# Patient Record
Sex: Female | Born: 1935 | Race: Black or African American | Hispanic: No | Smoking: Never smoker
Health system: Southern US, Community
[De-identification: ages and names within clinical notes are randomized; demographics above are authoritative.]

## PROBLEM LIST (undated history)

## (undated) DIAGNOSIS — Z5189 Encounter for other specified aftercare: Secondary | ICD-10-CM

## (undated) DIAGNOSIS — F419 Anxiety disorder, unspecified: Secondary | ICD-10-CM

## (undated) DIAGNOSIS — G459 Transient cerebral ischemic attack, unspecified: Secondary | ICD-10-CM

## (undated) DIAGNOSIS — I639 Cerebral infarction, unspecified: Secondary | ICD-10-CM

## (undated) DIAGNOSIS — IMO0001 Reserved for inherently not codable concepts without codable children: Secondary | ICD-10-CM

## (undated) DIAGNOSIS — I10 Essential (primary) hypertension: Secondary | ICD-10-CM

## (undated) DIAGNOSIS — R011 Cardiac murmur, unspecified: Secondary | ICD-10-CM

## (undated) DIAGNOSIS — R609 Edema, unspecified: Secondary | ICD-10-CM

## (undated) DIAGNOSIS — E785 Hyperlipidemia, unspecified: Secondary | ICD-10-CM

## (undated) DIAGNOSIS — M1990 Unspecified osteoarthritis, unspecified site: Secondary | ICD-10-CM

## (undated) DIAGNOSIS — F329 Major depressive disorder, single episode, unspecified: Secondary | ICD-10-CM

## (undated) DIAGNOSIS — G4730 Sleep apnea, unspecified: Secondary | ICD-10-CM

## (undated) DIAGNOSIS — F32A Depression, unspecified: Secondary | ICD-10-CM

## (undated) HISTORY — PX: NM MYOVIEW LTD: HXRAD82

## (undated) HISTORY — DX: Hyperlipidemia, unspecified: E78.5

## (undated) HISTORY — PX: ABDOMINAL HYSTERECTOMY: SHX81

## (undated) HISTORY — PX: PARTIAL HYSTERECTOMY: SHX80

## (undated) HISTORY — PX: APPENDECTOMY: SHX54

## (undated) HISTORY — DX: Edema, unspecified: R60.9

## (undated) HISTORY — PX: TRANSTHORACIC ECHOCARDIOGRAM: SHX275

---

## 1998-08-22 ENCOUNTER — Encounter: Payer: Self-pay | Admitting: Cardiovascular Disease

## 1998-08-22 ENCOUNTER — Inpatient Hospital Stay (HOSPITAL_COMMUNITY): Admission: EM | Admit: 1998-08-22 | Discharge: 1998-08-24 | Payer: Self-pay | Admitting: Emergency Medicine

## 1999-03-22 ENCOUNTER — Ambulatory Visit (HOSPITAL_COMMUNITY): Admission: RE | Admit: 1999-03-22 | Discharge: 1999-03-22 | Payer: Self-pay | Admitting: Gastroenterology

## 1999-04-18 ENCOUNTER — Other Ambulatory Visit: Admission: RE | Admit: 1999-04-18 | Discharge: 1999-04-18 | Payer: Self-pay | Admitting: Obstetrics and Gynecology

## 1999-10-05 ENCOUNTER — Ambulatory Visit (HOSPITAL_COMMUNITY): Admission: RE | Admit: 1999-10-05 | Discharge: 1999-10-05 | Payer: Self-pay | Admitting: Gastroenterology

## 2000-01-04 ENCOUNTER — Encounter: Payer: Self-pay | Admitting: Internal Medicine

## 2000-01-04 ENCOUNTER — Encounter: Admission: RE | Admit: 2000-01-04 | Discharge: 2000-01-04 | Payer: Self-pay | Admitting: Internal Medicine

## 2000-01-07 ENCOUNTER — Emergency Department (HOSPITAL_COMMUNITY): Admission: EM | Admit: 2000-01-07 | Discharge: 2000-01-07 | Payer: Self-pay | Admitting: Emergency Medicine

## 2000-06-06 ENCOUNTER — Emergency Department (HOSPITAL_COMMUNITY): Admission: EM | Admit: 2000-06-06 | Discharge: 2000-06-06 | Payer: Self-pay | Admitting: Emergency Medicine

## 2001-01-31 ENCOUNTER — Encounter: Payer: Self-pay | Admitting: Internal Medicine

## 2001-01-31 ENCOUNTER — Encounter: Admission: RE | Admit: 2001-01-31 | Discharge: 2001-01-31 | Payer: Self-pay | Admitting: Internal Medicine

## 2001-05-02 ENCOUNTER — Ambulatory Visit (HOSPITAL_COMMUNITY): Admission: RE | Admit: 2001-05-02 | Discharge: 2001-05-02 | Payer: Self-pay | Admitting: Internal Medicine

## 2001-09-06 ENCOUNTER — Emergency Department (HOSPITAL_COMMUNITY): Admission: EM | Admit: 2001-09-06 | Discharge: 2001-09-06 | Payer: Self-pay | Admitting: Emergency Medicine

## 2001-09-06 ENCOUNTER — Encounter: Payer: Self-pay | Admitting: Emergency Medicine

## 2002-02-06 ENCOUNTER — Encounter: Payer: Self-pay | Admitting: Internal Medicine

## 2002-02-06 ENCOUNTER — Encounter: Admission: RE | Admit: 2002-02-06 | Discharge: 2002-02-06 | Payer: Self-pay | Admitting: Internal Medicine

## 2003-02-16 ENCOUNTER — Encounter: Payer: Self-pay | Admitting: Internal Medicine

## 2003-02-16 ENCOUNTER — Encounter: Admission: RE | Admit: 2003-02-16 | Discharge: 2003-02-16 | Payer: Self-pay | Admitting: Internal Medicine

## 2004-02-21 ENCOUNTER — Emergency Department (HOSPITAL_COMMUNITY): Admission: EM | Admit: 2004-02-21 | Discharge: 2004-02-21 | Payer: Self-pay | Admitting: Emergency Medicine

## 2004-04-24 ENCOUNTER — Encounter: Admission: RE | Admit: 2004-04-24 | Discharge: 2004-04-24 | Payer: Self-pay | Admitting: Internal Medicine

## 2004-07-17 ENCOUNTER — Encounter: Admission: RE | Admit: 2004-07-17 | Discharge: 2004-07-17 | Payer: Self-pay | Admitting: Internal Medicine

## 2005-09-07 ENCOUNTER — Encounter: Admission: RE | Admit: 2005-09-07 | Discharge: 2005-09-07 | Payer: Self-pay | Admitting: Internal Medicine

## 2005-09-26 ENCOUNTER — Encounter: Admission: RE | Admit: 2005-09-26 | Discharge: 2005-09-26 | Payer: Self-pay | Admitting: Internal Medicine

## 2005-09-28 ENCOUNTER — Emergency Department (HOSPITAL_COMMUNITY): Admission: EM | Admit: 2005-09-28 | Discharge: 2005-09-29 | Payer: Self-pay | Admitting: Emergency Medicine

## 2005-12-20 ENCOUNTER — Encounter: Admission: RE | Admit: 2005-12-20 | Discharge: 2005-12-20 | Payer: Self-pay | Admitting: Internal Medicine

## 2006-02-15 ENCOUNTER — Encounter (INDEPENDENT_AMBULATORY_CARE_PROVIDER_SITE_OTHER): Payer: Self-pay | Admitting: *Deleted

## 2006-02-15 ENCOUNTER — Ambulatory Visit (HOSPITAL_COMMUNITY): Admission: RE | Admit: 2006-02-15 | Discharge: 2006-02-15 | Payer: Self-pay | Admitting: Gastroenterology

## 2006-04-04 ENCOUNTER — Encounter: Admission: RE | Admit: 2006-04-04 | Discharge: 2006-04-04 | Payer: Self-pay | Admitting: Internal Medicine

## 2006-11-04 ENCOUNTER — Inpatient Hospital Stay (HOSPITAL_COMMUNITY): Admission: EM | Admit: 2006-11-04 | Discharge: 2006-11-06 | Payer: Self-pay | Admitting: Emergency Medicine

## 2007-07-30 ENCOUNTER — Encounter: Admission: RE | Admit: 2007-07-30 | Discharge: 2007-07-30 | Payer: Self-pay | Admitting: Internal Medicine

## 2008-08-04 ENCOUNTER — Encounter: Admission: RE | Admit: 2008-08-04 | Discharge: 2008-08-04 | Payer: Self-pay | Admitting: Internal Medicine

## 2008-10-25 ENCOUNTER — Encounter: Admission: RE | Admit: 2008-10-25 | Discharge: 2008-10-25 | Payer: Self-pay | Admitting: Internal Medicine

## 2008-11-15 ENCOUNTER — Encounter: Admission: RE | Admit: 2008-11-15 | Discharge: 2008-11-15 | Payer: Self-pay | Admitting: Neurology

## 2008-11-26 ENCOUNTER — Encounter: Admission: RE | Admit: 2008-11-26 | Discharge: 2008-11-26 | Payer: Self-pay | Admitting: Internal Medicine

## 2009-07-26 ENCOUNTER — Encounter: Admission: RE | Admit: 2009-07-26 | Discharge: 2009-07-26 | Payer: Self-pay | Admitting: Neurology

## 2009-08-08 ENCOUNTER — Encounter: Admission: RE | Admit: 2009-08-08 | Discharge: 2009-08-08 | Payer: Self-pay | Admitting: Internal Medicine

## 2009-12-19 ENCOUNTER — Encounter: Admission: RE | Admit: 2009-12-19 | Discharge: 2009-12-19 | Payer: Self-pay | Admitting: Internal Medicine

## 2010-08-09 ENCOUNTER — Encounter: Admission: RE | Admit: 2010-08-09 | Discharge: 2010-08-09 | Payer: Self-pay | Admitting: Internal Medicine

## 2010-08-17 ENCOUNTER — Inpatient Hospital Stay (HOSPITAL_COMMUNITY): Admission: EM | Admit: 2010-08-17 | Discharge: 2010-02-09 | Payer: Self-pay | Admitting: Emergency Medicine

## 2010-11-06 ENCOUNTER — Other Ambulatory Visit: Payer: Self-pay | Admitting: Neurology

## 2010-11-06 DIAGNOSIS — R0989 Other specified symptoms and signs involving the circulatory and respiratory systems: Secondary | ICD-10-CM

## 2010-11-07 ENCOUNTER — Ambulatory Visit
Admission: RE | Admit: 2010-11-07 | Discharge: 2010-11-07 | Disposition: A | Discharge status: Home / Self Care | Payer: Medicare Other | Source: Ambulatory Visit | Attending: Cardiovascular Disease | Admitting: Cardiovascular Disease

## 2010-11-07 ENCOUNTER — Other Ambulatory Visit: Payer: Self-pay | Admitting: Cardiovascular Disease

## 2010-11-07 DIAGNOSIS — R0602 Shortness of breath: Secondary | ICD-10-CM

## 2010-11-07 DIAGNOSIS — Z01811 Encounter for preprocedural respiratory examination: Secondary | ICD-10-CM

## 2010-11-08 ENCOUNTER — Observation Stay (HOSPITAL_COMMUNITY)
Admission: RE | Admit: 2010-11-08 | Discharge: 2010-11-09 | Disposition: A | Discharge status: Home / Self Care | Payer: Medicare Other | Source: Ambulatory Visit | Attending: Cardiovascular Disease | Admitting: Cardiovascular Disease

## 2010-11-08 DIAGNOSIS — I10 Essential (primary) hypertension: Secondary | ICD-10-CM | POA: Insufficient documentation

## 2010-11-08 DIAGNOSIS — G4733 Obstructive sleep apnea (adult) (pediatric): Secondary | ICD-10-CM | POA: Insufficient documentation

## 2010-11-08 DIAGNOSIS — E119 Type 2 diabetes mellitus without complications: Secondary | ICD-10-CM | POA: Insufficient documentation

## 2010-11-08 DIAGNOSIS — R0989 Other specified symptoms and signs involving the circulatory and respiratory systems: Secondary | ICD-10-CM | POA: Insufficient documentation

## 2010-11-08 DIAGNOSIS — R0602 Shortness of breath: Secondary | ICD-10-CM | POA: Insufficient documentation

## 2010-11-08 DIAGNOSIS — E785 Hyperlipidemia, unspecified: Secondary | ICD-10-CM | POA: Insufficient documentation

## 2010-11-08 DIAGNOSIS — R0789 Other chest pain: Principal | ICD-10-CM | POA: Insufficient documentation

## 2010-11-08 DIAGNOSIS — R0609 Other forms of dyspnea: Secondary | ICD-10-CM | POA: Insufficient documentation

## 2010-11-08 LAB — GLUCOSE, CAPILLARY
Glucose-Capillary: 118 mg/dL — ABNORMAL HIGH (ref 70–99)
Glucose-Capillary: 84 mg/dL (ref 70–99)
Glucose-Capillary: 103 mg/dL — ABNORMAL HIGH (ref 70–99)
Glucose-Capillary: 197 mg/dL — ABNORMAL HIGH (ref 70–99)

## 2010-11-09 ENCOUNTER — Other Ambulatory Visit: Payer: Self-pay

## 2010-11-09 LAB — GLUCOSE, CAPILLARY: Glucose-Capillary: 157 mg/dL — ABNORMAL HIGH (ref 70–99)

## 2010-11-22 NOTE — Procedures (Signed)
NAME:  [name], Maria              ACCOUNT NO.:  [id number]  MEDICAL RECORD NO.:  [id number]           PATIENT TYPE:  I  LOCATION:  6525                         FACILITY:  [facility]  PHYSICIAN:  Quay Burow, M.D.   DATE OF BIRTH:  Dec 14, 1925  DATE OF PROCEDURE:  11/08/2010 DATE OF DISCHARGE:                           CARDIAC CATHETERIZATION   [name] is an 77 year old mildly overweight widowed [place] American female, mother of three, grandmother of three grandchildren who I last saw 2 weeks ago.  She has a history hypertension, hyperlipidemia, diabetes, and obstructive sleep apnea on CPAP.  She was cathed in February 2006 and found to have normal coronaries and normal LV function.  She does have chronic lower extremity pain as well as neuropathy.  Over the recent weeks, she has developed chest pain with increasing dyspnea on exertion.  Myoview stress test was completely normal.  I did add some additional medications, which were not helpful. She presents now for outpatient right and left heart cath to define anatomy and physiology and rule out ischemic etiology for her chest pain and shortness of breath.  PROCEDURE DESCRIPTION:  The patient was brought to the Second Floor [facility] Cardiac Cath Lab in a postabsorptive state.  She was premedicated with p.o. Valium, IV Versed, and fentanyl.  The right groin was prepped and shaved in usual sterile fashion.  Xylocaine 1% was used for local anesthesia.  A 5-French sheath was inserted to the right femoral artery using standard Seldinger technique.  A 7-French sheath was inserted to the right femoral vein.  A 7-French balloon-tipped thermodilution Swan-Ganz catheter was used to obtain sequential right heart pressures.  Visipaque dye was used for the entirety of the case. Retrograde aortic, left ventricular, and pullback pressures were recorded.  HEMODYNAMICS:  Aortic systolic pressure [id number], diastolic pressure 64.  Left ventricular  systolic pressure 0000000 and end-diastolic pressure of 22.  RA pressure A-wave 9, V-wave 5, mean 4.  RV pressure systolic 39, end-diastolic pressure 7.  PA pressure systolic 32, end-diastolic pressure 18.  Pulmonary capillary wedge pressure A-wave 14, V-wave 11, mean 7.  SELECTIVE CORONARY ANGIOGRAPHY: 1. Left main normal. 2. LAD normal. 3. Left circumflex normal. 4. Ramus branch was normal. 5. Right coronary artery was dominant and normal.  Left ventriculography; RAO left ventriculogram was performed using 25 mL of Visipaque dye at 12 mL per second.  The overall LVEF was estimated at greater than 60% without focal wall motion abnormalities.  IMPRESSION:  [name] has normal coronary arteries, normal LV function, and normal filling pressures.  I believe her chest pain and dyspnea are noncardiac.  Continued medical therapy will be recommended.  The sheath was removed and pressure was held in the groin to achieve hemostasis.  The patient left the lab in stable condition.  She will be gently hydrated, discharged home in the morning.  He will follow up with me in 1-2 weeks.     Quay Burow, M.D.     JB/MEDQ  D:  11/08/2010  T:  11/09/2010  Job:  [id number]  cc:   Second [facility] [place]. Baird Cancer,  M.D. [facility]  Electronically Signed by Quay Burow M.D. on 11/22/2010 01:57:57 PM

## 2010-11-22 NOTE — Discharge Summary (Signed)
  NAME:  Maria [name], Maria [name]              ACCOUNT NO.:  [id number]  MEDICAL RECORD NO.:  [id number]           PATIENT TYPE:  I  LOCATION:  [id number]                         FACILITY:  [facility]  PHYSICIAN:  Quay Burow, M.D.   DATE OF BIRTH:  06/30/1926  DATE OF ADMISSION:  11/08/2010 DATE OF DISCHARGE:  11/09/2010                              DISCHARGE SUMMARY   DISCHARGE DIAGNOSES: 1. Left arm pain, probably noncardiac. 2. Dyspnea on exertion of unclear etiology. 3. Normal coronaries and left ventricular function at catheterization     this admission. 4. Treated hypertension. 5. Treated dyslipidemia. 6. Type 2 insulin-dependent diabetes. 7. Morbid obesity. 8. Sleep apnea, on continuous positive airway pressure. 9. Chronic lower extremity edema.  HOSPITAL COURSE:  Maria [name] is a delightful 77 year old female followed by Dr. Gwenlyn Found.  She has multiple risk factors for coronary disease.  She had previously been cathed in 2006 and had normal coronaries and normal LV function.  She presented back to Dr. Gwenlyn Found on November 02, 2010, with complaints of intermittent left arm pain as well as new dyspnea on exertion.  Although the patient says the dyspnea is new, review of her records show that this has been somewhat of a chronic complaint.  Myoview stress test was normal.  She continued to be symptomatic and Dr. Gwenlyn Found decided to proceed with diagnostic catheterization.  This was done on November 08, 2010.  Coronaries were normal, LV was normal, and RA pressures were normal.  She tolerated this well, and we feel she can be discharged on November 09, 2010.  We did consider checking a D-dimer, but since her right heart pressures were normal Dr. Gwenlyn Found did not feel this was indicated.  We will see her as an outpatient.  If she continues to complain, we may need pulmonary referral and PFTs, I should note that her chest x-ray was without significant disease and her exam during her hospitalization was  normal with clear lung fields.  She had no fever, no cough.  Please see med rec for complete discharge medications.  There were no labs drawn prior to discharge.     Erlene Quan, P.A.   ______________________________ Quay Burow, M.D.    Meryl Dare  D:  11/09/2010  T:  11/09/2010  Job:  [id number]  Electronically Signed by Kerin Ransom P.A. on 11/10/2010 03:35:45 PM Electronically Signed by Quay Burow M.D. on 11/22/2010 01:57:54 PM

## 2010-11-27 ENCOUNTER — Ambulatory Visit
Admission: RE | Admit: 2010-11-27 | Discharge: 2010-11-27 | Disposition: A | Discharge status: Home / Self Care | Payer: Medicare Other | Source: Ambulatory Visit | Attending: Neurology | Admitting: Neurology

## 2010-11-27 DIAGNOSIS — R0989 Other specified symptoms and signs involving the circulatory and respiratory systems: Secondary | ICD-10-CM

## 2010-11-27 LAB — GLUCOSE, CAPILLARY
Glucose-Capillary: 138 mg/dL — ABNORMAL HIGH (ref 70–99)
Glucose-Capillary: 138 mg/dL — ABNORMAL HIGH (ref 70–99)
Glucose-Capillary: 145 mg/dL — ABNORMAL HIGH (ref 70–99)
Glucose-Capillary: 156 mg/dL — ABNORMAL HIGH (ref 70–99)
Glucose-Capillary: 183 mg/dL — ABNORMAL HIGH (ref 70–99)
Glucose-Capillary: 194 mg/dL — ABNORMAL HIGH (ref 70–99)

## 2010-11-27 LAB — CBC
HCT: 33.1 % — ABNORMAL LOW (ref 36.0–46.0)
Hemoglobin: 10.9 g/dL — ABNORMAL LOW (ref 12.0–15.0)
MCHC: 33 g/dL (ref 30.0–36.0)
MCV: 90.2 fL (ref 78.0–100.0)
Platelets: 138 10*3/uL — ABNORMAL LOW (ref 150–400)
RBC: 3.67 MIL/uL — ABNORMAL LOW (ref 3.87–5.11)
RDW: 15.2 % (ref 11.5–15.5)
WBC: 5.9 10*3/uL (ref 4.0–10.5)

## 2010-11-27 LAB — DIFFERENTIAL
Basophils Absolute: 0 10*3/uL (ref 0.0–0.1)
Basophils Relative: 1 % (ref 0–1)
Eosinophils Absolute: 0.1 10*3/uL (ref 0.0–0.7)
Eosinophils Relative: 2 % (ref 0–5)
Lymphocytes Relative: 25 % (ref 12–46)
Lymphs Abs: 1.5 10*3/uL (ref 0.7–4.0)
Monocytes Absolute: 0.5 10*3/uL (ref 0.1–1.0)
Monocytes Relative: 9 % (ref 3–12)
Neutro Abs: 3.8 10*3/uL (ref 1.7–7.7)
Neutrophils Relative %: 64 % (ref 43–77)

## 2010-11-27 LAB — BASIC METABOLIC PANEL
BUN: 13 mg/dL (ref 6–23)
BUN: 17 mg/dL (ref 6–23)
CO2: 29 mEq/L (ref 19–32)
CO2: 28 mEq/L (ref 19–32)
Calcium: 8.4 mg/dL (ref 8.4–10.5)
Calcium: 8.9 mg/dL (ref 8.4–10.5)
Chloride: 108 mEq/L (ref 96–112)
Chloride: 106 mEq/L (ref 96–112)
Creatinine, Ser: 0.77 mg/dL (ref 0.4–1.2)
Creatinine, Ser: 0.78 mg/dL (ref 0.4–1.2)
GFR calc Af Amer: >60 mL/min (ref >60)
GFR calc Af Amer: >60 mL/min (ref >60)
GFR calc non Af Amer: >60 mL/min (ref >60)
GFR calc non Af Amer: >60 mL/min (ref >60)
Glucose, Bld: 95 mg/dL (ref 70–99)
Glucose, Bld: 118 mg/dL — ABNORMAL HIGH (ref 70–99)
Potassium: 3.7 mEq/L (ref 3.5–5.1)
Potassium: 3.8 mEq/L (ref 3.5–5.1)
Sodium: 142 mEq/L (ref 135–145)
Sodium: 141 mEq/L (ref 135–145)

## 2010-11-27 LAB — URINALYSIS, ROUTINE W REFLEX MICROSCOPIC
Bilirubin Urine: NEGATIVE
Glucose, UA: NEGATIVE mg/dL
Hgb urine dipstick: NEGATIVE
Ketones, ur: NEGATIVE mg/dL
Nitrite: NEGATIVE
Protein, ur: NEGATIVE mg/dL
Specific Gravity, Urine: 1.015 (ref 1.005–1.030)
Urobilinogen, UA: 0.2 mg/dL (ref 0.0–1.0)
pH: 7.5 (ref 5.0–8.0)

## 2010-11-27 LAB — URINE MICROSCOPIC-ADD ON

## 2010-12-29 ENCOUNTER — Other Ambulatory Visit: Payer: Self-pay | Admitting: Orthopedic Surgery

## 2010-12-29 DIAGNOSIS — M79652 Pain in left thigh: Secondary | ICD-10-CM

## 2011-01-03 ENCOUNTER — Ambulatory Visit
Admission: RE | Admit: 2011-01-03 | Discharge: 2011-01-03 | Disposition: A | Discharge status: Home / Self Care | Payer: Medicare Other | Source: Ambulatory Visit | Attending: Orthopedic Surgery | Admitting: Orthopedic Surgery

## 2011-01-03 DIAGNOSIS — M79652 Pain in left thigh: Secondary | ICD-10-CM

## 2011-01-26 NOTE — H&P (Signed)
NAME:  Maria [name], Maria [name]              ACCOUNT NO.:  [id number]   MEDICAL RECORD NO.:  [id number]          PATIENT TYPE:  EMS   LOCATION:  MINO                         FACILITY:  [facility]   PHYSICIAN:  Juanito Doom, MD       DATE OF BIRTH:  October 13, 1925   DATE OF ADMISSION:  11/04/2006  DATE OF DISCHARGE:                              HISTORY & PHYSICAL   CHIEF COMPLAINT:  Swollen tongue   HISTORY OF PRESENT ILLNESS:  This is a 77 year old African-American  female patient who came in because of swollen tongue.  Tongue started  swelling this morning.  She denies any pain.  She denies any dysphagia.  The patient was on Lotrel at 10/20 mg daily for several months.  This  was increased to Lotrel 10/40 mg one tablet daily two weeks ago.  She  denies any nausea.  No vomiting, no diarrhea.  No chest pain.  No  shortness of breath.  She denies any wheezing.  She denies any dysuria.  No feet swelling.  She denies any joint pains.   PAST MEDICAL HISTORY:  Includes:  1. Diabetes mellitus, insulin dependent.  2. History of hypertension.  3. History of sleep apnea.  4. History of peripheral neuropathy secondary to diabetes mellitus.   SOCIAL HISTORY:  She lives with a friend.  No cigarette smoking.  No  alcohol use.   ALLERGIES:  SHE NOW HAS ALLERGIES TO ACE INHIBITORS.   FAMILY HISTORY:  Noncontributory.   HOME MEDICATIONS:  Include:  1. Lantus 30 units twice daily.  2. Aspirin 325 mg daily.  3. Lasix 40 mg p.o. b.i.d.  4. Lipitor 40 mg daily.  5. Metoprolol 50 mg p.o. b.i.d.  6. Potassium chloride 10 mEq p.o. b.i.d.  7. Zetia 10 mg daily.  8. Lotrel 10/40 one tablet daily at home.   PHYSICAL EXAMINATION:  GENERAL:  Shows an elderly female patient lying  on bed.  She is not in any acute distress.  HEAD AND NECK:  Shows pink conjunctivae.  She has jaundice.  Neck is  supple.  Mucous membranes are moist, thick tongue.  CHEST:  Auscultation showed clear breath sounds.  No wheezing.  No  crackles.  CARDIOVASCULAR:  Shows normal heart sounds, no murmur, and no gallop.  Pulses are palpable in her limbs.  ABDOMEN:  Soft, not tender.  No masses palpable.  The patient has normal  bowel sounds.  EXTREMITIES:  Show no edema.  CENTRAL NERVOUS SYSTEMS:  The patient is alert and oriented x3.  Speech  is clear.  Tongue is midline, but enlarged.  Power is 4/4 in all limbs.   No current CBC or BMP   ASSESSMENT:  1. Angioedema secondary to allergy to ACE inhibitor.  2. History of hypertension.  3. History of diabetes mellitus.  4. History of obstructive sleep apnea.   PLAN:  The plan is to admit patient to telemetry floor for observation.  I will discontinue Lotrel.  I will continue her other home medications.  I will also give IV Solu-Medrol 40 mg q.6 hours x24 hours and IV  Benadryl 50  mg q.8 hours x24 hours.  The patient will also use CPAP for  sleep while here at the hospital.      Juanito Doom, MD  Electronically Signed     GA/MEDQ  D:  11/04/2006  T:  11/04/2006  Job:  [id number]

## 2011-01-26 NOTE — Procedures (Signed)
[facility]  Patient:    Maria [name]                      MRN: [id number] Proc. Date: 10/05/99 Adm. Date:  [id number] Attending:  Juanita Craver CC:         Olegario Shearer, M.D.                           Procedure Report  DATE OF BIRTH:  02/13/1926  REFERRING PHYSICIAN:  Olegario Shearer, M.D.  PROCEDURE PERFORMED:  Flexible sigmoidoscopy.  ENDOSCOPIST:  Nelwyn Salisbury, M.D.  INSTRUMENT USED:  Olympus video colonoscope.  INDICATIONS:  Polypoid lesion at 30 cm, questioning whether diverticulum seen in a 77 year old white female during colonoscopy.  A repeat look is planned of this area, check for further increase in size of this lesion.  PREPROCEDURE PREPARATION:  Informed consent was procured from the patient.  The  patient was fasted for 8 hours prior to the procedure and prepped with two Fleets enemas the morning of the procedure.  PREPROCEDURE PHYSICAL:  Patient has stable vital signs.  NECK:  Supple.  CHEST:  Clear to auscultation. S1, S2 regular.  ABDOMEN:  Soft with normal abdominal bowel sounds.  DESCRIPTION OF PROCEDURE:  The patient was placed in left lateral decubitus position.  No sedation was used.  Once the patient was adequately positioned, the Olympus video colonoscope was advanced from the rectum to 40 cm with slight difficulty secondary to significant solid stool in the distal left colon. There was extensive diverticular disease seen at 30-40 cm.  No polypoid lesion or masses were recognized.  Small internal hemorrhoids were seen on retroflexion.   The patient tolerated the procedure well without complication.  IMPRESSION: 1. Very inadequate prep. 2. Extensive diverticular disease seen in left colon. No masses or polyps present. 3. Small nonbleeding internal hemorrhoids.  RECOMMENDATIONS:  Patient has been advised to follow up for repeat colonoscopy n the next five years or earlier if need be. DD:   10/05/99 TD:  10/05/99 Job: [id number]

## 2011-05-31 ENCOUNTER — Encounter (HOSPITAL_COMMUNITY): Payer: Self-pay | Admitting: Radiology

## 2011-05-31 ENCOUNTER — Inpatient Hospital Stay (HOSPITAL_COMMUNITY)
Admission: EM | Admit: 2011-05-31 | Discharge: 2011-06-05 | DRG: 312 | Disposition: A | Discharge status: Home / Self Care | Payer: Medicare Other | Source: Ambulatory Visit | Attending: Internal Medicine | Admitting: Internal Medicine

## 2011-05-31 ENCOUNTER — Observation Stay (HOSPITAL_COMMUNITY): Payer: Medicare Other

## 2011-05-31 ENCOUNTER — Emergency Department (HOSPITAL_COMMUNITY): Payer: Medicare Other

## 2011-05-31 DIAGNOSIS — F341 Dysthymic disorder: Secondary | ICD-10-CM | POA: Diagnosis present

## 2011-05-31 DIAGNOSIS — E669 Obesity, unspecified: Secondary | ICD-10-CM | POA: Diagnosis present

## 2011-05-31 DIAGNOSIS — Z794 Long term (current) use of insulin: Secondary | ICD-10-CM

## 2011-05-31 DIAGNOSIS — E7800 Pure hypercholesterolemia, unspecified: Secondary | ICD-10-CM | POA: Diagnosis present

## 2011-05-31 DIAGNOSIS — Z7982 Long term (current) use of aspirin: Secondary | ICD-10-CM

## 2011-05-31 DIAGNOSIS — F449 Dissociative and conversion disorder, unspecified: Secondary | ICD-10-CM | POA: Diagnosis present

## 2011-05-31 DIAGNOSIS — Z8673 Personal history of transient ischemic attack (TIA), and cerebral infarction without residual deficits: Secondary | ICD-10-CM

## 2011-05-31 DIAGNOSIS — I10 Essential (primary) hypertension: Secondary | ICD-10-CM | POA: Diagnosis present

## 2011-05-31 DIAGNOSIS — I951 Orthostatic hypotension: Principal | ICD-10-CM | POA: Diagnosis present

## 2011-05-31 DIAGNOSIS — Z79899 Other long term (current) drug therapy: Secondary | ICD-10-CM

## 2011-05-31 DIAGNOSIS — I498 Other specified cardiac arrhythmias: Secondary | ICD-10-CM | POA: Diagnosis present

## 2011-05-31 DIAGNOSIS — E119 Type 2 diabetes mellitus without complications: Secondary | ICD-10-CM | POA: Diagnosis present

## 2011-05-31 DIAGNOSIS — E785 Hyperlipidemia, unspecified: Secondary | ICD-10-CM | POA: Diagnosis present

## 2011-05-31 DIAGNOSIS — G4733 Obstructive sleep apnea (adult) (pediatric): Secondary | ICD-10-CM | POA: Diagnosis present

## 2011-05-31 HISTORY — DX: Transient cerebral ischemic attack, unspecified: G45.9

## 2011-05-31 HISTORY — DX: Essential (primary) hypertension: I10

## 2011-05-31 HISTORY — DX: Sleep apnea, unspecified: G47.30

## 2011-05-31 LAB — CK TOTAL AND CKMB (NOT AT ARMC)
CK, MB: 3 ng/mL (ref 0.3–4.0)
Relative Index: 1.9 (ref 0.0–2.5)
Total CK: 159 U/L (ref 7–177)

## 2011-05-31 LAB — CBC
HCT: 34.5 % — ABNORMAL LOW (ref 36.0–46.0)
Hemoglobin: 11.3 g/dL — ABNORMAL LOW (ref 12.0–15.0)
MCH: 28.9 pg (ref 26.0–34.0)
MCHC: 32.8 g/dL (ref 30.0–36.0)
MCV: 88.2 fL (ref 78.0–100.0)
Platelets: 166 10*3/uL (ref 150–400)
RBC: 3.91 MIL/uL (ref 3.87–5.11)
RDW: 15 % (ref 11.5–15.5)
WBC: 6.8 10*3/uL (ref 4.0–10.5)

## 2011-05-31 LAB — URINALYSIS, ROUTINE W REFLEX MICROSCOPIC
Bilirubin Urine: NEGATIVE
Glucose, UA: NEGATIVE mg/dL
Hgb urine dipstick: NEGATIVE
Ketones, ur: NEGATIVE mg/dL
Leukocytes, UA: NEGATIVE
Nitrite: NEGATIVE
Protein, ur: NEGATIVE mg/dL
Specific Gravity, Urine: 1.007 (ref 1.005–1.030)
Urobilinogen, UA: 0.2 mg/dL (ref 0.0–1.0)
pH: 6.5 (ref 5.0–8.0)

## 2011-05-31 LAB — DIFFERENTIAL
Basophils Absolute: 0 10*3/uL (ref 0.0–0.1)
Basophils Relative: 0 % (ref 0–1)
Eosinophils Absolute: 0.1 10*3/uL (ref 0.0–0.7)
Eosinophils Relative: 1 % (ref 0–5)
Lymphocytes Relative: 34 % (ref 12–46)
Lymphs Abs: 2.3 10*3/uL (ref 0.7–4.0)
Monocytes Absolute: 0.6 10*3/uL (ref 0.1–1.0)
Monocytes Relative: 8 % (ref 3–12)
Neutro Abs: 3.8 10*3/uL (ref 1.7–7.7)
Neutrophils Relative %: 56 % (ref 43–77)

## 2011-05-31 LAB — BASIC METABOLIC PANEL
BUN: 22 mg/dL (ref 6–23)
CO2: 32 mEq/L (ref 19–32)
Calcium: 9.5 mg/dL (ref 8.4–10.5)
Chloride: 101 mEq/L (ref 96–112)
Creatinine, Ser: 0.69 mg/dL (ref 0.50–1.10)
GFR calc Af Amer: >60 mL/min (ref >60)
GFR calc non Af Amer: >60 mL/min (ref >60)
Glucose, Bld: 96 mg/dL (ref 70–99)
Potassium: 3.8 mEq/L (ref 3.5–5.1)
Sodium: 141 mEq/L (ref 135–145)

## 2011-05-31 LAB — CARDIAC PANEL(CRET KIN+CKTOT+MB+TROPI)
CK, MB: 2.9 ng/mL (ref 0.3–4.0)
Relative Index: 1.7 (ref 0.0–2.5)
Total CK: 171 U/L (ref 7–177)
Troponin I: <0.3 ng/mL (ref <0.30)

## 2011-05-31 LAB — TROPONIN I: Troponin I: <0.3 ng/mL (ref <0.30)

## 2011-05-31 LAB — GLUCOSE, CAPILLARY
Glucose-Capillary: 92 mg/dL (ref 70–99)
Glucose-Capillary: 77 mg/dL (ref 70–99)
Glucose-Capillary: 174 mg/dL — ABNORMAL HIGH (ref 70–99)

## 2011-06-01 ENCOUNTER — Inpatient Hospital Stay (HOSPITAL_COMMUNITY): Payer: Medicare Other

## 2011-06-01 DIAGNOSIS — R569 Unspecified convulsions: Secondary | ICD-10-CM

## 2011-06-01 LAB — GLUCOSE, CAPILLARY
Glucose-Capillary: 74 mg/dL (ref 70–99)
Glucose-Capillary: 161 mg/dL — ABNORMAL HIGH (ref 70–99)
Glucose-Capillary: 73 mg/dL (ref 70–99)
Glucose-Capillary: 142 mg/dL — ABNORMAL HIGH (ref 70–99)

## 2011-06-01 LAB — CARDIAC PANEL(CRET KIN+CKTOT+MB+TROPI)
CK, MB: 2.7 ng/mL (ref 0.3–4.0)
Relative Index: 1.7 (ref 0.0–2.5)
Total CK: 163 U/L (ref 7–177)
Troponin I: <0.3 ng/mL (ref <0.30)

## 2011-06-02 LAB — GLUCOSE, CAPILLARY
Glucose-Capillary: 100 mg/dL — ABNORMAL HIGH (ref 70–99)
Glucose-Capillary: 104 mg/dL — ABNORMAL HIGH (ref 70–99)
Glucose-Capillary: 109 mg/dL — ABNORMAL HIGH (ref 70–99)
Glucose-Capillary: 103 mg/dL — ABNORMAL HIGH (ref 70–99)

## 2011-06-02 LAB — HEMOGLOBIN A1C
Hgb A1c MFr Bld: 6 % — ABNORMAL HIGH (ref <5.7)
Mean Plasma Glucose: 126 mg/dL — ABNORMAL HIGH (ref <117)

## 2011-06-02 LAB — TSH: TSH: 1.996 u[IU]/mL (ref 0.350–4.500)

## 2011-06-03 ENCOUNTER — Inpatient Hospital Stay (HOSPITAL_COMMUNITY): Payer: Medicare Other

## 2011-06-03 LAB — CBC
HCT: 34.1 % — ABNORMAL LOW (ref 36.0–46.0)
Hemoglobin: 10.7 g/dL — ABNORMAL LOW (ref 12.0–15.0)
MCH: 27.8 pg (ref 26.0–34.0)
MCHC: 31.4 g/dL (ref 30.0–36.0)
MCV: 88.6 fL (ref 78.0–100.0)
Platelets: 160 10*3/uL (ref 150–400)
RBC: 3.85 MIL/uL — ABNORMAL LOW (ref 3.87–5.11)
RDW: 15 % (ref 11.5–15.5)
WBC: 6.1 10*3/uL (ref 4.0–10.5)

## 2011-06-03 LAB — BASIC METABOLIC PANEL
BUN: 22 mg/dL (ref 6–23)
CO2: 34 mEq/L — ABNORMAL HIGH (ref 19–32)
Calcium: 9.4 mg/dL (ref 8.4–10.5)
Chloride: 102 mEq/L (ref 96–112)
Creatinine, Ser: 0.92 mg/dL (ref 0.50–1.10)
GFR calc Af Amer: >60 mL/min (ref >60)
GFR calc non Af Amer: 58 mL/min — ABNORMAL LOW (ref >60)
Glucose, Bld: 129 mg/dL — ABNORMAL HIGH (ref 70–99)
Potassium: 4.7 mEq/L (ref 3.5–5.1)
Sodium: 142 mEq/L (ref 135–145)

## 2011-06-03 LAB — GLUCOSE, CAPILLARY
Glucose-Capillary: 125 mg/dL — ABNORMAL HIGH (ref 70–99)
Glucose-Capillary: 104 mg/dL — ABNORMAL HIGH (ref 70–99)
Glucose-Capillary: 118 mg/dL — ABNORMAL HIGH (ref 70–99)
Glucose-Capillary: 136 mg/dL — ABNORMAL HIGH (ref 70–99)

## 2011-06-03 MED ORDER — GADOBENATE DIMEGLUMINE 529 MG/ML IV SOLN
18.0000 mL | Freq: Once | INTRAVENOUS | Status: AC | PRN
  Administered 2011-06-03: 18 mL via INTRAVENOUS

## 2011-06-03 NOTE — Consult Note (Signed)
NAME:  Maria [name], Maria [name]              ACCOUNT NO.:  [id number]  MEDICAL RECORD NO.:  [id number]  LOCATION:  [id number]                         FACILITY:  [facility]  PHYSICIAN:  Mali Gatha Mcnulty, MD         DATE OF BIRTH:  September 10, 1926  DATE OF CONSULTATION:  06/01/2011 DATE OF DISCHARGE:                                CONSULTATION   HISTORY OF PRESENT ILLNESS:  Asked by Dr. Zigmund Daniel to see 77 year old female secondary to syncope and bradycardia for cardiac eval.  Maria [name] has a history of normal coronary arteries, normal LV function, and normal pressures in November 08, 2010.  A cardiac cath which was done secondary to chest pain.  She has been in her usual state of health had actually seen Dr. Gwenlyn Found last week with only occasional tachycardia. But on May 31, 2011, she had breakfast, came and sat down, and felt like things grayed out and was unresponsive.  She was breathing. No chest pain.  No shortness of breath.  No palpitations.  No tachycardia, but she woke to her aide slapping her face and calling the name.  In the emergency room, she had no complaints.  No loss of bowel or bladder control with this episode as well.  Per EMS records on arrival, her blood pressure was elevated at 212/78, but pulse was 70s to 60s.  On previous EKGs in March 2012, she was sinus brady in the 40s, and today she is sinus brady in the 40s.  The patient relates to me that her heart rate was staying low, so Gerald Stabs in the pharmacist in the office titrated her off her beta blocker, and she has not had any beta blocker since August at least which would explain her tachycardia that she states she has occasionally.  Feels like her heart is racing.  Again denied any chest pain, with this in the syncopal episode.  She had tremor like movements but no tonic-clonic per EMS that visualized.  Also in the emergency room, she had another episode no arrhythmias were documented at that time.  She has had one episode here on the  floor again no arrhythmias have been documented except for the bradycardia in the 40s.  Her glucose on arrival was 95 by EMS as well.  PAST MEDICAL HISTORY:  Obstructive sleep apnea with CPAP, diabetes mellitus type 2 insulin dependent, hypertension, history of TIA, hypercholesterolemia, vertigo, and history of UTI.  ALLERGIES:  DIOVAN and ACE INHIBITORS.  FAMILY HISTORY:  Father died with prostate cancer.  Mother died from an MI.  She has two sons who died with prostate cancer.  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  OUTPATIENT MEDICATIONS: 1. Metanx 1 tablet three times a day. 2. Zetia 10 mg daily. 3. Vitamin D3 2000 units every morning. 4. Micardis 80 mg one every morning. 5. She stopped metoprolol over a month ago. 6. Amlodipine 5 mg daily. 7. Claritin 10 mg daily. 8. Lipitor 40 mg daily. 9. Lantus 12 to 20 units twice a day. 10.Klor-Con 10 mEq one tablet twice a day. 11.Imdur 30 mg daily. 12.Hydralazine 25 mg three  times a day. 13.Furosemide 40 mg twice a day. 14.Citalopram 40 mg one every morning. 15.Aspirin 325 mg daily. 16.Aricept 10 mg one daily. 17.Tylenol p.r.n.  REVIEW OF SYSTEMS:  NEURO:  As stated, but no lightheadedness or dizziness prior to the syncopal episode.  HEENT:  No blurred vision. CARDIOVASCULAR:  No chest pain.  PULMONARY:  No shortness of breath. GI:  No diarrhea, constipation, or melena.  GU:  No hematuria. MUSCULOSKELETAL:  Negative.  Please note with concerning her arrhythmia she does feel her heart race at times.  PHYSICAL EXAMINATION:  VITAL SIGNS:  On exam blood pressure 131/65 to 163/74, pulse 44, temperature 98.2, oxygen saturation on room air was 100%. GENERAL:  Alert and oriented African American female in no acute distress, pleasant affect. SKIN:  Warm and dry.  Brisk capillary refill. HEENT:  Normocephalic.  Sclerae clear. NECK:  Supple.  No JVD.  No  obvious bruits. LUNGS:  Clear without rales, rhonchi, or wheezes. HEART:  Sounds S1 and S2.  Regular rate and rhythm with 1/6 systolic murmur. ABDOMEN:  Soft, obese, nontender, positive bowel sounds.  I did not palpate liver, spleen, or masses. LOWER EXTREMITIES:  Puffy lower extremities, but no pitting edema. Pedal pulses are present. NEURO:  Alert and oriented x3.  Moves all extremities, follows commands.  CT of the head no evidence of acute intracranial abnormality.  LABORATORY DATA:  Hemoglobin 1.3, hematocrit 34.5, WBC 6.8, platelets 166, neutrophils 56-134.  Glucose has ranged from 174-73 today. Chemistry, sodium 141, potassium 3.8, chloride 101, CO2 of 32, glucose 96, BUN 22, creatinine 0.69, calcium 9.9.  Cardiac enzymes were negative.  CK ranged 159-171 with MB 3 to 2.7, troponin-I less than 0.30.  UA was clear.  EKG sinus brady without any acute changes except slow heart rate, and the patient has undergone an EEG but those results are still pending.  IMPRESSION: 1. Syncope possibly related to bradycardia versus tachycardia. 2. Hypertension by EMS [id number], systolic and [id number] in the ER     initially. 3. Obstructive sleep apnea with CPAP. 4. Obesity. 5. Patent coronary arteries. 6. Normal left ventricular function on November 08, 2010.  PLAN:  We will discontinue beta-blocker again as it obviously is slowing her down, and she states she had been off of this for over a month.  EEG is pending.  Her hypertension was accelerated on admission though unsure if that was response to the syncope.  Norvasc may need to be increased if she needs more blood pressure control.  Dr. Debara Pickett will see her.     Otilio Carpen. Dorene Ar, N.P.   ______________________________ Mali Molley Houser, MD    LRI/MEDQ  D:  06/01/2011  T:  06/01/2011  Job:  [id number]  cc:   Dr. Gwenlyn Found Dr. Tye Savoy  Electronically Signed by Cecilie Kicks N.P. on 06/03/2011 11:58:11 AM Electronically Signed by Raliegh Ip. Doyl Bitting M.D. on  06/03/2011 03:13:02 PM

## 2011-06-04 LAB — GLUCOSE, CAPILLARY
Glucose-Capillary: 144 mg/dL — ABNORMAL HIGH (ref 70–99)
Glucose-Capillary: 127 mg/dL — ABNORMAL HIGH (ref 70–99)
Glucose-Capillary: 126 mg/dL — ABNORMAL HIGH (ref 70–99)
Glucose-Capillary: 112 mg/dL — ABNORMAL HIGH (ref 70–99)

## 2011-06-04 NOTE — H&P (Signed)
NAME:  Maria [name], ESCAMILLA NO.:  [id number]  MEDICAL RECORD NO.:  [id number]  LOCATION:  [facility]                         FACILITY:  [facility]  PHYSICIAN:  Ardyth Gal, MD    DATE OF BIRTH:  [birth year]  DATE OF ADMISSION:  05/31/2011 DATE OF DISCHARGE:                             HISTORY & PHYSICAL   CHIEF COMPLAINT:  Brought by EMS because of a seizure.  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  EMS records stated the patient was trembling like tremors, but they did not weakness tonic-clonic activity.  By the time she arrived to the emergency department, she was alert and asymptomatic.  Upon questioning, the patient tells me that she was doing well, this morning she to breakfast and went to her living room, sat in the couch, and suddenly she felt like everything around her grade out.  She lost consciousness and she does not know how long this lasted.  She did not have any warning symptoms.  She denies specifically chest pain, shortness of breath, palpitations, lightheadedness, dizziness, nausea, vomiting, or diaphoresis.  She denies headaches, focal weakness, numbness, or paresthesias.  She thinks she might have taken out for about 5 minutes. She woke up when her aide was slapping on her face, calling out her name.  Then, she remembers being transported in an ambulance to the hospital.  At present, the patient feels fine with no symptoms whatsoever.  She denies any urinary or fecal incontinence.  No headaches or drowsiness.  PAST MEDICAL HISTORY:  Significant for; 1. Diabetes mellitus type 2, insulin requiring. 2. Hypertension. 3. TIA x1. 4. Sleep apnea/obesity. 5. Hypercholesterolemia. 6. Vertigo. 7. UTI. 8. Cardiac catheterization on November 08, 2010, essentially normal     (Dr. Quay Burow).  PRIMARY CARE PHYSICIAN:  Dr. Tye Savoy  CURRENT MEDICATIONS: 1. Aspirin. 2. Amlodipine. 3. Aricept. 4. Atorvastatin. 5. Celexa. 6. Claritin. 7. Hydralazine. 8. Isosorbide mononitrate. 9. Lasix. 10.Mentax. 11.Micardis. 12.Potassium. 13.Vitamin D3. 14.Zetia.  ALLERGIES:  ACE Inhibitors.  FAMILY HISTORY:  Father died with prostate cancer.  Mother died from an MI.  She has no sisters or brothers.  She had 2 sons, both deceased, one from prostate cancer.  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  REVIEW OF SYSTEMS:  See HPI.  PHYSICAL EXAMINATION:  VITAL SIGNS:  Blood pressure 159/60, pulse 60, respirations 16, and O2 sat 99%. GENERAL APPEARANCE:  The patient is a healthy-looking overweight African American woman, who is in no distress.  Very pleasant and cooperative. HEENT:  Eyes anicteric, PERRLA, and EOMI.  Nose, normal nares without drainage.  Oral cavity, normal lips and mucosa.  Tongue without lacerations. NECK:  Supple without carotid bruits.  No JVD.HEART:  Regular S1 and S2 without gallops, murmurs, or rubs. LUNGS:  Clear to auscultation. ABDOMEN:  Obese.  Normal bowel sounds.  No bruits.  Soft and nontender without organomegaly or masses palpable. EXTREMITIES:  Pitting edema 1+ bilaterally. NEUROLOGIC:  Alert and oriented x3.  Cranial nerves II through XII intact.  Motor strength 5/5 in the upper and lower extremities.  Sensory intact.  Babinski negative.  Reflexes 1+ in the patella.  LABORATORY DATA:  BMET normal.  Glucose 96.  CBC normal except for hemoglobin 11.3.  UA negative.  EKG normal sinus rhythm without ST or T- wave abnormalities.  CT scan of the head pending.  ASSESSMENT AND PLAN:  An 77 year old female with a history of hypertension, diabetes, hypercholesterolemia, and sleep apnea, presenting with one episode of loss of consciousness without fall.  IMPRESSION: 1.  Syncope.  Etiology unknown at this time.  Workup in progress.     Obtain CT of the head.  Does not appear to be an embolic phenomena.     Doubt hypoglycemia.  The patient will be admitted to telemetry for     observation.  Cycle cardiac enzymes.  Review CT before. 2. Diabetes mellitus type 2, insulin requiring.  Reduced dose of     insulin to Lantus 20 units once a day instead of     twice a day.  Monitor CBG 4 times a day. 3. Hypertension.  Continue on amlodipine and Lasix.  Obtain     orthostatic upon arrival.  DISPOSITION:  Admit to [facility] team 4.  Full code.          ______________________________ Ardyth Gal, MD     GL/MEDQ  D:  05/31/2011  T:  05/31/2011  Job:  [id number]  Electronically Signed by Ardyth Gal MD on 06/04/2011 04:19:03 PM

## 2011-06-05 LAB — CBC
HCT: 32.7 % — ABNORMAL LOW (ref 36.0–46.0)
Hemoglobin: 10.6 g/dL — ABNORMAL LOW (ref 12.0–15.0)
MCH: 28.6 pg (ref 26.0–34.0)
MCHC: 32.4 g/dL (ref 30.0–36.0)
MCV: 88.4 fL (ref 78.0–100.0)
Platelets: 161 10*3/uL (ref 150–400)
RBC: 3.7 MIL/uL — ABNORMAL LOW (ref 3.87–5.11)
RDW: 15 % (ref 11.5–15.5)
WBC: 5.3 10*3/uL (ref 4.0–10.5)

## 2011-06-05 LAB — BASIC METABOLIC PANEL
BUN: 27 mg/dL — ABNORMAL HIGH (ref 6–23)
CO2: 29 mEq/L (ref 19–32)
Calcium: 9.2 mg/dL (ref 8.4–10.5)
Chloride: 103 mEq/L (ref 96–112)
Creatinine, Ser: 0.78 mg/dL (ref 0.50–1.10)
GFR calc Af Amer: >60 mL/min (ref >60)
GFR calc non Af Amer: >60 mL/min (ref >60)
Glucose, Bld: 115 mg/dL — ABNORMAL HIGH (ref 70–99)
Potassium: 4.5 mEq/L (ref 3.5–5.1)
Sodium: 140 mEq/L (ref 135–145)

## 2011-06-05 LAB — GLUCOSE, CAPILLARY
Glucose-Capillary: 137 mg/dL — ABNORMAL HIGH (ref 70–99)
Glucose-Capillary: 155 mg/dL — ABNORMAL HIGH (ref 70–99)

## 2011-06-05 NOTE — Procedures (Signed)
EEG NUMBER:  [id number].  This routine EEG was requested in this 77 year old woman who has had a history of possible seizure activity.  She has been having recent spells of staring with shaking of the entire body.  Medications include Aricept, Ambien, and citalopram.  The EEG was done with the patient awake and drowsy.  During periods of maximal wakefulness, there is no obvious posterior lead dominant rhythm. Background activities were characterized by low-amplitude beta activities that were symmetric.  Photic stimulation produced a symmetric driving response.  Hyperventilation was not performed.  The patient did become intermittently drowsy as evidenced by attenuation of muscle activity and slight slowing of background activities.  EKG was notable for sinus bradycardia.  CLINICAL INTERPRETATION:  This routine EEG done with the patient awake and drowsy is essentially normal.  The absence of an alpha rhythm can sometimes be a normal finding.  It is sometimes seen in patients who have the inability to relax.  It should be noted that a sinus bradycardia was noted on the EKG tracing.          ______________________________ Neena Rhymes, MD    [id number] D:  06/01/2011 17:28:20  T:  06/02/2011 00:27:29  Job #:  [id number]

## 2011-06-24 NOTE — Consult Note (Signed)
NAME:  Maria [name], Maria [name]              ACCOUNT NO.:  [id number]  MEDICAL RECORD NO.:  [id number]  LOCATION:                                 FACILITY:  PHYSICIAN:  Rogue Jury, MD   DATE OF BIRTH:  1926-05-27  DATE OF CONSULTATION: DATE OF DISCHARGE:                                CONSULTATION   REQUESTING PHYSICIAN:  [facility].  REASON FOR CONSULT:  Questionable seizures.  HISTORY OF PRESENT ILLNESS:  The patient is an 77 year old African American female who has had several episodes suspicious for seizures. Her husband gives a detailed history about the patient as well as a detailed description from the granddaughter.  According to the husband, the first event he saw prior to this admission was when they were sitting at the kitchen table and talking normally.  He then noticed that the patient was having abduction and adduction movements of her legs and slowly became faster and faster and then she started staring.  Her arms started going up in the air bilaterally and she had some tremulousness involving her arms.  She was not responding to him and it lasted for about 20 seconds.  It stopped within a few minutes.  She went into a second one that was similar.  At that point, he called the EMS who arrived and by that time she was back to her baseline without any confusion.  There was no urinary incontinence or tongue biting.  There was no foaming at the mouth and no strange breathing noises.  There were no automatisms involving the orolingual area.  She was brought over to the hospital at which point the granddaughter witnessed a couple of other brief periods of staring and unresponsiveness in the ER.  Her husband says that this happened 1 year ago as well and they never went to the hospital.  It was similar in appearance.  Upon arrival here, a CT scan of the brain was obtained which I reviewed personally indicates no acute hemorrhages.  The ventricles are normal  in size.  There is evidence of old bilateral basal ganglia infarcts.  Her CBC is normal except for the hemoglobin of 10.7.  Basic metabolic panel is normal including a calcium of 9.4.  TSH and cardiac enzymes are normal. Urinalysis is negative.  The patient is on Aricept, but claims that she does not have a diagnosis of Alzheimer disease.  Her doctor is using that to help her memory since she has had some short-term and long-term memory disturbances.  She does have a history of significant depression and anxiety.  In fact according to the husband, during the first episode, the patient also was crying relentlessly.  The patient says that she feels that she is very depressed and anxious and thinks that this is likely due to the fact that she lost her son to prostate cancer and her granddaughter who was in her 48s to cervical cancer within the last year.  The patient herself does all the accounting and checkbook balancing and paying the bills without any errors at all.  She also continues to cook properly and is able to do her activities of dailyliving in a normal  manner without any disturbance.  PAST MEDICAL HISTORY: 1. Hypertension. 2. Depression. 3. Anxiety. 4. Hyperlipidemia. 5. Diabetes. 6. Ischemic stroke.  PAST SURGICAL HISTORY:  Negative.  CURRENT MEDICATIONS: 1. Norvasc 2.5 mg daily. 2. Aspirin 325 mg daily. 3. Celexa 40 mg daily. 4. Aricept 10 mg daily. 5. Lovenox 40 mg daily. 6. Zetia 10 mg daily. 7. Lasix 40 mg t.i.d. 8. Hydralazine 25 mg t.i.d. 9. Lantus insulin 20 units nightly. 10.Isosorbide mononitrate 30 mg daily. 11.Crestor 20 mg daily. 12.Telmisartan 80 mg daily.  ALLERGIES:  ACE INHIBITORS.  SOCIAL HISTORY:  The patient is a retired Research scientist (physical sciences).  No alcohol, tobacco, or illicit drug use.  FAMILY HISTORY:  Positive for prostate cancer and myocardial infarction.  REVIEW OF SYSTEMS:  No fever.  No meningismus.  No diplopia.  No dysphagia.  No chest  pain.  No shortness of breath.  No diarrhea or constipation.  All review of systems are negative.  PHYSICAL EXAMINATION:  VITAL SIGNS:  Blood pressure is 138/75.  Pulse of 59.  Her heart rate did drop as low as 48.  Temperature 97.8. HEART:  Regular rate and rhythm.  No murmurs. LUNGS:  Clear.  There are no carotid bruits. NEUROLOGIC:  She is awake and alert, somewhat of a flat affect.  She is fully oriented.  Language is fluent.  Comprehension, naming, and repetition are intact.  Pupils are equal and reactive.  Extraocular movements are intact.  Face is symmetrical.  Tongue is midline. Strength is 5/5 bilaterally in upper and lower extremities and all muscle groups.  Reflexes are +2 and symmetrical.  Sensation is intact to all modalities.  Coordination is fully intact.  There is no Babinski sign.  There is no Hoffman sign.  There are no skin rashes.  Gait is mildly antalgic due to the fact that she has significant osteoarthritis of her knee with leg giving out in the past.  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  The description of the event as well as the underlying severe depression and anxiety is leading me to believe that there is a significant chance that these are psychogenic in nature.  I cannot find any obvious provocative factor for true epileptic seizure.  She has not had any recent cortical stroke or old cortical stroke.  There is no direct evidence of neurological exam that she has had a brain tumor, although that cannot be completely ruled out with a CT scan and an MRI with and without gadolinium may be useful for that purpose.  She does not have any obvious electrolyte abnormalities.  There have been no signs of central nervous system infection.  She has had no posttraumatic concussion.  There is no obvious metabolic disturbance that may cause her to have  seizures.  She has not had any obvious drug ingestion or drug withdrawal as the cause of the seizures.  Dementia would be a risk factor, however, based on the history, it does not appear that she has underlying dementia.  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  At this point, I will order an EEG as well to ensure that she does not have any seizure tendency.  If she has an event during the EEG, then of course the diagnosis would be much easier and more accurate to make.  However, without that, it is still not 100% sense of this, but is still able to give [name]  some information about whether she has underlying seizure tendency or not.          ______________________________ Rogue Jury, MD     SE/MEDQ  D:  06/03/2011  T:  06/03/2011  Job:  [id number]  Electronically Signed by Rogue Jury MD on 06/24/2011 11:26:32 PM

## 2011-07-02 ENCOUNTER — Other Ambulatory Visit: Payer: Self-pay | Admitting: Internal Medicine

## 2011-07-02 DIAGNOSIS — Z1231 Encounter for screening mammogram for malignant neoplasm of breast: Secondary | ICD-10-CM

## 2011-08-14 ENCOUNTER — Ambulatory Visit
Admission: RE | Admit: 2011-08-14 | Discharge: 2011-08-14 | Disposition: A | Discharge status: Home / Self Care | Payer: Medicare Other | Source: Ambulatory Visit | Attending: Internal Medicine | Admitting: Internal Medicine

## 2011-08-14 DIAGNOSIS — Z1231 Encounter for screening mammogram for malignant neoplasm of breast: Secondary | ICD-10-CM

## 2011-12-26 ENCOUNTER — Ambulatory Visit (INDEPENDENT_AMBULATORY_CARE_PROVIDER_SITE_OTHER): Payer: Medicare Other | Admitting: Ophthalmology

## 2011-12-26 DIAGNOSIS — E1139 Type 2 diabetes mellitus with other diabetic ophthalmic complication: Secondary | ICD-10-CM

## 2011-12-26 DIAGNOSIS — E11319 Type 2 diabetes mellitus with unspecified diabetic retinopathy without macular edema: Secondary | ICD-10-CM

## 2011-12-26 DIAGNOSIS — H43819 Vitreous degeneration, unspecified eye: Secondary | ICD-10-CM

## 2011-12-26 DIAGNOSIS — H3530 Unspecified macular degeneration: Secondary | ICD-10-CM

## 2011-12-26 DIAGNOSIS — H35039 Hypertensive retinopathy, unspecified eye: Secondary | ICD-10-CM

## 2011-12-26 DIAGNOSIS — H26499 Other secondary cataract, unspecified eye: Secondary | ICD-10-CM

## 2011-12-26 DIAGNOSIS — I10 Essential (primary) hypertension: Secondary | ICD-10-CM

## 2012-01-04 ENCOUNTER — Ambulatory Visit (INDEPENDENT_AMBULATORY_CARE_PROVIDER_SITE_OTHER): Payer: Medicare Other | Admitting: Ophthalmology

## 2012-01-04 DIAGNOSIS — H2700 Aphakia, unspecified eye: Secondary | ICD-10-CM

## 2012-01-31 ENCOUNTER — Inpatient Hospital Stay (HOSPITAL_COMMUNITY)
Admission: EM | Admit: 2012-01-31 | Discharge: 2012-02-03 | DRG: 392 | Disposition: A | Discharge status: Home Health | Payer: Medicare Other | Attending: Internal Medicine | Admitting: Internal Medicine

## 2012-01-31 ENCOUNTER — Encounter (HOSPITAL_COMMUNITY): Payer: Self-pay

## 2012-01-31 ENCOUNTER — Emergency Department (HOSPITAL_COMMUNITY): Payer: Medicare Other

## 2012-01-31 DIAGNOSIS — Z8673 Personal history of transient ischemic attack (TIA), and cerebral infarction without residual deficits: Secondary | ICD-10-CM

## 2012-01-31 DIAGNOSIS — IMO0001 Reserved for inherently not codable concepts without codable children: Secondary | ICD-10-CM

## 2012-01-31 DIAGNOSIS — K5792 Diverticulitis of intestine, part unspecified, without perforation or abscess without bleeding: Secondary | ICD-10-CM

## 2012-01-31 DIAGNOSIS — F3289 Other specified depressive episodes: Secondary | ICD-10-CM | POA: Diagnosis present

## 2012-01-31 DIAGNOSIS — R55 Syncope and collapse: Secondary | ICD-10-CM | POA: Diagnosis not present

## 2012-01-31 DIAGNOSIS — E669 Obesity, unspecified: Secondary | ICD-10-CM

## 2012-01-31 DIAGNOSIS — G4733 Obstructive sleep apnea (adult) (pediatric): Secondary | ICD-10-CM | POA: Diagnosis present

## 2012-01-31 DIAGNOSIS — Z9989 Dependence on other enabling machines and devices: Secondary | ICD-10-CM

## 2012-01-31 DIAGNOSIS — K5900 Constipation, unspecified: Secondary | ICD-10-CM | POA: Diagnosis present

## 2012-01-31 DIAGNOSIS — F329 Major depressive disorder, single episode, unspecified: Secondary | ICD-10-CM | POA: Diagnosis present

## 2012-01-31 DIAGNOSIS — Z87898 Personal history of other specified conditions: Secondary | ICD-10-CM

## 2012-01-31 DIAGNOSIS — G4730 Sleep apnea, unspecified: Secondary | ICD-10-CM

## 2012-01-31 DIAGNOSIS — E785 Hyperlipidemia, unspecified: Secondary | ICD-10-CM | POA: Diagnosis present

## 2012-01-31 DIAGNOSIS — G4710 Hypersomnia, unspecified: Secondary | ICD-10-CM

## 2012-01-31 DIAGNOSIS — I119 Hypertensive heart disease without heart failure: Secondary | ICD-10-CM

## 2012-01-31 DIAGNOSIS — E1165 Type 2 diabetes mellitus with hyperglycemia: Secondary | ICD-10-CM

## 2012-01-31 DIAGNOSIS — K5732 Diverticulitis of large intestine without perforation or abscess without bleeding: Secondary | ICD-10-CM

## 2012-01-31 DIAGNOSIS — E119 Type 2 diabetes mellitus without complications: Secondary | ICD-10-CM

## 2012-01-31 DIAGNOSIS — F411 Generalized anxiety disorder: Secondary | ICD-10-CM | POA: Diagnosis present

## 2012-01-31 DIAGNOSIS — I10 Essential (primary) hypertension: Secondary | ICD-10-CM

## 2012-01-31 DIAGNOSIS — Z794 Long term (current) use of insulin: Secondary | ICD-10-CM

## 2012-01-31 DIAGNOSIS — Z6837 Body mass index (BMI) 37.0-37.9, adult: Secondary | ICD-10-CM

## 2012-01-31 HISTORY — DX: Depression, unspecified: F32.A

## 2012-01-31 HISTORY — DX: Anxiety disorder, unspecified: F41.9

## 2012-01-31 HISTORY — DX: Reserved for inherently not codable concepts without codable children: IMO0001

## 2012-01-31 HISTORY — DX: Cardiac murmur, unspecified: R01.1

## 2012-01-31 HISTORY — DX: Unspecified osteoarthritis, unspecified site: M19.90

## 2012-01-31 HISTORY — DX: Cerebral infarction, unspecified: I63.9

## 2012-01-31 HISTORY — DX: Encounter for other specified aftercare: Z51.89

## 2012-01-31 HISTORY — DX: Major depressive disorder, single episode, unspecified: F32.9

## 2012-01-31 LAB — URINALYSIS, ROUTINE W REFLEX MICROSCOPIC
Bilirubin Urine: NEGATIVE
Glucose, UA: NEGATIVE mg/dL
Hgb urine dipstick: NEGATIVE
Ketones, ur: NEGATIVE mg/dL
Leukocytes, UA: NEGATIVE
Nitrite: NEGATIVE
Protein, ur: NEGATIVE mg/dL
Specific Gravity, Urine: 1.014 (ref 1.005–1.030)
Urobilinogen, UA: 1 mg/dL (ref 0.0–1.0)
pH: 6.5 (ref 5.0–8.0)

## 2012-01-31 LAB — CBC
HCT: 32.4 % — ABNORMAL LOW (ref 36.0–46.0)
Hemoglobin: 10.5 g/dL — ABNORMAL LOW (ref 12.0–15.0)
MCH: 28.5 pg (ref 26.0–34.0)
MCHC: 32.4 g/dL (ref 30.0–36.0)
MCV: 87.8 fL (ref 78.0–100.0)
Platelets: 154 10*3/uL (ref 150–400)
RBC: 3.69 MIL/uL — ABNORMAL LOW (ref 3.87–5.11)
RDW: 15.2 % (ref 11.5–15.5)
WBC: 8.9 10*3/uL (ref 4.0–10.5)

## 2012-01-31 LAB — DIFFERENTIAL
Basophils Absolute: 0 10*3/uL (ref 0.0–0.1)
Basophils Relative: 0 % (ref 0–1)
Eosinophils Absolute: 0 10*3/uL (ref 0.0–0.7)
Eosinophils Relative: 1 % (ref 0–5)
Lymphocytes Relative: 19 % (ref 12–46)
Lymphs Abs: 1.6 10*3/uL (ref 0.7–4.0)
Monocytes Absolute: 0.9 10*3/uL (ref 0.1–1.0)
Monocytes Relative: 10 % (ref 3–12)
Neutro Abs: 6.3 10*3/uL (ref 1.7–7.7)
Neutrophils Relative %: 71 % (ref 43–77)

## 2012-01-31 LAB — LIPASE, BLOOD: Lipase: 49 U/L (ref 11–59)

## 2012-01-31 LAB — GLUCOSE, CAPILLARY: Glucose-Capillary: 123 mg/dL — ABNORMAL HIGH (ref 70–99)

## 2012-01-31 LAB — COMPREHENSIVE METABOLIC PANEL
ALT: 20 U/L (ref 0–35)
AST: 25 U/L (ref 0–37)
Albumin: 3.7 g/dL (ref 3.5–5.2)
Alkaline Phosphatase: 71 U/L (ref 39–117)
BUN: 23 mg/dL (ref 6–23)
CO2: 31 mEq/L (ref 19–32)
Calcium: 9.3 mg/dL (ref 8.4–10.5)
Chloride: 98 mEq/L (ref 96–112)
Creatinine, Ser: 1 mg/dL (ref 0.50–1.10)
GFR calc Af Amer: 59 mL/min — ABNORMAL LOW (ref >90)
GFR calc non Af Amer: 51 mL/min — ABNORMAL LOW (ref >90)
Glucose, Bld: 135 mg/dL — ABNORMAL HIGH (ref 70–99)
Potassium: 3.7 mEq/L (ref 3.5–5.1)
Sodium: 137 mEq/L (ref 135–145)
Total Bilirubin: 0.6 mg/dL (ref 0.3–1.2)
Total Protein: 6.9 g/dL (ref 6.0–8.3)

## 2012-01-31 LAB — APTT: aPTT: 30 seconds (ref 24–37)

## 2012-01-31 LAB — LACTIC ACID, PLASMA: Lactic Acid, Venous: 1 mmol/L (ref 0.5–2.2)

## 2012-01-31 LAB — PROTIME-INR
INR: 1.01 (ref 0.00–1.49)
Prothrombin Time: 13.5 seconds (ref 11.6–15.2)

## 2012-01-31 MED ORDER — HYDROMORPHONE HCL PF 1 MG/ML IJ SOLN
0.5000 mg | Freq: Once | INTRAMUSCULAR | Status: AC
  Administered 2012-01-31: 0.5 mg via INTRAVENOUS

## 2012-01-31 MED ORDER — ONDANSETRON HCL 4 MG/2ML IJ SOLN
4.0000 mg | Freq: Once | INTRAMUSCULAR | Status: AC
  Administered 2012-01-31: 4 mg via INTRAVENOUS
  Filled 2012-01-31: qty 2

## 2012-01-31 MED ORDER — AMLODIPINE BESYLATE 5 MG PO TABS
5.0000 mg | ORAL_TABLET | Freq: Every morning | ORAL | Status: DC
Start: 2012-02-01 — End: 2012-02-03
  Administered 2012-02-01 – 2012-02-03 (×3): 5 mg via ORAL
  Filled 2012-01-31 (×3): qty 1

## 2012-01-31 MED ORDER — ONDANSETRON HCL 4 MG/2ML IJ SOLN
INTRAMUSCULAR | Status: AC
  Administered 2012-01-31: 4 mg via INTRAVENOUS
  Filled 2012-01-31: qty 2

## 2012-01-31 MED ORDER — IPRATROPIUM BROMIDE 0.02 % IN SOLN: 0.5000 mg | RESPIRATORY_TRACT | Status: DC | PRN

## 2012-01-31 MED ORDER — EZETIMIBE 10 MG PO TABS
10.0000 mg | ORAL_TABLET | Freq: Every day | ORAL | Status: DC
  Administered 2012-02-01 – 2012-02-02 (×3): 10 mg via ORAL
  Filled 2012-01-31 (×4): qty 1

## 2012-01-31 MED ORDER — ISOSORBIDE MONONITRATE ER 30 MG PO TB24
30.0000 mg | ORAL_TABLET | Freq: Every day | ORAL | Status: DC
  Administered 2012-02-01 – 2012-02-03 (×3): 30 mg via ORAL
  Filled 2012-01-31 (×3): qty 1

## 2012-01-31 MED ORDER — METRONIDAZOLE IN NACL 5-0.79 MG/ML-% IV SOLN
500.0000 mg | Freq: Three times a day (TID) | INTRAVENOUS | Status: DC
  Administered 2012-02-01 – 2012-02-03 (×7): 500 mg via INTRAVENOUS
  Filled 2012-01-31 (×11): qty 100

## 2012-01-31 MED ORDER — CIPROFLOXACIN IN D5W 400 MG/200ML IV SOLN
400.0000 mg | Freq: Two times a day (BID) | INTRAVENOUS | Status: DC
  Administered 2012-02-01 – 2012-02-03 (×5): 400 mg via INTRAVENOUS
  Filled 2012-01-31 (×7): qty 200

## 2012-01-31 MED ORDER — SODIUM CHLORIDE 0.9 % IV SOLN
Freq: Once | INTRAVENOUS | Status: AC
  Administered 2012-01-31: 17:00:00 via INTRAVENOUS

## 2012-01-31 MED ORDER — CIPROFLOXACIN IN D5W 400 MG/200ML IV SOLN
400.0000 mg | Freq: Once | INTRAVENOUS | Status: AC
  Administered 2012-01-31: 400 mg via INTRAVENOUS
  Filled 2012-01-31: qty 200

## 2012-01-31 MED ORDER — INSULIN ASPART 100 UNIT/ML ~~LOC~~ SOLN
0.0000 [IU] | Freq: Four times a day (QID) | SUBCUTANEOUS | Status: DC
  Administered 2012-02-01 (×2): 2 [IU] via SUBCUTANEOUS
  Administered 2012-02-02: 3 [IU] via SUBCUTANEOUS
  Administered 2012-02-03: 2 [IU] via SUBCUTANEOUS

## 2012-01-31 MED ORDER — ALUM & MAG HYDROXIDE-SIMETH 200-200-20 MG/5ML PO SUSP: 30.0000 mL | Freq: Four times a day (QID) | ORAL | Status: DC | PRN

## 2012-01-31 MED ORDER — ENOXAPARIN SODIUM 40 MG/0.4ML ~~LOC~~ SOLN
40.0000 mg | SUBCUTANEOUS | Status: DC
  Administered 2012-02-01 – 2012-02-03 (×3): 40 mg via SUBCUTANEOUS
  Filled 2012-01-31 (×3): qty 0.4

## 2012-01-31 MED ORDER — ATORVASTATIN CALCIUM 40 MG PO TABS
40.0000 mg | ORAL_TABLET | Freq: Every day | ORAL | Status: DC
  Administered 2012-02-01 – 2012-02-03 (×3): 40 mg via ORAL
  Filled 2012-01-31 (×3): qty 1

## 2012-01-31 MED ORDER — ONDANSETRON HCL 4 MG/2ML IJ SOLN
4.0000 mg | Freq: Once | INTRAMUSCULAR | Status: AC
  Administered 2012-01-31: 4 mg via INTRAVENOUS

## 2012-01-31 MED ORDER — ASPIRIN EC 325 MG PO TBEC
325.0000 mg | DELAYED_RELEASE_TABLET | Freq: Every day | ORAL | Status: DC
  Administered 2012-02-01 – 2012-02-02 (×3): 325 mg via ORAL
  Filled 2012-01-31 (×4): qty 1

## 2012-01-31 MED ORDER — HYDROMORPHONE HCL PF 1 MG/ML IJ SOLN
0.5000 mg | Freq: Once | INTRAMUSCULAR | Status: AC
  Administered 2012-01-31: 0.5 mg via INTRAVENOUS
  Filled 2012-01-31: qty 1

## 2012-01-31 MED ORDER — POTASSIUM CHLORIDE IN NACL 20-0.9 MEQ/L-% IV SOLN
INTRAVENOUS | Status: DC
  Administered 2012-02-01: 01:00:00 via INTRAVENOUS
  Filled 2012-01-31 (×2): qty 1000

## 2012-01-31 MED ORDER — POLYETHYLENE GLYCOL 3350 17 G PO PACK
17.0000 g | PACK | Freq: Every day | ORAL | Status: DC
  Filled 2012-01-31: qty 1

## 2012-01-31 MED ORDER — MORPHINE SULFATE 2 MG/ML IJ SOLN
2.0000 mg | INTRAMUSCULAR | Status: DC | PRN
  Administered 2012-02-01 (×3): 2 mg via INTRAVENOUS
  Filled 2012-01-31 (×3): qty 1

## 2012-01-31 MED ORDER — IRBESARTAN 150 MG PO TABS
150.0000 mg | ORAL_TABLET | Freq: Every day | ORAL | Status: DC
  Administered 2012-02-01 – 2012-02-03 (×3): 150 mg via ORAL
  Filled 2012-01-31 (×3): qty 1

## 2012-01-31 MED ORDER — ONDANSETRON HCL 4 MG PO TABS
4.0000 mg | ORAL_TABLET | Freq: Four times a day (QID) | ORAL | Status: DC | PRN
  Administered 2012-02-01: 4 mg via ORAL
  Filled 2012-01-31: qty 1

## 2012-01-31 MED ORDER — ACETAMINOPHEN 650 MG RE SUPP: 650.0000 mg | Freq: Four times a day (QID) | RECTAL | Status: DC | PRN

## 2012-01-31 MED ORDER — METRONIDAZOLE IN NACL 5-0.79 MG/ML-% IV SOLN
500.0000 mg | Freq: Once | INTRAVENOUS | Status: AC
  Administered 2012-01-31: 500 mg via INTRAVENOUS
  Filled 2012-01-31: qty 100

## 2012-01-31 MED ORDER — ONDANSETRON HCL 4 MG/2ML IJ SOLN: 4.0000 mg | Freq: Four times a day (QID) | INTRAMUSCULAR | Status: DC | PRN

## 2012-01-31 MED ORDER — FENTANYL CITRATE 0.05 MG/ML IJ SOLN
50.0000 ug | Freq: Once | INTRAMUSCULAR | Status: AC
  Administered 2012-01-31: 50 ug via INTRAVENOUS
  Filled 2012-01-31: qty 2

## 2012-01-31 MED ORDER — ALBUTEROL SULFATE (5 MG/ML) 0.5% IN NEBU: 2.5000 mg | INHALATION_SOLUTION | RESPIRATORY_TRACT | Status: DC | PRN

## 2012-01-31 MED ORDER — HYDRALAZINE HCL 25 MG PO TABS
25.0000 mg | ORAL_TABLET | Freq: Three times a day (TID) | ORAL | Status: DC
  Administered 2012-02-01 – 2012-02-03 (×9): 25 mg via ORAL
  Filled 2012-01-31 (×10): qty 1

## 2012-01-31 MED ORDER — HYDROCODONE-ACETAMINOPHEN 5-325 MG PO TABS
1.0000 | ORAL_TABLET | ORAL | Status: DC | PRN
  Administered 2012-02-02: 1 via ORAL
  Filled 2012-01-31: qty 1

## 2012-01-31 MED ORDER — FENTANYL CITRATE 0.05 MG/ML IJ SOLN
25.0000 ug | Freq: Once | INTRAMUSCULAR | Status: AC
  Administered 2012-01-31: 25 ug via INTRAVENOUS

## 2012-01-31 MED ORDER — CITALOPRAM HYDROBROMIDE 20 MG PO TABS
20.0000 mg | ORAL_TABLET | Freq: Every day | ORAL | Status: DC
  Administered 2012-02-01 – 2012-02-03 (×3): 20 mg via ORAL
  Filled 2012-01-31 (×3): qty 1

## 2012-01-31 MED ORDER — DONEPEZIL HCL 10 MG PO TABS
10.0000 mg | ORAL_TABLET | Freq: Every day | ORAL | Status: DC
  Administered 2012-02-01 – 2012-02-03 (×3): 10 mg via ORAL
  Filled 2012-01-31 (×3): qty 1

## 2012-01-31 MED ORDER — INSULIN ASPART 100 UNIT/ML ~~LOC~~ SOLN: 0.0000 [IU] | Freq: Every day | SUBCUTANEOUS | Status: DC

## 2012-01-31 MED ORDER — INSULIN GLARGINE 100 UNIT/ML ~~LOC~~ SOLN: 10.0000 [IU] | Freq: Every day | SUBCUTANEOUS | Status: DC

## 2012-01-31 MED ORDER — IOHEXOL 300 MG/ML  SOLN
100.0000 mL | Freq: Once | INTRAMUSCULAR | Status: AC | PRN
  Administered 2012-01-31: 100 mL via INTRAVENOUS

## 2012-01-31 MED ORDER — ACETAMINOPHEN 325 MG PO TABS: 650.0000 mg | ORAL_TABLET | Freq: Four times a day (QID) | ORAL | Status: DC | PRN

## 2012-01-31 NOTE — ED Notes (Signed)
Pt currently drinking PO contrast

## 2012-01-31 NOTE — ED Provider Notes (Signed)
History     CSN: [id number]  Arrival date & time 01/31/12  1614   First MD Initiated Contact with Patient 01/31/12 1618      Chief Complaint  Patient presents with  . Abdominal Pain    LLQ abd pain since yesterday - initially interm, now constant; denies n/v/d; last normal BM yesterday     (Consider location/radiation/quality/duration/timing/severity/associated sxs/prior treatment) HPI Comments: Pt reports gradual onset LLQ pain that is now persistent, waxing and waning, becomes very severe when it gets bad, no N/V, decreased appetite, no fever, chills, no consiptation, had a normal BM yesterday which didn't make pain better.  No dysuria, no back pain, doesn't radiate.  No CP, SOB.  Pt has had partial hysterectomy and appendectomy together in the past.    Patient is a 78 y.o. female presenting with abdominal pain. The history is provided by the patient.  Abdominal Pain The primary symptoms of the illness include abdominal pain. The primary symptoms of the illness do not include fever, shortness of breath, vomiting or diarrhea.  Symptoms associated with the illness do not include chills, constipation or back pain.    Past Medical History  Diagnosis Date  . Diabetes mellitus   . Hypertension   . Sleep apnea   . TIA (transient ischemic attack)     History reviewed. No pertinent past surgical history.  History reviewed. No pertinent family history.  History  Substance Use Topics  . Smoking status: Not on file  . Smokeless tobacco: Not on file  . Alcohol Use: Not on file    OB History    Grav Para Term Preterm Abortions TAB SAB Ect Mult Living                  Review of Systems  Constitutional: Positive for appetite change. Negative for fever and chills.  Respiratory: Negative for shortness of breath.   Cardiovascular: Negative for chest pain.  Gastrointestinal: Positive for abdominal pain. Negative for vomiting, diarrhea and constipation.  Musculoskeletal: Negative  for back pain.  Skin: Negative for rash.  All other systems reviewed and are negative.    Allergies  Ace inhibitors  Home Medications   Current Outpatient Rx  Name Route Sig Dispense Refill  . METANX PO Oral Take 1 capsule by mouth daily.    Marland Kitchen AMLODIPINE BESYLATE 5 MG PO TABS Oral Take 5 mg by mouth every morning.    . ASPIRIN 325 MG PO TBEC Oral Take 325 mg by mouth at bedtime.     . ATORVASTATIN CALCIUM 40 MG PO TABS Oral Take 40 mg by mouth daily.    Marland Kitchen CITALOPRAM HYDROBROMIDE 20 MG PO TABS Oral Take 20 mg by mouth daily.    . DONEPEZIL HCL 10 MG PO TABS Oral Take 10 mg by mouth daily.    Marland Kitchen EZETIMIBE 10 MG PO TABS Oral Take 10 mg by mouth at bedtime.    . FUROSEMIDE 40 MG PO TABS Oral Take 40 mg by mouth 2 (two) times daily.    Marland Kitchen HYDRALAZINE HCL 25 MG PO TABS Oral Take 25 mg by mouth 3 (three) times daily.    . INSULIN GLARGINE 100 UNIT/ML [place] SOLN Subcutaneous Inject 18 Units into the skin at bedtime.    . ISOSORBIDE MONONITRATE ER 30 MG PO TB24 Oral Take 30 mg by mouth daily.    Marland Kitchen LORATADINE 10 MG PO TABS Oral Take 10 mg by mouth every morning.    Marland Kitchen POTASSIUM CHLORIDE ER 10  MEQ PO TBCR Oral Take 10 mEq by mouth 2 (two) times daily.    . TELMISARTAN 80 MG PO TABS Oral Take 80 mg by mouth daily.    Marland Kitchen VITAMIN D (CHOLECALCIFEROL) PO Oral Take 200 Units by mouth daily.      BP 152/54  Pulse 70  Temp(Src) 99.4 F (37.4 C) (Oral)  Resp 20  SpO2 100%  Physical Exam  Nursing note and vitals reviewed. Constitutional: She appears well-developed and well-nourished. No distress.  Cardiovascular: Normal rate and regular rhythm.   Pulmonary/Chest: Effort normal and breath sounds normal.  Abdominal: Soft. Normal appearance. She exhibits distension. Bowel sounds are increased. There is no hepatosplenomegaly. There is tenderness in the left lower quadrant. There is guarding. There is no CVA tenderness.    Neurological: She is alert.  Skin: Skin is warm. She is not diaphoretic.    ED  Course  Procedures (including critical care time)  Labs Reviewed  CBC - Abnormal; Notable for the following:    RBC 3.69 (*)    Hemoglobin 10.5 (*)    HCT 32.4 (*)    All other components within normal limits  COMPREHENSIVE METABOLIC PANEL - Abnormal; Notable for the following:    Glucose, Bld 135 (*)    GFR calc non Af Amer 51 (*)    GFR calc Af Amer 59 (*)    All other components within normal limits  DIFFERENTIAL  URINALYSIS, ROUTINE W REFLEX MICROSCOPIC  LIPASE, BLOOD  APTT  PROTIME-INR  LACTIC ACID, PLASMA   Ct Abdomen Pelvis W Contrast  01/31/2012  *RADIOLOGY REPORT*  Clinical Data: Left lower quadrant abdominal pain for the past 2 days.  CT ABDOMEN AND PELVIS WITH CONTRAST  Technique:  Multidetector CT imaging of the abdomen and pelvis was performed following the standard protocol during bolus administration of intravenous contrast.  Contrast: 122mL OMNIPAQUE IOHEXOL 300 MG/ML  SOLN  Comparison: No priors.  Findings:  Lung Bases: Subsegmental atelectasis versus scarring in the lower lobes of the lungs bilaterally.  Mild cardiomegaly. There is a small hiatal hernia. Atherosclerotic calcifications in the distal descending thoracic aorta.  Abdomen/Pelvis:  In segment 6 of the liver there is a 6 mm low attenuation lesion which is too small to definitively characterize. The liver is otherwise unremarkable in appearance.  The enhanced appearance of the gallbladder, pancreas, spleen and bilateral adrenal glands is unremarkable.  There is mild bilateral perinephric stranding which is a nonspecific finding can be seen in the setting of renal insufficiency.  4 mm nonobstructive calculus in the lower pole collecting system of the right kidney.  There are some areas of parenchymal thinning in the kidneys bilaterally that could be indicative of scarring from remote infection.  There is extensive atherosclerosis throughout the abdominal and pelvic vasculature, without evidence of aneurysm or  dissection at this time.  A tiny umbilical hernia is present containing only a small amount of omental fat.  There are numerous colonic diverticulae.  Importantly, in the proximal sigmoid colon where there are several diverticulae there is profound colonic wall thickening and some surrounding inflammatory changes in the associated mesocolon, concerning for acute diverticulitis.  No well-defined fluid collection is noted at this time to suggest the presence of a diverticular abscess.  No ascites or frank pneumoperitoneum is noted to suggest the presence of a perforation.  Status post total abdominal hysterectomy and bilateral salpingo-oophorectomy.  Urinary bladder is unremarkable in appearance.  Musculoskeletal: There are no aggressive appearing lytic or blastic lesions  noted in the visualized portions of the skeleton.  The interbody cages are present at the L4-L5 interspace.  Grade 1 anterolisthesis of L4 upon L5 is noted.  IMPRESSION: 1.  Findings, as above, compatible with acute diverticulitis in the proximal sigmoid colon.  No definite evidence of diverticular abscess or frank perforation noted at this time. 2.  Severe atherosclerosis. 3.  Small hiatal hernia. 4.  Mild cardiomegaly. 5.  Additional incidental findings, as above.  These results were called by telephone on 01/31/2012  at  07:50 p.m. to  Dr. Dorna Mai, who verbally acknowledged these results.  Original Report Authenticated By: Etheleen Mayhew, M.D.   Dg Abd Acute W/chest  01/31/2012  *RADIOLOGY REPORT*  Clinical Data: 1-day history of abdominal pain.  History of diabetes hypertension.  ACUTE ABDOMEN SERIES (ABDOMEN 2 VIEW & CHEST 1 VIEW)  Comparison: No prior abdominal imaging.  Two-view chest x-ray 11/07/2010.  Findings: Nonobstructive bowel gas pattern.  Small calcification projected over the lower pole of the right kidney, likely a small calculus.  Osteopenia, degenerative changes throughout the lumbar spine, and prior L4-5 fusion with ray  cages.  Cardiac silhouette normal in size, unchanged.  Thoracic aorta mildly tortuous and atherosclerotic, unchanged.  Hilar and mediastinal contours otherwise unremarkable.  Mild biapical pleuroparenchymal scarring, unchanged.  Lungs otherwise clear.  IMPRESSION:  1.  No acute abdominal abnormality.  Large stool burden. 2.  Probable right lower pole renal calculus. 3.  No acute cardiopulmonary disease.  Original Report Authenticated By: Deniece Portela, M.D.     1. Diverticulitis     ECG at time 17:20 shows NSR at rate 62, normal axis, normal ECG.  No sig change compared to 06/02/11.  RA sat is 100% and normal.  5:30 PM Pt felt pain improvement after IV fentanyl, however drowsy, sats down to upper 80s, improved after [place] O2, arousable.  No need for reversal.  Will continue to monitor.    8:26 PM CT which I reviewed and discussed with radiologist shows sig diverticulitis without perforation.  I spoke to hospitalist who will see pt in the ED.   MDM  5:00 PM Pt is quite tender, has bowel sounds, not tympanitic.  Will treat with IV anaglesics, obtain plain film to assess for obstruction or volvulus initially due to efficiency, but also obtain CT scan if plain films are neg or noto helpful.          Saddie Benders. Dorna Mai, MD 01/31/12 2026

## 2012-01-31 NOTE — ED Notes (Signed)
Patient transported from X-ray

## 2012-01-31 NOTE — ED Notes (Signed)
Patient transported to CT 

## 2012-01-31 NOTE — ED Notes (Signed)
o2 sats 88% after adm of Fentanyl - awake, slightly sedated; o2 sats increasing with deep inspiration; placed on 2 L O2/[place]; o2 sats now holding at 99%

## 2012-01-31 NOTE — H&P (Signed)
PCP:   Maximino Greenland, MD, MD   Chief Complaint:  Left lower quadrant pain  HPI: This is a 78 year old female who stated yesterday she developed pain in her abdomen in the left lower quadrant. Initially she thought it was gas, but today the pain continued and became quite severe. Patient described the pain as crampy, intermittent, 10 out of 10 at its worse. She reports no nausea, vomiting or diarrhea. There is no radiation with the pain. She had a colonoscopy last year with Dr. Collene Mares, she states was normal with exception of some polyps. History provided the patient is alert and oriented. She states she's never had an episode of diverticulitis in the past.  Review of Systems: positives bolded   anorexia, fever, weight loss,, vision loss, decreased hearing, hoarseness, chest pain, syncope, dyspnea on exertion, peripheral edema, balance deficits, hemoptysis, abdominal pain, melena, hematochezia, severe indigestion/heartburn, hematuria, incontinence, genital sores, muscle weakness, suspicious skin lesions, transient blindness, difficulty walking, depression, unusual weight change, abnormal bleeding, enlarged lymph nodes, angioedema, and breast masses.  Past Medical History: Past Medical History  Diagnosis Date  . Diabetes mellitus   . Hypertension   . Sleep apnea     cpap  . TIA (transient ischemic attack)    Past Surgical History  Procedure Date  . Partial hysterectomy     Medications: Prior to Admission medications   Medication Sig Start Date End Date Taking? Authorizing Provider  L-Methylfolate-B6-B12 (METANX PO) Take 1 capsule by mouth daily.   Yes Historical Provider, MD  amLODipine (NORVASC) 5 MG tablet Take 5 mg by mouth every morning.    Historical Provider, MD  aspirin 325 MG EC tablet Take 325 mg by mouth at bedtime.     Historical Provider, MD  atorvastatin (LIPITOR) 40 MG tablet Take 40 mg by mouth daily.    Historical Provider, MD  citalopram (CELEXA) 20 MG tablet Take 20 mg  by mouth daily.    Historical Provider, MD  donepezil (ARICEPT) 10 MG tablet Take 10 mg by mouth daily.    Historical Provider, MD  ezetimibe (ZETIA) 10 MG tablet Take 10 mg by mouth at bedtime.    Historical Provider, MD  furosemide (LASIX) 40 MG tablet Take 40 mg by mouth 2 (two) times daily.    Historical Provider, MD  hydrALAZINE (APRESOLINE) 25 MG tablet Take 25 mg by mouth 3 (three) times daily.    Historical Provider, MD  insulin glargine (LANTUS) 100 UNIT/ML injection Inject 18 Units into the skin at bedtime.    Historical Provider, MD  isosorbide mononitrate (IMDUR) 30 MG 24 hr tablet Take 30 mg by mouth daily.    Historical Provider, MD  loratadine (CLARITIN) 10 MG tablet Take 10 mg by mouth every morning.    Historical Provider, MD  potassium chloride (K-DUR) 10 MEQ tablet Take 10 mEq by mouth 2 (two) times daily.    Historical Provider, MD  telmisartan (MICARDIS) 80 MG tablet Take 80 mg by mouth daily.    Historical Provider, MD  VITAMIN D, CHOLECALCIFEROL, PO Take 200 Units by mouth daily.    Historical Provider, MD    Allergies:   Allergies  Allergen Reactions  . Ace Inhibitors Other (See Comments)    Angioedema     Social History:  reports that she has never smoked. She does not have any smokeless tobacco history on file. She reports that she does not drink alcohol or use illicit drugs.  Family History: Family History  Problem Relation Age of  Onset  . Prostate cancer      Physical Exam: Filed Vitals:   01/31/12 1615 01/31/12 1807 01/31/12 1836  BP: 135/49 147/50 152/54  Pulse: 70    Temp: 99.4 F (37.4 C)    TempSrc: Oral    Resp:  20   SpO2: 100% 99% 100%    General:  Alert and oriented times three, well developed and nourished, no acute distress Eyes: PERRLA, pink conjunctiva, no scleral icterus ENT: Moist oral mucosa, neck supple, no thyromegaly Lungs: clear to ascultation, no wheeze, no crackles, no use of accessory muscles Cardiovascular: regular rate  and rhythm, no regurgitation, no gallops, no murmurs. No carotid bruits, no JVD Abdomen: soft, positive BS, positive tenderness to palpation left lower quadrant, obese abdomen, non-distended, unable to assess organomegaly, not an acute abdomen GU: not examined Neuro: CN II - XII grossly intact, sensation intact Musculoskeletal: strength 5/5 all extremities, no clubbing, cyanosis or edema Skin: no rash, no subcutaneous crepitation, no decubitus Psych: appropriate patient   Labs on Admission:   [facility] 01/31/12 1645  NA 137  K 3.7  CL 98  CO2 31  GLUCOSE 135*  BUN 23  CREATININE 1.00  CALCIUM 9.3  MG --  PHOS --    Basename 01/31/12 1645  AST 25  ALT 20  ALKPHOS 71  BILITOT 0.6  PROT 6.9  ALBUMIN 3.7    Basename 01/31/12 1645  LIPASE 49  AMYLASE --    Basename 01/31/12 1645  WBC 8.9  NEUTROABS 6.3  HGB 10.5*  HCT 32.4*  MCV 87.8  PLT 154   No results found for this basename: CKTOTAL:3,CKMB:3,CKMBINDEX:3,TROPONINI:3 in the last 72 hours No components found with this basename: POCBNP:3 No results found for this basename: DDIMER:2 in the last 72 hours No results found for this basename: HGBA1C:2 in the last 72 hours No results found for this basename: CHOL:2,HDL:2,LDLCALC:2,TRIG:2,CHOLHDL:2,LDLDIRECT:2 in the last 72 hours No results found for this basename: TSH,T4TOTAL,FREET3,T3FREE,THYROIDAB in the last 72 hours No results found for this basename: VITAMINB12:2,FOLATE:2,FERRITIN:2,TIBC:2,IRON:2,RETICCTPCT:2 in the last 72 hours  Micro Results: No results found for this or any previous visit (from the past 240 hour(s)). Results for Gallon, BENJAMIN REYNEN (MRN [id number]) as of 01/31/2012 22:11  Ref. Range 01/31/2012 18:25  Color, Urine Latest Range: YELLOW  YELLOW  APPearance Latest Range: CLEAR  CLEAR  Specific Gravity, Urine Latest Range: 1.005-1.030  1.014  pH Latest Range: 5.0-8.0  6.5  Glucose, UA Latest Range: NEGATIVE mg/dL NEGATIVE  Bilirubin Urine Latest  Range: NEGATIVE  NEGATIVE  Ketones, ur Latest Range: NEGATIVE mg/dL NEGATIVE  Protein Latest Range: NEGATIVE mg/dL NEGATIVE  Urobilinogen, UA Latest Range: 0.0-1.0 mg/dL 1.0  Nitrite Latest Range: NEGATIVE  NEGATIVE  Leukocytes, UA Latest Range: NEGATIVE  NEGATIVE  Hgb urine dipstick Latest Range: NEGATIVE  NEGATIVE    Radiological Exams on Admission: Ct Abdomen Pelvis W Contrast  01/31/2012  *RADIOLOGY REPORT*  Clinical Data: Left lower quadrant abdominal pain for the past 2 days.  CT ABDOMEN AND PELVIS WITH CONTRAST  Technique:  Multidetector CT imaging of the abdomen and pelvis was performed following the standard protocol during bolus administration of intravenous contrast.  Contrast: 176mL OMNIPAQUE IOHEXOL 300 MG/ML  SOLN  Comparison: No priors.  Findings:  Lung Bases: Subsegmental atelectasis versus scarring in the lower lobes of the lungs bilaterally.  Mild cardiomegaly. There is a small hiatal hernia. Atherosclerotic calcifications in the distal descending thoracic aorta.  Abdomen/Pelvis:  In segment 6 of the liver there is a  6 mm low attenuation lesion which is too small to definitively characterize. The liver is otherwise unremarkable in appearance.  The enhanced appearance of the gallbladder, pancreas, spleen and bilateral adrenal glands is unremarkable.  There is mild bilateral perinephric stranding which is a nonspecific finding can be seen in the setting of renal insufficiency.  4 mm nonobstructive calculus in the lower pole collecting system of the right kidney.  There are some areas of parenchymal thinning in the kidneys bilaterally that could be indicative of scarring from remote infection.  There is extensive atherosclerosis throughout the abdominal and pelvic vasculature, without evidence of aneurysm or dissection at this time.  A tiny umbilical hernia is present containing only a small amount of omental fat.  There are numerous colonic diverticulae.  Importantly, in the proximal  sigmoid colon where there are several diverticulae there is profound colonic wall thickening and some surrounding inflammatory changes in the associated mesocolon, concerning for acute diverticulitis.  No well-defined fluid collection is noted at this time to suggest the presence of a diverticular abscess.  No ascites or frank pneumoperitoneum is noted to suggest the presence of a perforation.  Status post total abdominal hysterectomy and bilateral salpingo-oophorectomy.  Urinary bladder is unremarkable in appearance.  Musculoskeletal: There are no aggressive appearing lytic or blastic lesions noted in the visualized portions of the skeleton.  The interbody cages are present at the L4-L5 interspace.  Grade 1 anterolisthesis of L4 upon L5 is noted.  IMPRESSION: 1.  Findings, as above, compatible with acute diverticulitis in the proximal sigmoid colon.  No definite evidence of diverticular abscess or frank perforation noted at this time. 2.  Severe atherosclerosis. 3.  Small hiatal hernia. 4.  Mild cardiomegaly. 5.  Additional incidental findings, as above.  These results were called by telephone on 01/31/2012  at  07:50 p.m. to  Dr. Dorna Mai, who verbally acknowledged these results.  Original Report Authenticated By: Etheleen Mayhew, M.D.   Dg Abd Acute W/chest  01/31/2012  *RADIOLOGY REPORT*  Clinical Data: 1-day history of abdominal pain.  History of diabetes hypertension.  ACUTE ABDOMEN SERIES (ABDOMEN 2 VIEW & CHEST 1 VIEW)  Comparison: No prior abdominal imaging.  Two-view chest x-ray 11/07/2010.  Findings: Nonobstructive bowel gas pattern.  Small calcification projected over the lower pole of the right kidney, likely a small calculus.  Osteopenia, degenerative changes throughout the lumbar spine, and prior L4-5 fusion with ray cages.  Cardiac silhouette normal in size, unchanged.  Thoracic aorta mildly tortuous and atherosclerotic, unchanged.  Hilar and mediastinal contours otherwise unremarkable.  Mild  biapical pleuroparenchymal scarring, unchanged.  Lungs otherwise clear.  IMPRESSION:  1.  No acute abdominal abnormality.  Large stool burden. 2.  Probable right lower pole renal calculus. 3.  No acute cardiopulmonary disease.  Original Report Authenticated By: Deniece Portela, M.D.    Assessment/Plan Present on Admission:  .Diverticulitis Admit to MedSurg N.p.o., IV fluid hydration, IV antibiotics Pain medications as needed. The patient has desaturated in the ER with the fentanyl and Dilaudid. She quickly rebounds on 2 L nasal cannula. Obstructive sleep apnea Morbid obesity CPAP machine ordered Diabetes mellitus Hypertension Stable resume home medications. The patient's Lantus has been decreased from 18 to 10 units subcutaneous q. asleep while she is n.p.o., sliding scale insulin every 6 hours  Full code DVT prophylaxis Team 2/Dr. Jimmie Molly, Carmelina Balducci 01/31/2012, 10:11 PM

## 2012-01-31 NOTE — ED Notes (Signed)
Adult diaper saturated with urine - diaper changed; pt tolerated procedure well

## 2012-01-31 NOTE — Progress Notes (Signed)
78yo female to begin IV ABX for diverticulitis.  Rec'd Cipro 400mg  IV x1 in ED; will continue with Cipro 400mg  IV Q12H for CrCl ~45 ml/min.  Wynona Neat, PharmD, BCPS 01/31/2012.11:56 PM

## 2012-01-31 NOTE — ED Notes (Signed)
In and out cath done for u/a; obtained approx 100 cc clear, yellow urine; specimen sent to lab for u/a

## 2012-01-31 NOTE — ED Notes (Signed)
hospitalist in to assess  

## 2012-01-31 NOTE — ED Notes (Signed)
Attempted to call report - secretary states "Davy Pique" will have to call me back; requested to speak with a charge nurse for report; secretary states charge nurse unable to receive report and that "Davy Pique" will call me back; informed secretary that I would give "sonya" 10-15 minutes then call back

## 2012-01-31 NOTE — ED Notes (Signed)
[id number] Ready

## 2012-01-31 NOTE — ED Notes (Signed)
Patient transported to x-ray. ?

## 2012-02-01 DIAGNOSIS — G4710 Hypersomnia, unspecified: Secondary | ICD-10-CM

## 2012-02-01 DIAGNOSIS — I10 Essential (primary) hypertension: Secondary | ICD-10-CM

## 2012-02-01 DIAGNOSIS — G4730 Sleep apnea, unspecified: Secondary | ICD-10-CM

## 2012-02-01 DIAGNOSIS — E1165 Type 2 diabetes mellitus with hyperglycemia: Secondary | ICD-10-CM

## 2012-02-01 DIAGNOSIS — IMO0001 Reserved for inherently not codable concepts without codable children: Secondary | ICD-10-CM

## 2012-02-01 DIAGNOSIS — K5732 Diverticulitis of large intestine without perforation or abscess without bleeding: Secondary | ICD-10-CM

## 2012-02-01 LAB — CBC
HCT: 29.5 % — ABNORMAL LOW (ref 36.0–46.0)
Hemoglobin: 9.4 g/dL — ABNORMAL LOW (ref 12.0–15.0)
MCH: 28.7 pg (ref 26.0–34.0)
MCHC: 31.9 g/dL (ref 30.0–36.0)
MCV: 90.2 fL (ref 78.0–100.0)
Platelets: 139 K/uL — ABNORMAL LOW (ref 150–400)
RBC: 3.27 MIL/uL — ABNORMAL LOW (ref 3.87–5.11)
RDW: 15.4 % (ref 11.5–15.5)
WBC: 7.3 K/uL (ref 4.0–10.5)

## 2012-02-01 LAB — BASIC METABOLIC PANEL WITH GFR
BUN: 19 mg/dL (ref 6–23)
CO2: 31 meq/L (ref 19–32)
Calcium: 8.5 mg/dL (ref 8.4–10.5)
Chloride: 102 meq/L (ref 96–112)
Creatinine, Ser: 0.99 mg/dL (ref 0.50–1.10)
GFR calc Af Amer: 59 mL/min — ABNORMAL LOW
GFR calc non Af Amer: 51 mL/min — ABNORMAL LOW
Glucose, Bld: 117 mg/dL — ABNORMAL HIGH (ref 70–99)
Potassium: 3.9 meq/L (ref 3.5–5.1)
Sodium: 139 meq/L (ref 135–145)

## 2012-02-01 LAB — GLUCOSE, CAPILLARY
Glucose-Capillary: 108 mg/dL — ABNORMAL HIGH (ref 70–99)
Glucose-Capillary: 135 mg/dL — ABNORMAL HIGH (ref 70–99)
Glucose-Capillary: 132 mg/dL — ABNORMAL HIGH (ref 70–99)
Glucose-Capillary: 129 mg/dL — ABNORMAL HIGH (ref 70–99)
Glucose-Capillary: 196 mg/dL — ABNORMAL HIGH (ref 70–99)

## 2012-02-01 MED ORDER — SENNOSIDES-DOCUSATE SODIUM 8.6-50 MG PO TABS: 2.0000 | ORAL_TABLET | Freq: Every day | ORAL | Status: DC

## 2012-02-01 MED ORDER — SODIUM CHLORIDE 0.9 % IV SOLN
INTRAVENOUS | Status: DC
  Administered 2012-02-01: 17:00:00 via INTRAVENOUS

## 2012-02-01 MED ORDER — POLYETHYLENE GLYCOL 3350 17 G PO PACK
17.0000 g | PACK | Freq: Three times a day (TID) | ORAL | Status: DC
  Administered 2012-02-01 – 2012-02-02 (×4): 17 g via ORAL
  Filled 2012-02-01 (×6): qty 1

## 2012-02-01 MED ORDER — BIOTENE DRY MOUTH MT LIQD
15.0000 mL | Freq: Two times a day (BID) | OROMUCOSAL | Status: DC
  Administered 2012-02-01 – 2012-02-03 (×5): 15 mL via OROMUCOSAL

## 2012-02-01 MED ORDER — INSULIN GLARGINE 100 UNIT/ML ~~LOC~~ SOLN
10.0000 [IU] | Freq: Every morning | SUBCUTANEOUS | Status: DC
  Administered 2012-02-02 – 2012-02-03 (×2): 10 [IU] via SUBCUTANEOUS

## 2012-02-01 MED ORDER — CHLORHEXIDINE GLUCONATE 0.12 % MT SOLN
15.0000 mL | Freq: Two times a day (BID) | OROMUCOSAL | Status: DC
  Administered 2012-02-01 – 2012-02-03 (×6): 15 mL via OROMUCOSAL
  Filled 2012-02-01 (×8): qty 15

## 2012-02-01 NOTE — Progress Notes (Signed)
Seen and examined, likely a vasovagal episode-monitor in tele.  Dr Nena Alexander

## 2012-02-01 NOTE — Progress Notes (Signed)
Patient ID: Maria [name], female   DOB: 05/18/1926, 78 y.o.   MRN: [id number] PATIENT DETAILS Name: Maria [name] Age: 78 y.o. Sex: female Date of Birth: 1926/07/29 Admit Date: 01/31/2012 [id number] N, MD, MD    Interim History:   Subjective: Still complaining of pain, but it is eased with pain medications.  Objective: Weight change:   Intake/Output Summary (Last 24 hours) at 02/01/12 1150 Last data filed at 02/01/12 0617  Gross per 24 hour  Intake 1253.34 ml  Output      0 ml  Net 1253.34 ml   Blood pressure 119/64, pulse 56, temperature 98 F (36.7 C), temperature source Oral, resp. rate 18, height 5\' 2"  (1.575 m), weight 92.4 kg (203 lb 11.3 oz), SpO2 99.00%. Filed Vitals:   01/31/12 2215 01/31/12 2338 02/01/12 0500 02/01/12 1004  BP: 116/30 138/70 113/66 119/64  Pulse:  57 56   Temp: 98.2 F (36.8 C) 97.5 F (36.4 C) 98 F (36.7 C)   TempSrc: Oral Oral Oral   Resp:  16 18   Height:  5\' 2"  (1.575 m)    Weight:  91.2 kg (201 lb 1 oz) 92.4 kg (203 lb 11.3 oz)   SpO2: 100% 98% 99%     Physical Exam: General: No acute distress, very pleasant Lungs: Clear to auscultation bilaterally without wheezes or crackles Cardiovascular: Regular rate and rhythm without murmur gallop or rub normal S1 and S2 Abdomen: Tender to palpation in LLQ, nondistended, soft, bowel sounds positive, no rebound, no ascites, no appreciable mass Extremities: No significant cyanosis, clubbing, or edema bilateral lower extremities  Basic Metabolic Panel:  Lab [id number] 0524 01/31/12 1645  NA 139 137  K 3.9 3.7  CL 102 98  CO2 31 31  GLUCOSE 117* 135*  BUN 19 23  CREATININE 0.99 1.00  CALCIUM 8.5 9.3  MG -- --  PHOS -- --   Liver Function Tests:  Lab 01/31/12 1645  AST 25  ALT 20  ALKPHOS 71  BILITOT 0.6  PROT 6.9  ALBUMIN 3.7    Lab 01/31/12 1645  LIPASE 49  AMYLASE --   CBC:  Lab 02/01/12 0524 01/31/12 1645  WBC 7.3 8.9  NEUTROABS -- 6.3  HGB 9.4* 10.5*    HCT 29.5* 32.4*  MCV 90.2 87.8  PLT 139* 154   CBG:  Lab 02/01/12 0627 01/31/12 2305  GLUCAP 108* 123*   Coagulation:  Lab 01/31/12 1645  LABPROT 13.5  INR 1.01    Studies/Results: Scheduled Meds:   . sodium chloride   Intravenous Once  . amLODipine  5 mg Oral q morning - 10a  . antiseptic oral rinse  15 mL Mouth Rinse q12n4p  . aspirin  325 mg Oral QHS  . atorvastatin  40 mg Oral Daily  . chlorhexidine  15 mL Mouth Rinse BID  . ciprofloxacin  400 mg Intravenous Once  . ciprofloxacin  400 mg Intravenous Q12H  . citalopram  20 mg Oral Daily  . donepezil  10 mg Oral Daily  . enoxaparin  40 mg Subcutaneous Q24H  . ezetimibe  10 mg Oral QHS  . fentaNYL  25 mcg Intravenous Once  . fentaNYL  50 mcg Intravenous Once  . hydrALAZINE  25 mg Oral TID  .  HYDROmorphone (DILAUDID) injection  0.5 mg Intravenous Once  .  HYDROmorphone (DILAUDID) injection  0.5 mg Intravenous Once  . insulin aspart  0-15 Units Subcutaneous Q6H  . insulin aspart  0-5 Units Subcutaneous QHS  .  insulin glargine  10 Units Subcutaneous q morning - 10a  . irbesartan  150 mg Oral Daily  . isosorbide mononitrate  30 mg Oral Daily  . metronidazole  500 mg Intravenous Once  . metronidazole  500 mg Intravenous Q8H  . ondansetron (ZOFRAN) IV  4 mg Intravenous Once  . ondansetron (ZOFRAN) IV  4 mg Intravenous Once  . polyethylene glycol  17 g Oral TID  . DISCONTD: insulin glargine  10 Units Subcutaneous QHS  . DISCONTD: polyethylene glycol  17 g Oral Daily   Continuous Infusions:   . 0.9 % NaCl with KCl 20 mEq / L 50 mL/hr at 02/01/12 0750   PRN Meds:.acetaminophen, acetaminophen, albuterol, alum & mag hydroxide-simeth, HYDROcodone-acetaminophen, iohexol, ipratropium, morphine injection, ondansetron (ZOFRAN) IV, ondansetron  Anti-infectives:  Anti-infectives     Start     Dose/Rate Route Frequency Ordered Stop   02/01/12 0800   ciprofloxacin (CIPRO) IVPB 400 mg        400 mg 200 mL/hr over 60  Minutes Intravenous Every 12 hours 01/31/12 2357     02/01/12 0600   metroNIDAZOLE (FLAGYL) IVPB 500 mg        500 mg 100 mL/hr over 60 Minutes Intravenous Every 8 hours 01/31/12 2325     01/31/12 2000   ciprofloxacin (CIPRO) IVPB 400 mg        400 mg 200 mL/hr over 60 Minutes Intravenous  Once 01/31/12 1951 01/31/12 2147   01/31/12 2000   metroNIDAZOLE (FLAGYL) IVPB 500 mg        500 mg 100 mL/hr over 60 Minutes Intravenous  Once 01/31/12 1951 01/31/12 2250          Assessment/Plan: Principal Problem:  *Diverticulitis Active Problems:  Diabetes mellitus  Hypertension  Obstructive sleep apnea on CPAP  History of TIAs  Obesity  H/O vertigo    Diverticulitis   Patient started on Cipro and Flagyl IV at the time of admission.    Starting on clear liquids.  If she can tolerate them will advance and tolerated and change medications to IV.  She will need to see Dr. Collene Mares in outpatient follow up.  Large Stool Burden on Xray.  Will give Miralax TID and Senna/S until she starts stooling well.  Pain medications as needed. The patient has desaturated in the ER with the fentanyl and Dilaudid. She quickly rebounded on 2 L nasal cannula.   Obstructive sleep apnea  Stable.  Patient refuses CPAP in the hospital.    Morbid obesity   Diabetes mellitus  CBGs well controlled on reduced dose of Lantus and SSI-Sensitive.  May need to increase lantus as she starts eating more.  Hypertension  Controlled on home medications.  DVT Prophylaxis:  Lovenox. Disposition:  Physical Therapy to evaluate, but likely discharge to home with home health.   LOS: 1 day   Melton Alar 02/01/2012, 11:50 AM [phone]  Attending I have seen and examined the patient, I agree with the assessment and plan as noted above, pain slightly better, will start clear liquids. Continue with IV Flagyl and Cipro. Rest of her medical issues seem to be stable  Dr Nena Alexander

## 2012-02-01 NOTE — Plan of Care (Signed)
Problem: Phase II Progression Outcomes Goal: Progress activity as tolerated unless otherwise ordered Outcome: Not Progressing Secondary to patient with syncopal episode while up with PT.

## 2012-02-01 NOTE — Care Management Note (Signed)
    Page 1 of 1   02/05/2012     12:51:35 PM   CARE MANAGEMENT NOTE 02/05/2012  Patient:  Maria [name], Maria [name]   Account Number:  [id number]  Date Initiated:  02/01/2012  Documentation initiated by:  Tomi Bamberger  Subjective/Objective Assessment:   dx diverticulitis  admit- lives with a companion.  Patient has an aide from [place]- Friday from 7:30 -9:30 am.     Action/Plan:   pt eval   Anticipated DC Date:  02/04/2012   Anticipated DC Plan:  [place]  CM consult      Choice offered to / List presented to:             Status of service:  Completed, signed off Medicare Important Message given?   (If response is "NO", the following Medicare IM given date fields will be blank) Date Medicare IM given:   Date Additional Medicare IM given:    Discharge Disposition:  HOME/SELF CARE  Per UR Regulation:  Reviewed for med. necessity/level of care/duration of stay  If discussed at [place] of Stay Meetings, dates discussed:    Comments:  PCP [place] Chard  02/05/12 12:49 Tomi Bamberger RN, BSN [phone] patient dc on 5/26, there is no referrals for hh .  02/01/12 15:38 Tomi Bamberger RN, BSN [phone] patient lives with a companion who is in a w/chair. Patient states she has an aide from [place] Mon-Friday from 7:30 am to 9:30 am , then the aide is with patient's companion from 9:30-11:30.  Patient states she has a rolling walker, a cane , bsc and cpap at home.  Await pt eval.

## 2012-02-01 NOTE — Evaluation (Signed)
Physical Therapy Evaluation Patient Details Name: Maria [name] MRN: [id number] DOB: 02-09-1926 Today's Date: 02/01/2012 Time: [id number] PT Time Calculation (min): 37 min  PT Assessment / Plan / Recommendation Clinical Impression  Patient is an 78 y.o female admitted due to diverticulitis.  She presents with decreased activity tolerance with likely vasovagal syncopal episode while in bathroom this afternoon, decreased balance and strength and will benefit from skilled PT to maximize independence and allow return home with aide assist.    PT Assessment  Patient needs continued PT services    Follow Up Recommendations  Home health PT    Barriers to Discharge        lEquipment Recommendations  None recommended by PT    Recommendations for Other Services     Frequency Min 3X/week    Precautions / Restrictions Precautions Precautions: Fall   Pertinent Vitals/Pain 121/63, HR 50's to 60's, SpO2 92% on room air after syncopal episode       Mobility  Bed Mobility Bed Mobility: Supine to Sit Supine to Sit: 5: Supervision Details for Bed Mobility Assistance: increased time with use of rail Transfers Transfers: Sit to Stand;Stand to Sit Sit to Stand: 4: Min guard;From bed Stand to Sit: 4: Min guard;To chair/3-in-1;With armrests Details for Transfer Assistance: sat on 3:1 over toilet Ambulation/Gait Ambulation/Gait Assistance: 4: Min guard Ambulation Distance (Feet): 10 Feet Assistive device: Rolling walker Ambulation/Gait Assistance Details: slow speed, minguard for safety Gait Pattern: Decreased stride length    Exercises     PT Diagnosis: Abnormality of gait;Generalized weakness  PT Problem List: Decreased activity tolerance;Decreased strength;Decreased mobility;Decreased balance PT Treatment Interventions: DME instruction;Gait training;Functional mobility training;Therapeutic activities;Therapeutic exercise;Balance training;Patient/family education   PT Goals Acute  Rehab PT Goals PT Goal Formulation: With patient Time For Goal Achievement: 02/08/12 Potential to Achieve Goals: Good Pt will go Sit to Stand: with modified independence PT Goal: Sit to Stand - Progress: Goal set today Pt will go Stand to Sit: with modified independence PT Goal: Stand to Sit - Progress: Goal set today Pt will Stand: with modified independence;1 - 2 min;with unilateral upper extremity support PT Goal: Stand - Progress: Goal set today Pt will Ambulate: 16 - 50 feet;with supervision;with rolling walker PT Goal: Ambulate - Progress: Goal set today  Visit Information  Last PT Received On: 02/01/12 Assistance Needed: +1    Subjective Data  Subjective: Stomach still hurts but is better than it was yesterday Patient Stated Goal: To return home   Prior [place] Lives With: Significant other Available Help at Discharge: Personal care attendant Type of Home: Apartment Home Access: Ramped entrance Home Layout: One level Bathroom Shower/Tub: Chiropodist: Standard Home Adaptive Equipment: Bedside commode/3-in-1;Tub transfer bench;Walker - rolling;Straight cane Prior Function Level of Independence: Independent with assistive device(s) Driving: No Comments: aide assists her and significant other with meal prep, transportation to store and appointments, with meds, etc. Communication Communication: No difficulties    Cognition  Overall Cognitive Status: Appears within functional limits for tasks assessed/performed Arousal/Alertness: Awake/alert Orientation Level: Appears intact for tasks assessed Behavior During Session: [facility] for tasks performed    Extremity/Trunk Assessment Right Lower Extremity Assessment RLE ROM/Strength/Tone: [facility] for tasks assessed;Unable to fully assess (due to medically unstable) Left Lower Extremity Assessment LLE ROM/Strength/Tone: [facility] for tasks assessed;Unable to fully assess (due to medically unstable)     Balance    End of Session PT - End of Session Equipment Utilized During Treatment: Gait belt Activity Tolerance: Treatment limited  secondary to medical complications (Comment) Patient left: in bed;with call bell/phone within reach;with nursing in room Nurse Communication: Mobility status (events prior to syncope)   [facility] 02/01/2012, 4:58 PM

## 2012-02-01 NOTE — Progress Notes (Signed)
Pt. evaluated for CPAP per order, pt. States, "uses at home h/s with nasal mask, could probably go without, did fine last nite without it", told to notify if wanting to have one for use this evening, plan to pass on in report to oncoming RT.

## 2012-02-01 NOTE — Progress Notes (Signed)
RN called PA -C to room as patient had a syncopal episode while having a bowel movement.   [name] had been working with PT.  Per physical therapy, The patient was having abdominal cramping.  She got up out of bed, and walked to the bathroom.  She had been on the toilet for approximately 2 min when she complained of dizziness.  PE:  Arousable but lethargic. Orientated to person and place. Face symetric CV, bradycardic (approx 50 bpm) but no m/r/g Lungs:  CTA, without accessory muscle use Abdomen:  Soft, NT, ND Neuro:  Non focal   CBG:  123 Bp:  123/63 O2 sat:  95%  Assessment:  Vasovagal episode Plan:  Placed patient on tele monitor and oxygen via nasal canula.  Will check EKG and 2D echo.  CPAP qhs.  Orthostatics tomorrow.  Give gentle fluids. Monitor Closely    Maria [name], [place] [facility] Pager: [phone] 02/01/12  2:43 pm.

## 2012-02-01 NOTE — Progress Notes (Signed)
Called by RN to check on patient, Patient had gotten up with PT and went to the bathroom where she appears to have a syncopal episode. Upon arrival to room patient still in bathroom with RN and PT assisting her back to bed.  BP 121/63 HR 60.  Patient lethargic but arouses to stimuli.  MD and NP at bedside, patient placed on tele monitor, EKG ordered.  No RRT intervention, RN to call if assistance needed

## 2012-02-01 NOTE — Progress Notes (Signed)
Pt got up to the toilet with physical therapy.  Pt was sitting on the toilet and passed out while having a BM.  Pt is sweaty and lethargic but arousable.  Imogene Burn, PA-C notified and rapid response called.  Pt put on telemetry, 2L O2 nasal cannula, and is laying in bed.  12 lead EKG ordered.  Pt's HR is in the 50s and BP is 121/63.  Will continue to monitor pt.

## 2012-02-02 DIAGNOSIS — G4730 Sleep apnea, unspecified: Secondary | ICD-10-CM

## 2012-02-02 DIAGNOSIS — IMO0001 Reserved for inherently not codable concepts without codable children: Secondary | ICD-10-CM

## 2012-02-02 DIAGNOSIS — E1165 Type 2 diabetes mellitus with hyperglycemia: Secondary | ICD-10-CM

## 2012-02-02 DIAGNOSIS — K5732 Diverticulitis of large intestine without perforation or abscess without bleeding: Secondary | ICD-10-CM

## 2012-02-02 DIAGNOSIS — G4710 Hypersomnia, unspecified: Secondary | ICD-10-CM

## 2012-02-02 DIAGNOSIS — I10 Essential (primary) hypertension: Secondary | ICD-10-CM

## 2012-02-02 LAB — COMPREHENSIVE METABOLIC PANEL
ALT: 15 U/L (ref 0–35)
AST: 19 U/L (ref 0–37)
Albumin: 3 g/dL — ABNORMAL LOW (ref 3.5–5.2)
Alkaline Phosphatase: 59 U/L (ref 39–117)
BUN: 14 mg/dL (ref 6–23)
CO2: 27 mEq/L (ref 19–32)
Calcium: 8.9 mg/dL (ref 8.4–10.5)
Chloride: 101 mEq/L (ref 96–112)
Creatinine, Ser: 1 mg/dL (ref 0.50–1.10)
GFR calc Af Amer: 59 mL/min — ABNORMAL LOW (ref >90)
GFR calc non Af Amer: 51 mL/min — ABNORMAL LOW (ref >90)
Glucose, Bld: 180 mg/dL — ABNORMAL HIGH (ref 70–99)
Potassium: 4.4 mEq/L (ref 3.5–5.1)
Sodium: 137 mEq/L (ref 135–145)
Total Bilirubin: 0.5 mg/dL (ref 0.3–1.2)
Total Protein: 6 g/dL (ref 6.0–8.3)

## 2012-02-02 LAB — CBC
HCT: 30.5 % — ABNORMAL LOW (ref 36.0–46.0)
Hemoglobin: 10 g/dL — ABNORMAL LOW (ref 12.0–15.0)
MCH: 29.7 pg (ref 26.0–34.0)
MCHC: 32.8 g/dL (ref 30.0–36.0)
MCV: 90.5 fL (ref 78.0–100.0)
Platelets: 130 10*3/uL — ABNORMAL LOW (ref 150–400)
RBC: 3.37 MIL/uL — ABNORMAL LOW (ref 3.87–5.11)
RDW: 15.1 % (ref 11.5–15.5)
WBC: 7.2 10*3/uL (ref 4.0–10.5)

## 2012-02-02 LAB — GLUCOSE, CAPILLARY
Glucose-Capillary: 133 mg/dL — ABNORMAL HIGH (ref 70–99)
Glucose-Capillary: 155 mg/dL — ABNORMAL HIGH (ref 70–99)
Glucose-Capillary: 98 mg/dL (ref 70–99)
Glucose-Capillary: 135 mg/dL — ABNORMAL HIGH (ref 70–99)

## 2012-02-02 MED ORDER — POLYETHYLENE GLYCOL 3350 17 G PO PACK
17.0000 g | PACK | Freq: Every day | ORAL | Status: DC
  Administered 2012-02-03: 17 g via ORAL
  Filled 2012-02-02: qty 1

## 2012-02-02 NOTE — Progress Notes (Signed)
  Echocardiogram 2D Echocardiogram has been performed.  Maria [name] 02/02/2012, 8:39 AM

## 2012-02-02 NOTE — Plan of Care (Signed)
Problem: Phase II Progression Outcomes Goal: Discharge plan established Outcome: Completed/Met Date Met:  02/02/12 Return home when medically cleared

## 2012-02-02 NOTE — Progress Notes (Signed)
PATIENT DETAILS Name: Maria [name] Age: 78 y.o. Sex: female Date of Birth: 03/16/1926 Admit Date: 01/31/2012 [id number] N, MD, MD  Subjective: Some pain this am-but overall better, tolerating clear liquids  Objective: Vital signs in last 24 hours: Filed Vitals:   02/02/12 0852 02/02/12 0853 02/02/12 0903 02/02/12 1335  BP: 123/70 145/73 115/67 143/67  Pulse: 63 69  60  Temp: 98.8 F (37.1 C) 98.8 F (37.1 C)  98.2 F (36.8 C)  TempSrc: Oral Oral  Oral  Resp: 18 18  20   Height:      Weight:      SpO2: 95% 94%  95%    Weight change:   Body mass index is 37.26 kg/(m^2).  Intake/Output from previous day:  Intake/Output Summary (Last 24 hours) at 02/02/12 1613 Last data filed at 02/02/12 1500  Gross per 24 hour  Intake    780 ml  Output    200 ml  Net    580 ml    PHYSICAL EXAM: Gen Exam: Awake and alert with clear speech.  Neck: Supple, No JVD.   Chest: B/L Clear.   CVS: S1 S2 Regular, no murmurs.  Abdomen: soft, BS +, mildly tender in LLQ, non distended.  Extremities: no edema, lower extremities warm to touch. Neurologic: Non Focal.   Skin: No Rash.   Wounds: N/A.    CONSULTS:  None  LAB RESULTS: CBC  Lab 02/02/12 0924 02/01/12 0524 01/31/12 1645  WBC 7.2 7.3 8.9  HGB 10.0* 9.4* 10.5*  HCT 30.5* 29.5* 32.4*  PLT 130* 139* 154  MCV 90.5 90.2 87.8  MCH 29.7 28.7 28.5  MCHC 32.8 31.9 32.4  RDW 15.1 15.4 15.2  LYMPHSABS -- -- 1.6  MONOABS -- -- 0.9  EOSABS -- -- 0.0  BASOSABS -- -- 0.0  BANDABS -- -- --    Chemistries   Lab 02/02/12 0924 02/01/12 0524 01/31/12 1645  NA 137 139 137  K 4.4 3.9 3.7  CL 101 102 98  CO2 27 31 31   GLUCOSE 180* 117* 135*  BUN 14 19 23   CREATININE 1.00 0.99 1.00  CALCIUM 8.9 8.5 9.3  MG -- -- --    GFR Estimated Creatinine Clearance: 45.1 ml/min (by C-G formula based on Cr of 1).  Coagulation profile  Lab 01/31/12 1645  INR 1.01  PROTIME --    Cardiac Enzymes No results found for this  basename: CK:3,CKMB:3,TROPONINI:3,MYOGLOBIN:3 in the last 168 hours  No components found with this basename: POCBNP:3 No results found for this basename: DDIMER:2 in the last 72 hours No results found for this basename: HGBA1C:2 in the last 72 hours No results found for this basename: CHOL:2,HDL:2,LDLCALC:2,TRIG:2,CHOLHDL:2,LDLDIRECT:2 in the last 72 hours No results found for this basename: TSH,T4TOTAL,FREET3,T3FREE,THYROIDAB in the last 72 hours No results found for this basename: VITAMINB12:2,FOLATE:2,FERRITIN:2,TIBC:2,IRON:2,RETICCTPCT:2 in the last 72 hours  Basename 01/31/12 1645  LIPASE 49  AMYLASE --    Urine Studies No results found for this basename: UACOL:2,UAPR:2,USPG:2,UPH:2,UTP:2,UGL:2,UKET:2,UBIL:2,UHGB:2,UNIT:2,UROB:2,ULEU:2,UEPI:2,UWBC:2,URBC:2,UBAC:2,CAST:2,CRYS:2,UCOM:2,BILUA:2 in the last 72 hours  MICROBIOLOGY: No results found for this or any previous visit (from the past 240 hour(s)).  RADIOLOGY STUDIES/RESULTS: Ct Abdomen Pelvis W Contrast  01/31/2012  *RADIOLOGY REPORT*  Clinical Data: Left lower quadrant abdominal pain for the past 2 days.  CT ABDOMEN AND PELVIS WITH CONTRAST  Technique:  Multidetector CT imaging of the abdomen and pelvis was performed following the standard protocol during bolus administration of intravenous contrast.  Contrast: 184mL OMNIPAQUE IOHEXOL 300 MG/ML  SOLN  Comparison: No  priors.  Findings:  Lung Bases: Subsegmental atelectasis versus scarring in the lower lobes of the lungs bilaterally.  Mild cardiomegaly. There is a small hiatal hernia. Atherosclerotic calcifications in the distal descending thoracic aorta.  Abdomen/Pelvis:  In segment 6 of the liver there is a 6 mm low attenuation lesion which is too small to definitively characterize. The liver is otherwise unremarkable in appearance.  The enhanced appearance of the gallbladder, pancreas, spleen and bilateral adrenal glands is unremarkable.  There is mild bilateral perinephric  stranding which is a nonspecific finding can be seen in the setting of renal insufficiency.  4 mm nonobstructive calculus in the lower pole collecting system of the right kidney.  There are some areas of parenchymal thinning in the kidneys bilaterally that could be indicative of scarring from remote infection.  There is extensive atherosclerosis throughout the abdominal and pelvic vasculature, without evidence of aneurysm or dissection at this time.  A tiny umbilical hernia is present containing only a small amount of omental fat.  There are numerous colonic diverticulae.  Importantly, in the proximal sigmoid colon where there are several diverticulae there is profound colonic wall thickening and some surrounding inflammatory changes in the associated mesocolon, concerning for acute diverticulitis.  No well-defined fluid collection is noted at this time to suggest the presence of a diverticular abscess.  No ascites or frank pneumoperitoneum is noted to suggest the presence of a perforation.  Status post total abdominal hysterectomy and bilateral salpingo-oophorectomy.  Urinary bladder is unremarkable in appearance.  Musculoskeletal: There are no aggressive appearing lytic or blastic lesions noted in the visualized portions of the skeleton.  The interbody cages are present at the L4-L5 interspace.  Grade 1 anterolisthesis of L4 upon L5 is noted.  IMPRESSION: 1.  Findings, as above, compatible with acute diverticulitis in the proximal sigmoid colon.  No definite evidence of diverticular abscess or frank perforation noted at this time. 2.  Severe atherosclerosis. 3.  Small hiatal hernia. 4.  Mild cardiomegaly. 5.  Additional incidental findings, as above.  These results were called by telephone on 01/31/2012  at  07:50 p.m. to  Dr. Dorna Mai, who verbally acknowledged these results.  Original Report Authenticated By: Etheleen Mayhew, M.D.   Dg Abd Acute W/chest  01/31/2012  *RADIOLOGY REPORT*  Clinical Data: 1-day  history of abdominal pain.  History of diabetes hypertension.  ACUTE ABDOMEN SERIES (ABDOMEN 2 VIEW & CHEST 1 VIEW)  Comparison: No prior abdominal imaging.  Two-view chest x-ray 11/07/2010.  Findings: Nonobstructive bowel gas pattern.  Small calcification projected over the lower pole of the right kidney, likely a small calculus.  Osteopenia, degenerative changes throughout the lumbar spine, and prior L4-5 fusion with ray cages.  Cardiac silhouette normal in size, unchanged.  Thoracic aorta mildly tortuous and atherosclerotic, unchanged.  Hilar and mediastinal contours otherwise unremarkable.  Mild biapical pleuroparenchymal scarring, unchanged.  Lungs otherwise clear.  IMPRESSION:  1.  No acute abdominal abnormality.  Large stool burden. 2.  Probable right lower pole renal calculus. 3.  No acute cardiopulmonary disease.  Original Report Authenticated By: Deniece Portela, M.D.    MEDICATIONS: Scheduled Meds:   . amLODipine  5 mg Oral q morning - 10a  . antiseptic oral rinse  15 mL Mouth Rinse q12n4p  . aspirin  325 mg Oral QHS  . atorvastatin  40 mg Oral Daily  . chlorhexidine  15 mL Mouth Rinse BID  . ciprofloxacin  400 mg Intravenous Q12H  . citalopram  20  mg Oral Daily  . donepezil  10 mg Oral Daily  . enoxaparin  40 mg Subcutaneous Q24H  . ezetimibe  10 mg Oral QHS  . hydrALAZINE  25 mg Oral TID  . insulin aspart  0-15 Units Subcutaneous Q6H  . insulin aspart  0-5 Units Subcutaneous QHS  . insulin glargine  10 Units Subcutaneous q morning - 10a  . irbesartan  150 mg Oral Daily  . isosorbide mononitrate  30 mg Oral Daily  . metronidazole  500 mg Intravenous Q8H  . polyethylene glycol  17 g Oral TID   Continuous Infusions:   . sodium chloride 50 mL/hr at 02/01/12 1725   PRN Meds:.acetaminophen, acetaminophen, albuterol, alum & mag hydroxide-simeth, HYDROcodone-acetaminophen, ipratropium, ondansetron (ZOFRAN) IV, ondansetron  Antibiotics: Anti-infectives     Start     Dose/Rate  Route Frequency Ordered Stop   02/01/12 0800   ciprofloxacin (CIPRO) IVPB 400 mg        400 mg 200 mL/hr over 60 Minutes Intravenous Every 12 hours 01/31/12 2357     02/01/12 0600   metroNIDAZOLE (FLAGYL) IVPB 500 mg        500 mg 100 mL/hr over 60 Minutes Intravenous Every 8 hours 01/31/12 2325     01/31/12 2000   ciprofloxacin (CIPRO) IVPB 400 mg        400 mg 200 mL/hr over 60 Minutes Intravenous  Once 01/31/12 1951 01/31/12 2147   01/31/12 2000   metroNIDAZOLE (FLAGYL) IVPB 500 mg        500 mg 100 mL/hr over 60 Minutes Intravenous  Once 01/31/12 1951 01/31/12 2250          Assessment/Plan: Patient Active Hospital Problem List: Diverticulitis  Patient started on Cipro and Flagyl IV at the time of admission.  Tolerating clear liquids-advance to full liquids She will need to see Dr. Collene Mares in outpatient follow up.  Large Stool Burden on Xray-multiple BM's with use of Miralax Pain medications as needed. The patient has desaturated in the ER with the fentanyl and Dilaudid. She quickly rebounded on 2 L nasal cannula.   Syncope on 5/24 -likely vaso-vagal-occurred while having a BM -Tele-unremarkable -await 2 D Echo  Obstructive sleep apnea  Stable. Patient refuses CPAP in the hospital.   Morbid obesity  -counseled extensively regarding importance of weight loss  Diabetes mellitus  CBGs well controlled on reduced dose of Lantus and SSI-Sensitive. May need to increase lantus as she starts eating more.   Hypertension  Controlled on home medications.  -continue with Amlodipine, Hydralazine,Avapro and Imdur  Dyslipidemia -continue with Zetia/Statin  Constipation -numerous BM's -change Miralax to daily  DVT Prophylaxis Prophylactic Lovenox.   Disposition:  home with home health-when ready  Code Status: Full Code  Jonetta Osgood,  MD. 02/02/2012, 4:13 PM

## 2012-02-02 NOTE — Evaluation (Signed)
Occupational Therapy Evaluation Patient Details Name: Maria [name] MRN: [id number] DOB: 09-26-1925 Today's Date: 02/02/2012 Time: [id number] OT Time Calculation (min): 26 min  OT Assessment / Plan / Recommendation Clinical Impression  This 78 year old female was admitted with diverticulitis.  Pt is currently min guard for standing for adls and for ambulating to bathroom.  Will follow in acute to reach a supervision level.  Pt will likely not need follow up OT beyond acute setting    OT Assessment  Patient needs continued OT Services    Follow Up Recommendations  No OT follow up    Barriers to Discharge      Equipment Recommendations  None recommended by OT;None recommended by PT    Recommendations for Other Services    Frequency  Min 2X/week    Precautions / Restrictions Precautions Precautions: Fall   Pertinent Vitals/Pain     ADL  Eating/Feeding: Simulated;Independent Where Assessed - Eating/Feeding: Chair Grooming: Performed;Wash/dry hands Where Assessed - Grooming: Supported sitting Upper Body Bathing: Simulated;Set up Where Assessed - Upper Body Bathing: Supported sitting Lower Body Bathing: Simulated;Min guard (for balance) Where Assessed - Lower Body Bathing: Supported sit to stand Upper Body Dressing: Simulated;Minimal assistance (iv) Where Assessed - Upper Body Dressing: Supported sitting Lower Body Dressing: Simulated;Performed;Min guard (socks with set up) Where Assessed - Lower Body Dressing: Sopported sit to stand Toilet Transfer: Performed;Min guard Science writer: Raised toilet seat with arms (or 3-in-1 over toilet) Toileting - Clothing Manipulation and Hygiene: Performed;Min guard Where Assessed - Camera operator Manipulation and Hygiene: Sit to stand from 3-in-1 or toilet Equipment Used: Rolling walker Transfers/Ambulation Related to ADLs: min guard to walk into bathroom with RW ADL Comments: min guard for balance for adls.  Pt able to  cross legs to don socks:  feels she is getting her endurance back    OT Diagnosis: Generalized weakness  OT Problem List: Decreased strength;Decreased activity tolerance;Impaired balance (sitting and/or standing) OT Treatment Interventions: Self-care/ADL training;Patient/family education;Balance training;Therapeutic activities   OT Goals Acute Rehab OT Goals OT Goal Formulation: With patient Time For Goal Achievement: 02/16/12 Potential to Achieve Goals: Good ADL Goals Pt Will Perform Grooming: with supervision;Standing at sink (for 2 tasks) ADL Goal: Grooming - Progress: Goal set today Pt Will Transfer to Toilet: with supervision;Ambulation;3-in-1 ADL Goal: Toilet Transfer - Progress: Goal set today Miscellaneous OT Goals Miscellaneous OT Goal #1: Pt will complete lower body adls/toileting activities at supervision level for sit to stand with set up provided OT Goal: Miscellaneous Goal #1 - Progress: Goal set today  Visit Information  Last OT Received On: 02/02/12 Assistance Needed: +1    Subjective Data  Subjective: "I remember seeing you before" Patient Stated Goal: Home   Prior [place] Lives With: Significant other Available Help at Discharge: Personal care attendant Type of Home: Apartment Home Access: Ramped entrance Home Layout: One level Bathroom Shower/Tub: Chiropodist: Standard Home Adaptive Equipment: Bedside commode/3-in-1;Tub transfer bench;Walker - rolling;Straight cane Prior Function Level of Independence: Independent with assistive device(s) Driving: No Comments: has assist for IADLs Communication Communication: No difficulties    Cognition  Overall Cognitive Status: Appears within functional limits for tasks assessed/performed Behavior During Session: [facility] for tasks performed    Extremity/Trunk Assessment Right Upper Extremity Assessment RUE ROM/Strength/Tone: Within functional levels Left Upper Extremity  Assessment LUE ROM/Strength/Tone: Within functional levels   Mobility Transfers Sit to Stand: 4: Min guard;From chair/3-in-1   Exercise    Balance  End of Session OT - End of Session Activity Tolerance: Patient tolerated treatment well Patient left: in chair;with call bell/phone within reach   [facility] 02/02/2012, 4:58 PM Lesle Chris, OTR/L [phone] 02/02/2012

## 2012-02-03 DIAGNOSIS — G4710 Hypersomnia, unspecified: Secondary | ICD-10-CM

## 2012-02-03 DIAGNOSIS — IMO0001 Reserved for inherently not codable concepts without codable children: Secondary | ICD-10-CM

## 2012-02-03 DIAGNOSIS — K5732 Diverticulitis of large intestine without perforation or abscess without bleeding: Secondary | ICD-10-CM

## 2012-02-03 DIAGNOSIS — G4730 Sleep apnea, unspecified: Secondary | ICD-10-CM

## 2012-02-03 DIAGNOSIS — E1165 Type 2 diabetes mellitus with hyperglycemia: Secondary | ICD-10-CM

## 2012-02-03 DIAGNOSIS — I10 Essential (primary) hypertension: Secondary | ICD-10-CM

## 2012-02-03 LAB — HEMOGLOBIN AND HEMATOCRIT, BLOOD
HCT: 31.5 % — ABNORMAL LOW (ref 36.0–46.0)
Hemoglobin: 9.9 g/dL — ABNORMAL LOW (ref 12.0–15.0)

## 2012-02-03 LAB — GLUCOSE, CAPILLARY
Glucose-Capillary: 110 mg/dL — ABNORMAL HIGH (ref 70–99)
Glucose-Capillary: 134 mg/dL — ABNORMAL HIGH (ref 70–99)

## 2012-02-03 MED ORDER — METRONIDAZOLE 500 MG PO TABS: 500.0000 mg | ORAL_TABLET | Freq: Three times a day (TID) | ORAL | Status: AC

## 2012-02-03 MED ORDER — CIPROFLOXACIN HCL 500 MG PO TABS: 500.0000 mg | ORAL_TABLET | Freq: Two times a day (BID) | ORAL | Status: AC

## 2012-02-03 MED ORDER — ASPIRIN EC 81 MG PO TBEC: 81.0000 mg | DELAYED_RELEASE_TABLET | Freq: Every day | ORAL | Status: DC

## 2012-02-03 MED ORDER — HYDROCODONE-ACETAMINOPHEN 5-325 MG PO TABS: 1.0000 | ORAL_TABLET | Freq: Four times a day (QID) | ORAL | Status: AC | PRN

## 2012-02-03 MED ORDER — ASPIRIN EC 81 MG PO TBEC
81.0000 mg | DELAYED_RELEASE_TABLET | Freq: Every day | ORAL | Status: DC
  Filled 2012-02-03: qty 1

## 2012-02-03 NOTE — Discharge Instructions (Signed)
Return to ED or seek immediate medical attention if bleeding per rectum worsens

## 2012-02-03 NOTE — Discharge Summary (Signed)
PATIENT DETAILS Name: Maria [name] Age: 78 y.o. Sex: female Date of Birth: 12-09-1925 MRN: [id number]. Admit Date: 01/31/2012 Admitting Physician: Quintella Baton, MD [id number] N, MD, MD  PRIMARY DISCHARGE DIAGNOSIS:  Principal Problem:  *Diverticulitis Active Problems:  Diabetes mellitus  Hypertension  Obstructive sleep apnea on CPAP  History of TIAs  Obesity  H/O vertigo      PAST MEDICAL HISTORY: Past Medical History  Diagnosis Date  . Diabetes mellitus   . Hypertension   . Sleep apnea     cpap  . TIA (transient ischemic attack)   . Heart murmur   . Stroke     3 x tias  . Blood transfusion   . Arthritis   . Anxiety   . Depression     DISCHARGE MEDICATIONS: Medication List  As of 02/03/2012  3:31 PM   TAKE these medications         amLODipine 5 MG tablet   Commonly known as: NORVASC   Take 5 mg by mouth every morning.      aspirin 325 MG EC tablet   Take 325 mg by mouth at bedtime.      atorvastatin 40 MG tablet   Commonly known as: LIPITOR   Take 40 mg by mouth daily.      ciprofloxacin 500 MG tablet   Commonly known as: CIPRO   Take 1 tablet (500 mg total) by mouth 2 (two) times daily.      citalopram 20 MG tablet   Commonly known as: CELEXA   Take 20 mg by mouth daily.      donepezil 10 MG tablet   Commonly known as: ARICEPT   Take 10 mg by mouth daily.      ezetimibe 10 MG tablet   Commonly known as: ZETIA   Take 10 mg by mouth at bedtime.      furosemide 40 MG tablet   Commonly known as: LASIX   Take 40 mg by mouth 2 (two) times daily.      hydrALAZINE 25 MG tablet   Commonly known as: APRESOLINE   Take 25 mg by mouth 3 (three) times daily.      HYDROcodone-acetaminophen 5-325 MG per tablet   Commonly known as: NORCO   Take 1 tablet by mouth every 6 (six) hours as needed.      insulin glargine 100 UNIT/ML injection   Commonly known as: LANTUS   Inject 18 Units into the skin at bedtime.      isosorbide mononitrate 30 MG  24 hr tablet   Commonly known as: IMDUR   Take 30 mg by mouth daily.      loratadine 10 MG tablet   Commonly known as: CLARITIN   Take 10 mg by mouth every morning.      METANX PO   Take 1 capsule by mouth daily.      metroNIDAZOLE 500 MG tablet   Commonly known as: FLAGYL   Take 1 tablet (500 mg total) by mouth 3 (three) times daily.      potassium chloride 10 MEQ tablet   Commonly known as: K-DUR   Take 10 mEq by mouth 2 (two) times daily.      telmisartan 80 MG tablet   Commonly known as: MICARDIS   Take 80 mg by mouth daily.      VITAMIN D (CHOLECALCIFEROL) PO   Take 200 Units by mouth daily.             BRIEF  HPI:  See H&P, Labs, Consult and Test reports for all details in brief, patient was admitted for LLQ pain, she was found to have diverticulitis. She was then admitted to the hospitalist service for further evaluation and treatment.  CONSULTATIONS:   None  PERTINENT RADIOLOGIC STUDIES: Ct Abdomen Pelvis W Contrast  01/31/2012  *RADIOLOGY REPORT*  Clinical Data: Left lower quadrant abdominal pain for the past 2 days.  CT ABDOMEN AND PELVIS WITH CONTRAST  Technique:  Multidetector CT imaging of the abdomen and pelvis was performed following the standard protocol during bolus administration of intravenous contrast.  Contrast: 155mL OMNIPAQUE IOHEXOL 300 MG/ML  SOLN  Comparison: No priors.  Findings:  Lung Bases: Subsegmental atelectasis versus scarring in the lower lobes of the lungs bilaterally.  Mild cardiomegaly. There is a small hiatal hernia. Atherosclerotic calcifications in the distal descending thoracic aorta.  Abdomen/Pelvis:  In segment 6 of the liver there is a 6 mm low attenuation lesion which is too small to definitively characterize. The liver is otherwise unremarkable in appearance.  The enhanced appearance of the gallbladder, pancreas, spleen and bilateral adrenal glands is unremarkable.  There is mild bilateral perinephric stranding which is a  nonspecific finding can be seen in the setting of renal insufficiency.  4 mm nonobstructive calculus in the lower pole collecting system of the right kidney.  There are some areas of parenchymal thinning in the kidneys bilaterally that could be indicative of scarring from remote infection.  There is extensive atherosclerosis throughout the abdominal and pelvic vasculature, without evidence of aneurysm or dissection at this time.  A tiny umbilical hernia is present containing only a small amount of omental fat.  There are numerous colonic diverticulae.  Importantly, in the proximal sigmoid colon where there are several diverticulae there is profound colonic wall thickening and some surrounding inflammatory changes in the associated mesocolon, concerning for acute diverticulitis.  No well-defined fluid collection is noted at this time to suggest the presence of a diverticular abscess.  No ascites or frank pneumoperitoneum is noted to suggest the presence of a perforation.  Status post total abdominal hysterectomy and bilateral salpingo-oophorectomy.  Urinary bladder is unremarkable in appearance.  Musculoskeletal: There are no aggressive appearing lytic or blastic lesions noted in the visualized portions of the skeleton.  The interbody cages are present at the L4-L5 interspace.  Grade 1 anterolisthesis of L4 upon L5 is noted.  IMPRESSION: 1.  Findings, as above, compatible with acute diverticulitis in the proximal sigmoid colon.  No definite evidence of diverticular abscess or frank perforation noted at this time. 2.  Severe atherosclerosis. 3.  Small hiatal hernia. 4.  Mild cardiomegaly. 5.  Additional incidental findings, as above.  These results were called by telephone on 01/31/2012  at  07:50 p.m. to  Dr. Dorna Mai, who verbally acknowledged these results.  Original Report Authenticated By: Etheleen Mayhew, M.D.   Dg Abd Acute W/chest  01/31/2012  *RADIOLOGY REPORT*  Clinical Data: 1-day history of abdominal  pain.  History of diabetes hypertension.  ACUTE ABDOMEN SERIES (ABDOMEN 2 VIEW & CHEST 1 VIEW)  Comparison: No prior abdominal imaging.  Two-view chest x-ray 11/07/2010.  Findings: Nonobstructive bowel gas pattern.  Small calcification projected over the lower pole of the right kidney, likely a small calculus.  Osteopenia, degenerative changes throughout the lumbar spine, and prior L4-5 fusion with ray cages.  Cardiac silhouette normal in size, unchanged.  Thoracic aorta mildly tortuous and atherosclerotic, unchanged.  Hilar and mediastinal contours otherwise unremarkable.  Mild biapical pleuroparenchymal scarring, unchanged.  Lungs otherwise clear.  IMPRESSION:  1.  No acute abdominal abnormality.  Large stool burden. 2.  Probable right lower pole renal calculus. 3.  No acute cardiopulmonary disease.  Original Report Authenticated By: Deniece Portela, M.D.     PERTINENT LAB RESULTS: CBC:  Basename 02/03/12 0941 02/02/12 0924 02/01/12 0524  WBC -- 7.2 7.3  HGB 9.9* 10.0* --  HCT 31.5* 30.5* --  PLT -- 130* 139*   CMET CMP     Component Value Date/Time   NA 137 02/02/2012 0924   K 4.4 02/02/2012 0924   CL 101 02/02/2012 0924   CO2 27 02/02/2012 0924   GLUCOSE 180* 02/02/2012 0924   BUN 14 02/02/2012 0924   CREATININE 1.00 02/02/2012 0924   CALCIUM 8.9 02/02/2012 0924   PROT 6.0 02/02/2012 0924   ALBUMIN 3.0* 02/02/2012 0924   AST 19 02/02/2012 0924   ALT 15 02/02/2012 0924   ALKPHOS 59 02/02/2012 0924   BILITOT 0.5 02/02/2012 0924   GFRNONAA 51* 02/02/2012 0924   GFRAA 59* 02/02/2012 0924    GFR Estimated Creatinine Clearance: 45.1 ml/min (by C-G formula based on Cr of 1).  Basename 01/31/12 1645  LIPASE 49  AMYLASE --   No results found for this basename: CKTOTAL:3,CKMB:3,CKMBINDEX:3,TROPONINI:3 in the last 72 hours No components found with this basename: POCBNP:3 No results found for this basename: DDIMER:2 in the last 72 hours No results found for this basename: HGBA1C:2 in the last  72 hours No results found for this basename: CHOL:2,HDL:2,LDLCALC:2,TRIG:2,CHOLHDL:2,LDLDIRECT:2 in the last 72 hours No results found for this basename: TSH,T4TOTAL,FREET3,T3FREE,THYROIDAB in the last 72 hours No results found for this basename: VITAMINB12:2,FOLATE:2,FERRITIN:2,TIBC:2,IRON:2,RETICCTPCT:2 in the last 72 hours Coags:  Basename 01/31/12 1645  INR 1.01   Microbiology: No results found for this or any previous visit (from the past 240 hour(s)).   BRIEF HOSPITAL COURSE:   Principal Problem: Diverticulitis  Patient started on Cipro and Flagyl IV at the time of admission.  Tolerating full liquids-advanced  to regular diet today-tolerated-ok for d/c today. Pain significantly better. She will need to see Dr. Collene Mares in outpatient follow up.  Large Stool Burden on Xray-multiple BM's with use of Miralax   Syncope on 5/24  -likely vaso-vagal-occurred while having a BM  -Tele-unremarkable  -2 D Echo-The estimated ejection fraction was in the range of 55% to 65%. Wall motion was normal; there were no regional wall motion abnormalities. Features are consistent with a pseudonormal left ventricular filling pattern, with concomitant abnormal relaxation and increased filling pressure (grade 2 diastolicdysfunction).   Hematochezia  2-3 episodes of mild hematochezia this admission -likely hemorrhoids  -Colonoscopy-done last year-no major abnormalities per Dr Collene Mares, discussed with Dr Mayo Ao further w/u needed at this point-just observation -H/H stable without any significant drop. -patient was asked to seek immediate medical attention if hematochezia gets worse  Obstructive sleep apnea  Stable. Patient refused CPAP in the hospital.   Morbid obesity  -counseled extensively regarding importance of weight loss   Diabetes mellitus  CBGs well controlled on reduced dose of Lantus and SSI-Sensitive. Resume home dose of Lantus at discharge  Hypertension  Controlled on home medications.    -continue with Amlodipine, Hydralazine,Avapro and Imdur on discharge  Dyslipidemia  -continue with Zetia/Statin   Constipation  -numerous BM's with Miralax-subsequently resolved  TODAY-DAY OF DISCHARGE:  Subjective:   Miquel Weathersby today has no headache,no chest abdominal pain,no new weakness tingling or numbness, feels much better wants to  go home today. She has tolerated a regular diet.  Objective:   Blood pressure 121/68, pulse 59, temperature 98.7 F (37.1 C), temperature source Oral, resp. rate 17, height 5\' 2"  (1.575 m), weight 92.4 kg (203 lb 11.3 oz), SpO2 99.00%.  Intake/Output Summary (Last 24 hours) at 02/03/12 1531 Last data filed at 02/03/12 1433  Gross per 24 hour  Intake 877.83 ml  Output    300 ml  Net 577.83 ml    Exam Awake Alert, Oriented *3, No new F.N deficits, Normal affect .AT,PERRAL Supple Neck,No JVD, No cervical lymphadenopathy appriciated.  Symmetrical Chest wall movement, Good air movement bilaterally, CTAB RRR,No Gallops,Rubs or new Murmurs, No Parasternal Heave +ve B.Sounds, Abd Soft, Non tender  LLQ today, No organomegaly appriciated, No rebound -guarding or rigidity. No Cyanosis, Clubbing or edema, No new Rash or bruise  DISPOSITION: Home  DISCHARGE INSTRUCTIONS:    Follow-up Information    Follow up with Maximino Greenland, MD. Schedule an appointment as soon as possible for a visit in 1 week.   Contact information:   [address] [place] [phone]       Follow up with Juanita Craver, MD. Schedule an appointment as soon as possible for a visit on 02/05/2012.   Contact information:   [address] [place] [id number]         Total Time spent on discharge equals 45 minutes.  SignedOren Binet 02/03/2012 3:31 PM

## 2012-02-03 NOTE — Progress Notes (Signed)
ANTIBIOTIC CONSULT NOTE - FOLLOW UP  Pharmacy Consult for Cipro Indication: Empiric diverticulitis/abdominal coverage  Allergies  Allergen Reactions  . Ace Inhibitors Other (See Comments)    Angioedema     Patient Measurements: Height: 5\' 2"  (157.5 cm) Weight: 203 lb 11.3 oz (92.4 kg) IBW/kg (Calculated) : 50.1   Vital Signs: Temp: 98.7 F (37.1 C) (05/26 0510) Temp src: Oral (05/26 0510) BP: 121/68 mmHg (05/26 0510) Pulse Rate: 59  (05/26 0510) Intake/Output from previous day: 05/25 0701 - 05/26 0700 In: 1282.5 [P.O.:480; I.V.:502.5; IV Piggyback:300] Out: 300 [Urine:300] Intake/Output from this shift: Total I/O In: 375.3 [I.V.:75.3; IV Piggyback:300] Out: -   Labs:  Basename 02/03/12 0941 02/02/12 0924 02/01/12 0524 01/31/12 1645  WBC -- 7.2 7.3 8.9  HGB 9.9* 10.0* 9.4* --  PLT -- 130* 139* 154  LABCREA -- -- -- --  CREATININE -- 1.00 0.99 1.00   Estimated Creatinine Clearance: 45.1 ml/min (by C-G formula based on Cr of 1). No results found for this basename: VANCOTROUGH:2,VANCOPEAK:2,VANCORANDOM:2,GENTTROUGH:2,GENTPEAK:2,GENTRANDOM:2,TOBRATROUGH:2,TOBRAPEAK:2,TOBRARND:2,AMIKACINPEAK:2,AMIKACINTROU:2,AMIKACIN:2, in the last 72 hours   Microbiology: No results found for this or any previous visit (from the past 720 hour(s)).  Anti-infectives     Start     Dose/Rate Route Frequency Ordered Stop   02/01/12 0800   ciprofloxacin (CIPRO) IVPB 400 mg        400 mg 200 mL/hr over 60 Minutes Intravenous Every 12 hours 01/31/12 2357     02/01/12 0600   metroNIDAZOLE (FLAGYL) IVPB 500 mg        500 mg 100 mL/hr over 60 Minutes Intravenous Every 8 hours 01/31/12 2325     01/31/12 2000   ciprofloxacin (CIPRO) IVPB 400 mg        400 mg 200 mL/hr over 60 Minutes Intravenous  Once 01/31/12 1951 01/31/12 2147   01/31/12 2000   metroNIDAZOLE (FLAGYL) IVPB 500 mg        500 mg 100 mL/hr over 60 Minutes Intravenous  Once 01/31/12 1951 01/31/12 2250           Assessment: 78 y.o. F on Ciprofloxacin + Flagyl D#4 for empiric diverticulitis/abdominal coverage. Patient clinically improving and renal function stable. Doses remain appropriate. Consider de-escalating or discontinuing antibiotics due to clinical improvement. Prolonged use of flouroquinolones in elderly patients is also associated with an increased risk of CDiff. At a minimum consider transition to po and addressing LOT.   Goal of Therapy:  Eradication of infection  Plan:  1. Continue Cipro 400 mg IV every 12 hours 2. Please address de-escalation, discontinuation, or LOT as stated above. 3. Will continue to follow renal function, culture results, LOT, and antibiotic de-escalation plans  Alycia Rossetti, PharmD, BCPS Clinical Pharmacist Pager: [phone] 02/03/2012 2:52 PM

## 2012-02-03 NOTE — Progress Notes (Signed)
PATIENT DETAILS Name: Maria [name] Age: 78 y.o. Sex: female Date of Birth: [birth year] Admit Date: 01/31/2012 [id number] N, MD, MD  Subjective: No further abdominal pain  Objective: Vital signs in last 24 hours: Filed Vitals:   02/02/12 0903 02/02/12 1335 02/02/12 2052 02/03/12 0510  BP: 115/67 143/67 145/68 121/68  Pulse:  60 62 59  Temp:  98.2 F (36.8 C) 98.5 F (36.9 C) 98.7 F (37.1 C)  TempSrc:  Oral Oral Oral  Resp:  20 18 17   Height:      Weight:      SpO2:  95% 98% 99%    Weight change:   Body mass index is 37.26 kg/(m^2).  Intake/Output from previous day:  Intake/Output Summary (Last 24 hours) at 02/03/12 1223 Last data filed at 02/02/12 2053  Gross per 24 hour  Intake  842.5 ml  Output    300 ml  Net  542.5 ml    PHYSICAL EXAM: Gen Exam: Awake and alert with clear speech.  Neck: Supple, No JVD.   Chest: B/L Clear.   CVS: S1 S2 Regular, no murmurs.  Abdomen: soft, BS +, non tender in LLQ, non distended.  Extremities: no edema, lower extremities warm to touch. Neurologic: Non Focal.   Skin: No Rash.   Wounds: N/A.    CONSULTS:  None  LAB RESULTS: CBC  Lab 02/03/12 0941 02/02/12 0924 02/01/12 0524 01/31/12 1645  WBC -- 7.2 7.3 8.9  HGB 9.9* 10.0* 9.4* 10.5*  HCT 31.5* 30.5* 29.5* 32.4*  PLT -- 130* 139* 154  MCV -- 90.5 90.2 87.8  MCH -- 29.7 28.7 28.5  MCHC -- 32.8 31.9 32.4  RDW -- 15.1 15.4 15.2  LYMPHSABS -- -- -- 1.6  MONOABS -- -- -- 0.9  EOSABS -- -- -- 0.0  BASOSABS -- -- -- 0.0  BANDABS -- -- -- --    Chemistries   Lab 02/02/12 0924 02/01/12 0524 01/31/12 1645  NA 137 139 137  K 4.4 3.9 3.7  CL 101 102 98  CO2 27 31 31   GLUCOSE 180* 117* 135*  BUN 14 19 23   CREATININE 1.00 0.99 1.00  CALCIUM 8.9 8.5 9.3  MG -- -- --    GFR Estimated Creatinine Clearance: 45.1 ml/min (by C-G formula based on Cr of 1).  Coagulation profile  Lab 01/31/12 1645  INR 1.01  PROTIME --    Cardiac Enzymes No results  found for this basename: CK:3,CKMB:3,TROPONINI:3,MYOGLOBIN:3 in the last 168 hours  No components found with this basename: POCBNP:3 No results found for this basename: DDIMER:2 in the last 72 hours No results found for this basename: HGBA1C:2 in the last 72 hours No results found for this basename: CHOL:2,HDL:2,LDLCALC:2,TRIG:2,CHOLHDL:2,LDLDIRECT:2 in the last 72 hours No results found for this basename: TSH,T4TOTAL,FREET3,T3FREE,THYROIDAB in the last 72 hours No results found for this basename: VITAMINB12:2,FOLATE:2,FERRITIN:2,TIBC:2,IRON:2,RETICCTPCT:2 in the last 72 hours  Basename 01/31/12 1645  LIPASE 49  AMYLASE --    Urine Studies No results found for this basename: UACOL:2,UAPR:2,USPG:2,UPH:2,UTP:2,UGL:2,UKET:2,UBIL:2,UHGB:2,UNIT:2,UROB:2,ULEU:2,UEPI:2,UWBC:2,URBC:2,UBAC:2,CAST:2,CRYS:2,UCOM:2,BILUA:2 in the last 72 hours  MICROBIOLOGY: No results found for this or any previous visit (from the past 240 hour(s)).  RADIOLOGY STUDIES/RESULTS: Ct Abdomen Pelvis W Contrast  01/31/2012  *RADIOLOGY REPORT*  Clinical Data: Left lower quadrant abdominal pain for the past 2 days.  CT ABDOMEN AND PELVIS WITH CONTRAST  Technique:  Multidetector CT imaging of the abdomen and pelvis was performed following the standard protocol during bolus administration of intravenous contrast.  Contrast: 165mL OMNIPAQUE IOHEXOL 300  MG/ML  SOLN  Comparison: No priors.  Findings:  Lung Bases: Subsegmental atelectasis versus scarring in the lower lobes of the lungs bilaterally.  Mild cardiomegaly. There is a small hiatal hernia. Atherosclerotic calcifications in the distal descending thoracic aorta.  Abdomen/Pelvis:  In segment 6 of the liver there is a 6 mm low attenuation lesion which is too small to definitively characterize. The liver is otherwise unremarkable in appearance.  The enhanced appearance of the gallbladder, pancreas, spleen and bilateral adrenal glands is unremarkable.  There is mild bilateral  perinephric stranding which is a nonspecific finding can be seen in the setting of renal insufficiency.  4 mm nonobstructive calculus in the lower pole collecting system of the right kidney.  There are some areas of parenchymal thinning in the kidneys bilaterally that could be indicative of scarring from remote infection.  There is extensive atherosclerosis throughout the abdominal and pelvic vasculature, without evidence of aneurysm or dissection at this time.  A tiny umbilical hernia is present containing only a small amount of omental fat.  There are numerous colonic diverticulae.  Importantly, in the proximal sigmoid colon where there are several diverticulae there is profound colonic wall thickening and some surrounding inflammatory changes in the associated mesocolon, concerning for acute diverticulitis.  No well-defined fluid collection is noted at this time to suggest the presence of a diverticular abscess.  No ascites or frank pneumoperitoneum is noted to suggest the presence of a perforation.  Status post total abdominal hysterectomy and bilateral salpingo-oophorectomy.  Urinary bladder is unremarkable in appearance.  Musculoskeletal: There are no aggressive appearing lytic or blastic lesions noted in the visualized portions of the skeleton.  The interbody cages are present at the L4-L5 interspace.  Grade 1 anterolisthesis of L4 upon L5 is noted.  IMPRESSION: 1.  Findings, as above, compatible with acute diverticulitis in the proximal sigmoid colon.  No definite evidence of diverticular abscess or frank perforation noted at this time. 2.  Severe atherosclerosis. 3.  Small hiatal hernia. 4.  Mild cardiomegaly. 5.  Additional incidental findings, as above.  These results were called by telephone on 01/31/2012  at  07:50 p.m. to  Dr. Dorna Mai, who verbally acknowledged these results.  Original Report Authenticated By: Etheleen Mayhew, M.D.   Dg Abd Acute W/chest  01/31/2012  *RADIOLOGY REPORT*  Clinical  Data: 1-day history of abdominal pain.  History of diabetes hypertension.  ACUTE ABDOMEN SERIES (ABDOMEN 2 VIEW & CHEST 1 VIEW)  Comparison: No prior abdominal imaging.  Two-view chest x-ray 11/07/2010.  Findings: Nonobstructive bowel gas pattern.  Small calcification projected over the lower pole of the right kidney, likely a small calculus.  Osteopenia, degenerative changes throughout the lumbar spine, and prior L4-5 fusion with ray cages.  Cardiac silhouette normal in size, unchanged.  Thoracic aorta mildly tortuous and atherosclerotic, unchanged.  Hilar and mediastinal contours otherwise unremarkable.  Mild biapical pleuroparenchymal scarring, unchanged.  Lungs otherwise clear.  IMPRESSION:  1.  No acute abdominal abnormality.  Large stool burden. 2.  Probable right lower pole renal calculus. 3.  No acute cardiopulmonary disease.  Original Report Authenticated By: Deniece Portela, M.D.    MEDICATIONS: Scheduled Meds:    . amLODipine  5 mg Oral q morning - 10a  . antiseptic oral rinse  15 mL Mouth Rinse q12n4p  . aspirin  81 mg Oral QHS  . atorvastatin  40 mg Oral Daily  . chlorhexidine  15 mL Mouth Rinse BID  . ciprofloxacin  400 mg  Intravenous Q12H  . citalopram  20 mg Oral Daily  . donepezil  10 mg Oral Daily  . enoxaparin  40 mg Subcutaneous Q24H  . ezetimibe  10 mg Oral QHS  . hydrALAZINE  25 mg Oral TID  . insulin aspart  0-15 Units Subcutaneous Q6H  . insulin aspart  0-5 Units Subcutaneous QHS  . insulin glargine  10 Units Subcutaneous q morning - 10a  . irbesartan  150 mg Oral Daily  . isosorbide mononitrate  30 mg Oral Daily  . metronidazole  500 mg Intravenous Q8H  . DISCONTD: aspirin  325 mg Oral QHS  . DISCONTD: polyethylene glycol  17 g Oral TID  . DISCONTD: polyethylene glycol  17 g Oral Daily   Continuous Infusions:    . sodium chloride 10 mL/hr at 02/02/12 1641   PRN Meds:.acetaminophen, acetaminophen, albuterol, alum & mag hydroxide-simeth,  HYDROcodone-acetaminophen, ipratropium, ondansetron (ZOFRAN) IV, ondansetron  Antibiotics: Anti-infectives     Start     Dose/Rate Route Frequency Ordered Stop   02/01/12 0800   ciprofloxacin (CIPRO) IVPB 400 mg        400 mg 200 mL/hr over 60 Minutes Intravenous Every 12 hours 01/31/12 2357     02/01/12 0600   metroNIDAZOLE (FLAGYL) IVPB 500 mg        500 mg 100 mL/hr over 60 Minutes Intravenous Every 8 hours 01/31/12 2325     01/31/12 2000   ciprofloxacin (CIPRO) IVPB 400 mg        400 mg 200 mL/hr over 60 Minutes Intravenous  Once 01/31/12 1951 01/31/12 2147   01/31/12 2000   metroNIDAZOLE (FLAGYL) IVPB 500 mg        500 mg 100 mL/hr over 60 Minutes Intravenous  Once 01/31/12 1951 01/31/12 2250          Assessment/Plan: Patient Active Hospital Problem List: Diverticulitis  Patient started on Cipro and Flagyl IV at the time of admission.  Tolerating full liquids-advance to regular diet She will need to see Dr. Collene Mares in outpatient follow up.  Large Stool Burden on Xray-multiple BM's with use of Miralax Pain medications as needed. The patient has desaturated in the ER with the fentanyl and Dilaudid. She quickly rebounded on 2 L nasal cannula.   Syncope on 5/24 -likely vaso-vagal-occurred while having a BM -Tele-unremarkable -2 D Echo-reveiwed  Hematochezia -likely hemorrhoids -Colonoscopy-done last year-no major abnormalities per Dr Collene Mares -H/H stable  Obstructive sleep apnea  Stable. Patient refuses CPAP in the hospital.   Morbid obesity  -counseled extensively regarding importance of weight loss  Diabetes mellitus  CBGs well controlled on reduced dose of Lantus and SSI-Sensitive. May need to increase lantus as she starts eating more.   Hypertension  Controlled on home medications.  -continue with Amlodipine, Hydralazine,Avapro and Imdur  Dyslipidemia -continue with Zetia/Statin  Constipation -numerous BM's  DVT Prophylaxis Prophylactic Lovenox.    Disposition:  home with home health-when ready  Code Status: Full Code  Jonetta Osgood,  MD. 02/03/2012, 12:23 PM

## 2012-02-03 NOTE — Progress Notes (Signed)
Maria [name] discharged Home per MD order.  Discharge instructions reviewed and discussed with the patient, all questions and concerns answered. Copy of instructions and scripts given to patient.   Maria [name], Maria [name]  Home Medication Instructions [id number]   Printed on:02/03/12 1537  Medication Information                    atorvastatin (LIPITOR) 40 MG tablet Take 40 mg by mouth daily.           aspirin 325 MG EC tablet Take 325 mg by mouth at bedtime.            furosemide (LASIX) 40 MG tablet Take 40 mg by mouth 2 (two) times daily.           potassium chloride (K-DUR) 10 MEQ tablet Take 10 mEq by mouth 2 (two) times daily.           ezetimibe (ZETIA) 10 MG tablet Take 10 mg by mouth at bedtime.           insulin glargine (LANTUS) 100 UNIT/ML injection Inject 18 Units into the skin at bedtime.           loratadine (CLARITIN) 10 MG tablet Take 10 mg by mouth every morning.           amLODipine (NORVASC) 5 MG tablet Take 5 mg by mouth every morning.           VITAMIN D, CHOLECALCIFEROL, PO Take 200 Units by mouth daily.           telmisartan (MICARDIS) 80 MG tablet Take 80 mg by mouth daily.           citalopram (CELEXA) 20 MG tablet Take 20 mg by mouth daily.           donepezil (ARICEPT) 10 MG tablet Take 10 mg by mouth daily.           isosorbide mononitrate (IMDUR) 30 MG 24 hr tablet Take 30 mg by mouth daily.           hydrALAZINE (APRESOLINE) 25 MG tablet Take 25 mg by mouth 3 (three) times daily.           L-Methylfolate-B6-B12 (METANX PO) Take 1 capsule by mouth daily.           HYDROcodone-acetaminophen (NORCO) 5-325 MG per tablet Take 1 tablet by mouth every 6 (six) hours as needed.           ciprofloxacin (CIPRO) 500 MG tablet Take 1 tablet (500 mg total) by mouth 2 (two) times daily.           metroNIDAZOLE (FLAGYL) 500 MG tablet Take 1 tablet (500 mg total) by mouth 3 (three) times daily.             Patients skin is clean, dry and  intact, no evidence of skin break down. IV site discontinued and catheter remains intact. Site without signs and symptoms of complications. Dressing and pressure applied.  Patient escorted to car by NT in a wheelchair,  no distress noted upon discharge.  Maria [name] [facility] 02/03/2012 04:41

## 2012-04-06 ENCOUNTER — Emergency Department (HOSPITAL_COMMUNITY)
Admission: EM | Admit: 2012-04-06 | Discharge: 2012-04-06 | Disposition: A | Discharge status: Home / Self Care | Payer: Medicare Other | Attending: Emergency Medicine | Admitting: Emergency Medicine

## 2012-04-06 ENCOUNTER — Encounter (HOSPITAL_COMMUNITY): Payer: Self-pay | Admitting: Physical Medicine and Rehabilitation

## 2012-04-06 DIAGNOSIS — F3289 Other specified depressive episodes: Secondary | ICD-10-CM | POA: Insufficient documentation

## 2012-04-06 DIAGNOSIS — R5381 Other malaise: Secondary | ICD-10-CM | POA: Insufficient documentation

## 2012-04-06 DIAGNOSIS — F411 Generalized anxiety disorder: Secondary | ICD-10-CM | POA: Insufficient documentation

## 2012-04-06 DIAGNOSIS — Z794 Long term (current) use of insulin: Secondary | ICD-10-CM | POA: Insufficient documentation

## 2012-04-06 DIAGNOSIS — R5383 Other fatigue: Secondary | ICD-10-CM | POA: Insufficient documentation

## 2012-04-06 DIAGNOSIS — F329 Major depressive disorder, single episode, unspecified: Secondary | ICD-10-CM | POA: Insufficient documentation

## 2012-04-06 DIAGNOSIS — R55 Syncope and collapse: Secondary | ICD-10-CM | POA: Insufficient documentation

## 2012-04-06 DIAGNOSIS — G4730 Sleep apnea, unspecified: Secondary | ICD-10-CM | POA: Insufficient documentation

## 2012-04-06 DIAGNOSIS — Z8673 Personal history of transient ischemic attack (TIA), and cerebral infarction without residual deficits: Secondary | ICD-10-CM | POA: Insufficient documentation

## 2012-04-06 DIAGNOSIS — E119 Type 2 diabetes mellitus without complications: Secondary | ICD-10-CM | POA: Insufficient documentation

## 2012-04-06 LAB — CBC
HCT: 34.4 % — ABNORMAL LOW (ref 36.0–46.0)
Hemoglobin: 11.2 g/dL — ABNORMAL LOW (ref 12.0–15.0)
MCH: 28.9 pg (ref 26.0–34.0)
MCHC: 32.6 g/dL (ref 30.0–36.0)
MCV: 88.7 fL (ref 78.0–100.0)
Platelets: 156 10*3/uL (ref 150–400)
RBC: 3.88 MIL/uL (ref 3.87–5.11)
RDW: 14.7 % (ref 11.5–15.5)
WBC: 6.4 10*3/uL (ref 4.0–10.5)

## 2012-04-06 LAB — URINALYSIS, ROUTINE W REFLEX MICROSCOPIC
Bilirubin Urine: NEGATIVE
Glucose, UA: NEGATIVE mg/dL
Hgb urine dipstick: NEGATIVE
Ketones, ur: NEGATIVE mg/dL
Leukocytes, UA: NEGATIVE
Nitrite: NEGATIVE
Protein, ur: NEGATIVE mg/dL
Specific Gravity, Urine: 1.01 (ref 1.005–1.030)
Urobilinogen, UA: 0.2 mg/dL (ref 0.0–1.0)
pH: 6 (ref 5.0–8.0)

## 2012-04-06 LAB — BASIC METABOLIC PANEL
BUN: 29 mg/dL — ABNORMAL HIGH (ref 6–23)
CO2: 28 mEq/L (ref 19–32)
Calcium: 9.7 mg/dL (ref 8.4–10.5)
Chloride: 98 mEq/L (ref 96–112)
Creatinine, Ser: 1.12 mg/dL — ABNORMAL HIGH (ref 0.50–1.10)
GFR calc Af Amer: 51 mL/min — ABNORMAL LOW (ref >90)
GFR calc non Af Amer: 44 mL/min — ABNORMAL LOW (ref >90)
Glucose, Bld: 158 mg/dL — ABNORMAL HIGH (ref 70–99)
Potassium: 3.6 mEq/L (ref 3.5–5.1)
Sodium: 139 mEq/L (ref 135–145)

## 2012-04-06 LAB — GLUCOSE, CAPILLARY: Glucose-Capillary: 158 mg/dL — ABNORMAL HIGH (ref 70–99)

## 2012-04-06 MED ORDER — INSULIN ASPART 100 UNIT/ML ~~LOC~~ SOLN
10.0000 [IU] | Freq: Once | SUBCUTANEOUS | Status: AC
  Administered 2012-04-06: 10 [IU] via SUBCUTANEOUS
  Filled 2012-04-06: qty 1

## 2012-04-06 MED ORDER — INSULIN GLARGINE 100 UNIT/ML ~~LOC~~ SOLN: 18.0000 [IU] | Freq: Every day | SUBCUTANEOUS | Status: DC

## 2012-04-06 NOTE — ED Notes (Signed)
I gave the patient a warm blanket. 

## 2012-04-06 NOTE — ED Provider Notes (Signed)
History     CSN: [id number]  Arrival date & time 04/06/12  1704   First MD Initiated Contact with Patient 04/06/12 1706      Chief Complaint  Patient presents with  . Near Syncope    (Consider location/radiation/quality/duration/timing/severity/associated sxs/prior treatment) HPI  Pt came to the ER with her husband. Her husband was being brought in by EMS and I witnessed while they were walking in her get emotionally overwhelmed, weak, and almost fell over. The EMS caught her. She admits to me that she has been taking care of her husband, providing him a lot of care and over exerted herself. She also admits to missing her meals today because she was busy. The patient is a diabetic and admits that she can not miss meals. She denies N/V, SOB, CP, dysuria, wheezing, or any other associated symptoms besides feeling weak and overwhelmed. VSS/NAD  Past Medical History  Diagnosis Date  . Diabetes mellitus   . Hypertension   . Sleep apnea     cpap  . TIA (transient ischemic attack)   . Heart murmur   . Stroke     3 x tias  . Blood transfusion   . Arthritis   . Anxiety   . Depression     Past Surgical History  Procedure Date  . Partial hysterectomy   . Abdominal hysterectomy   . Appendectomy     Family History  Problem Relation Age of Onset  . Prostate cancer      History  Substance Use Topics  . Smoking status: Never Smoker   . Smokeless tobacco: Not on file  . Alcohol Use: No    OB History    Grav Para Term Preterm Abortions TAB SAB Ect Mult Living                  Review of Systems   HEENT: denies blurry vision or change in hearing PULMONARY: Denies difficulty breathing and SOB CARDIAC: denies chest pain or heart palpitations MUSCULOSKELETAL:  denies being unable to ambulate ABDOMEN AL: denies abdominal pain GU: denies loss of bowel or urinary control NEURO: denies numbness and tingling in extremities SKIN: no new rashes PSYCH: patient denies anxiety  or depression. NECK: Pt denies having neck pain     Allergies  Ace inhibitors  Home Medications   Current Outpatient Rx  Name Route Sig Dispense Refill  . AMLODIPINE BESYLATE 5 MG PO TABS Oral Take 5 mg by mouth every morning.    . ASPIRIN 325 MG PO TBEC Oral Take 325 mg by mouth at bedtime.     . ATORVASTATIN CALCIUM 40 MG PO TABS Oral Take 40 mg by mouth daily.    Marland Kitchen CITALOPRAM HYDROBROMIDE 20 MG PO TABS Oral Take 20 mg by mouth daily.    . DONEPEZIL HCL 10 MG PO TABS Oral Take 10 mg by mouth daily.    Marland Kitchen EZETIMIBE 10 MG PO TABS Oral Take 10 mg by mouth at bedtime.    . FUROSEMIDE 40 MG PO TABS Oral Take 40 mg by mouth 2 (two) times daily.    Marland Kitchen HYDRALAZINE HCL 25 MG PO TABS Oral Take 25 mg by mouth 3 (three) times daily.    . INSULIN GLARGINE 100 UNIT/ML [place] SOLN Subcutaneous Inject 18 Units into the skin at bedtime.    . ISOSORBIDE MONONITRATE ER 30 MG PO TB24 Oral Take 30 mg by mouth daily.    Marland Kitchen METANX PO Oral Take 1 capsule by mouth  daily.    Marland Kitchen LORATADINE 10 MG PO TABS Oral Take 10 mg by mouth every morning.    Marland Kitchen POTASSIUM CHLORIDE ER 10 MEQ PO TBCR Oral Take 10 mEq by mouth 2 (two) times daily.    . TELMISARTAN 80 MG PO TABS Oral Take 80 mg by mouth daily.    Marland Kitchen VITAMIN D (CHOLECALCIFEROL) PO Oral Take 200 Units by mouth daily.      BP 152/61  Pulse 65  Temp 98 F (36.7 C) (Oral)  Resp 14  SpO2 98%  Physical Exam  Nursing note and vitals reviewed. Constitutional: She appears well-developed and well-nourished. No distress.  HENT:  Head: Normocephalic and atraumatic.  Eyes: Pupils are equal, round, and reactive to light.  Neck: Normal range of motion. Neck supple.  Cardiovascular: Normal rate and regular rhythm.   Pulmonary/Chest: Effort normal.  Abdominal: Soft.  Neurological: She is alert.  Skin: Skin is warm and dry.    ED Course  Procedures (including critical care time)  Labs Reviewed  CBC - Abnormal; Notable for the following:    Hemoglobin 11.2 (*)      HCT 34.4 (*)     All other components within normal limits  BASIC METABOLIC PANEL - Abnormal; Notable for the following:    Glucose, Bld 158 (*)     BUN 29 (*)     Creatinine, Ser 1.12 (*)     GFR calc non Af Amer 44 (*)     GFR calc Af Amer 51 (*)     All other components within normal limits  GLUCOSE, CAPILLARY - Abnormal; Notable for the following:    Glucose-Capillary 158 (*)     All other components within normal limits  URINALYSIS, ROUTINE W REFLEX MICROSCOPIC   No results found.   1. Vasovagal near syncope       MDM  PT CBG is stable. She admits after resting, eating and receiving her insulin that she feels 100% better. She says she just got overly tired. Her EKG is normal, she has no symptoms. She requests to be dscharged so she can go sit with her husband. When i come into room she is laughing and dancing in her bed while talking on the phone.  Dr. Monia Pouch has seen patient as well and agrees she is ready for dc.  Pt has been advised of the symptoms that warrant their return to the ED. Patient has voiced understanding and has agreed to follow-up with the PCP or specialist.         Linus Mako, [place] 04/06/12 2044

## 2012-04-06 NOTE — ED Notes (Signed)
Pt presents to department with husband, pt became weak, lethargic and difficult to arouse. Pt states she has been taking care of husband all day and didn't eat much. Pt states she feels weak and tired. She is alert and answering questions appropriately at the time.

## 2012-04-06 NOTE — ED Notes (Signed)
Pt ambulatory to the restroom independently.  

## 2012-04-06 NOTE — ED Notes (Signed)
Pt. Stated, "just been overwhelmed taking care of her husband; exhausted, tired." pt. Came around on own after placed in bed, placed on monitor.  Pt. Alert and oriented. Very concerned  About husband.

## 2012-04-06 NOTE — ED Notes (Signed)
Pt having dinner now. Denies any pain.

## 2012-04-06 NOTE — Progress Notes (Signed)
64:53 PM 78 year old woman had near syncope while here with her significant other.  No chest pain.  Lab workup negative.  Feels fully recovered.  Released.

## 2012-04-06 NOTE — ED Notes (Signed)
CBG was 168. Notified Nurse Jinny Blossom.

## 2012-04-06 NOTE — ED Provider Notes (Signed)
8:31 PM  Date: 04/06/2012  Rate: 58  Rhythm: normal sinus rhythm  QRS Axis: normal  Intervals: normal  ST/T Wave abnormalities: normal  Conduction Disutrbances:none  Narrative Interpretation: Normal EKG  Old EKG Reviewed: none available    Mylinda Latina III, MD 04/06/12 2032

## 2012-04-07 LAB — GLUCOSE, CAPILLARY
Comment 1: 294511
Glucose-Capillary: 168 mg/dL — ABNORMAL HIGH (ref 70–99)

## 2012-04-07 NOTE — ED Provider Notes (Signed)
Medical screening examination/treatment/procedure(s) were conducted as a shared visit with non-physician practitioner(s) and myself.  I personally evaluated the patient during the encounter   Mylinda Latina III, MD 04/07/12 1329

## 2012-07-21 ENCOUNTER — Other Ambulatory Visit: Payer: Self-pay | Admitting: Internal Medicine

## 2012-07-21 DIAGNOSIS — Z1231 Encounter for screening mammogram for malignant neoplasm of breast: Secondary | ICD-10-CM

## 2012-08-29 ENCOUNTER — Ambulatory Visit
Admission: RE | Admit: 2012-08-29 | Discharge: 2012-08-29 | Disposition: A | Discharge status: Home / Self Care | Payer: Medicare Other | Source: Ambulatory Visit | Attending: Internal Medicine | Admitting: Internal Medicine

## 2012-08-29 DIAGNOSIS — Z1231 Encounter for screening mammogram for malignant neoplasm of breast: Secondary | ICD-10-CM

## 2012-11-09 ENCOUNTER — Emergency Department (HOSPITAL_COMMUNITY)
Admission: EM | Admit: 2012-11-09 | Discharge: 2012-11-09 | Disposition: A | Discharge status: Home / Self Care | Payer: Medicare Other | Attending: Emergency Medicine | Admitting: Emergency Medicine

## 2012-11-09 ENCOUNTER — Encounter (HOSPITAL_COMMUNITY): Payer: Self-pay | Admitting: Emergency Medicine

## 2012-11-09 DIAGNOSIS — F3289 Other specified depressive episodes: Secondary | ICD-10-CM | POA: Insufficient documentation

## 2012-11-09 DIAGNOSIS — R42 Dizziness and giddiness: Secondary | ICD-10-CM | POA: Insufficient documentation

## 2012-11-09 DIAGNOSIS — E119 Type 2 diabetes mellitus without complications: Secondary | ICD-10-CM | POA: Insufficient documentation

## 2012-11-09 DIAGNOSIS — Z79899 Other long term (current) drug therapy: Secondary | ICD-10-CM | POA: Insufficient documentation

## 2012-11-09 DIAGNOSIS — I10 Essential (primary) hypertension: Secondary | ICD-10-CM | POA: Insufficient documentation

## 2012-11-09 DIAGNOSIS — G4730 Sleep apnea, unspecified: Secondary | ICD-10-CM | POA: Insufficient documentation

## 2012-11-09 DIAGNOSIS — F329 Major depressive disorder, single episode, unspecified: Secondary | ICD-10-CM | POA: Insufficient documentation

## 2012-11-09 DIAGNOSIS — T43205A Adverse effect of unspecified antidepressants, initial encounter: Secondary | ICD-10-CM | POA: Insufficient documentation

## 2012-11-09 DIAGNOSIS — Z8679 Personal history of other diseases of the circulatory system: Secondary | ICD-10-CM | POA: Insufficient documentation

## 2012-11-09 DIAGNOSIS — T50905A Adverse effect of unspecified drugs, medicaments and biological substances, initial encounter: Secondary | ICD-10-CM

## 2012-11-09 DIAGNOSIS — Z794 Long term (current) use of insulin: Secondary | ICD-10-CM | POA: Insufficient documentation

## 2012-11-09 DIAGNOSIS — Z7982 Long term (current) use of aspirin: Secondary | ICD-10-CM | POA: Insufficient documentation

## 2012-11-09 DIAGNOSIS — R531 Weakness: Secondary | ICD-10-CM

## 2012-11-09 DIAGNOSIS — F411 Generalized anxiety disorder: Secondary | ICD-10-CM | POA: Insufficient documentation

## 2012-11-09 DIAGNOSIS — Z8673 Personal history of transient ischemic attack (TIA), and cerebral infarction without residual deficits: Secondary | ICD-10-CM | POA: Insufficient documentation

## 2012-11-09 DIAGNOSIS — R5381 Other malaise: Secondary | ICD-10-CM | POA: Insufficient documentation

## 2012-11-09 DIAGNOSIS — M129 Arthropathy, unspecified: Secondary | ICD-10-CM | POA: Insufficient documentation

## 2012-11-09 DIAGNOSIS — Z9989 Dependence on other enabling machines and devices: Secondary | ICD-10-CM | POA: Insufficient documentation

## 2012-11-09 LAB — URINALYSIS, ROUTINE W REFLEX MICROSCOPIC
Bilirubin Urine: NEGATIVE
Glucose, UA: NEGATIVE mg/dL
Hgb urine dipstick: NEGATIVE
Ketones, ur: NEGATIVE mg/dL
Nitrite: NEGATIVE
Protein, ur: NEGATIVE mg/dL
Specific Gravity, Urine: 1.01 (ref 1.005–1.030)
Urobilinogen, UA: 0.2 mg/dL (ref 0.0–1.0)
pH: 6 (ref 5.0–8.0)

## 2012-11-09 LAB — COMPREHENSIVE METABOLIC PANEL
ALT: 17 U/L (ref 0–35)
AST: 33 U/L (ref 0–37)
Albumin: 3.2 g/dL — ABNORMAL LOW (ref 3.5–5.2)
Alkaline Phosphatase: 68 U/L (ref 39–117)
BUN: 24 mg/dL — ABNORMAL HIGH (ref 6–23)
CO2: 29 mEq/L (ref 19–32)
Calcium: 9.1 mg/dL (ref 8.4–10.5)
Chloride: 102 mEq/L (ref 96–112)
Creatinine, Ser: 1.07 mg/dL (ref 0.50–1.10)
GFR calc Af Amer: 54 mL/min — ABNORMAL LOW (ref >90)
GFR calc non Af Amer: 46 mL/min — ABNORMAL LOW (ref >90)
Glucose, Bld: 161 mg/dL — ABNORMAL HIGH (ref 70–99)
Potassium: 3.9 mEq/L (ref 3.5–5.1)
Sodium: 139 mEq/L (ref 135–145)
Total Bilirubin: 0.2 mg/dL — ABNORMAL LOW (ref 0.3–1.2)
Total Protein: 6.7 g/dL (ref 6.0–8.3)

## 2012-11-09 LAB — CBC
HCT: 31.8 % — ABNORMAL LOW (ref 36.0–46.0)
Hemoglobin: 10.3 g/dL — ABNORMAL LOW (ref 12.0–15.0)
MCH: 28.1 pg (ref 26.0–34.0)
MCHC: 32.4 g/dL (ref 30.0–36.0)
MCV: 86.6 fL (ref 78.0–100.0)
Platelets: 147 10*3/uL — ABNORMAL LOW (ref 150–400)
RBC: 3.67 MIL/uL — ABNORMAL LOW (ref 3.87–5.11)
RDW: 14.9 % (ref 11.5–15.5)
WBC: 7 10*3/uL (ref 4.0–10.5)

## 2012-11-09 LAB — URINE MICROSCOPIC-ADD ON

## 2012-11-09 MED ORDER — SODIUM CHLORIDE 0.9 % IV BOLUS (SEPSIS)
500.0000 mL | Freq: Once | INTRAVENOUS | Status: AC
  Administered 2012-11-09: 500 mL via INTRAVENOUS

## 2012-11-09 NOTE — ED Notes (Signed)
Pt reports she was started on Lexapro on 02/24 and has had increasing weakness since starting the med.  Pt denies pain, N/V.  Pt feels that it is related to this medicine.

## 2012-11-09 NOTE — ED Provider Notes (Signed)
History     CSN: [id number]  Arrival date & time 11/09/12  1445   First MD Initiated Contact with Patient 11/09/12 1601      Chief Complaint  Patient presents with  . Weakness    (Consider location/radiation/quality/duration/timing/severity/associated sxs/prior treatment) HPI Pt presents with c/o generalized weakness.  She states her symptoms started after starting on lexapro on 4 days agofor anxiety.  She feels fatigued and lightheaded.  No nausea or vomiting.  No headache, no chest pain or shortness of breath.  She has taken 4 days of the medication.  She denies fever/chills, no other new medicaitons.  No sob on exertion.  No focal weakness.  No changes in speech or vision.  She talked with the pharmacist about whether this could be a side effect of the medication and was advised to come to the ED. There are no other associated systemic symptoms, there are no other alleviating or modifying factors.   Past Medical History  Diagnosis Date  . Diabetes mellitus   . Hypertension   . Sleep apnea     cpap  . TIA (transient ischemic attack)   . Heart murmur   . Stroke     3 x tias  . Blood transfusion   . Arthritis   . Anxiety   . Depression     Past Surgical History  Procedure Laterality Date  . Partial hysterectomy    . Abdominal hysterectomy    . Appendectomy      Family History  Problem Relation Age of Onset  . Prostate cancer      History  Substance Use Topics  . Smoking status: Never Smoker   . Smokeless tobacco: Not on file  . Alcohol Use: No    OB History   Grav Para Term Preterm Abortions TAB SAB Ect Mult Living                  Review of Systems ROS reviewed and all otherwise negative except for mentioned in HPI Allergies  Ace inhibitors  Home Medications   Current Outpatient Rx  Name  Route  Sig  Dispense  Refill  . amLODipine (NORVASC) 5 MG tablet   Oral   Take 2.5 mg by mouth every morning.          Marland Kitchen aspirin 325 MG EC tablet   Oral    Take 325 mg by mouth at bedtime.          Marland Kitchen atorvastatin (LIPITOR) 40 MG tablet   Oral   Take 40 mg by mouth daily.         . Cholecalciferol (VITAMIN D-3) 1000 UNITS CAPS   Oral   Take 1 capsule by mouth daily.         . clonazePAM (KLONOPIN) 0.5 MG tablet   Oral   Take 0.5 mg by mouth at bedtime.         . donepezil (ARICEPT) 10 MG tablet   Oral   Take 10 mg by mouth daily.         Marland Kitchen escitalopram (LEXAPRO) 10 MG tablet   Oral   Take 10 mg by mouth daily.         Marland Kitchen ezetimibe (ZETIA) 10 MG tablet   Oral   Take 10 mg by mouth at bedtime.         Marland Kitchen FA-Pyridoxine-Cyancobalamin (FOLBIC PO)   Oral   Take 1 tablet by mouth daily.         Marland Kitchen  furosemide (LASIX) 40 MG tablet   Oral   Take 40 mg by mouth 2 (two) times daily.         . hydrALAZINE (APRESOLINE) 25 MG tablet   Oral   Take 25 mg by mouth 3 (three) times daily.         . insulin glargine (LANTUS) 100 UNIT/ML injection   Subcutaneous   Inject 18 Units into the skin at bedtime.         . isosorbide mononitrate (IMDUR) 30 MG 24 hr tablet   Oral   Take 30 mg by mouth daily.         Marland Kitchen loratadine (CLARITIN) 10 MG tablet   Oral   Take 10 mg by mouth every morning.         . potassium chloride (K-DUR) 10 MEQ tablet   Oral   Take 10 mEq by mouth 2 (two) times daily.         Marland Kitchen telmisartan (MICARDIS) 80 MG tablet   Oral   Take 80 mg by mouth daily.           BP 105/55  Pulse 52  Temp(Src) 98.1 F (36.7 C) (Oral)  Resp 20  SpO2 100% Vitals reviewed Physical Exam Physical Examination: General appearance - alert, well appearing, and in no distress Mental status - alert, oriented to person, place, and time Eyes - pupils equal and reactive, extraocular eye movements intact Mouth - mucous membranes moist, pharynx normal without lesions Chest - clear to auscultation, no wheezes, rales or rhonchi, symmetric air entry Heart - normal rate, regular rhythm, normal S1, S2, no murmurs,  rubs, clicks or gallops Abdomen - soft, nontender, nondistended, no masses or organomegaly Neurological - alert, oriented, normal speech, cranial nerves 2-12 tested and intact, strength 5/5 in extremities x 4, sensation intact, no tremor Extremities - peripheral pulses normal, no pedal edema, no clubbing or cyanosis Skin - normal coloration and turgor, no rashes  ED Course  Procedures (including critical care time)   Date: 11/09/2012  Rate: 65  Rhythm: normal sinus rhythm  QRS Axis: normal  Intervals: normal  ST/T Wave abnormalities: normal  Conduction Disutrbances: none  Narrative Interpretation: no significant changes compared to prior ekg of 04/06/12      Labs Reviewed  CBC - Abnormal; Notable for the following:    RBC 3.67 (*)    Hemoglobin 10.3 (*)    HCT 31.8 (*)    Platelets 147 (*)    All other components within normal limits  COMPREHENSIVE METABOLIC PANEL - Abnormal; Notable for the following:    Glucose, Bld 161 (*)    BUN 24 (*)    Albumin 3.2 (*)    Total Bilirubin 0.2 (*)    GFR calc non Af Amer 46 (*)    GFR calc Af Amer 54 (*)    All other components within normal limits  URINALYSIS, ROUTINE W REFLEX MICROSCOPIC - Abnormal; Notable for the following:    Leukocytes, UA SMALL (*)    All other components within normal limits  URINE MICROSCOPIC-ADD ON   No results found.   1. Weakness   2. Medication side effect, initial encounter       MDM  Pt presenting with c/o generalized weakness after starting lexapro 4 days ago.  EKG and other labs incuding urine were reassuring in the ED, neurologic exam normal.  This may be due to medication.  There is no other acute emergent process at this time requiring  further evaluation.  I have advised her to d/w her PMD about whether to make any changes in her medications.  Discharged with strict return precautions.  Pt agreeable with plan.        Threasa Beards, MD 11/11/12 [phone]

## 2012-11-09 NOTE — ED Notes (Signed)
Pt waiting for PTAR in the hall way.

## 2013-01-05 ENCOUNTER — Ambulatory Visit (INDEPENDENT_AMBULATORY_CARE_PROVIDER_SITE_OTHER): Payer: Medicare Other | Admitting: Ophthalmology

## 2013-01-05 DIAGNOSIS — H3530 Unspecified macular degeneration: Secondary | ICD-10-CM

## 2013-01-05 DIAGNOSIS — E1165 Type 2 diabetes mellitus with hyperglycemia: Secondary | ICD-10-CM

## 2013-01-05 DIAGNOSIS — H18519 Endothelial corneal dystrophy, unspecified eye: Secondary | ICD-10-CM

## 2013-01-05 DIAGNOSIS — H35039 Hypertensive retinopathy, unspecified eye: Secondary | ICD-10-CM

## 2013-01-05 DIAGNOSIS — H2700 Aphakia, unspecified eye: Secondary | ICD-10-CM

## 2013-01-05 DIAGNOSIS — E11319 Type 2 diabetes mellitus with unspecified diabetic retinopathy without macular edema: Secondary | ICD-10-CM

## 2013-01-05 DIAGNOSIS — E1139 Type 2 diabetes mellitus with other diabetic ophthalmic complication: Secondary | ICD-10-CM

## 2013-01-05 DIAGNOSIS — I10 Essential (primary) hypertension: Secondary | ICD-10-CM

## 2013-01-22 ENCOUNTER — Encounter: Payer: Self-pay | Admitting: *Deleted

## 2013-01-23 ENCOUNTER — Ambulatory Visit (INDEPENDENT_AMBULATORY_CARE_PROVIDER_SITE_OTHER): Payer: Medicare Other | Admitting: Cardiovascular Disease

## 2013-01-23 ENCOUNTER — Encounter: Payer: Self-pay | Admitting: Cardiovascular Disease

## 2013-01-23 VITALS — BP 142/60 | HR 78 | Ht 62.0 in | Wt 200.0 lb

## 2013-01-23 DIAGNOSIS — R609 Edema, unspecified: Secondary | ICD-10-CM

## 2013-01-23 DIAGNOSIS — E785 Hyperlipidemia, unspecified: Secondary | ICD-10-CM | POA: Insufficient documentation

## 2013-01-23 DIAGNOSIS — R600 Localized edema: Secondary | ICD-10-CM

## 2013-01-23 DIAGNOSIS — R079 Chest pain, unspecified: Secondary | ICD-10-CM

## 2013-01-23 DIAGNOSIS — I10 Essential (primary) hypertension: Secondary | ICD-10-CM

## 2013-01-23 DIAGNOSIS — Z79899 Other long term (current) drug therapy: Secondary | ICD-10-CM

## 2013-01-23 MED ORDER — FA-PYRIDOXINE-CYANOCOBALAMIN 2.5-25-2 MG PO TABS: 1.0000 | ORAL_TABLET | Freq: Every day | ORAL | Status: DC

## 2013-01-23 MED ORDER — METOLAZONE 2.5 MG PO TABS: 2.5000 mg | ORAL_TABLET | ORAL | Status: DC

## 2013-01-23 MED ORDER — HYDRALAZINE HCL 25 MG PO TABS: 25.0000 mg | ORAL_TABLET | Freq: Three times a day (TID) | ORAL | Status: DC

## 2013-01-23 NOTE — Assessment & Plan Note (Signed)
I have put her on metolazone twice a week which resulted in improvement in her edema however because of ongoing dietary indiscretion she still has 2+ pitting edema as well as shortness of breath

## 2013-01-23 NOTE — Progress Notes (Signed)
01/23/2013 Maria [name]   1926-04-02  [id number]  Primary Physician Maximino Greenland, MD Primary Cardiologist: Lorretta Harp MD Renae Gloss .  HPI:  The patient is an 79 year old, moderately overweight, widowed Caucasian female, mother of 2, grandmother to 2 grandchildren who I last saw 4 months ago. She is accompanied by one of her caregivers today. Her problems include hypertension, hyperlipidemia, diabetes and obstructive sleep apnea on CPAP. She has normal coronary arteries and normal LV function by cath that I performed February of this year. She does have diastolic dysfunction. When I saw her back in February she was complaining of dyspnea and I did begin her on Zaroxolyn twice a week which resulted in some improvement in her symptoms as well as in her lower extremity edema, though, today she clearly admits to dietary indiscretion with regards to salt. She was admitted to [facility] for 4 days last month because of diverticulitis. Since her last office visit she continues to have dietary indiscretion related to salt with significant lower extremity edema and shortness of breath as well as palpitations. She does complain of occasional episodes of substernal chest pain but she has normal coronary arteries by cath which I performed 11/08/2010.   Current Outpatient Prescriptions  Medication Sig Dispense Refill  . amLODipine (NORVASC) 5 MG tablet Take 2.5 mg by mouth every morning.       Marland Kitchen aspirin 325 MG EC tablet Take 325 mg by mouth at bedtime.       Marland Kitchen atorvastatin (LIPITOR) 40 MG tablet Take 40 mg by mouth daily.      . Cholecalciferol (VITAMIN D-3) 1000 UNITS CAPS Take 1 capsule by mouth daily.      . citalopram (CELEXA) 20 MG tablet Take 20 mg by mouth daily.      . clonazePAM (KLONOPIN) 0.5 MG tablet Take 0.5 mg by mouth at bedtime.      . donepezil (ARICEPT) 10 MG tablet Take 10 mg by mouth daily.      Marland Kitchen ezetimibe (ZETIA) 10 MG tablet Take 10 mg by mouth at bedtime.       . folic acid-pyridoxine-cyancobalamin (FOLBIC) 2.5-25-2 MG TABS Take 1 tablet by mouth daily.  90 each  3  . furosemide (LASIX) 40 MG tablet Take 40 mg by mouth 2 (two) times daily.      . hydrALAZINE (APRESOLINE) 25 MG tablet Take 1 tablet (25 mg total) by mouth 3 (three) times daily.  270 tablet  3  . insulin glargine (LANTUS) 100 UNIT/ML injection Inject 18 Units into the skin at bedtime.      . isosorbide mononitrate (IMDUR) 30 MG 24 hr tablet Take 30 mg by mouth daily.      Marland Kitchen loratadine (CLARITIN) 10 MG tablet Take 10 mg by mouth every morning.      . metolazone (ZAROXOLYN) 2.5 MG tablet Take 1 tablet (2.5 mg total) by mouth as directed. Two times every week  10 tablet  6  . potassium chloride (K-DUR) 10 MEQ tablet Take 10 mEq by mouth 2 (two) times daily.      Marland Kitchen telmisartan (MICARDIS) 80 MG tablet Take 80 mg by mouth daily.       No current facility-administered medications for this visit.    Allergies  Allergen Reactions  . Ace Inhibitors Other (See Comments)    Angioedema   . Diovan (Valsartan)     History   Social History  . Marital Status: Widowed    Spouse Name: N/A  Number of Children: N/A  . Years of Education: N/A   Occupational History  . Not on file.   Social History Main Topics  . Smoking status: Never Smoker   . Smokeless tobacco: Not on file  . Alcohol Use: No  . Drug Use: No  . Sexually Active: Not on file   Other Topics Concern  . Not on file   Social History Narrative  . No narrative on file     Review of Systems: General: negative for chills, fever, night sweats or weight changes.  Cardiovascular: negative for chest pain, dyspnea on exertion, edema, orthopnea, palpitations, paroxysmal nocturnal dyspnea or shortness of breath Dermatological: negative for rash Respiratory: negative for cough or wheezing Urologic: negative for hematuria Abdominal: negative for nausea, vomiting, diarrhea, bright red blood per rectum, melena, or  hematemesis Neurologic: negative for visual changes, syncope, or dizziness All other systems reviewed and are otherwise negative except as noted above.    Blood pressure 142/60, pulse 78, height 5\' 2"  (1.575 m), weight 200 lb (90.719 kg).  General appearance: alert and no distress Neck: no adenopathy, no JVD, supple, symmetrical, trachea midline, thyroid not enlarged, symmetric, no tenderness/mass/nodules and soft bilateral carotid bruits Lungs: 2/6 soft outflow tract murmur Heart: 2/6 soft outflow tract murmur Extremities: edema 2+ pitting edema bilaterally  EKG normal sinus rhythm at 78 without ST or T wave changes  ASSESSMENT AND PLAN:   Bilateral lower extremity edema I have put her on metolazone twice a week which resulted in improvement in her edema however because of ongoing dietary indiscretion she still has 2+ pitting edema as well as shortness of breath  Hypertension Controlled on current medications. The patient has been made aware of the importance of salt restriction.  Hyperlipidemia Controlled on a statin drug and Zetia. Her last lipid profile in our chart from a year ago revealed a total cholesterol of 155, LDL of 81 and HDL of 60.      [address] JMD, Lupe Carney, [place] 01/23/2013 10:27 AM

## 2013-01-23 NOTE — Patient Instructions (Signed)
  Your physician wants you to follow-up in: 6 MONTHS WITH EXTENDER AND 12 MONTHS WITH DR Gwenlyn Found  You will receive a reminder letter in the mail one month in advance. If you don't receive a letter, please call our office to schedule the follow-up appointment.   Your physician recommends that you return for lab work in: [place] has recommended you make the following change in your medication: [place] 2.5MG  EVERY OTHER DAY

## 2013-01-23 NOTE — Assessment & Plan Note (Signed)
Controlled on current medications. The patient has been made aware of the importance of salt restriction.

## 2013-01-23 NOTE — Assessment & Plan Note (Signed)
Controlled on a statin drug and Zetia. Her last lipid profile in our chart from a year ago revealed a total cholesterol of 155, LDL of 81 and HDL of 60.

## 2013-02-03 LAB — BASIC METABOLIC PANEL
BUN: 33 mg/dL — ABNORMAL HIGH (ref 6–23)
CO2: 27 mEq/L (ref 19–32)
Calcium: 9.1 mg/dL (ref 8.4–10.5)
Chloride: 103 mEq/L (ref 96–112)
Creat: 1.26 mg/dL — ABNORMAL HIGH (ref 0.50–1.10)
Glucose, Bld: 96 mg/dL (ref 70–99)
Potassium: 3.7 mEq/L (ref 3.5–5.3)
Sodium: 142 mEq/L (ref 135–145)

## 2013-02-04 ENCOUNTER — Telehealth: Payer: Self-pay | Admitting: Cardiovascular Disease

## 2013-02-04 NOTE — Telephone Encounter (Signed)
The first page of the requisition is missing and Margo at [facility] wants to know if all the labs were ordered. Please call a@ [phone]

## 2013-02-04 NOTE — Telephone Encounter (Signed)
Returned call and spoke w/ a Therapist, art rep.  Rep unsure of reason for call and checked w/ Margo.  Spoke w/ Audelia Acton who stated this has already been resolved.

## 2013-03-04 ENCOUNTER — Emergency Department (HOSPITAL_COMMUNITY): Payer: Medicare Other

## 2013-03-04 ENCOUNTER — Inpatient Hospital Stay (HOSPITAL_COMMUNITY)
Admission: EM | Admit: 2013-03-04 | Discharge: 2013-03-06 | DRG: 392 | Disposition: A | Discharge status: Home Health | Payer: Medicare Other | Attending: Family Medicine | Admitting: Family Medicine

## 2013-03-04 ENCOUNTER — Encounter (HOSPITAL_COMMUNITY): Payer: Self-pay | Admitting: Emergency Medicine

## 2013-03-04 DIAGNOSIS — E669 Obesity, unspecified: Secondary | ICD-10-CM

## 2013-03-04 DIAGNOSIS — F329 Major depressive disorder, single episode, unspecified: Secondary | ICD-10-CM | POA: Diagnosis present

## 2013-03-04 DIAGNOSIS — R609 Edema, unspecified: Secondary | ICD-10-CM

## 2013-03-04 DIAGNOSIS — E785 Hyperlipidemia, unspecified: Secondary | ICD-10-CM | POA: Diagnosis present

## 2013-03-04 DIAGNOSIS — M129 Arthropathy, unspecified: Secondary | ICD-10-CM | POA: Diagnosis present

## 2013-03-04 DIAGNOSIS — Z7982 Long term (current) use of aspirin: Secondary | ICD-10-CM

## 2013-03-04 DIAGNOSIS — Z87898 Personal history of other specified conditions: Secondary | ICD-10-CM

## 2013-03-04 DIAGNOSIS — Z8673 Personal history of transient ischemic attack (TIA), and cerebral infarction without residual deficits: Secondary | ICD-10-CM

## 2013-03-04 DIAGNOSIS — I10 Essential (primary) hypertension: Secondary | ICD-10-CM | POA: Diagnosis present

## 2013-03-04 DIAGNOSIS — R944 Abnormal results of kidney function studies: Secondary | ICD-10-CM | POA: Diagnosis present

## 2013-03-04 DIAGNOSIS — Z79899 Other long term (current) drug therapy: Secondary | ICD-10-CM

## 2013-03-04 DIAGNOSIS — Z9989 Dependence on other enabling machines and devices: Secondary | ICD-10-CM

## 2013-03-04 DIAGNOSIS — K5792 Diverticulitis of intestine, part unspecified, without perforation or abscess without bleeding: Secondary | ICD-10-CM | POA: Diagnosis present

## 2013-03-04 DIAGNOSIS — Z794 Long term (current) use of insulin: Secondary | ICD-10-CM

## 2013-03-04 DIAGNOSIS — F411 Generalized anxiety disorder: Secondary | ICD-10-CM | POA: Diagnosis present

## 2013-03-04 DIAGNOSIS — K5732 Diverticulitis of large intestine without perforation or abscess without bleeding: Principal | ICD-10-CM

## 2013-03-04 DIAGNOSIS — E119 Type 2 diabetes mellitus without complications: Secondary | ICD-10-CM | POA: Diagnosis present

## 2013-03-04 DIAGNOSIS — R600 Localized edema: Secondary | ICD-10-CM

## 2013-03-04 DIAGNOSIS — I119 Hypertensive heart disease without heart failure: Secondary | ICD-10-CM | POA: Diagnosis present

## 2013-03-04 DIAGNOSIS — F3289 Other specified depressive episodes: Secondary | ICD-10-CM | POA: Diagnosis present

## 2013-03-04 DIAGNOSIS — G4733 Obstructive sleep apnea (adult) (pediatric): Secondary | ICD-10-CM | POA: Diagnosis present

## 2013-03-04 LAB — CBC WITH DIFFERENTIAL/PLATELET
Basophils Absolute: 0 10*3/uL (ref 0.0–0.1)
Basophils Relative: 0 % (ref 0–1)
Eosinophils Absolute: 0.1 10*3/uL (ref 0.0–0.7)
Eosinophils Relative: 1 % (ref 0–5)
HCT: 32.2 % — ABNORMAL LOW (ref 36.0–46.0)
Hemoglobin: 10.2 g/dL — ABNORMAL LOW (ref 12.0–15.0)
Lymphocytes Relative: 23 % (ref 12–46)
Lymphs Abs: 1.5 10*3/uL (ref 0.7–4.0)
MCH: 27.7 pg (ref 26.0–34.0)
MCHC: 31.7 g/dL (ref 30.0–36.0)
MCV: 87.5 fL (ref 78.0–100.0)
Monocytes Absolute: 0.6 10*3/uL (ref 0.1–1.0)
Monocytes Relative: 9 % (ref 3–12)
Neutro Abs: 4.4 10*3/uL (ref 1.7–7.7)
Neutrophils Relative %: 67 % (ref 43–77)
Platelets: 173 10*3/uL (ref 150–400)
RBC: 3.68 MIL/uL — ABNORMAL LOW (ref 3.87–5.11)
RDW: 15.2 % (ref 11.5–15.5)
WBC: 6.6 10*3/uL (ref 4.0–10.5)

## 2013-03-04 LAB — URINALYSIS, ROUTINE W REFLEX MICROSCOPIC
Bilirubin Urine: NEGATIVE
Glucose, UA: NEGATIVE mg/dL
Hgb urine dipstick: NEGATIVE
Ketones, ur: NEGATIVE mg/dL
Leukocytes, UA: NEGATIVE
Nitrite: NEGATIVE
Protein, ur: NEGATIVE mg/dL
Specific Gravity, Urine: 1.017 (ref 1.005–1.030)
Urobilinogen, UA: 0.2 mg/dL (ref 0.0–1.0)
pH: 6.5 (ref 5.0–8.0)

## 2013-03-04 LAB — BASIC METABOLIC PANEL
BUN: 25 mg/dL — ABNORMAL HIGH (ref 6–23)
CO2: 30 mEq/L (ref 19–32)
Calcium: 9.5 mg/dL (ref 8.4–10.5)
Chloride: 98 mEq/L (ref 96–112)
Creatinine, Ser: 1.16 mg/dL — ABNORMAL HIGH (ref 0.50–1.10)
GFR calc Af Amer: 49 mL/min — ABNORMAL LOW (ref >90)
GFR calc non Af Amer: 42 mL/min — ABNORMAL LOW (ref >90)
Glucose, Bld: 137 mg/dL — ABNORMAL HIGH (ref 70–99)
Potassium: 3.9 mEq/L (ref 3.5–5.1)
Sodium: 138 mEq/L (ref 135–145)

## 2013-03-04 LAB — CREATININE, SERUM
Creatinine, Ser: 1.13 mg/dL — ABNORMAL HIGH (ref 0.50–1.10)
GFR calc Af Amer: 50 mL/min — ABNORMAL LOW (ref >90)
GFR calc non Af Amer: 43 mL/min — ABNORMAL LOW (ref >90)

## 2013-03-04 LAB — CBC
HCT: 30.1 % — ABNORMAL LOW (ref 36.0–46.0)
Hemoglobin: 9.8 g/dL — ABNORMAL LOW (ref 12.0–15.0)
MCH: 28.2 pg (ref 26.0–34.0)
MCHC: 32.6 g/dL (ref 30.0–36.0)
MCV: 86.7 fL (ref 78.0–100.0)
Platelets: 165 10*3/uL (ref 150–400)
RBC: 3.47 MIL/uL — ABNORMAL LOW (ref 3.87–5.11)
RDW: 15.2 % (ref 11.5–15.5)
WBC: 6.4 10*3/uL (ref 4.0–10.5)

## 2013-03-04 LAB — GLUCOSE, CAPILLARY
Glucose-Capillary: 112 mg/dL — ABNORMAL HIGH (ref 70–99)
Glucose-Capillary: 209 mg/dL — ABNORMAL HIGH (ref 70–99)

## 2013-03-04 MED ORDER — DONEPEZIL HCL 10 MG PO TABS
10.0000 mg | ORAL_TABLET | Freq: Every day | ORAL | Status: DC
  Administered 2013-03-04 – 2013-03-05 (×2): 10 mg via ORAL
  Filled 2013-03-04 (×3): qty 1

## 2013-03-04 MED ORDER — SODIUM CHLORIDE 0.9 % IV BOLUS (SEPSIS)
500.0000 mL | Freq: Once | INTRAVENOUS | Status: AC
  Administered 2013-03-04: 500 mL via INTRAVENOUS

## 2013-03-04 MED ORDER — HYDRALAZINE HCL 25 MG PO TABS
25.0000 mg | ORAL_TABLET | Freq: Three times a day (TID) | ORAL | Status: DC
  Administered 2013-03-04 – 2013-03-06 (×4): 25 mg via ORAL
  Filled 2013-03-04 (×8): qty 1

## 2013-03-04 MED ORDER — CIPROFLOXACIN IN D5W 400 MG/200ML IV SOLN
400.0000 mg | Freq: Once | INTRAVENOUS | Status: AC
  Administered 2013-03-04: 400 mg via INTRAVENOUS
  Filled 2013-03-04: qty 200

## 2013-03-04 MED ORDER — ONDANSETRON HCL 4 MG PO TABS: 4.0000 mg | ORAL_TABLET | Freq: Four times a day (QID) | ORAL | Status: DC | PRN

## 2013-03-04 MED ORDER — CITALOPRAM HYDROBROMIDE 40 MG PO TABS
40.0000 mg | ORAL_TABLET | Freq: Every day | ORAL | Status: DC
  Filled 2013-03-04: qty 1

## 2013-03-04 MED ORDER — MORPHINE SULFATE 2 MG/ML IJ SOLN
2.0000 mg | Freq: Once | INTRAMUSCULAR | Status: AC
  Administered 2013-03-04: 2 mg via INTRAVENOUS
  Filled 2013-03-04: qty 1

## 2013-03-04 MED ORDER — METOLAZONE 2.5 MG PO TABS
2.5000 mg | ORAL_TABLET | ORAL | Status: DC
  Administered 2013-03-06: 2.5 mg via ORAL
  Filled 2013-03-04: qty 1

## 2013-03-04 MED ORDER — METRONIDAZOLE IN NACL 5-0.79 MG/ML-% IV SOLN
500.0000 mg | Freq: Once | INTRAVENOUS | Status: AC
  Administered 2013-03-04: 500 mg via INTRAVENOUS
  Filled 2013-03-04: qty 100

## 2013-03-04 MED ORDER — SODIUM CHLORIDE 0.9 % IJ SOLN
3.0000 mL | Freq: Two times a day (BID) | INTRAMUSCULAR | Status: DC
  Administered 2013-03-04 – 2013-03-06 (×5): 3 mL via INTRAVENOUS

## 2013-03-04 MED ORDER — AMLODIPINE BESYLATE 2.5 MG PO TABS
2.5000 mg | ORAL_TABLET | Freq: Every morning | ORAL | Status: DC
  Administered 2013-03-05 – 2013-03-06 (×2): 2.5 mg via ORAL
  Filled 2013-03-04 (×2): qty 1

## 2013-03-04 MED ORDER — DOCUSATE SODIUM 100 MG PO CAPS
100.0000 mg | ORAL_CAPSULE | Freq: Every day | ORAL | Status: DC | PRN
  Filled 2013-03-04: qty 1

## 2013-03-04 MED ORDER — OXYCODONE HCL 5 MG PO TABS
5.0000 mg | ORAL_TABLET | ORAL | Status: DC | PRN
  Administered 2013-03-05 – 2013-03-06 (×2): 5 mg via ORAL
  Filled 2013-03-04 (×4): qty 1

## 2013-03-04 MED ORDER — EZETIMIBE 10 MG PO TABS
10.0000 mg | ORAL_TABLET | Freq: Every day | ORAL | Status: DC
  Administered 2013-03-04 – 2013-03-05 (×2): 10 mg via ORAL
  Filled 2013-03-04 (×3): qty 1

## 2013-03-04 MED ORDER — INSULIN GLARGINE 100 UNIT/ML ~~LOC~~ SOLN
8.0000 [IU] | Freq: Every day | SUBCUTANEOUS | Status: DC
  Administered 2013-03-04 – 2013-03-05 (×2): 8 [IU] via SUBCUTANEOUS
  Filled 2013-03-04 (×3): qty 0.08

## 2013-03-04 MED ORDER — FA-PYRIDOXINE-CYANOCOBALAMIN 2.5-25-2 MG PO TABS
1.0000 | ORAL_TABLET | Freq: Every day | ORAL | Status: DC
  Administered 2013-03-05 – 2013-03-06 (×2): 1 via ORAL
  Filled 2013-03-04 (×2): qty 1

## 2013-03-04 MED ORDER — CIPROFLOXACIN IN D5W 400 MG/200ML IV SOLN
400.0000 mg | Freq: Two times a day (BID) | INTRAVENOUS | Status: DC
  Administered 2013-03-04: 400 mg via INTRAVENOUS
  Filled 2013-03-04 (×2): qty 200

## 2013-03-04 MED ORDER — SODIUM CHLORIDE 0.9 % IJ SOLN: 3.0000 mL | INTRAMUSCULAR | Status: DC | PRN

## 2013-03-04 MED ORDER — ASPIRIN EC 325 MG PO TBEC: 325.0000 mg | DELAYED_RELEASE_TABLET | Freq: Every day | ORAL | Status: DC

## 2013-03-04 MED ORDER — ATORVASTATIN CALCIUM 40 MG PO TABS
40.0000 mg | ORAL_TABLET | Freq: Every day | ORAL | Status: DC
  Administered 2013-03-04 – 2013-03-06 (×3): 40 mg via ORAL
  Filled 2013-03-04 (×3): qty 1

## 2013-03-04 MED ORDER — MORPHINE SULFATE 4 MG/ML IJ SOLN
4.0000 mg | Freq: Once | INTRAMUSCULAR | Status: AC
  Administered 2013-03-04: 4 mg via INTRAVENOUS
  Filled 2013-03-04: qty 1

## 2013-03-04 MED ORDER — DEXTROSE-NACL 5-0.45 % IV SOLN
INTRAVENOUS | Status: AC
  Administered 2013-03-04: 100 mL/h via INTRAVENOUS

## 2013-03-04 MED ORDER — IOHEXOL 300 MG/ML  SOLN
100.0000 mL | Freq: Once | INTRAMUSCULAR | Status: AC | PRN
  Administered 2013-03-04: 100 mL via INTRAVENOUS

## 2013-03-04 MED ORDER — MORPHINE SULFATE 2 MG/ML IJ SOLN
1.0000 mg | INTRAMUSCULAR | Status: DC | PRN
  Administered 2013-03-04 – 2013-03-05 (×5): 1 mg via INTRAVENOUS
  Filled 2013-03-04 (×5): qty 1

## 2013-03-04 MED ORDER — IOHEXOL 300 MG/ML  SOLN
50.0000 mL | Freq: Once | INTRAMUSCULAR | Status: AC | PRN
  Administered 2013-03-04: 50 mL via ORAL

## 2013-03-04 MED ORDER — SODIUM CHLORIDE 0.9 % IV SOLN: 250.0000 mL | INTRAVENOUS | Status: DC | PRN

## 2013-03-04 MED ORDER — ISOSORBIDE MONONITRATE ER 30 MG PO TB24
30.0000 mg | ORAL_TABLET | Freq: Every day | ORAL | Status: DC
  Administered 2013-03-04 – 2013-03-06 (×3): 30 mg via ORAL
  Filled 2013-03-04 (×3): qty 1

## 2013-03-04 MED ORDER — HEPARIN SODIUM (PORCINE) 5000 UNIT/ML IJ SOLN
5000.0000 [IU] | Freq: Three times a day (TID) | INTRAMUSCULAR | Status: DC
  Administered 2013-03-04 – 2013-03-06 (×4): 5000 [IU] via SUBCUTANEOUS
  Filled 2013-03-04 (×8): qty 1

## 2013-03-04 MED ORDER — ASPIRIN EC 325 MG PO TBEC
325.0000 mg | DELAYED_RELEASE_TABLET | Freq: Every day | ORAL | Status: DC
  Administered 2013-03-04 – 2013-03-05 (×2): 325 mg via ORAL
  Filled 2013-03-04 (×4): qty 1

## 2013-03-04 MED ORDER — ONDANSETRON HCL 4 MG/2ML IJ SOLN
4.0000 mg | Freq: Four times a day (QID) | INTRAMUSCULAR | Status: DC | PRN
  Administered 2013-03-04: 4 mg via INTRAVENOUS
  Filled 2013-03-04 (×2): qty 2

## 2013-03-04 MED ORDER — METRONIDAZOLE IN NACL 5-0.79 MG/ML-% IV SOLN
500.0000 mg | Freq: Three times a day (TID) | INTRAVENOUS | Status: DC
  Administered 2013-03-04 – 2013-03-05 (×2): 500 mg via INTRAVENOUS
  Filled 2013-03-04 (×3): qty 100

## 2013-03-04 MED ORDER — ONDANSETRON HCL 4 MG/2ML IJ SOLN
4.0000 mg | Freq: Once | INTRAMUSCULAR | Status: AC
  Administered 2013-03-04: 4 mg via INTRAVENOUS
  Filled 2013-03-04: qty 2

## 2013-03-04 MED ORDER — CLONAZEPAM 0.5 MG PO TABS
0.5000 mg | ORAL_TABLET | Freq: Every day | ORAL | Status: DC
  Administered 2013-03-04 – 2013-03-05 (×2): 0.5 mg via ORAL
  Filled 2013-03-04 (×2): qty 1

## 2013-03-04 MED ORDER — INSULIN ASPART 100 UNIT/ML ~~LOC~~ SOLN
0.0000 [IU] | Freq: Three times a day (TID) | SUBCUTANEOUS | Status: DC
  Administered 2013-03-04: 5 [IU] via SUBCUTANEOUS
  Administered 2013-03-05: 3 [IU] via SUBCUTANEOUS

## 2013-03-04 NOTE — ED Notes (Signed)
Patient transported to CT 

## 2013-03-04 NOTE — ED Provider Notes (Signed)
History    CSN: [id number] Arrival date & time 03/04/13  0800  First MD Initiated Contact with Patient 03/04/13 0802     Chief Complaint  Patient presents with  . Abdominal Pain   (Consider location/radiation/quality/duration/timing/severity/associated sxs/prior Treatment) Patient is a 79 y.o. female presenting with abdominal pain. The history is provided by the patient.  Abdominal Pain This is a new problem. Associated symptoms include abdominal pain. Pertinent negatives include no chest pain, no headaches and no shortness of breath.  patient presents with left-sided abdominal pain. His been going on for 2 days. It waxes and wanes but does not go way. She states she's been urinating more frequently. No nausea vomiting diarrhea. No fevers. She's had a decreased appetite. She states she took some water with her pills today. No bleeding. She is still passing gas. Past Medical History  Diagnosis Date  . Diabetes mellitus   . Hypertension   . Sleep apnea     cpap  . TIA (transient ischemic attack)   . Heart murmur   . Stroke     3 x tias  . Blood transfusion   . Arthritis   . Anxiety   . Depression   . Edema    Past Surgical History  Procedure Laterality Date  . Partial hysterectomy    . Abdominal hysterectomy    . Appendectomy    . Transthoracic echocardiogram  [id number]  . Nm myoview ltd  [id number]    post stress left ventricle is normal in size, ejection fraction is 65%, normal myocardial perfusion study, low risk scan   Family History  Problem Relation Age of Onset  . Prostate cancer     History  Substance Use Topics  . Smoking status: Never Smoker   . Smokeless tobacco: Never Used  . Alcohol Use: No   OB History   Grav Para Term Preterm Abortions TAB SAB Ect Mult Living                 Review of Systems  Constitutional: Positive for appetite change. Negative for activity change.  HENT: Negative for neck stiffness.   Eyes: Negative for pain.  Respiratory:  Negative for chest tightness and shortness of breath.   Cardiovascular: Negative for chest pain and leg swelling.  Gastrointestinal: Positive for abdominal pain. Negative for nausea, vomiting and diarrhea.  Genitourinary: Positive for frequency. Negative for flank pain.  Musculoskeletal: Negative for back pain.  Skin: Negative for rash.  Neurological: Negative for weakness, numbness and headaches.  Psychiatric/Behavioral: Negative for behavioral problems.    Allergies  Ace inhibitors and Diovan  Home Medications   No current outpatient prescriptions on file. BP 130/51  Temp(Src) 98.7 F (37.1 C) (Oral)  Resp 18  SpO2 97% Physical Exam  Nursing note and vitals reviewed. Constitutional: She is oriented to person, place, and time. She appears well-developed and well-nourished.  HENT:  Head: Normocephalic and atraumatic.  Eyes: EOM are normal. Pupils are equal, round, and reactive to light.  Neck: Normal range of motion. Neck supple.  Cardiovascular: Normal rate, regular rhythm and normal heart sounds.   No murmur heard. Pulmonary/Chest: Effort normal and breath sounds normal. No respiratory distress. She has no wheezes. She has no rales.  Abdominal: Soft. Bowel sounds are normal. She exhibits no distension. There is tenderness. There is no rebound and no guarding.  Moderate left lower quadrant tenderness. No ecchymosis.  Musculoskeletal: Normal range of motion.  Neurological: She is alert and oriented to person, place,  and time. No cranial nerve deficit.  Skin: Skin is warm and dry.  Psychiatric: She has a normal mood and affect. Her speech is normal.    ED Course  Procedures (including critical care time) Labs Reviewed  CBC WITH DIFFERENTIAL - Abnormal; Notable for the following:    RBC 3.68 (*)    Hemoglobin 10.2 (*)    HCT 32.2 (*)    All other components within normal limits  BASIC METABOLIC PANEL - Abnormal; Notable for the following:    Glucose, Bld 137 (*)    BUN  25 (*)    Creatinine, Ser 1.16 (*)    GFR calc non Af Amer 42 (*)    GFR calc Af Amer 49 (*)    All other components within normal limits  URINALYSIS, ROUTINE W REFLEX MICROSCOPIC  CBC  CREATININE, SERUM   Ct Abdomen Pelvis W Contrast  03/04/2013   *RADIOLOGY REPORT*  Clinical Data: Left lower quadrant pain.  Pelvic hematoma.  CT ABDOMEN AND PELVIS WITH CONTRAST  Technique:  Multidetector CT imaging of the abdomen and pelvis was performed following the standard protocol during bolus administration of intravenous contrast.  Contrast: 64mL OMNIPAQUE IOHEXOL 300 MG/ML  SOLN, 13mL OMNIPAQUE IOHEXOL 300 MG/ML  SOLN  Comparison: 01/31/2012.  Findings: Lung bases show no acute findings.  Heart size normal. No pericardial or pleural effusion.  A subtle low attenuation lesion in the inferior right hepatic lobe measures 8 mm, unchanged.  Liver measures approximately 18.5 cm. Liver, gallbladder, and adrenal glands are otherwise unremarkable. Small stone in the lower pole right kidney.  Bilateral renal cortical scarring.  Sub centimeter low attenuation lesions in the right kidney, as before. Kidneys, spleen, pancreas, stomach and proximal colon are otherwise unremarkable.  Mild wall thickening and subtle pericolonic inflammatory haziness are seen in the sigmoid colon.  No extraluminal air or organized fluid collection.  Hysterectomy.  Atherosclerotic calcification of the arterial vasculature without abdominal aortic aneurysm.  No pathologically enlarged lymph nodes.  No free fluid.  Probable tiny fat-containing periumbilical hernia.  No worrisome lytic or sclerotic lesions.  Postoperative and degenerative changes are seen in the spine.  IMPRESSION:  1.  Mild wall thickening and subtle pericolonic haziness about the sigmoid colon, highly suspicious for diverticulitis.  No extraluminal air or abscess. 2.  Small right renal stone without obstruction. 3.  Borderline hepatomegaly.   Original Report Authenticated By:  Lorin Picket, M.D.   1. Diverticulitis   2. Acute diverticulitis   3. Bilateral lower extremity edema   4. Diabetes mellitus   5. Hyperlipidemia   6. Hypertension     MDM  Patient with abdominal pain. Found to have diverticulitis. She has continued pain. Will admit.  Jasper Riling. Alvino Chapel, MD 03/04/13 [phone]

## 2013-03-04 NOTE — ED Notes (Signed)
Per EMS-pt c/o of left lower quadrant pain 10/10 x2 days. Denies burning and irration. AAO x4. NAD at this time.

## 2013-03-04 NOTE — ED Notes (Signed)
[id number] Expected date:<BR> Expected time:<BR> Means of arrival:<BR> Comments:<BR> LLQ pain/ IV started

## 2013-03-04 NOTE — Progress Notes (Signed)
Patient decline Mychart activation at this time

## 2013-03-04 NOTE — H&P (Signed)
[facility] History and Physical  Maria [name] [id number] DOB: 17-Nov-1925 DOA: 03/04/2013  Referring physician: Dr. Alvino Chapel PCP: Maximino Greenland, MD  Specialists: none  Chief Complaint: Abdominal pain  HPI: Maria [name] is a 79 y.o. female  Patient reports that she started to have abdominal pain since Monday.  The pain has persisted and not got any better. She characterizes the discomfort as sharp and not associated with any nausea or emesis.  Nothing she is aware of makes it better or worse.    In the ED patient was found to have Diverticulitis with no abscess or perforation reported. Given degree of abdominal discomfort we were consulted for pain management and further evaluation and recommendations.  Review of Systems: 10 point review of systems reviewed with patient and negative unless otherwise mentioned above.  Past Medical History  Diagnosis Date  . Diabetes mellitus   . Hypertension   . Sleep apnea     cpap  . TIA (transient ischemic attack)   . Heart murmur   . Stroke     3 x tias  . Blood transfusion   . Arthritis   . Anxiety   . Depression   . Edema    Past Surgical History  Procedure Laterality Date  . Partial hysterectomy    . Abdominal hysterectomy    . Appendectomy    . Transthoracic echocardiogram  [id number]  . Nm myoview ltd  [id number]    post stress left ventricle is normal in size, ejection fraction is 65%, normal myocardial perfusion study, low risk scan   Social History:  reports that she has never smoked. She has never used smokeless tobacco. She reports that she does not drink alcohol or use illicit drugs. Pt lives at home.  Can patient participate in ADLs? yes  Allergies  Allergen Reactions  . Ace Inhibitors Other (See Comments)    Angioedema   . Diovan (Valsartan)     Family History  Problem Relation Age of Onset  . Prostate cancer    None other reported.  Prior to Admission medications   Medication Sig Start Date End  Date Taking? Authorizing Provider  amLODipine (NORVASC) 5 MG tablet Take 2.5 mg by mouth every morning.    Yes Historical Provider, MD  aspirin 325 MG EC tablet Take 325 mg by mouth at bedtime.    Yes Historical Provider, MD  atorvastatin (LIPITOR) 40 MG tablet Take 40 mg by mouth daily.   Yes Historical Provider, MD  Cholecalciferol (VITAMIN D-3) 1000 UNITS CAPS Take 1 capsule by mouth daily.   Yes Historical Provider, MD  citalopram (CELEXA) 40 MG tablet Take 40 mg by mouth daily.   Yes Historical Provider, MD  clonazePAM (KLONOPIN) 0.5 MG tablet Take 0.5 mg by mouth at bedtime.   Yes Historical Provider, MD  docusate sodium (COLACE) 50 MG capsule Take by mouth 2 (two) times daily as needed for constipation.   Yes Historical Provider, MD  donepezil (ARICEPT) 10 MG tablet Take 10 mg by mouth daily.   Yes Historical Provider, MD  ezetimibe (ZETIA) 10 MG tablet Take 10 mg by mouth at bedtime.   Yes Historical Provider, MD  folic acid-pyridoxine-cyancobalamin (FOLBIC) 2.5-25-2 MG TABS Take 1 tablet by mouth daily. 01/23/13  Yes Lorretta Harp, MD  furosemide (LASIX) 40 MG tablet Take 40 mg by mouth 2 (two) times daily.   Yes Historical Provider, MD  hydrALAZINE (APRESOLINE) 25 MG tablet Take 1 tablet (25 mg total) by mouth 3 (  three) times daily. 01/23/13  Yes Lorretta Harp, MD  insulin glargine (LANTUS) 100 UNIT/ML injection Inject 18 Units into the skin at bedtime.   Yes Historical Provider, MD  isosorbide mononitrate (IMDUR) 30 MG 24 hr tablet Take 30 mg by mouth daily.   Yes Historical Provider, MD  loratadine (CLARITIN) 10 MG tablet Take 10 mg by mouth every morning.   Yes Historical Provider, MD  metolazone (ZAROXOLYN) 2.5 MG tablet Take 1 tablet (2.5 mg total) by mouth every other day. 01/23/13  Yes Lorretta Harp, MD  potassium chloride (K-DUR) 10 MEQ tablet Take 10 mEq by mouth 2 (two) times daily.   Yes Historical Provider, MD  telmisartan (MICARDIS) 80 MG tablet Take 80 mg by mouth  daily.   Yes Historical Provider, MD   Physical Exam: Filed Vitals:   03/04/13 0756 03/04/13 1032  BP: 118/69 130/51  Temp: 97.5 F (36.4 C) 98.7 F (37.1 C)  TempSrc: Oral Oral  Resp: 17 18  SpO2: 97% 97%     General:  Pt in NAD, Alert and Awake, looks uncomfortably but non toxic  Eyes: EOMI, non icteric  ENT: normal exterior appearace, dry mucous membranes  Neck: supple, no goiter  Cardiovascular: RRR, no MRG  Respiratory: CTA BL, no wheezes  Abdomen: diffuse abdominal discomfort, soft, + BS  Skin: warm and dry  Musculoskeletal: no cyanosis or clubbing  Psychiatric: patient overly emotional and teary   Neurologic: answers questions appropriately, moves all extremities equally  Labs on Admission:  Basic Metabolic Panel:  Recent Labs Lab 03/04/13 0915  NA 138  K 3.9  CL 98  CO2 30  GLUCOSE 137*  BUN 25*  CREATININE 1.16*  CALCIUM 9.5   Liver Function Tests: No results found for this basename: AST, ALT, ALKPHOS, BILITOT, PROT, ALBUMIN,  in the last 168 hours No results found for this basename: LIPASE, AMYLASE,  in the last 168 hours No results found for this basename: AMMONIA,  in the last 168 hours CBC:  Recent Labs Lab 03/04/13 0830  WBC 6.6  NEUTROABS 4.4  HGB 10.2*  HCT 32.2*  MCV 87.5  PLT 173   Cardiac Enzymes: No results found for this basename: CKTOTAL, CKMB, CKMBINDEX, TROPONINI,  in the last 168 hours  BNP (last 3 results) No results found for this basename: PROBNP,  in the last 8760 hours CBG: No results found for this basename: GLUCAP,  in the last 168 hours  Radiological Exams on Admission: Ct Abdomen Pelvis W Contrast  03/04/2013   *RADIOLOGY REPORT*  Clinical Data: Left lower quadrant pain.  Pelvic hematoma.  CT ABDOMEN AND PELVIS WITH CONTRAST  Technique:  Multidetector CT imaging of the abdomen and pelvis was performed following the standard protocol during bolus administration of intravenous contrast.  Contrast: 87mL  OMNIPAQUE IOHEXOL 300 MG/ML  SOLN, 171mL OMNIPAQUE IOHEXOL 300 MG/ML  SOLN  Comparison: 01/31/2012.  Findings: Lung bases show no acute findings.  Heart size normal. No pericardial or pleural effusion.  A subtle low attenuation lesion in the inferior right hepatic lobe measures 8 mm, unchanged.  Liver measures approximately 18.5 cm. Liver, gallbladder, and adrenal glands are otherwise unremarkable. Small stone in the lower pole right kidney.  Bilateral renal cortical scarring.  Sub centimeter low attenuation lesions in the right kidney, as before. Kidneys, spleen, pancreas, stomach and proximal colon are otherwise unremarkable.  Mild wall thickening and subtle pericolonic inflammatory haziness are seen in the sigmoid colon.  No extraluminal air or organized  fluid collection.  Hysterectomy.  Atherosclerotic calcification of the arterial vasculature without abdominal aortic aneurysm.  No pathologically enlarged lymph nodes.  No free fluid.  Probable tiny fat-containing periumbilical hernia.  No worrisome lytic or sclerotic lesions.  Postoperative and degenerative changes are seen in the spine.  IMPRESSION:  1.  Mild wall thickening and subtle pericolonic haziness about the sigmoid colon, highly suspicious for diverticulitis.  No extraluminal air or abscess. 2.  Small right renal stone without obstruction. 3.  Borderline hepatomegaly.   Original Report Authenticated By: Lorin Picket, M.D.    Assessment/Plan Principal Problem:   Acute diverticulitis Active Problems:   Diabetes mellitus   Hypertension   Obstructive sleep apnea on CPAP   History of TIAs   H/O vertigo   Hyperlipidemia   1. Acute diverticulitis - Will continue Cipro and Flagyl - Pain control/supportive care - clear liquid diet  2. DM - Will decrease home Lantus dose to less than half from 18 to 8 units sq daily - monitor blood sugars  3. H/o TIA - continue statin and ASA  4. HPL - stable on statin and zetia. Will continue  5.  HTN - Will continue home regimen - hold ARB due to elevated creatinine.   Code Status: full Family Communication: Discussed with patient. No family at bedside. Disposition Plan: Pending improvement in pain control  Time spent: > 60 minutes  Velvet Bathe [facility] Pager [phone]  If 7PM-7AM, please contact night-coverage [email or web address] Password [facility] 03/04/2013, 1:13 PM

## 2013-03-04 NOTE — Progress Notes (Signed)
Noted cm consult Pt lives with a companion.  Patient has an aide from [place]- Friday from 7:30 -9:30 am per 01/30/13 CM note

## 2013-03-04 NOTE — Progress Notes (Signed)
UR Completed.  

## 2013-03-04 NOTE — Progress Notes (Signed)
ANTIBIOTIC CONSULT NOTE - INITIAL  Pharmacy Consult for Ciprofloxacin Indication: Diverticulitis  Allergies  Allergen Reactions  . Ace Inhibitors Other (See Comments)    Angioedema   . Diovan (Valsartan)     Patient Measurements:   Adjusted Body Weight:   Vital Signs: Temp: 98.7 F (37.1 C) (06/25 1032) Temp src: Oral (06/25 1032) BP: 130/51 mmHg (06/25 1032) Intake/Output from previous day:   Intake/Output from this shift:    Labs:  Recent Labs  03/04/13 0830 03/04/13 0915  WBC 6.6  --   HGB 10.2*  --   PLT 173  --   CREATININE  --  1.16*   The CrCl is unknown because both a height and weight (above a minimum accepted value) are required for this calculation. No results found for this basename: VANCOTROUGH, VANCOPEAK, VANCORANDOM, GENTTROUGH, GENTPEAK, GENTRANDOM, TOBRATROUGH, TOBRAPEAK, TOBRARND, AMIKACINPEAK, AMIKACINTROU, AMIKACIN,  in the last 72 hours   Microbiology: No results found for this or any previous visit (from the past 720 hour(s)).  Medical History: Past Medical History  Diagnosis Date  . Diabetes mellitus   . Hypertension   . Sleep apnea     cpap  . TIA (transient ischemic attack)   . Heart murmur   . Stroke     3 x tias  . Blood transfusion   . Arthritis   . Anxiety   . Depression   . Edema     Assessment: Maria [name] admitted with abdominal pain, found to have diverticulitis.   Pt has been started on flagyl and pharmacy consulted to dose ciprofloxacin.  Pt has already received one dose of ciprofloxacin 400mg   6/25 @ 1249.  No allergies to antibiotics documented.  Wt 90.7kg, Ht 62" (5/16), SCr 1.16, estimated CrCl ~37 ml/min (using ABW).  Afebrile.  WBC WNL.    Plan:  1.  Ciprofloxacin 400mg  IV q 12 hours 2.  F/u renal function, fever, WBC, clinical course  Ralene Bathe, PharmD 03/04/2013 3:02 PM  Pager: [phone]

## 2013-03-05 LAB — GLUCOSE, CAPILLARY
Glucose-Capillary: 127 mg/dL — ABNORMAL HIGH (ref 70–99)
Glucose-Capillary: 157 mg/dL — ABNORMAL HIGH (ref 70–99)
Glucose-Capillary: 89 mg/dL (ref 70–99)
Glucose-Capillary: 115 mg/dL — ABNORMAL HIGH (ref 70–99)
Glucose-Capillary: 193 mg/dL — ABNORMAL HIGH (ref 70–99)

## 2013-03-05 LAB — BASIC METABOLIC PANEL
BUN: 20 mg/dL (ref 6–23)
CO2: 31 mEq/L (ref 19–32)
Calcium: 8.7 mg/dL (ref 8.4–10.5)
Chloride: 95 mEq/L — ABNORMAL LOW (ref 96–112)
Creatinine, Ser: 1.11 mg/dL — ABNORMAL HIGH (ref 0.50–1.10)
GFR calc Af Amer: 51 mL/min — ABNORMAL LOW (ref >90)
GFR calc non Af Amer: 44 mL/min — ABNORMAL LOW (ref >90)
Glucose, Bld: 194 mg/dL — ABNORMAL HIGH (ref 70–99)
Potassium: 4.3 mEq/L (ref 3.5–5.1)
Sodium: 134 mEq/L — ABNORMAL LOW (ref 135–145)

## 2013-03-05 LAB — CBC
HCT: 28.9 % — ABNORMAL LOW (ref 36.0–46.0)
Hemoglobin: 9.3 g/dL — ABNORMAL LOW (ref 12.0–15.0)
MCH: 27.8 pg (ref 26.0–34.0)
MCHC: 32.2 g/dL (ref 30.0–36.0)
MCV: 86.5 fL (ref 78.0–100.0)
Platelets: 149 10*3/uL — ABNORMAL LOW (ref 150–400)
RBC: 3.34 MIL/uL — ABNORMAL LOW (ref 3.87–5.11)
RDW: 15 % (ref 11.5–15.5)
WBC: 7.6 10*3/uL (ref 4.0–10.5)

## 2013-03-05 MED ORDER — METRONIDAZOLE 500 MG PO TABS
500.0000 mg | ORAL_TABLET | Freq: Three times a day (TID) | ORAL | Status: DC
  Administered 2013-03-05 – 2013-03-06 (×4): 500 mg via ORAL
  Filled 2013-03-05 (×6): qty 1

## 2013-03-05 MED ORDER — CITALOPRAM HYDROBROMIDE 20 MG PO TABS
20.0000 mg | ORAL_TABLET | Freq: Every day | ORAL | Status: DC
  Administered 2013-03-05 – 2013-03-06 (×2): 20 mg via ORAL
  Filled 2013-03-05 (×2): qty 1

## 2013-03-05 MED ORDER — OXYCODONE HCL ER 15 MG PO T12A
15.0000 mg | EXTENDED_RELEASE_TABLET | Freq: Two times a day (BID) | ORAL | Status: DC
  Administered 2013-03-05: 15 mg via ORAL
  Filled 2013-03-05: qty 1

## 2013-03-05 MED ORDER — CIPROFLOXACIN HCL 500 MG PO TABS
500.0000 mg | ORAL_TABLET | Freq: Two times a day (BID) | ORAL | Status: DC
  Administered 2013-03-05 – 2013-03-06 (×3): 500 mg via ORAL
  Filled 2013-03-05 (×5): qty 1

## 2013-03-05 NOTE — Progress Notes (Signed)
Patient evaluated for long-term disease management services with [employer] [facility] as a benefit of her [employer]. Explained services and consents were signed. Patient will receive a post discharge transition of care call and will be evaluated for monthly home visits for assessments and for education.      Vick Frees, [facility], [phone]

## 2013-03-05 NOTE — Progress Notes (Signed)
[facility] PROGRESS NOTE  Maria [name] [id number] DOB: 07-14-1926 DOA: 03/04/2013 PCP: Maximino Greenland, MD  Assessment/Plan: 1. Acute diverticulitis - Will change Cipro and Flagyl to oral regimen today 03/05/13 - Pain control/supportive care  - Tolerating clears.  Will advance diet as tolerated to full and if tolerated to diabetic diet. Will transition to oral pain drug regimen.  2. DM  - Lantus dose at 8 units SQ daily - monitor blood sugars  - Diabetic diet once able to advance diet.  3. H/o TIA  - continue statin and ASA   4. HPL  - stable on statin and zetia. Will continue   5. HTN  - Will continue home regimen  - hold ARB due to elevated creatinine.   Code Status: full Family Communication: No family at bedside Disposition Plan: Pending improvement in condition   Consultants:  none  Procedures:  none  Antibiotics:  cipro and flagyl   HPI/Subjective: Patient states she is feeling better currently. No new complaints. No acute issues reported overnight.  Objective: Filed Vitals:   03/04/13 1520 03/04/13 1830 03/04/13 2139 03/05/13 0529  BP: 125/74  90/53 104/61  Pulse: 59  59 63  Temp: 98.4 F (36.9 C)  97.4 F (36.3 C) 98.7 F (37.1 C)  TempSrc: Oral  Oral Oral  Resp: 16  18 18   Height:  5\' 4"  (1.626 m)    Weight:  83.915 kg (185 lb)    SpO2: 94%  99% 90%    Intake/Output Summary (Last 24 hours) at 03/05/13 1348 Last data filed at 03/05/13 0620  Gross per 24 hour  Intake   1010 ml  Output    750 ml  Net    260 ml   Filed Weights   03/04/13 1830  Weight: 83.915 kg (185 lb)    Exam:   General:  Pt in NAD, Alert and AWake  Cardiovascular: RRR, no MRG  Respiratory: CTA BL, no wheezes  Abdomen: soft, LLQ tenderness on deep palpation  Musculoskeletal: no cyanosis or clubbing   Data Reviewed: Basic Metabolic Panel:  Recent Labs Lab 03/04/13 0915 03/04/13 1525 03/05/13 0513  NA 138  --  134*  K 3.9  --  4.3  CL  98  --  95*  CO2 30  --  31  GLUCOSE 137*  --  194*  BUN 25*  --  20  CREATININE 1.16* 1.13* 1.11*  CALCIUM 9.5  --  8.7   Liver Function Tests: No results found for this basename: AST, ALT, ALKPHOS, BILITOT, PROT, ALBUMIN,  in the last 168 hours No results found for this basename: LIPASE, AMYLASE,  in the last 168 hours No results found for this basename: AMMONIA,  in the last 168 hours CBC:  Recent Labs Lab 03/04/13 0830 03/04/13 1525 03/05/13 0513  WBC 6.6 6.4 7.6  NEUTROABS 4.4  --   --   HGB 10.2* 9.8* 9.3*  HCT 32.2* 30.1* 28.9*  MCV 87.5 86.7 86.5  PLT 173 165 149*   Cardiac Enzymes: No results found for this basename: CKTOTAL, CKMB, CKMBINDEX, TROPONINI,  in the last 168 hours BNP (last 3 results) No results found for this basename: PROBNP,  in the last 8760 hours CBG:  Recent Labs Lab 03/04/13 1518 03/04/13 1815 03/04/13 2136 03/05/13 0734 03/05/13 1126  GLUCAP 112* 209* 127* 157* 89    No results found for this or any previous visit (from the past 240 hour(s)).   Studies: Ct Abdomen Pelvis W  Contrast  03/04/2013   *RADIOLOGY REPORT*  Clinical Data: Left lower quadrant pain.  Pelvic hematoma.  CT ABDOMEN AND PELVIS WITH CONTRAST  Technique:  Multidetector CT imaging of the abdomen and pelvis was performed following the standard protocol during bolus administration of intravenous contrast.  Contrast: 82mL OMNIPAQUE IOHEXOL 300 MG/ML  SOLN, 163mL OMNIPAQUE IOHEXOL 300 MG/ML  SOLN  Comparison: 01/31/2012.  Findings: Lung bases show no acute findings.  Heart size normal. No pericardial or pleural effusion.  A subtle low attenuation lesion in the inferior right hepatic lobe measures 8 mm, unchanged.  Liver measures approximately 18.5 cm. Liver, gallbladder, and adrenal glands are otherwise unremarkable. Small stone in the lower pole right kidney.  Bilateral renal cortical scarring.  Sub centimeter low attenuation lesions in the right kidney, as before. Kidneys,  spleen, pancreas, stomach and proximal colon are otherwise unremarkable.  Mild wall thickening and subtle pericolonic inflammatory haziness are seen in the sigmoid colon.  No extraluminal air or organized fluid collection.  Hysterectomy.  Atherosclerotic calcification of the arterial vasculature without abdominal aortic aneurysm.  No pathologically enlarged lymph nodes.  No free fluid.  Probable tiny fat-containing periumbilical hernia.  No worrisome lytic or sclerotic lesions.  Postoperative and degenerative changes are seen in the spine.  IMPRESSION:  1.  Mild wall thickening and subtle pericolonic haziness about the sigmoid colon, highly suspicious for diverticulitis.  No extraluminal air or abscess. 2.  Small right renal stone without obstruction. 3.  Borderline hepatomegaly.   Original Report Authenticated By: Lorin Picket, M.D.    Scheduled Meds: . amLODipine  2.5 mg Oral q morning - 10a  . aspirin EC  325 mg Oral QHS  . atorvastatin  40 mg Oral Daily  . ciprofloxacin  500 mg Oral BID  . citalopram  20 mg Oral Daily  . clonazePAM  0.5 mg Oral QHS  . donepezil  10 mg Oral QHS  . ezetimibe  10 mg Oral QHS  . folic acid-pyridoxine-cyancobalamin  1 tablet Oral Daily  . heparin  5,000 Units Subcutaneous Q8H  . hydrALAZINE  25 mg Oral TID  . insulin aspart  0-15 Units Subcutaneous TID WC  . insulin glargine  8 Units Subcutaneous QHS  . isosorbide mononitrate  30 mg Oral Daily  . [START ON 03/06/2013] metolazone  2.5 mg Oral QODAY  . metroNIDAZOLE  500 mg Oral Q8H  . OxyCODONE  15 mg Oral Q12H  . sodium chloride  3 mL Intravenous Q12H   Continuous Infusions:   Principal Problem:   Acute diverticulitis Active Problems:   Diabetes mellitus   Hypertension   Obstructive sleep apnea on CPAP   History of TIAs   H/O vertigo   Hyperlipidemia    Time spent: > 35 minutes    Velvet Bathe  [facility] Pager [phone] If 7PM-7AM, please contact night-coverage at [email or web address],  password [facility] 03/05/2013, 1:48 PM  LOS: 1 day

## 2013-03-06 LAB — GLUCOSE, CAPILLARY
Glucose-Capillary: 114 mg/dL — ABNORMAL HIGH (ref 70–99)
Glucose-Capillary: 127 mg/dL — ABNORMAL HIGH (ref 70–99)

## 2013-03-06 MED ORDER — INSULIN GLARGINE 100 UNIT/ML ~~LOC~~ SOLN: 8.0000 [IU] | Freq: Every day | SUBCUTANEOUS | Status: DC

## 2013-03-06 MED ORDER — CIPROFLOXACIN HCL 500 MG PO TABS: 500.0000 mg | ORAL_TABLET | Freq: Two times a day (BID) | ORAL | Status: DC

## 2013-03-06 MED ORDER — ONDANSETRON HCL 4 MG PO TABS: 4.0000 mg | ORAL_TABLET | Freq: Four times a day (QID) | ORAL | Status: DC | PRN

## 2013-03-06 MED ORDER — METRONIDAZOLE 500 MG PO TABS: 500.0000 mg | ORAL_TABLET | Freq: Three times a day (TID) | ORAL | Status: DC

## 2013-03-06 MED ORDER — OXYCODONE HCL 5 MG PO TABS: 5.0000 mg | ORAL_TABLET | ORAL | Status: DC | PRN

## 2013-03-06 MED ORDER — VITAMINS A & D EX OINT
TOPICAL_OINTMENT | CUTANEOUS | Status: AC
  Administered 2013-03-06: 11:00:00
  Filled 2013-03-06: qty 5

## 2013-03-06 NOTE — Progress Notes (Signed)
Home Health PT/OT services set up with [place] [facility].   Allene Dillon RN BSN   [phone]

## 2013-03-06 NOTE — Evaluation (Signed)
Physical Therapy Evaluation Patient Details Name: Maria [name] MRN: [id number] DOB: 1925/10/02 Today's Date: 03/06/2013 Time: [id number] PT Time Calculation (min): 10 min  PT Assessment / Plan / Recommendation History of Present Illness  79 y.o. female admitted to [facility] due to worsening abdominal pain.  She was dx with diverticulitis.    Clinical Impression  Pt generally weak and deconditioned.  Shaky knees with limited gait distance and speed putting her at high risk for falls.     PT Assessment  All further PT needs can be met in the next venue of care    Follow Up Recommendations  Home health PT    Does the patient have the potential to tolerate intense rehabilitation     NA  Barriers to Discharge Decreased caregiver support Husband is in a WC and doesn't walk.      Equipment Recommendations  None recommended by PT    Recommendations for Other Services   none     Precautions / Restrictions Precautions Precautions: Fall Precaution Comments: slow gait speed and needs RW   Pertinent Vitals/Pain See vitals flow sheet.       Mobility  Transfers Sit to Stand: 4: Min guard Stand to Sit: 4: Min guard Details for Transfer Assistance: verbal cues for safe use of RW, min guard assist for balance during transition of hands from armrests to RW.   Ambulation/Gait Ambulation/Gait Assistance: 4: Min assist Ambulation Distance (Feet): 75 Feet Assistive device: Rolling walker Ambulation/Gait Assistance Details: Min assist to support pt over soft knees.  Pt is very deconditioned and leg strength is poor.   Gait Pattern: Step-through pattern;Shuffle Gait velocity: less than 1.8 ft/sec indicating risk for recurrent falls.         PT Diagnosis: Difficulty walking;Abnormality of gait;Generalized weakness  PT Problem List: Decreased strength;Decreased activity tolerance;Decreased balance;Decreased mobility;Impaired sensation    PT Goals(Current goals can be found in the care plan  section) Acute Rehab PT Goals Patient Stated Goal: to get stronger, but not over exert (she was going to a seniors group exercise class and it was making her SOB)  Visit Information  Last PT Received On: 03/06/13 Assistance Needed: +1 History of Present Illness: 79 y.o. female admitted to [facility] due to worsening abdominal pain.  She was dx with diverticulitis.         Prior [place] expects to be discharged to:: Private residence Living Arrangements: Spouse/significant other Available Help at Discharge: Personal care attendant (7:30-9:30 am M-F) Type of Home: Apartment Home Access: Ramped entrance Home Layout: One level Home Equipment: Walker - 2 wheels Additional Comments: pt's husband is WC bound "we take care of each other" per pt report.   Prior Function Level of Independence: Independent with assistive device(s) Communication Communication: No difficulties    Cognition  Cognition Arousal/Alertness: Awake/alert Behavior During Therapy: WFL for tasks assessed/performed Overall Cognitive Status: Within Functional Limits for tasks assessed (not specifically tested)    Extremity/Trunk Assessment Upper Extremity Assessment Upper Extremity Assessment: Defer to OT evaluation Lower Extremity Assessment Lower Extremity Assessment: RLE deficits/detail;LLE deficits/detail RLE Deficits / Details: right leg limited by right knee OA, audible and palpable crepitus with MMT.  Strength 4/5 throughout LLE Deficits / Details: left leg limited by peripherial neuropathy in that leg.  Strength 4-/5 throughout.   LLE Sensation: history of peripheral neuropathy      End of Session PT - End of Session Activity Tolerance: Patient limited by fatigue Patient left: in chair;with call bell/phone  within reach Nurse Communication: Mobility status;Other (comment) (need for HHPT/OT)    Wells Guiles B. [place], [place], DPT [phone]   03/06/2013, 3:06 PM

## 2013-03-06 NOTE — Progress Notes (Signed)
Clinical Social Work Department BRIEF PSYCHOSOCIAL ASSESSMENT 03/06/2013  Patient:  Maria [name], Maria [name]     Account Number:  [id number]     Admit date:  03/04/2013  Clinical Social Worker:  Earlie Server  Date/Time:  03/06/2013 01:30 PM  Referred by:  Physician  Date Referred:  03/06/2013 Referred for  Transportation assistance   Other Referral:   Interview type:  Patient Other interview type:    PSYCHOSOCIAL DATA Living Status:  FAMILY Admitted from facility:   Level of care:   Primary support name:  Frederico Hamman Primary support relationship to patient:  FRIEND Degree of support available:   Strong    CURRENT CONCERNS Current Concerns  Other - See comment   Other Concerns:   Transportation home    [place] / PLAN CSW received referral to assist with transportation home. CSW reviewed chart and met with patient at bedside.    CSW introduced myself and explained role. Patient reports that she needs ambulance to transport her home. CSW explained that CSW would have to contact insurance for approval but that it was initially denied and was being reviewed by MD at insurance. CSW explained if insurance denied then patient would be responsible for bill. Patient understanding of news and still desires [facility] to take her home.    CSW confirmed address and patient reports that significant other is at home to let her in. CSW completed paperwork and placed on chart for [facility]. RN agreeable to plan as well. Service Request #: [phone]. CSW is signing off but available if needed.   Assessment/plan status:  No Further Intervention Required Other assessment/ plan:   Information/referral to community resources:   [facility] referral    PATIENT'S/FAMILY'S RESPONSE TO PLAN OF CARE: Patient alert and oriented. Patient agreeable to plan and thanked CSW for assistance.

## 2013-03-06 NOTE — Evaluation (Signed)
Occupational Therapy Evaluation Patient Details Name: Maria [name] MRN: [id number] DOB: 1926-04-09 Today's Date: 03/06/2013 Time: [id number]    OT Assessment / Plan / Recommendation     Clinical Impression   Pt presents to OT with decreased I with ADL activity. Pt will benefit from skilled OT to increase I with ADL activity and return to PLOF    OT Assessment  Patient needs continued OT Services    Follow Up Recommendations  Home health OT       Equipment Recommendations  None recommended by OT       Frequency  Min 2X/week    Precautions / Restrictions Restrictions Weight Bearing Restrictions: No       ADL  Grooming: Performed;Wash/dry hands;Wash/dry face;Min guard Where Assessed - Grooming: Unsupported standing Upper Body Bathing: Simulated;Minimal assistance Where Assessed - Upper Body Bathing: Unsupported sitting Lower Body Bathing: Performed;Minimal assistance Where Assessed - Lower Body Bathing: Unsupported sit to stand Upper Body Dressing: Simulated;Minimal assistance Where Assessed - Upper Body Dressing: Unsupported sitting Lower Body Dressing: Performed;Minimal assistance Where Assessed - Lower Body Dressing: Unsupported sit to stand Toilet Transfer: Simulated;Minimal assistance Toilet Transfer Method: Sit to stand Toilet Transfer Equipment: Comfort height toilet Toileting - Clothing Manipulation and Hygiene: Performed;Minimal assistance Where Assessed - Camera operator Manipulation and Hygiene: Standing    OT Diagnosis: Generalized weakness  OT Treatment Interventions: Self-care/ADL training;Patient/family education   OT Goals(Current goals can be found in the care plan section) Acute Rehab OT Goals OT Goal Formulation: With patient Time For Goal Achievement: 03/20/13 ADL Goals Pt Will Perform Grooming: with modified independence Pt Will Perform Upper Body Bathing: with modified independence Pt Will Perform Lower Body Bathing: with modified  independence Pt Will Perform Upper Body Dressing: with modified independence Pt Will Perform Lower Body Dressing: with modified independence Pt Will Transfer to Toilet: with modified independence Pt Will Perform Toileting - Clothing Manipulation and hygiene: with modified independence Pt Will Perform Tub/Shower Transfer: Independently  Visit Information  Last OT Received On: 03/06/13       Prior Functioning     Home Living Family/patient expects to be discharged to:: Private residence Living Arrangements: Spouse/significant other (he is in a wheel chair) Available Help at Discharge: Personal care attendant Type of Home: Apartment Home Access: Ramped entrance Home Layout: One level Communication Communication: No difficulties         Vision/Perception Vision - History Baseline Vision: Wears glasses all the time   Cognition  Cognition Arousal/Alertness: Awake/alert Behavior During Therapy: WFL for tasks assessed/performed Overall Cognitive Status: Within Functional Limits for tasks assessed    Extremity/Trunk Assessment Upper Extremity Assessment Upper Extremity Assessment: Overall WFL for tasks assessed Lower Extremity Assessment Lower Extremity Assessment: Overall WFL for tasks assessed     Mobility Bed Mobility Bed Mobility: Not assessed Transfers Transfers: Sit to Stand;Stand to Sit Sit to Stand: 4: Min assist;From chair/3-in-1;With armrests Stand to Sit: 4: Min assist;To chair/3-in-1;With armrests Details for Transfer Assistance: verbal cues for hand placement        Balance Balance Balance Assessed: Yes Static Sitting Balance Static Sitting - Balance Support: Feet supported Static Sitting - Level of Assistance: 7: Independent Static Standing Balance Static Standing - Balance Support: During functional activity Static Standing - Level of Assistance: 5: Stand by assistance   End of Session OT - End of Session Activity Tolerance: Patient tolerated  treatment well Patient left: in chair;with call bell/phone within reach  [place], Thereasa Parkin 03/06/2013, 10:32  AM

## 2013-03-06 NOTE — Discharge Summary (Signed)
Physician Discharge Summary  Maria [name] [id number] DOB: 24-Aug-1926 DOA: 03/04/2013  PCP: Maximino Greenland, MD  Admit date: 03/04/2013 Discharge date: 03/06/2013  Time spent: > 35 minutes  Recommendations for Outpatient Follow-up:  1. Please be sure to follow up with your primary care physician in 1-2 weeks or sooner should any new concerns arise. 2. Recommend and continue to enforce a high fiber diet.  3. Decide whether or not patient will require a more prolonged antibiotic course.  Discharge Diagnoses:  Principal Problem:   Acute diverticulitis Active Problems:   Diabetes mellitus   Hypertension   Obstructive sleep apnea on CPAP   History of TIAs   H/O vertigo   Hyperlipidemia   Discharge Condition: Stable  Diet recommendation: High fiber diet  Filed Weights   03/04/13 1830  Weight: 83.915 kg (185 lb)    History of present illness:  80 y/o AAF with history of DM, HTN, depression, that presented to the ED complaining of Abdominal pain.  Hospital Course:   1. Acute diverticulitis - Will change Cipro and Flagyl to oral regimen today 03/05/13  - Tolerating diet and pain controlled well on oral regimen. - Recommended a high fiber diet  2. DM  - Lantus dose at 8 units SQ daily.  Patient to continue monitoring blood sugars at home and is to follow up with your primary care physician.  - Diabetic diet at home  3. H/o TIA  - continue statin and ASA   4. HPL  - stable on statin and zetia. Will continue   5. HTN  - Will continue home regimen  - hold ARB due to elevated creatinine on discharge.  - blood pressures relatively well controlled off the arb   Consultations:  none  Discharge Exam: Filed Vitals:   03/05/13 0529 03/05/13 1424 03/05/13 2115 03/06/13 0527  BP: 104/61 118/68 147/65 123/64  Pulse: 63 53 74 66  Temp: 98.7 F (37.1 C) 97.7 F (36.5 C) 98.3 F (36.8 C) 98.7 F (37.1 C)  TempSrc: Oral Oral Oral Oral  Resp: 18 18 18 18   Height:       Weight:      SpO2: 90% 95% 90% 97%    General: Pt in NAD, Alert and Awake Cardiovascular: RRR, no MRG Respiratory: CTA BL, no wheezes  Discharge Instructions  Discharge Orders   Future Appointments Provider Department Dept Phone   01/06/2014 8:45 AM Hayden Pedro, MD [facility] [phone]   Future Orders Complete By Expires     Call MD for:  difficulty breathing, headache or visual disturbances  As directed     Call MD for:  redness, tenderness, or signs of infection (pain, swelling, redness, odor or green/yellow discharge around incision site)  As directed     Call MD for:  severe uncontrolled pain  As directed     Call MD for:  temperature >100.4  As directed     Diet - low sodium heart healthy  As directed     Increase activity slowly  As directed         Medication List    STOP taking these medications       telmisartan 80 MG tablet  Commonly known as:  MICARDIS      TAKE these medications       amLODipine 5 MG tablet  Commonly known as:  NORVASC  Take 2.5 mg by mouth every morning.     aspirin 325 MG EC  tablet  Take 325 mg by mouth at bedtime.     atorvastatin 40 MG tablet  Commonly known as:  LIPITOR  Take 40 mg by mouth daily.     ciprofloxacin 500 MG tablet  Commonly known as:  CIPRO  Take 1 tablet (500 mg total) by mouth 2 (two) times daily.     citalopram 40 MG tablet  Commonly known as:  CELEXA  Take 40 mg by mouth daily.     clonazePAM 0.5 MG tablet  Commonly known as:  KLONOPIN  Take 0.5 mg by mouth at bedtime.     docusate sodium 50 MG capsule  Commonly known as:  COLACE  Take by mouth 2 (two) times daily as needed for constipation.     donepezil 10 MG tablet  Commonly known as:  ARICEPT  Take 10 mg by mouth daily.     ezetimibe 10 MG tablet  Commonly known as:  ZETIA  Take 10 mg by mouth at bedtime.     folic acid-pyridoxine-cyancobalamin 2.5-25-2 MG Tabs  Commonly known as:  FOLBIC  Take 1 tablet by  mouth daily.     furosemide 40 MG tablet  Commonly known as:  LASIX  Take 40 mg by mouth 2 (two) times daily.     hydrALAZINE 25 MG tablet  Commonly known as:  APRESOLINE  Take 1 tablet (25 mg total) by mouth 3 (three) times daily.     insulin glargine 100 UNIT/ML injection  Commonly known as:  LANTUS  Inject 0.08 mLs (8 Units total) into the skin at bedtime.     isosorbide mononitrate 30 MG 24 hr tablet  Commonly known as:  IMDUR  Take 30 mg by mouth daily.     loratadine 10 MG tablet  Commonly known as:  CLARITIN  Take 10 mg by mouth every morning.     metolazone 2.5 MG tablet  Commonly known as:  ZAROXOLYN  Take 1 tablet (2.5 mg total) by mouth every other day.     metroNIDAZOLE 500 MG tablet  Commonly known as:  FLAGYL  Take 1 tablet (500 mg total) by mouth every 8 (eight) hours.     ondansetron 4 MG tablet  Commonly known as:  ZOFRAN  Take 1 tablet (4 mg total) by mouth every 6 (six) hours as needed for nausea.     oxyCODONE 5 MG immediate release tablet  Commonly known as:  Oxy IR/ROXICODONE  Take 1 tablet (5 mg total) by mouth every 4 (four) hours as needed.     potassium chloride 10 MEQ tablet  Commonly known as:  K-DUR  Take 10 mEq by mouth 2 (two) times daily.     Vitamin D-3 1000 UNITS Caps  Take 1 capsule by mouth daily.       Allergies  Allergen Reactions  . Ace Inhibitors Other (See Comments)    Angioedema   . Diovan (Valsartan)       The results of significant diagnostics from this hospitalization (including imaging, microbiology, ancillary and laboratory) are listed below for reference.    Significant Diagnostic Studies: Ct Abdomen Pelvis W Contrast  03/04/2013   *RADIOLOGY REPORT*  Clinical Data: Left lower quadrant pain.  Pelvic hematoma.  CT ABDOMEN AND PELVIS WITH CONTRAST  Technique:  Multidetector CT imaging of the abdomen and pelvis was performed following the standard protocol during bolus administration of intravenous contrast.   Contrast: 67mL OMNIPAQUE IOHEXOL 300 MG/ML  SOLN, 136mL OMNIPAQUE IOHEXOL 300 MG/ML  SOLN  Comparison: 01/31/2012.  Findings: Lung bases show no acute findings.  Heart size normal. No pericardial or pleural effusion.  A subtle low attenuation lesion in the inferior right hepatic lobe measures 8 mm, unchanged.  Liver measures approximately 18.5 cm. Liver, gallbladder, and adrenal glands are otherwise unremarkable. Small stone in the lower pole right kidney.  Bilateral renal cortical scarring.  Sub centimeter low attenuation lesions in the right kidney, as before. Kidneys, spleen, pancreas, stomach and proximal colon are otherwise unremarkable.  Mild wall thickening and subtle pericolonic inflammatory haziness are seen in the sigmoid colon.  No extraluminal air or organized fluid collection.  Hysterectomy.  Atherosclerotic calcification of the arterial vasculature without abdominal aortic aneurysm.  No pathologically enlarged lymph nodes.  No free fluid.  Probable tiny fat-containing periumbilical hernia.  No worrisome lytic or sclerotic lesions.  Postoperative and degenerative changes are seen in the spine.  IMPRESSION:  1.  Mild wall thickening and subtle pericolonic haziness about the sigmoid colon, highly suspicious for diverticulitis.  No extraluminal air or abscess. 2.  Small right renal stone without obstruction. 3.  Borderline hepatomegaly.   Original Report Authenticated By: Lorin Picket, M.D.    Microbiology: No results found for this or any previous visit (from the past 240 hour(s)).   Labs: Basic Metabolic Panel:  Recent Labs Lab 03/04/13 0915 03/04/13 1525 03/05/13 0513  NA 138  --  134*  K 3.9  --  4.3  CL 98  --  95*  CO2 30  --  31  GLUCOSE 137*  --  194*  BUN 25*  --  20  CREATININE 1.16* 1.13* 1.11*  CALCIUM 9.5  --  8.7   Liver Function Tests: No results found for this basename: AST, ALT, ALKPHOS, BILITOT, PROT, ALBUMIN,  in the last 168 hours No results found for this  basename: LIPASE, AMYLASE,  in the last 168 hours No results found for this basename: AMMONIA,  in the last 168 hours CBC:  Recent Labs Lab 03/04/13 0830 03/04/13 1525 03/05/13 0513  WBC 6.6 6.4 7.6  NEUTROABS 4.4  --   --   HGB 10.2* 9.8* 9.3*  HCT 32.2* 30.1* 28.9*  MCV 87.5 86.7 86.5  PLT 173 165 149*   Cardiac Enzymes: No results found for this basename: CKTOTAL, CKMB, CKMBINDEX, TROPONINI,  in the last 168 hours BNP: BNP (last 3 results) No results found for this basename: PROBNP,  in the last 8760 hours CBG:  Recent Labs Lab 03/05/13 1126 03/05/13 1716 03/05/13 2114 03/06/13 0734 03/06/13 1218  GLUCAP 89 115* 193* 114* 127*       Signed:  Velvet Bathe  [facility] 03/06/2013, 12:36 PM

## 2013-04-15 ENCOUNTER — Other Ambulatory Visit: Payer: Self-pay

## 2013-04-20 ENCOUNTER — Telehealth: Payer: Self-pay | Admitting: Cardiovascular Disease

## 2013-04-20 NOTE — Telephone Encounter (Signed)
Maria [name] reported a heart rate of 55 bpm with a bp of 126/60. Maria [name] has no new complaints.  Maria [name] was concerned that the heart rate was <60.  I pulled paper chart and reviewed with Cecilie Kicks NP.  Pt asymptomatic. Not on a beta blocker.  Continue current medication and treament plan.

## 2013-05-01 ENCOUNTER — Telehealth: Payer: Self-pay | Admitting: Cardiovascular Disease

## 2013-05-01 DIAGNOSIS — Z79899 Other long term (current) drug therapy: Secondary | ICD-10-CM

## 2013-05-01 NOTE — Telephone Encounter (Signed)
Message forwarded to L. Dorene Ar, NP for further instructions.

## 2013-05-01 NOTE — Telephone Encounter (Signed)
Returned call and informed pt per instructions by MD/PA.  Pt verbalized understanding and agreed w/ plan.  Lab ordered and [name] will check w/ PCP to find out if pt had that lab recently.  Will call back on Monday to update.

## 2013-05-01 NOTE — Telephone Encounter (Signed)
Please call -question about her medicine.

## 2013-05-01 NOTE — Telephone Encounter (Signed)
Returned call and spoke w/ [name], pt's granddaughter.  Stated pt received telmisartan from the mail order pharmacy and she wants to know if pt is supposed to be taking it.  Informed telmisartan is the generic for Micardis and was stopped at discharge when pt was in the hospital in June.  [name] stated pt has been taking it the whole time.  Denied pt has has any complaints except last week when the nurse came and her HR was 55.  [name] informed RN saw note and NP advised pt continue current regimen, but was not aware pt was on Micardis as it is not listed as a current med.  Informed Dr. Gwenlyn Found is out of the office and RN will try to talk with one of the other providers for advice on how to proceed.  Verbalized understanding.

## 2013-05-01 NOTE — Telephone Encounter (Signed)
Keep taking the micardis if she is taking now, but get BMP on Monday to make sure kidneys are OK.

## 2013-05-04 ENCOUNTER — Telehealth: Payer: Self-pay | Admitting: Cardiovascular Disease

## 2013-05-04 NOTE — Telephone Encounter (Signed)
No results on clinical fax.  Will notify Medical Records to look out for labs.

## 2013-05-04 NOTE — Telephone Encounter (Signed)
I would like the labs repeated, if the kidney function has increased I would stop the micardis.

## 2013-05-04 NOTE — Telephone Encounter (Signed)
Returned call.  Left generic message that results not received and will be on the lookout for results.  Also that will call when received.  Call back before 4 pm if questions.

## 2013-05-04 NOTE — Telephone Encounter (Signed)
Wants to know if you received her lab work from [facility]? Please call and let her know.

## 2013-05-04 NOTE — Telephone Encounter (Signed)
Results received and left for L. Dorene Ar, NP to review and advise if pt needs to have labs done still.  Will await instructions and call back.

## 2013-05-05 NOTE — Telephone Encounter (Signed)
Returned call and informed [name], labs received and advice per L. Dorene Ar, NP.  Verbalized understanding and stated pt will go tomorrow morning.

## 2013-05-06 LAB — BASIC METABOLIC PANEL
BUN: 26 mg/dL — ABNORMAL HIGH (ref 6–23)
CO2: 30 mEq/L (ref 19–32)
Calcium: 8.9 mg/dL (ref 8.4–10.5)
Chloride: 101 mEq/L (ref 96–112)
Creat: 1.16 mg/dL — ABNORMAL HIGH (ref 0.50–1.10)
Glucose, Bld: 156 mg/dL — ABNORMAL HIGH (ref 70–99)
Potassium: 3.6 mEq/L (ref 3.5–5.3)
Sodium: 139 mEq/L (ref 135–145)

## 2013-05-07 ENCOUNTER — Other Ambulatory Visit: Payer: Self-pay | Admitting: *Deleted

## 2013-05-07 ENCOUNTER — Telehealth: Payer: Self-pay | Admitting: *Deleted

## 2013-05-07 MED ORDER — ATORVASTATIN CALCIUM 40 MG PO TABS: 40.0000 mg | ORAL_TABLET | Freq: Every day | ORAL | Status: DC

## 2013-05-07 NOTE — Telephone Encounter (Signed)
Rx was sent to pharmacy electronically. 

## 2013-05-07 NOTE — Telephone Encounter (Signed)
Message copied by Roselie Awkward on Thu May 07, 2013  9:28 AM ------      Message from: Maria [name]      Created: Thu May 07, 2013  8:10 AM       Ok continue micardis.  Labs stable. ------

## 2013-05-07 NOTE — Telephone Encounter (Signed)
Call to [facility] and results given per MD.  Verbalized understanding.  Denied refill needed.

## 2013-05-25 ENCOUNTER — Telehealth: Payer: Self-pay | Admitting: Cardiovascular Disease

## 2013-05-25 NOTE — Telephone Encounter (Signed)
[name] in calling about Ms. Conde medication. There was a change that was made by Dr. Louann Sjogren at [facility]. The medication was Micardis.

## 2013-05-25 NOTE — Telephone Encounter (Signed)
Returned call to [place], pt's granddaughter.  Stated Dr. Louann Sjogren told pt to cut Micardis in half to 40 mg instead of 80 mg.  Stated pt's BP was low at the appt and that's why it was changed.  Also stated they were told to call Dr. Kennon Holter office to let him know.  Granddaughter stated BP has been running 100s/60s since Friday and they did not tell her what the low BP was on Friday at the appt.  Advised she continue to monitor BP and notify office if BP is outside range of 100-129/60-89.  Also advised to check HR w/ BP check.  Verbalized understanding and agreed w/ plan.  Message forwarded to Dr. Gwenlyn Found for review.  Med list updated.

## 2013-06-23 ENCOUNTER — Telehealth: Payer: Self-pay | Admitting: Cardiovascular Disease

## 2013-06-23 MED ORDER — POTASSIUM CHLORIDE ER 10 MEQ PO TBCR: 10.0000 meq | EXTENDED_RELEASE_TABLET | Freq: Two times a day (BID) | ORAL | Status: DC

## 2013-06-23 NOTE — Telephone Encounter (Signed)
Returned call and pt informed message received.  Also informed of refill process and that RN will send in Rxs to both pharmacies.  Pt verbalized understanding and agreed w/ plan.  Refill(s) sent to pharmacies [facility] and [facility]).

## 2013-06-23 NOTE — Telephone Encounter (Signed)
Completely out of her Potassium Chloride 10 mg-would you call a week supply into [place] [phone].She just spoke to [facility] and they told her to have you call in her new prescription for it.Phone [phone].

## 2013-07-16 ENCOUNTER — Other Ambulatory Visit: Payer: Self-pay

## 2013-07-20 ENCOUNTER — Other Ambulatory Visit: Payer: Self-pay | Admitting: Neurology

## 2013-07-20 DIAGNOSIS — R0989 Other specified symptoms and signs involving the circulatory and respiratory systems: Secondary | ICD-10-CM

## 2013-07-22 ENCOUNTER — Ambulatory Visit
Admission: RE | Admit: 2013-07-22 | Discharge: 2013-07-22 | Disposition: A | Discharge status: Home / Self Care | Payer: Medicare Other | Source: Ambulatory Visit | Attending: Neurology | Admitting: Neurology

## 2013-07-22 DIAGNOSIS — R0989 Other specified symptoms and signs involving the circulatory and respiratory systems: Secondary | ICD-10-CM

## 2013-07-23 ENCOUNTER — Encounter: Payer: Self-pay | Admitting: Cardiovascular Disease

## 2013-07-23 ENCOUNTER — Ambulatory Visit (INDEPENDENT_AMBULATORY_CARE_PROVIDER_SITE_OTHER): Payer: Medicare Other | Admitting: Cardiovascular Disease

## 2013-07-23 VITALS — BP 144/62 | HR 68 | Ht 62.0 in | Wt 194.0 lb

## 2013-07-23 DIAGNOSIS — E785 Hyperlipidemia, unspecified: Secondary | ICD-10-CM

## 2013-07-23 DIAGNOSIS — I10 Essential (primary) hypertension: Secondary | ICD-10-CM

## 2013-07-23 DIAGNOSIS — R600 Localized edema: Secondary | ICD-10-CM

## 2013-07-23 DIAGNOSIS — Z79899 Other long term (current) drug therapy: Secondary | ICD-10-CM

## 2013-07-23 DIAGNOSIS — R609 Edema, unspecified: Secondary | ICD-10-CM

## 2013-07-23 NOTE — Assessment & Plan Note (Signed)
Her edema is markedly improved as a result of dietary modification with aggressive salt. She is on diuretics and wears compression stockings. Her weight is down 6 pounds since she was here last

## 2013-07-23 NOTE — Patient Instructions (Signed)
Your physician wants you to follow-up in: 6 months with an extender and 1 year with Dr Berry. You will receive a reminder letter in the mail two months in advance. If you don't receive a letter, please call our office to schedule the follow-up appointment.  

## 2013-07-23 NOTE — Progress Notes (Signed)
07/23/2013 Maria [name]   08/21/1926  [id number]  Primary Physician Maximino Greenland, MD Primary Cardiologist: Lorretta Harp MD Renae Gloss   HPI:  The patient is an 79 year old, moderately overweight, widowed Caucasian female, mother of 2, grandmother to 2 grandchildren who I last saw 4 months ago. She is accompanied by one of her caregivers today. Her problems include hypertension, hyperlipidemia, diabetes and obstructive sleep apnea on CPAP. She has normal coronary arteries and normal LV function by cath that I performed February of this year. She does have diastolic dysfunction. When I saw her back in February she was complaining of dyspnea and I did begin her on Zaroxolyn twice a week which resulted in some improvement in her symptoms as well as in her lower extremity edema, though, today she clearly admits to dietary indiscretion with regards to salt. She was admitted to [facility] for 4 days last month because of diverticulitis. Since her last office visit she continues to have dietary indiscretion related to salt with significant lower extremity edema and shortness of breath as well as palpitations. She does complain of occasional episodes of substernal chest pain but she has normal coronary arteries by cath which I performed 11/08/2010.since I saw her 6 months ago she has restricted her salt intake and feels better. She has less edema than she had in the past 6 pounds lighter. She wears compression stockings.    Current Outpatient Prescriptions  Medication Sig Dispense Refill  . amLODipine (NORVASC) 5 MG tablet Take 2.5 mg by mouth every morning.       Marland Kitchen aspirin 325 MG EC tablet Take 325 mg by mouth at bedtime.       Marland Kitchen atorvastatin (LIPITOR) 40 MG tablet Take 1 tablet (40 mg total) by mouth daily.  90 tablet  3  . Cholecalciferol (VITAMIN D-3) 1000 UNITS CAPS Take 1 capsule by mouth daily.      . citalopram (CELEXA) 40 MG tablet Take 40 mg by mouth daily.      .  clonazePAM (KLONOPIN) 0.5 MG tablet Take 0.5 mg by mouth at bedtime.      . docusate sodium (COLACE) 50 MG capsule Take by mouth 2 (two) times daily as needed for constipation.      Marland Kitchen donepezil (ARICEPT) 10 MG tablet Take 10 mg by mouth daily.      Marland Kitchen ezetimibe (ZETIA) 10 MG tablet Take 10 mg by mouth at bedtime.      . folic acid-pyridoxine-cyancobalamin (FOLBIC) 2.5-25-2 MG TABS Take 1 tablet by mouth daily.  90 each  3  . furosemide (LASIX) 40 MG tablet Take 40 mg by mouth 2 (two) times daily.      . hydrALAZINE (APRESOLINE) 25 MG tablet Take 1 tablet (25 mg total) by mouth 3 (three) times daily.  270 tablet  3  . insulin glargine (LANTUS) 100 UNIT/ML injection Inject 15 Units into the skin at bedtime.      . isosorbide mononitrate (IMDUR) 30 MG 24 hr tablet Take 30 mg by mouth daily.      Marland Kitchen loratadine (CLARITIN) 10 MG tablet Take 10 mg by mouth every morning.      . metolazone (ZAROXOLYN) 2.5 MG tablet Take 1 tablet (2.5 mg total) by mouth every other day.  15 tablet  6  . potassium chloride (K-DUR) 10 MEQ tablet Take 1 tablet (10 mEq total) by mouth 2 (two) times daily.  180 tablet  1  . telmisartan (MICARDIS) 40 MG  tablet Take 40 mg by mouth daily.       No current facility-administered medications for this visit.    Allergies  Allergen Reactions  . Ace Inhibitors Other (See Comments)    Angioedema   . Diovan [Valsartan]     History   Social History  . Marital Status: Widowed    Spouse Name: N/A    Number of Children: N/A  . Years of Education: N/A   Occupational History  . Not on file.   Social History Main Topics  . Smoking status: Never Smoker   . Smokeless tobacco: Never Used  . Alcohol Use: No  . Drug Use: No  . Sexual Activity: No   Other Topics Concern  . Not on file   Social History Narrative  . No narrative on file     Review of Systems: General: negative for chills, fever, night sweats or weight changes.  Cardiovascular: negative for chest pain,  dyspnea on exertion, edema, orthopnea, palpitations, paroxysmal nocturnal dyspnea or shortness of breath Dermatological: negative for rash Respiratory: negative for cough or wheezing Urologic: negative for hematuria Abdominal: negative for nausea, vomiting, diarrhea, bright red blood per rectum, melena, or hematemesis Neurologic: negative for visual changes, syncope, or dizziness All other systems reviewed and are otherwise negative except as noted above.    Blood pressure 144/62, pulse 68, height 5\' 2"  (1.575 m), weight 194 lb (87.998 kg).  General appearance: alert and no distress Neck: no adenopathy, no carotid bruit, no JVD, supple, symmetrical, trachea midline and thyroid not enlarged, symmetric, no tenderness/mass/nodules Lungs: clear to auscultation bilaterally Heart: soft outflow tract murmur Extremities: 1+ pitting edema bilaterally. She is wearing compression stockings  EKG normal sinus rhythm at 68 without ST or T wave changes  ASSESSMENT AND PLAN:   Hypertension Well-controlled on current medications  Hyperlipidemia On statin therapy. We will recheck a lipid and liver profile  Bilateral lower extremity edema Her edema is markedly improved as a result of dietary modification with aggressive salt. She is on diuretics and wears compression stockings. Her weight is down 6 pounds since she was here last      Lorretta Harp MD [facility], [facility] 07/23/2013 10:29 AM

## 2013-07-23 NOTE — Assessment & Plan Note (Signed)
Well-controlled on current medications 

## 2013-07-23 NOTE — Assessment & Plan Note (Signed)
On statin therapy. We will recheck a lipid and liver profile 

## 2013-07-24 ENCOUNTER — Ambulatory Visit: Payer: Medicare Other

## 2013-07-27 ENCOUNTER — Other Ambulatory Visit: Payer: Self-pay

## 2013-07-27 DIAGNOSIS — Z1231 Encounter for screening mammogram for malignant neoplasm of breast: Secondary | ICD-10-CM

## 2013-08-13 ENCOUNTER — Other Ambulatory Visit: Payer: Self-pay | Admitting: *Deleted

## 2013-08-13 MED ORDER — METOLAZONE 2.5 MG PO TABS: 2.5000 mg | ORAL_TABLET | ORAL | Status: DC

## 2013-08-13 NOTE — Telephone Encounter (Signed)
Rx was sent to pharmacy electronically. 

## 2013-08-14 ENCOUNTER — Ambulatory Visit (INDEPENDENT_AMBULATORY_CARE_PROVIDER_SITE_OTHER): Payer: Medicare Other

## 2013-08-14 VITALS — BP 142/69 | HR 81 | Resp 18

## 2013-08-14 DIAGNOSIS — B351 Tinea unguium: Secondary | ICD-10-CM

## 2013-08-14 DIAGNOSIS — E1149 Type 2 diabetes mellitus with other diabetic neurological complication: Secondary | ICD-10-CM

## 2013-08-14 DIAGNOSIS — E1140 Type 2 diabetes mellitus with diabetic neuropathy, unspecified: Secondary | ICD-10-CM

## 2013-08-14 DIAGNOSIS — E1142 Type 2 diabetes mellitus with diabetic polyneuropathy: Secondary | ICD-10-CM

## 2013-08-14 DIAGNOSIS — M79609 Pain in unspecified limb: Secondary | ICD-10-CM

## 2013-08-14 NOTE — Progress Notes (Signed)
   Subjective:    Patient ID: Maria [name], female    DOB: 04-30-1926, 79 y.o.   MRN: [id number]  HPI I just need my toenails trimmed    Review of Systems no changes or new findings     Objective:   Physical Exam Vascular status is intact with pedal pulses palpable DP pulse 2 for PT one over 4 bilateral. Refill timed 3-4 seconds all digits. Skin temperature warm turgor diminished. Orthopedic biomechanical exam reveals notable bunion deformity as well as rigid digital contractures hammertoes 2 through 5 second bilateral no severe. There is no keratoses no active ulcerations noted the present time. Neurologically epicritic and proprioceptive sensations diminished on Semmes Weinstein testing to the forefoot and digits. Patient also continues to profound paresthesia secondary to her diabetic neuropathy. Nails thick hypertrophic friable brittle darkened and discolored 1 through 5 bilateral painful with enclosed shoes activities and ambulation.       Assessment & Plan:  Assessment this time his diabetes with history peripheral neuropathy and paresthesia. Patient also has mycotic nails thick friable discolored brittle nails 1 through 5 bilateral debridement at this time and the presence of diabetes and complications. Her diabetic shoes aren't fitting and wearing well. Recheck in 3 months for followup nail care and debridement at  [name]

## 2013-08-14 NOTE — Patient Instructions (Signed)
Diabetes and Foot Care Diabetes may cause you to have problems because of poor blood supply (circulation) to your feet and legs. This may cause the skin on your feet to become thinner, break easier, and heal more slowly. Your skin may become dry, and the skin may peel and crack. You may also have nerve damage in your legs and feet causing decreased feeling in them. You may not notice minor injuries to your feet that could lead to infections or more serious problems. Taking care of your feet is one of the most important things you can do for yourself.  HOME CARE INSTRUCTIONS  Wear shoes at all times, even in the house. Do not go barefoot. Bare feet are easily injured.  Check your feet daily for blisters, cuts, and redness. If you cannot see the bottom of your feet, use a mirror or ask someone for help.  Wash your feet with warm water (do not use hot water) and mild soap. Then pat your feet and the areas between your toes until they are completely dry. Do not soak your feet as this can dry your skin.  Apply a moisturizing lotion or petroleum jelly (that does not contain alcohol and is unscented) to the skin on your feet and to dry, brittle toenails. Do not apply lotion between your toes.  Trim your toenails straight across. Do not dig under them or around the cuticle. File the edges of your nails with an emery board or nail file.  Do not cut corns or calluses or try to remove them with medicine.  Wear clean socks or stockings every day. Make sure they are not too tight. Do not wear knee-high stockings since they may decrease blood flow to your legs.  Wear shoes that fit properly and have enough cushioning. To break in new shoes, wear them for just a few hours a day. This prevents you from injuring your feet. Always look in your shoes before you put them on to be sure there are no objects inside.  Do not cross your legs. This may decrease the blood flow to your feet.  If you find a minor scrape,  cut, or break in the skin on your feet, keep it and the skin around it clean and dry. These areas may be cleansed with mild soap and water. Do not cleanse the area with peroxide, alcohol, or iodine.  When you remove an adhesive bandage, be sure not to damage the skin around it.  If you have a wound, look at it several times a day to make sure it is healing.  Do not use heating pads or hot water bottles. They may burn your skin. If you have lost feeling in your feet or legs, you may not know it is happening until it is too late.  Make sure your health care provider performs a complete foot exam at least annually or more often if you have foot problems. Report any cuts, sores, or bruises to your health care provider immediately. SEEK MEDICAL CARE IF:   You have an injury that is not healing.  You have cuts or breaks in the skin.  You have an ingrown nail.  You notice redness on your legs or feet.  You feel burning or tingling in your legs or feet.  You have pain or cramps in your legs and feet.  Your legs or feet are numb.  Your feet always feel cold. SEEK IMMEDIATE MEDICAL CARE IF:   There is increasing redness,   swelling, or pain in or around a wound.  There is a red line that goes up your leg.  Pus is coming from a wound.  You develop a fever or as directed by your health care provider.  You notice a bad smell coming from an ulcer or wound. Document Released: 08/24/2000 Document Revised: 04/29/2013 Document Reviewed: 02/03/2013 [employer] Patient Information 2014 [employer].  

## 2013-09-08 ENCOUNTER — Ambulatory Visit
Admission: RE | Admit: 2013-09-08 | Discharge: 2013-09-08 | Disposition: A | Discharge status: Home / Self Care | Payer: Medicare Other | Source: Ambulatory Visit

## 2013-09-08 DIAGNOSIS — Z1231 Encounter for screening mammogram for malignant neoplasm of breast: Secondary | ICD-10-CM

## 2013-10-22 ENCOUNTER — Other Ambulatory Visit: Payer: Self-pay | Admitting: *Deleted

## 2013-10-22 MED ORDER — EZETIMIBE 10 MG PO TABS: 10.0000 mg | ORAL_TABLET | Freq: Every day | ORAL | Status: DC

## 2013-10-22 MED ORDER — TELMISARTAN 40 MG PO TABS: 40.0000 mg | ORAL_TABLET | Freq: Every day | ORAL | Status: DC

## 2013-10-22 MED ORDER — ISOSORBIDE MONONITRATE ER 30 MG PO TB24: 30.0000 mg | ORAL_TABLET | Freq: Every day | ORAL | Status: DC

## 2013-10-22 MED ORDER — POTASSIUM CHLORIDE ER 10 MEQ PO TBCR: 10.0000 meq | EXTENDED_RELEASE_TABLET | Freq: Two times a day (BID) | ORAL | Status: DC

## 2013-10-22 MED ORDER — FUROSEMIDE 40 MG PO TABS: 40.0000 mg | ORAL_TABLET | Freq: Two times a day (BID) | ORAL | Status: DC

## 2013-11-13 ENCOUNTER — Ambulatory Visit (INDEPENDENT_AMBULATORY_CARE_PROVIDER_SITE_OTHER): Payer: Medicare Other

## 2013-11-13 VITALS — BP 134/62 | HR 90 | Resp 16

## 2013-11-13 DIAGNOSIS — M79609 Pain in unspecified limb: Secondary | ICD-10-CM

## 2013-11-13 DIAGNOSIS — B351 Tinea unguium: Secondary | ICD-10-CM

## 2013-11-13 DIAGNOSIS — E1140 Type 2 diabetes mellitus with diabetic neuropathy, unspecified: Secondary | ICD-10-CM

## 2013-11-13 NOTE — Patient Instructions (Signed)
Diabetes and Foot Care Diabetes may cause you to have problems because of poor blood supply (circulation) to your feet and legs. This may cause the skin on your feet to become thinner, break easier, and heal more slowly. Your skin may become dry, and the skin may peel and crack. You may also have nerve damage in your legs and feet causing decreased feeling in them. You may not notice minor injuries to your feet that could lead to infections or more serious problems. Taking care of your feet is one of the most important things you can do for yourself.  HOME CARE INSTRUCTIONS  Wear shoes at all times, even in the house. Do not go barefoot. Bare feet are easily injured.  Check your feet daily for blisters, cuts, and redness. If you cannot see the bottom of your feet, use a mirror or ask someone for help.  Wash your feet with warm water (do not use hot water) and mild soap. Then pat your feet and the areas between your toes until they are completely dry. Do not soak your feet as this can dry your skin.  Apply a moisturizing lotion or petroleum jelly (that does not contain alcohol and is unscented) to the skin on your feet and to dry, brittle toenails. Do not apply lotion between your toes.  Trim your toenails straight across. Do not dig under them or around the cuticle. File the edges of your nails with an emery board or nail file.  Do not cut corns or calluses or try to remove them with medicine.  Wear clean socks or stockings every day. Make sure they are not too tight. Do not wear knee-high stockings since they may decrease blood flow to your legs.  Wear shoes that fit properly and have enough cushioning. To break in new shoes, wear them for just a few hours a day. This prevents you from injuring your feet. Always look in your shoes before you put them on to be sure there are no objects inside.  Do not cross your legs. This may decrease the blood flow to your feet.  If you find a minor scrape,  cut, or break in the skin on your feet, keep it and the skin around it clean and dry. These areas may be cleansed with mild soap and water. Do not cleanse the area with peroxide, alcohol, or iodine.  When you remove an adhesive bandage, be sure not to damage the skin around it.  If you have a wound, look at it several times a day to make sure it is healing.  Do not use heating pads or hot water bottles. They may burn your skin. If you have lost feeling in your feet or legs, you may not know it is happening until it is too late.  Make sure your health care provider performs a complete foot exam at least annually or more often if you have foot problems. Report any cuts, sores, or bruises to your health care provider immediately. SEEK MEDICAL CARE IF:   You have an injury that is not healing.  You have cuts or breaks in the skin.  You have an ingrown nail.  You notice redness on your legs or feet.  You feel burning or tingling in your legs or feet.  You have pain or cramps in your legs and feet.  Your legs or feet are numb.  Your feet always feel cold. SEEK IMMEDIATE MEDICAL CARE IF:   There is increasing redness,   swelling, or pain in or around a wound.  There is a red line that goes up your leg.  Pus is coming from a wound.  You develop a fever or as directed by your health care provider.  You notice a bad smell coming from an ulcer or wound. Document Released: 08/24/2000 Document Revised: 04/29/2013 Document Reviewed: 02/03/2013 [employer] Patient Information 2014 [employer].  

## 2013-11-13 NOTE — Progress Notes (Signed)
   Subjective:    Patient ID: Maria [name], female    DOB: 11-23-1925, 80 y.o.   MRN: [id number]  HPI Comments: "I need these toenails trimmed and a corn on my little toe"     Review of Systems no new changes or findings patient does continue to have severe hyperesthesia secondary diabetic neuropathy.     Objective:   Physical Exam Partially objective findings DP +2/4 bilateral PT one over 4 bilateral. Capillary fill time 4 seconds all digits temperature warm turgor normal diminished normal plantar response DTRs are listed neurologically skin color pigment normal hair growth absent mild varicosities noted. Nails thick brittle friable criptotic incurvated 1 through 5 bilateral painful tender and symptomatic both on palpation and with enclosed shoe wear. On Thornell Mule testing there is absent sensation digits plantar foot and arch no other new changes noted. No secondary infections identified at this time multiple keratoses HD 5 right and sub-distal clavus second third right. No secondary infection is noted       Assessment & Plan:  Assessment diabetes with complications and peripheral neuropathy and neuralgia plan at this time debridement of painful mycotic nails 1 through 5 bilateral the presence of diabetes as well as onychomycosis and complications return for future palliative care is needed also did debride keratoses fifth digit right foot maintain appropriate coming shoes recheck in 3 months for followup  [name]

## 2014-01-06 ENCOUNTER — Ambulatory Visit (INDEPENDENT_AMBULATORY_CARE_PROVIDER_SITE_OTHER): Payer: Medicare Other | Admitting: Ophthalmology

## 2014-01-06 DIAGNOSIS — I10 Essential (primary) hypertension: Secondary | ICD-10-CM

## 2014-01-06 DIAGNOSIS — E1165 Type 2 diabetes mellitus with hyperglycemia: Secondary | ICD-10-CM

## 2014-01-06 DIAGNOSIS — H3530 Unspecified macular degeneration: Secondary | ICD-10-CM

## 2014-01-06 DIAGNOSIS — E11319 Type 2 diabetes mellitus with unspecified diabetic retinopathy without macular edema: Secondary | ICD-10-CM

## 2014-01-06 DIAGNOSIS — H35039 Hypertensive retinopathy, unspecified eye: Secondary | ICD-10-CM

## 2014-01-06 DIAGNOSIS — H43819 Vitreous degeneration, unspecified eye: Secondary | ICD-10-CM

## 2014-01-06 DIAGNOSIS — E1139 Type 2 diabetes mellitus with other diabetic ophthalmic complication: Secondary | ICD-10-CM

## 2014-02-19 ENCOUNTER — Ambulatory Visit (INDEPENDENT_AMBULATORY_CARE_PROVIDER_SITE_OTHER): Payer: Medicare Other

## 2014-02-19 ENCOUNTER — Other Ambulatory Visit: Payer: Self-pay | Admitting: *Deleted

## 2014-02-19 VITALS — BP 161/66 | HR 73 | Resp 18 | Ht 61.0 in | Wt 199.0 lb

## 2014-02-19 DIAGNOSIS — E1140 Type 2 diabetes mellitus with diabetic neuropathy, unspecified: Secondary | ICD-10-CM

## 2014-02-19 DIAGNOSIS — B351 Tinea unguium: Secondary | ICD-10-CM

## 2014-02-19 DIAGNOSIS — E1142 Type 2 diabetes mellitus with diabetic polyneuropathy: Secondary | ICD-10-CM

## 2014-02-19 DIAGNOSIS — E1149 Type 2 diabetes mellitus with other diabetic neurological complication: Secondary | ICD-10-CM

## 2014-02-19 DIAGNOSIS — M79609 Pain in unspecified limb: Secondary | ICD-10-CM

## 2014-02-19 MED ORDER — FA-PYRIDOXINE-CYANOCOBALAMIN 2.5-25-2 MG PO TABS: 1.0000 | ORAL_TABLET | Freq: Every day | ORAL | Status: DC

## 2014-02-19 MED ORDER — ATORVASTATIN CALCIUM 40 MG PO TABS: 40.0000 mg | ORAL_TABLET | Freq: Every day | ORAL | Status: DC

## 2014-02-19 NOTE — Telephone Encounter (Signed)
erx refill sent to folic acid and atorvastatin

## 2014-02-19 NOTE — Progress Notes (Signed)
   Subjective:    Patient ID: Maria [name], female    DOB: 05-06-1926, 80 y.o.   MRN: [id number]  HPI Comments: Pt presents for debridement, and discuss diabetic shoes.     Review of Systems no new findings or systemic changes noted     Objective:   Physical Exam Vascular status is intact and unchanged pedal pulses palpable DP +2/4 PT one over 4 bilateral. Capillary fill time 3 seconds all digits epicritic and proprioceptive sensations diminished on Semmes Weinstein patient severe hyperesthesia to diabetic peripheral neuropathy left foot more so than right has not been able tolerate the medication as well and continues to suffer with running and paresthesias hypersensitivity. Orthopedic biomechanical exam rectus foot type mild digital contractures mild HAV deformity noted neurologically nails thick brittle crumbly friable discolored and darkened and yellowed 1 through 5 bilateral hallux and second bilateral are the most severe and painful with a debridement and on palpation and ambulation.       Assessment & Plan:  Assessment this time his diabetes with complications peripheral neuropathy and neuralgia patient continues to have thick painful mycotic nails 1 through 5 bilateral which are debrided at this time continue with [facility] care in 3 months for an as-needed basis for followup  [name]

## 2014-02-19 NOTE — Patient Instructions (Signed)
Diabetes and Foot Care Diabetes may cause you to have problems because of poor blood supply (circulation) to your feet and legs. This may cause the skin on your feet to become thinner, break easier, and heal more slowly. Your skin may become dry, and the skin may peel and crack. You may also have nerve damage in your legs and feet causing decreased feeling in them. You may not notice minor injuries to your feet that could lead to infections or more serious problems. Taking care of your feet is one of the most important things you can do for yourself.  HOME CARE INSTRUCTIONS  Wear shoes at all times, even in the house. Do not go barefoot. Bare feet are easily injured.  Check your feet daily for blisters, cuts, and redness. If you cannot see the bottom of your feet, use a mirror or ask someone for help.  Wash your feet with warm water (do not use hot water) and mild soap. Then pat your feet and the areas between your toes until they are completely dry. Do not soak your feet as this can dry your skin.  Apply a moisturizing lotion or petroleum jelly (that does not contain alcohol and is unscented) to the skin on your feet and to dry, brittle toenails. Do not apply lotion between your toes.  Trim your toenails straight across. Do not dig under them or around the cuticle. File the edges of your nails with an emery board or nail file.  Do not cut corns or calluses or try to remove them with medicine.  Wear clean socks or stockings every day. Make sure they are not too tight. Do not wear knee-high stockings since they may decrease blood flow to your legs.  Wear shoes that fit properly and have enough cushioning. To break in new shoes, wear them for just a few hours a day. This prevents you from injuring your feet. Always look in your shoes before you put them on to be sure there are no objects inside.  Do not cross your legs. This may decrease the blood flow to your feet.  If you find a minor scrape,  cut, or break in the skin on your feet, keep it and the skin around it clean and dry. These areas may be cleansed with mild soap and water. Do not cleanse the area with peroxide, alcohol, or iodine.  When you remove an adhesive bandage, be sure not to damage the skin around it.  If you have a wound, look at it several times a day to make sure it is healing.  Do not use heating pads or hot water bottles. They may burn your skin. If you have lost feeling in your feet or legs, you may not know it is happening until it is too late.  Make sure your health care provider performs a complete foot exam at least annually or more often if you have foot problems. Report any cuts, sores, or bruises to your health care provider immediately. SEEK MEDICAL CARE IF:   You have an injury that is not healing.  You have cuts or breaks in the skin.  You have an ingrown nail.  You notice redness on your legs or feet.  You feel burning or tingling in your legs or feet.  You have pain or cramps in your legs and feet.  Your legs or feet are numb.  Your feet always feel cold. SEEK IMMEDIATE MEDICAL CARE IF:   There is increasing redness,   swelling, or pain in or around a wound.  There is a red line that goes up your leg.  Pus is coming from a wound.  You develop a fever or as directed by your health care provider.  You notice a bad smell coming from an ulcer or wound. Document Released: 08/24/2000 Document Revised: 04/29/2013 Document Reviewed: 02/03/2013 [employer] Patient Information 2014 [employer].  

## 2014-05-19 ENCOUNTER — Telehealth: Payer: Self-pay | Admitting: Cardiovascular Disease

## 2014-05-19 MED ORDER — HYDRALAZINE HCL 25 MG PO TABS: 25.0000 mg | ORAL_TABLET | Freq: Three times a day (TID) | ORAL | Status: DC

## 2014-05-19 NOTE — Telephone Encounter (Signed)
Pt needs a refill on her Hydralazine.Please call this to [facility].

## 2014-05-19 NOTE — Telephone Encounter (Signed)
Rx was sent to pharmacy electronically. 

## 2014-05-21 ENCOUNTER — Ambulatory Visit (INDEPENDENT_AMBULATORY_CARE_PROVIDER_SITE_OTHER): Payer: Medicare Other

## 2014-05-21 DIAGNOSIS — M79676 Pain in unspecified toe(s): Secondary | ICD-10-CM

## 2014-05-21 DIAGNOSIS — B351 Tinea unguium: Secondary | ICD-10-CM

## 2014-05-21 DIAGNOSIS — E1140 Type 2 diabetes mellitus with diabetic neuropathy, unspecified: Secondary | ICD-10-CM

## 2014-05-21 DIAGNOSIS — M79609 Pain in unspecified limb: Secondary | ICD-10-CM

## 2014-05-21 NOTE — Progress Notes (Signed)
   Subjective:    Patient ID: Maria [name], female    DOB: 02-Nov-1925, 80 y.o.   MRN: [id number]  HPI Toenails trim.   Review of Systems no new findings or systemic changes noted     Objective:   Physical Exam Vascular status is intact and unchanged DP +2/4 PT plus one over 4 bilateral capillary refill time 3 seconds +1 edema noted epicritic sensation diminished toes and forefoot no paresthesias noted as well. Nails thick brittle crumbly friable discolored 1 through 5 bilateral painful and tender also keratoses HD 5 right is debrided and the presence of pain in symptomology diabetes and complications no secondary wounds no ulcers no infections noted digital contractures confirmed with arthropathy confirmed.       Assessment & Plan:  Assessment this time his diabetes with history peripheral neuropathy and complications painful mycotic nails 1 through 5 bilateral debridement at this time keratoses fifth right is debrided measurements and molds for diabetic extra-depth shoes and custom inlays are carried out at this time patient does have a history of complications would benefit from shoes inlays at this time will be called when shoes ready for fitting otherwise 3 month followup for diabetic foot and nail care in the future  [name]

## 2014-05-21 NOTE — Patient Instructions (Signed)
Diabetes and Foot Care Diabetes may cause you to have problems because of poor blood supply (circulation) to your feet and legs. This may cause the skin on your feet to become thinner, break easier, and heal more slowly. Your skin may become dry, and the skin may peel and crack. You may also have nerve damage in your legs and feet causing decreased feeling in them. You may not notice minor injuries to your feet that could lead to infections or more serious problems. Taking care of your feet is one of the most important things you can do for yourself.  HOME CARE INSTRUCTIONS  Wear shoes at all times, even in the house. Do not go barefoot. Bare feet are easily injured.  Check your feet daily for blisters, cuts, and redness. If you cannot see the bottom of your feet, use a mirror or ask someone for help.  Wash your feet with warm water (do not use hot water) and mild soap. Then pat your feet and the areas between your toes until they are completely dry. Do not soak your feet as this can dry your skin.  Apply a moisturizing lotion or petroleum jelly (that does not contain alcohol and is unscented) to the skin on your feet and to dry, brittle toenails. Do not apply lotion between your toes.  Trim your toenails straight across. Do not dig under them or around the cuticle. File the edges of your nails with an emery board or nail file.  Do not cut corns or calluses or try to remove them with medicine.  Wear clean socks or stockings every day. Make sure they are not too tight. Do not wear knee-high stockings since they may decrease blood flow to your legs.  Wear shoes that fit properly and have enough cushioning. To break in new shoes, wear them for just a few hours a day. This prevents you from injuring your feet. Always look in your shoes before you put them on to be sure there are no objects inside.  Do not cross your legs. This may decrease the blood flow to your feet.  If you find a minor scrape,  cut, or break in the skin on your feet, keep it and the skin around it clean and dry. These areas may be cleansed with mild soap and water. Do not cleanse the area with peroxide, alcohol, or iodine.  When you remove an adhesive bandage, be sure not to damage the skin around it.  If you have a wound, look at it several times a day to make sure it is healing.  Do not use heating pads or hot water bottles. They may burn your skin. If you have lost feeling in your feet or legs, you may not know it is happening until it is too late.  Make sure your health care provider performs a complete foot exam at least annually or more often if you have foot problems. Report any cuts, sores, or bruises to your health care provider immediately. SEEK MEDICAL CARE IF:   You have an injury that is not healing.  You have cuts or breaks in the skin.  You have an ingrown nail.  You notice redness on your legs or feet.  You feel burning or tingling in your legs or feet.  You have pain or cramps in your legs and feet.  Your legs or feet are numb.  Your feet always feel cold. SEEK IMMEDIATE MEDICAL CARE IF:   There is increasing redness,   swelling, or pain in or around a wound.  There is a red line that goes up your leg.  Pus is coming from a wound.  You develop a fever or as directed by your health care provider.  You notice a bad smell coming from an ulcer or wound. Document Released: 08/24/2000 Document Revised: 04/29/2013 Document Reviewed: 02/03/2013 [employer] Patient Information 2015 [employer]. This information is not intended to replace advice given to you by your health care provider. Make sure you discuss any questions you have with your health care provider.  

## 2014-05-24 ENCOUNTER — Ambulatory Visit (INDEPENDENT_AMBULATORY_CARE_PROVIDER_SITE_OTHER): Payer: Medicare Other | Admitting: *Deleted

## 2014-05-24 DIAGNOSIS — E1149 Type 2 diabetes mellitus with other diabetic neurological complication: Secondary | ICD-10-CM

## 2014-05-24 DIAGNOSIS — E1142 Type 2 diabetes mellitus with diabetic polyneuropathy: Secondary | ICD-10-CM

## 2014-05-24 DIAGNOSIS — E1140 Type 2 diabetes mellitus with diabetic neuropathy, unspecified: Secondary | ICD-10-CM

## 2014-05-24 NOTE — Progress Notes (Signed)
Took a foam impression of patients feet for custom diabetic insoles x 3

## 2014-05-25 ENCOUNTER — Telehealth: Payer: Self-pay | Admitting: *Deleted

## 2014-05-25 NOTE — Telephone Encounter (Signed)
Faxed [facility] to [facility]

## 2014-06-04 ENCOUNTER — Ambulatory Visit: Payer: Medicare Other

## 2014-06-04 DIAGNOSIS — M79676 Pain in unspecified toe(s): Secondary | ICD-10-CM

## 2014-06-04 DIAGNOSIS — E1140 Type 2 diabetes mellitus with diabetic neuropathy, unspecified: Secondary | ICD-10-CM

## 2014-06-04 NOTE — Progress Notes (Signed)
   Subjective:    Patient ID: Maria [name], female    DOB: [birth year], 80 y.o.   MRN: [id number]  HPI REDO IMPRESSION INSOLES.   Review of Systems     Objective:   Physical Exam        Assessment & Plan:

## 2014-08-10 ENCOUNTER — Telehealth: Payer: Self-pay

## 2014-08-10 MED ORDER — EZETIMIBE 10 MG PO TABS: 10.0000 mg | ORAL_TABLET | Freq: Every day | ORAL | Status: DC

## 2014-08-10 MED ORDER — METOLAZONE 2.5 MG PO TABS: 2.5000 mg | ORAL_TABLET | ORAL | Status: DC

## 2014-08-10 MED ORDER — ISOSORBIDE MONONITRATE ER 30 MG PO TB24: 30.0000 mg | ORAL_TABLET | Freq: Every day | ORAL | Status: DC

## 2014-08-10 NOTE — Telephone Encounter (Signed)
Patient walked in with prescription refills from her pharmacy for Isosorbide Mononitrate, Metolazone, and Zetia. Medications refilled, patient notified. Patient also notified she needs an appointment for future refill.

## 2014-08-11 ENCOUNTER — Telehealth: Payer: Self-pay | Admitting: Cardiovascular Disease

## 2014-08-11 NOTE — Telephone Encounter (Signed)
Closed enocunter °

## 2014-08-24 ENCOUNTER — Ambulatory Visit: Payer: Medicare Other

## 2014-08-24 ENCOUNTER — Ambulatory Visit (INDEPENDENT_AMBULATORY_CARE_PROVIDER_SITE_OTHER): Payer: Medicare Other

## 2014-08-24 DIAGNOSIS — E1140 Type 2 diabetes mellitus with diabetic neuropathy, unspecified: Secondary | ICD-10-CM

## 2014-08-24 DIAGNOSIS — B351 Tinea unguium: Secondary | ICD-10-CM

## 2014-08-24 DIAGNOSIS — M79673 Pain in unspecified foot: Secondary | ICD-10-CM

## 2014-08-24 NOTE — Progress Notes (Signed)
   Subjective:    Patient ID: Maria [name], female    DOB: October 02, 1925, 80 y.o.   MRN: [id number]  HPI  Pt presents for nail debridment, patient also presented for pickup of diabetic extra-depth shoes and insoles.  Review of Systems no new findings or systemic changes noted     Objective:   Physical Exam Neurovascular status is unchanged pedal pulses palpable DP +2 PT 1 over 4 bilateral Refill time 3-4 seconds. Mild edema and varicosities noted. Nails 1 through 5 painful tender criptotic incurvated friable brittle are debrided at this time also keratoses fifth right there is minimal keratosis this time may be debrided at a future date. No open wounds no ulcers no secondary infection is noted at this time       Assessment & Plan:  Assessment diabetes with history peripheral neuropathy and complications painful thick brittle crumbly friable mycotic nails 1 through 5 are debrided at this time also debrided monitor keratoses 1 pair of custom insoles are 1 pair of extra depth shoes and 3 pairs of custom molded dual density Plastizote inlays are dispensed the shoes inlays fit and contour well full contact the patient's feet and arches. Patient given oral and written instructions for shoewear break and has been diabetic shoes previously just replacing at this time. 0.3 months for continued palliative care. Should note patient is was companion died this past month, patient appears to be coping well however she did cry during her visit. Mr. Maria [name] died sometime within the past month. Patient is advised to follow-up for diabetic foot nail care in 3 months  Maria [name] DPM

## 2014-09-07 ENCOUNTER — Ambulatory Visit (INDEPENDENT_AMBULATORY_CARE_PROVIDER_SITE_OTHER): Payer: Medicare Other | Admitting: Cardiovascular Disease

## 2014-09-07 ENCOUNTER — Encounter: Payer: Self-pay | Admitting: Cardiovascular Disease

## 2014-09-07 VITALS — BP 130/60 | HR 65 | Ht 62.0 in | Wt 201.5 lb

## 2014-09-07 DIAGNOSIS — G4733 Obstructive sleep apnea (adult) (pediatric): Secondary | ICD-10-CM

## 2014-09-07 DIAGNOSIS — R600 Localized edema: Secondary | ICD-10-CM

## 2014-09-07 DIAGNOSIS — E785 Hyperlipidemia, unspecified: Secondary | ICD-10-CM

## 2014-09-07 DIAGNOSIS — I10 Essential (primary) hypertension: Secondary | ICD-10-CM

## 2014-09-07 DIAGNOSIS — Z9989 Dependence on other enabling machines and devices: Principal | ICD-10-CM

## 2014-09-07 MED ORDER — HYDRALAZINE HCL 25 MG PO TABS: 25.0000 mg | ORAL_TABLET | Freq: Three times a day (TID) | ORAL | Status: DC

## 2014-09-07 MED ORDER — METOLAZONE 2.5 MG PO TABS: 2.5000 mg | ORAL_TABLET | ORAL | Status: DC

## 2014-09-07 NOTE — Assessment & Plan Note (Signed)
History of hyperlipidemia on statin therapy. This is followed by her PCP

## 2014-09-07 NOTE — Patient Instructions (Signed)
Your physician wants you to follow-up in 1 year with Dr. Berry. You will receive a reminder letter in the mail 2 months in advance. If you do not receive a letter, please call our office to schedule the follow-up appointment.  

## 2014-09-07 NOTE — Assessment & Plan Note (Signed)
History of obstructive sleep apnea on C Pap

## 2014-09-07 NOTE — Assessment & Plan Note (Signed)
History of hypertension with blood pressure measurements at 130/60 on amlodipine, hydralazine, and Micardis .Marland Kitchen Continue current medications at current doses

## 2014-09-07 NOTE — Progress Notes (Signed)
09/07/2014 Maria [name]   Feb 08, 1926  [id number]  Primary Physician Maximino Greenland, MD Primary Cardiologist: Lorretta Harp MD Renae Gloss   HPI:  The patient is an 80 year old, moderately overweight, widowed Caucasian female, mother of 2, grandmother to 2 grandchildren who I last saw 12 months ago.  Her problems include hypertension, hyperlipidemia, diabetes and obstructive sleep apnea on CPAP. She has normal coronary arteries and normal LV function by cath that I performed February of this year. She does have diastolic dysfunction. When I saw her back in February she was complaining of dyspnea and I did begin her on Zaroxolyn twice a week which resulted in some improvement in her symptoms as well as in her lower extremity edema, though, today she clearly admits to dietary indiscretion with regards to salt. She was admitted to [facility] for 4 days last month because of diverticulitis. Since her last office visit she continues to have dietary indiscretion related to salt with significant lower extremity edema and shortness of breath as well as palpitations. She does complain of occasional episodes of substernal chest pain but she has normal coronary arteries by cath which I performed 11/08/2010. Her edema is improved today.  Current Outpatient Prescriptions  Medication Sig Dispense Refill  . ALPRAZolam (XANAX) 0.25 MG tablet Take 0.25 mg by mouth 2 (two) times daily as needed. for anxiety  0  . amLODipine (NORVASC) 5 MG tablet Take 2.5 mg by mouth every morning.     Marland Kitchen aspirin 325 MG EC tablet Take 325 mg by mouth at bedtime.     Marland Kitchen atorvastatin (LIPITOR) 40 MG tablet Take 1 tablet (40 mg total) by mouth daily. 90 tablet 3  . Cholecalciferol (VITAMIN D-3) 1000 UNITS CAPS Take 1 capsule by mouth daily.    . citalopram (CELEXA) 40 MG tablet Take 40 mg by mouth daily.    . clonazePAM (KLONOPIN) 0.5 MG tablet Take 0.5 mg by mouth at bedtime.    . docusate sodium (COLACE) 50 MG  capsule Take by mouth 2 (two) times daily as needed for constipation.    Marland Kitchen donepezil (ARICEPT) 10 MG tablet Take 10 mg by mouth daily.    Marland Kitchen ezetimibe (ZETIA) 10 MG tablet Take 1 tablet (10 mg total) by mouth at bedtime. 90 tablet 0  . folic acid-pyridoxine-cyancobalamin (FOLBIC) 2.5-25-2 MG TABS Take 1 tablet by mouth daily. 90 each 3  . furosemide (LASIX) 40 MG tablet Take 1 tablet (40 mg total) by mouth 2 (two) times daily. 180 tablet 3  . hydrALAZINE (APRESOLINE) 25 MG tablet Take 1 tablet (25 mg total) by mouth 3 (three) times daily. 270 tablet 0  . insulin glargine (LANTUS) 100 UNIT/ML injection Inject 15 Units into the skin at bedtime.    . isosorbide mononitrate (IMDUR) 30 MG 24 hr tablet Take 1 tablet (30 mg total) by mouth daily. 90 tablet 0  . loratadine (CLARITIN) 10 MG tablet Take 10 mg by mouth every morning.    . metolazone (ZAROXOLYN) 2.5 MG tablet Take 1 tablet (2.5 mg total) by mouth every other day. 45 tablet 0  . potassium chloride (K-DUR) 10 MEQ tablet Take 1 tablet (10 mEq total) by mouth 2 (two) times daily. 180 tablet 3  . potassium chloride (K-DUR,KLOR-CON) 10 MEQ tablet     . sulfamethoxazole-trimethoprim (BACTRIM DS) 800-160 MG per tablet     . telmisartan (MICARDIS) 40 MG tablet Take 1 tablet (40 mg total) by mouth daily. 90 tablet 3  No current facility-administered medications for this visit.    Allergies  Allergen Reactions  . Ace Inhibitors Other (See Comments)    Angioedema   . Diovan [Valsartan]     History   Social History  . Marital Status: Widowed    Spouse Name: N/A    Number of Children: N/A  . Years of Education: N/A   Occupational History  . Not on file.   Social History Main Topics  . Smoking status: Never Smoker   . Smokeless tobacco: Never Used  . Alcohol Use: No  . Drug Use: No  . Sexual Activity: No   Other Topics Concern  . Not on file   Social History Narrative     Review of Systems: General: negative for chills, fever,  night sweats or weight changes.  Cardiovascular: negative for chest pain, dyspnea on exertion, edema, orthopnea, palpitations, paroxysmal nocturnal dyspnea or shortness of breath Dermatological: negative for rash Respiratory: negative for cough or wheezing Urologic: negative for hematuria Abdominal: negative for nausea, vomiting, diarrhea, bright red blood per rectum, melena, or hematemesis Neurologic: negative for visual changes, syncope, or dizziness All other systems reviewed and are otherwise negative except as noted above.    Blood pressure 130/60, pulse 65, height 5\' 2"  (1.575 m), weight 201 lb 8 oz (91.4 kg).  General appearance: alert and no distress Neck: no adenopathy, no carotid bruit, no JVD, supple, symmetrical, trachea midline and thyroid not enlarged, symmetric, no tenderness/mass/nodules Lungs: clear to auscultation bilaterally Heart: regular rate and rhythm, S1, S2 normal, no murmur, click, rub or gallop Extremities: extremities normal, atraumatic, no cyanosis or edema  EKG normal sinus rhythm at 65 without ST or T-wave changes. I personally reviewed this EKG  ASSESSMENT AND PLAN:   Obstructive sleep apnea on CPAP History of obstructive sleep apnea on C Pap  Hypertension History of hypertension with blood pressure measurements at 130/60 on amlodipine, hydralazine, and Micardis .Marland Kitchen Continue current medications at current doses  Hyperlipidemia History of hyperlipidemia on statin therapy. This is followed by her PCP  Bilateral lower extremity edema History of bilateral lower extremity edema on furosemide and Zaroxolyn. She is aware of salt restriction. Her edema is actually fairly well controlled today      Lorretta Harp MD [facility], [facility] 09/07/2014 11:50 AM

## 2014-09-07 NOTE — Assessment & Plan Note (Signed)
History of bilateral lower extremity edema on furosemide and Zaroxolyn. She is aware of salt restriction. Her edema is actually fairly well controlled today

## 2014-11-11 ENCOUNTER — Encounter (HOSPITAL_COMMUNITY): Payer: Self-pay | Admitting: Emergency Medicine

## 2014-11-11 ENCOUNTER — Emergency Department (HOSPITAL_COMMUNITY)
Admission: EM | Admit: 2014-11-11 | Discharge: 2014-11-11 | Disposition: A | Discharge status: Home / Self Care | Payer: Medicare Other | Attending: Emergency Medicine | Admitting: Emergency Medicine

## 2014-11-11 DIAGNOSIS — Z8673 Personal history of transient ischemic attack (TIA), and cerebral infarction without residual deficits: Secondary | ICD-10-CM | POA: Insufficient documentation

## 2014-11-11 DIAGNOSIS — M1990 Unspecified osteoarthritis, unspecified site: Secondary | ICD-10-CM | POA: Diagnosis not present

## 2014-11-11 DIAGNOSIS — E119 Type 2 diabetes mellitus without complications: Secondary | ICD-10-CM | POA: Insufficient documentation

## 2014-11-11 DIAGNOSIS — Z792 Long term (current) use of antibiotics: Secondary | ICD-10-CM | POA: Diagnosis not present

## 2014-11-11 DIAGNOSIS — R011 Cardiac murmur, unspecified: Secondary | ICD-10-CM | POA: Diagnosis not present

## 2014-11-11 DIAGNOSIS — G4730 Sleep apnea, unspecified: Secondary | ICD-10-CM | POA: Diagnosis not present

## 2014-11-11 DIAGNOSIS — F329 Major depressive disorder, single episode, unspecified: Secondary | ICD-10-CM | POA: Diagnosis not present

## 2014-11-11 DIAGNOSIS — Z7982 Long term (current) use of aspirin: Secondary | ICD-10-CM | POA: Insufficient documentation

## 2014-11-11 DIAGNOSIS — F419 Anxiety disorder, unspecified: Secondary | ICD-10-CM | POA: Diagnosis not present

## 2014-11-11 DIAGNOSIS — E785 Hyperlipidemia, unspecified: Secondary | ICD-10-CM | POA: Diagnosis not present

## 2014-11-11 DIAGNOSIS — M542 Cervicalgia: Secondary | ICD-10-CM | POA: Diagnosis present

## 2014-11-11 DIAGNOSIS — I10 Essential (primary) hypertension: Secondary | ICD-10-CM | POA: Diagnosis not present

## 2014-11-11 DIAGNOSIS — Z794 Long term (current) use of insulin: Secondary | ICD-10-CM | POA: Diagnosis not present

## 2014-11-11 DIAGNOSIS — Z79899 Other long term (current) drug therapy: Secondary | ICD-10-CM | POA: Diagnosis not present

## 2014-11-11 DIAGNOSIS — Z9981 Dependence on supplemental oxygen: Secondary | ICD-10-CM | POA: Diagnosis not present

## 2014-11-11 LAB — CBG MONITORING, ED: Glucose-Capillary: 88 mg/dL (ref 70–99)

## 2014-11-11 MED ORDER — DIAZEPAM 2 MG PO TABS: 2.0000 mg | ORAL_TABLET | Freq: Two times a day (BID) | ORAL | Status: AC

## 2014-11-11 MED ORDER — IBUPROFEN 400 MG PO TABS: 400.0000 mg | ORAL_TABLET | Freq: Three times a day (TID) | ORAL | Status: AC

## 2014-11-11 MED ORDER — OXYCODONE-ACETAMINOPHEN 5-325 MG PO TABS: 1.0000 | ORAL_TABLET | Freq: Four times a day (QID) | ORAL | Status: AC | PRN

## 2014-11-11 MED ORDER — OXYCODONE-ACETAMINOPHEN 5-325 MG PO TABS
1.0000 | ORAL_TABLET | Freq: Once | ORAL | Status: AC
  Administered 2014-11-11: 1 via ORAL
  Filled 2014-11-11: qty 1

## 2014-11-11 MED ORDER — DIAZEPAM 2 MG PO TABS
2.0000 mg | ORAL_TABLET | Freq: Once | ORAL | Status: AC
  Administered 2014-11-11: 2 mg via ORAL
  Filled 2014-11-11: qty 1

## 2014-11-11 NOTE — ED Provider Notes (Signed)
CSN: [id number]     Arrival date & time 11/11/14  1445 History   First MD Initiated Contact with Patient 11/11/14 1501     Chief Complaint  Patient presents with  . Neck Pain     (Consider location/radiation/quality/duration/timing/severity/associated sxs/prior Treatment) HPI Patient p/w new neck pain. Onset was atraumatic, and subacute, about 4 hours pta. Since onset there has been focal sharp pain in the L mid-parasternal area. Pain is worse w motion, particularly of the neck. No concurrent HA, speech difficulty, weakness, arm pain, chest pain. No relief w percocet. Patient was in her [facility] prior to the onset of Sx. Past Medical History  Diagnosis Date  . Diabetes mellitus   . Hypertension   . Sleep apnea     cpap  . TIA (transient ischemic attack)   . Heart murmur   . Stroke     3 x tias  . Blood transfusion   . Arthritis   . Anxiety   . Depression   . Edema   . Hyperlipidemia    Past Surgical History  Procedure Laterality Date  . Partial hysterectomy    . Abdominal hysterectomy    . Appendectomy    . Transthoracic echocardiogram  [id number]  . Nm myoview ltd  [id number]    post stress left ventricle is normal in size, ejection fraction is 65%, normal myocardial perfusion study, low risk scan   Family History  Problem Relation Age of Onset  . Prostate cancer     History  Substance Use Topics  . Smoking status: Never Smoker   . Smokeless tobacco: Never Used  . Alcohol Use: No   OB History    No data available     Review of Systems  Constitutional:       Per HPI, otherwise negative  HENT:       Per HPI, otherwise negative  Respiratory:       Per HPI, otherwise negative  Cardiovascular:       Per HPI, otherwise negative  Gastrointestinal: Negative for nausea and vomiting.  Endocrine:       Negative aside from HPI  Genitourinary:       Neg aside from HPI   Musculoskeletal: Positive for neck pain.  Skin: Negative for wound.  Allergic/Immunologic:  Negative for immunocompromised state.  Neurological: Negative for dizziness, seizures, syncope, facial asymmetry, weakness, light-headedness, numbness and headaches.      Allergies  Ace inhibitors and Diovan  Home Medications   Prior to Admission medications   Medication Sig Start Date End Date Taking? Authorizing Provider  ALPRAZolam (XANAX) 0.25 MG tablet Take 0.25 mg by mouth 2 (two) times daily as needed. for anxiety 06/01/14   Historical Provider, MD  amLODipine (NORVASC) 5 MG tablet Take 2.5 mg by mouth every morning.     Historical Provider, MD  aspirin 325 MG EC tablet Take 325 mg by mouth at bedtime.     Historical Provider, MD  atorvastatin (LIPITOR) 40 MG tablet Take 1 tablet (40 mg total) by mouth daily. 02/19/14   Lorretta Harp, MD  Cholecalciferol (VITAMIN D-3) 1000 UNITS CAPS Take 1 capsule by mouth daily.    Historical Provider, MD  citalopram (CELEXA) 40 MG tablet Take 40 mg by mouth daily.    Historical Provider, MD  clonazePAM (KLONOPIN) 0.5 MG tablet Take 0.5 mg by mouth at bedtime.    Historical Provider, MD  docusate sodium (COLACE) 50 MG capsule Take by mouth 2 (two) times daily as needed  for constipation.    Historical Provider, MD  donepezil (ARICEPT) 10 MG tablet Take 10 mg by mouth daily.    Historical Provider, MD  ezetimibe (ZETIA) 10 MG tablet Take 1 tablet (10 mg total) by mouth at bedtime. 08/10/14   Lorretta Harp, MD  folic acid-pyridoxine-cyancobalamin (FOLBIC) 2.5-25-2 MG TABS Take 1 tablet by mouth daily. 02/19/14   Lorretta Harp, MD  furosemide (LASIX) 40 MG tablet Take 1 tablet (40 mg total) by mouth 2 (two) times daily. 10/22/13   Lorretta Harp, MD  hydrALAZINE (APRESOLINE) 25 MG tablet Take 1 tablet (25 mg total) by mouth 3 (three) times daily. 09/07/14   Lorretta Harp, MD  insulin glargine (LANTUS) 100 UNIT/ML injection Inject 15 Units into the skin at bedtime. 03/06/13   Velvet Bathe, MD  isosorbide mononitrate (IMDUR) 30 MG 24 hr tablet  Take 1 tablet (30 mg total) by mouth daily. 08/10/14   Lorretta Harp, MD  loratadine (CLARITIN) 10 MG tablet Take 10 mg by mouth every morning.    Historical Provider, MD  metolazone (ZAROXOLYN) 2.5 MG tablet Take 1 tablet (2.5 mg total) by mouth every other day. 09/07/14   Lorretta Harp, MD  potassium chloride (K-DUR) 10 MEQ tablet Take 1 tablet (10 mEq total) by mouth 2 (two) times daily. 10/22/13   Lorretta Harp, MD  potassium chloride (K-DUR,KLOR-CON) 10 MEQ tablet  06/23/13   Historical Provider, MD  sulfamethoxazole-trimethoprim (BACTRIM DS) 800-160 MG per tablet  05/15/13   Historical Provider, MD  telmisartan (MICARDIS) 40 MG tablet Take 1 tablet (40 mg total) by mouth daily. 10/22/13   Lorretta Harp, MD   BP 129/43 mmHg  Pulse 57  Temp(Src) 98.6 F (37 C) (Oral)  SpO2 98% Physical Exam  Constitutional: She is oriented to person, place, and time. She appears well-developed and well-nourished. No distress.  Well appearing elderly F in NAD, speaking clearly, MAES  HENT:  Head: Normocephalic and atraumatic.  Eyes: Conjunctivae and EOM are normal. Right eye exhibits no discharge. Left eye exhibits no discharge.  Neck: No rigidity. Decreased range of motion present. No tracheal deviation, no edema and no erythema present. No thyromegaly present.    ROM restricted 2/2 pain in L lateral neck NO midline ttp, nor deformity  Cardiovascular: Normal rate, regular rhythm and intact distal pulses.   Pulses in UE symmetric  Pulmonary/Chest: Effort normal and breath sounds normal. No stridor. No respiratory distress.  Abdominal: She exhibits no distension.  Musculoskeletal: She exhibits no edema.  Neurological: She is alert and oriented to person, place, and time. She displays no atrophy and no tremor. No cranial nerve deficit or sensory deficit. She exhibits normal muscle tone. She displays no seizure activity. Coordination normal.  Skin: Skin is warm and dry.  Psychiatric: She has a  normal mood and affect.  Nursing note and vitals reviewed.   ED Course  Procedures (including critical care time) I reviewed her EMR, including carotid US from 2012 which was not remarkable.   4:31 PM Patient states that her pain is "easing off."  I discussed all findings, med instructions and f/u plan with her an a companion.  MDM   Final diagnoses:  Neck pain    Patient p/w atraumatic neck pain. No e/o neurovascular dysfunction. Patient has recent eval w [name] / MRI.  Sx c/w torticollis, and the patient improved w muscle relaxants, ibuprofen, percocet, ice. D/C in stable condition w PMD F/U.  Carmin Muskrat, MD 11/11/14 [phone]

## 2014-11-11 NOTE — Discharge Instructions (Signed)
As discussed, your evaluation today has been largely reassuring.  But, it is important that you monitor your condition carefully, and do not hesitate to return to the ED if you develop new, or concerning changes in your condition.  Your neck pain is likely due to torticollis, or strain of the neck muscles.  Please take all medication as directed.  The next dose of your medication is as follows:  Valium - Friday Morning  Ibuprofen - Thursday bedtime  Percocet - Friday morning  You also need to use ice packs, every four hours for the next three days.  Please be sure to let Dr. Baird Cancer know that you had an Emergency Department evaluation today.

## 2014-11-11 NOTE — ED Notes (Signed)
Bed: [facility] Expected date:  Expected time:  Means of arrival:  Comments: EMS- 81yo F, neck pain

## 2014-11-11 NOTE — ED Notes (Addendum)
Per ems pt from home, co left neck pain this am, denies injury or trauma. Pt is normally ambulatory with walker. Hx HTN. Denies headache. BP 164/72 HR 80 . Per pt CBG 207

## 2014-11-19 ENCOUNTER — Other Ambulatory Visit: Payer: Self-pay | Admitting: Cardiovascular Disease

## 2014-11-19 MED ORDER — ATORVASTATIN CALCIUM 40 MG PO TABS: 40.0000 mg | ORAL_TABLET | Freq: Every day | ORAL | Status: DC

## 2014-11-19 NOTE — Telephone Encounter (Signed)
°  1. Which medications need to be refilled? Atorvastatin 40mg   2. Which pharmacy is medication to be sent to? [facility] on [address]  3. Do they need a 30 day or 90 day supply? 90  4. Would they like a call back once the medication has been sent to the pharmacy? yes

## 2014-11-25 ENCOUNTER — Other Ambulatory Visit: Payer: Self-pay | Admitting: *Deleted

## 2014-11-25 MED ORDER — ISOSORBIDE MONONITRATE ER 30 MG PO TB24: 30.0000 mg | ORAL_TABLET | Freq: Every day | ORAL | Status: DC

## 2014-11-25 MED ORDER — EZETIMIBE 10 MG PO TABS: 10.0000 mg | ORAL_TABLET | Freq: Every day | ORAL | Status: DC

## 2014-11-25 NOTE — Telephone Encounter (Signed)
Patient mailed a letter to Dr Gwenlyn Found asking for a refill on her zetia and [facility] ERXs sent to pharmacy.

## 2014-11-30 ENCOUNTER — Ambulatory Visit (INDEPENDENT_AMBULATORY_CARE_PROVIDER_SITE_OTHER): Payer: Medicare Other

## 2014-11-30 DIAGNOSIS — Q828 Other specified congenital malformations of skin: Secondary | ICD-10-CM | POA: Diagnosis not present

## 2014-11-30 DIAGNOSIS — E1140 Type 2 diabetes mellitus with diabetic neuropathy, unspecified: Secondary | ICD-10-CM | POA: Diagnosis not present

## 2014-11-30 DIAGNOSIS — M79676 Pain in unspecified toe(s): Secondary | ICD-10-CM

## 2014-11-30 DIAGNOSIS — B351 Tinea unguium: Secondary | ICD-10-CM

## 2014-11-30 NOTE — Progress Notes (Signed)
   Subjective:    Patient ID: Maria [name], female    DOB: 08-26-1926, 81 y.o.   MRN: [id number]  HPI Toenails trim.   Review of Systems no new findings or systemic changes     Objective:   Physical Exam Vascular status is unchanged pedal pulses DP +2 PT 1 over 4 bilateral capillary refill time 3 seconds all digits epicritic sensation grossly diminished on Semmes Weinstein the forefoot digits does have hypersensitivity in the toes didn't thickening proptosis incurvation of nails patient has distal pterygium on nails with growth of skin beneath the nail plates. No open wounds no ulcers no secondary infections semirigid digital contractures and arthropathy lesser digits noted as well as the hallux with proptosis of nails painful symptomatic with ambulation and on palpation there is keratoses HD 5 right. Adductovarus rotation of the fifth digit orthopedic exam otherwise unremarkable rectus foot type       Assessment & Plan:  Assessment diabetes history of peripheral neuropathy as well as complications of painful mycotic brittle crumbly friable nails 1 through 5 bilateral which are debrided at this time patient's shoes fit and contour well also still keratotic lesion fifth toe hammertoe with HD 5 right is debrided return for palliative care every 3 months as recommended  [name]

## 2015-01-10 ENCOUNTER — Ambulatory Visit (INDEPENDENT_AMBULATORY_CARE_PROVIDER_SITE_OTHER): Payer: Medicare Other | Admitting: Ophthalmology

## 2015-01-10 DIAGNOSIS — H43813 Vitreous degeneration, bilateral: Secondary | ICD-10-CM | POA: Diagnosis not present

## 2015-01-10 DIAGNOSIS — H35033 Hypertensive retinopathy, bilateral: Secondary | ICD-10-CM | POA: Diagnosis not present

## 2015-01-10 DIAGNOSIS — I10 Essential (primary) hypertension: Secondary | ICD-10-CM

## 2015-01-10 DIAGNOSIS — E11329 Type 2 diabetes mellitus with mild nonproliferative diabetic retinopathy without macular edema: Secondary | ICD-10-CM

## 2015-01-10 DIAGNOSIS — E11319 Type 2 diabetes mellitus with unspecified diabetic retinopathy without macular edema: Secondary | ICD-10-CM | POA: Diagnosis not present

## 2015-02-09 ENCOUNTER — Telehealth: Payer: Self-pay | Admitting: Cardiovascular Disease

## 2015-02-09 MED ORDER — FUROSEMIDE 40 MG PO TABS: 40.0000 mg | ORAL_TABLET | Freq: Two times a day (BID) | ORAL | Status: DC

## 2015-02-09 MED ORDER — POTASSIUM CHLORIDE ER 10 MEQ PO TBCR: 10.0000 meq | EXTENDED_RELEASE_TABLET | Freq: Two times a day (BID) | ORAL | Status: DC

## 2015-02-09 MED ORDER — AMLODIPINE BESYLATE 5 MG PO TABS: 2.5000 mg | ORAL_TABLET | Freq: Every morning | ORAL | Status: DC

## 2015-02-09 MED ORDER — TELMISARTAN 80 MG PO TABS: 80.0000 mg | ORAL_TABLET | Freq: Every day | ORAL | Status: DC

## 2015-02-09 MED ORDER — FA-PYRIDOXINE-CYANOCOBALAMIN 2.5-25-2 MG PO TABS: 1.0000 | ORAL_TABLET | Freq: Every day | ORAL | Status: DC

## 2015-02-09 NOTE — Telephone Encounter (Signed)
Called pt. She reports no pain in chest or arm today. C/o left arm pain yesterday, 2x last week. Cites as occasional, brief, non-severe, does not bother her. She states no symptoms of dyspnea, fatigue, nausea, dizziness, or neck pain.  Did instruct her to go to ED for active chest pain. Set up for appt w/ Dr. Gwenlyn Found for mid-July, per pt request. Refilled meds per pt request.  Offered to request flex schedule PA visit, pt declined - cites 2 doctor's appts this week - requested her to call next week if needed.Reminded her to go to ED for active chest pain, encouraged her to call for any further needs or questions.

## 2015-02-09 NOTE — Telephone Encounter (Signed)
Maria [name] is calling because she is having some pain in her left side of her chest that goes into her arm . This pain comes on and off . Please call   Thanks

## 2015-03-07 ENCOUNTER — Encounter: Payer: Self-pay | Admitting: Podiatry

## 2015-03-07 ENCOUNTER — Ambulatory Visit (INDEPENDENT_AMBULATORY_CARE_PROVIDER_SITE_OTHER): Payer: Medicare Other | Admitting: Podiatry

## 2015-03-07 DIAGNOSIS — M79673 Pain in unspecified foot: Secondary | ICD-10-CM | POA: Diagnosis not present

## 2015-03-07 DIAGNOSIS — Q828 Other specified congenital malformations of skin: Secondary | ICD-10-CM

## 2015-03-07 DIAGNOSIS — B351 Tinea unguium: Secondary | ICD-10-CM

## 2015-03-07 DIAGNOSIS — E1140 Type 2 diabetes mellitus with diabetic neuropathy, unspecified: Secondary | ICD-10-CM

## 2015-03-07 NOTE — Progress Notes (Signed)
   Subjective:    Patient ID: Maria [name], female    DOB: [birth year], 81 y.o.   MRN: [id number]  HPI "Doing my toenails.  This Neuropathy has got me going."    Review of Systems     Objective:   Physical Exam        Assessment & Plan:

## 2015-03-07 NOTE — Progress Notes (Signed)
Subjective:     Patient ID: Maria [name], female   DOB: July 13, 1926, 81 y.o.   MRN: [id number]  HPI long-term diabetic presents with nail disease 1-5 bilateral thick yellow brittle debris that are painful and keratotic lesion fifth toe right that's painful and she cannot cut. States that she has difficulty wearing shoe gear and that her neuropathy has advanced and is being treated by family physician and endocrinologist   Review of Systems     Objective:   Physical Exam Neurovascular status unchanged with thick yellow brittle nailbeds 1-5 both feet that are painful and lesion fifth digit right foot with keratotic-like core. Diminishment of sharp dull and vibratory    Assessment:     Mycotic nail infection with pain and lesion formation fifth digit right foot with pain in at risk diabetic    Plan:     Debridement of nailbeds 1-5 both feet accomplished with debridement of lesion right with no iatrogenic bleeding and at risk diabetic

## 2015-03-08 ENCOUNTER — Ambulatory Visit: Payer: Medicare Other

## 2015-03-25 ENCOUNTER — Encounter: Payer: Self-pay | Admitting: Cardiovascular Disease

## 2015-03-25 ENCOUNTER — Ambulatory Visit (INDEPENDENT_AMBULATORY_CARE_PROVIDER_SITE_OTHER): Payer: Medicare Other | Admitting: Cardiovascular Disease

## 2015-03-25 VITALS — BP 118/58 | HR 62 | Ht 62.0 in | Wt 203.1 lb

## 2015-03-25 DIAGNOSIS — R0602 Shortness of breath: Secondary | ICD-10-CM

## 2015-03-25 DIAGNOSIS — R079 Chest pain, unspecified: Secondary | ICD-10-CM | POA: Diagnosis not present

## 2015-03-25 DIAGNOSIS — I209 Angina pectoris, unspecified: Secondary | ICD-10-CM

## 2015-03-25 DIAGNOSIS — E785 Hyperlipidemia, unspecified: Secondary | ICD-10-CM | POA: Diagnosis not present

## 2015-03-25 DIAGNOSIS — I10 Essential (primary) hypertension: Secondary | ICD-10-CM

## 2015-03-25 DIAGNOSIS — R600 Localized edema: Secondary | ICD-10-CM

## 2015-03-25 MED ORDER — FA-PYRIDOXINE-CYANOCOBALAMIN 2.5-25-2 MG PO TABS: 1.0000 | ORAL_TABLET | Freq: Every day | ORAL | Status: DC

## 2015-03-25 NOTE — Assessment & Plan Note (Signed)
History of hypertension with blood pressure measured at 118/58. She is on hydralazine, amlodipine and myocarditis. Continue current meds at current dosing

## 2015-03-25 NOTE — Assessment & Plan Note (Signed)
History of hyperlipidemia on atorvastatin 40 mg today followed by her PCP

## 2015-03-25 NOTE — Assessment & Plan Note (Signed)
History of chronic lower extremity edema on Zaroxolyn.

## 2015-03-25 NOTE — Assessment & Plan Note (Signed)
Patient complains of left upper extremity discomfort rating to her left shoulder and arm along with shortness of breath. She has been cathed back in February 2006 as well as February 2012 and was found to have normal coronary arteries and normal LV function. She is on oral nitrate. I doubt whether this is cardiac in nature although I am going to get a 2-D echo and a pharmacologic Myoview stress test to rule out ischemic etiology.

## 2015-03-25 NOTE — Progress Notes (Signed)
03/25/2015 Maria [name]   [birth year]  [id number]  Primary Physician Maximino Greenland, MD Primary Cardiologist: Lorretta Harp MD Renae Gloss   HPI:  The patient is an 81 year old, moderately overweight, widowed Caucasian female, mother of 2, grandmother to 2 grandchildren who I last saw 12months ago. Her problems include hypertension, hyperlipidemia, diabetes and obstructive sleep apnea on CPAP. She has normal coronary arteries and normal LV function by cath performed/29/12. She does have diastolic dysfunction. When I saw her back in February she was complaining of dyspnea and I did begin her on Zaroxolyn twice a week which resulted in some improvement in her symptoms as well as in her lower extremity edema, though, today she clearly admits to dietary indiscretion with regards to salt. She was admitted to [facility] for 4 days last month because of diverticulitis. Since her last office visit she continues to have dietary indiscretion related to salt with significant lower extremity edema and shortness of breath as well as palpitations. She does complain of occasional episodes of substernal chest pain but she has normal coronary arteries by cath which I performed 11/08/2010. Her edema is improved today. She continues to complain of chest pain with left upper extremity radiation and dyspnea on exertion. She walks with a walker and is minimally ambulatory.   Current Outpatient Prescriptions  Medication Sig Dispense Refill  . amLODipine (NORVASC) 5 MG tablet Take 0.5 tablets (2.5 mg total) by mouth every morning. 45 tablet 1  . aspirin 325 MG EC tablet Take 325 mg by mouth at bedtime.     Marland Kitchen atorvastatin (LIPITOR) 40 MG tablet Take 1 tablet (40 mg total) by mouth daily. 90 tablet 2  . Cholecalciferol (VITAMIN D) 2000 UNITS tablet Take 2,000 Units by mouth daily.    . citalopram (CELEXA) 20 MG tablet Take 1 tablet by mouth daily.    Marland Kitchen docusate sodium (COLACE) 50 MG capsule Take 100  mg by mouth daily.     Marland Kitchen donepezil (ARICEPT) 10 MG tablet Take 10 mg by mouth daily.    Marland Kitchen ezetimibe (ZETIA) 10 MG tablet Take 1 tablet (10 mg total) by mouth at bedtime. 90 tablet 3  . folic acid-pyridoxine-cyancobalamin (FOLBIC) 2.5-25-2 MG TABS Take 1 tablet by mouth daily. 90 each 1  . furosemide (LASIX) 40 MG tablet Take 1 tablet (40 mg total) by mouth 2 (two) times daily. 180 tablet 1  . hydrALAZINE (APRESOLINE) 25 MG tablet Take 1 tablet (25 mg total) by mouth 3 (three) times daily. 270 tablet 3  . isosorbide mononitrate (IMDUR) 30 MG 24 hr tablet Take 1 tablet (30 mg total) by mouth daily. 90 tablet 3  . LANTUS SOLOSTAR 100 UNIT/ML Solostar Pen Inject 14 Units into the skin every evening.    . loratadine (CLARITIN) 10 MG tablet Take 10 mg by mouth every morning.    . metolazone (ZAROXOLYN) 2.5 MG tablet Take 1 tablet (2.5 mg total) by mouth every other day. (Patient taking differently: Take 2.5 mg by mouth 2 (two) times a week. ) 45 tablet 3  . potassium chloride (K-DUR) 10 MEQ tablet Take 1 tablet (10 mEq total) by mouth 2 (two) times daily. 180 tablet 1  . telmisartan (MICARDIS) 80 MG tablet Take 1 tablet (80 mg total) by mouth daily. 90 tablet 1  . [DISCONTINUED] clonazePAM (KLONOPIN) 0.5 MG tablet Take 0.5 mg by mouth at bedtime.    . [DISCONTINUED] potassium chloride (K-DUR,KLOR-CON) 10 MEQ tablet  No current facility-administered medications for this visit.    Allergies  Allergen Reactions  . Ace Inhibitors Other (See Comments)    Angioedema   . Diovan [Valsartan]     History   Social History  . Marital Status: Widowed    Spouse Name: N/A  . Number of Children: N/A  . Years of Education: N/A   Occupational History  . Not on file.   Social History Main Topics  . Smoking status: Never Smoker   . Smokeless tobacco: Never Used  . Alcohol Use: No  . Drug Use: No  . Sexual Activity: No   Other Topics Concern  . Not on file   Social History Narrative      Review of Systems: General: negative for chills, fever, night sweats or weight changes.  Cardiovascular: negative for chest pain, dyspnea on exertion, edema, orthopnea, palpitations, paroxysmal nocturnal dyspnea or shortness of breath Dermatological: negative for rash Respiratory: negative for cough or wheezing Urologic: negative for hematuria Abdominal: negative for nausea, vomiting, diarrhea, bright red blood per rectum, melena, or hematemesis Neurologic: negative for visual changes, syncope, or dizziness All other systems reviewed and are otherwise negative except as noted above.    Blood pressure 118/58, pulse 62, height 5\' 2"  (1.575 m), weight 203 lb 1.6 oz (92.126 kg).  General appearance: alert and no distress Neck: no adenopathy, no carotid bruit, no JVD, supple, symmetrical, trachea midline and thyroid not enlarged, symmetric, no tenderness/mass/nodules Lungs: clear to auscultation bilaterally Abdomen: soft, non-tender; bowel sounds normal; no masses,  no organomegaly  EKG normal sinus rhythm at 62 without ST or T-wave changes. A person reviewed this EKG  ASSESSMENT AND PLAN:   Hypertension History of hypertension with blood pressure measured at 118/58. She is on hydralazine, amlodipine and myocarditis. Continue current meds at current dosing  Hyperlipidemia History of hyperlipidemia on atorvastatin 40 mg today followed by her PCP  Bilateral lower extremity edema History of chronic lower extremity edema on Zaroxolyn.  Chest pain Patient complains of left upper extremity discomfort rating to her left shoulder and arm along with shortness of breath. She has been cathed back in February 2006 as well as February 2012 and was found to have normal coronary arteries and normal LV function. She is on oral nitrate. I doubt whether this is cardiac in nature although I am going to get a 2-D echo and a pharmacologic Myoview stress test to rule out ischemic  etiology.      Lorretta Harp MD FACP,FACC,FAHA, [facility] 03/25/2015 9:54 AM

## 2015-03-25 NOTE — Patient Instructions (Addendum)
Dr Gwenlyn Found has requested that you have a lexiscan myoview. For further information please visit [email or web address]. Please follow instruction sheet, as given.  Your physician has requested that you have an echocardiogram. Echocardiography is a painless test that uses sound waves to create images of your heart. It provides your doctor with information about the size and shape of your heart and how well your heart's chambers and valves are working. This procedure takes approximately one hour. There are no restrictions for this procedure.   Your physician recommends that you schedule a follow-up appointment in 6 weeks with an extender. Dr Gwenlyn Found recommends that you schedule a follow-up appointment in 6 months You will receive a reminder letter in the mail two months in advance. If you don't receive a letter, please call our office to schedule the follow-up appointment.

## 2015-03-31 ENCOUNTER — Telehealth (HOSPITAL_COMMUNITY): Payer: Self-pay

## 2015-03-31 NOTE — Telephone Encounter (Signed)
Left message on voicemail in reference to upcoming appointment scheduled for 04-05-2015. Phone number given for a call back so details instructions can be given. Oletta Lamas, Agueda Houpt A

## 2015-04-01 ENCOUNTER — Telehealth (HOSPITAL_COMMUNITY): Payer: Self-pay | Admitting: *Deleted

## 2015-04-01 ENCOUNTER — Telehealth (HOSPITAL_COMMUNITY): Payer: Self-pay

## 2015-04-01 NOTE — Telephone Encounter (Signed)
Patient given detailed instructions per Myocardial Perfusion Study Information Sheet for test on 04/05/15 at 9:30. Patient Notified to arrive 15 minutes early, and that it is imperative to arrive on time for appointment to keep from having the test rescheduled. Patient verbalized understanding. Veronia Beets

## 2015-04-01 NOTE — Telephone Encounter (Signed)
Left message on voicemail in reference to upcoming appointment scheduled for 04-05-2015. Phone number given for a call back so details instructions can be given.Oletta Lamas, Jayde Daffin A

## 2015-04-05 ENCOUNTER — Other Ambulatory Visit: Payer: Self-pay

## 2015-04-05 ENCOUNTER — Ambulatory Visit (HOSPITAL_BASED_OUTPATIENT_CLINIC_OR_DEPARTMENT_OTHER): Payer: Medicare Other

## 2015-04-05 ENCOUNTER — Other Ambulatory Visit (HOSPITAL_COMMUNITY): Payer: Self-pay | Admitting: Cardiovascular Disease

## 2015-04-05 ENCOUNTER — Ambulatory Visit (HOSPITAL_COMMUNITY): Payer: Medicare Other | Attending: Cardiovascular Disease

## 2015-04-05 VITALS — Ht 62.0 in | Wt 203.0 lb

## 2015-04-05 DIAGNOSIS — R110 Nausea: Secondary | ICD-10-CM

## 2015-04-05 DIAGNOSIS — R079 Chest pain, unspecified: Secondary | ICD-10-CM | POA: Insufficient documentation

## 2015-04-05 DIAGNOSIS — I059 Rheumatic mitral valve disease, unspecified: Secondary | ICD-10-CM | POA: Diagnosis not present

## 2015-04-05 DIAGNOSIS — R0602 Shortness of breath: Secondary | ICD-10-CM

## 2015-04-05 DIAGNOSIS — I351 Nonrheumatic aortic (valve) insufficiency: Secondary | ICD-10-CM | POA: Insufficient documentation

## 2015-04-05 DIAGNOSIS — I313 Pericardial effusion (noninflammatory): Secondary | ICD-10-CM | POA: Diagnosis not present

## 2015-04-05 LAB — MYOCARDIAL PERFUSION IMAGING
LV dias vol: 81 mL
LV sys vol: 26 mL
Peak HR: 76 {beats}/min
RATE: 0.26
Rest HR: 52 {beats}/min
SDS: 3
SRS: 2
SSS: 5
TID: 1.02

## 2015-04-05 MED ORDER — REGADENOSON 0.4 MG/5ML IV SOLN
0.4000 mg | Freq: Once | INTRAVENOUS | Status: AC
  Administered 2015-04-05: 0.4 mg via INTRAVENOUS

## 2015-04-05 MED ORDER — TECHNETIUM TC 99M SESTAMIBI GENERIC - CARDIOLITE
10.3000 | Freq: Once | INTRAVENOUS | Status: AC | PRN
  Administered 2015-04-05: 10 via INTRAVENOUS

## 2015-04-05 MED ORDER — TECHNETIUM TC 99M SESTAMIBI GENERIC - CARDIOLITE
30.9000 | Freq: Once | INTRAVENOUS | Status: AC | PRN
  Administered 2015-04-05: 30.9 via INTRAVENOUS

## 2015-04-05 MED ORDER — AMINOPHYLLINE 25 MG/ML IV SOLN
75.0000 mg | Freq: Once | INTRAVENOUS | Status: AC
  Administered 2015-04-05: 75 mg via INTRAVENOUS

## 2015-05-12 ENCOUNTER — Telehealth: Payer: Self-pay | Admitting: *Deleted

## 2015-05-12 NOTE — Telephone Encounter (Signed)
Signed Statement of Medical Necessity form was faxed back to [facility]

## 2015-05-25 ENCOUNTER — Encounter: Payer: Self-pay | Admitting: Physician Assistant

## 2015-05-25 ENCOUNTER — Ambulatory Visit (INDEPENDENT_AMBULATORY_CARE_PROVIDER_SITE_OTHER): Payer: Medicare Other | Admitting: Physician Assistant

## 2015-05-25 VITALS — BP 140/60 | HR 70 | Ht 62.0 in | Wt 204.4 lb

## 2015-05-25 DIAGNOSIS — I10 Essential (primary) hypertension: Secondary | ICD-10-CM | POA: Diagnosis not present

## 2015-05-25 DIAGNOSIS — E669 Obesity, unspecified: Secondary | ICD-10-CM | POA: Diagnosis not present

## 2015-05-25 DIAGNOSIS — E0801 Diabetes mellitus due to underlying condition with hyperosmolarity with coma: Secondary | ICD-10-CM

## 2015-05-25 DIAGNOSIS — E785 Hyperlipidemia, unspecified: Secondary | ICD-10-CM

## 2015-05-25 DIAGNOSIS — Z9989 Dependence on other enabling machines and devices: Secondary | ICD-10-CM

## 2015-05-25 DIAGNOSIS — G4733 Obstructive sleep apnea (adult) (pediatric): Secondary | ICD-10-CM

## 2015-05-25 DIAGNOSIS — R0789 Other chest pain: Secondary | ICD-10-CM

## 2015-05-25 NOTE — Progress Notes (Signed)
Patient ID: Maria [name], female   DOB: [birth year], 81 y.o.   MRN: [id number]    Date:  05/25/2015   ID:  Maria [name], DOB [birth year], MRN [id number]  PCP:  Maximino Greenland, MD  Primary Cardiologist:  Wyn Forster chief complaint on file.    History of Present Illness: Maria [name] is a 81 y.o. female moderately overweight, widowed Caucasian female, mother of 2, grandmother to 2 grandchildren who I last saw 23months ago. Her problems include hypertension, hyperlipidemia, diabetes and obstructive sleep apnea on CPAP. She has normal coronary arteries and normal LV function by cath performed/29/12. She does have diastolic dysfunction. When Dr berry saw her back in February she was complaining of dyspnea and  he started her on Zaroxolyn twice a week which resulted in some improvement in her symptoms as well as in her lower extremity edema, though, in July she admitted to dietary indiscretion with regards to salt. She was admitted to [facility] for 4 days in June 2016 because of diverticulitis.  Patient was seen in July was complaining of chest pain. She underwent 2-D echocardiogram and Myoview stress testing both were essentially normal  she did have mildly elevated pulmonary artery pressure and grade 1 diastolic dysfunction.  She presents today to follow-up her 2 studies.  She says the chest pain resolved and otherwise feels well.  Her ejection fractions in the 55-60% range with grade 1 diastolic dysfunction. Peak PA pressure 36 mmHg.  She has sleep apnea and uses CPAP. Stress test was nonischemic.  The patient currently denies nausea, vomiting, fever, chest pain, shortness of breath, orthopnea, dizziness, PND, cough, congestion, abdominal pain, hematochezia, melena, lower extremity edema, claudication.  Study Conclusions  - Left ventricle: The cavity size was normal. Wall thickness was increased in a pattern of moderate LVH. Systolic function was normal. The estimated ejection  fraction was in the range of 55% to 60%. Wall motion was normal; there were no regional wall motion abnormalities. Doppler parameters are consistent with abnormal left ventricular relaxation (grade 1 diastolic dysfunction). - Aortic valve: There was trivial regurgitation. - Mitral valve: Calcified annulus. - Atrial septum: No defect or patent foramen ovale was identified. - Pulmonary arteries: PA peak pressure: 36 mm Hg (S). - Pericardium, extracardiac: A trivial pericardial effusion was identified posterior to the heart.    Wt Readings from Last 3 Encounters:  05/25/15 204 lb 6.4 oz (92.715 kg)  04/05/15 203 lb (92.08 kg)  03/25/15 203 lb 1.6 oz (92.126 kg)     Past Medical History  Diagnosis Date  . Diabetes mellitus   . Hypertension   . Sleep apnea     cpap  . TIA (transient ischemic attack)   . Heart murmur   . Stroke     3 x tias  . Blood transfusion   . Arthritis   . Anxiety   . Depression   . Edema   . Hyperlipidemia     Current Outpatient Prescriptions  Medication Sig Dispense Refill  . amLODipine (NORVASC) 5 MG tablet Take 0.5 tablets (2.5 mg total) by mouth every morning. 45 tablet 1  . aspirin 325 MG EC tablet Take 325 mg by mouth at bedtime.     Marland Kitchen atorvastatin (LIPITOR) 40 MG tablet Take 1 tablet (40 mg total) by mouth daily. 90 tablet 2  . Cholecalciferol (VITAMIN D) 2000 UNITS tablet Take 2,000 Units by mouth daily.    . citalopram (CELEXA) 20 MG tablet Take 1 tablet  by mouth daily.    Marland Kitchen docusate sodium (COLACE) 50 MG capsule Take 100 mg by mouth daily.     Marland Kitchen donepezil (ARICEPT) 10 MG tablet Take 10 mg by mouth daily.    Marland Kitchen ezetimibe (ZETIA) 10 MG tablet Take 1 tablet (10 mg total) by mouth at bedtime. 90 tablet 3  . folic acid-pyridoxine-cyancobalamin (FOLBIC) 2.5-25-2 MG TABS Take 1 tablet by mouth daily. 90 each 3  . furosemide (LASIX) 40 MG tablet Take 1 tablet (40 mg total) by mouth 2 (two) times daily. 180 tablet 1  . hydrALAZINE  (APRESOLINE) 25 MG tablet Take 1 tablet (25 mg total) by mouth 3 (three) times daily. 270 tablet 3  . isosorbide mononitrate (IMDUR) 30 MG 24 hr tablet Take 1 tablet (30 mg total) by mouth daily. 90 tablet 3  . LANTUS SOLOSTAR 100 UNIT/ML Solostar Pen Inject 14 Units into the skin every evening.    . loratadine (CLARITIN) 10 MG tablet Take 10 mg by mouth every morning.    . metolazone (ZAROXOLYN) 2.5 MG tablet Take 1 tablet (2.5 mg total) by mouth every other day. (Patient taking differently: Take 2.5 mg by mouth 2 (two) times a week. ) 45 tablet 3  . potassium chloride (K-DUR) 10 MEQ tablet Take 1 tablet (10 mEq total) by mouth 2 (two) times daily. 180 tablet 1  . telmisartan (MICARDIS) 80 MG tablet Take 1 tablet (80 mg total) by mouth daily. 90 tablet 1  . [DISCONTINUED] clonazePAM (KLONOPIN) 0.5 MG tablet Take 0.5 mg by mouth at bedtime.    . [DISCONTINUED] potassium chloride (K-DUR,KLOR-CON) 10 MEQ tablet      No current facility-administered medications for this visit.    Allergies:    Allergies  Allergen Reactions  . Ace Inhibitors Other (See Comments)    Angioedema   . Diovan [Valsartan]     Social History:  The patient  reports that she has never smoked. She has never used smokeless tobacco. She reports that she does not drink alcohol or use illicit drugs.   Family history:   Family History  Problem Relation Age of Onset  . Prostate cancer      ROS:  Please see the history of present illness.  All other systems reviewed and negative.   PHYSICAL EXAM: VS:  BP 140/60 mmHg  Pulse 70  Ht 5\' 2"  (1.575 m)  Wt 204 lb 6.4 oz (92.715 kg)  BMI 37.38 kg/m2 Obese, well developed, in no acute distress HEENT: Pupils are equal round react to light accommodation extraocular movements are intact.  Neck: no JVDNo cervical lymphadenopathy. Cardiac: Regular rate and rhythm without murmurs rubs or gallops. Lungs:  clear to auscultation bilaterally, no wheezing, rhonchi or rales Ext: no  lower extremity edema.  2+ radial and dorsalis pedis pulses. Skin: warm and dry Neuro:  Grossly normal    ASSESSMENT AND PLAN:  Problem List Items Addressed This Visit    Obstructive sleep apnea on CPAP   Obesity   Hypertension   Hyperlipidemia   Diabetes mellitus - Primary     Chest pain:  Resolved. Normal ejection fraction and nonischemic stress test  Obesity: I'll try to refer her to a registered dietitian however, she has limited transportation available to her.  Hypertension: Blood pressure mildly elevated today however, it was controlled last time. No changes to current medications.  Obstructive sleep apnea: Continue CPAP.  Hyperlipidemia: Continue Lipitor  Diabetes mellitus:  She probably get some diabetes education at the center she  where she lives. Hopefully she will do see in the registered dietitian.

## 2015-05-25 NOTE — Patient Instructions (Signed)
Your physician wants you to follow-up in: 6 MONTHS WITH DR BERRY You will receive a reminder letter in the mail two months in advance. If you don't receive a letter, please call our office to schedule the follow-up appointment.  

## 2015-06-16 ENCOUNTER — Ambulatory Visit: Payer: Medicare Other | Admitting: Podiatry

## 2015-06-21 ENCOUNTER — Encounter: Payer: Self-pay | Admitting: Podiatry

## 2015-06-21 ENCOUNTER — Ambulatory Visit (INDEPENDENT_AMBULATORY_CARE_PROVIDER_SITE_OTHER): Payer: Medicare Other | Admitting: Podiatry

## 2015-06-21 DIAGNOSIS — B351 Tinea unguium: Secondary | ICD-10-CM

## 2015-06-21 DIAGNOSIS — M79676 Pain in unspecified toe(s): Secondary | ICD-10-CM

## 2015-06-21 NOTE — Patient Instructions (Signed)
Will obtain certification for diabetic shoes and notify you to present for measurement for the diabetic shoes  Diabetes and Foot Care Diabetes may cause you to have problems because of poor blood supply (circulation) to your feet and legs. This may cause the skin on your feet to become thinner, break easier, and heal more slowly. Your skin may become dry, and the skin may peel and crack. You may also have nerve damage in your legs and feet causing decreased feeling in them. You may not notice minor injuries to your feet that could lead to infections or more serious problems. Taking care of your feet is one of the most important things you can do for yourself.  HOME CARE INSTRUCTIONS  Wear shoes at all times, even in the house. Do not go barefoot. Bare feet are easily injured.  Check your feet daily for blisters, cuts, and redness. If you cannot see the bottom of your feet, use a mirror or ask someone for help.  Wash your feet with warm water (do not use hot water) and mild soap. Then pat your feet and the areas between your toes until they are completely dry. Do not soak your feet as this can dry your skin.  Apply a moisturizing lotion or petroleum jelly (that does not contain alcohol and is unscented) to the skin on your feet and to dry, brittle toenails. Do not apply lotion between your toes.  Trim your toenails straight across. Do not dig under them or around the cuticle. File the edges of your nails with an emery board or nail file.  Do not cut corns or calluses or try to remove them with medicine.  Wear clean socks or stockings every day. Make sure they are not too tight. Do not wear knee-high stockings since they may decrease blood flow to your legs.  Wear shoes that fit properly and have enough cushioning. To break in new shoes, wear them for just a few hours a day. This prevents you from injuring your feet. Always look in your shoes before you put them on to be sure there are no objects  inside.  Do not cross your legs. This may decrease the blood flow to your feet.  If you find a minor scrape, cut, or break in the skin on your feet, keep it and the skin around it clean and dry. These areas may be cleansed with mild soap and water. Do not cleanse the area with peroxide, alcohol, or iodine.  When you remove an adhesive bandage, be sure not to damage the skin around it.  If you have a wound, look at it several times a day to make sure it is healing.  Do not use heating pads or hot water bottles. They may burn your skin. If you have lost feeling in your feet or legs, you may not know it is happening until it is too late.  Make sure your health care provider performs a complete foot exam at least annually or more often if you have foot problems. Report any cuts, sores, or bruises to your health care provider immediately. SEEK MEDICAL CARE IF:   You have an injury that is not healing.  You have cuts or breaks in the skin.  You have an ingrown nail.  You notice redness on your legs or feet.  You feel burning or tingling in your legs or feet.  You have pain or cramps in your legs and feet.  Your legs or feet are  numb.  Your feet always feel cold. SEEK IMMEDIATE MEDICAL CARE IF:   There is increasing redness, swelling, or pain in or around a wound.  There is a red line that goes up your leg.  Pus is coming from a wound.  You develop a fever or as directed by your health care provider.  You notice a bad smell coming from an ulcer or wound.   This information is not intended to replace advice given to you by your health care provider. Make sure you discuss any questions you have with your health care provider.   Document Released: 08/24/2000 Document Revised: 04/29/2013 Document Reviewed: 02/03/2013 Elsevier Interactive Patient Education [employer].

## 2015-06-21 NOTE — Progress Notes (Signed)
   Subjective:    Patient ID: Maria [name], female    DOB: 1926-07-31, 81 y.o.   MRN: [id number]  HPI This patient presents today for scheduled visit complaining of painful toenails walking wearing shoes and requests toenail debridement. Also, patient is requesting replacement diabetic shoes. She is a diabetic without history of amputation or skin ulceration.   Review of Systems  All other systems reviewed and are negative.      Objective:   Physical Exam  Orientated 3  Vascular: Peripheral edema bilaterally left greater than right DP and PT pulses 1/4 bilaterally Capillary reflex immediate bilaterally  Neurological: Sensation to 10 g monofilament wire intact 0/5 right and 2/5 left Vibratory sensation reactive bilaterally Ankle reflex equal reactive bilaterally  Dermatological: No open skin lesions noted bilaterally Plantar callus fifth MPJ right The toenails are elongated, hypertrophic, discolored, deformed and tender to direct palpation  Musculoskeletal: Pes planus bilaterally Hammertoe second bilaterally      Assessment & Plan:   Assessment: Symptomatic onychomycoses 6-10 Diabetic peripheral neuropathy Hammertoe second bilaterally Loss of protective sensation  Plan: Debridement toenails 10 mechanical he N electronically without any bleeding  Obtain urtication for diabetic shoes from Dr. Baird Cancer for the indication of: Diabetic peripheral neuropathy Hammertoe deformity second, bilaterally Loss of protective sensation  Rescheduled 3 months for nail debridement and upon receipt of certification for measurement for diabetic shoes

## 2015-08-05 ENCOUNTER — Other Ambulatory Visit: Payer: Self-pay | Admitting: Cardiovascular Disease

## 2015-08-08 NOTE — Telephone Encounter (Signed)
Rx request sent to pharmacy.  

## 2015-08-08 NOTE — Telephone Encounter (Signed)
°*  STAT* If patient is at the pharmacy, call can be transferred to refill team.   1. Which medications need to be refilled? (please list name of each medication and dose if known) Metolazone,Hydralazine and Atorvastatin  2. Which pharmacy/location (including street and city if local pharmacy) is medication to be sent to?[facility]  3. Do they need a 30 day or 90 day supply? 45 for Metolazone 90 for the other two and refills

## 2015-08-29 ENCOUNTER — Encounter: Payer: Self-pay | Admitting: Cardiovascular Disease

## 2015-09-07 ENCOUNTER — Ambulatory Visit: Payer: Medicare Other

## 2015-09-09 ENCOUNTER — Ambulatory Visit (INDEPENDENT_AMBULATORY_CARE_PROVIDER_SITE_OTHER): Payer: Medicare Other | Admitting: Podiatry

## 2015-09-09 DIAGNOSIS — M2042 Other hammer toe(s) (acquired), left foot: Secondary | ICD-10-CM

## 2015-09-09 DIAGNOSIS — B351 Tinea unguium: Secondary | ICD-10-CM | POA: Diagnosis not present

## 2015-09-09 DIAGNOSIS — Q828 Other specified congenital malformations of skin: Secondary | ICD-10-CM

## 2015-09-09 DIAGNOSIS — L84 Corns and callosities: Secondary | ICD-10-CM | POA: Diagnosis not present

## 2015-09-09 DIAGNOSIS — M2041 Other hammer toe(s) (acquired), right foot: Secondary | ICD-10-CM

## 2015-09-09 DIAGNOSIS — M79606 Pain in leg, unspecified: Secondary | ICD-10-CM

## 2015-09-09 DIAGNOSIS — E1140 Type 2 diabetes mellitus with diabetic neuropathy, unspecified: Secondary | ICD-10-CM | POA: Diagnosis not present

## 2015-09-09 NOTE — Patient Instructions (Signed)

## 2015-09-09 NOTE — Progress Notes (Signed)
Subjective:     Patient ID: Maria [name], female   DOB: 13-Nov-1925, 81 y.o.   MRN: [id number]  HPI patient presents to pickup diabetic shoes and also presents with nail disease 1-5 both feet with thick yellow brittle debris and also is noted to have keratotic lesion fifth digit right that's painful when pressed   Review of Systems     Objective:   Physical Exam  neurovascular status diminished but intact from previous visit with thick yellow brittle nailbeds 1-5 both feet that are painful lesion fifth digit right and history of pre-ulcerated of type callus formation    Assessment:      mycotic nail infection with lesion formation and structural deficit with long-term at risk diabetes    Plan:      diabetic shoes were dispensed to patient with instructions on usage and debrided lesion right and debrided nailbeds 1-5 both feet with no iatrogenic bleeding noted

## 2015-09-09 NOTE — Progress Notes (Signed)
Patient ID: Maria [name], female   DOB: 1926-02-22, 81 y.o.   MRN: [id number] Patient presents for diabetic shoe pick up, shoes are tried on for good fit.  Patient received 1 Pair Apex A832W Linda taupe in women's size 10 extra wide and 3 pairs custom molded diabetic inserts.  Verbal and written break in and wear instructions given.  Patient will follow up for scheduled routine care.

## 2015-09-15 ENCOUNTER — Other Ambulatory Visit: Payer: Self-pay | Admitting: Cardiovascular Disease

## 2015-09-15 NOTE — Telephone Encounter (Signed)
REFILL 

## 2015-09-20 ENCOUNTER — Ambulatory Visit: Payer: Medicare Other | Admitting: Podiatry

## 2015-10-04 ENCOUNTER — Ambulatory Visit (INDEPENDENT_AMBULATORY_CARE_PROVIDER_SITE_OTHER): Payer: Medicare PPO | Admitting: Cardiovascular Disease

## 2015-10-04 ENCOUNTER — Ambulatory Visit: Payer: Medicare Other | Admitting: Cardiovascular Disease

## 2015-10-04 ENCOUNTER — Encounter: Payer: Self-pay | Admitting: Cardiovascular Disease

## 2015-10-04 VITALS — BP 152/58 | HR 72 | Ht 62.0 in | Wt 198.0 lb

## 2015-10-04 DIAGNOSIS — I10 Essential (primary) hypertension: Secondary | ICD-10-CM | POA: Diagnosis not present

## 2015-10-04 DIAGNOSIS — R079 Chest pain, unspecified: Secondary | ICD-10-CM

## 2015-10-04 DIAGNOSIS — E785 Hyperlipidemia, unspecified: Secondary | ICD-10-CM

## 2015-10-04 DIAGNOSIS — R600 Localized edema: Secondary | ICD-10-CM | POA: Diagnosis not present

## 2015-10-04 NOTE — Patient Instructions (Signed)

## 2015-10-04 NOTE — Assessment & Plan Note (Signed)
History of hyperlipidemia on atorvastatin followed by her PCP 

## 2015-10-04 NOTE — Assessment & Plan Note (Signed)
History of atypical chest pain with a clean coronary cath in 2012 by myself and a nonischemic Myoview in July of last year.

## 2015-10-04 NOTE — Progress Notes (Signed)
10/04/2015 [name]   Jul 03, 1926  [id number]  Primary Physician Maximino Greenland, MD Primary Cardiologist: Lorretta Harp MD Maria [name]   HPI:  The patient is an 82 year old, moderately overweight, widowed Caucasian female, mother of 2, grandmother to 2 grandchildren who I last saw 6 months ago. Her problems include hypertension, hyperlipidemia, diabetes and obstructive sleep apnea on CPAP. She has normal coronary arteries and normal LV function by cath performed/29/12. She does have diastolic dysfunction. When I saw her back in February she was complaining of dyspnea and I did begin her on Zaroxolyn twice a week which resulted in some improvement in her symptoms as well as in her lower extremity edema, though, today she clearly admits to dietary indiscretion with regards to salt. She was admitted to [facility] for 4 days last month because of diverticulitis. Since her last office visit she continues to have dietary indiscretion related to salt with significant lower extremity edema and shortness of breath as well as palpitations. She does complain of occasional episodes of substernal chest pain but she has normal coronary arteries by cath which I performed 11/08/2010. Her edema is improved today. She continues to complain of chest pain with left upper extremity radiation and dyspnea on exertion. She walks with a walker and is minimally ambulatory.   Current Outpatient Prescriptions  Medication Sig Dispense Refill  . amLODipine (NORVASC) 5 MG tablet TAKE ONE-HALF (1/2) TABLET (2.5 MG TOTAL) EVERY MORNING 45 tablet 0  . aspirin 325 MG EC tablet Take 325 mg by mouth at bedtime.     Marland Kitchen atorvastatin (LIPITOR) 40 MG tablet TAKE 1 TABLET DAILY 90 tablet 1  . Cholecalciferol (VITAMIN D) 2000 UNITS tablet Take 2,000 Units by mouth daily.    . citalopram (CELEXA) 20 MG tablet Take 1 tablet by mouth daily.    Marland Kitchen docusate sodium (COLACE) 50 MG capsule Take 100 mg by mouth daily.       Marland Kitchen donepezil (ARICEPT) 10 MG tablet Take 10 mg by mouth daily.    Marland Kitchen ezetimibe (ZETIA) 10 MG tablet Take 1 tablet (10 mg total) by mouth at bedtime. 90 tablet 3  . folic acid-pyridoxine-cyancobalamin (FOLBIC) 2.5-25-2 MG TABS Take 1 tablet by mouth daily. 90 each 3  . furosemide (LASIX) 40 MG tablet TAKE 1 TABLET TWICE A DAY 180 tablet 0  . hydrALAZINE (APRESOLINE) 25 MG tablet TAKE 1 TABLET THREE TIMES A DAY 270 tablet 1  . isosorbide mononitrate (IMDUR) 30 MG 24 hr tablet Take 1 tablet (30 mg total) by mouth daily. 90 tablet 3  . KLOR-CON M10 10 MEQ tablet TAKE 1 TABLET TWICE A DAY 180 tablet 0  . LANTUS SOLOSTAR 100 UNIT/ML Solostar Pen Inject 14 Units into the skin every evening.    . loratadine (CLARITIN) 10 MG tablet Take 10 mg by mouth every morning.    . metolazone (ZAROXOLYN) 2.5 MG tablet Take 1 tablet (2.5 mg total) by mouth every other day. (Patient taking differently: Take 2.5 mg by mouth 2 (two) times a week. ) 45 tablet 3  . telmisartan (MICARDIS) 80 MG tablet TAKE 1 TABLET DAILY 90 tablet 0  . [DISCONTINUED] clonazePAM (KLONOPIN) 0.5 MG tablet Take 0.5 mg by mouth at bedtime.     No current facility-administered medications for this visit.    Allergies  Allergen Reactions  . Ace Inhibitors Other (See Comments)    Angioedema   . Diovan [Valsartan]     Social History   Social  History  . Marital Status: Widowed    Spouse Name: N/A  . Number of Children: N/A  . Years of Education: N/A   Occupational History  . Not on file.   Social History Main Topics  . Smoking status: Never Smoker   . Smokeless tobacco: Never Used  . Alcohol Use: No  . Drug Use: No  . Sexual Activity: No   Other Topics Concern  . Not on file   Social History Narrative     Review of Systems: General: negative for chills, fever, night sweats or weight changes.  Cardiovascular: negative for chest pain, dyspnea on exertion, edema, orthopnea, palpitations, paroxysmal nocturnal dyspnea or  shortness of breath Dermatological: negative for rash Respiratory: negative for cough or wheezing Urologic: negative for hematuria Abdominal: negative for nausea, vomiting, diarrhea, bright red blood per rectum, melena, or hematemesis Neurologic: negative for visual changes, syncope, or dizziness All other systems reviewed and are otherwise negative except as noted above.    Blood pressure 152/58, pulse 72, height 5\' 2"  (1.575 m), weight 198 lb (89.812 kg).  General appearance: alert and no distress Neck: no adenopathy, no carotid bruit, no JVD, supple, symmetrical, trachea midline and thyroid not enlarged, symmetric, no tenderness/mass/nodules Lungs: clear to auscultation bilaterally Heart: regular rate and rhythm, S1, S2 normal, no murmur, click, rub or gallop Extremities: extremities normal, atraumatic, no cyanosis or edema  EKG not performed today  ASSESSMENT AND PLAN:   Hypertension History of hypertension blood pressure measured today at 152/58. She is on amlodipine, hydralazine and Micardis. Continue current meds at current dosing  Bilateral lower extremity edema History of bilateral lower extremity edema on furosemide and Zaroxolyn. This is under good control. We talked about salt restriction.  Hyperlipidemia History of hyperlipidemia on atorvastatin followed by her PCP  Chest pain History of atypical chest pain with a clean coronary cath in 2012 by myself and a nonischemic Myoview in July of last year.      Lorretta Harp MD FACP,FACC,FAHA, [facility] 10/04/2015 10:59 AM

## 2015-10-04 NOTE — Assessment & Plan Note (Signed)
History of bilateral lower extremity edema on furosemide and Zaroxolyn. This is under good control. We talked about salt restriction.

## 2015-10-04 NOTE — Assessment & Plan Note (Signed)
History of hypertension blood pressure measured today at 152/58. She is on amlodipine, hydralazine and Micardis. Continue current meds at current dosing

## 2015-11-15 ENCOUNTER — Ambulatory Visit (INDEPENDENT_AMBULATORY_CARE_PROVIDER_SITE_OTHER): Payer: Medicare PPO | Admitting: Podiatry

## 2015-11-15 ENCOUNTER — Encounter: Payer: Self-pay | Admitting: Podiatry

## 2015-11-15 DIAGNOSIS — B351 Tinea unguium: Secondary | ICD-10-CM

## 2015-11-15 DIAGNOSIS — M79676 Pain in unspecified toe(s): Secondary | ICD-10-CM | POA: Diagnosis not present

## 2015-11-15 DIAGNOSIS — L84 Corns and callosities: Secondary | ICD-10-CM

## 2015-11-15 DIAGNOSIS — E1140 Type 2 diabetes mellitus with diabetic neuropathy, unspecified: Secondary | ICD-10-CM | POA: Diagnosis not present

## 2015-11-15 NOTE — Patient Instructions (Signed)
Removed Band-Aid on fourth right toe 1-3 days and continue to apply topical antibiotic ointment to that area with a Band-Aid until a scab forms  Diabetes and Foot Care Diabetes may cause you to have problems because of poor blood supply (circulation) to your feet and legs. This may cause the skin on your feet to become thinner, break easier, and heal more slowly. Your skin may become dry, and the skin may peel and crack. You may also have nerve damage in your legs and feet causing decreased feeling in them. You may not notice minor injuries to your feet that could lead to infections or more serious problems. Taking care of your feet is one of the most important things you can do for yourself.  HOME CARE INSTRUCTIONS  Wear shoes at all times, even in the house. Do not go barefoot. Bare feet are easily injured.  Check your feet daily for blisters, cuts, and redness. If you cannot see the bottom of your feet, use a mirror or ask someone for help.  Wash your feet with warm water (do not use hot water) and mild soap. Then pat your feet and the areas between your toes until they are completely dry. Do not soak your feet as this can dry your skin.  Apply a moisturizing lotion or petroleum jelly (that does not contain alcohol and is unscented) to the skin on your feet and to dry, brittle toenails. Do not apply lotion between your toes.  Trim your toenails straight across. Do not dig under them or around the cuticle. File the edges of your nails with an emery board or nail file.  Do not cut corns or calluses or try to remove them with medicine.  Wear clean socks or stockings every day. Make sure they are not too tight. Do not wear knee-high stockings since they may decrease blood flow to your legs.  Wear shoes that fit properly and have enough cushioning. To break in new shoes, wear them for just a few hours a day. This prevents you from injuring your feet. Always look in your shoes before you put them  on to be sure there are no objects inside.  Do not cross your legs. This may decrease the blood flow to your feet.  If you find a minor scrape, cut, or break in the skin on your feet, keep it and the skin around it clean and dry. These areas may be cleansed with mild soap and water. Do not cleanse the area with peroxide, alcohol, or iodine.  When you remove an adhesive bandage, be sure not to damage the skin around it.  If you have a wound, look at it several times a day to make sure it is healing.  Do not use heating pads or hot water bottles. They may burn your skin. If you have lost feeling in your feet or legs, you may not know it is happening until it is too late.  Make sure your health care provider performs a complete foot exam at least annually or more often if you have foot problems. Report any cuts, sores, or bruises to your health care provider immediately. SEEK MEDICAL CARE IF:   You have an injury that is not healing.  You have cuts or breaks in the skin.  You have an ingrown nail.  You notice redness on your legs or feet.  You feel burning or tingling in your legs or feet.  You have pain or cramps in your legs  and feet.  Your legs or feet are numb.  Your feet always feel cold. SEEK IMMEDIATE MEDICAL CARE IF:   There is increasing redness, swelling, or pain in or around a wound.  There is a red line that goes up your leg.  Pus is coming from a wound.  You develop a fever or as directed by your health care provider.  You notice a bad smell coming from an ulcer or wound.   This information is not intended to replace advice given to you by your health care provider. Make sure you discuss any questions you have with your health care provider.   Document Released: 08/24/2000 Document Revised: 04/29/2013 Document Reviewed: 02/03/2013 Elsevier Interactive Patient Education [employer].

## 2015-11-15 NOTE — Progress Notes (Signed)
Patient ID: Maria [name], female   DOB: [birth year], 82 y.o.   MRN: [id number]  Subjective: This patient presents for a scheduled visit complaining of thickened and elongated toenails which are cough walking wearing shoes and request toenail debridement. Also, patient complaining of painful corn and fifth right toe  Patient type II diabetic denies any history of claudication, ulceration or amputation  Objective: Orientated 3  Vascular: DP pulses 2/4 right 1/4 left PT pulses trace palpable bilaterally Capillary reflex immediate bilaterally  Neurological: Sensation to 10 g monofilament wire intact 1/5 right 2/5 left Vibratory sensation reactive bilaterally Ankle reflex equal and reactive bilaterally  Dermatological: No skin lesions bilaterally Corn fifth right toe The toenails elongated, brittle, hypertrophic, discolored and tender to palpation 6-10  Assessment: Pes planus bilaterally Hammertoe second bilaterally  Assessment: Decrease pedal pulses Symptomatic onychomycoses 6-10 Diabetic peripheral neuropathy Corn 1  Plan: Debridement toenails 6-10 mechanically and electrically. Slight leading distal fourth right toe treated with antibiotic ointment and Band-Aid. Patient advised removed Band-Aid on fourth right toe 1-3 days and continue to apply topical antibiotic ointment and Band-Aid until a scab forms Debride corn fifth right toe without any bleeding  Reappoint 3 months

## 2015-11-24 ENCOUNTER — Other Ambulatory Visit: Payer: Self-pay | Admitting: Cardiovascular Disease

## 2015-11-24 NOTE — Telephone Encounter (Signed)
Rx request sent to pharmacy.  

## 2016-01-10 ENCOUNTER — Ambulatory Visit (INDEPENDENT_AMBULATORY_CARE_PROVIDER_SITE_OTHER): Payer: Medicare PPO | Admitting: Ophthalmology

## 2016-01-10 DIAGNOSIS — H43813 Vitreous degeneration, bilateral: Secondary | ICD-10-CM | POA: Diagnosis not present

## 2016-01-10 DIAGNOSIS — H35033 Hypertensive retinopathy, bilateral: Secondary | ICD-10-CM

## 2016-01-10 DIAGNOSIS — E11319 Type 2 diabetes mellitus with unspecified diabetic retinopathy without macular edema: Secondary | ICD-10-CM | POA: Diagnosis not present

## 2016-01-10 DIAGNOSIS — E113293 Type 2 diabetes mellitus with mild nonproliferative diabetic retinopathy without macular edema, bilateral: Secondary | ICD-10-CM | POA: Diagnosis not present

## 2016-01-10 DIAGNOSIS — I10 Essential (primary) hypertension: Secondary | ICD-10-CM

## 2016-01-10 DIAGNOSIS — H353111 Nonexudative age-related macular degeneration, right eye, early dry stage: Secondary | ICD-10-CM

## 2016-02-21 ENCOUNTER — Ambulatory Visit (INDEPENDENT_AMBULATORY_CARE_PROVIDER_SITE_OTHER): Payer: Medicare PPO | Admitting: Podiatry

## 2016-02-21 DIAGNOSIS — L84 Corns and callosities: Secondary | ICD-10-CM

## 2016-02-21 DIAGNOSIS — E1140 Type 2 diabetes mellitus with diabetic neuropathy, unspecified: Secondary | ICD-10-CM | POA: Diagnosis not present

## 2016-02-21 DIAGNOSIS — B351 Tinea unguium: Secondary | ICD-10-CM | POA: Diagnosis not present

## 2016-02-21 DIAGNOSIS — M79676 Pain in unspecified toe(s): Secondary | ICD-10-CM | POA: Diagnosis not present

## 2016-02-21 NOTE — Progress Notes (Signed)
Patient ID: Maria [name], female   DOB: [birth year], 82 y.o.   MRN: [id number]  Subjective: This patient presents for a scheduled visit complaining of thickened and elongated toenails which are cough walking wearing shoes and request toenail debridement. Also, patient complaining of painful corn and fifth right toe  Patient type II diabetic denies any history of claudication, ulceration or amputation  Objective: Orientated 3  Vascular: DP pulses 2/4 right 1/4 left PT pulses trace palpable bilaterally Capillary reflex immediate bilaterally  Neurological: Sensation to 10 g monofilament wire intact 1/5 right 2/5 left Vibratory sensation reactive bilaterally Ankle reflex equal and reactive bilaterally  Dermatological: No skin lesions bilaterally Corn fifth right toe The toenails elongated, brittle, hypertrophic, discolored and tender to palpation 6-10  Assessment: Pes planus bilaterally Hammertoe second bilaterally  Assessment: Decrease pedal pulses Symptomatic onychomycoses 6-10 Diabetic peripheral neuropathy Corn 1  Plan: Debridement toenails 6-10 mechanically and electrically without any bleeding  Reappoint 3 months

## 2016-02-21 NOTE — Patient Instructions (Signed)
Diabetes and Foot Care Diabetes may cause you to have problems because of poor blood supply (circulation) to your feet and legs. This may cause the skin on your feet to become thinner, break easier, and heal more slowly. Your skin may become dry, and the skin may peel and crack. You may also have nerve damage in your legs and feet causing decreased feeling in them. You may not notice minor injuries to your feet that could lead to infections or more serious problems. Taking care of your feet is one of the most important things you can do for yourself.  HOME CARE INSTRUCTIONS  Wear shoes at all times, even in the house. Do not go barefoot. Bare feet are easily injured.  Check your feet daily for blisters, cuts, and redness. If you cannot see the bottom of your feet, use a mirror or ask someone for help.  Wash your feet with warm water (do not use hot water) and mild soap. Then pat your feet and the areas between your toes until they are completely dry. Do not soak your feet as this can dry your skin.  Apply a moisturizing lotion or petroleum jelly (that does not contain alcohol and is unscented) to the skin on your feet and to dry, brittle toenails. Do not apply lotion between your toes.  Trim your toenails straight across. Do not dig under them or around the cuticle. File the edges of your nails with an emery board or nail file.  Do not cut corns or calluses or try to remove them with medicine.  Wear clean socks or stockings every day. Make sure they are not too tight. Do not wear knee-high stockings since they may decrease blood flow to your legs.  Wear shoes that fit properly and have enough cushioning. To break in new shoes, wear them for just a few hours a day. This prevents you from injuring your feet. Always look in your shoes before you put them on to be sure there are no objects inside.  Do not cross your legs. This may decrease the blood flow to your feet.  If you find a minor scrape,  cut, or break in the skin on your feet, keep it and the skin around it clean and dry. These areas may be cleansed with mild soap and water. Do not cleanse the area with peroxide, alcohol, or iodine.  When you remove an adhesive bandage, be sure not to damage the skin around it.  If you have a wound, look at it several times a day to make sure it is healing.  Do not use heating pads or hot water bottles. They may burn your skin. If you have lost feeling in your feet or legs, you may not know it is happening until it is too late.  Make sure your health care provider performs a complete foot exam at least annually or more often if you have foot problems. Report any cuts, sores, or bruises to your health care provider immediately. SEEK MEDICAL CARE IF:   You have an injury that is not healing.  You have cuts or breaks in the skin.  You have an ingrown nail.  You notice redness on your legs or feet.  You feel burning or tingling in your legs or feet.  You have pain or cramps in your legs and feet.  Your legs or feet are numb.  Your feet always feel cold. SEEK IMMEDIATE MEDICAL CARE IF:   There is increasing redness,   swelling, or pain in or around a wound.  There is a red line that goes up your leg.  Pus is coming from a wound.  You develop a fever or as directed by your health care provider.  You notice a bad smell coming from an ulcer or wound.   This information is not intended to replace advice given to you by your health care provider. Make sure you discuss any questions you have with your health care provider.   Document Released: 08/24/2000 Document Revised: 04/29/2013 Document Reviewed: 02/03/2013 Elsevier Interactive Patient Education 2016 Elsevier Inc.  

## 2016-02-22 ENCOUNTER — Other Ambulatory Visit: Payer: Self-pay | Admitting: Cardiovascular Disease

## 2016-02-22 NOTE — Telephone Encounter (Signed)
Rx request sent to pharmacy.  

## 2016-04-06 ENCOUNTER — Other Ambulatory Visit: Payer: Self-pay | Admitting: *Deleted

## 2016-04-06 MED ORDER — METOLAZONE 2.5 MG PO TABS: 2.5000 mg | ORAL_TABLET | ORAL | 1 refills | Status: DC

## 2016-04-06 MED ORDER — ISOSORBIDE MONONITRATE ER 30 MG PO TB24: 30.0000 mg | ORAL_TABLET | Freq: Every day | ORAL | 1 refills | Status: DC

## 2016-04-23 ENCOUNTER — Other Ambulatory Visit: Payer: Self-pay | Admitting: *Deleted

## 2016-04-23 MED ORDER — HYDRALAZINE HCL 25 MG PO TABS: 25.0000 mg | ORAL_TABLET | Freq: Three times a day (TID) | ORAL | 0 refills | Status: DC

## 2016-05-21 ENCOUNTER — Other Ambulatory Visit: Payer: Self-pay

## 2016-05-21 MED ORDER — FA-PYRIDOXINE-CYANOCOBALAMIN 2.5-25-2 MG PO TABS: 1.0000 | ORAL_TABLET | Freq: Every day | ORAL | 3 refills | Status: DC

## 2016-05-21 MED ORDER — HYDRALAZINE HCL 25 MG PO TABS: 25.0000 mg | ORAL_TABLET | Freq: Three times a day (TID) | ORAL | 3 refills | Status: DC

## 2016-05-29 ENCOUNTER — Ambulatory Visit (INDEPENDENT_AMBULATORY_CARE_PROVIDER_SITE_OTHER): Payer: Medicare PPO | Admitting: Podiatry

## 2016-05-29 ENCOUNTER — Encounter: Payer: Self-pay | Admitting: Podiatry

## 2016-05-29 VITALS — BP 156/75 | HR 61 | Resp 14

## 2016-05-29 DIAGNOSIS — B351 Tinea unguium: Secondary | ICD-10-CM

## 2016-05-29 DIAGNOSIS — M79676 Pain in unspecified toe(s): Secondary | ICD-10-CM

## 2016-05-29 DIAGNOSIS — E1140 Type 2 diabetes mellitus with diabetic neuropathy, unspecified: Secondary | ICD-10-CM

## 2016-05-29 DIAGNOSIS — L84 Corns and callosities: Secondary | ICD-10-CM

## 2016-05-29 NOTE — Patient Instructions (Signed)
Diabetes and Foot Care Diabetes may cause you to have problems because of poor blood supply (circulation) to your feet and legs. This may cause the skin on your feet to become thinner, break easier, and heal more slowly. Your skin may become dry, and the skin may peel and crack. You may also have nerve damage in your legs and feet causing decreased feeling in them. You may not notice minor injuries to your feet that could lead to infections or more serious problems. Taking care of your feet is one of the most important things you can do for yourself.  HOME CARE INSTRUCTIONS  Wear shoes at all times, even in the house. Do not go barefoot. Bare feet are easily injured.  Check your feet daily for blisters, cuts, and redness. If you cannot see the bottom of your feet, use a mirror or ask someone for help.  Wash your feet with warm water (do not use hot water) and mild soap. Then pat your feet and the areas between your toes until they are completely dry. Do not soak your feet as this can dry your skin.  Apply a moisturizing lotion or petroleum jelly (that does not contain alcohol and is unscented) to the skin on your feet and to dry, brittle toenails. Do not apply lotion between your toes.  Trim your toenails straight across. Do not dig under them or around the cuticle. File the edges of your nails with an emery board or nail file.  Do not cut corns or calluses or try to remove them with medicine.  Wear clean socks or stockings every day. Make sure they are not too tight. Do not wear knee-high stockings since they may decrease blood flow to your legs.  Wear shoes that fit properly and have enough cushioning. To break in new shoes, wear them for just a few hours a day. This prevents you from injuring your feet. Always look in your shoes before you put them on to be sure there are no objects inside.  Do not cross your legs. This may decrease the blood flow to your feet.  If you find a minor scrape,  cut, or break in the skin on your feet, keep it and the skin around it clean and dry. These areas may be cleansed with mild soap and water. Do not cleanse the area with peroxide, alcohol, or iodine.  When you remove an adhesive bandage, be sure not to damage the skin around it.  If you have a wound, look at it several times a day to make sure it is healing.  Do not use heating pads or hot water bottles. They may burn your skin. If you have lost feeling in your feet or legs, you may not know it is happening until it is too late.  Make sure your health care provider performs a complete foot exam at least annually or more often if you have foot problems. Report any cuts, sores, or bruises to your health care provider immediately. SEEK MEDICAL CARE IF:   You have an injury that is not healing.  You have cuts or breaks in the skin.  You have an ingrown nail.  You notice redness on your legs or feet.  You feel burning or tingling in your legs or feet.  You have pain or cramps in your legs and feet.  Your legs or feet are numb.  Your feet always feel cold. SEEK IMMEDIATE MEDICAL CARE IF:   There is increasing redness,   swelling, or pain in or around a wound.  There is a red line that goes up your leg.  Pus is coming from a wound.  You develop a fever or as directed by your health care provider.  You notice a bad smell coming from an ulcer or wound.   This information is not intended to replace advice given to you by your health care provider. Make sure you discuss any questions you have with your health care provider.   Document Released: 08/24/2000 Document Revised: 04/29/2013 Document Reviewed: 02/03/2013 Elsevier Interactive Patient Education 2016 Elsevier Inc.  

## 2016-05-29 NOTE — Progress Notes (Signed)
Patient ID: Maria [name], female   DOB: 12-May-1926, 82 y.o.   MRN: [id number]    Subjective: This patient presents for a scheduled visit complaining of thickened and elongated toenails which are cough walking wearing shoes and request toenail debridement. Also, patient complaining of painful corn and fifth right toe  Patient type II diabetic denies any history of claudication, ulceration or amputation  Objective: Orientated 3  Vascular: DP pulses 2/4 right 1/4 left PT pulses trace palpable bilaterally Capillary reflex immediate bilaterally  Neurological: Sensation to 10 g monofilament wire intact 1/5 right 2/5 left Vibratory sensation reactive bilaterally Ankle reflex equal and reactive bilaterally  Dermatological: No skin lesions bilaterally Corn fifth right toe The toenails elongated, brittle, hypertrophic, discolored and tender to palpation 6-10  Musculoskeletal: Pes planus bilaterally Hammertoe second bilaterally  Assessment: Decrease pedal pulses Symptomatic onychomycoses 6-10 Diabetic peripheral neuropathy Corn 1  Plan: Debridement toenails 6-10 mechanically and electrically without any bleeding  Reappoint 3 months

## 2016-08-28 ENCOUNTER — Ambulatory Visit (INDEPENDENT_AMBULATORY_CARE_PROVIDER_SITE_OTHER): Payer: Medicare PPO | Admitting: Podiatry

## 2016-08-28 ENCOUNTER — Encounter: Payer: Self-pay | Admitting: Podiatry

## 2016-08-28 VITALS — BP 140/60 | HR 62 | Resp 14

## 2016-08-28 DIAGNOSIS — B351 Tinea unguium: Secondary | ICD-10-CM

## 2016-08-28 DIAGNOSIS — Q828 Other specified congenital malformations of skin: Secondary | ICD-10-CM

## 2016-08-28 DIAGNOSIS — E1140 Type 2 diabetes mellitus with diabetic neuropathy, unspecified: Secondary | ICD-10-CM

## 2016-08-28 DIAGNOSIS — M79676 Pain in unspecified toe(s): Secondary | ICD-10-CM

## 2016-08-28 DIAGNOSIS — L84 Corns and callosities: Secondary | ICD-10-CM

## 2016-08-28 NOTE — Patient Instructions (Signed)
There was slight bleeding after trimming the toenails on the fifth right toe which was treated with antibiotic ointment and Band-Aid. Removed Band-Aid and 1-3 days and continue to apply topical antibiotic ointment and Band-Aid daily until a scab forms  Diabetes and Foot Care Diabetes may cause you to have problems because of poor blood supply (circulation) to your feet and legs. This may cause the skin on your feet to become thinner, break easier, and heal more slowly. Your skin may become dry, and the skin may peel and crack. You may also have nerve damage in your legs and feet causing decreased feeling in them. You may not notice minor injuries to your feet that could lead to infections or more serious problems. Taking care of your feet is one of the most important things you can do for yourself. Follow these instructions at home:  Wear shoes at all times, even in the house. Do not go barefoot. Bare feet are easily injured.  Check your feet daily for blisters, cuts, and redness. If you cannot see the bottom of your feet, use a mirror or ask someone for help.  Wash your feet with warm water (do not use hot water) and mild soap. Then pat your feet and the areas between your toes until they are completely dry. Do not soak your feet as this can dry your skin.  Apply a moisturizing lotion or petroleum jelly (that does not contain alcohol and is unscented) to the skin on your feet and to dry, brittle toenails. Do not apply lotion between your toes.  Trim your toenails straight across. Do not dig under them or around the cuticle. File the edges of your nails with an emery board or nail file.  Do not cut corns or calluses or try to remove them with medicine.  Wear clean socks or stockings every day. Make sure they are not too tight. Do not wear knee-high stockings since they may decrease blood flow to your legs.  Wear shoes that fit properly and have enough cushioning. To break in new shoes, wear them  for just a few hours a day. This prevents you from injuring your feet. Always look in your shoes before you put them on to be sure there are no objects inside.  Do not cross your legs. This may decrease the blood flow to your feet.  If you find a minor scrape, cut, or break in the skin on your feet, keep it and the skin around it clean and dry. These areas may be cleansed with mild soap and water. Do not cleanse the area with peroxide, alcohol, or iodine.  When you remove an adhesive bandage, be sure not to damage the skin around it.  If you have a wound, look at it several times a day to make sure it is healing.  Do not use heating pads or hot water bottles. They may burn your skin. If you have lost feeling in your feet or legs, you may not know it is happening until it is too late.  Make sure your health care provider performs a complete foot exam at least annually or more often if you have foot problems. Report any cuts, sores, or bruises to your health care provider immediately. Contact a health care provider if:  You have an injury that is not healing.  You have cuts or breaks in the skin.  You have an ingrown nail.  You notice redness on your legs or feet.  You feel  burning or tingling in your legs or feet.  You have pain or cramps in your legs and feet.  Your legs or feet are numb.  Your feet always feel cold. Get help right away if:  There is increasing redness, swelling, or pain in or around a wound.  There is a red line that goes up your leg.  Pus is coming from a wound.  You develop a fever or as directed by your health care provider.  You notice a bad smell coming from an ulcer or wound. This information is not intended to replace advice given to you by your health care provider. Make sure you discuss any questions you have with your health care provider. Document Released: 08/24/2000 Document Revised: 02/02/2016 Document Reviewed: 02/03/2013 Elsevier  Interactive Patient Education  2017 [employer].

## 2016-08-28 NOTE — Progress Notes (Signed)
Patient ID: Maria [name], female   DOB: 1926/08/26, 82 y.o.   MRN: [id number]    Subjective: This patient presents for a scheduled visit complaining of thickened and elongated toenails which are cough walking wearing shoes and request toenail debridement. Also, patient complaining of painful corn and fifth right toe  Patient type II diabetic denies any history of claudication, ulceration or amputation  Objective: Orientated 3  Vascular: DP pulses 2/4 right 1/4 left PT pulses trace palpable bilaterally Capillary reflex immediate bilaterally  Neurological: Sensation to 10 g monofilament wire intact 1/5 right 2/5 left Vibratory sensation reactive bilaterally Ankle reflex equal and reactive bilaterally  Dermatological: No skin lesions bilaterally Corn fifth right toe The toenails elongated, brittle, hypertrophic, discolored and tender to palpation 6-10  Musculoskeletal: Pes planus bilaterally Hammertoe second bilaterally  Assessment: Decrease pedal pulses Symptomatic onychomycoses 6-10 Diabetic peripheral neuropathy Corn 1  Plan: Debridement toenails 6-10 mechanically and electrically with slight bleeding distal fifth right toenail. Treated with topical antibiotic ointment and Band-Aid. Patient advised to move Band-Aid 1-3 days and continue to apply topical antibiotic ointment and Band-Aid until a scab forms Debride corn 1 without any bleeding   Reappoint 3 months

## 2016-09-01 ENCOUNTER — Emergency Department (HOSPITAL_COMMUNITY): Payer: Medicare PPO

## 2016-09-01 ENCOUNTER — Other Ambulatory Visit (HOSPITAL_COMMUNITY): Payer: Self-pay | Admitting: Radiology

## 2016-09-01 ENCOUNTER — Emergency Department (HOSPITAL_COMMUNITY)
Admission: EM | Admit: 2016-09-01 | Discharge: 2016-09-01 | Disposition: A | Discharge status: Home / Self Care | Payer: Medicare PPO | Attending: Emergency Medicine | Admitting: Emergency Medicine

## 2016-09-01 ENCOUNTER — Encounter (HOSPITAL_COMMUNITY): Payer: Self-pay | Admitting: Emergency Medicine

## 2016-09-01 DIAGNOSIS — R42 Dizziness and giddiness: Secondary | ICD-10-CM | POA: Insufficient documentation

## 2016-09-01 DIAGNOSIS — Z7982 Long term (current) use of aspirin: Secondary | ICD-10-CM | POA: Insufficient documentation

## 2016-09-01 DIAGNOSIS — E119 Type 2 diabetes mellitus without complications: Secondary | ICD-10-CM | POA: Diagnosis not present

## 2016-09-01 DIAGNOSIS — I10 Essential (primary) hypertension: Secondary | ICD-10-CM | POA: Diagnosis not present

## 2016-09-01 DIAGNOSIS — Z79899 Other long term (current) drug therapy: Secondary | ICD-10-CM | POA: Insufficient documentation

## 2016-09-01 DIAGNOSIS — Z8673 Personal history of transient ischemic attack (TIA), and cerebral infarction without residual deficits: Secondary | ICD-10-CM | POA: Insufficient documentation

## 2016-09-01 LAB — CBC
HCT: 30.6 % — ABNORMAL LOW (ref 36.0–46.0)
Hemoglobin: 9.9 g/dL — ABNORMAL LOW (ref 12.0–15.0)
MCH: 27.7 pg (ref 26.0–34.0)
MCHC: 32.4 g/dL (ref 30.0–36.0)
MCV: 85.5 fL (ref 78.0–100.0)
Platelets: 190 10*3/uL (ref 150–400)
RBC: 3.58 MIL/uL — ABNORMAL LOW (ref 3.87–5.11)
RDW: 15.4 % (ref 11.5–15.5)
WBC: 7.8 10*3/uL (ref 4.0–10.5)

## 2016-09-01 LAB — URINALYSIS, ROUTINE W REFLEX MICROSCOPIC
Bilirubin Urine: NEGATIVE
Glucose, UA: NEGATIVE mg/dL
Hgb urine dipstick: NEGATIVE
Ketones, ur: NEGATIVE mg/dL
Leukocytes, UA: NEGATIVE
Nitrite: NEGATIVE
Protein, ur: NEGATIVE mg/dL
Specific Gravity, Urine: 1.013 (ref 1.005–1.030)
pH: 5 (ref 5.0–8.0)

## 2016-09-01 LAB — BASIC METABOLIC PANEL
Anion gap: 11 (ref 5–15)
BUN: 35 mg/dL — ABNORMAL HIGH (ref 6–20)
CO2: 28 mmol/L (ref 22–32)
Calcium: 9.1 mg/dL (ref 8.9–10.3)
Chloride: 99 mmol/L — ABNORMAL LOW (ref 101–111)
Creatinine, Ser: 1.52 mg/dL — ABNORMAL HIGH (ref 0.44–1.00)
GFR calc Af Amer: 34 mL/min — ABNORMAL LOW (ref >60)
GFR calc non Af Amer: 29 mL/min — ABNORMAL LOW (ref >60)
Glucose, Bld: 208 mg/dL — ABNORMAL HIGH (ref 65–99)
Potassium: 3.3 mmol/L — ABNORMAL LOW (ref 3.5–5.1)
Sodium: 138 mmol/L (ref 135–145)

## 2016-09-01 LAB — I-STAT CHEM 8, ED
BUN: 31 mg/dL — ABNORMAL HIGH (ref 6–20)
Calcium, Ion: 1.14 mmol/L — ABNORMAL LOW (ref 1.15–1.40)
Chloride: 100 mmol/L — ABNORMAL LOW (ref 101–111)
Creatinine, Ser: 1.6 mg/dL — ABNORMAL HIGH (ref 0.44–1.00)
Glucose, Bld: 143 mg/dL — ABNORMAL HIGH (ref 65–99)
HCT: 30 % — ABNORMAL LOW (ref 36.0–46.0)
Hemoglobin: 10.2 g/dL — ABNORMAL LOW (ref 12.0–15.0)
Potassium: 3.4 mmol/L — ABNORMAL LOW (ref 3.5–5.1)
Sodium: 140 mmol/L (ref 135–145)
TCO2: 27 mmol/L (ref 0–100)

## 2016-09-01 LAB — CBG MONITORING, ED: Glucose-Capillary: 177 mg/dL — ABNORMAL HIGH (ref 65–99)

## 2016-09-01 MED ORDER — SODIUM CHLORIDE 0.9 % IV BOLUS (SEPSIS)
500.0000 mL | Freq: Once | INTRAVENOUS | Status: AC
  Administered 2016-09-01: 500 mL via INTRAVENOUS

## 2016-09-01 MED ORDER — POTASSIUM CHLORIDE CRYS ER 20 MEQ PO TBCR
40.0000 meq | EXTENDED_RELEASE_TABLET | Freq: Once | ORAL | Status: AC
  Administered 2016-09-01: 40 meq via ORAL
  Filled 2016-09-01: qty 2

## 2016-09-01 NOTE — ED Provider Notes (Signed)
[facility] DEPT Provider Note   CSN: [id number] Arrival date & time: 09/01/16  1520     History   Chief Complaint Chief Complaint  Patient presents with  . Dizziness    HPI Maria [name] is a 82 y.o. female   Patient is an 89 -year-old female with past medical history of diabetes mellitus on insulin, TIAs, sleep apnea, vertigo presenting with dizziness that occurred earlier today just prior to arrival. Patient states that she was sitting on her couch when she suddenly felt the room spinning. She reports never feeling like this before. She said the episode lasted about 20-30 minutes. She hasn't taken anything for it. He reports still having dizziness, especially when changing positions. She also reports associated headache after the dizziness episode at her house and weakness.  Patient denies chest pain, shortness of breath, nausea, vomiting, ringing of the ears, no changes in vision, LOC, syncope, trauma, or focal neurological deficits.   The history is provided by the patient.  Dizziness  Associated symptoms: headaches and weakness   Associated symptoms: no chest pain, no diarrhea, no nausea, no palpitations, no shortness of breath and no vomiting     Past Medical History:  Diagnosis Date  . Anxiety   . Arthritis   . Blood transfusion   . Depression   . Diabetes mellitus   . Edema   . Heart murmur   . Hyperlipidemia   . Hypertension   . Sleep apnea    cpap  . Stroke ([place])    3 x tias  . TIA (transient ischemic attack)     Patient Active Problem List   Diagnosis Date Noted  . Chest pain 03/25/2015  . Acute diverticulitis 03/04/2013  . Bilateral lower extremity edema 01/23/2013  . Hyperlipidemia 01/23/2013  . Diverticulitis 01/31/2012  . Diabetes mellitus ([place]) 01/31/2012  . Hypertension 01/31/2012  . Obstructive sleep apnea on CPAP 01/31/2012  . History of TIAs 01/31/2012  . Obesity 01/31/2012  . H/O vertigo 01/31/2012    Past Surgical History:    Procedure Laterality Date  . ABDOMINAL HYSTERECTOMY    . APPENDECTOMY    . NM MYOVIEW LTD  [id number]   post stress left ventricle is normal in size, ejection fraction is 65%, normal myocardial perfusion study, low risk scan  . PARTIAL HYSTERECTOMY    . TRANSTHORACIC ECHOCARDIOGRAM  [id number]    OB History    No data available       Home Medications    Prior to Admission medications   Medication Sig Start Date End Date Taking? Authorizing Provider  amLODipine (NORVASC) 5 MG tablet TAKE ONE-HALF (1/2) TABLET (2.5 MG TOTAL) EVERY MORNING 11/24/15   Lorretta Harp, MD  aspirin 325 MG EC tablet Take 325 mg by mouth at bedtime.     Historical Provider, MD  atorvastatin (LIPITOR) 40 MG tablet TAKE 1 TABLET DAILY 02/22/16   Lorretta Harp, MD  Cholecalciferol (VITAMIN D) 2000 UNITS tablet Take 2,000 Units by mouth daily.    Historical Provider, MD  citalopram (CELEXA) 20 MG tablet Take 1 tablet by mouth daily. 09/25/14   Historical Provider, MD  docusate sodium (COLACE) 50 MG capsule Take 100 mg by mouth daily.     Historical Provider, MD  donepezil (ARICEPT) 10 MG tablet Take 10 mg by mouth daily.    Historical Provider, MD  ezetimibe (ZETIA) 10 MG tablet Take 1 tablet (10 mg total) by mouth at bedtime. 11/25/14   Lorretta Harp, MD  folic acid-pyridoxine-cyancobalamin (FOLBIC) 2.5-25-2 MG TABS tablet Take 1 tablet by mouth daily. 05/21/16   Lorretta Harp, MD  furosemide (LASIX) 40 MG tablet TAKE 1 TABLET TWICE A DAY 11/24/15   Lorretta Harp, MD  hydrALAZINE (APRESOLINE) 25 MG tablet Take 1 tablet (25 mg total) by mouth 3 (three) times daily. 05/21/16   Lorretta Harp, MD  isosorbide mononitrate (IMDUR) 30 MG 24 hr tablet Take 1 tablet (30 mg total) by mouth daily. 04/06/16   Lorretta Harp, MD  KLOR-CON M10 10 MEQ tablet TAKE 1 TABLET TWICE A DAY 11/24/15   Lorretta Harp, MD  LANTUS SOLOSTAR 100 UNIT/ML Solostar Pen Inject 14 Units into the skin every evening. 02/21/15   Historical  Provider, MD  loratadine (CLARITIN) 10 MG tablet Take 10 mg by mouth every morning.    Historical Provider, MD  metolazone (ZAROXOLYN) 2.5 MG tablet Take 1 tablet (2.5 mg total) by mouth every other day. 04/06/16   Lorretta Harp, MD  telmisartan (MICARDIS) 80 MG tablet TAKE 1 TABLET DAILY 11/24/15   Lorretta Harp, MD    Family History Family History  Problem Relation Age of Onset  . Prostate cancer      Social History Social History  Substance Use Topics  . Smoking status: Never Smoker  . Smokeless tobacco: Never Used  . Alcohol use No     Allergies   Ace inhibitors and Diovan [valsartan]   Review of Systems Review of Systems  Constitutional: Negative for chills and fever.  HENT: Negative for sore throat and trouble swallowing.   Eyes: Negative for pain and visual disturbance.  Respiratory: Negative for cough and shortness of breath.   Cardiovascular: Negative for chest pain and palpitations.  Gastrointestinal: Negative for constipation, diarrhea, nausea and vomiting.  Endocrine: Positive for polyuria.  Genitourinary: Negative for difficulty urinating, dysuria and hematuria.  Musculoskeletal: Negative for arthralgias and back pain.  Skin: Negative for color change and rash.  Neurological: Positive for dizziness, weakness and headaches. Negative for seizures, syncope and numbness.     Physical Exam Updated Vital Signs BP (!) 85/65 (BP Location: Left Wrist)   Pulse 66   Temp 98.5 F (36.9 C) (Oral)   Resp 16   SpO2 98%   Physical Exam  Constitutional: She is oriented to person, place, and time. She appears well-developed and well-nourished.  HENT:  Head: Normocephalic and atraumatic.  Nose: Nose normal.  Eyes: Conjunctivae and EOM are normal. Pupils are equal, round, and reactive to light.  Neck: Normal range of motion. Neck supple.  Cardiovascular: Normal rate.   Murmur heard.  Systolic murmur is present  Pulmonary/Chest: Effort normal and breath sounds  normal. No respiratory distress. She exhibits no tenderness.  Abdominal: Soft. Bowel sounds are normal. There is no tenderness. There is no rebound and no guarding.  Musculoskeletal: Normal range of motion.  Neurological: She is alert and oriented to person, place, and time. No sensory deficit.  Cranial Nerves:  III,IV, VI: ptosis not present, extra-ocular movements intact bilaterally, direct and consensual pupillary light reflexes intact bilaterally V: facial sensation, jaw opening, and bite strength equal bilaterally VII: eyebrow raise, eyelid close, smile, frown, pucker equal bilaterally VIII: hearing grossly normal bilaterally  IX,X: palate elevation and swallowing intact XI: bilateral shoulder shrug and lateral head rotation equal and strong XII: midline tongue extension  Negative pronator drift, negative RAM, negative finger-nose.  Patient did not feel safe standing or ambulating. Patient normally not able to  ambulate without walker.   Dix-Hallpike maneuver negative bilaterally for nystagmus.  Skin: Skin is warm. Capillary refill takes less than 2 seconds.  Psychiatric: She has a normal mood and affect. Her behavior is normal.  Nursing note and vitals reviewed.    ED Treatments / Results  Labs (all labs ordered are listed, but only abnormal results are displayed) Labs Reviewed  BASIC METABOLIC PANEL - Abnormal; Notable for the following:       Result Value   Potassium 3.3 (*)    Chloride 99 (*)    Glucose, Bld 208 (*)    BUN 35 (*)    Creatinine, Ser 1.52 (*)    GFR calc non Af Amer 29 (*)    GFR calc Af Amer 34 (*)    All other components within normal limits  CBC - Abnormal; Notable for the following:    RBC 3.58 (*)    Hemoglobin 9.9 (*)    HCT 30.6 (*)    All other components within normal limits  URINALYSIS, ROUTINE W REFLEX MICROSCOPIC - Abnormal; Notable for the following:    APPearance HAZY (*)    All other components within normal limits  CBG MONITORING,  ED - Abnormal; Notable for the following:    Glucose-Capillary 177 (*)    All other components within normal limits  I-STAT CHEM 8, ED - Abnormal; Notable for the following:    Potassium 3.4 (*)    Chloride 100 (*)    BUN 31 (*)    Creatinine, Ser 1.60 (*)    Glucose, Bld 143 (*)    Calcium, Ion 1.14 (*)    Hemoglobin 10.2 (*)    HCT 30.0 (*)    All other components within normal limits    EKG  EKG Interpretation  Date/Time:  Saturday September 01 2016 16:04:56 EST Ventricular Rate:  67 PR Interval:    QRS Duration: 89 QT Interval:  421 QTC Calculation: 445 R Axis:   69 Text Interpretation:  Sinus rhythm No significant change since last tracing Confirmed by ISAACS MD, CAMERON [phone]) on 09/01/2016 4:09:22 PM       Radiology Ct Head Wo Contrast  Result Date: 09/01/2016 CLINICAL DATA:  Dizziness. EXAM: CT HEAD WITHOUT CONTRAST TECHNIQUE: Contiguous axial images were obtained from the base of the skull through the vertex without intravenous contrast. COMPARISON:  05/31/2011 FINDINGS: Brain: No evidence of acute infarction, hemorrhage, hydrocephalus, extra-axial collection or mass lesion/mass effect. Moderate brain parenchymal volume loss and microangiopathic deep white matter changes, stable. Asymmetric prominence of the epidural space in the frontal and parietal convexities bilaterally may represent hygromas and is also stable. Vascular: Calcific atherosclerotic disease at the skullbase. Skull: Normal. Negative for fracture or focal lesion. Sinuses/Orbits: No acute finding. Other: None. IMPRESSION: No acute intracranial abnormality. Atrophy, chronic microvascular disease. Electronically Signed   By: Fidela Salisbury M.D.   On: 09/01/2016 17:46   Mr Brain Wo Contrast (neuro Protocol)  Result Date: 09/01/2016 CLINICAL DATA:  82 year old diabetic hypertensive female with dizziness. Subsequent encounter. EXAM: MRI HEAD WITHOUT CONTRAST TECHNIQUE: Multiplanar, multiecho pulse  sequences of the brain and surrounding structures were obtained without intravenous contrast. COMPARISON:  09/01/2016 head CT.  06/03/2011 brain MR. FINDINGS: Brain: No acute infarct or intracranial hemorrhage. Remote basal ganglia infarcts. Moderate chronic microvascular changes. 3 mm left periventricular structure better visualized on prior contrast-enhanced exam appears unchanged. Moderate global atrophy without hydrocephalus. Prominent ossification of the falx incidentally noted. Vascular: Major intracranial vascular structures are patent. Skull  and upper cervical spine: Transverse ligament hypertrophy with narrowing ventral thecal sac. Cervical spondylotic changes C4-5 with narrowing ventral thecal sac and minimal cord flattening. Partially empty sella incidentally noted. Sinuses/Orbits: Post lens replacement without acute orbital abnormality. Minimal partial opacification inferior mastoid air cells greater on the right. No obstructing lesion of eustachian tube noted. Minimal mucosal thickening ethmoid sinus air cells. Other: Negative. IMPRESSION: No acute infarct or intracranial hemorrhage. Remote basal ganglia infarcts. Moderate chronic microvascular changes. Moderate global atrophy without hydrocephalus. Transverse ligament hypertrophy with narrowing ventral thecal sac. Cervical spondylotic changes C4-5 with narrowing ventral thecal sac and minimal cord flattening. Electronically Signed   By: Genia Del M.D.   On: 09/01/2016 18:50    Procedures Procedures (including critical care time)  Medications Ordered in ED Medications  sodium chloride 0.9 % bolus 500 mL (0 mLs Intravenous Stopped 09/01/16 1958)  sodium chloride 0.9 % bolus 500 mL (500 mLs Intravenous New Bag/Given 09/01/16 1958)  potassium chloride SA (K-DUR,KLOR-CON) CR tablet 40 mEq (40 mEq Oral Given 09/01/16 1958)     Initial Impression / Assessment and Plan / ED Course  I have reviewed the triage vital signs and the nursing  notes.  Pertinent labs & imaging results that were available during my care of the patient were reviewed by me and considered in my medical decision making (see chart for details).  Clinical Course   Patient is an 82 year old female with dizziness just PTA. On exam patient afebrile, hemodynamically stable, in no apparent distress. Systolic murmur appreciated on exam. Heart sounds are clear. Abdomen soft and nontender. Neuro exam negative. Sensory intact. Motor strength intact bilaterally. Patient does have a history of right knee pain and arthritis as being treated by orthopedic. She is not able to stand or ambulate because of the pain at this moment. Supervising Physician Duffy Bruce, MD also saw and evaluated patient. CT of head and MR brain both negative for any acute infarct or intracranial hemorrhage. Lab work shows increasing creatinine to 1.52 and on repeat 1.60 from previous findings in 2014. Not sure if this is her baseline or if this is an acute process. Patient does get seen by cardiology and has been getting seen and going to office visits. Potassium, chloride, BUN slightly improved on repeat chemistry. Patient felt much better after given results and no longer dizzy. Shared decision making with Dr. Ellender Hose and myself stating that she feels well and would like to be home for Christmas. Patient given strict instructions to schedule an appointment for follow-up with her primary care physician for 2-5 days to have repeat lab work done. Return precautions given for any new or worsening symptoms such as numbness, weakness, chest pain, shortness of breath, focal neurological deficits, blurry vision, facial drooping.  Final Clinical Impressions(s) / ED Diagnoses   Final diagnoses:  Dizziness    New Prescriptions New Prescriptions   No medications on file     [place], [place] 09/01/16 2140    Duffy Bruce, MD 09/03/16 [phone]

## 2016-09-01 NOTE — ED Triage Notes (Signed)
Per EMS pt dizziness onset 1400 with housework. Pt denies pain and verbalize "was doing too much." Neurologically intact.

## 2016-09-01 NOTE — ED Notes (Signed)
Pt returned from MRI °

## 2016-09-01 NOTE — Discharge Instructions (Signed)
Please follow-up with your primary care physician in 2 -5 days.   Get help right away if: You vomit or have diarrhea and are unable to eat or drink anything. You have problems talking, walking, swallowing, or using your arms, hands, or legs. You feel generally weak. You are not thinking clearly or you have trouble forming sentences. It may take a friend or family member to notice this. You have chest pain, abdominal pain, shortness of breath, or sweating. Your vision changes. You notice any bleeding. You have a headache. You have neck pain or a stiff neck. You have a fever.

## 2016-09-01 NOTE — ED Notes (Signed)
Provider in room  

## 2016-09-01 NOTE — ED Notes (Signed)
Pt transported to CT ?

## 2016-09-01 NOTE — ED Notes (Signed)
Pt transported to MRI. Will administer bolus with pt return.

## 2016-09-01 NOTE — ED Notes (Signed)
Patient transported to MRI 

## 2016-09-01 NOTE — ED Notes (Signed)
Pt denies dizziness with assessing orthostatic VS.

## 2016-10-02 ENCOUNTER — Encounter (HOSPITAL_COMMUNITY): Payer: Self-pay

## 2016-10-02 ENCOUNTER — Emergency Department (HOSPITAL_COMMUNITY)
Admission: EM | Admit: 2016-10-02 | Discharge: 2016-10-02 | Disposition: A | Discharge status: Home / Self Care | Payer: Medicare PPO | Attending: Emergency Medicine | Admitting: Emergency Medicine

## 2016-10-02 DIAGNOSIS — E119 Type 2 diabetes mellitus without complications: Secondary | ICD-10-CM | POA: Insufficient documentation

## 2016-10-02 DIAGNOSIS — R55 Syncope and collapse: Secondary | ICD-10-CM | POA: Diagnosis not present

## 2016-10-02 DIAGNOSIS — Z8673 Personal history of transient ischemic attack (TIA), and cerebral infarction without residual deficits: Secondary | ICD-10-CM | POA: Diagnosis not present

## 2016-10-02 DIAGNOSIS — I10 Essential (primary) hypertension: Secondary | ICD-10-CM | POA: Insufficient documentation

## 2016-10-02 DIAGNOSIS — Z7982 Long term (current) use of aspirin: Secondary | ICD-10-CM | POA: Diagnosis not present

## 2016-10-02 LAB — I-STAT CHEM 8, ED
BUN: 39 mg/dL — ABNORMAL HIGH (ref 6–20)
Calcium, Ion: 1.13 mmol/L — ABNORMAL LOW (ref 1.15–1.40)
Chloride: 99 mmol/L — ABNORMAL LOW (ref 101–111)
Creatinine, Ser: 1.4 mg/dL — ABNORMAL HIGH (ref 0.44–1.00)
Glucose, Bld: 163 mg/dL — ABNORMAL HIGH (ref 65–99)
HCT: 32 % — ABNORMAL LOW (ref 36.0–46.0)
Hemoglobin: 10.9 g/dL — ABNORMAL LOW (ref 12.0–15.0)
Potassium: 3.8 mmol/L (ref 3.5–5.1)
Sodium: 139 mmol/L (ref 135–145)
TCO2: 26 mmol/L (ref 0–100)

## 2016-10-02 NOTE — ED Provider Notes (Signed)
[facility] DEPT Provider Note   CSN: [id number] Arrival date & time: 10/02/16  1242     History   Chief Complaint No chief complaint on file.   HPI Maria [name] is a 83 y.o. female.  The history is provided by the patient and a relative.  Near Syncope  This is a new problem. The current episode started 1 to 2 hours ago. The problem occurs constantly. The problem has been resolved. Pertinent negatives include no chest pain, no abdominal pain and no shortness of breath. Nothing aggravates the symptoms. Nothing relieves the symptoms. She has tried nothing for the symptoms.    Past Medical History:  Diagnosis Date  . Anxiety   . Arthritis   . Blood transfusion   . Depression   . Diabetes mellitus   . Edema   . Heart murmur   . Hyperlipidemia   . Hypertension   . Sleep apnea    cpap  . Stroke ([place])    3 x tias  . TIA (transient ischemic attack)     Patient Active Problem List   Diagnosis Date Noted  . Chest pain 03/25/2015  . Acute diverticulitis 03/04/2013  . Bilateral lower extremity edema 01/23/2013  . Hyperlipidemia 01/23/2013  . Diverticulitis 01/31/2012  . Diabetes mellitus ([place]) 01/31/2012  . Hypertension 01/31/2012  . Obstructive sleep apnea on CPAP 01/31/2012  . History of TIAs 01/31/2012  . Obesity 01/31/2012  . H/O vertigo 01/31/2012    Past Surgical History:  Procedure Laterality Date  . ABDOMINAL HYSTERECTOMY    . APPENDECTOMY    . NM MYOVIEW LTD  [id number]   post stress left ventricle is normal in size, ejection fraction is 65%, normal myocardial perfusion study, low risk scan  . PARTIAL HYSTERECTOMY    . TRANSTHORACIC ECHOCARDIOGRAM  [id number]    OB History    No data available       Home Medications    Prior to Admission medications   Medication Sig Start Date End Date Taking? Authorizing Provider  amLODipine (NORVASC) 5 MG tablet TAKE ONE-HALF (1/2) TABLET (2.5 MG TOTAL) EVERY MORNING 11/24/15  Yes Lorretta Harp, MD    donepezil (ARICEPT) 10 MG tablet Take 10 mg by mouth daily.   Yes Historical Provider, MD  LANTUS SOLOSTAR 100 UNIT/ML Solostar Pen Inject 14 Units into the skin every evening. 02/21/15  Yes Historical Provider, MD  aspirin 325 MG EC tablet Take 325 mg by mouth at bedtime.     Historical Provider, MD  atorvastatin (LIPITOR) 40 MG tablet TAKE 1 TABLET DAILY 02/22/16   Lorretta Harp, MD  Cholecalciferol (VITAMIN D) 2000 UNITS tablet Take 2,000 Units by mouth daily.    Historical Provider, MD  citalopram (CELEXA) 20 MG tablet Take 1 tablet by mouth daily. 09/25/14   Historical Provider, MD  docusate sodium (COLACE) 50 MG capsule Take 100 mg by mouth daily.     Historical Provider, MD  ezetimibe (ZETIA) 10 MG tablet Take 1 tablet (10 mg total) by mouth at bedtime. 11/25/14   Lorretta Harp, MD  folic acid-pyridoxine-cyancobalamin (FOLBIC) 2.5-25-2 MG TABS tablet Take 1 tablet by mouth daily. 05/21/16   Lorretta Harp, MD  furosemide (LASIX) 40 MG tablet TAKE 1 TABLET TWICE A DAY 11/24/15   Lorretta Harp, MD  hydrALAZINE (APRESOLINE) 25 MG tablet Take 1 tablet (25 mg total) by mouth 3 (three) times daily. 05/21/16   Lorretta Harp, MD  isosorbide mononitrate (IMDUR) 30 MG 24  hr tablet Take 1 tablet (30 mg total) by mouth daily. 04/06/16   Lorretta Harp, MD  KLOR-CON M10 10 MEQ tablet TAKE 1 TABLET TWICE A DAY 11/24/15   Lorretta Harp, MD  loratadine (CLARITIN) 10 MG tablet Take 10 mg by mouth every morning.    Historical Provider, MD  metolazone (ZAROXOLYN) 2.5 MG tablet Take 1 tablet (2.5 mg total) by mouth every other day. 04/06/16   Lorretta Harp, MD  telmisartan (MICARDIS) 80 MG tablet TAKE 1 TABLET DAILY 11/24/15   Lorretta Harp, MD    Family History Family History  Problem Relation Age of Onset  . Prostate cancer      Social History Social History  Substance Use Topics  . Smoking status: Never Smoker  . Smokeless tobacco: Never Used  . Alcohol use No     Allergies    Ace inhibitors and Diovan [valsartan]   Review of Systems Review of Systems  Respiratory: Negative for shortness of breath.   Cardiovascular: Positive for near-syncope. Negative for chest pain.  Gastrointestinal: Negative for abdominal pain.  All other systems reviewed and are negative.    Physical Exam Updated Vital Signs BP (!) 122/54 (BP Location: Left Arm)   Pulse 68   Temp 98.1 F (36.7 C) (Oral)   Resp 13   Ht 4\' 11"  (1.499 m)   Wt 180 lb (81.6 kg)   SpO2 99%   BMI 36.36 kg/m   Physical Exam  Constitutional: She is oriented to person, place, and time. She appears well-developed and well-nourished. No distress.  HENT:  Head: Normocephalic.  Nose: Nose normal.  Eyes: Conjunctivae are normal.  Neck: Neck supple. No tracheal deviation present.  Cardiovascular: Normal rate, regular rhythm and normal heart sounds.   Pulmonary/Chest: Effort normal and breath sounds normal. No respiratory distress.  Abdominal: Soft. She exhibits no distension.  Neurological: She is alert and oriented to person, place, and time. No cranial nerve deficit. Coordination normal.  Skin: Skin is warm and dry.  Psychiatric: She has a normal mood and affect. Her behavior is normal.  Vitals reviewed.    ED Treatments / Results  Labs (all labs ordered are listed, but only abnormal results are displayed) Labs Reviewed  I-STAT CHEM 8, ED - Abnormal; Notable for the following:       Result Value   Chloride 99 (*)    BUN 39 (*)    Creatinine, Ser 1.40 (*)    Glucose, Bld 163 (*)    Calcium, Ion 1.13 (*)    Hemoglobin 10.9 (*)    HCT 32.0 (*)    All other components within normal limits    EKG  EKG Interpretation  Date/Time:  Tuesday October 02 2016 13:47:58 EST Ventricular Rate:  64 PR Interval:    QRS Duration: 90 QT Interval:  420 QTC Calculation: 434 R Axis:   70 Text Interpretation:  Sinus rhythm Normal ECG No significant change since last tracing Confirmed by Yehia Mcbain MD, Oriel Rumbold  ([id number]) on 10/02/2016 2:05:54 PM       Radiology No results found.  Procedures Procedures (including critical care time)  Medications Ordered in ED Medications - No data to display   Initial Impression / Assessment and Plan / ED Course  I have reviewed the triage vital signs and the nursing notes.  Pertinent labs & imaging results that were available during my care of the patient were reviewed by me and considered in my medical decision making (see chart  for details).     83 y.o. female presents with emotional upset after the fourth time this week where she was not picked up by the [place] This caused a delay in her transport back home which was very distressing for her. She began to hyperventilate and feel faint. No LOC. Well appearing here. No significant hematologic or metabolic abnormalities to explain symptoms. Completely resolved and appears related to emotional state rather than physical ailment. Discussed results and plan with family. Plan to follow up with PCP as needed and return precautions discussed for worsening or new concerning symptoms.   Final Clinical Impressions(s) / ED Diagnoses   Final diagnoses:  Near syncope    New Prescriptions New Prescriptions   No medications on file     Leo Grosser, MD 10/02/16 2155

## 2016-10-03 ENCOUNTER — Other Ambulatory Visit: Payer: Self-pay | Admitting: Cardiovascular Disease

## 2016-10-03 NOTE — Telephone Encounter (Signed)
New Message    [facility] said they have not received these 2 prescriptions because Dr Gwenlyn Found number has been disconnected    [phone]     *STAT* If patient is at the pharmacy, call can be transferred to refill team.   1. Which medications need to be refilled? (please list name of each medication and dose if known) see below  2. Which pharmacy/location (including street and city if local pharmacy) is medication [facility] sent [facility]   3. Do they need a 30 day or 90 day supply? 90   hydrALAZINE (APRESOLINE) 25 MG tablet Take 1 tablet (25 mg total) by mouth 3 (three) times daily.    ezetimibe (ZETIA) 10 MG tablet Take 1 tablet (10 mg total) by mouth at bedtime. Patient not taking: Reported on 10/02/2016

## 2016-10-04 MED ORDER — EZETIMIBE 10 MG PO TABS: 10.0000 mg | ORAL_TABLET | Freq: Every day | ORAL | 0 refills | Status: DC

## 2016-10-04 MED ORDER — HYDRALAZINE HCL 25 MG PO TABS: 25.0000 mg | ORAL_TABLET | Freq: Three times a day (TID) | ORAL | 0 refills | Status: DC

## 2016-10-04 NOTE — Telephone Encounter (Signed)
Rx(s) sent to pharmacy electronically.  

## 2016-10-05 ENCOUNTER — Other Ambulatory Visit: Payer: Self-pay

## 2016-10-05 MED ORDER — EZETIMIBE 10 MG PO TABS: 10.0000 mg | ORAL_TABLET | Freq: Every day | ORAL | 0 refills | Status: DC

## 2016-10-11 ENCOUNTER — Other Ambulatory Visit: Payer: Self-pay | Admitting: Cardiovascular Disease

## 2016-10-24 ENCOUNTER — Telehealth: Payer: Self-pay | Admitting: *Deleted

## 2016-10-24 NOTE — Telephone Encounter (Signed)
Pt states she didn't get diabetic shoes last year and would like to start the process for this year.

## 2016-10-30 ENCOUNTER — Other Ambulatory Visit: Payer: Self-pay | Admitting: Cardiovascular Disease

## 2016-10-30 NOTE — Telephone Encounter (Signed)
Rx(s) sent to pharmacy electronically.  

## 2016-10-30 NOTE — Telephone Encounter (Signed)
Left message for patient that we will do exam and paperwork at next office visit with Dr Amalia Hailey.

## 2016-11-27 ENCOUNTER — Ambulatory Visit (INDEPENDENT_AMBULATORY_CARE_PROVIDER_SITE_OTHER): Payer: Medicare PPO | Admitting: Podiatry

## 2016-11-27 ENCOUNTER — Ambulatory Visit: Payer: Medicare PPO | Admitting: Podiatry

## 2016-11-27 ENCOUNTER — Encounter: Payer: Self-pay | Admitting: Podiatry

## 2016-11-27 DIAGNOSIS — B351 Tinea unguium: Secondary | ICD-10-CM

## 2016-11-27 DIAGNOSIS — M2041 Other hammer toe(s) (acquired), right foot: Secondary | ICD-10-CM

## 2016-11-27 DIAGNOSIS — E1140 Type 2 diabetes mellitus with diabetic neuropathy, unspecified: Secondary | ICD-10-CM | POA: Diagnosis not present

## 2016-11-27 DIAGNOSIS — M79676 Pain in unspecified toe(s): Secondary | ICD-10-CM | POA: Diagnosis not present

## 2016-11-27 DIAGNOSIS — Q828 Other specified congenital malformations of skin: Secondary | ICD-10-CM | POA: Diagnosis not present

## 2016-11-27 DIAGNOSIS — M2042 Other hammer toe(s) (acquired), left foot: Secondary | ICD-10-CM

## 2016-11-27 NOTE — Progress Notes (Signed)
She presents today chief complaint of painful elongated toenails and multiple calluses.  Objective: Vital signs are stable alert and oriented 3. Pulses are palpable. Neurologic sensorium is intact. Deep tendon reflexes are intact muscle strength is normal bilateral. Orthopedic evaluation of strokes all joints distal to the ankle full range of motion or crepitation. Cutaneous evaluation shows supple well-hydrated cutis toenails are thick L dystrophic mycotic mycotic multiple reactive hyperkeratotic lesions plantar aspect bilateral foot.  Assessment: Palen segment onychomycosis porokeratosis.  Plan: Debridement of all reactive hyperkeratoses in porokeratosis today follow-up with her on an as-needed basis or in 3 months to see Maria [name].

## 2017-01-09 ENCOUNTER — Ambulatory Visit (INDEPENDENT_AMBULATORY_CARE_PROVIDER_SITE_OTHER): Payer: Medicare PPO | Admitting: Ophthalmology

## 2017-01-09 DIAGNOSIS — H35033 Hypertensive retinopathy, bilateral: Secondary | ICD-10-CM

## 2017-01-09 DIAGNOSIS — H43813 Vitreous degeneration, bilateral: Secondary | ICD-10-CM | POA: Diagnosis not present

## 2017-01-09 DIAGNOSIS — E113293 Type 2 diabetes mellitus with mild nonproliferative diabetic retinopathy without macular edema, bilateral: Secondary | ICD-10-CM | POA: Diagnosis not present

## 2017-01-09 DIAGNOSIS — I10 Essential (primary) hypertension: Secondary | ICD-10-CM

## 2017-01-09 DIAGNOSIS — H353111 Nonexudative age-related macular degeneration, right eye, early dry stage: Secondary | ICD-10-CM

## 2017-01-09 DIAGNOSIS — E11319 Type 2 diabetes mellitus with unspecified diabetic retinopathy without macular edema: Secondary | ICD-10-CM | POA: Diagnosis not present

## 2017-01-23 ENCOUNTER — Other Ambulatory Visit: Payer: Self-pay | Admitting: Internal Medicine

## 2017-01-23 DIAGNOSIS — M810 Age-related osteoporosis without current pathological fracture: Secondary | ICD-10-CM

## 2017-01-24 ENCOUNTER — Other Ambulatory Visit: Payer: Self-pay | Admitting: Cardiovascular Disease

## 2017-01-25 ENCOUNTER — Other Ambulatory Visit: Payer: Self-pay | Admitting: *Deleted

## 2017-01-25 MED ORDER — EZETIMIBE 10 MG PO TABS: 10.0000 mg | ORAL_TABLET | Freq: Every day | ORAL | 0 refills | Status: DC

## 2017-01-25 MED ORDER — HYDRALAZINE HCL 25 MG PO TABS: 25.0000 mg | ORAL_TABLET | Freq: Three times a day (TID) | ORAL | 0 refills | Status: DC

## 2017-02-01 ENCOUNTER — Other Ambulatory Visit: Payer: Medicare PPO

## 2017-02-01 ENCOUNTER — Ambulatory Visit
Admission: RE | Admit: 2017-02-01 | Discharge: 2017-02-01 | Disposition: A | Discharge status: Home / Self Care | Payer: Medicare PPO | Source: Ambulatory Visit | Attending: Internal Medicine | Admitting: Internal Medicine

## 2017-02-01 DIAGNOSIS — M810 Age-related osteoporosis without current pathological fracture: Secondary | ICD-10-CM

## 2017-02-07 ENCOUNTER — Other Ambulatory Visit: Payer: Self-pay | Admitting: Cardiovascular Disease

## 2017-02-12 ENCOUNTER — Other Ambulatory Visit: Payer: Self-pay | Admitting: Cardiovascular Disease

## 2017-02-13 ENCOUNTER — Encounter: Payer: Self-pay | Admitting: Cardiovascular Disease

## 2017-02-13 ENCOUNTER — Ambulatory Visit (INDEPENDENT_AMBULATORY_CARE_PROVIDER_SITE_OTHER): Payer: Medicare PPO | Admitting: Cardiovascular Disease

## 2017-02-13 VITALS — BP 132/68 | HR 82 | Ht 63.0 in | Wt 184.4 lb

## 2017-02-13 DIAGNOSIS — R002 Palpitations: Secondary | ICD-10-CM | POA: Diagnosis not present

## 2017-02-13 MED ORDER — METOLAZONE 2.5 MG PO TABS: 2.5000 mg | ORAL_TABLET | ORAL | 3 refills | Status: DC

## 2017-02-13 MED ORDER — TELMISARTAN 80 MG PO TABS: ORAL_TABLET | ORAL | 3 refills | Status: DC

## 2017-02-13 MED ORDER — AMLODIPINE BESYLATE 5 MG PO TABS: ORAL_TABLET | ORAL | 3 refills | Status: DC

## 2017-02-13 MED ORDER — ATORVASTATIN CALCIUM 40 MG PO TABS: 40.0000 mg | ORAL_TABLET | Freq: Every day | ORAL | 3 refills | Status: DC

## 2017-02-13 MED ORDER — ISOSORBIDE MONONITRATE ER 30 MG PO TB24: 30.0000 mg | ORAL_TABLET | Freq: Every day | ORAL | 3 refills | Status: DC

## 2017-02-13 MED ORDER — HYDRALAZINE HCL 25 MG PO TABS: ORAL_TABLET | ORAL | 3 refills | Status: DC

## 2017-02-13 MED ORDER — POTASSIUM CHLORIDE CRYS ER 10 MEQ PO TBCR: 10.0000 meq | EXTENDED_RELEASE_TABLET | Freq: Two times a day (BID) | ORAL | 3 refills | Status: DC

## 2017-02-13 MED ORDER — EZETIMIBE 10 MG PO TABS: ORAL_TABLET | ORAL | 3 refills | Status: DC

## 2017-02-13 MED ORDER — FUROSEMIDE 40 MG PO TABS: ORAL_TABLET | ORAL | 3 refills | Status: DC

## 2017-02-13 NOTE — Assessment & Plan Note (Signed)
History of hyperlipidemia on statin therapy followed by her PCP. 

## 2017-02-13 NOTE — Assessment & Plan Note (Signed)
Recent onset of palpitations which has been noticeable. We will check a 2 week event monitor.

## 2017-02-13 NOTE — Patient Instructions (Signed)
Medication Instructions: Your physician recommends that you continue on your current medications as directed. Please refer to the Current Medication list given to you today.   Testing/Procedures: Your physician has recommended that you wear a 14 day event monitor. Event monitors are medical devices that record the heart's electrical activity. Doctors most often us these monitors to diagnose arrhythmias. Arrhythmias are problems with the speed or rhythm of the heartbeat. The monitor is a small, portable device. You can wear one while you do your normal daily activities. This is usually used to diagnose what is causing palpitations/syncope (passing out).   Follow-Up: Your physician wants you to follow-up in: 1 year with Dr. Berry. You will receive a reminder letter in the mail two months in advance. If you don't receive a letter, please call our office to schedule the follow-up appointment.  If you need a refill on your cardiac medications before your next appointment, please call your pharmacy.  

## 2017-02-13 NOTE — Assessment & Plan Note (Signed)
History of essential hypertension blood pressure measured today at 132/68. She is on amlodipine, hydralazine and Micardis . Current meds at current dosing

## 2017-02-13 NOTE — Progress Notes (Signed)
02/13/2017 Maria [name]   08/30/1926  [id number]  Primary Physician Glendale Chard, MD Primary Cardiologist: Lorretta Harp MD Renae Gloss  HPI:  The patient is an 83 year old, moderately overweight, widowed Caucasian female, mother of 2, grandmother to 2 grandchildren who I last saw in the office 10/04/15.Marland Kitchen Her problems include hypertension, hyperlipidemia, diabetes and obstructive sleep apnea on CPAP. She has normal coronary arteries and normal LV function by cath performed/29/12. She does have diastolic dysfunction. When I saw her back in February she was complaining of dyspnea and I did begin her on Zaroxolyn twice a week which resulted in some improvement in her symptoms as well as in her lower extremity edema, though, today she clearly admits to dietary indiscretion with regards to salt. She was admitted to [facility] for 4 days last month because of diverticulitis. Since her last office visit she continues to have dietary indiscretion related to salt with significant lower extremity edema and shortness of breath as well as palpitations. She does complain of occasional episodes of substernal chest pain but she has normal coronary arteries by cath which I performed 11/08/2010. Her edema is improved today. She denies chest pain but continues to get some dyspnea on exertion. She walks with a walker. Recently, she's noticed new onset palpitations.   Current Outpatient Prescriptions  Medication Sig Dispense Refill  . amLODipine (NORVASC) 5 MG tablet TAKE ONE-HALF (1/2) TABLET DAILY 45 tablet 3  . aspirin 325 MG EC tablet Take 325 mg by mouth at bedtime.     Marland Kitchen atorvastatin (LIPITOR) 40 MG tablet Take 1 tablet (40 mg total) by mouth daily. 90 tablet 3  . Cholecalciferol (VITAMIN D) 2000 UNITS tablet Take 2,000 Units by mouth daily.    Marland Kitchen docusate sodium (COLACE) 100 MG capsule Take 100 mg by mouth daily.    Marland Kitchen ezetimibe (ZETIA) 10 MG tablet TAKE 1 TABLET AT BEDTIME 90 tablet 3    . folic acid-pyridoxine-cyancobalamin (FOLBIC) 2.5-25-2 MG TABS tablet Take 1 tablet by mouth daily. 90 each 3  . furosemide (LASIX) 40 MG tablet TAKE 1 TABLET TWICE A DAY 90 tablet 3  . hydrALAZINE (APRESOLINE) 25 MG tablet TAKE 1 TABLET THREE TIMES A DAY 270 tablet 3  . isosorbide mononitrate (IMDUR) 30 MG 24 hr tablet Take 1 tablet (30 mg total) by mouth daily. 90 tablet 3  . LANTUS SOLOSTAR 100 UNIT/ML Solostar Pen Inject 14 Units into the skin every evening.    . loratadine (CLARITIN) 10 MG tablet Take 10 mg by mouth every morning.    . metolazone (ZAROXOLYN) 2.5 MG tablet Take 1 tablet (2.5 mg total) by mouth every other day. 45 tablet 3  . potassium chloride (K-DUR,KLOR-CON) 10 MEQ tablet Take 1 tablet (10 mEq total) by mouth 2 (two) times daily. 180 tablet 3  . telmisartan (MICARDIS) 80 MG tablet TAKE 1 TABLET BY MOUTH DAILY 90 tablet 3   No current facility-administered medications for this visit.     Allergies  Allergen Reactions  . Ace Inhibitors Other (See Comments)    Angioedema   . Diovan [Valsartan]     Social History   Social History  . Marital status: Widowed    Spouse name: N/A  . Number of children: N/A  . Years of education: N/A   Occupational History  . Not on file.   Social History Main Topics  . Smoking status: Never Smoker  . Smokeless tobacco: Never Used  . Alcohol use  No  . Drug use: No  . Sexual activity: No   Other Topics Concern  . Not on file   Social History Narrative  . No narrative on file     Review of Systems: General: negative for chills, fever, night sweats or weight changes.  Cardiovascular: negative for chest pain, dyspnea on exertion, edema, orthopnea, palpitations, paroxysmal nocturnal dyspnea or shortness of breath Dermatological: negative for rash Respiratory: negative for cough or wheezing Urologic: negative for hematuria Abdominal: negative for nausea, vomiting, diarrhea, bright red blood per rectum, melena, or  hematemesis Neurologic: negative for visual changes, syncope, or dizziness All other systems reviewed and are otherwise negative except as noted above.    Blood pressure 132/68, pulse 82, height 5\' 3"  (1.6 m), weight 184 lb 6.4 oz (83.6 kg).  General appearance: alert and no distress Neck: no adenopathy, no carotid bruit, no JVD, supple, symmetrical, trachea midline and thyroid not enlarged, symmetric, no tenderness/mass/nodules Lungs: clear to auscultation bilaterally Heart: regular rate and rhythm, S1, S2 normal, no murmur, click, rub or gallop Extremities: extremities normal, atraumatic, no cyanosis or edema  EKG not performed today. Her most recent EKG performed 10/02/16 showed sinus rhythm at 64 without ST or T-wave changes.  ASSESSMENT AND PLAN:   Hypertension History of essential hypertension blood pressure measured today at 132/68. She is on amlodipine, hydralazine and Micardis . Current meds at current dosing  Hyperlipidemia History of hyperlipidemia on statin therapy followed by her PCP  Palpitations Recent onset of palpitations which has been noticeable. We will check a 2 week event monitor.      Lorretta Harp MD FACP,FACC,FAHA, [facility] 02/13/2017 10:25 AM

## 2017-02-26 ENCOUNTER — Ambulatory Visit (INDEPENDENT_AMBULATORY_CARE_PROVIDER_SITE_OTHER): Payer: Medicare PPO | Admitting: Podiatry

## 2017-02-26 ENCOUNTER — Encounter: Payer: Self-pay | Admitting: Podiatry

## 2017-02-26 DIAGNOSIS — M79676 Pain in unspecified toe(s): Secondary | ICD-10-CM

## 2017-02-26 DIAGNOSIS — E1151 Type 2 diabetes mellitus with diabetic peripheral angiopathy without gangrene: Secondary | ICD-10-CM

## 2017-02-26 DIAGNOSIS — E1142 Type 2 diabetes mellitus with diabetic polyneuropathy: Secondary | ICD-10-CM

## 2017-02-26 DIAGNOSIS — L84 Corns and callosities: Secondary | ICD-10-CM | POA: Diagnosis not present

## 2017-02-26 DIAGNOSIS — B351 Tinea unguium: Secondary | ICD-10-CM | POA: Diagnosis not present

## 2017-02-26 NOTE — Progress Notes (Signed)
Patient ID: Maria [name], female   DOB: [birth year], 83 y.o.   MRN: [id number]   Subjective: This patient presents today complaining of thickened and elongated toenails which are uncomfortable walking wearing shoes and requests toenail debridement. She is also complaining of painful corn fifth right toe. Patient is diabetic and requesting diabetic shoes  Objective:  Orientated 3  Vascular: Pitting edema bilaterally DP pulses 2/4 right 1/4 left PT pulses trace palpable bilaterally Capillary reflex immediate bilaterally  Neurological: Sensation to 10 g monofilament wire intact 1/5 right 2/5 left Vibratory sensation reactive bilaterally Ankle reflex equal and reactive bilaterally  Dermatological: No skin lesions bilaterally Atrophic skin with absent hair growth bilaterally Corn fifth right toe The toenails elongated, brittle, hypertrophic, discolored and tender to palpation 6-10  Musculoskeletal: Pes planus bilaterally Hammertoe second bilaterally  Assessment: Diabetic peripheral neuropathy Diabetic peripheral arterial disease Symptomatic mycotic toenails 10 Corn 1   Debridement toenails 6-10 mechanically and electrically without any bleeding Debride corns 1 without a bleeding  Foam impression for diabetic shoes today Indications decreased sensation to 10 g monofilament wire Decreased posterior tibial pulses Hammertoes  Reappoint 3 months force skin a nail debridement and notify patient upon receipt of diabetic shoes

## 2017-02-26 NOTE — Patient Instructions (Signed)

## 2017-02-27 ENCOUNTER — Ambulatory Visit (INDEPENDENT_AMBULATORY_CARE_PROVIDER_SITE_OTHER): Payer: Medicare PPO

## 2017-02-27 DIAGNOSIS — R002 Palpitations: Secondary | ICD-10-CM

## 2017-03-29 ENCOUNTER — Telehealth: Payer: Self-pay | Admitting: Cardiovascular Disease

## 2017-03-29 NOTE — Telephone Encounter (Signed)
New Message     Pt is calling for holter monitor results

## 2017-03-29 NOTE — Telephone Encounter (Signed)
Patient called back and given results:  Notes recorded by Lorretta Harp, MD on 03/17/2017 at 2:07 PM EDT 1. SR with occasional PVCs  Patient verbalized her understanding.

## 2017-05-27 ENCOUNTER — Ambulatory Visit (INDEPENDENT_AMBULATORY_CARE_PROVIDER_SITE_OTHER): Payer: Medicare PPO | Admitting: Podiatry

## 2017-05-27 ENCOUNTER — Encounter: Payer: Self-pay | Admitting: Podiatry

## 2017-05-27 DIAGNOSIS — M2041 Other hammer toe(s) (acquired), right foot: Secondary | ICD-10-CM

## 2017-05-27 DIAGNOSIS — M79676 Pain in unspecified toe(s): Secondary | ICD-10-CM | POA: Diagnosis not present

## 2017-05-27 DIAGNOSIS — I7389 Other specified peripheral vascular diseases: Secondary | ICD-10-CM

## 2017-05-27 DIAGNOSIS — L84 Corns and callosities: Secondary | ICD-10-CM

## 2017-05-27 DIAGNOSIS — M2042 Other hammer toe(s) (acquired), left foot: Secondary | ICD-10-CM

## 2017-05-27 DIAGNOSIS — E1142 Type 2 diabetes mellitus with diabetic polyneuropathy: Secondary | ICD-10-CM | POA: Diagnosis not present

## 2017-05-27 DIAGNOSIS — B351 Tinea unguium: Secondary | ICD-10-CM

## 2017-05-27 NOTE — Progress Notes (Signed)
Patient ID: Maria [name], female   DOB: 1926-09-04, 83 y.o.   MRN: [id number]   Subjective:  This patient presents today complaining of thickened and elongated toenails which are uncomfortable walking wearing shoes and requests toenail debridement. She is also complaining of painful corn fifth right toe. Patient also is also scheduled to dispense diabetic shoes and custom insoles   Orientated 3  Vascular: Pitting edema bilaterally DP pulses 2/4 right 1/4 left PT pulses trace palpable bilaterally Capillary reflex immediate bilaterally  Neurological: Sensation to 10 g monofilament wire intact 1/5 right 2/5 left Vibratory sensation reactive bilaterally Ankle reflex equal and reactive bilaterally  Dermatological: No skin lesions bilaterally Atrophic skin with absent hair growth bilaterally Corn fifth right toe The toenails elongated, brittle, hypertrophic, discolored and tender to palpation 6-10  Musculoskeletal: Pes planus bilaterally Hammertoe second bilaterally  Assessment: Diabetic peripheral neuropathy Diabetic peripheral arterial disease Symptomatic mycotic toenails 10 Corn 1   Debridement toenails 6-10 mechanically and electrically with slight bleeding distal fourth right toe. Treated with topical antibiotic ointment and Band-Aid. Patient instructed to removed Band-Aid 1-3 days and continue apply topical antibiotic ointment and Band-Aid until a scab forms Debride corn fifth right toe and pad without any bleeding  Indications for diabetic shoes: Diabetic peripheral neuropathy Hammertoe second bilaterally Dispensed new balance shoes 2, 1540 V2 ,9.5 wide Heat molded custom insoles 6 Satisfactory fit of shoes and insoles and wearing instruction provided  Reappoint 3 months

## 2017-05-27 NOTE — Patient Instructions (Signed)
Removed Band-Aid on the fourth right toe and 1-3 days and apply topical antibiotic ointment such as triple antibiotic ointment to the end of the toe daily and cover with a Band-Aid until a scab forms  Diabetes and Foot Care Diabetes may cause you to have problems because of poor blood supply (circulation) to your feet and legs. This may cause the skin on your feet to become thinner, break easier, and heal more slowly. Your skin may become dry, and the skin may peel and crack. You may also have nerve damage in your legs and feet causing decreased feeling in them. You may not notice minor injuries to your feet that could lead to infections or more serious problems. Taking care of your feet is one of the most important things you can do for yourself. Follow these instructions at home:  Wear shoes at all times, even in the house. Do not go barefoot. Bare feet are easily injured.  Check your feet daily for blisters, cuts, and redness. If you cannot see the bottom of your feet, use a mirror or ask someone for help.  Wash your feet with warm water (do not use hot water) and mild soap. Then pat your feet and the areas between your toes until they are completely dry. Do not soak your feet as this can dry your skin.  Apply a moisturizing lotion or petroleum jelly (that does not contain alcohol and is unscented) to the skin on your feet and to dry, brittle toenails. Do not apply lotion between your toes.  Trim your toenails straight across. Do not dig under them or around the cuticle. File the edges of your nails with an emery board or nail file.  Do not cut corns or calluses or try to remove them with medicine.  Wear clean socks or stockings every day. Make sure they are not too tight. Do not wear knee-high stockings since they may decrease blood flow to your legs.  Wear shoes that fit properly and have enough cushioning. To break in new shoes, wear them for just a few hours a day. This prevents  you from injuring your feet. Always look in your shoes before you put them on to be sure there are no objects inside.  Do not cross your legs. This may decrease the blood flow to your feet.  If you find a minor scrape, cut, or break in the skin on your feet, keep it and the skin around it clean and dry. These areas may be cleansed with mild soap and water. Do not cleanse the area with peroxide, alcohol, or iodine.  When you remove an adhesive bandage, be sure not to damage the skin around it.  If you have a wound, look at it several times a day to make sure it is healing.  Do not use heating pads or hot water bottles. They may burn your skin. If you have lost feeling in your feet or legs, you may not know it is happening until it is too late.  Make sure your health care provider performs a complete foot exam at least annually or more often if you have foot problems. Report any cuts, sores, or bruises to your health care provider immediately. Contact a health care provider if:  You have an injury that is not healing.  You have cuts or breaks in the skin.  You have an ingrown nail.  You notice redness on your legs or feet.  You feel burning  or tingling in your legs or feet.  You have pain or cramps in your legs and feet.  Your legs or feet are numb.  Your feet always feel cold. Get help right away if:  There is increasing redness, swelling, or pain in or around a wound.  There is a red line that goes up your leg.  Pus is coming from a wound.  You develop a fever or as directed by your health care provider.  You notice a bad smell coming from an ulcer or wound. This information is not intended to replace advice given to you by your health care provider. Make sure you discuss any questions you have with your health care provider. Document Released: 08/24/2000 Document Revised: 02/02/2016 Document Reviewed: 02/03/2013 Elsevier Interactive Patient Education  2017 [employer].

## 2017-05-29 ENCOUNTER — Ambulatory Visit: Payer: Medicare PPO | Admitting: Podiatry

## 2017-06-05 ENCOUNTER — Telehealth: Payer: Self-pay | Admitting: Cardiovascular Disease

## 2017-06-05 NOTE — Telephone Encounter (Signed)
New message      *STAT* If patient is at the pharmacy, call can be transferred to refill team.   1. Which medications need to be refilled? (please list name of each medication and dose if known)  folic acid-pyridoxine-cyancobalamin (FOLBIC) 2.5-25-2 MG TABS tablet Take 1 tablet by mouth daily.     2. Which pharmacy/location (including street and city if local pharmacy) is [facility]   3. Do they need a 30 day or 90 day supply? [place]

## 2017-06-06 ENCOUNTER — Other Ambulatory Visit: Payer: Self-pay

## 2017-06-06 MED ORDER — FA-PYRIDOXINE-CYANOCOBALAMIN 2.5-25-2 MG PO TABS: 1.0000 | ORAL_TABLET | Freq: Every day | ORAL | 3 refills | Status: DC

## 2017-08-27 ENCOUNTER — Ambulatory Visit: Payer: Medicare PPO | Admitting: Podiatry

## 2017-08-27 DIAGNOSIS — M79676 Pain in unspecified toe(s): Secondary | ICD-10-CM

## 2017-08-27 DIAGNOSIS — L84 Corns and callosities: Secondary | ICD-10-CM

## 2017-08-27 DIAGNOSIS — E1140 Type 2 diabetes mellitus with diabetic neuropathy, unspecified: Secondary | ICD-10-CM

## 2017-08-27 DIAGNOSIS — B351 Tinea unguium: Secondary | ICD-10-CM

## 2017-08-27 DIAGNOSIS — E1142 Type 2 diabetes mellitus with diabetic polyneuropathy: Secondary | ICD-10-CM

## 2017-08-27 DIAGNOSIS — M2042 Other hammer toe(s) (acquired), left foot: Secondary | ICD-10-CM

## 2017-08-27 DIAGNOSIS — M2041 Other hammer toe(s) (acquired), right foot: Secondary | ICD-10-CM

## 2017-08-27 DIAGNOSIS — Q828 Other specified congenital malformations of skin: Secondary | ICD-10-CM

## 2017-08-27 DIAGNOSIS — E1151 Type 2 diabetes mellitus with diabetic peripheral angiopathy without gangrene: Secondary | ICD-10-CM

## 2017-08-27 DIAGNOSIS — I7389 Other specified peripheral vascular diseases: Secondary | ICD-10-CM

## 2017-08-27 NOTE — Progress Notes (Signed)
She presents today chief complaint of painful elongated toenails.  Objective: Vital signs are stable she is alert and oriented x3.  Pulses are palpable.  Capillary fill time is immediate neurologic sensorium is intact.  Toenails are long thick yellow dystrophic clinically mycotic painful on palpation.  Assessment: Pain in limb secondary to onychomycosis.  Plan: Debridement of toenails 1 through 5 bilateral.  Follow-up with Dr. Amalia Hailey.

## 2017-10-04 ENCOUNTER — Telehealth: Payer: Self-pay | Admitting: Cardiovascular Disease

## 2017-10-04 MED ORDER — FA-PYRIDOXINE-CYANOCOBALAMIN 2.5-25-2 MG PO TABS: 1.0000 | ORAL_TABLET | Freq: Every day | ORAL | 1 refills | Status: DC

## 2017-10-04 NOTE — Telephone Encounter (Signed)
New message     *STAT* If patient is at the pharmacy, call can be transferred to refill team.   1. Which medications need to be refilled? (please list name of each medication and dose if known) folic acid-pyridoxine-cyancobalamin (FOLBIC) 2.5-25-2 MG TABS tablet  2. Which pharmacy/location (including street and city if local pharmacy) is medication to be sent to? [facility]   3. Do they need a 30 day or 90 day supply? [place]

## 2017-10-04 NOTE — Telephone Encounter (Signed)
Rx has been sent to the pharmacy electronically. ° °

## 2017-11-12 ENCOUNTER — Other Ambulatory Visit: Payer: Self-pay | Admitting: Internal Medicine

## 2017-11-12 DIAGNOSIS — E2839 Other primary ovarian failure: Secondary | ICD-10-CM

## 2017-11-26 ENCOUNTER — Ambulatory Visit: Payer: Medicare PPO | Admitting: Podiatry

## 2017-12-03 ENCOUNTER — Ambulatory Visit: Payer: Medicare PPO | Admitting: Podiatry

## 2017-12-03 DIAGNOSIS — E1142 Type 2 diabetes mellitus with diabetic polyneuropathy: Secondary | ICD-10-CM | POA: Diagnosis not present

## 2017-12-03 DIAGNOSIS — B351 Tinea unguium: Secondary | ICD-10-CM | POA: Diagnosis not present

## 2017-12-03 DIAGNOSIS — M79676 Pain in unspecified toe(s): Secondary | ICD-10-CM

## 2017-12-04 NOTE — Progress Notes (Addendum)
Subjective: 84 y.o. returns the office today for painful, elongated, thickened toenails which she cannot trim herself. Denies any redness or drainage around the nails. Denies any acute changes since last appointment and no new complaints today. Denies any systemic complaints such as fevers, chills, nausea, vomiting.   PCP: Glendale Chard, MD Objective: AAO 3, NAD DP/PT pulses palpable, CRT less than 3 seconds Previous been diagnosed with neuropathy  Nails hypertrophic, dystrophic, elongated, brittle, discolored 10. There is tenderness overlying the nails 1-5 bilaterally. There is no surrounding erythema or drainage along the nail sites. No open lesions or pre-ulcerative lesions are identified. Hammertoes are present to the 2nd digits and flatfoot deformity is present.  No other areas of tenderness bilateral lower extremities. No overlying edema, erythema, increased warmth. No pain with calf compression, swelling, warmth, erythema.  Assessment: Patient presents with symptomatic onychomycosis  Plan: -Treatment options including alternatives, risks, complications were discussed -Nails sharply debrided 10 without complication/bleeding. -Discussed daily foot inspection. If there are any changes, to call the office immediately.  -Given digital deformity, flatfoot, neuropathy I do think she would benefit from diabetic shoes. She will be reappointment for measurements.  -Follow-up in 3 months or sooner if any problems are to arise. In the meantime, encouraged to call the office with any questions, concerns, changes symptoms.  Celesta Gentile, DPM

## 2017-12-17 ENCOUNTER — Ambulatory Visit: Payer: Medicare PPO | Admitting: Orthotics

## 2017-12-17 DIAGNOSIS — L84 Corns and callosities: Secondary | ICD-10-CM

## 2017-12-17 DIAGNOSIS — E1151 Type 2 diabetes mellitus with diabetic peripheral angiopathy without gangrene: Secondary | ICD-10-CM

## 2017-12-17 DIAGNOSIS — Q828 Other specified congenital malformations of skin: Secondary | ICD-10-CM

## 2017-12-17 DIAGNOSIS — M2041 Other hammer toe(s) (acquired), right foot: Secondary | ICD-10-CM

## 2017-12-17 DIAGNOSIS — E1140 Type 2 diabetes mellitus with diabetic neuropathy, unspecified: Secondary | ICD-10-CM

## 2017-12-17 DIAGNOSIS — M79676 Pain in unspecified toe(s): Secondary | ICD-10-CM

## 2017-12-17 DIAGNOSIS — M2042 Other hammer toe(s) (acquired), left foot: Secondary | ICD-10-CM

## 2017-12-17 NOTE — Progress Notes (Signed)
Patient is here for diabetic shoe measurement and evaluation.  Patient is previous patient of [facility]; She is under care of Dr. Jacqualyn Posey DPM and Bryon Lions MD.  She chose [phone] [facility] AND Measured the same on [place]

## 2018-01-07 ENCOUNTER — Other Ambulatory Visit: Payer: Self-pay | Admitting: Cardiovascular Disease

## 2018-01-07 NOTE — Telephone Encounter (Signed)
Rx(s) sent to pharmacy electronically.  

## 2018-01-09 ENCOUNTER — Ambulatory Visit (INDEPENDENT_AMBULATORY_CARE_PROVIDER_SITE_OTHER): Payer: Medicare PPO | Admitting: Ophthalmology

## 2018-01-09 DIAGNOSIS — H35033 Hypertensive retinopathy, bilateral: Secondary | ICD-10-CM | POA: Diagnosis not present

## 2018-01-09 DIAGNOSIS — H43813 Vitreous degeneration, bilateral: Secondary | ICD-10-CM

## 2018-01-09 DIAGNOSIS — I10 Essential (primary) hypertension: Secondary | ICD-10-CM | POA: Diagnosis not present

## 2018-01-09 DIAGNOSIS — E113293 Type 2 diabetes mellitus with mild nonproliferative diabetic retinopathy without macular edema, bilateral: Secondary | ICD-10-CM

## 2018-01-09 DIAGNOSIS — E11319 Type 2 diabetes mellitus with unspecified diabetic retinopathy without macular edema: Secondary | ICD-10-CM | POA: Diagnosis not present

## 2018-01-09 DIAGNOSIS — H353111 Nonexudative age-related macular degeneration, right eye, early dry stage: Secondary | ICD-10-CM | POA: Diagnosis not present

## 2018-02-04 ENCOUNTER — Other Ambulatory Visit: Payer: Self-pay | Admitting: Cardiovascular Disease

## 2018-02-14 ENCOUNTER — Ambulatory Visit: Payer: Medicare PPO | Admitting: Cardiovascular Disease

## 2018-02-14 ENCOUNTER — Encounter: Payer: Self-pay | Admitting: Cardiovascular Disease

## 2018-02-14 VITALS — BP 136/50 | HR 86 | Ht 63.0 in | Wt 176.0 lb

## 2018-02-14 DIAGNOSIS — R0600 Dyspnea, unspecified: Secondary | ICD-10-CM

## 2018-02-14 DIAGNOSIS — I10 Essential (primary) hypertension: Secondary | ICD-10-CM

## 2018-02-14 DIAGNOSIS — E7800 Pure hypercholesterolemia, unspecified: Secondary | ICD-10-CM

## 2018-02-14 DIAGNOSIS — R002 Palpitations: Secondary | ICD-10-CM

## 2018-02-14 DIAGNOSIS — R0609 Other forms of dyspnea: Secondary | ICD-10-CM

## 2018-02-14 DIAGNOSIS — I519 Heart disease, unspecified: Secondary | ICD-10-CM | POA: Diagnosis not present

## 2018-02-14 DIAGNOSIS — R600 Localized edema: Secondary | ICD-10-CM | POA: Diagnosis not present

## 2018-02-14 MED ORDER — METOLAZONE 2.5 MG PO TABS: 2.5000 mg | ORAL_TABLET | Freq: Every day | ORAL | 1 refills | Status: DC

## 2018-02-14 NOTE — Assessment & Plan Note (Signed)
History of essential hypertension with blood pressure measured today at 136/50.  She is on amlodipine, Micardis and hydralazine.  Continue current meds at current dosing.

## 2018-02-14 NOTE — Assessment & Plan Note (Signed)
History of bilateral lower extremity edema on furosemide twice daily and every other day Zaroxolyn.  She is now aware of salt restriction and avoid salt.  She does have 2+ pitting edema.  I am going to increase her Zaroxolyn to daily and will check a basic metabolic panel in 2 weeks.

## 2018-02-14 NOTE — Assessment & Plan Note (Signed)
History of dyspnea on exertion which is chronic.  Her last 2D echo performed 04/05/2015 revealed moderate LVH, normal LV function with grade 1 diastolic dysfunction.  There are no valvular abnormalities.  I suspect that her shortness of breath is related to diastolic heart failure.  She is on twice daily furosemide and every other day Zaroxolyn.  She does have 2+ pitting edema.  I am going to increase her Zaroxolyn to daily and will check a basic metabolic panel in 2 weeks.

## 2018-02-14 NOTE — Assessment & Plan Note (Signed)
History of hyperlipidemia on a statin drug and Zetia

## 2018-02-14 NOTE — Progress Notes (Signed)
02/14/2018 Maria [name]   [birth year]  [id number]  Primary Physician Glendale Chard, MD Primary Cardiologist: Lorretta Harp MD FACP, [place], [place], [place]  HPI:  Maria [name] is a 84 y.o.  moderately overweight, widowed Caucasian female, mother of 2, grandmother to 2 grandchildren who I last saw in the office 02/13/2017.Marland Kitchen Her problems include hypertension, hyperlipidemia, diabetes and obstructive sleep apnea on CPAP. She has normal coronary arteries and normal LV function by cath performed/29/12. She does have diastolic dysfunction. When I saw her back in February she was complaining of dyspnea and I did begin her on Zaroxolyn twice a week which resulted in some improvement in her symptoms as well as in her lower extremity edema, though, today she clearly admits to dietary indiscretion with regards to salt. She was admitted to [facility] for 4 days last month because of diverticulitis. Since her last office visit she continues to have dietary indiscretion related to salt with significant lower extremity edema and shortness of breath as well as palpitations. She does complain of occasional episodes of substernal chest pain but she has normal coronary arteries by cath which I performed 11/08/2010. Her edema is improved today. She denies chest pain but continues to get some dyspnea on exertion. She walks with a walker.  She has chronic palpitations and had an event monitor performed a year ago that showed sinus rhythm with PVCs.  Her last 2D echo performed 04/05/2015 revealed normal LV systolic function, moderate concentric LVH with grade 1 diastolic dysfunction.  She does complain of chronic dyspnea which I suspect is related to chronic diastolic heart failure.  She also has chronic bilateral lower extremity edema and has admitted to dietary indiscretion with regards to salt in the past.      Current Meds  Medication Sig  . amLODipine (NORVASC) 5 MG tablet TAKE ONE-HALF (1/2) TABLET DAILY    . aspirin 325 MG EC tablet Take 325 mg by mouth at bedtime.   Marland Kitchen atorvastatin (LIPITOR) 40 MG tablet TAKE 1 TABLET DAILY  . Cholecalciferol (VITAMIN D) 2000 UNITS tablet Take 2,000 Units by mouth daily.  Marland Kitchen docusate sodium (COLACE) 100 MG capsule Take 100 mg by mouth daily.  Marland Kitchen ezetimibe (ZETIA) 10 MG tablet TAKE 1 TABLET AT BEDTIME  . folic acid-pyridoxine-cyancobalamin (FOLBIC) 2.5-25-2 MG TABS tablet Take 1 tablet by mouth daily.  . furosemide (LASIX) 40 MG tablet TAKE 1 TABLET TWICE A DAY  . hydrALAZINE (APRESOLINE) 25 MG tablet Take 1 tablet (25 mg total) by mouth 3 (three) times daily.  . isosorbide mononitrate (IMDUR) 30 MG 24 hr tablet Take 1 tablet (30 mg total) by mouth daily.  Marland Kitchen LANTUS SOLOSTAR 100 UNIT/ML Solostar Pen Inject 14 Units into the skin every evening.  . loratadine (CLARITIN) 10 MG tablet Take 10 mg by mouth every morning.  . metolazone (ZAROXOLYN) 2.5 MG tablet Take 1 tablet (2.5 mg total) by mouth daily. 30 minutes prior to your morning lasix dose.  . potassium chloride (K-DUR,KLOR-CON) 10 MEQ tablet Take 1 tablet (10 mEq total) by mouth 2 (two) times daily.  Marland Kitchen telmisartan (MICARDIS) 80 MG tablet TAKE 1 TABLET BY MOUTH DAILY  . [DISCONTINUED] metolazone (ZAROXOLYN) 2.5 MG tablet Take 1 tablet (2.5 mg total) by mouth every other day.     Allergies  Allergen Reactions  . Ace Inhibitors Other (See Comments)    Angioedema   . Diovan [Valsartan]     Social History   Socioeconomic History  .  Marital status: Widowed    Spouse name: Not on file  . Number of children: Not on file  . Years of education: Not on file  . Highest education level: Not on file  Occupational History  . Not on file  Social Needs  . Financial resource strain: Not on file  . Food insecurity:    Worry: Not on file    Inability: Not on file  . Transportation needs:    Medical: Not on file    Non-medical: Not on file  Tobacco Use  . Smoking status: Never Smoker  . Smokeless tobacco: Never  Used  Substance and Sexual Activity  . Alcohol use: No  . Drug use: No  . Sexual activity: Never  Lifestyle  . Physical activity:    Days per week: Not on file    Minutes per session: Not on file  . Stress: Not on file  Relationships  . Social connections:    Talks on phone: Not on file    Gets together: Not on file    Attends religious service: Not on file    Active member of club or organization: Not on file    Attends meetings of clubs or organizations: Not on file    Relationship status: Not on file  . Intimate partner violence:    Fear of current or ex partner: Not on file    Emotionally abused: Not on file    Physically abused: Not on file    Forced sexual activity: Not on file  Other Topics Concern  . Not on file  Social History Narrative  . Not on file     Review of Systems: General: negative for chills, fever, night sweats or weight changes.  Cardiovascular: negative for chest pain, dyspnea on exertion, edema, orthopnea, palpitations, paroxysmal nocturnal dyspnea or shortness of breath Dermatological: negative for rash Respiratory: negative for cough or wheezing Urologic: negative for hematuria Abdominal: negative for nausea, vomiting, diarrhea, bright red blood per rectum, melena, or hematemesis Neurologic: negative for visual changes, syncope, or dizziness All other systems reviewed and are otherwise negative except as noted above.    Blood pressure (!) 136/50, pulse 86, height 5\' 3"  (1.6 m), weight 176 lb (79.8 kg).  General appearance: alert and no distress Neck: no adenopathy, no carotid bruit, no JVD, supple, symmetrical, trachea midline and thyroid not enlarged, symmetric, no tenderness/mass/nodules Lungs: clear to auscultation bilaterally Heart: regular rate and rhythm, S1, S2 normal, no murmur, click, rub or gallop Extremities: extremities normal, atraumatic, no cyanosis or edema Pulses: 2+ and symmetric Skin: Skin color, texture, turgor normal. No  rashes or lesions Neurologic: Alert and oriented X 3, normal strength and tone. Normal symmetric reflexes. Normal coordination and gait  EKG sinus rhythm 86 right bundle branch block.  I personally reviewed this EKG.  ASSESSMENT AND PLAN:   Hypertension History of essential hypertension with blood pressure measured today at 136/50.  She is on amlodipine, Micardis and hydralazine.  Continue current meds at current dosing.  Bilateral lower extremity edema History of bilateral lower extremity edema on furosemide twice daily and every other day Zaroxolyn.  She is now aware of salt restriction and avoid salt.  She does have 2+ pitting edema.  I am going to increase her Zaroxolyn to daily and will check a basic metabolic panel in 2 weeks.  Hyperlipidemia History of hyperlipidemia on a statin drug and Zetia  Palpitations History of palpitations which she still complains of.  Event monitor performed a  year ago revealed sinus rhythm with occasional PVCs.  Dyspnea on exertion History of dyspnea on exertion which is chronic.  Her last 2D echo performed 04/05/2015 revealed moderate LVH, normal LV function with grade 1 diastolic dysfunction.  There are no valvular abnormalities.  I suspect that her shortness of breath is related to diastolic heart failure.  She is on twice daily furosemide and every other day Zaroxolyn.  She does have 2+ pitting edema.  I am going to increase her Zaroxolyn to daily and will check a basic metabolic panel in 2 weeks.      Lorretta Harp MD FACP,FACC,FAHA, [facility] 02/14/2018 11:31 AM

## 2018-02-14 NOTE — Patient Instructions (Signed)
Medication Instructions: Your physician recommends that you continue on your current medications as directed. Please refer to the Current Medication list given to you today.  Increase Metolazone to 2.5 mg daily--30 minutes prior to your morning dose of furosemide (lasix).   Labwork: Your physician recommends that you return for lab work in: 2 weeks--BMET   Testing/Procedures: Your physician has requested that you have an echocardiogram. Echocardiography is a painless test that uses sound waves to create images of your heart. It provides your doctor with information about the size and shape of your heart and how well your heart's chambers and valves are working. This procedure takes approximately one hour. There are no restrictions for this procedure.  Follow-Up: We request that you follow-up in: 1 month with an extender and in 12 months with Dr Andria Rhein will receive a reminder letter in the mail two months in advance. If you don't receive a letter, please call our office to schedule the follow-up appointment.  If you need a refill on your cardiac medications before your next appointment, please call your pharmacy.

## 2018-02-14 NOTE — Assessment & Plan Note (Signed)
History of palpitations which she still complains of.  Event monitor performed a year ago revealed sinus rhythm with occasional PVCs.

## 2018-02-24 ENCOUNTER — Telehealth: Payer: Self-pay | Admitting: Cardiovascular Disease

## 2018-02-24 NOTE — Telephone Encounter (Signed)
New Message:     Pt says her primary doctor sent her lab work result  here on Friday. She wants to know if she still needs to come here this Friday for her lab work?

## 2018-02-24 NOTE — Telephone Encounter (Signed)
Returned call to patient who reports her PCP faxed labs to our office last week. She states her A1c was 6.7. She did not provide any other labs but I explained that MD wanted to assess renal function since her diuretic was changed. Asked that she have labs done per Dr. Kennon Holter recommendations - 2 weeks after med change. She agreed w/recommendations

## 2018-02-25 ENCOUNTER — Other Ambulatory Visit: Payer: Self-pay

## 2018-02-25 ENCOUNTER — Ambulatory Visit (HOSPITAL_COMMUNITY): Payer: Medicare PPO | Attending: Cardiovascular Disease

## 2018-02-25 DIAGNOSIS — G459 Transient cerebral ischemic attack, unspecified: Secondary | ICD-10-CM | POA: Diagnosis not present

## 2018-02-25 DIAGNOSIS — I519 Heart disease, unspecified: Secondary | ICD-10-CM | POA: Diagnosis not present

## 2018-02-25 DIAGNOSIS — R011 Cardiac murmur, unspecified: Secondary | ICD-10-CM | POA: Insufficient documentation

## 2018-02-25 DIAGNOSIS — R609 Edema, unspecified: Secondary | ICD-10-CM | POA: Insufficient documentation

## 2018-02-25 DIAGNOSIS — G4733 Obstructive sleep apnea (adult) (pediatric): Secondary | ICD-10-CM | POA: Diagnosis not present

## 2018-02-25 DIAGNOSIS — Z8673 Personal history of transient ischemic attack (TIA), and cerebral infarction without residual deficits: Secondary | ICD-10-CM | POA: Diagnosis not present

## 2018-02-25 DIAGNOSIS — I119 Hypertensive heart disease without heart failure: Secondary | ICD-10-CM | POA: Diagnosis not present

## 2018-02-25 DIAGNOSIS — E119 Type 2 diabetes mellitus without complications: Secondary | ICD-10-CM | POA: Insufficient documentation

## 2018-02-25 DIAGNOSIS — R0600 Dyspnea, unspecified: Secondary | ICD-10-CM | POA: Insufficient documentation

## 2018-02-25 DIAGNOSIS — E785 Hyperlipidemia, unspecified: Secondary | ICD-10-CM | POA: Insufficient documentation

## 2018-02-25 DIAGNOSIS — R002 Palpitations: Secondary | ICD-10-CM | POA: Insufficient documentation

## 2018-02-28 LAB — BASIC METABOLIC PANEL
BUN/Creatinine Ratio: 26 (ref 12–28)
BUN: 50 mg/dL — ABNORMAL HIGH (ref 8–27)
CO2: 27 mmol/L (ref 20–29)
Calcium: 9.1 mg/dL (ref 8.7–10.3)
Chloride: 98 mmol/L (ref 96–106)
Creatinine, Ser: 1.93 mg/dL — ABNORMAL HIGH (ref 0.57–1.00)
GFR calc Af Amer: 26 mL/min/{1.73_m2} — ABNORMAL LOW (ref >59)
GFR calc non Af Amer: 23 mL/min/{1.73_m2} — ABNORMAL LOW (ref >59)
Glucose: 115 mg/dL — ABNORMAL HIGH (ref 65–99)
Potassium: 4.3 mmol/L (ref 3.5–5.2)
Sodium: 139 mmol/L (ref 134–144)

## 2018-03-03 ENCOUNTER — Telehealth: Payer: Self-pay | Admitting: *Deleted

## 2018-03-03 DIAGNOSIS — N289 Disorder of kidney and ureter, unspecified: Secondary | ICD-10-CM

## 2018-03-03 MED ORDER — HYDRALAZINE HCL 25 MG PO TABS: 25.0000 mg | ORAL_TABLET | Freq: Three times a day (TID) | ORAL | 3 refills | Status: DC

## 2018-03-03 MED ORDER — FUROSEMIDE 40 MG PO TABS: ORAL_TABLET | ORAL | 3 refills | Status: DC

## 2018-03-03 MED ORDER — AMLODIPINE BESYLATE 5 MG PO TABS: ORAL_TABLET | ORAL | 3 refills | Status: DC

## 2018-03-03 MED ORDER — ATORVASTATIN CALCIUM 40 MG PO TABS: 40.0000 mg | ORAL_TABLET | Freq: Every day | ORAL | 3 refills | Status: DC

## 2018-03-03 MED ORDER — TELMISARTAN 80 MG PO TABS: ORAL_TABLET | ORAL | 3 refills | Status: DC

## 2018-03-03 MED ORDER — METOLAZONE 2.5 MG PO TABS: 2.5000 mg | ORAL_TABLET | ORAL | 3 refills | Status: DC

## 2018-03-03 MED ORDER — ISOSORBIDE MONONITRATE ER 30 MG PO TB24: 30.0000 mg | ORAL_TABLET | Freq: Every day | ORAL | 3 refills | Status: DC

## 2018-03-03 MED ORDER — POTASSIUM CHLORIDE CRYS ER 10 MEQ PO TBCR: 10.0000 meq | EXTENDED_RELEASE_TABLET | Freq: Two times a day (BID) | ORAL | 3 refills | Status: DC

## 2018-03-03 MED ORDER — EZETIMIBE 10 MG PO TABS: ORAL_TABLET | ORAL | 3 refills | Status: DC

## 2018-03-03 NOTE — Telephone Encounter (Addendum)
Spoke with pt, she repeated medication change and need for lab work first of next week. Orders placed.  ----- Message from Lorretta Harp, MD sent at 02/28/2018  4:55 PM EDT ----- Needs to back off of her Zaroxolyn from daily to every other day and check a basic metabolic panel 7 to 10 days after that.

## 2018-03-06 ENCOUNTER — Ambulatory Visit: Payer: Medicare PPO | Admitting: Podiatry

## 2018-03-06 ENCOUNTER — Ambulatory Visit (INDEPENDENT_AMBULATORY_CARE_PROVIDER_SITE_OTHER): Payer: Medicare PPO | Admitting: Orthotics

## 2018-03-06 DIAGNOSIS — E1142 Type 2 diabetes mellitus with diabetic polyneuropathy: Secondary | ICD-10-CM

## 2018-03-06 DIAGNOSIS — Q828 Other specified congenital malformations of skin: Secondary | ICD-10-CM

## 2018-03-06 DIAGNOSIS — M2041 Other hammer toe(s) (acquired), right foot: Secondary | ICD-10-CM

## 2018-03-06 DIAGNOSIS — B351 Tinea unguium: Secondary | ICD-10-CM

## 2018-03-06 DIAGNOSIS — M2042 Other hammer toe(s) (acquired), left foot: Secondary | ICD-10-CM

## 2018-03-06 DIAGNOSIS — M79676 Pain in unspecified toe(s): Secondary | ICD-10-CM

## 2018-03-06 DIAGNOSIS — E1140 Type 2 diabetes mellitus with diabetic neuropathy, unspecified: Secondary | ICD-10-CM

## 2018-03-06 DIAGNOSIS — L84 Corns and callosities: Secondary | ICD-10-CM

## 2018-03-06 NOTE — Progress Notes (Signed)

## 2018-03-06 NOTE — Progress Notes (Signed)
Subjective: 85 y.o. returns the office today for painful, elongated, thickened toenails which she cannot trim herself. Denies any redness or drainage around the nails. She also has a callus on the ball of the left foot that causes pain at times. Denies any acute changes since last appointment and no new complaints today. Denies any systemic complaints such as fevers, chills, nausea, vomiting.   PCP: Glendale Chard, MD Objective: AAO 3, NAD DP/PT pulses palpable, CRT less than 3 seconds Previous been diagnosed with neuropathy  Nails hypertrophic, dystrophic, elongated, brittle, discolored 10. There is tenderness overlying the nails 1-5 bilaterally. There is no surrounding erythema or drainage along the nail sites. Hyperkeratotic lesion left submetatarsal 1. No underlying ulceration, drainage or signs of infection.  No open lesions or pre-ulcerative lesions are identified. Hammertoes are present to the 2nd digits and flatfoot deformity is present.  No other areas of tenderness bilateral lower extremities. No overlying edema, erythema, increased warmth. No pain with calf compression, swelling, warmth, erythema.  Assessment: Patient presents with symptomatic onychomycosis  Plan: -Treatment options including alternatives, risks, complications were discussed -Nails sharply debrided 10 without complication/bleeding. -Hyperkeratotic lesion sharply debrided x 1 without any complications or bleeding.  -Discussed daily foot inspection. If there are any changes, to call the office immediately.  -She is seeing Liliane Channel today to PUDS.  -Follow-up in 3 months or sooner if any problems are to arise. In the meantime, encouraged to call the office with any questions, concerns, changes symptoms.  Celesta Gentile, DPM

## 2018-03-11 ENCOUNTER — Other Ambulatory Visit: Payer: Self-pay | Admitting: Cardiovascular Disease

## 2018-03-11 LAB — BASIC METABOLIC PANEL
BUN/Creatinine Ratio: 24 (ref 12–28)
BUN: 51 mg/dL — ABNORMAL HIGH (ref 8–27)
CO2: 24 mmol/L (ref 20–29)
Calcium: 9 mg/dL (ref 8.7–10.3)
Chloride: 98 mmol/L (ref 96–106)
Creatinine, Ser: 2.12 mg/dL — ABNORMAL HIGH (ref 0.57–1.00)
GFR calc Af Amer: 23 mL/min/{1.73_m2} — ABNORMAL LOW (ref >59)
GFR calc non Af Amer: 20 mL/min/{1.73_m2} — ABNORMAL LOW (ref >59)
Glucose: 177 mg/dL — ABNORMAL HIGH (ref 65–99)
Potassium: 4.6 mmol/L (ref 3.5–5.2)
Sodium: 141 mmol/L (ref 134–144)

## 2018-03-26 ENCOUNTER — Ambulatory Visit (INDEPENDENT_AMBULATORY_CARE_PROVIDER_SITE_OTHER): Payer: Medicare PPO | Admitting: Cardiology

## 2018-03-26 ENCOUNTER — Encounter: Payer: Self-pay | Admitting: Cardiology

## 2018-03-26 VITALS — BP 126/58 | HR 74 | Ht 63.0 in | Wt 174.0 lb

## 2018-03-26 DIAGNOSIS — I110 Hypertensive heart disease with heart failure: Secondary | ICD-10-CM | POA: Diagnosis not present

## 2018-03-26 DIAGNOSIS — R0609 Other forms of dyspnea: Secondary | ICD-10-CM

## 2018-03-26 DIAGNOSIS — R600 Localized edema: Secondary | ICD-10-CM | POA: Diagnosis not present

## 2018-03-26 DIAGNOSIS — N289 Disorder of kidney and ureter, unspecified: Secondary | ICD-10-CM | POA: Diagnosis not present

## 2018-03-26 DIAGNOSIS — Z9989 Dependence on other enabling machines and devices: Secondary | ICD-10-CM

## 2018-03-26 DIAGNOSIS — I5033 Acute on chronic diastolic (congestive) heart failure: Secondary | ICD-10-CM | POA: Diagnosis not present

## 2018-03-26 DIAGNOSIS — I5032 Chronic diastolic (congestive) heart failure: Secondary | ICD-10-CM | POA: Diagnosis not present

## 2018-03-26 DIAGNOSIS — N189 Chronic kidney disease, unspecified: Secondary | ICD-10-CM | POA: Insufficient documentation

## 2018-03-26 DIAGNOSIS — G4733 Obstructive sleep apnea (adult) (pediatric): Secondary | ICD-10-CM

## 2018-03-26 DIAGNOSIS — R0600 Dyspnea, unspecified: Secondary | ICD-10-CM

## 2018-03-26 LAB — BASIC METABOLIC PANEL
BUN/Creatinine Ratio: 25 (ref 12–28)
BUN: 45 mg/dL — ABNORMAL HIGH (ref 8–27)
CO2: 25 mmol/L (ref 20–29)
Calcium: 9.1 mg/dL (ref 8.7–10.3)
Chloride: 96 mmol/L (ref 96–106)
Creatinine, Ser: 1.79 mg/dL — ABNORMAL HIGH (ref 0.57–1.00)
GFR calc Af Amer: 29 mL/min/{1.73_m2} — ABNORMAL LOW (ref >59)
GFR calc non Af Amer: 25 mL/min/{1.73_m2} — ABNORMAL LOW (ref >59)
Glucose: 136 mg/dL — ABNORMAL HIGH (ref 65–99)
Potassium: 4.6 mmol/L (ref 3.5–5.2)
Sodium: 137 mmol/L (ref 134–144)

## 2018-03-26 MED ORDER — ASPIRIN EC 81 MG PO TBEC: 81.0000 mg | DELAYED_RELEASE_TABLET | Freq: Every day | ORAL | Status: DC

## 2018-03-26 MED ORDER — METOLAZONE 2.5 MG PO TABS: ORAL_TABLET | ORAL | 3 refills | Status: DC

## 2018-03-26 NOTE — Assessment & Plan Note (Signed)
Chronic- suspect secondary to venous insufficiency and sodium -stable today

## 2018-03-26 NOTE — Assessment & Plan Note (Signed)
B/P stable. Echo June 2019- normal LVF, normal wall thickness with grade 1 DD

## 2018-03-26 NOTE — Assessment & Plan Note (Signed)
Improved with diuresis 

## 2018-03-26 NOTE — Assessment & Plan Note (Signed)
She currently appears stable

## 2018-03-26 NOTE — Assessment & Plan Note (Signed)
compliant

## 2018-03-26 NOTE — Progress Notes (Signed)
03/26/2018 Maria [name]   1926/03/29  [id number]  Primary Physician Glendale Chard, MD Primary Cardiologist: Dr Gwenlyn Found  HPI:  Pleasant 84 y/o female followed by Dr Gwenlyn Found and Dr Baird Cancer with a history of HCVD, diastolic CHF, sleep apnea on C-pap, and prior normal coronaries and normal LVF.  She saw Dr Gwenlyn Found in June and had increased LE edema. He increased her Zaroxolyn to QD. BMP two weeks later showed a bump in her SCr to 1.9, previous SCr in 2018 was 1.4.  A f/u BMP was done 7/2- SCr now 2.1. No change in her medications and f/u ordered for today.   The pt says she does not have unusual dyspnea. Her LE edema has improved. Her weight is down 10 lbs from her June OV. She told me she has been drinking extra water because she was worried about her kidney function. She did complain of intermittent palpitations. She has had this in the past and PVCs have been documented on Holter. She was not interested in considering PRN beta blocker.    Current Outpatient Medications  Medication Sig Dispense Refill  . amLODipine (NORVASC) 5 MG tablet TAKE ONE-HALF (1/2) TABLET DAILY 45 tablet 3  . aspirin 81 MG tablet Take 1 tablet (81 mg total) by mouth daily.    Marland Kitchen atorvastatin (LIPITOR) 40 MG tablet Take 1 tablet (40 mg total) by mouth daily. 90 tablet 3  . Cholecalciferol (VITAMIN D) 2000 UNITS tablet Take 2,000 Units by mouth daily.    Marland Kitchen docusate sodium (COLACE) 100 MG capsule Take 100 mg by mouth daily.    Marland Kitchen donepezil (ARICEPT) 10 MG tablet Take 1 tablet by mouth daily.    Marland Kitchen ezetimibe (ZETIA) 10 MG tablet TAKE 1 TABLET AT BEDTIME 90 tablet 3  . FOLBIC 2.5-25-2 MG TABS tablet TAKE 1 TABLET DAILY 90 tablet 3  . furosemide (LASIX) 40 MG tablet TAKE 1 TABLET TWICE A DAY 90 tablet 3  . hydrALAZINE (APRESOLINE) 25 MG tablet Take 1 tablet (25 mg total) by mouth 3 (three) times daily. 270 tablet 3  . isosorbide mononitrate (IMDUR) 30 MG 24 hr tablet Take 1 tablet (30 mg total) by mouth daily. 90 tablet 3  .  LANTUS SOLOSTAR 100 UNIT/ML Solostar Pen Inject 14 Units into the skin every evening.    . loratadine (CLARITIN) 10 MG tablet Take 10 mg by mouth every morning.    . metolazone (ZAROXOLYN) 2.5 MG tablet TAKE 1 TABLET Wednesday AND SATURDAYS 30 minutes prior to your morning lasix dose. 45 tablet 3  . potassium chloride (K-DUR,KLOR-CON) 10 MEQ tablet Take 1 tablet (10 mEq total) by mouth 2 (two) times daily. 180 tablet 3  . telmisartan (MICARDIS) 80 MG tablet TAKE 1 TABLET BY MOUTH DAILY 90 tablet 3   No current facility-administered medications for this visit.     Allergies  Allergen Reactions  . Ace Inhibitors Other (See Comments)    Angioedema   . Diovan [Valsartan]     Past Medical History:  Diagnosis Date  . Anxiety   . Arthritis   . Blood transfusion   . Depression   . Diabetes mellitus   . Edema   . Heart murmur   . Hyperlipidemia   . Hypertension   . Sleep apnea    cpap  . Stroke ([place])    3 x tias  . TIA (transient ischemic attack)     Social History   Socioeconomic History  . Marital status: Widowed  Spouse name: Not on file  . Number of children: Not on file  . Years of education: Not on file  . Highest education level: Not on file  Occupational History  . Not on file  Social Needs  . Financial resource strain: Not on file  . Food insecurity:    Worry: Not on file    Inability: Not on file  . Transportation needs:    Medical: Not on file    Non-medical: Not on file  Tobacco Use  . Smoking status: Never Smoker  . Smokeless tobacco: Never Used  Substance and Sexual Activity  . Alcohol use: No  . Drug use: No  . Sexual activity: Never  Lifestyle  . Physical activity:    Days per week: Not on file    Minutes per session: Not on file  . Stress: Not on file  Relationships  . Social connections:    Talks on phone: Not on file    Gets together: Not on file    Attends religious service: Not on file    Active member of club or organization: Not on  file    Attends meetings of clubs or organizations: Not on file    Relationship status: Not on file  . Intimate partner violence:    Fear of current or ex partner: Not on file    Emotionally abused: Not on file    Physically abused: Not on file    Forced sexual activity: Not on file  Other Topics Concern  . Not on file  Social History Narrative  . Not on file     Family History  Problem Relation Age of Onset  . Prostate cancer Unknown      Review of Systems: General: negative for chills, fever, night sweats or weight changes.  Cardiovascular: negative for chest pain, dyspnea on exertion, edema, orthopnea, palpitations, paroxysmal nocturnal dyspnea or shortness of breath Dermatological: negative for rash Respiratory: negative for cough or wheezing Urologic: negative for hematuria Abdominal: negative for nausea, vomiting, diarrhea, bright red blood per rectum, melena, or hematemesis Neurologic: negative for visual changes, syncope, or dizziness All other systems reviewed and are otherwise negative except as noted above.    Blood pressure (!) 126/58, pulse 74, height 5\' 3"  (1.6 m), weight 174 lb (78.9 kg), SpO2 98 %.  General appearance: alert, cooperative, no distress and mildly obese Neck: no carotid bruit and no JVD Lungs: clear to auscultation bilaterally Heart: regular rate and rhythm and 2/6 systolic murmur AOV and LSB Extremities: trace LE edema Skin: Skin color, texture, turgor normal. No rashes or lesions Neurologic: Grossly normal   ASSESSMENT AND PLAN:   Acute on chronic diastolic CHF (congestive heart failure) (HCC) She currently appears stable  Bilateral lower extremity edema Chronic- suspect secondary to venous insufficiency and sodium -stable today  Dyspnea on exertion Improved with diuresis  Obstructive sleep apnea on CPAP compliant  Hypertensive cardiovascular disease B/P stable. Echo June 2019- normal LVF, normal wall thickness with grade 1  DD  Acute on chronic renal insufficiency decrease Zaroxolyn to Wed and Sat- check BMP today   PLAN  Decrease ASA to 81 mg. Decrease Zaroxolyn to 2.5 mg Wed and Sat. Check BMP today. Instructed to not push fluids. Compression stockings as tolerated. F/U Dr Gwenlyn Found in 3 months.   Kerin Ransom PA-C 03/26/2018 11:35 AM

## 2018-03-26 NOTE — Patient Instructions (Signed)
Medication Instructions:  DECREASE Aspirin to 81mg  Take 1 tablet once a day CHANGE Metolazone to one tablet on Wednesdays and Saturday-Starting this SATURDAY  Labwork: Your physician recommends that you return for lab work in: TODAY-BMET  Testing/Procedures: NONE   Follow-Up: Your physician recommends that you schedule a follow-up appointment in: 3 [place]  Any Other Special Instructions Will Be Listed Below (If Applicable). TRY TO WEAR YOUR SUPPORT HOSE If you need a refill on your cardiac medications before your next appointment, please call your pharmacy.

## 2018-03-26 NOTE — Assessment & Plan Note (Signed)
decrease Zaroxolyn to Wed and Sat- check BMP today

## 2018-03-27 ENCOUNTER — Telehealth: Payer: Self-pay | Admitting: Cardiology

## 2018-03-27 NOTE — Telephone Encounter (Signed)
New Message ° ° °Patient is returning call in reference to labs. Please call to discuss.  °

## 2018-03-27 NOTE — Telephone Encounter (Signed)
Patient notified of lab results and voiced understanding.  

## 2018-04-25 ENCOUNTER — Telehealth: Payer: Self-pay

## 2018-04-25 ENCOUNTER — Other Ambulatory Visit: Payer: Self-pay

## 2018-04-25 DIAGNOSIS — R7989 Other specified abnormal findings of blood chemistry: Secondary | ICD-10-CM

## 2018-04-25 NOTE — Telephone Encounter (Signed)
Notes recorded by Erlene Quan, PA-C on 03/27/2018 at 11:27 AM EDT Please let the patient know her labs looked OK- same Rx.F/U BMP in one month.  Kerin Ransom PA-C 03/27/2018 11:26 AM

## 2018-04-26 LAB — BASIC METABOLIC PANEL
BUN/Creatinine Ratio: 26 (ref 12–28)
BUN: 43 mg/dL — ABNORMAL HIGH (ref 10–36)
CO2: 26 mmol/L (ref 20–29)
Calcium: 9.2 mg/dL (ref 8.7–10.3)
Chloride: 97 mmol/L (ref 96–106)
Creatinine, Ser: 1.66 mg/dL — ABNORMAL HIGH (ref 0.57–1.00)
GFR calc Af Amer: 31 mL/min/{1.73_m2} — ABNORMAL LOW (ref >59)
GFR calc non Af Amer: 27 mL/min/{1.73_m2} — ABNORMAL LOW (ref >59)
Glucose: 225 mg/dL — ABNORMAL HIGH (ref 65–99)
Potassium: 4.4 mmol/L (ref 3.5–5.2)
Sodium: 139 mmol/L (ref 134–144)

## 2018-05-30 ENCOUNTER — Encounter: Payer: Self-pay | Admitting: Internal Medicine

## 2018-06-05 ENCOUNTER — Ambulatory Visit: Payer: Medicare PPO | Admitting: Podiatry

## 2018-06-05 DIAGNOSIS — E1142 Type 2 diabetes mellitus with diabetic polyneuropathy: Secondary | ICD-10-CM

## 2018-06-05 DIAGNOSIS — B351 Tinea unguium: Secondary | ICD-10-CM

## 2018-06-05 DIAGNOSIS — M79676 Pain in unspecified toe(s): Secondary | ICD-10-CM

## 2018-06-05 DIAGNOSIS — E1140 Type 2 diabetes mellitus with diabetic neuropathy, unspecified: Secondary | ICD-10-CM | POA: Diagnosis not present

## 2018-06-05 NOTE — Progress Notes (Signed)
Subjective: 84 y.o. returns the office today for painful, elongated, thickened toenails which she cannot trim herself. Denies any redness or drainage around the nails.  Denies any acute changes since last appointment and no new complaints today. Denies any systemic complaints such as fevers, chills, nausea, vomiting.   PCP: Glendale Chard, MD Objective: AAO 3, NAD DP/PT pulses palpable, CRT less than 3 seconds Previous been diagnosed with neuropathy  Nails hypertrophic, dystrophic, elongated, brittle, discolored 10. There is tenderness overlying the nails 1-5 bilaterally. There is no surrounding erythema or drainage along the nail sites. Very minimal callus formation to the foot today and the area on the left foot is much improved.  No open lesions or pre-ulcerative lesions are identified. Hammertoes are present to the 2nd digits and flatfoot deformity is present.  No pain with calf compression, swelling, warmth, erythema.  Assessment: Patient presents with symptomatic onychomycosis  Plan: -Treatment options including alternatives, risks, complications were discussed -Nails sharply debrided 10 without complication/bleeding. -Discussed daily foot inspection. If there are any changes, to call the office immediately.  -Follow-up in 3 months or sooner if any problems are to arise. In the meantime, encouraged to call the office with any questions, concerns, changes symptoms.  Celesta Gentile, DPM

## 2018-06-25 ENCOUNTER — Encounter: Payer: Self-pay | Admitting: Internal Medicine

## 2018-06-25 ENCOUNTER — Ambulatory Visit: Payer: Medicare PPO | Admitting: Internal Medicine

## 2018-06-25 VITALS — BP 126/78 | HR 90 | Temp 98.4°F | Ht 63.0 in | Wt 174.2 lb

## 2018-06-25 DIAGNOSIS — Z7982 Long term (current) use of aspirin: Secondary | ICD-10-CM

## 2018-06-25 DIAGNOSIS — N183 Chronic kidney disease, stage 3 unspecified: Secondary | ICD-10-CM

## 2018-06-25 DIAGNOSIS — I129 Hypertensive chronic kidney disease with stage 1 through stage 4 chronic kidney disease, or unspecified chronic kidney disease: Secondary | ICD-10-CM

## 2018-06-25 DIAGNOSIS — N182 Chronic kidney disease, stage 2 (mild): Principal | ICD-10-CM

## 2018-06-25 DIAGNOSIS — H538 Other visual disturbances: Secondary | ICD-10-CM

## 2018-06-25 DIAGNOSIS — E1122 Type 2 diabetes mellitus with diabetic chronic kidney disease: Secondary | ICD-10-CM

## 2018-06-25 DIAGNOSIS — Z794 Long term (current) use of insulin: Secondary | ICD-10-CM

## 2018-06-25 DIAGNOSIS — M26609 Unspecified temporomandibular joint disorder, unspecified side: Secondary | ICD-10-CM | POA: Diagnosis not present

## 2018-06-25 DIAGNOSIS — Z23 Encounter for immunization: Secondary | ICD-10-CM

## 2018-06-25 DIAGNOSIS — R630 Anorexia: Secondary | ICD-10-CM

## 2018-06-25 NOTE — Progress Notes (Addendum)
Subjective:     Patient ID: Maria [name] , female    DOB: 1926-01-16 , 84 y.o.   MRN: [id number]   Diabetes  She presents for her follow-up diabetic visit. She has type 2 diabetes mellitus. Her disease course has been stable. There are no hypoglycemic associated symptoms. Associated symptoms include blurred vision. Pertinent negatives for diabetes include no chest pain, no fatigue, no foot ulcerations and no visual change. There are no hypoglycemic complications. Symptoms are stable.    Scheduled to see eye doctor next week.  Past Medical History:  Diagnosis Date  . Anxiety   . Arthritis   . Blood transfusion   . Depression   . Diabetes mellitus   . Edema   . Heart murmur   . Hyperlipidemia   . Hypertension   . Sleep apnea    cpap  . Stroke ([place])    3 x tias  . TIA (transient ischemic attack)       Current Outpatient Medications:  .  ACCU-CHEK FASTCLIX LANCETS MISC, CHECK BLOOD SUGAR BEFORE BREAKFAST AND DINNER, Disp: , Rfl: 2 .  ACCU-CHEK SMARTVIEW test strip, U TO CHECK BID BEFORE BRE AND DINNER, Disp: , Rfl: 9 .  amLODipine (NORVASC) 5 MG tablet, TAKE ONE-HALF (1/2) TABLET DAILY, Disp: 45 tablet, Rfl: 3 .  aspirin 81 MG tablet, Take 1 tablet (81 mg total) by mouth daily., Disp: , Rfl:  .  atorvastatin (LIPITOR) 40 MG tablet, Take 1 tablet (40 mg total) by mouth daily., Disp: 90 tablet, Rfl: 3 .  Cholecalciferol (VITAMIN D) 2000 UNITS tablet, Take 2,000 Units by mouth daily., Disp: , Rfl:  .  citalopram (CELEXA) 20 MG tablet, Take 20 mg by mouth daily., Disp: , Rfl:  .  docusate sodium (COLACE) 100 MG capsule, Take 100 mg by mouth daily., Disp: , Rfl:  .  donepezil (ARICEPT) 10 MG tablet, Take 1 tablet by mouth daily., Disp: , Rfl:  .  ezetimibe (ZETIA) 10 MG tablet, TAKE 1 TABLET AT BEDTIME, Disp: 90 tablet, Rfl: 3 .  FOLBIC 2.5-25-2 MG TABS tablet, TAKE 1 TABLET DAILY, Disp: 90 tablet, Rfl: 3 .  furosemide (LASIX) 40 MG tablet, TAKE 1 TABLET TWICE A DAY, Disp: 90  tablet, Rfl: 3 .  hydrALAZINE (APRESOLINE) 25 MG tablet, Take 1 tablet (25 mg total) by mouth 3 (three) times daily., Disp: 270 tablet, Rfl: 3 .  isosorbide mononitrate (IMDUR) 30 MG 24 hr tablet, Take 1 tablet (30 mg total) by mouth daily., Disp: 90 tablet, Rfl: 3 .  LANTUS SOLOSTAR 100 UNIT/ML Solostar Pen, Inject 14 Units into the skin every evening., Disp: , Rfl:  .  loratadine (CLARITIN) 10 MG tablet, Take 10 mg by mouth every morning., Disp: , Rfl:  .  metolazone (ZAROXOLYN) 2.5 MG tablet, TAKE 1 TABLET Wednesday AND SATURDAYS 30 minutes prior to your morning lasix dose., Disp: 45 tablet, Rfl: 3 .  potassium chloride (K-DUR,KLOR-CON) 10 MEQ tablet, Take 1 tablet (10 mEq total) by mouth 2 (two) times daily., Disp: 180 tablet, Rfl: 3 .  telmisartan (MICARDIS) 80 MG tablet, TAKE 1 TABLET BY MOUTH DAILY, Disp: 90 tablet, Rfl: 3   Allergies  Allergen Reactions  . Ace Inhibitors Other (See Comments)    Angioedema   . Diovan [Valsartan]      Review of Systems  Constitutional: Positive for appetite change. Negative for fatigue.  HENT: Negative.        She c/o jaw pain. Pain is intensified when chewing  tough foods. She is not sure what has triggered her symptoms.   Eyes: Positive for blurred vision and visual disturbance. Negative for photophobia, pain, redness and itching.  Respiratory: Negative.   Cardiovascular: Negative.  Negative for chest pain.  Hematological: Negative.   Psychiatric/Behavioral: Negative.      Today's Vitals   06/25/18 0918  BP: 126/78  Pulse: 90  Temp: 98.4 F (36.9 C)  TempSrc: Oral  Weight: 174 lb 3.2 oz (79 kg)  Height: _0  (1.6 m)   Body mass index is 30.86 kg/m.   Objective:  Physical Exam  Constitutional: She appears well-developed and well-nourished.  Eyes: EOM are normal.  Cardiovascular: Normal rate, regular rhythm and normal heart sounds.  She has some LE edema  Pulmonary/Chest: Effort normal and breath sounds normal.  Psychiatric: She  has a normal mood and affect.  Nursing note and vitals reviewed.       Assessment And Plan:      Type 2 diabetes mellitus with stage 2 chronic kidney disease, with long-term current use of insulin (HCC) - I will check a CMET, hba1c today. She is encouraged to avoid sugary beverages.  - Plan: CMP14+EGFR, Lipid Profile, Hemoglobin A1c, TSH  Chronic renal disease, stage III (HCC) - Chronic. She is encouraged to stay well hydrated.   Hypertensive nephropathy - Controlled. She will continue with current meds. She is encouraged to avoid adding salt to her foods.  - Plan: TSH  TMJ (temporomandibular joint disorder) - She is advised to apply pain cream to her jaw twice daily as needed. She is encouraged to avoid eating tough meats during a flare.   Blurred vision - Occurs during reading. She has eye appt scheduled for next week. She is followed by Dr. Herbert Deaner and her associates.   Poor appetite - She may benefit from Remeron nightly.  I will make further recommendations once her labs available for review. Additionally, her sx could be due to gastroparesi  Need for vaccination - She was given high dose flu vaccine.  - Plan: Flu vaccine HIGH DOSE PF (Fluzone High dose)

## 2018-06-26 LAB — CMP14+EGFR
ALT: 19 IU/L (ref 0–32)
AST: 24 IU/L (ref 0–40)
Albumin/Globulin Ratio: 1.4 (ref 1.2–2.2)
Albumin: 3.7 g/dL (ref 3.2–4.6)
Alkaline Phosphatase: 82 IU/L (ref 39–117)
BUN/Creatinine Ratio: 25 (ref 12–28)
BUN: 44 mg/dL — ABNORMAL HIGH (ref 10–36)
Bilirubin Total: 0.4 mg/dL (ref 0.0–1.2)
CO2: 22 mmol/L (ref 20–29)
Calcium: 9.2 mg/dL (ref 8.7–10.3)
Chloride: 102 mmol/L (ref 96–106)
Creatinine, Ser: 1.78 mg/dL — ABNORMAL HIGH (ref 0.57–1.00)
GFR calc Af Amer: 29 mL/min/{1.73_m2} — ABNORMAL LOW (ref >59)
GFR calc non Af Amer: 25 mL/min/{1.73_m2} — ABNORMAL LOW (ref >59)
Globulin, Total: 2.7 g/dL (ref 1.5–4.5)
Glucose: 136 mg/dL — ABNORMAL HIGH (ref 65–99)
Potassium: 4.6 mmol/L (ref 3.5–5.2)
Sodium: 144 mmol/L (ref 134–144)
Total Protein: 6.4 g/dL (ref 6.0–8.5)

## 2018-06-26 LAB — LIPID PANEL
Chol/HDL Ratio: 2 ratio (ref 0.0–4.4)
Cholesterol, Total: 134 mg/dL (ref 100–199)
HDL: 67 mg/dL (ref >39)
LDL Calculated: 54 mg/dL (ref 0–99)
Triglycerides: 66 mg/dL (ref 0–149)
VLDL Cholesterol Cal: 13 mg/dL (ref 5–40)

## 2018-06-26 LAB — HEMOGLOBIN A1C
Est. average glucose Bld gHb Est-mCnc: 146 mg/dL
Hgb A1c MFr Bld: 6.7 % — ABNORMAL HIGH (ref 4.8–5.6)

## 2018-06-26 LAB — TSH: TSH: 2.2 u[IU]/mL (ref 0.450–4.500)

## 2018-06-26 NOTE — Progress Notes (Signed)
Here are your lab results:  Your kidney function is stable. Be sure to stay well hydrated.  Your liver function is normal.  Your cholesterol is great. Your a1c is 6.7, this is great. Your thyroid function is normal.   Sincerely,    Ozetta Flatley N. Baird Cancer, MD

## 2018-07-07 ENCOUNTER — Telehealth: Payer: Self-pay

## 2018-07-07 NOTE — Telephone Encounter (Signed)
Returned the pt's call and gave her her last lab results.  The pt said she saw her eye doctor at Dr. Payton Emerald office and was told that she has calcium in her eyes and the pt said that everything else was alright but that the pt may need an operation.  The pt wants to know if Dr. Baird Cancer can call and talk to her about this.

## 2018-07-09 ENCOUNTER — Ambulatory Visit: Payer: Medicare PPO | Admitting: Cardiovascular Disease

## 2018-07-09 ENCOUNTER — Encounter: Payer: Self-pay | Admitting: Cardiovascular Disease

## 2018-07-09 DIAGNOSIS — I5033 Acute on chronic diastolic (congestive) heart failure: Secondary | ICD-10-CM | POA: Diagnosis not present

## 2018-07-09 DIAGNOSIS — R002 Palpitations: Secondary | ICD-10-CM

## 2018-07-09 DIAGNOSIS — R600 Localized edema: Secondary | ICD-10-CM | POA: Diagnosis not present

## 2018-07-09 DIAGNOSIS — E7800 Pure hypercholesterolemia, unspecified: Secondary | ICD-10-CM

## 2018-07-09 NOTE — Assessment & Plan Note (Signed)
History of bilateral lower extremity edema on furosemide and metolazone with only trace edema on exam today and creatinine that is stable in the 1.8 range.

## 2018-07-09 NOTE — Assessment & Plan Note (Signed)
History of palpitations most likely related to PVCs.

## 2018-07-09 NOTE — Assessment & Plan Note (Signed)
History of hyperlipidemia on statin therapy with lipid profile performed 06/25/2018 revealing total cholesterol 134, LDL 54 and HDL of 67.

## 2018-07-09 NOTE — Assessment & Plan Note (Signed)
History of chronic diastolic heart failure echo performed 02/25/2018 revealing normal LV systolic function grade 1 diastolic dysfunction on furosemide and twice a week metolazone.

## 2018-07-09 NOTE — Patient Instructions (Signed)
Medication Instructions:  Your physician recommends that you continue on your current medications as directed. Please refer to the Current Medication list given to you today.  If you need a refill on your cardiac medications before your next appointment, please call your pharmacy.   Lab work: none If you have labs (blood work) drawn today and your tests are completely normal, you will receive your results only by: Marland Kitchen MyChart Message (if you have MyChart) OR . A paper copy in the mail If you have any lab test that is abnormal or we need to change your treatment, we will call you to review the results.  Testing/Procedures: none  Follow-Up: At [facility], you and your health needs are our priority.  As part of our continuing mission to provide you with exceptional heart care, we have created designated Provider Care Teams.  These Care Teams include your primary Cardiologist (physician) and Advanced Practice Providers (APPs -  Physician Assistants and Nurse Practitioners) who all work together to provide you with the care you need, when you need it. We request that you follow-up in: 6 months with Maria Ransom, PA and in 12 months with Maria [name]  Please call our office 2 months in advance to schedule this appointment.  You may see  Maria. Gwenlyn [name] or one of the following Advanced Practice Providers on your designated Care Team:   Maria Ransom, PA-C Roby Lofts, [place] . Sande Rives, PA-C  Any Other Special Instructions Will Be Listed Below (If Applicable).

## 2018-07-09 NOTE — Assessment & Plan Note (Signed)
History of essential hypertension her blood pressure measured at 126/59.  She is on amlodipine, hydralazine , and Micardis.  Was somewhat orthostatic today.  She checks her blood pressure at home and in the past when she is had that she eats extra salt.  She does have diastolic heart failure on furosemide and metolazone.  At this point, I am making no changes.

## 2018-07-09 NOTE — Progress Notes (Signed)
07/09/2018 Maria [name]   1926/07/04  [id number]  Primary Physician Glendale Chard, MD Primary Cardiologist: Lorretta Harp MD FACP, [place], [place], [place]  HPI:  Maria [name] is a 84 y.o.  moderately overweight, widowed Caucasian female, mother of 2, grandmother to 2 grandchildren who I last sawin the office  02/14/2018.Marland Kitchen Her problems include hypertension, hyperlipidemia, diabetes and obstructive sleep apnea on CPAP. She has normal coronary arteries and normal LV function by cath performed/29/12. She does have diastolic dysfunction. When I saw her back in February she was complaining of dyspnea and I did begin her on Zaroxolyn twice a week which resulted in some improvement in her symptoms as well as in her lower extremity edema, though, today she clearly admits to dietary indiscretion with regards to salt. She was admitted to [facility] for 4 days last month because of diverticulitis. Since her last office visit she continues to have dietary indiscretion related to salt with significant lower extremity edema and shortness of breath as well as palpitations. She does complain of occasional episodes of substernal chest pain but she has normal coronary arteries by cath which I performed 11/08/2010. Her edema is improved today. She denies chest pain but continues to get some dyspnea on exertion. She walks with a walker.  She has chronic palpitations and had an event monitor performed a year ago that showed sinus rhythm with PVCs.  Her last 2D echo performed 02/25/2018 revealed normal LV systolic function, moderate concentric LVH with grade 1 diastolic dysfunction.  She does complain of chronic dyspnea which I suspect is related to chronic diastolic heart failure.  She also has chronic bilateral lower extremity edema and has admitted to dietary indiscretion with regards to salt in the past. Since I saw her 4 months ago she is remained stable.  She denies chest pain or shortness of breath.  She does  have some orthostatic changes on exam today and at home on occasion for which she takes extra salt by mouth which usually resolves her symptoms.  Current Meds  Medication Sig  . ACCU-CHEK FASTCLIX LANCETS MISC CHECK BLOOD SUGAR BEFORE BREAKFAST AND DINNER  . ACCU-CHEK SMARTVIEW test strip U TO CHECK BID BEFORE BRE AND DINNER  . amLODipine (NORVASC) 5 MG tablet TAKE ONE-HALF (1/2) TABLET DAILY  . aspirin 81 MG tablet Take 1 tablet (81 mg total) by mouth daily.  Marland Kitchen atorvastatin (LIPITOR) 40 MG tablet Take 1 tablet (40 mg total) by mouth daily.  . Cholecalciferol (VITAMIN D) 2000 UNITS tablet Take 2,000 Units by mouth daily.  . citalopram (CELEXA) 20 MG tablet Take 20 mg by mouth daily.  Marland Kitchen docusate sodium (COLACE) 100 MG capsule Take 100 mg by mouth daily.  Marland Kitchen donepezil (ARICEPT) 10 MG tablet Take 1 tablet by mouth daily.  Marland Kitchen ezetimibe (ZETIA) 10 MG tablet TAKE 1 TABLET AT BEDTIME  . FOLBIC 2.5-25-2 MG TABS tablet TAKE 1 TABLET DAILY  . furosemide (LASIX) 40 MG tablet TAKE 1 TABLET TWICE A DAY  . hydrALAZINE (APRESOLINE) 25 MG tablet Take 1 tablet (25 mg total) by mouth 3 (three) times daily.  . isosorbide mononitrate (IMDUR) 30 MG 24 hr tablet Take 1 tablet (30 mg total) by mouth daily.  Marland Kitchen LANTUS SOLOSTAR 100 UNIT/ML Solostar Pen Inject 14 Units into the skin every evening.  . loratadine (CLARITIN) 10 MG tablet Take 10 mg by mouth every morning.  . metolazone (ZAROXOLYN) 2.5 MG tablet TAKE 1 TABLET Wednesday AND SATURDAYS 30 minutes  prior to your morning lasix dose.  . potassium chloride (K-DUR,KLOR-CON) 10 MEQ tablet Take 1 tablet (10 mEq total) by mouth 2 (two) times daily.  Marland Kitchen telmisartan (MICARDIS) 80 MG tablet TAKE 1 TABLET BY MOUTH DAILY     Allergies  Allergen Reactions  . Ace Inhibitors Other (See Comments)    Angioedema   . Diovan [Valsartan]     Social History   Socioeconomic History  . Marital status: Widowed    Spouse name: Not on file  . Number of children: Not on file  .  Years of education: Not on file  . Highest education level: Not on file  Occupational History  . Not on file  Social Needs  . Financial resource strain: Not on file  . Food insecurity:    Worry: Not on file    Inability: Not on file  . Transportation needs:    Medical: Not on file    Non-medical: Not on file  Tobacco Use  . Smoking status: Never Smoker  . Smokeless tobacco: Never Used  Substance and Sexual Activity  . Alcohol use: No  . Drug use: No  . Sexual activity: Never  Lifestyle  . Physical activity:    Days per week: Not on file    Minutes per session: Not on file  . Stress: Not on file  Relationships  . Social connections:    Talks on phone: Not on file    Gets together: Not on file    Attends religious service: Not on file    Active member of club or organization: Not on file    Attends meetings of clubs or organizations: Not on file    Relationship status: Not on file  . Intimate partner violence:    Fear of current or ex partner: Not on file    Emotionally abused: Not on file    Physically abused: Not on file    Forced sexual activity: Not on file  Other Topics Concern  . Not on file  Social History Narrative  . Not on file     Review of Systems: General: negative for chills, fever, night sweats or weight changes.  Cardiovascular: negative for chest pain, dyspnea on exertion, edema, orthopnea, palpitations, paroxysmal nocturnal dyspnea or shortness of breath Dermatological: negative for rash Respiratory: negative for cough or wheezing Urologic: negative for hematuria Abdominal: negative for nausea, vomiting, diarrhea, bright red blood per rectum, melena, or hematemesis Neurologic: negative for visual changes, syncope, or dizziness All other systems reviewed and are otherwise negative except as noted above.    Blood pressure (!) 126/59, pulse 71, height 5\' 3"  (1.6 m), weight 172 lb 3.2 oz (78.1 kg), SpO2 96 %.  General appearance: alert and no  distress Neck: no adenopathy, no carotid bruit, no JVD, supple, symmetrical, trachea midline and thyroid not enlarged, symmetric, no tenderness/mass/nodules Lungs: clear to auscultation bilaterally Heart: regular rate and rhythm, S1, S2 normal, no murmur, click, rub or gallop Extremities: Trace bilateral lower extremity edema. Pulses: 2+ and symmetric Skin: Skin color, texture, turgor normal. No rashes or lesions Neurologic: Alert and oriented X 3, normal strength and tone. Normal symmetric reflexes. Normal coordination and gait  EKG not performed today  ASSESSMENT AND PLAN:   Hypertensive cardiovascular disease History of essential hypertension her blood pressure measured at 126/59.  She is on amlodipine, hydralazine , and Micardis.  Was somewhat orthostatic today.  She checks her blood pressure at home and in the past when she is had  that she eats extra salt.  She does have diastolic heart failure on furosemide and metolazone.  At this point, I am making no changes.  Bilateral lower extremity edema History of bilateral lower extremity edema on furosemide and metolazone with only trace edema on exam today and creatinine that is stable in the 1.8 range.  Hyperlipidemia History of hyperlipidemia on statin therapy with lipid profile performed 06/25/2018 revealing total cholesterol 134, LDL 54 and HDL of 67.  Palpitations History of palpitations most likely related to PVCs.  Acute on chronic diastolic CHF (congestive heart failure) (HCC) History of chronic diastolic heart failure echo performed 02/25/2018 revealing normal LV systolic function grade 1 diastolic dysfunction on furosemide and twice a week metolazone.      Lorretta Harp MD FACP,FACC,FAHA, [facility] 07/09/2018 10:16 AM

## 2018-09-05 ENCOUNTER — Encounter: Payer: Self-pay | Admitting: Podiatry

## 2018-09-05 ENCOUNTER — Ambulatory Visit: Payer: Medicare PPO | Admitting: Podiatry

## 2018-09-05 DIAGNOSIS — B351 Tinea unguium: Secondary | ICD-10-CM

## 2018-09-05 DIAGNOSIS — M79676 Pain in unspecified toe(s): Secondary | ICD-10-CM

## 2018-09-05 DIAGNOSIS — I7389 Other specified peripheral vascular diseases: Secondary | ICD-10-CM

## 2018-09-05 DIAGNOSIS — E1140 Type 2 diabetes mellitus with diabetic neuropathy, unspecified: Secondary | ICD-10-CM

## 2018-09-05 NOTE — Progress Notes (Signed)
Complaint:  Visit Type: Patient returns to my office for continued preventative foot care services. Complaint: Patient states" my nails have grown long and thick and become painful to walk and wear shoes" Patient has been diagnosed with DM with neuropathy. The patient presents for preventative foot care services. No changes to ROS  Podiatric Exam: Vascular: dorsalis pedis and posterior tibial pulses are palpable bilateral. Capillary return is immediate. Temperature gradient is WNL. Skin turgor WNL  Sensorium: Diminished  Semmes Weinstein monofilament test. Normal tactile sensation bilaterally. Nail Exam: Pt has thick disfigured discolored nails with subungual debris noted bilateral entire nail hallux through fifth toenails Ulcer Exam: There is no evidence of ulcer or pre-ulcerative changes or infection. Orthopedic Exam: Muscle tone and strength are WNL. No limitations in general ROM. No crepitus or effusions noted. Foot type and digits show no abnormalities. Bony prominences are unremarkable. Skin: No Porokeratosis. No infection or ulcers  Diagnosis:  Onychomycosis, , Pain in right toe, pain in left toes  Treatment & Plan Procedures and Treatment: Consent by patient was obtained for treatment procedures.   Debridement of mycotic and hypertrophic toenails, 1 through 5 bilateral and clearing of subungual debris. No ulceration, no infection noted.  Return Visit-Office Procedure: Patient instructed to return to the office for a follow up visit 3 months for continued evaluation and treatment.    Gardiner Barefoot DPM

## 2018-09-17 DIAGNOSIS — M25571 Pain in right ankle and joints of right foot: Secondary | ICD-10-CM | POA: Diagnosis not present

## 2018-09-17 DIAGNOSIS — M79641 Pain in right hand: Secondary | ICD-10-CM | POA: Diagnosis not present

## 2018-09-29 ENCOUNTER — Ambulatory Visit: Payer: Medicare PPO | Admitting: Internal Medicine

## 2018-10-01 ENCOUNTER — Ambulatory Visit (INDEPENDENT_AMBULATORY_CARE_PROVIDER_SITE_OTHER): Payer: Medicare Other | Admitting: Internal Medicine

## 2018-10-01 ENCOUNTER — Other Ambulatory Visit: Payer: Self-pay

## 2018-10-01 ENCOUNTER — Encounter: Payer: Self-pay | Admitting: Internal Medicine

## 2018-10-01 VITALS — BP 122/74 | HR 96 | Temp 98.0°F | Ht 63.0 in | Wt 167.2 lb

## 2018-10-01 DIAGNOSIS — Z794 Long term (current) use of insulin: Secondary | ICD-10-CM | POA: Diagnosis not present

## 2018-10-01 DIAGNOSIS — E1122 Type 2 diabetes mellitus with diabetic chronic kidney disease: Secondary | ICD-10-CM

## 2018-10-01 DIAGNOSIS — N183 Chronic kidney disease, stage 3 (moderate): Secondary | ICD-10-CM | POA: Diagnosis not present

## 2018-10-01 DIAGNOSIS — M10341 Gout due to renal impairment, right hand: Secondary | ICD-10-CM | POA: Diagnosis not present

## 2018-10-01 DIAGNOSIS — I129 Hypertensive chronic kidney disease with stage 1 through stage 4 chronic kidney disease, or unspecified chronic kidney disease: Secondary | ICD-10-CM | POA: Diagnosis not present

## 2018-10-01 MED ORDER — ACCU-CHEK FASTCLIX LANCETS MISC: 2 refills | Status: DC

## 2018-10-01 MED ORDER — ACCU-CHEK SMARTVIEW VI STRP: ORAL_STRIP | 2 refills | Status: DC

## 2018-10-01 MED ORDER — INSULIN PEN NEEDLE 32G X 6 MM MISC: 2 refills | Status: DC

## 2018-10-01 NOTE — Patient Instructions (Signed)

## 2018-10-02 LAB — CMP14+EGFR
ALT: 23 IU/L (ref 0–32)
AST: 26 IU/L (ref 0–40)
Albumin/Globulin Ratio: 1.5 (ref 1.2–2.2)
Albumin: 3.8 g/dL (ref 3.5–4.6)
Alkaline Phosphatase: 89 IU/L (ref 39–117)
BUN/Creatinine Ratio: 21 (ref 12–28)
BUN: 36 mg/dL (ref 10–36)
Bilirubin Total: 0.4 mg/dL (ref 0.0–1.2)
CO2: 27 mmol/L (ref 20–29)
Calcium: 9.3 mg/dL (ref 8.7–10.3)
Chloride: 100 mmol/L (ref 96–106)
Creatinine, Ser: 1.75 mg/dL — ABNORMAL HIGH (ref 0.57–1.00)
GFR calc Af Amer: 29 mL/min/{1.73_m2} — ABNORMAL LOW (ref >59)
GFR calc non Af Amer: 25 mL/min/{1.73_m2} — ABNORMAL LOW (ref >59)
Globulin, Total: 2.6 g/dL (ref 1.5–4.5)
Glucose: 202 mg/dL — ABNORMAL HIGH (ref 65–99)
Potassium: 4.6 mmol/L (ref 3.5–5.2)
Sodium: 145 mmol/L — ABNORMAL HIGH (ref 134–144)
Total Protein: 6.4 g/dL (ref 6.0–8.5)

## 2018-10-02 LAB — HEMOGLOBIN A1C
Est. average glucose Bld gHb Est-mCnc: 212 mg/dL
Hgb A1c MFr Bld: 9 % — ABNORMAL HIGH (ref 4.8–5.6)

## 2018-10-02 LAB — URIC ACID: Uric Acid: 10.4 mg/dL — ABNORMAL HIGH (ref 2.5–7.1)

## 2018-10-05 NOTE — Progress Notes (Signed)
Subjective:     Patient ID: Maria [name] , female    DOB: 12/31/1925 , 85 y.o.   MRN: [id number]   Chief Complaint  Patient presents with  . Diabetes  . Hypertension    HPI  Diabetes  She presents for her follow-up diabetic visit. She has type 2 diabetes mellitus. Her disease course has been fluctuating. There are no hypoglycemic associated symptoms. Pertinent negatives for diabetes include no blurred vision and no chest pain. There are no hypoglycemic complications. Diabetic complications include nephropathy. Risk factors for coronary artery disease include diabetes mellitus, dyslipidemia, hypertension, obesity, sedentary lifestyle and post-menopausal.  Hypertension  This is a chronic problem. The current episode started more than 1 year ago. The problem has been gradually improving since onset. The problem is controlled. Pertinent negatives include no blurred vision, chest pain, palpitations or shortness of breath.   She reports compliance with meds.   Past Medical History:  Diagnosis Date  . Anxiety   . Arthritis   . Blood transfusion   . Depression   . Diabetes mellitus   . Edema   . Heart murmur   . Hyperlipidemia   . Hypertension   . Sleep apnea    cpap  . Stroke ([place])    3 x tias  . TIA (transient ischemic attack)      Family History  Problem Relation Age of Onset  . Prostate cancer Other   . Healthy Mother   . Prostate cancer Father      Current Outpatient Medications:  .  ACCU-CHEK FASTCLIX LANCETS MISC, CHECK BLOOD SUGAR BEFORE BREAKFAST AND DINNER dx: e11.65, Disp: 300 each, Rfl: 2 .  ACCU-CHEK SMARTVIEW test strip, U TO CHECK BID BEFORE BRE AND DINNER dx: e11.65, Disp: 300 each, Rfl: 2 .  amLODipine (NORVASC) 5 MG tablet, TAKE ONE-HALF (1/2) TABLET DAILY, Disp: 45 tablet, Rfl: 3 .  aspirin 81 MG tablet, Take 1 tablet (81 mg total) by mouth daily., Disp: , Rfl:  .  atorvastatin (LIPITOR) 40 MG tablet, Take 1 tablet (40 mg total) by mouth daily., Disp:  90 tablet, Rfl: 3 .  Cholecalciferol (VITAMIN D) 2000 UNITS tablet, Take 2,000 Units by mouth daily., Disp: , Rfl:  .  citalopram (CELEXA) 20 MG tablet, Take 20 mg by mouth daily., Disp: , Rfl:  .  docusate sodium (COLACE) 100 MG capsule, Take 100 mg by mouth daily., Disp: , Rfl:  .  donepezil (ARICEPT) 10 MG tablet, Take 1 tablet by mouth daily., Disp: , Rfl:  .  ezetimibe (ZETIA) 10 MG tablet, TAKE 1 TABLET AT BEDTIME, Disp: 90 tablet, Rfl: 3 .  FOLBIC 2.5-25-2 MG TABS tablet, TAKE 1 TABLET DAILY, Disp: 90 tablet, Rfl: 3 .  furosemide (LASIX) 40 MG tablet, TAKE 1 TABLET TWICE A DAY, Disp: 90 tablet, Rfl: 3 .  hydrALAZINE (APRESOLINE) 25 MG tablet, Take 1 tablet (25 mg total) by mouth 3 (three) times daily., Disp: 270 tablet, Rfl: 3 .  Insulin Pen Needle (BD PEN NEEDLE MICRO U/F) 32G X 6 MM MISC, Use as directed, Disp: 300 each, Rfl: 2 .  isosorbide mononitrate (IMDUR) 30 MG 24 hr tablet, Take 1 tablet (30 mg total) by mouth daily., Disp: 90 tablet, Rfl: 3 .  LANTUS SOLOSTAR 100 UNIT/ML Solostar Pen, Inject 14 Units into the skin every evening., Disp: , Rfl:  .  loratadine (CLARITIN) 10 MG tablet, Take 10 mg by mouth every morning., Disp: , Rfl:  .  metolazone (ZAROXOLYN) 2.5 MG  tablet, TAKE 1 TABLET Wednesday AND SATURDAYS 30 minutes prior to your morning lasix dose., Disp: 45 tablet, Rfl: 3 .  potassium chloride (K-DUR,KLOR-CON) 10 MEQ tablet, Take 1 tablet (10 mEq total) by mouth 2 (two) times daily., Disp: 180 tablet, Rfl: 3 .  telmisartan (MICARDIS) 80 MG tablet, TAKE 1 TABLET BY MOUTH DAILY, Disp: 90 tablet, Rfl: 3 .  COLCRYS 0.6 MG tablet, , Disp: , Rfl:  .  predniSONE (DELTASONE) 10 MG tablet, TK 2 T PO FOR 3 DAYS THEN 1 T FOR 3 DAYS, Disp: , Rfl:    Allergies  Allergen Reactions  . Ace Inhibitors Other (See Comments)    Angioedema   . Diovan [Valsartan]      Review of Systems  Constitutional: Negative.   Eyes: Negative for blurred vision.  Respiratory: Negative.  Negative for  shortness of breath.   Cardiovascular: Negative.  Negative for chest pain and palpitations.  Gastrointestinal: Negative.   Musculoskeletal: Positive for arthralgias (r hand pain - had recent gouty attack. thinks she ate shrimp recently).  Neurological: Negative.   Psychiatric/Behavioral: Negative.      Today's Vitals   10/01/18 1017  BP: 122/74  Pulse: 96  Temp: 98 F (36.7 C)  TempSrc: Oral  Weight: 167 lb 3.2 oz (75.8 kg)  Height: _0  (1.6 m)  PainSc: 0-No pain   Body mass index is 29.62 kg/m.   Objective:  Physical Exam Vitals signs and nursing note reviewed.  Constitutional:      Appearance: Normal appearance.  HENT:     Head: Normocephalic and atraumatic.  Cardiovascular:     Rate and Rhythm: Normal rate and regular rhythm.     Heart sounds: Murmur present.  Pulmonary:     Effort: Pulmonary effort is normal.     Breath sounds: Normal breath sounds.  Musculoskeletal:        General: Swelling present.     Comments: Swelling of R MCP joints, no overlying eyrthema  Skin:    General: Skin is warm.  Neurological:     General: No focal deficit present.     Mental Status: She is alert.         Assessment And Plan:     1. Type 2 diabetes mellitus with stage 3 chronic kidney disease, with long-term current use of insulin ([place])  I will check labs as listed below.  She is encouraged to avoid sugary beverages. She is also encouraged to participate in exercise class at her senior center.   - CMP14+EGFR - Hemoglobin A1c  2. Hypertensive nephropathy  Well controlled. She will continue with current meds. She is encouraged to avoid adding salt to her foods.   3. Acute gout due to renal impairment involving right hand  I will check uric acid level today. She is encouraged to avoid shellfish and to stay well hydrated.   - Uric acid        Maximino Greenland, MD

## 2018-10-10 DIAGNOSIS — W19XXXA Unspecified fall, initial encounter: Secondary | ICD-10-CM | POA: Diagnosis not present

## 2018-10-10 DIAGNOSIS — R55 Syncope and collapse: Secondary | ICD-10-CM | POA: Diagnosis not present

## 2018-10-14 DIAGNOSIS — R569 Unspecified convulsions: Secondary | ICD-10-CM | POA: Diagnosis not present

## 2018-10-23 ENCOUNTER — Telehealth: Payer: Self-pay | Admitting: Cardiovascular Disease

## 2018-10-23 NOTE — Telephone Encounter (Signed)
Returned call to patient she stated she has been having swelling in both ankles and sob with exertion for the past 1 week.Stated she has lost 5 to 10 lbs.Stated she goes to center 4 days a week.Her diet has not changed she eats a low salt.She has not missed taking any meds.Appointment scheduled with Jory Sims DNP 10/28/18 at 9:00 am.

## 2018-10-23 NOTE — Telephone Encounter (Signed)
New Message    Pt c/o swelling: STAT is pt has developed SOB within 24 hours  1) How much weight have you gained and in what time span? Patient has lost 10lbs   2) If swelling, where is the swelling located? Ankles of both feet  3) Are you currently taking a fluid pill? Yes    4) Are you currently SOB? No, only when patient is moving around   5) Do you have a log of your daily weights (if so, list)? No  6) Have you gained 3 pounds in a day or 5 pounds in a week? Patient lost weight and it's been since October  7) Have you traveled recently? No

## 2018-10-27 NOTE — Progress Notes (Signed)
Cardiology Office Note   Date:  10/28/2018   ID:  ACADIA THAMMAVONG, DOB Jan 09, 1926, MRN [id number]  PCP:  Glendale Chard, MD  Cardiologist: Dr. Gwenlyn Found Chief Complaint  Patient presents with  . Hypertension  . Edema     History of Present Illness: Maria [name] is a 85 y.o. female who presents for ongoing assessment and management of HTN, HL,  With other hx to include Type II diabetes, and OSA on CPAP. She has normal coronaries per cath in 2012, but was noted to have Grade I diastolic dysfunction.   She was last seen in the office by Dr. Gwenlyn Found on 1 0/30/2019 with complaints of chronic LEE. She continued to struggle with salt restriction. She called our office on 10/23/2018 with complaints of LEE and DOE.    She comes today feeling better. She states that she goes to a senior center 4 days a week. They provide lunch which is catered by [employer]. This is salted food. She noticed that her feet and legs swell when she eats lunch more than twice a week. She stopped going to the center for a week and the swelling went away.   She complains of gout pan in her right hand with nodules noted. She had by on colchicine in the past but goes through her PCP for treatment.   Past Medical History:  Diagnosis Date  . Anxiety   . Arthritis   . Blood transfusion   . Depression   . Diabetes mellitus   . Edema   . Heart murmur   . Hyperlipidemia   . Hypertension   . Sleep apnea    cpap  . Stroke ([place])    3 x tias  . TIA (transient ischemic attack)     Past Surgical History:  Procedure Laterality Date  . ABDOMINAL HYSTERECTOMY    . APPENDECTOMY    . NM MYOVIEW LTD  [id number]   post stress left ventricle is normal in size, ejection fraction is 65%, normal myocardial perfusion study, low risk scan  . PARTIAL HYSTERECTOMY    . TRANSTHORACIC ECHOCARDIOGRAM  [id number]     Current Outpatient Medications  Medication Sig Dispense Refill  . ACCU-CHEK FASTCLIX LANCETS MISC CHECK BLOOD SUGAR  BEFORE BREAKFAST AND DINNER dx: e11.65 300 each 2  . ACCU-CHEK SMARTVIEW test strip U TO CHECK BID BEFORE BRE AND DINNER dx: e11.65 300 each 2  . amLODipine (NORVASC) 5 MG tablet TAKE ONE-HALF (1/2) TABLET DAILY 45 tablet 3  . aspirin 81 MG tablet Take 1 tablet (81 mg total) by mouth daily.    Marland Kitchen atorvastatin (LIPITOR) 40 MG tablet Take 1 tablet (40 mg total) by mouth daily. 90 tablet 3  . Cholecalciferol (VITAMIN D) 2000 UNITS tablet Take 2,000 Units by mouth daily.    . citalopram (CELEXA) 20 MG tablet Take 20 mg by mouth daily.    Marland Kitchen COLCRYS 0.6 MG tablet     . docusate sodium (COLACE) 100 MG capsule Take 100 mg by mouth daily.    Marland Kitchen donepezil (ARICEPT) 10 MG tablet Take 1 tablet by mouth daily.    Marland Kitchen ezetimibe (ZETIA) 10 MG tablet TAKE 1 TABLET AT BEDTIME 90 tablet 3  . FOLBIC 2.5-25-2 MG TABS tablet TAKE 1 TABLET DAILY 90 tablet 3  . furosemide (LASIX) 40 MG tablet TAKE 1 TABLET TWICE A DAY 90 tablet 3  . hydrALAZINE (APRESOLINE) 25 MG tablet Take 1 tablet (25 mg total) by mouth 3 (three) times daily. [place]  tablet 3  . Insulin Pen Needle (BD PEN NEEDLE MICRO U/F) 32G X 6 MM MISC Use as directed 300 each 2  . isosorbide mononitrate (IMDUR) 30 MG 24 hr tablet Take 1 tablet (30 mg total) by mouth daily. 90 tablet 3  . LANTUS SOLOSTAR 100 UNIT/ML Solostar Pen Inject 14 Units into the skin every evening.    . loratadine (CLARITIN) 10 MG tablet Take 10 mg by mouth every morning.    . metolazone (ZAROXOLYN) 2.5 MG tablet TAKE 1 TABLET Wednesday AND SATURDAYS 30 minutes prior to your morning lasix dose. 45 tablet 3  . potassium chloride (K-DUR,KLOR-CON) 10 MEQ tablet Take 1 tablet (10 mEq total) by mouth 2 (two) times daily. 180 tablet 3  . telmisartan (MICARDIS) 80 MG tablet TAKE 1 TABLET BY MOUTH DAILY 90 tablet 3   No current facility-administered medications for this visit.     Allergies:   Ace inhibitors and Diovan [valsartan]    Social History:  The patient  reports that she has never  smoked. She has never used smokeless tobacco. She reports that she does not drink alcohol or use drugs.   Family History:  The patient's family history includes Healthy in her mother; Prostate cancer in her father and another family member.    ROS: All other systems are reviewed and negative. Unless otherwise mentioned in H&P   PHYSICAL EXAM: VS:  BP (!) 114/46   Pulse 85   Ht 5\' 3"  (1.6 m)   Wt 170 lb 6.4 oz (77.3 kg)   SpO2 96%   BMI 30.19 kg/m  , BMI Body mass index is 30.19 kg/m. GEN: Well nourished, well developed, in no acute distress HEENT: normal Neck: no JVD, carotid bruits, or masses Cardiac: RRR; 1/6 systolic murmurs, rubs, or gallops,no edema  Respiratory:  Clear to auscultation bilaterally, normal work of breathing GI: soft, nontender, nondistended, + BS MS: no deformity or atrophy Skin: warm and dry, no rash Neuro:  Strength and sensation are intact Psych: euthymic mood, full affect  EKG: Not completed today   Recent Labs: 06/25/2018: TSH 2.200 10/01/2018: ALT 23; BUN 36; Creatinine, Ser 1.75; Potassium 4.6; Sodium 145    Lipid Panel    Component Value Date/Time   CHOL 134 06/25/2018 1014   TRIG 66 06/25/2018 1014   HDL 67 06/25/2018 1014   CHOLHDL 2.0 06/25/2018 1014   LDLCALC 54 06/25/2018 1014      Wt Readings from Last 3 Encounters:  10/28/18 170 lb 6.4 oz (77.3 kg)  10/01/18 167 lb 3.2 oz (75.8 kg)  07/09/18 172 lb 3.2 oz (78.1 kg)      Other studies Reviewed: Echocardiogram 03-23-2018  Left ventricle: The cavity size was normal. Wall thickness was   normal. Systolic function was vigorous. The estimated ejection   fraction was in the range of 65% to 70%. Wall motion was normal;   there were no regional wall motion abnormalities. Doppler   parameters are consistent with abnormal left ventricular   relaxation (grade 1 diastolic dysfunction). - Aortic valve: Valve area (VTI): 2.42 cm^2. Valve area (Vmax):   2.76 cm^2. Valve area (Vmean): 2.58  cm^2.  NM Stress Test 04/05/2015  The left ventricular ejection fraction is normal (55-65%).  Nuclear stress EF: 67%.  The study is normal.  This is a low risk study.  There is no evidence of ischemia    ASSESSMENT AND PLAN:  1. Chronic LEE: I believe based upon review of her echo results and  her explanation of what she is eating that this is related to salt intake. I have advised her to do her best not to eat the salty foods provided at the senior center, but still attend the activities. I will not change any of her medications at this time. Weight is stable based upon review of previous visits. She does to appear in be in fluid overload at this time.   2. Hypertension: Well controlled on current regimen.She is on amlodipine, which can cause some dependent edema.   3. Gouty Arthritis: She is to see PCP for treatment. She has been on colchicine in the past. Will need to have refills if necessary per PCP  4.Hyperlipidemia: Continue current regimen. I have advised her that the pre[ared foods from [facility] may not be low cholesterol. She should chose her foods wisely.    Current medicines are reviewed at length with the patient today.    Labs/ tests ordered today include: None  Phill Myron. [place] Pugh, ANP, AACC   10/28/2018 12:06 PM    [place] [address] Office [phone] Fax [phone]

## 2018-10-28 ENCOUNTER — Encounter: Payer: Self-pay | Admitting: Adult Health

## 2018-10-28 ENCOUNTER — Ambulatory Visit: Payer: Medicare Other | Admitting: Adult Health

## 2018-10-28 VITALS — BP 114/46 | HR 85 | Ht 63.0 in | Wt 170.4 lb

## 2018-10-28 DIAGNOSIS — E7800 Pure hypercholesterolemia, unspecified: Secondary | ICD-10-CM | POA: Diagnosis not present

## 2018-10-28 DIAGNOSIS — I10 Essential (primary) hypertension: Secondary | ICD-10-CM

## 2018-10-28 DIAGNOSIS — R600 Localized edema: Secondary | ICD-10-CM | POA: Diagnosis not present

## 2018-10-28 NOTE — Patient Instructions (Addendum)
PLEASE FOLLOW LOW SODIUM DIET(ATTACHED);  MAKE SURE TO CALL PCP ABOUT YOUR GOUT Follow-Up: You will need a follow up appointment AS PLANNED September-OCTOBER.  Please call our office 2 months in advance (July 2020) to schedule this appointment.  You may see Quay Burow, MD  or one of the following Advanced Practice Providers on your designated Care Team:  Kerin Ransom, [place]  Roby Lofts, PA-C  Sande Rives, [place]   Medication Instructions:  NO CHANGES- Your physician recommends that you continue on your current medications as directed. Please refer to the Current Medication list given to you today. If you need a refill on your cardiac medications before your next appointment, please call your pharmacy. Labwork: When you have labs (blood work) and your tests are completely normal, you will receive your results ONLY by [place] (if you have MyChart) -OR- A paper copy in the mail.  At [facility], you and your health needs are our priority.  As part of our continuing mission to provide you with exceptional heart care, we have created designated Provider Care Teams.  These Care Teams include your primary Cardiologist (physician) and Advanced Practice Providers (APPs -  Physician Assistants and Nurse Practitioners) who all work together to provide you with the care you need, when you need it.  Thank you for choosing [facility] at [facility]!!      Low-Sodium Eating Plan Sodium, which is an element that makes up salt, helps you maintain a healthy balance of fluids in your body. Too much sodium can increase your blood pressure and cause fluid and waste to be held in your body. Your health care provider or dietitian may recommend following this plan if you have high blood pressure (hypertension), kidney disease, liver disease, or heart failure. Eating less sodium can help lower your blood pressure, reduce swelling, and protect your heart, liver, and kidneys. What are tips for following  this plan? General guidelines  Most people on this plan should limit their sodium intake to 1,500-2,000 mg (milligrams) of sodium each day. Reading food labels   The Nutrition Facts label lists the amount of sodium in one serving of the food. If you eat more than one serving, you must multiply the listed amount of sodium by the number of servings.  Choose foods with less than 140 mg of sodium per serving.  Avoid foods with 300 mg of sodium or more per serving. Shopping  Look for lower-sodium products, often labeled as "low-sodium" or "no salt added."  Always check the sodium content even if foods are labeled as "unsalted" or "no salt added".  Buy fresh foods. ? Avoid canned foods and premade or frozen meals. ? Avoid canned, cured, or processed meats  Buy breads that have less than 80 mg of sodium per slice. Cooking  Eat more home-cooked food and less restaurant, buffet, and fast food.  Avoid adding salt when cooking. Use salt-free seasonings or herbs instead of table salt or sea salt. Check with your health care provider or pharmacist before using salt substitutes.  Cook with plant-based oils, such as canola, sunflower, or olive oil. Meal planning  When eating at a restaurant, ask that your food be prepared with less salt or no salt, if possible.  Avoid foods that contain MSG (monosodium glutamate). MSG is sometimes added to [place] food, bouillon, and some canned foods. What foods are recommended? The items listed may not be a complete list. Talk with your dietitian about what dietary choices are best for  you. Grains Low-sodium cereals, including oats, puffed wheat and rice, and shredded wheat. Low-sodium crackers. Unsalted rice. Unsalted pasta. Low-sodium bread. Whole-grain breads and whole-grain pasta. Vegetables Fresh or frozen vegetables. "No salt added" canned vegetables. "No salt added" tomato sauce and paste. Low-sodium or reduced-sodium tomato and vegetable  juice. Fruits Fresh, frozen, or canned fruit. Fruit juice. Meats and other protein foods Fresh or frozen (no salt added) meat, poultry, seafood, and fish. Low-sodium canned tuna and salmon. Unsalted nuts. Dried peas, beans, and lentils without added salt. Unsalted canned beans. Eggs. Unsalted nut butters. Dairy Milk. Soy milk. Cheese that is naturally low in sodium, such as ricotta cheese, fresh mozzarella, or Swiss cheese Low-sodium or reduced-sodium cheese. Cream cheese. Yogurt. Fats and oils Unsalted butter. Unsalted margarine with no trans fat. Vegetable oils such as canola or olive oils. Seasonings and other foods Fresh and dried herbs and spices. Salt-free seasonings. Low-sodium mustard and ketchup. Sodium-free salad dressing. Sodium-free light mayonnaise. Fresh or refrigerated horseradish. Lemon juice. Vinegar. Homemade, reduced-sodium, or low-sodium soups. Unsalted popcorn and pretzels. Low-salt or salt-free chips. What foods are not recommended? The items listed may not be a complete list. Talk with your dietitian about what dietary choices are best for you. Grains Instant hot cereals. Bread stuffing, pancake, and biscuit mixes. Croutons. Seasoned rice or pasta mixes. Noodle soup cups. Boxed or frozen macaroni and cheese. Regular salted crackers. Self-rising flour. Vegetables Sauerkraut, pickled vegetables, and relishes. Olives. [place] fries. Onion rings. Regular canned vegetables (not low-sodium or reduced-sodium). Regular canned tomato sauce and paste (not low-sodium or reduced-sodium). Regular tomato and vegetable juice (not low-sodium or reduced-sodium). Frozen vegetables in sauces. Meats and other protein foods Meat or fish that is salted, canned, smoked, spiced, or pickled. Bacon, ham, sausage, hotdogs, corned beef, chipped beef, packaged lunch meats, salt pork, jerky, pickled herring, anchovies, regular canned tuna, sardines, salted nuts. Dairy Processed cheese and cheese spreads.  Cheese curds. Blue cheese. Feta cheese. String cheese. Regular cottage cheese. Buttermilk. Canned milk. Fats and oils Salted butter. Regular margarine. Ghee. Bacon fat. Seasonings and other foods Onion salt, garlic salt, seasoned salt, table salt, and sea salt. Canned and packaged gravies. Worcestershire sauce. Tartar sauce. Barbecue sauce. Teriyaki sauce. Soy sauce, including reduced-sodium. Steak sauce. Fish sauce. Oyster sauce. Cocktail sauce. Horseradish that you find on the shelf. Regular ketchup and mustard. Meat flavorings and tenderizers. Bouillon cubes. Hot sauce and Tabasco sauce. Premade or packaged marinades. Premade or packaged taco seasonings. Relishes. Regular salad dressings. Salsa. Potato and tortilla chips. Corn chips and puffs. Salted popcorn and pretzels. Canned or dried soups. Pizza. Frozen entrees and pot pies. Summary  Eating less sodium can help lower your blood pressure, reduce swelling, and protect your heart, liver, and kidneys.  Most people on this plan should limit their sodium intake to 1,500-2,000 mg (milligrams) of sodium each day.  Canned, boxed, and frozen foods are high in sodium. Restaurant foods, fast foods, and pizza are also very high in sodium. You also get sodium by adding salt to food.  Try to cook at home, eat more fresh fruits and vegetables, and eat less fast food, canned, processed, or prepared foods. This information is not intended to replace advice given to you by your health care provider. Make sure you discuss any questions you have with your health care provider. Document Released: 02/16/2002 Document Revised: 08/20/2016 Document Reviewed: 08/20/2016 Elsevier Interactive Patient Education  2019 [employer].

## 2018-11-07 DIAGNOSIS — H18423 Band keratopathy, bilateral: Secondary | ICD-10-CM | POA: Diagnosis not present

## 2018-11-07 DIAGNOSIS — E119 Type 2 diabetes mellitus without complications: Secondary | ICD-10-CM | POA: Diagnosis not present

## 2018-11-07 DIAGNOSIS — H35033 Hypertensive retinopathy, bilateral: Secondary | ICD-10-CM | POA: Diagnosis not present

## 2018-11-07 DIAGNOSIS — E113293 Type 2 diabetes mellitus with mild nonproliferative diabetic retinopathy without macular edema, bilateral: Secondary | ICD-10-CM | POA: Diagnosis not present

## 2018-11-12 ENCOUNTER — Ambulatory Visit: Payer: Medicare PPO

## 2018-11-12 ENCOUNTER — Ambulatory Visit: Payer: Medicare PPO | Admitting: Internal Medicine

## 2018-11-13 DIAGNOSIS — T560X1A Toxic effect of lead and its compounds, accidental (unintentional), initial encounter: Secondary | ICD-10-CM | POA: Diagnosis not present

## 2018-11-20 ENCOUNTER — Other Ambulatory Visit: Payer: Self-pay

## 2018-11-20 ENCOUNTER — Encounter: Payer: Self-pay | Admitting: Internal Medicine

## 2018-11-20 ENCOUNTER — Ambulatory Visit (INDEPENDENT_AMBULATORY_CARE_PROVIDER_SITE_OTHER): Payer: Medicare Other | Admitting: Internal Medicine

## 2018-11-20 VITALS — BP 120/78 | HR 66 | Temp 98.6°F | Ht 63.0 in | Wt 166.4 lb

## 2018-11-20 DIAGNOSIS — N184 Chronic kidney disease, stage 4 (severe): Secondary | ICD-10-CM

## 2018-11-20 DIAGNOSIS — M1A3420 Chronic gout due to renal impairment, left hand, without tophus (tophi): Secondary | ICD-10-CM

## 2018-11-20 MED ORDER — TRIAMCINOLONE ACETONIDE 40 MG/ML IJ SUSP
60.0000 mg | Freq: Once | INTRAMUSCULAR | Status: AC
  Administered 2018-11-20: 60 mg via INTRAMUSCULAR

## 2018-11-20 MED ORDER — COLCRYS 0.6 MG PO TABS: 0.6000 mg | ORAL_TABLET | Freq: Every day | ORAL | 1 refills | Status: DC

## 2018-11-20 NOTE — Patient Instructions (Signed)
ONLY TAKE ATORVASTATIN ON M/W/F--STARTING 3/16   Low-Purine Eating Plan A low-purine eating plan involves making food choices to limit your intake of purine. Purine is a kind of uric acid. Too much uric acid in your blood can cause certain conditions, such as gout and kidney stones. Eating a low-purine diet can help control these conditions. What are tips for following this plan? Reading food labels   Avoid foods with saturated or Trans fat.  Check the ingredient list of grains-based foods, such as bread and cereal, to make sure that they contain whole grains.  Check the ingredient list of sauces or soups to make sure they do not contain meat or fish.  When choosing soft drinks, check the ingredient list to make sure they do not contain high-fructose corn syrup. Shopping  Buy plenty of fresh fruits and vegetables.  Avoid buying canned or fresh fish.  Buy dairy products labeled as low-fat or nonfat.  Avoid buying premade or processed foods. These foods are often high in fat, salt (sodium), and added sugar. Cooking  Use olive oil instead of butter when cooking. Oils like olive oil, canola oil, and sunflower oil contain healthy fats. Meal planning  Learn which foods do or do not affect you. If you find out that a food tends to cause your gout symptoms to flare up, avoid eating that food. You can enjoy foods that do not cause problems. If you have any questions about a food item, talk with your dietitian or health care provider.  Limit foods high in fat, especially saturated fat. Fat makes it harder for your body to get rid of uric acid.  Choose foods that are lower in fat and are lean sources of protein. General guidelines  Limit alcohol intake to no more than 1 drink a day for nonpregnant women and 2 drinks a day for men. One drink equals 12 oz of beer, 5 oz of wine, or 1 oz of hard liquor. Alcohol can affect the way your body gets rid of uric acid.  Drink plenty of water to  keep your urine clear or pale yellow. Fluids can help remove uric acid from your body.  If directed by your health care provider, take a vitamin C supplement.  Work with your health care provider and dietitian to develop a plan to achieve or maintain a healthy weight. Losing weight can help reduce uric acid in your blood. What foods are recommended? The items listed may not be a complete list. Talk with your dietitian about what dietary choices are best for you. Foods low in purines Foods low in purines do not need to be limited. These include:  All fruits.  All low-purine vegetables, pickles, and olives.  Breads, pasta, rice, cornbread, and popcorn. Cake and other baked goods.  All dairy foods.  Eggs, nuts, and nut butters.  Spices and condiments, such as salt, herbs, and vinegar.  Plant oils, butter, and margarine.  Water, sugar-free soft drinks, tea, coffee, and cocoa.  Vegetable-based soups, broths, sauces, and gravies. Foods moderate in purines Foods moderate in purines should be limited to the amounts listed.   cup of asparagus, cauliflower, spinach, mushrooms, or green peas, each day.  2/3 cup uncooked oatmeal, each day.   cup dry wheat bran or wheat germ, each day.  2-3 ounces of meat or poultry, each day.  4-6 ounces of shellfish, such as crab, lobster, oysters, or shrimp, each day.  1 cup cooked beans, peas, or lentils, each day.  Soup, broths, or bouillon made from meat or fish. Limit these foods as much as possible. What foods are not recommended? The items listed may not be a complete list. Talk with your dietitian about what dietary choices are best for you. Limit your intake of foods high in purines, including:  Beer and other alcohol.  Meat-based gravy or sauce.  Canned or fresh fish, such as: ? Anchovies, sardines, herring, and tuna. ? Mussels and scallops. ? Codfish, trout, and haddock.  [place] Salines.  Organ meats, such as: ? Liver or kidney. ?  [place]. ? Sweetbreads (thymus gland or pancreas).  Wild Clinical biochemist.  Yeast or yeast extract supplements.  Drinks sweetened with high-fructose corn syrup. Summary  Eating a low-purine diet can help control conditions caused by too much uric acid in the body, such as gout or kidney stones.  Choose low-purine foods, limit alcohol, and limit foods high in fat.  You will learn over time which foods do or do not affect you. If you find out that a food tends to cause your gout symptoms to flare up, avoid eating that food. This information is not intended to replace advice given to you by your health care provider. Make sure you discuss any questions you have with your health care provider. Document Released: 12/22/2010 Document Revised: 10/10/2016 Document Reviewed: 10/10/2016 Elsevier Interactive Patient Education  2019 [employer].

## 2018-11-21 LAB — ANTINUCLEAR ANTIBODIES, IFA: ANA Titer 1: NEGATIVE

## 2018-11-21 LAB — SEDIMENTATION RATE: Sed Rate: 57 mm/hr — ABNORMAL HIGH (ref 0–40)

## 2018-11-21 LAB — URIC ACID: Uric Acid: 10 mg/dL — ABNORMAL HIGH (ref 2.5–7.1)

## 2018-11-21 LAB — RHEUMATOID FACTOR: Rheumatoid fact SerPl-aCnc: <10 IU/mL (ref 0.0–13.9)

## 2018-11-21 LAB — CYCLIC CITRUL PEPTIDE ANTIBODY, IGG/IGA: Cyclic Citrullin Peptide Ab: 13 units (ref 0–19)

## 2018-11-24 ENCOUNTER — Other Ambulatory Visit: Payer: Self-pay | Admitting: Internal Medicine

## 2018-11-28 DIAGNOSIS — H18423 Band keratopathy, bilateral: Secondary | ICD-10-CM | POA: Diagnosis not present

## 2018-11-28 DIAGNOSIS — H04123 Dry eye syndrome of bilateral lacrimal glands: Secondary | ICD-10-CM | POA: Diagnosis not present

## 2018-11-28 LAB — HM DIABETES EYE EXAM

## 2018-12-05 ENCOUNTER — Ambulatory Visit: Payer: Medicare PPO | Admitting: Podiatry

## 2018-12-11 ENCOUNTER — Other Ambulatory Visit: Payer: Self-pay | Admitting: Cardiovascular Disease

## 2018-12-17 ENCOUNTER — Other Ambulatory Visit: Payer: Self-pay | Admitting: Internal Medicine

## 2018-12-18 ENCOUNTER — Other Ambulatory Visit: Payer: Self-pay

## 2018-12-18 MED ORDER — FUROSEMIDE 40 MG PO TABS: ORAL_TABLET | ORAL | 3 refills | Status: DC

## 2018-12-18 MED ORDER — INSULIN PEN NEEDLE 32G X 6 MM MISC: 2 refills | Status: AC

## 2018-12-18 MED ORDER — ACCU-CHEK SMARTVIEW VI STRP: ORAL_STRIP | 2 refills | Status: DC

## 2018-12-19 ENCOUNTER — Other Ambulatory Visit: Payer: Self-pay | Admitting: Internal Medicine

## 2018-12-20 NOTE — Progress Notes (Addendum)
Subjective:     Patient ID: Maria [name] , female    DOB: Oct 03, 1925 , 85 y.o.   MRN: [id number]   Chief Complaint  Patient presents with  . Gout    HPI  She is here today for f/u gout. She was recently seen by cardiology who noted that she was having a gouty flare. She took colchicine without any issues. She has had some improvement in her symptoms. She is not sure what may have triggered her symptoms. However, does admit to eating shellfish last month.     Past Medical History:  Diagnosis Date  . Anxiety   . Arthritis   . Blood transfusion   . Depression   . Diabetes mellitus   . Edema   . Heart murmur   . Hyperlipidemia   . Hypertension   . Sleep apnea    cpap  . Stroke ([place])    3 x tias  . TIA (transient ischemic attack)      Family History  Problem Relation Age of Onset  . Prostate cancer Other   . Healthy Mother   . Prostate cancer Father      Current Outpatient Medications:  .  ACCU-CHEK FASTCLIX LANCETS MISC, CHECK BLOOD SUGAR BEFORE BREAKFAST AND DINNER dx: e11.65, Disp: 300 each, Rfl: 2 .  amLODipine (NORVASC) 5 MG tablet, TAKE ONE-HALF (1/2) TABLET DAILY, Disp: 45 tablet, Rfl: 3 .  aspirin 81 MG tablet, Take 1 tablet (81 mg total) by mouth daily., Disp: , Rfl:  .  atorvastatin (LIPITOR) 40 MG tablet, Take 1 tablet (40 mg total) by mouth daily., Disp: 90 tablet, Rfl: 3 .  Cholecalciferol (VITAMIN D) 2000 UNITS tablet, Take 2,000 Units by mouth daily., Disp: , Rfl:  .  citalopram (CELEXA) 20 MG tablet, Take 20 mg by mouth daily., Disp: , Rfl:  .  docusate sodium (COLACE) 100 MG capsule, Take 100 mg by mouth daily., Disp: , Rfl:  .  ezetimibe (ZETIA) 10 MG tablet, TAKE 1 TABLET AT BEDTIME, Disp: 90 tablet, Rfl: 3 .  FOLBIC 2.5-25-2 MG TABS tablet, TAKE 1 TABLET DAILY, Disp: 90 tablet, Rfl: 3 .  isosorbide mononitrate (IMDUR) 30 MG 24 hr tablet, Take 1 tablet (30 mg total) by mouth daily., Disp: 90 tablet, Rfl: 3 .  LANTUS SOLOSTAR 100 UNIT/ML Solostar  Pen, Inject 12 Units into the skin every evening. , Disp: , Rfl:  .  loratadine (CLARITIN) 10 MG tablet, Take 10 mg by mouth every morning., Disp: , Rfl:  .  metolazone (ZAROXOLYN) 2.5 MG tablet, TAKE 1 TABLET Wednesday AND SATURDAYS 30 minutes prior to your morning lasix dose., Disp: 45 tablet, Rfl: 3 .  potassium chloride (K-DUR,KLOR-CON) 10 MEQ tablet, Take 1 tablet (10 mEq total) by mouth 2 (two) times daily., Disp: 180 tablet, Rfl: 3 .  telmisartan (MICARDIS) 80 MG tablet, TAKE 1 TABLET BY MOUTH DAILY, Disp: 90 tablet, Rfl: 3 .  ACCU-CHEK SMARTVIEW test strip, U TO CHECK BID BEFORE BRE AND DINNER dx: e11.65, Disp: 300 each, Rfl: 2 .  colchicine 0.6 MG tablet, TAKE 1 TABLET(0.6 MG) BY MOUTH DAILY, Disp: 30 tablet, Rfl: 1 .  donepezil (ARICEPT) 10 MG tablet, TAKE 1 TABLET DAILY IN THE EVENING, Disp: 90 tablet, Rfl: 3 .  furosemide (LASIX) 40 MG tablet, TAKE 1 TABLET TWICE A DAY, Disp: 90 tablet, Rfl: 3 .  hydrALAZINE (APRESOLINE) 25 MG tablet, TAKE 1 TABLET THREE TIMES A DAY, Disp: 270 tablet, Rfl: 3 .  Insulin Pen  Needle (BD PEN NEEDLE MICRO U/F) 32G X 6 MM MISC, Use as directed, Disp: 300 each, Rfl: 2   Allergies  Allergen Reactions  . Ace Inhibitors Other (See Comments)    Angioedema   . Diovan [Valsartan]      Review of Systems  Constitutional: Negative.   Respiratory: Negative.   Cardiovascular: Negative.   Gastrointestinal: Negative.   Neurological: Negative.   Psychiatric/Behavioral: Negative.      Today's Vitals   11/20/18 1510  BP: 120/78  Pulse: 66  Temp: 98.6 F (37 C)  TempSrc: Oral  Weight: 166 lb 6.4 oz (75.5 kg)  Height: 5\' 3"  (1.6 m)  PainSc: 10-Worst pain ever  PainLoc: Hand   Body mass index is 29.48 kg/m.   Objective:  Physical Exam Vitals signs and nursing note reviewed.  Constitutional:      Appearance: Normal appearance.  HENT:     Head: Normocephalic and atraumatic.  Cardiovascular:     Rate and Rhythm: Normal rate and regular rhythm.      Heart sounds: Normal heart sounds.  Pulmonary:     Effort: Pulmonary effort is normal.     Breath sounds: Normal breath sounds.  Musculoskeletal:     Comments: Decreased left hand swelling. She is able to make a fist.  Skin:    General: Skin is warm.  Neurological:     General: No focal deficit present.     Mental Status: She is alert.  Psychiatric:        Mood and Affect: Mood normal.        Behavior: Behavior normal.         Assessment And Plan:     1. 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 She was given triamcinolone 60mg  IMx1. She was also given refill of colchicine to take daily x 30 days. Afterwards, she will take prn. She will let me know if her sx persist.   - Uric acid - ANA, IFA (with reflex) - CYCLIC CITRUL PEPTIDE ANTIBODY, IGG/IGA - Rheumatoid factor - Sedimentation rate - triamcinolone acetonide (KENALOG-40) injection 60 mg  2. Chronic renal disease, stage IV (HCC)  Chronic. She is encouraged to stay well hydrated.   Maximino Greenland, MD    THE PATIENT IS ENCOURAGED TO PRACTICE SOCIAL DISTANCING DUE TO THE COVID-19 PANDEMIC.

## 2018-12-31 ENCOUNTER — Ambulatory Visit (INDEPENDENT_AMBULATORY_CARE_PROVIDER_SITE_OTHER): Payer: Medicare Other

## 2018-12-31 ENCOUNTER — Other Ambulatory Visit: Payer: Self-pay

## 2018-12-31 ENCOUNTER — Encounter: Payer: Medicare Other | Admitting: Internal Medicine

## 2018-12-31 VITALS — BP 138/62 | HR 92 | Temp 98.2°F | Ht 59.4 in | Wt 162.6 lb

## 2018-12-31 DIAGNOSIS — Z0000 Encounter for general adult medical examination without abnormal findings: Secondary | ICD-10-CM | POA: Diagnosis not present

## 2018-12-31 DIAGNOSIS — N183 Chronic kidney disease, stage 3 (moderate): Secondary | ICD-10-CM

## 2018-12-31 DIAGNOSIS — E1122 Type 2 diabetes mellitus with diabetic chronic kidney disease: Secondary | ICD-10-CM

## 2018-12-31 DIAGNOSIS — Z794 Long term (current) use of insulin: Secondary | ICD-10-CM | POA: Diagnosis not present

## 2018-12-31 LAB — POCT URINALYSIS DIPSTICK
Bilirubin, UA: NEGATIVE
Blood, UA: NEGATIVE
Glucose, UA: NEGATIVE
Ketones, UA: NEGATIVE
Leukocytes, UA: NEGATIVE
Nitrite, UA: NEGATIVE
Protein, UA: NEGATIVE
Spec Grav, UA: 1.02 (ref 1.010–1.025)
Urobilinogen, UA: 0.2 E.U./dL
pH, UA: 5.5 (ref 5.0–8.0)

## 2018-12-31 LAB — POCT UA - MICROALBUMIN
Albumin/Creatinine Ratio, Urine, POC: <30
Creatinine, POC: 100 mg/dL
Microalbumin Ur, POC: 30 mg/L

## 2018-12-31 NOTE — Progress Notes (Signed)
Subjective:   ZEYNEP FANTROY is a 85 y.o. female who presents for Medicare Annual (Subsequent) preventive examination.  Review of Systems:  n/a Cardiac Risk Factors include: advanced age (>71men, >70 women);hypertension;sedentary lifestyle;obesity (BMI >30kg/m2)     Objective:     Vitals: BP 138/62 (BP Location: Left Arm, Patient Position: Sitting)   Pulse 92   Temp 98.2 F (36.8 C) (Oral)   Ht 4' 11.4" (1.509 m)   Wt 162 lb 9.6 oz (73.8 kg)   BMI 32.40 kg/m   Body mass index is 32.4 kg/m.  Advanced Directives 12/31/2018 10/02/2016 09/01/2016 03/04/2013 01/31/2012  Does Patient Have a Medical Advance Directive? No Yes No Patient has advance directive, copy not in chart Patient does not have advance directive;Patient would not like information  Type of Advance Directive - [place];Living will - Living will -  Copy of [place] in Chart? - No - copy requested - Copy requested from family -  Would patient like information on creating a medical advance directive? No - Patient declined No - Patient declined - - -  Pre-existing out of facility DNR order (yellow form or pink MOST form) - - - No No    Tobacco Social History   Tobacco Use  Smoking Status Never Smoker  Smokeless Tobacco Never Used     Counseling given: Not Answered   Clinical Intake:  Pre-visit preparation completed: Yes  Pain : No/denies pain Pain Score: 0-No pain     Nutritional Status: BMI > 30  Obese Nutritional Risks: None Diabetes: Yes CBG done?: No Did pt. bring in CBG monitor from home?: No  How often do you need to have someone help you when you read instructions, pamphlets, or other written materials from your doctor or pharmacy?: 1 - Never What is the last grade level you completed in school?: 12th grade  Interpreter Needed?: No  Information entered by :: NAllen LPN  Past Medical History:  Diagnosis Date  . Anxiety   . Arthritis   . Blood  transfusion   . Depression   . Diabetes mellitus   . Edema   . Heart murmur   . Hyperlipidemia   . Hypertension   . Sleep apnea    cpap  . Stroke ([place])    3 x tias  . TIA (transient ischemic attack)    Past Surgical History:  Procedure Laterality Date  . ABDOMINAL HYSTERECTOMY    . APPENDECTOMY    . NM MYOVIEW LTD  [id number]   post stress left ventricle is normal in size, ejection fraction is 65%, normal myocardial perfusion study, low risk scan  . PARTIAL HYSTERECTOMY    . TRANSTHORACIC ECHOCARDIOGRAM  [id number]   Family History  Problem Relation Age of Onset  . Prostate cancer Other   . Healthy Mother   . Prostate cancer Father    Social History   Socioeconomic History  . Marital status: Widowed    Spouse name: Not on file  . Number of children: Not on file  . Years of education: Not on file  . Highest education level: Not on file  Occupational History  . Occupation: retired  Scientific laboratory technician  . Financial resource strain: Not hard at all  . Food insecurity:    Worry: Never true    Inability: Never true  . Transportation needs:    Medical: No    Non-medical: No  Tobacco Use  . Smoking status: Never Smoker  . Smokeless  tobacco: Never Used  Substance and Sexual Activity  . Alcohol use: No  . Drug use: No  . Sexual activity: Not Currently  Lifestyle  . Physical activity:    Days per week: 0 days    Minutes per session: 0 min  . Stress: Not at all  Relationships  . Social connections:    Talks on phone: Not on file    Gets together: Not on file    Attends religious service: Not on file    Active member of club or organization: Not on file    Attends meetings of clubs or organizations: Not on file    Relationship status: Not on file  Other Topics Concern  . Not on file  Social History Narrative  . Not on file    Outpatient Encounter Medications as of 12/31/2018  Medication Sig  . Accu-Chek FastClix Lancets MISC CHECK BLOOD SUGAR BEFORE BREAKFAST AND DINNER   . ACCU-CHEK SMARTVIEW test strip U TO CHECK BID BEFORE BRE AND DINNER dx: e11.65  . amLODipine (NORVASC) 5 MG tablet TAKE ONE-HALF (1/2) TABLET DAILY  . aspirin 81 MG tablet Take 1 tablet (81 mg total) by mouth daily.  Marland Kitchen atorvastatin (LIPITOR) 40 MG tablet Take 1 tablet (40 mg total) by mouth daily.  . Cholecalciferol (VITAMIN D) 2000 UNITS tablet Take 2,000 Units by mouth daily.  . citalopram (CELEXA) 20 MG tablet Take 20 mg by mouth daily.  . colchicine 0.6 MG tablet TAKE 1 TABLET(0.6 MG) BY MOUTH DAILY  . docusate sodium (COLACE) 100 MG capsule Take 100 mg by mouth daily.  Marland Kitchen donepezil (ARICEPT) 10 MG tablet TAKE 1 TABLET DAILY IN THE EVENING  . ezetimibe (ZETIA) 10 MG tablet TAKE 1 TABLET AT BEDTIME  . FOLBIC 2.5-25-2 MG TABS tablet TAKE 1 TABLET DAILY  . furosemide (LASIX) 40 MG tablet TAKE 1 TABLET TWICE A DAY  . hydrALAZINE (APRESOLINE) 25 MG tablet TAKE 1 TABLET THREE TIMES A DAY  . Insulin Pen Needle (BD PEN NEEDLE MICRO U/F) 32G X 6 MM MISC Use as directed  . isosorbide mononitrate (IMDUR) 30 MG 24 hr tablet Take 1 tablet (30 mg total) by mouth daily.  Marland Kitchen LANTUS SOLOSTAR 100 UNIT/ML Solostar Pen Inject 12 Units into the skin every evening.   . loratadine (CLARITIN) 10 MG tablet Take 10 mg by mouth every morning.  . metolazone (ZAROXOLYN) 2.5 MG tablet TAKE 1 TABLET Wednesday AND SATURDAYS 30 minutes prior to your morning lasix dose.  . potassium chloride (K-DUR,KLOR-CON) 10 MEQ tablet Take 1 tablet (10 mEq total) by mouth 2 (two) times daily.  Marland Kitchen telmisartan (MICARDIS) 80 MG tablet TAKE 1 TABLET BY MOUTH DAILY  . [DISCONTINUED] clonazePAM (KLONOPIN) 0.5 MG tablet Take 0.5 mg by mouth at bedtime.   No facility-administered encounter medications on file as of 12/31/2018.     Activities of Daily Living In your present state of health, do you have any difficulty performing the following activities: 12/31/2018  Hearing? N  Vision? Y  Comment blurry in left eye  Difficulty  concentrating or making decisions? N  Walking or climbing stairs? Y  Comment uses walker  Dressing or bathing? Y  Comment has aide that helps  Doing errands, shopping? N  Preparing Food and eating ? N  Using the Toilet? N  In the past six months, have you accidently leaked urine? N  Do you have problems with loss of bowel control? N  Managing your Medications? Y  Comment aide sets up  meds  Managing your Finances? N  Housekeeping or managing your Housekeeping? Y  Comment aide helps with house work  Some recent data might be hidden    Patient Care Team: Glendale Chard, MD as PCP - General (Internal Medicine) Lorretta Harp, MD as PCP - Cardiology (Cardiology) Hayden Pedro, MD as Consulting Physician (Ophthalmology)    Assessment:   This is a routine wellness examination for Sakshi.  Exercise Activities and Dietary recommendations Current Exercise Habits: The patient does not participate in regular exercise at present  Goals    . Patient Stated (pt-stated)     No goals       Fall Risk Fall Risk  12/31/2018 11/20/2018 06/25/2018  Falls in the past year? 0 0 No  Risk for fall due to : Impaired balance/gait;Impaired vision;Medication side effect - -  Follow up Education provided;Falls prevention discussed - -   Is the patient's home free of loose throw rugs in walkways, pet beds, electrical cords, etc?   yes      Grab bars in the bathroom? yes      Handrails on the stairs?   n/a      Adequate lighting?   yes  Timed Get Up and Go performed: n/a  Depression Screen PHQ 2/9 Scores 12/31/2018 11/20/2018 10/01/2018 06/25/2018  PHQ - 2 Score 1 0 6 2  PHQ- 9 Score 7 - 12 3     Cognitive Function     6CIT Screen 12/31/2018  What Year? 0 points  What month? 0 points  What time? 0 points  Count back from 20 0 points  Months in reverse 2 points  Repeat phrase 2 points  Total Score 4    Immunization History  Administered Date(s) Administered  . Influenza, High Dose  Seasonal PF 06/25/2018  . Pneumococcal Conjugate-13 01/26/2015    Qualifies for Shingles Vaccine? yes  Screening Tests Health Maintenance  Topic Date Due  . FOOT EXAM  04/15/1936  . HEMOGLOBIN A1C  04/01/2019  . INFLUENZA VACCINE  04/11/2019  . OPHTHALMOLOGY EXAM  11/28/2019  . TETANUS/TDAP  11/02/2027  . DEXA SCAN  Completed  . PNA vac Low Risk Adult  Completed    Cancer Screenings: Lung: Low Dose CT Chest recommended if Age 56-80 years, 30 pack-year currently smoking OR have quit w/in 15years. Patient does not qualify. Breast:  Up to date on Mammogram? Yes   Up to date of Bone Density/Dexa? Yes Colorectal: not required  Additional Screenings: : Hepatitis C Screening: n/a     Plan:    No goals   I have personally reviewed and noted the following in the patient's chart:   . Medical and social history . Use of alcohol, tobacco or illicit drugs  . Current medications and supplements . Functional ability and status . Nutritional status . Physical activity . Advanced directives . List of other physicians . Hospitalizations, surgeries, and ER visits in previous 12 months . Vitals . Screenings to include cognitive, depression, and falls . Referrals and appointments  In addition, I have reviewed and discussed with patient certain preventive protocols, quality metrics, and best practice recommendations. A written personalized care plan for preventive services as well as general preventive health recommendations were provided to patient.     Kellie Simmering, LPN  12/27/6220

## 2018-12-31 NOTE — Patient Instructions (Signed)
Maria [name] , Thank you for taking time to come for your Medicare Wellness Visit. I appreciate your ongoing commitment to your health goals. Please review the following plan we discussed and let me know if I can assist you in the future.   Screening recommendations/referrals: Colonoscopy: not required Mammogram: not required Bone Density: 01/2017 Recommended yearly ophthalmology/optometry visit for glaucoma screening and checkup Recommended yearly dental visit for hygiene and checkup  Vaccinations: Influenza vaccine: 06/2018 Pneumococcal vaccine: 06/2011 Tdap vaccine: 10/2017 Shingles vaccine: discussed    Advanced directives: Advance directive discussed with you today. Even though you declined this today please call our office should you change your mind and we can give you the proper paperwork for you to fill out.   Conditions/risks identified: Obesity  Next appointment: 03/03/2019 at 10:45   Preventive Care 65 Years and Older, Female Preventive care refers to lifestyle choices and visits with your health care provider that can promote health and wellness. What does preventive care include?  A yearly physical exam. This is also called an annual well check.  Dental exams once or twice a year.  Routine eye exams. Ask your health care provider how often you should have your eyes checked.  Personal lifestyle choices, including:  Daily care of your teeth and gums.  Regular physical activity.  Eating a healthy diet.  Avoiding tobacco and drug use.  Limiting alcohol use.  Practicing safe sex.  Taking low-dose aspirin every day.  Taking vitamin and mineral supplements as recommended by your health care provider. What happens during an annual well check? The services and screenings done by your health care provider during your annual well check will depend on your age, overall health, lifestyle risk factors, and family history of disease. Counseling  Your health care  provider may ask you questions about your:  Alcohol use.  Tobacco use.  Drug use.  Emotional well-being.  Home and relationship well-being.  Sexual activity.  Eating habits.  History of falls.  Memory and ability to understand (cognition).  Work and work Statistician.  Reproductive health. Screening  You may have the following tests or measurements:  Height, weight, and BMI.  Blood pressure.  Lipid and cholesterol levels. These may be checked every 5 years, or more frequently if you are over 79 years old.  Skin check.  Lung cancer screening. You may have this screening every year starting at age 91 if you have a 30-pack-year history of smoking and currently smoke or have quit within the past 15 years.  Fecal occult blood test (FOBT) of the stool. You may have this test every year starting at age 38.  Flexible sigmoidoscopy or colonoscopy. You may have a sigmoidoscopy every 5 years or a colonoscopy every 10 years starting at age 106.  Hepatitis C blood test.  Hepatitis B blood test.  Sexually transmitted disease (STD) testing.  Diabetes screening. This is done by checking your blood sugar (glucose) after you have not eaten for a while (fasting). You may have this done every 1-3 years.  Bone density scan. This is done to screen for osteoporosis. You may have this done starting at age 12.  Mammogram. This may be done every 1-2 years. Talk to your health care provider about how often you should have regular mammograms. Talk with your health care provider about your test results, treatment options, and if necessary, the need for more tests. Vaccines  Your health care provider may recommend certain vaccines, such as:  Influenza vaccine. This is  recommended every year.  Tetanus, diphtheria, and acellular pertussis (Tdap, Td) vaccine. You may need a Td booster every 10 years.  Zoster vaccine. You may need this after age 61.  Pneumococcal 13-valent conjugate (PCV13)  vaccine. One dose is recommended after age 37.  Pneumococcal polysaccharide (PPSV23) vaccine. One dose is recommended after age 42. Talk to your health care provider about which screenings and vaccines you need and how often you need them. This information is not intended to replace advice given to you by your health care provider. Make sure you discuss any questions you have with your health care provider. Document Released: 09/23/2015 Document Revised: 05/16/2016 Document Reviewed: 06/28/2015 Elsevier Interactive Patient Education  2017 [place] Prevention in the Home Falls can cause injuries. They can happen to people of all ages. There are many things you can do to make your home safe and to help prevent falls. What can I do on the outside of my home?  Regularly fix the edges of walkways and driveways and fix any cracks.  Remove anything that might make you trip as you walk through a door, such as a raised step or threshold.  Trim any bushes or trees on the path to your home.  Use bright outdoor lighting.  Clear any walking paths of anything that might make someone trip, such as rocks or tools.  Regularly check to see if handrails are loose or broken. Make sure that both sides of any steps have handrails.  Any raised decks and porches should have guardrails on the edges.  Have any leaves, snow, or ice cleared regularly.  Use sand or salt on walking paths during winter.  Clean up any spills in your garage right away. This includes oil or grease spills. What can I do in the bathroom?  Use night lights.  Install grab bars by the toilet and in the tub and shower. Do not use towel bars as grab bars.  Use non-skid mats or decals in the tub or shower.  If you need to sit down in the shower, use a plastic, non-slip stool.  Keep the floor dry. Clean up any water that spills on the floor as soon as it happens.  Remove soap buildup in the tub or shower regularly.   Attach bath mats securely with double-sided non-slip rug tape.  Do not have throw rugs and other things on the floor that can make you trip. What can I do in the bedroom?  Use night lights.  Make sure that you have a light by your bed that is easy to reach.  Do not use any sheets or blankets that are too big for your bed. They should not hang down onto the floor.  Have a firm chair that has side arms. You can use this for support while you get dressed.  Do not have throw rugs and other things on the floor that can make you trip. What can I do in the kitchen?  Clean up any spills right away.  Avoid walking on wet floors.  Keep items that you use a lot in easy-to-reach places.  If you need to reach something above you, use a strong step stool that has a grab bar.  Keep electrical cords out of the way.  Do not use floor polish or wax that makes floors slippery. If you must use wax, use non-skid floor wax.  Do not have throw rugs and other things on the floor that can make you trip.  What can I do with my stairs?  Do not leave any items on the stairs.  Make sure that there are handrails on both sides of the stairs and use them. Fix handrails that are broken or loose. Make sure that handrails are as long as the stairways.  Check any carpeting to make sure that it is firmly attached to the stairs. Fix any carpet that is loose or worn.  Avoid having throw rugs at the top or bottom of the stairs. If you do have throw rugs, attach them to the floor with carpet tape.  Make sure that you have a light switch at the top of the stairs and the bottom of the stairs. If you do not have them, ask someone to add them for you. What else can I do to help prevent falls?  Wear shoes that:  Do not have high heels.  Have rubber bottoms.  Are comfortable and fit you well.  Are closed at the toe. Do not wear sandals.  If you use a stepladder:  Make sure that it is fully opened. Do not climb  a closed stepladder.  Make sure that both sides of the stepladder are locked into place.  Ask someone to hold it for you, if possible.  Clearly mark and make sure that you can see:  Any grab bars or handrails.  First and last steps.  Where the edge of each step is.  Use tools that help you move around (mobility aids) if they are needed. These include:  Canes.  Walkers.  Scooters.  Crutches.  Turn on the lights when you go into a dark area. Replace any light bulbs as soon as they burn out.  Set up your furniture so you have a clear path. Avoid moving your furniture around.  If any of your floors are uneven, fix them.  If there are any pets around you, be aware of where they are.  Review your medicines with your doctor. Some medicines can make you feel dizzy. This can increase your chance of falling. Ask your doctor what other things that you can do to help prevent falls. This information is not intended to replace advice given to you by your health care provider. Make sure you discuss any questions you have with your health care provider. Document Released: 06/23/2009 Document Revised: 02/02/2016 Document Reviewed: 10/01/2014 Elsevier Interactive Patient Education  2017 [employer].

## 2019-01-09 ENCOUNTER — Other Ambulatory Visit: Payer: Self-pay

## 2019-01-09 ENCOUNTER — Encounter: Payer: Self-pay | Admitting: Internal Medicine

## 2019-01-09 ENCOUNTER — Ambulatory Visit: Payer: Medicare Other | Admitting: Podiatry

## 2019-01-09 ENCOUNTER — Encounter: Payer: Self-pay | Admitting: Podiatry

## 2019-01-09 VITALS — Temp 97.2°F

## 2019-01-09 DIAGNOSIS — B351 Tinea unguium: Secondary | ICD-10-CM | POA: Diagnosis not present

## 2019-01-09 DIAGNOSIS — E1140 Type 2 diabetes mellitus with diabetic neuropathy, unspecified: Secondary | ICD-10-CM

## 2019-01-09 DIAGNOSIS — M79676 Pain in unspecified toe(s): Secondary | ICD-10-CM

## 2019-01-09 DIAGNOSIS — I7389 Other specified peripheral vascular diseases: Secondary | ICD-10-CM

## 2019-01-09 NOTE — Progress Notes (Signed)
Complaint:  Visit Type: Patient returns to my office for continued preventative foot care services. Complaint: Patient states" my nails have grown long and thick and become painful to walk and wear shoes" Patient has been diagnosed with DM with neuropathy. The patient presents for preventative foot care services. No changes to ROS  Podiatric Exam: Vascular: dorsalis pedis and posterior tibial pulses are palpable bilateral. Capillary return is immediate. Temperature gradient is WNL. Skin turgor WNL  Sensorium: Diminished  Semmes Weinstein monofilament test. Normal tactile sensation bilaterally. Nail Exam: Pt has thick disfigured discolored nails with subungual debris noted bilateral entire nail hallux through fifth toenails Ulcer Exam: There is no evidence of ulcer or pre-ulcerative changes or infection. Orthopedic Exam: Muscle tone and strength are WNL. No limitations in general ROM. No crepitus or effusions noted. Foot type and digits show no abnormalities. Bony prominences are unremarkable.Hammer toes  B/L Skin: No Porokeratosis. No infection or ulcers  Diagnosis:  Onychomycosis, , Pain in right toe, pain in left toes  Treatment & Plan Procedures and Treatment: Consent by patient was obtained for treatment procedures.   Debridement of mycotic and hypertrophic toenails, 1 through 5 bilateral and clearing of subungual debris. No ulceration, no infection noted. Patient qualifies for diabetic shoes for DPN and hammer toes  B/l. Return Visit-Office Procedure: Patient instructed to return to the office for a follow up visit 3 months for continued evaluation and treatment.    Gardiner Barefoot DPM

## 2019-01-12 ENCOUNTER — Encounter (INDEPENDENT_AMBULATORY_CARE_PROVIDER_SITE_OTHER): Payer: Medicare Other | Admitting: Ophthalmology

## 2019-01-12 ENCOUNTER — Other Ambulatory Visit: Payer: Self-pay

## 2019-01-12 DIAGNOSIS — H35033 Hypertensive retinopathy, bilateral: Secondary | ICD-10-CM | POA: Diagnosis not present

## 2019-01-12 DIAGNOSIS — I10 Essential (primary) hypertension: Secondary | ICD-10-CM

## 2019-01-12 DIAGNOSIS — E113293 Type 2 diabetes mellitus with mild nonproliferative diabetic retinopathy without macular edema, bilateral: Secondary | ICD-10-CM

## 2019-01-12 DIAGNOSIS — H43813 Vitreous degeneration, bilateral: Secondary | ICD-10-CM

## 2019-01-12 DIAGNOSIS — H353111 Nonexudative age-related macular degeneration, right eye, early dry stage: Secondary | ICD-10-CM

## 2019-01-12 DIAGNOSIS — E11319 Type 2 diabetes mellitus with unspecified diabetic retinopathy without macular edema: Secondary | ICD-10-CM

## 2019-01-12 LAB — HM DIABETES EYE EXAM

## 2019-01-14 ENCOUNTER — Encounter: Payer: Self-pay | Admitting: Internal Medicine

## 2019-01-15 DIAGNOSIS — M79641 Pain in right hand: Secondary | ICD-10-CM | POA: Diagnosis not present

## 2019-01-15 DIAGNOSIS — M79642 Pain in left hand: Secondary | ICD-10-CM | POA: Diagnosis not present

## 2019-01-22 DIAGNOSIS — M79642 Pain in left hand: Secondary | ICD-10-CM | POA: Diagnosis not present

## 2019-01-22 DIAGNOSIS — M79641 Pain in right hand: Secondary | ICD-10-CM | POA: Diagnosis not present

## 2019-01-28 ENCOUNTER — Telehealth: Payer: Self-pay | Admitting: Cardiovascular Disease

## 2019-01-28 DIAGNOSIS — E11319 Type 2 diabetes mellitus with unspecified diabetic retinopathy without macular edema: Secondary | ICD-10-CM | POA: Diagnosis not present

## 2019-01-28 DIAGNOSIS — Z961 Presence of intraocular lens: Secondary | ICD-10-CM | POA: Diagnosis not present

## 2019-01-28 DIAGNOSIS — H18423 Band keratopathy, bilateral: Secondary | ICD-10-CM | POA: Diagnosis not present

## 2019-01-28 NOTE — Telephone Encounter (Signed)
home phone/ consent/ my chart/ pre reg completed

## 2019-01-29 ENCOUNTER — Telehealth: Payer: Self-pay

## 2019-01-29 DIAGNOSIS — M109 Gout, unspecified: Secondary | ICD-10-CM

## 2019-01-29 NOTE — Telephone Encounter (Signed)
I called the pt and told her that Dr. Baird Cancer wants to know if the pt wants to see a rheumatologist for her gout and the pt said yes.

## 2019-01-30 ENCOUNTER — Encounter: Payer: Self-pay | Admitting: Cardiovascular Disease

## 2019-01-30 ENCOUNTER — Telehealth (INDEPENDENT_AMBULATORY_CARE_PROVIDER_SITE_OTHER): Payer: Medicare Other | Admitting: Cardiovascular Disease

## 2019-01-30 ENCOUNTER — Ambulatory Visit: Payer: Medicare Other | Admitting: Cardiovascular Disease

## 2019-01-30 ENCOUNTER — Telehealth: Payer: Self-pay

## 2019-01-30 VITALS — BP 148/75 | HR 76 | Ht 63.0 in | Wt 162.0 lb

## 2019-01-30 DIAGNOSIS — E782 Mixed hyperlipidemia: Secondary | ICD-10-CM

## 2019-01-30 DIAGNOSIS — I5033 Acute on chronic diastolic (congestive) heart failure: Secondary | ICD-10-CM

## 2019-01-30 DIAGNOSIS — Z9989 Dependence on other enabling machines and devices: Secondary | ICD-10-CM

## 2019-01-30 DIAGNOSIS — I110 Hypertensive heart disease with heart failure: Secondary | ICD-10-CM | POA: Diagnosis not present

## 2019-01-30 DIAGNOSIS — R0600 Dyspnea, unspecified: Secondary | ICD-10-CM

## 2019-01-30 DIAGNOSIS — G4733 Obstructive sleep apnea (adult) (pediatric): Secondary | ICD-10-CM

## 2019-01-30 DIAGNOSIS — I5032 Chronic diastolic (congestive) heart failure: Secondary | ICD-10-CM

## 2019-01-30 DIAGNOSIS — R0609 Other forms of dyspnea: Secondary | ICD-10-CM

## 2019-01-30 DIAGNOSIS — I5189 Other ill-defined heart diseases: Secondary | ICD-10-CM

## 2019-01-30 NOTE — Telephone Encounter (Signed)
lmtcb to review AVS from 5/22 Letter including After Visit Summary and any other necessary documents to be mailed to the patient's address on file.

## 2019-01-30 NOTE — Progress Notes (Signed)
Virtual Visit via Telephone Note   This visit type was conducted due to national recommendations for restrictions regarding the COVID-19 Pandemic (e.g. social distancing) in an effort to limit this patient's exposure and mitigate transmission in our community.  Due to her co-morbid illnesses, this patient is at least at moderate risk for complications without adequate follow up.  This format is felt to be most appropriate for this patient at this time.  The patient did not have access to video technology/had technical difficulties with video requiring transitioning to audio format only (telephone).  All issues noted in this document were discussed and addressed.  No physical exam could be performed with this format.  Please refer to the patient's chart for her  consent to telehealth for [facility].   Date:  01/30/2019   ID:  JEFFERY GAMMELL, DOB 1925-12-16, MRN [id number]  Patient Location: Home Provider Location: Home  PCP:  Glendale Chard, MD  Cardiologist:  Quay Burow, MD  Electrophysiologist:  None   Evaluation Performed:  Follow-Up Visit  Chief Complaint: Dyspnea on exertion  History of Present Illness:    Maria [name] is a 85 y.o.  moderately overweight, widowed Caucasian female, mother of 2, grandmother to 2 grandchildren who I last sawin the office 07/09/18.Marland Kitchen Her problems include hypertension, hyperlipidemia, diabetes and obstructive sleep apnea on CPAP. She has normal coronary arteries and normal LV function by cath performed/29/12. She does have diastolic dysfunction. When I saw her back in February she was complaining of dyspnea and I did begin her on Zaroxolyn twice a week which resulted in some improvement in her symptoms as well as in her lower extremity edema, though, today she clearly admits to dietary indiscretion with regards to salt. She was admitted to [facility] for 4 days last month because of diverticulitis. Since her last office visit she continues to  have dietary indiscretion related to salt with significant lower extremity edema and shortness of breath as well as palpitations. She does complain of occasional episodes of substernal chest pain but she has normal coronary arteries by cath which I performed 11/08/2010. Her edema is improved today. She denies chest pain but continues to get some dyspnea on exertion. She walks with a walker.  She has chronic palpitations and had an event monitor performed a year ago that showed sinus rhythm with PVCs. Her last 2D echo performed 02/25/2018 revealed normal LV systolic function, moderate concentric LVH with grade 1 diastolic dysfunction. She does complain of chronic dyspnea which I suspect is related to chronic diastolic heart failure. She also has chronic bilateral lower extremity edema and has admitted to dietary indiscretion with regards to salt in the past. Since I saw her in the office 7 months ago, she is done fairly well except for newly diagnosed gout in both hands which is her major complaint.  She is on prednisone and colchicine.  She is also complained of increasing dyspnea on exertion for the last several months but denies chest pain.  Her last 2D echo performed 02/25/2018 was essentially normal except for diastolic dysfunction.  She is on Lasix and Zaroxolyn.  She denies lower extremity edema in fact, since not going to restaurants, her salt intake is markedly been reduced as has her edema.  The patient does not have symptoms concerning for COVID-19 infection (fever, chills, cough, or new shortness of breath).    Past Medical History:  Diagnosis Date   Anxiety    Arthritis    Blood transfusion  Depression    Diabetes mellitus    Edema    Heart murmur    Hyperlipidemia    Hypertension    Sleep apnea    cpap   Stroke ([place])    3 x tias   TIA (transient ischemic attack)    Past Surgical History:  Procedure Laterality Date   ABDOMINAL HYSTERECTOMY     APPENDECTOMY      NM MYOVIEW LTD  [id number]   post stress left ventricle is normal in size, ejection fraction is 65%, normal myocardial perfusion study, low risk scan   PARTIAL HYSTERECTOMY     TRANSTHORACIC ECHOCARDIOGRAM  [id number]     No outpatient medications have been marked as taking for the 01/30/19 encounter (Appointment) with Lorretta Harp, MD.     Allergies:   Ace inhibitors and Diovan [valsartan]   Social History   Tobacco Use   Smoking status: Never Smoker   Smokeless tobacco: Never Used  Substance Use Topics   Alcohol use: No   Drug use: No     Family Hx: The patient's family history includes Healthy in her mother; Prostate cancer in her father and another family member.  ROS:   Please see the history of present illness.     All other systems reviewed and are negative.   Prior CV studies:   The following studies were reviewed today:  2D echocardiogram performed 02/25/2018  Labs/Other Tests and Data Reviewed:    EKG:  No ECG reviewed.  Recent Labs: 06/25/2018: TSH 2.200 10/01/2018: ALT 23; BUN 36; Creatinine, Ser 1.75; Potassium 4.6; Sodium 145   Recent Lipid Panel Lab Results  Component Value Date/Time   CHOL 134 06/25/2018 10:14 AM   TRIG 66 06/25/2018 10:14 AM   HDL 67 06/25/2018 10:14 AM   CHOLHDL 2.0 06/25/2018 10:14 AM   LDLCALC 54 06/25/2018 10:14 AM    Wt Readings from Last 3 Encounters:  12/31/18 162 lb 9.6 oz (73.8 kg)  11/20/18 166 lb 6.4 oz (75.5 kg)  10/28/18 170 lb 6.4 oz (77.3 kg)     Objective:    Vital Signs:  There were no vitals taken for this visit.   VITAL SIGNS:  reviewed a complete physical exam was not performed since this was a virtual telemedicine phone visit.  ASSESSMENT & PLAN:    1. Dyspnea on exertion- Maria [name] has had several months of increasing dyspnea on exertion.  She does have diastolic dysfunction and is on Lasix and Zaroxolyn although she denies edema and has had decreased salt intake.  I am going to get a  twelve-lead EKG on her in the office to make sure that she is is in sinus rhythm.  We will recheck a 2D echo and I will see her back in 3 months. 2. Essential hypertension- history of essential hypertension with blood pressure measured today by the patient at home of 148/75 with a pulse of 76.  She is on Micardis, amlodipine and hydralazine 3. Hyperlipidemia- history of hyperlipidemia on atorvastatin and Zetia with lipid profile performed 06/25/2018 revealing a total cholesterol 134, LDL 54 and HDL of 67  COVID-19 Education: The signs and symptoms of COVID-19 were discussed with the patient and how to seek care for testing (follow up with PCP or arrange E-visit).  The importance of social distancing was discussed today.  Time:   Today, I have spent 9 minutes with the patient with telehealth technology discussing the above problems.     Medication Adjustments/Labs and Tests  Ordered: Current medicines are reviewed at length with the patient today.  Concerns regarding medicines are outlined above.   Tests Ordered: No orders of the defined types were placed in this encounter.   Medication Changes: No orders of the defined types were placed in this encounter.   Disposition:  Follow up in 3 month(s)  Signed, Quay Burow, MD  01/30/2019 8:02 AM    [place]

## 2019-01-30 NOTE — Addendum Note (Signed)
Addended by: Annita Brod on: 01/30/2019 10:20 AM   Modules accepted: Orders

## 2019-01-30 NOTE — Patient Instructions (Signed)
Medication Instructions:  Your physician recommends that you continue on your current medications as directed. Please refer to the Current Medication list given to you today.  If you need a refill on your cardiac medications before your next appointment, please call your pharmacy.   Lab work: NONE If you have labs (blood work) drawn today and your tests are completely normal, you will receive your results only by: Marland Kitchen MyChart Message (if you have MyChart) OR . A paper copy in the mail If you have any lab test that is abnormal or we need to change your treatment, we will call you to review the results.  Testing/Procedures: Your physician has requested that you have an echocardiogram. Echocardiography is a painless test that uses sound waves to create images of your heart. It provides your doctor with information about the size and shape of your heart and how well your heart's chambers and valves are working. This procedure takes approximately one hour. There are no restrictions for this procedure. LOCATION: [place], [place] [address]  YOU WILL BE CONTACTED BY A SCHEDULER TO SET UP THIS APPOINTMENT    Follow-Up: At [facility], you and your health needs are our priority.  As part of our continuing mission to provide you with exceptional heart care, we have created designated Provider Care Teams.  These Care Teams include your primary Cardiologist (physician) and Advanced Practice Providers (APPs -  Physician Assistants and Nurse Practitioners) who all work together to provide you with the care you need, when you need it. You will need a follow up appointment in 3 months WITH DR. Gwenlyn Found.  Please call our office 2 months in advance to schedule this appointment.   Any Other Special Instructions Will Be Listed Below (If Applicable). Your physician has requested that you have an electrocardiogram (EKG). This is a painless test that records the electrical signals in your heart.  You will be contacted to set up an appointment for this test.

## 2019-02-05 ENCOUNTER — Telehealth: Payer: Self-pay

## 2019-02-05 NOTE — Telephone Encounter (Signed)
Reviewed AVS from 5/22 with pt. Patient and/or DPR-approved person aware of AVS instructions and verbalized understanding.   Pt set up for nurse visit EKG appt on 6/4 at 1000. Pt verbalized understanding and will call transportation services to make them aware

## 2019-02-06 ENCOUNTER — Encounter: Payer: Self-pay | Admitting: Internal Medicine

## 2019-02-12 ENCOUNTER — Other Ambulatory Visit: Payer: Self-pay

## 2019-02-12 ENCOUNTER — Ambulatory Visit (INDEPENDENT_AMBULATORY_CARE_PROVIDER_SITE_OTHER): Payer: Medicare Other | Admitting: *Deleted

## 2019-02-12 VITALS — HR 83

## 2019-02-12 DIAGNOSIS — R0600 Dyspnea, unspecified: Secondary | ICD-10-CM

## 2019-02-12 DIAGNOSIS — R0609 Other forms of dyspnea: Secondary | ICD-10-CM

## 2019-02-12 NOTE — Progress Notes (Signed)
1.) Reason for visit: EKG check for rhythm - see 01/30/19 telemedicine note  2.) Name of MD requesting visit: Dr. Gwenlyn Found  3.) H&P: CHF, DOE, HTN, HLD  4.) ROS related to problem: dyspnea on exertion during 01/30/19 telemedicine visit - MD requested nurse visit EKG check to evaluate rhythm   5.) Assessment and plan per MD: EKG reviewed by Almyra Deforest, PA   -- sinus with PAC and RBBB   -- rate 83 bpm   -- PR 155ms   -- QRS 164ms   -- QT/QTc 396/467ms  -- no changes recommended - patient released to return home   ** note routed to MD and EKG to be scanned to his basket for review as well

## 2019-02-17 ENCOUNTER — Other Ambulatory Visit: Payer: Self-pay

## 2019-02-17 MED ORDER — FUROSEMIDE 40 MG PO TABS: ORAL_TABLET | ORAL | 2 refills | Status: DC

## 2019-02-19 ENCOUNTER — Other Ambulatory Visit: Payer: Self-pay | Admitting: Cardiovascular Disease

## 2019-02-23 DIAGNOSIS — Z9841 Cataract extraction status, right eye: Secondary | ICD-10-CM | POA: Diagnosis not present

## 2019-02-23 DIAGNOSIS — Z961 Presence of intraocular lens: Secondary | ICD-10-CM | POA: Diagnosis not present

## 2019-02-23 DIAGNOSIS — Z9842 Cataract extraction status, left eye: Secondary | ICD-10-CM | POA: Diagnosis not present

## 2019-02-23 DIAGNOSIS — H18423 Band keratopathy, bilateral: Secondary | ICD-10-CM | POA: Diagnosis not present

## 2019-03-02 ENCOUNTER — Other Ambulatory Visit: Payer: Self-pay

## 2019-03-02 DIAGNOSIS — R42 Dizziness and giddiness: Secondary | ICD-10-CM | POA: Diagnosis not present

## 2019-03-02 DIAGNOSIS — I4891 Unspecified atrial fibrillation: Secondary | ICD-10-CM | POA: Diagnosis not present

## 2019-03-02 MED ORDER — ISOSORBIDE MONONITRATE ER 30 MG PO TB24: 30.0000 mg | ORAL_TABLET | Freq: Every day | ORAL | 3 refills | Status: DC

## 2019-03-03 ENCOUNTER — Ambulatory Visit (INDEPENDENT_AMBULATORY_CARE_PROVIDER_SITE_OTHER): Payer: Medicare Other | Admitting: Internal Medicine

## 2019-03-03 ENCOUNTER — Other Ambulatory Visit: Payer: Self-pay

## 2019-03-03 ENCOUNTER — Encounter: Payer: Self-pay | Admitting: Internal Medicine

## 2019-03-03 VITALS — BP 118/70 | HR 84 | Temp 98.4°F | Ht 60.2 in | Wt 159.0 lb

## 2019-03-03 DIAGNOSIS — I129 Hypertensive chronic kidney disease with stage 1 through stage 4 chronic kidney disease, or unspecified chronic kidney disease: Secondary | ICD-10-CM

## 2019-03-03 DIAGNOSIS — Z794 Long term (current) use of insulin: Secondary | ICD-10-CM

## 2019-03-03 DIAGNOSIS — M10042 Idiopathic gout, left hand: Secondary | ICD-10-CM | POA: Diagnosis not present

## 2019-03-03 DIAGNOSIS — N183 Chronic kidney disease, stage 3 (moderate): Secondary | ICD-10-CM | POA: Diagnosis not present

## 2019-03-03 DIAGNOSIS — E6609 Other obesity due to excess calories: Secondary | ICD-10-CM

## 2019-03-03 DIAGNOSIS — Z0000 Encounter for general adult medical examination without abnormal findings: Secondary | ICD-10-CM

## 2019-03-03 DIAGNOSIS — E1122 Type 2 diabetes mellitus with diabetic chronic kidney disease: Secondary | ICD-10-CM

## 2019-03-03 DIAGNOSIS — H6121 Impacted cerumen, right ear: Secondary | ICD-10-CM

## 2019-03-03 DIAGNOSIS — M1A3420 Chronic gout due to renal impairment, left hand, without tophus (tophi): Secondary | ICD-10-CM | POA: Diagnosis not present

## 2019-03-03 DIAGNOSIS — Z6830 Body mass index (BMI) 30.0-30.9, adult: Secondary | ICD-10-CM

## 2019-03-03 LAB — POCT URINALYSIS DIPSTICK
Bilirubin, UA: NEGATIVE
Blood, UA: NEGATIVE
Glucose, UA: NEGATIVE
Ketones, UA: NEGATIVE
Leukocytes, UA: NEGATIVE
Nitrite, UA: NEGATIVE
Protein, UA: NEGATIVE
Spec Grav, UA: 1.015 (ref 1.010–1.025)
Urobilinogen, UA: 0.2 E.U./dL
pH, UA: 6 (ref 5.0–8.0)

## 2019-03-03 LAB — POCT UA - MICROALBUMIN
Albumin/Creatinine Ratio, Urine, POC: <30
Creatinine, POC: 50 mg/dL
Microalbumin Ur, POC: 10 mg/L

## 2019-03-03 MED ORDER — COLCHICINE 0.6 MG PO TABS: 0.6000 mg | ORAL_TABLET | Freq: Every day | ORAL | 1 refills | Status: DC

## 2019-03-03 MED ORDER — TRIAMCINOLONE ACETONIDE 40 MG/ML IJ SUSP
60.0000 mg | Freq: Once | INTRAMUSCULAR | Status: AC
  Administered 2019-03-03: 60 mg via INTRAMUSCULAR

## 2019-03-03 NOTE — Patient Instructions (Signed)

## 2019-03-04 LAB — LIPID PANEL
Chol/HDL Ratio: 2.2 ratio (ref 0.0–4.4)
Cholesterol, Total: 137 mg/dL (ref 100–199)
HDL: 63 mg/dL (ref >39)
LDL Calculated: 60 mg/dL (ref 0–99)
Triglycerides: 69 mg/dL (ref 0–149)
VLDL Cholesterol Cal: 14 mg/dL (ref 5–40)

## 2019-03-04 LAB — HEMOGLOBIN A1C
Est. average glucose Bld gHb Est-mCnc: 146 mg/dL
Hgb A1c MFr Bld: 6.7 % — ABNORMAL HIGH (ref 4.8–5.6)

## 2019-03-04 LAB — CMP14+EGFR
ALT: 15 IU/L (ref 0–32)
AST: 22 IU/L (ref 0–40)
Albumin/Globulin Ratio: 1.5 (ref 1.2–2.2)
Albumin: 3.5 g/dL (ref 3.5–4.6)
Alkaline Phosphatase: 67 IU/L (ref 39–117)
BUN/Creatinine Ratio: 28 (ref 12–28)
BUN: 46 mg/dL — ABNORMAL HIGH (ref 10–36)
Bilirubin Total: 0.2 mg/dL (ref 0.0–1.2)
CO2: 24 mmol/L (ref 20–29)
Calcium: 8.9 mg/dL (ref 8.7–10.3)
Chloride: 100 mmol/L (ref 96–106)
Creatinine, Ser: 1.64 mg/dL — ABNORMAL HIGH (ref 0.57–1.00)
GFR calc Af Amer: 32 mL/min/{1.73_m2} — ABNORMAL LOW (ref >59)
GFR calc non Af Amer: 27 mL/min/{1.73_m2} — ABNORMAL LOW (ref >59)
Globulin, Total: 2.4 g/dL (ref 1.5–4.5)
Glucose: 174 mg/dL — ABNORMAL HIGH (ref 65–99)
Potassium: 4.5 mmol/L (ref 3.5–5.2)
Sodium: 141 mmol/L (ref 134–144)
Total Protein: 5.9 g/dL — ABNORMAL LOW (ref 6.0–8.5)

## 2019-03-04 LAB — CBC
Hematocrit: 28.6 % — ABNORMAL LOW (ref 34.0–46.6)
Hemoglobin: 8.8 g/dL — ABNORMAL LOW (ref 11.1–15.9)
MCH: 26 pg — ABNORMAL LOW (ref 26.6–33.0)
MCHC: 30.8 g/dL — ABNORMAL LOW (ref 31.5–35.7)
MCV: 85 fL (ref 79–97)
Platelets: 221 10*3/uL (ref 150–450)
RBC: 3.38 x10E6/uL — ABNORMAL LOW (ref 3.77–5.28)
RDW: 16.1 % — ABNORMAL HIGH (ref 11.7–15.4)
WBC: 6.2 10*3/uL (ref 3.4–10.8)

## 2019-03-04 LAB — URIC ACID: Uric Acid: 10.8 mg/dL — ABNORMAL HIGH (ref 2.5–7.1)

## 2019-03-08 NOTE — Progress Notes (Signed)
Subjective:     Patient ID: Maria [name] , female    DOB: 27-Aug-1926 , 85 y.o.   MRN: [id number]   Chief Complaint  Patient presents with  . Annual Exam  . Diabetes  . Hypertension    HPI  She is here today for her yearly exam. Unfortunately, she is still having issues with gout.   Diabetes She presents for her follow-up diabetic visit. She has type 2 diabetes mellitus. Her disease course has been fluctuating. There are no hypoglycemic associated symptoms. Pertinent negatives for diabetes include no blurred vision and no chest pain. There are no hypoglycemic complications. Diabetic complications include nephropathy. Risk factors for coronary artery disease include diabetes mellitus, dyslipidemia, hypertension, obesity, sedentary lifestyle and post-menopausal. Current diabetic treatment includes insulin injections. She is compliant with treatment some of the time. Her weight is fluctuating minimally. She is following a diabetic diet. She participates in exercise intermittently. Her home blood glucose trend is increasing steadily. Her breakfast blood glucose is taken between 8-9 am. Her breakfast blood glucose range is generally 140-180 mg/dl. An ACE inhibitor/angiotensin II receptor blocker is being taken.  Hypertension This is a chronic problem. The current episode started more than 1 year ago. The problem has been gradually improving since onset. The problem is controlled. Pertinent negatives include no blurred vision, chest pain, palpitations or shortness of breath. Risk factors for coronary artery disease include diabetes mellitus, dyslipidemia, obesity, post-menopausal state and sedentary lifestyle. Past treatments include angiotensin blockers and diuretics. The current treatment provides moderate improvement.     Past Medical History:  Diagnosis Date  . Anxiety   . Arthritis   . Blood transfusion   . Depression   . Diabetes mellitus   . Edema   . Heart murmur   . Hyperlipidemia    . Hypertension   . Sleep apnea    cpap  . Stroke ([place])    3 x tias  . TIA (transient ischemic attack)      Family History  Problem Relation Age of Onset  . Prostate cancer Other   . Healthy Mother   . Prostate cancer Father      Current Outpatient Medications:  .  Accu-Chek FastClix Lancets MISC, CHECK BLOOD SUGAR BEFORE BREAKFAST AND DINNER, Disp: 306 each, Rfl: 1 .  ACCU-CHEK SMARTVIEW test strip, U TO CHECK BID BEFORE BRE AND DINNER dx: e11.65, Disp: 300 each, Rfl: 2 .  amLODipine (NORVASC) 5 MG tablet, TAKE ONE-HALF (1/2) TABLET DAILY, Disp: 45 tablet, Rfl: 3 .  aspirin 81 MG tablet, Take 1 tablet (81 mg total) by mouth daily., Disp: , Rfl:  .  atorvastatin (LIPITOR) 40 MG tablet, Take 1 tablet (40 mg total) by mouth daily., Disp: 90 tablet, Rfl: 3 .  Cholecalciferol (VITAMIN D) 2000 UNITS tablet, Take 2,000 Units by mouth daily., Disp: , Rfl:  .  citalopram (CELEXA) 20 MG tablet, Take 20 mg by mouth daily., Disp: , Rfl:  .  docusate sodium (COLACE) 100 MG capsule, Take 100 mg by mouth daily., Disp: , Rfl:  .  donepezil (ARICEPT) 10 MG tablet, TAKE 1 TABLET DAILY IN THE EVENING, Disp: 90 tablet, Rfl: 3 .  ezetimibe (ZETIA) 10 MG tablet, TAKE 1 TABLET AT BEDTIME, Disp: 90 tablet, Rfl: 3 .  FOLBIC 2.5-25-2 MG TABS tablet, TAKE 1 TABLET DAILY, Disp: 90 tablet, Rfl: 3 .  furosemide (LASIX) 40 MG tablet, TAKE 1 TABLET TWICE A DAY, Disp: 90 tablet, Rfl: 2 .  hydrALAZINE (APRESOLINE) 25  MG tablet, TAKE 1 TABLET THREE TIMES A DAY, Disp: 270 tablet, Rfl: 3 .  Insulin Pen Needle (BD PEN NEEDLE MICRO U/F) 32G X 6 MM MISC, Use as directed, Disp: 300 each, Rfl: 2 .  isosorbide mononitrate (IMDUR) 30 MG 24 hr tablet, Take 1 tablet (30 mg total) by mouth daily., Disp: 90 tablet, Rfl: 3 .  LANTUS SOLOSTAR 100 UNIT/ML Solostar Pen, Inject 12 Units into the skin every evening. , Disp: , Rfl:  .  loratadine (CLARITIN) 10 MG tablet, Take 10 mg by mouth every morning., Disp: , Rfl:  .  metolazone  (ZAROXOLYN) 2.5 MG tablet, TAKE 1 TABLET Wednesday AND SATURDAYS 30 minutes prior to your morning lasix dose., Disp: 45 tablet, Rfl: 3 .  potassium chloride (K-DUR,KLOR-CON) 10 MEQ tablet, Take 1 tablet (10 mEq total) by mouth 2 (two) times daily., Disp: 180 tablet, Rfl: 3 .  telmisartan (MICARDIS) 80 MG tablet, TAKE 1 TABLET BY MOUTH DAILY, Disp: 90 tablet, Rfl: 3 .  colchicine 0.6 MG tablet, Take 1 tablet (0.6 mg total) by mouth daily., Disp: 30 tablet, Rfl: 1   Allergies  Allergen Reactions  . Ace Inhibitors Other (See Comments)    Angioedema   . Diovan [Valsartan]      The patient states she uses none for birth control. Last LMP was No LMP recorded. Patient has had a hysterectomy.. Negative for Dysmenorrhea '@MAMMOFINDINGS' @. Negative for: breast discharge, breast lump(s), breast pain and breast self exam. Associated symptoms include abnormal vaginal bleeding. Pertinent negatives include abnormal bleeding (hematology), anxiety, decreased libido, depression, difficulty falling sleep, dyspareunia, history of infertility, nocturia, sexual dysfunction, sleep disturbances, urinary incontinence, urinary urgency, vaginal discharge and vaginal itching. Diet regular.The patient states her exercise level is  minimal.  . The patient's tobacco use is:  Social History   Tobacco Use  Smoking Status Never Smoker  Smokeless Tobacco Never Used  . She has been exposed to passive smoke. The patient's alcohol use is:  Social History   Substance and Sexual Activity  Alcohol Use No    Review of Systems  Constitutional: Negative.   HENT: Negative.   Eyes: Negative.  Negative for blurred vision.  Respiratory: Negative.  Negative for shortness of breath.   Cardiovascular: Negative.  Negative for chest pain and palpitations.  Gastrointestinal: Negative.   Endocrine: Negative.   Genitourinary: Negative.   Musculoskeletal: Positive for arthralgias.       Unfortunately, she is still having left hand pain.  She is also followed by Dr Lynann Bologna  Skin: Negative.   Allergic/Immunologic: Negative.   Neurological: Negative.   Hematological: Negative.   Psychiatric/Behavioral: Negative.      Today's Vitals   03/03/19 1026  BP: 118/70  Pulse: 84  Temp: 98.4 F (36.9 C)  TempSrc: Oral  Weight: 159 lb (72.1 kg)  Height: 5' 0.2" (1.529 m)  PainSc: 7   PainLoc: Hand   Body mass index is 30.85 kg/m.   Objective:  Physical Exam Vitals signs and nursing note reviewed.  Constitutional:      Appearance: Normal appearance. She is obese.  HENT:     Head: Normocephalic and atraumatic.     Right Ear: Ear canal and external ear normal. There is impacted cerumen.     Left Ear: Tympanic membrane, ear canal and external ear normal.     Nose: Nose normal.     Mouth/Throat:     Mouth: Mucous membranes are moist.     Pharynx: Oropharynx is clear.  Eyes:  Extraocular Movements: Extraocular movements intact.     Conjunctiva/sclera: Conjunctivae normal.     Pupils: Pupils are equal, round, and reactive to light.  Neck:     Musculoskeletal: Normal range of motion and neck supple.  Cardiovascular:     Rate and Rhythm: Normal rate and regular rhythm.     Pulses: Normal pulses.     Heart sounds: Murmur present.  Pulmonary:     Effort: Pulmonary effort is normal.     Breath sounds: Normal breath sounds.  Chest:     Breasts: Tanner Score is 5.        Right: Normal. No swelling, bleeding, inverted nipple, mass, nipple discharge or skin change.        Left: Normal. No swelling, bleeding, inverted nipple, mass, nipple discharge or skin change.  Abdominal:     General: Bowel sounds are normal.     Palpations: Abdomen is soft.     Comments: obese  Genitourinary:    Comments: deferred Musculoskeletal: Normal range of motion.     Left hand: She exhibits bony tenderness and deformity.     Right lower leg: 1+ Pitting Edema present.     Left lower leg: 1+ Pitting Edema present.     Comments: Left hand  MCP swelling  Skin:    General: Skin is warm and dry.  Neurological:     General: No focal deficit present.     Mental Status: She is alert and oriented to person, place, and time.  Psychiatric:        Mood and Affect: Mood normal.        Behavior: Behavior normal.         Assessment And Plan:     1. Routine general medical examination at health care facility  A full exam was performed. Importance of monthly self breast exams was discussed with the patient.  PATIENT HAS BEEN ADVISED TO GET 30-45 MINUTES REGULAR EXERCISE NO LESS THAN FOUR TO FIVE DAYS PER WEEK - BOTH WEIGHTBEARING EXERCISES AND AEROBIC ARE RECOMMENDED.  SHE IS ADVISED TO FOLLOW A HEALTHY DIET WITH AT LEAST SIX FRUITS/VEGGIES PER DAY, DECREASE INTAKE OF RED MEAT, AND TO INCREASE FISH INTAKE TO TWO DAYS PER WEEK.  MEATS/FISH SHOULD NOT BE FRIED, BAKED OR BROILED IS PREFERABLE.  I SUGGEST WEARING SPF 50 SUNSCREEN ON EXPOSED PARTS AND ESPECIALLY WHEN IN THE DIRECT SUNLIGHT FOR AN EXTENDED PERIOD OF TIME.  PLEASE AVOID FAST FOOD RESTAURANTS AND INCREASE YOUR WATER INTAKE.   2. Type 2 diabetes mellitus with stage 3 chronic kidney disease, with long-term current use of insulin (HCC)  I DISCUSSED WITH THE PATIENT AT LENGTH REGARDING THE GOALS OF GLYCEMIC CONTROL AND POSSIBLE LONG-TERM COMPLICATIONS.  I  ALSO STRESSED THE IMPORTANCE OF COMPLIANCE WITH HOME GLUCOSE MONITORING, DIETARY RESTRICTIONS INCLUDING AVOIDANCE OF SUGARY DRINKS/PROCESSED FOODS,  ALONG WITH REGULAR EXERCISE.  I  ALSO STRESSED THE IMPORTANCE OF ANNUAL EYE EXAMS, SELF FOOT CARE AND COMPLIANCE WITH OFFICE VISITS.  - CMP14+EGFR - CBC - Lipid panel - Hemoglobin A1c - POCT Urinalysis Dipstick (81002) - POCT UA - Microalbumin  3. Hypertensive nephropathy \ Well controlled. She will continue with current meds. She is encouraged to avoid adding salt to her foods. She admits she struggles with this, but it has been easier recently due to COVID-19, she is not eating  out as much. EKG performed, no new changes noted.   - EKG 12-Lead  4. Right ear impacted cerumen  Unfortunately, she could not stay  to have right ear flushed. She will have performed at her next visit.   - Ear Lavage  5. Acute idiopathic gout of left hand  Acute flare of chronic condition. She was given kenalog 31m IM x 1. I will check on Rheum referral. This was placed May 2020.   - Uric acid - triamcinolone acetonide (KENALOG-40) injection 60 mg  6. Class 1 obesity due to excess calories with serious comorbidity and body mass index (BMI) of 30.0 to 30.9 in adult  She is encouraged to incorporate more movement into her daily activities. She is reminded chair exercises do count. Also encouraged to limit her intake of refined carboydrates.   RMaximino Greenland MD    THE PATIENT IS ENCOURAGED TO PRACTICE SOCIAL DISTANCING DUE TO THE COVID-19 PANDEMIC.

## 2019-03-17 NOTE — Progress Notes (Signed)
Office Visit Note  Patient: Maria [name]             Date of Birth: Jun 03, 1926           MRN: [id number]             PCP: Glendale Chard, MD Referring: Glendale Chard, MD Visit Date: 03/26/2019 Occupation: @GUAROCC @  Subjective:  Pain in both hands.   History of Present Illness: Maria [name] is a 85 y.o. female seen in consultation per request of Dr. Baird Cancer.  According to patient she was diagnosed with gout approximately a year ago by Dr. Lynann Bologna.  She states she was started on Colcrys and prednisone and that helped.  She states she gets about 2 episodes per year.  She states recently she had pain and swelling in her left hand.  She was seen by Dr. Baird Cancer who placed her on Colcrys again.  She continues to have pain and discomfort in her bilateral hands.  She has decreased grip strength and has difficulty holding objects.  None of the other joints are painful.  Activities of Daily Living:  Patient reports morning stiffness for 24 hours.   Patient Reports nocturnal pain.  Difficulty dressing/grooming: Denies Difficulty climbing stairs: Reports Difficulty getting out of chair: Denies Difficulty using hands for taps, buttons, cutlery, and/or writing: Reports  Review of Systems  Constitutional: Positive for appetite change, fatigue and weight loss.  HENT: Negative for mouth sores, mouth dryness and nose dryness.   Eyes: Negative for pain, itching and dryness.  Respiratory: Negative for shortness of breath, wheezing and difficulty breathing.   Cardiovascular: Negative for chest pain and swelling in legs/feet.  Gastrointestinal: Negative for constipation and diarrhea.  Endocrine: Negative for increased urination.  Genitourinary: Negative for painful urination and pelvic pain.  Musculoskeletal: Positive for arthralgias, joint pain, joint swelling and morning stiffness.  Skin: Negative for rash and redness.  Allergic/Immunologic: Negative for susceptible to infections.   Neurological: Positive for weakness. Negative for dizziness, light-headedness, numbness and headaches.  Hematological: Negative for swollen glands.  Psychiatric/Behavioral: The patient is not nervous/anxious.     PMFS History:  Patient Active Problem List   Diagnosis Date Noted  . Acute on chronic diastolic CHF (congestive heart failure) ([place]) 03/26/2018  . Acute on chronic renal insufficiency 03/26/2018  . Dyspnea on exertion 02/14/2018  . Palpitations 02/13/2017  . Chest pain 03/25/2015  . Acute diverticulitis 03/04/2013  . Bilateral lower extremity edema 01/23/2013  . Hyperlipidemia 01/23/2013  . Diverticulitis 01/31/2012  . Diabetes mellitus ([place]) 01/31/2012  . Hypertensive cardiovascular disease 01/31/2012  . Obstructive sleep apnea on CPAP 01/31/2012  . History of TIAs 01/31/2012  . Obesity 01/31/2012  . H/O vertigo 01/31/2012    Past Medical History:  Diagnosis Date  . Anxiety   . Arthritis   . Blood transfusion   . Depression   . Diabetes mellitus   . Edema   . Heart murmur   . Hyperlipidemia   . Hypertension   . Sleep apnea    cpap  . Stroke ([place])    3 x tias  . TIA (transient ischemic attack)     Family History  Problem Relation Age of Onset  . Prostate cancer Other   . Healthy Mother   . Prostate cancer Father    Past Surgical History:  Procedure Laterality Date  . ABDOMINAL HYSTERECTOMY    . APPENDECTOMY    . NM MYOVIEW LTD  [id number]   post stress left ventricle  is normal in size, ejection fraction is 65%, normal myocardial perfusion study, low risk scan  . PARTIAL HYSTERECTOMY    . TRANSTHORACIC ECHOCARDIOGRAM  [id number]   Social History   Social History Narrative  . Not on file   Immunization History  Administered Date(s) Administered  . Influenza, High Dose Seasonal PF 06/25/2018  . Pneumococcal Conjugate-13 01/26/2015     Objective: Vital Signs: BP 124/68 (BP Location: Right Wrist, Patient Position: Sitting, Cuff Size: Normal)   Pulse  88   Resp 13   Ht 5' 0.5" (1.537 m)   Wt 157 lb (71.2 kg)   BMI 30.16 kg/m    Physical Exam Vitals signs and nursing note reviewed.  Constitutional:      Appearance: She is well-developed.  HENT:     Head: Normocephalic and atraumatic.  Eyes:     Conjunctiva/sclera: Conjunctivae normal.  Neck:     Musculoskeletal: Normal range of motion.  Cardiovascular:     Rate and Rhythm: Normal rate and regular rhythm.     Heart sounds: Normal heart sounds.  Pulmonary:     Effort: Pulmonary effort is normal.     Breath sounds: Normal breath sounds.  Abdominal:     General: Bowel sounds are normal.     Palpations: Abdomen is soft.  Lymphadenopathy:     Cervical: No cervical adenopathy.  Skin:    General: Skin is warm and dry.     Capillary Refill: Capillary refill takes less than 2 seconds.  Neurological:     Mental Status: She is alert and oriented to person, place, and time.  Psychiatric:        Behavior: Behavior normal.      Musculoskeletal Exam: C-spine limited ROM with lateral rotation. Thoracic kyphosis noted. No midline spinal tenderness.  Difficult to assess lumbar ROM in seated position. Shoulder joints, elbow joints, wrist joints, MCPs, PIPs, and DIPs good ROM with no synovitis.  CMC joint synovial thickening. Full ROM of both wrists with discomfort.  No tenderness of wrist joints.   PIP and DIP synovial thickening.  Right 2nd, 3rd, and 4th MCP joint synovial thickening and tenderness.  Left 2nd and 3rd MCP tenderness and synovitis. Bilateral knee joints have good ROM.  Warmth of both knee joints but no effusion noted.  Tenderness of both ankle joints.    CDAI Exam: CDAI Score: 8.6  Patient Global: 8 mm; Provider Global: 8 mm Swollen: 2 ; Tender: 5  Joint Exam      Right  Left  MCP 2   Tender  Swollen Tender  MCP 3   Tender  Swollen Tender  MCP 4   Tender        Investigation: Findings:  Uric acid 10/01/18: 10.4, 11/20/18: 10.0, 03/03/19: 10.8  11/20/18: ANA negative,  CCP 13, RF<10, sed rate 57  Component     Latest Ref Rng & Units 10/01/2018 11/20/2018  Uric Acid     2.5 - 7.1 mg/dL 10.4 (H) 10.0 (H)  ANA Titer 1       Negative  Cyclic Citrullin Peptide Ab     0 - 19 units  13  RA Latex Turbid.     0.0 - 13.9 IU/mL  <10.0  Sed Rate     0 - 40 mm/hr  57 (H)   Imaging: Xr Foot 2 Views Left  Result Date: 03/26/2019 First MTP, PIP and DIP narrowing was noted.  Generalized osteopenia was noted.  No erosive changes were noted.  Some  cystic changes were noted in the MTPs.  Intertarsal joint narrowing and dorsal spurring was noted.  Tibiotalar joint space narrowing was noted.  Posterior calcaneal spurs were noted.  Vascular calcification was noted. Impression: These findings were consistent with osteoarthritis and possible inflammatory arthritis.  Xr Foot 2 Views Right  Result Date: 03/26/2019 Severe first MTP, PIP and DIP narrowing was noted.  Metatarsal tarsal joint space narrowing was noted.  Possible cystic versus erosive changes were noted at the base of the metatarsals.  Metatarsal tarsal joint space narrowing with spurring was noted.  Narrowing of the tibiotalar space was noted.  Calcaneal spurs were noted. Impression: These findings are consistent with osteoarthritis and possible inflammatory arthritis.  Xr Hand 2 View Left  Result Date: 03/26/2019 Severe CMC narrowing was noted.  Severe PIP and DIP narrowing was noted.  Severe narrowing of second third fourth and fifth MCP joints was noted.  Cystic changes were noted in the third MCP joint.  Intercarpal and radiocarpal joint space narrowing was noted. Impression: These findings are consistent with severe osteoarthritis and inflammatory arthritis.  Xr Hand 2 View Right  Result Date: 03/26/2019 Severe PIP and DIP joint space narrowing was noted.  Narrowing of almost all MCP joints was noted.  Possible cystic and erosive change were noted in the second MCP joint.  Erosive changes noted in the second  DIP.  Severe CMC narrowing was noted.  Intercarpal and radiocarpal joint space narrowing was noted.  Generalized osteopenia was noted. Impression: These findings are consistent with osteoarthritis and inflammatory arthritis which could be due to gouty arthropathy or other inflammatory arthropathy.  Xr Knee 3 View Left  Result Date: 03/26/2019 Severe lateral compartment narrowing was noted.  Moderate to severe medial compartment narrowing was noted.  Moderate patellofemoral narrowing was noted.  No chondrocalcinosis was noted.  Vascular calcification was noted. Impression: These findings are consistent with severe arthritis of the knee joint and moderate chondromalacia patella.  Xr Knee 3 View Right  Result Date: 03/26/2019 Severe medial compartment narrowing and lateral compartment narrowing was noted.  Lateral osteophytes were noted.  No chondrocalcinosis was noted.  Severe patellofemoral narrowing was noted.  Vascular calcification was noted. Impression: These findings are consistent with severe arthritis of the knee joint most likely inflammatory arthritis due to bilateral compartmental narrowing.  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   Recent Labs: Lab Results  Component Value Date   WBC 6.2 03/03/2019   HGB 8.8 (L) 03/03/2019   PLT 221 03/03/2019   NA 141 03/03/2019   K 4.5 03/03/2019   CL 100 03/03/2019   CO2 24 03/03/2019   GLUCOSE 174 (H) 03/03/2019   BUN 46 (H) 03/03/2019   CREATININE 1.64 (H) 03/03/2019   BILITOT 0.2 03/03/2019   ALKPHOS 67 03/03/2019   AST 22 03/03/2019   ALT 15 03/03/2019   PROT 5.9 (L) 03/03/2019   ALBUMIN 3.5 03/03/2019   CALCIUM 8.9 03/03/2019   GFRAA 32 (L) 03/03/2019    Speciality Comments: No specialty comments available.  Procedures:  No procedures performed Allergies: Ace inhibitors and Valsartan   Assessment / Plan:     Visit Diagnoses:  1. History of gout -patient gives history of longstanding gout.  On chart review her  uric acid was elevated.  Her gout does not seem to be very well controlled.  She is currently not taking any allopurinol.  She is on colchicine.  She has severe arthritis in her hands today. Hand x-rays reveal severe arthritis  which could be due to gout or other illness like rheumatoid arthritis.  I would like to place her on a prednisone taper starting at 20 mg and then taper by 5 mg every 4 days.  That will give her some immediate relief.  Hopefully we should have results back in couple of weeks and we should modify treatment accordingly.  I would also like to add allopurinol 100 mg p.o. daily once the acute episode is over.   2. Acute on chronic renal insufficiency -she has chronic renal insufficiency with GFR of 32.  She is also on diuretics which makes the treatment difficult.   3. History of diverticulitis    4. Obstructive sleep apnea on CPAP    5. Hypertensive heart disease with chronic diastolic congestive heart failure ([place])    6. History of TIAs    7. History of diabetes mellitus, type II    8. Palpitations    9. History of hyperlipidemia    10. Pain in both hands -she has synovitis over bilateral MCP joints.  These findings could be very likely due to rheumatoid arthritis.  We will obtain labs today.  Once the labs are available we can make the determination regarding the treatment.   11. Pain in both feet -she is severe arthritis in her feet which appears to be also due to inflammatory arthritis.   12. Chronic pain of both knees -she has warmth on palpation of her bilateral knee joints.  She has severe end-stage arthritis in her bilateral knee joints most likely inflammatory arthritis that she has both medial lateral compartment involvement.   13. High risk medication use -in anticipation of treatment we will obtain some labs today.   14. Other fatigue      Orders: Orders Placed This Encounter  Procedures  . XR Hand 2 View Left  . XR Hand 2 View Right  . XR Foot 2  Views Left  . XR Foot 2 Views Right  . XR KNEE 3 VIEW RIGHT  . XR KNEE 3 VIEW LEFT  . Cyclic citrul peptide antibody, IgG  . 14-3-3 eta Protein  . Rheumatoid factor  . Sedimentation rate  . Uric acid  . Glucose 6 phosphate dehydrogenase   Meds ordered this encounter  Medications  . predniSONE (DELTASONE) 5 MG tablet    Sig: Take 4 tabs po x 4 days, 3  tabs po x 4 days, 2  tabs po x 4 days, 1  tab po x 4 days    Dispense:  40 tablet    Refill:  0     Follow-Up Instructions: Return in about 2 weeks (around 04/09/2019) for Gout, Osteoarthritis, inflammatory arthritis.   Bo Merino, MD  Note - This record has been created using Editor, commissioning.  Chart creation errors have been sought, but may not always  have been located. Such creation errors do not reflect on  the standard of medical care.

## 2019-03-20 ENCOUNTER — Encounter: Payer: Self-pay | Admitting: Internal Medicine

## 2019-03-20 ENCOUNTER — Encounter (HOSPITAL_COMMUNITY): Payer: Self-pay | Admitting: Cardiovascular Disease

## 2019-03-25 ENCOUNTER — Other Ambulatory Visit: Payer: Self-pay

## 2019-03-25 ENCOUNTER — Other Ambulatory Visit: Payer: Medicare Other | Admitting: Orthotics

## 2019-03-25 ENCOUNTER — Ambulatory Visit: Payer: Medicare Other | Admitting: Podiatry

## 2019-03-25 ENCOUNTER — Encounter: Payer: Self-pay | Admitting: Podiatry

## 2019-03-25 VITALS — Temp 98.3°F

## 2019-03-25 DIAGNOSIS — I7389 Other specified peripheral vascular diseases: Secondary | ICD-10-CM | POA: Diagnosis not present

## 2019-03-25 DIAGNOSIS — B351 Tinea unguium: Secondary | ICD-10-CM

## 2019-03-25 DIAGNOSIS — E1140 Type 2 diabetes mellitus with diabetic neuropathy, unspecified: Secondary | ICD-10-CM

## 2019-03-25 DIAGNOSIS — M79676 Pain in unspecified toe(s): Secondary | ICD-10-CM | POA: Diagnosis not present

## 2019-03-25 NOTE — Progress Notes (Signed)
Complaint:  Visit Type: Patient returns to my office for continued preventative foot care services. Complaint: Patient states" my nails have grown long and thick and become painful to walk and wear shoes" Patient has been diagnosed with DM with neuropathy. The patient presents for preventative foot care services. No changes to ROS.  Severe pain 2,3 right foot.  Podiatric Exam: Vascular: dorsalis pedis and posterior tibial pulses are palpable bilateral. Capillary return is immediate. Temperature gradient is WNL. Skin turgor WNL  Sensorium: Diminished  Semmes Weinstein monofilament test. Normal tactile sensation bilaterally. Nail Exam: Pt has thick disfigured discolored nails with subungual debris noted bilateral entire nail hallux through fifth toenails Ulcer Exam: There is no evidence of ulcer or pre-ulcerative changes or infection. Orthopedic Exam: Muscle tone and strength are WNL. No limitations in general ROM. No crepitus or effusions noted. Foot type and digits show no abnormalities. Bony prominences are unremarkable  .Hammer toes  B/L Skin: No Porokeratosis. No infection or ulcers  Diagnosis:  Onychomycosis, , Pain in right toe, pain in left toes  Treatment & Plan Procedures and Treatment: Consent by patient was obtained for treatment procedures.   Debridement of mycotic and hypertrophic toenails, 1 through 5 bilateral and clearing of subungual debris. No ulceration, no infection noted. Patient qualifies for diabetic shoes for DPN and hammer toes  B/l. Patient is seen by [facility] Return Visit-Office Procedure: Patient instructed to return to the office for a follow up visit 10 weeks.   for continued evaluation and treatment.    Gardiner Barefoot DPM

## 2019-03-26 ENCOUNTER — Ambulatory Visit: Payer: Self-pay

## 2019-03-26 ENCOUNTER — Encounter: Payer: Self-pay | Admitting: Rheumatology

## 2019-03-26 ENCOUNTER — Ambulatory Visit: Payer: Medicare Other | Admitting: Rheumatology

## 2019-03-26 VITALS — BP 124/68 | HR 88 | Resp 13 | Ht 60.5 in | Wt 157.0 lb

## 2019-03-26 DIAGNOSIS — M79641 Pain in right hand: Secondary | ICD-10-CM | POA: Diagnosis not present

## 2019-03-26 DIAGNOSIS — Z79899 Other long term (current) drug therapy: Secondary | ICD-10-CM | POA: Diagnosis not present

## 2019-03-26 DIAGNOSIS — I110 Hypertensive heart disease with heart failure: Secondary | ICD-10-CM

## 2019-03-26 DIAGNOSIS — G4733 Obstructive sleep apnea (adult) (pediatric): Secondary | ICD-10-CM | POA: Diagnosis not present

## 2019-03-26 DIAGNOSIS — Z8673 Personal history of transient ischemic attack (TIA), and cerebral infarction without residual deficits: Secondary | ICD-10-CM

## 2019-03-26 DIAGNOSIS — R5383 Other fatigue: Secondary | ICD-10-CM | POA: Diagnosis not present

## 2019-03-26 DIAGNOSIS — M79671 Pain in right foot: Secondary | ICD-10-CM | POA: Diagnosis not present

## 2019-03-26 DIAGNOSIS — I5032 Chronic diastolic (congestive) heart failure: Secondary | ICD-10-CM

## 2019-03-26 DIAGNOSIS — G8929 Other chronic pain: Secondary | ICD-10-CM

## 2019-03-26 DIAGNOSIS — N289 Disorder of kidney and ureter, unspecified: Secondary | ICD-10-CM

## 2019-03-26 DIAGNOSIS — Z9989 Dependence on other enabling machines and devices: Secondary | ICD-10-CM

## 2019-03-26 DIAGNOSIS — M79672 Pain in left foot: Secondary | ICD-10-CM | POA: Diagnosis not present

## 2019-03-26 DIAGNOSIS — Z8739 Personal history of other diseases of the musculoskeletal system and connective tissue: Secondary | ICD-10-CM

## 2019-03-26 DIAGNOSIS — M25562 Pain in left knee: Secondary | ICD-10-CM | POA: Diagnosis not present

## 2019-03-26 DIAGNOSIS — N189 Chronic kidney disease, unspecified: Secondary | ICD-10-CM

## 2019-03-26 DIAGNOSIS — M79642 Pain in left hand: Secondary | ICD-10-CM

## 2019-03-26 DIAGNOSIS — Z8639 Personal history of other endocrine, nutritional and metabolic disease: Secondary | ICD-10-CM

## 2019-03-26 DIAGNOSIS — R002 Palpitations: Secondary | ICD-10-CM

## 2019-03-26 DIAGNOSIS — M25561 Pain in right knee: Secondary | ICD-10-CM

## 2019-03-26 DIAGNOSIS — Z8719 Personal history of other diseases of the digestive system: Secondary | ICD-10-CM | POA: Diagnosis not present

## 2019-03-26 MED ORDER — PREDNISONE 5 MG PO TABS: ORAL_TABLET | ORAL | 0 refills | Status: DC

## 2019-03-27 ENCOUNTER — Other Ambulatory Visit: Payer: Self-pay | Admitting: Internal Medicine

## 2019-03-30 ENCOUNTER — Other Ambulatory Visit: Payer: Self-pay | Admitting: Cardiovascular Disease

## 2019-03-30 NOTE — Progress Notes (Signed)
Uric acid is elevated-10.2.  She will need to be started on Allopurinol at her new patient follow up visit on 04/10/19.    Sed rate WNL.  RF negative.  CCP negative.  G6PD WNL.

## 2019-03-31 LAB — RHEUMATOID FACTOR: Rheumatoid fact SerPl-aCnc: <14 IU/mL (ref <14)

## 2019-03-31 LAB — URIC ACID: Uric Acid, Serum: 10.2 mg/dL — ABNORMAL HIGH (ref 2.5–7.0)

## 2019-03-31 LAB — CYCLIC CITRUL PEPTIDE ANTIBODY, IGG: Cyclic Citrullin Peptide Ab: <16 UNITS

## 2019-03-31 LAB — GLUCOSE 6 PHOSPHATE DEHYDROGENASE: G-6PDH: 16.1 U/g Hgb (ref 7.0–20.5)

## 2019-03-31 LAB — 14-3-3 ETA PROTEIN: 14-3-3 eta Protein: <0.2 ng/mL (ref <0.2)

## 2019-03-31 LAB — SEDIMENTATION RATE: Sed Rate: 28 mm/h (ref 0–30)

## 2019-04-01 NOTE — Progress Notes (Signed)
14-3-3 eta negative.

## 2019-04-07 ENCOUNTER — Other Ambulatory Visit: Payer: Self-pay | Admitting: Cardiovascular Disease

## 2019-04-07 NOTE — Progress Notes (Signed)
Office Visit Note  Patient: Maria [name]             Date of Birth: Jan 20, 1926           MRN: [id number]             PCP: Glendale Chard, MD Referring: Glendale Chard, MD Visit Date: 04/10/2019 Occupation: '@GUAROCC' @  Subjective:  Pain in both hands   History of Present Illness: Maria [name] is a 85 y.o. female with history of gouty arthropathy.  She states she has been having discomfort in her hands on a regular basis.  She has noticed improvement with prednisone.  Although the prednisone caused her blood sugar to go up.  None of the other joints are as painful.  She states she has been taking colchicine on a regular basis.  Activities of Daily Living:  Patient reports morning stiffness for 24 hours.   Patient Reports nocturnal pain.  Difficulty dressing/grooming: Reports Difficulty climbing stairs: Reports Difficulty getting out of chair: Denies Difficulty using hands for taps, buttons, cutlery, and/or writing: Reports  Review of Systems  Constitutional: Positive for fatigue. Negative for night sweats, weight gain and weight loss.  HENT: Negative for mouth sores, trouble swallowing, trouble swallowing, mouth dryness and nose dryness.   Eyes: Negative for pain, redness, visual disturbance and dryness.  Respiratory: Negative for cough, shortness of breath and difficulty breathing.   Cardiovascular: Negative for chest pain, palpitations, hypertension, irregular heartbeat and swelling in legs/feet.  Gastrointestinal: Negative for blood in stool, constipation and diarrhea.  Endocrine: Negative for increased urination.  Genitourinary: Negative for painful urination and vaginal dryness.  Musculoskeletal: Positive for arthralgias, joint pain, joint swelling and morning stiffness. Negative for myalgias, muscle weakness, muscle tenderness and myalgias.  Skin: Negative for color change, rash, hair loss, skin tightness, ulcers and sensitivity to sunlight.  Allergic/Immunologic:  Negative for susceptible to infections.  Neurological: Positive for weakness. Negative for dizziness, numbness, memory loss and night sweats.  Hematological: Negative for bruising/bleeding tendency and swollen glands.  Psychiatric/Behavioral: Negative for depressed mood and sleep disturbance. The patient is not nervous/anxious.     PMFS History:  Patient Active Problem List   Diagnosis Date Noted   Acute on chronic diastolic CHF (congestive heart failure) ([place]) 03/26/2018   Acute on chronic renal insufficiency 03/26/2018   Dyspnea on exertion 02/14/2018   Palpitations 02/13/2017   Chest pain 03/25/2015   Acute diverticulitis 03/04/2013   Bilateral lower extremity edema 01/23/2013   Hyperlipidemia 01/23/2013   Diverticulitis 01/31/2012   Diabetes mellitus ([place]) 01/31/2012   Hypertensive cardiovascular disease 01/31/2012   Obstructive sleep apnea on CPAP 01/31/2012   History of TIAs 01/31/2012   Obesity 01/31/2012   H/O vertigo 01/31/2012    Past Medical History:  Diagnosis Date   Anxiety    Arthritis    Blood transfusion    Depression    Diabetes mellitus    Edema    Heart murmur    Hyperlipidemia    Hypertension    Sleep apnea    cpap   Stroke ([place])    3 x tias   TIA (transient ischemic attack)     Family History  Problem Relation Age of Onset   Prostate cancer Other    Healthy Mother    Prostate cancer Father    Past Surgical History:  Procedure Laterality Date   ABDOMINAL HYSTERECTOMY     APPENDECTOMY     NM MYOVIEW LTD  [id number]   post stress  left ventricle is normal in size, ejection fraction is 65%, normal myocardial perfusion study, low risk scan   PARTIAL HYSTERECTOMY     TRANSTHORACIC ECHOCARDIOGRAM  [id number]   Social History   Social History Narrative   Not on file   Immunization History  Administered Date(s) Administered   Influenza, High Dose Seasonal PF 06/25/2018   Pneumococcal Conjugate-13 01/26/2015      Objective: Vital Signs: BP (!) 130/58 (BP Location: Left Arm, Patient Position: Sitting, Cuff Size: Normal)    Pulse 100    Resp 18    Ht '5\' 2"'  (1.575 m)    Wt 159 lb 12.8 oz (72.5 kg)    BMI 29.23 kg/m    Physical Exam Vitals signs and nursing note reviewed.  Constitutional:      Appearance: She is well-developed.  HENT:     Head: Normocephalic and atraumatic.  Eyes:     Conjunctiva/sclera: Conjunctivae normal.  Neck:     Musculoskeletal: Normal range of motion.  Cardiovascular:     Rate and Rhythm: Normal rate and regular rhythm.     Heart sounds: Normal heart sounds.  Pulmonary:     Effort: Pulmonary effort is normal.     Breath sounds: Normal breath sounds.  Abdominal:     General: Bowel sounds are normal.     Palpations: Abdomen is soft.  Lymphadenopathy:     Cervical: No cervical adenopathy.  Skin:    General: Skin is warm and dry.     Capillary Refill: Capillary refill takes less than 2 seconds.  Neurological:     Mental Status: She is alert and oriented to person, place, and time.  Psychiatric:        Behavior: Behavior normal.      Musculoskeletal Exam: C-spine was in good range of motion.  Shoulder joints, elbow joints with good range of motion.  She has PIP and DIP thickening and tenderness over PIPs and MCPs but no synovitis was noted.  She has intermittent left index trigger finger.  Knee joints are in good range of motion.  CDAI Exam: CDAI Score: -- Patient Global: --; Provider Global: -- Swollen: --; Tender: -- Joint Exam   No joint exam has been documented for this visit   There is currently no information documented on the homunculus. Go to the Rheumatology activity and complete the homunculus joint exam.  Investigation: No additional findings.  Imaging: Xr Foot 2 Views Left  Result Date: 03/26/2019 First MTP, PIP and DIP narrowing was noted.  Generalized osteopenia was noted.  No erosive changes were noted.  Some cystic changes were noted in  the MTPs.  Intertarsal joint narrowing and dorsal spurring was noted.  Tibiotalar joint space narrowing was noted.  Posterior calcaneal spurs were noted.  Vascular calcification was noted. Impression: These findings were consistent with osteoarthritis and possible inflammatory arthritis.  Xr Foot 2 Views Right  Result Date: 03/26/2019 Severe first MTP, PIP and DIP narrowing was noted.  Metatarsal tarsal joint space narrowing was noted.  Possible cystic versus erosive changes were noted at the base of the metatarsals.  Metatarsal tarsal joint space narrowing with spurring was noted.  Narrowing of the tibiotalar space was noted.  Calcaneal spurs were noted. Impression: These findings are consistent with osteoarthritis and possible inflammatory arthritis.  Xr Hand 2 View Left  Result Date: 03/26/2019 Severe CMC narrowing was noted.  Severe PIP and DIP narrowing was noted.  Severe narrowing of second third fourth and fifth MCP joints was  noted.  Cystic changes were noted in the third MCP joint.  Intercarpal and radiocarpal joint space narrowing was noted. Impression: These findings are consistent with severe osteoarthritis and inflammatory arthritis.  Xr Hand 2 View Right  Result Date: 03/26/2019 Severe PIP and DIP joint space narrowing was noted.  Narrowing of almost all MCP joints was noted.  Possible cystic and erosive change were noted in the second MCP joint.  Erosive changes noted in the second DIP.  Severe CMC narrowing was noted.  Intercarpal and radiocarpal joint space narrowing was noted.  Generalized osteopenia was noted. Impression: These findings are consistent with osteoarthritis and inflammatory arthritis which could be due to gouty arthropathy or other inflammatory arthropathy.  Xr Knee 3 View Left  Result Date: 03/26/2019 Severe lateral compartment narrowing was noted.  Moderate to severe medial compartment narrowing was noted.  Moderate patellofemoral narrowing was noted.  No  chondrocalcinosis was noted.  Vascular calcification was noted. Impression: These findings are consistent with severe arthritis of the knee joint and moderate chondromalacia patella.  Xr Knee 3 View Right  Result Date: 03/26/2019 Severe medial compartment narrowing and lateral compartment narrowing was noted.  Lateral osteophytes were noted.  No chondrocalcinosis was noted.  Severe patellofemoral narrowing was noted.  Vascular calcification was noted. Impression: These findings are consistent with severe arthritis of the knee joint most likely inflammatory arthritis due to bilateral compartmental narrowing.  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   Recent Labs: Lab Results  Component Value Date   WBC 6.2 03/03/2019   HGB 8.8 (L) 03/03/2019   PLT 221 03/03/2019   NA 141 03/03/2019   K 4.5 03/03/2019   CL 100 03/03/2019   CO2 24 03/03/2019   GLUCOSE 174 (H) 03/03/2019   BUN 46 (H) 03/03/2019   CREATININE 1.64 (H) 03/03/2019   BILITOT 0.2 03/03/2019   ALKPHOS 67 03/03/2019   AST 22 03/03/2019   ALT 15 03/03/2019   PROT 5.9 (L) 03/03/2019   ALBUMIN 3.5 03/03/2019   CALCIUM 8.9 03/03/2019   GFRAA 32 (L) 03/03/2019  March 26, 2019 RF negative, anti-CCP negative, 14 11/10/2008-, ESR 28, G6PD normal, uric acid 10.2  Speciality Comments: No specialty comments available.  Procedures:  No procedures performed Allergies: Ace inhibitors and Valsartan   Assessment / Plan:     Visit Diagnoses: History of gout -patient has intermittent gout flares but she is appears to have intercritical gout with ongoing pain and swelling in her hands.  Detailed counseling guarding gout was provided.  Dietary management was also discussed.  She is currently on colchicine on a daily basis.  She would benefit of starting on allopurinol.  Her uric acid was 10.2.  Ideally her uric acid should be less than 6.  My plan is to start on allopurinol 100 mg p.o. daily along with colchicine for the next month.  I  will see her back in a month and will obtain labs.  If her labs are stable we will increase allopurinol to 200 mg p.o. daily.  Acute on chronic renal insufficiency -she has low GFR.  She is also on Lasix because of other medical conditions.  Left index trigger finger-patient has intermittent triggering that finger.  Have advised her to massage the MCP joint.  If she has persistent problem we may consider injecting with cortisone.  Other medical problems are listed as follows:  History of diverticulitis   Hypertensive heart disease with chronic diastolic congestive heart failure ([place])  History of TIAs  History of diabetes mellitus, type II   Obstructive sleep apnea on CPAP  History of hyperlipidemia   Orders: No orders of the defined types were placed in this encounter.  Meds ordered this encounter  Medications   allopurinol (ZYLOPRIM) 100 MG tablet    Sig: Take 1 tablet (100 mg total) by mouth daily.    Dispense:  30 tablet    Refill:  0    Face-to-face time spent with patient was 25 minutes. Greater than 50% of time was spent in counseling and coordination of care.  Follow-Up Instructions: Return in about 3 months (around 07/11/2019) for Gout.   Bo Merino, MD  Note - This record has been created using Editor, commissioning.  Chart creation errors have been sought, but may not always  have been located. Such creation errors do not reflect on  the standard of medical care.

## 2019-04-10 ENCOUNTER — Other Ambulatory Visit: Payer: Self-pay

## 2019-04-10 ENCOUNTER — Ambulatory Visit: Payer: Medicare Other | Admitting: Rheumatology

## 2019-04-10 ENCOUNTER — Other Ambulatory Visit: Payer: Self-pay | Admitting: Rheumatology

## 2019-04-10 ENCOUNTER — Encounter: Payer: Self-pay | Admitting: Rheumatology

## 2019-04-10 VITALS — BP 130/58 | HR 100 | Resp 18 | Ht 62.0 in | Wt 159.8 lb

## 2019-04-10 DIAGNOSIS — Z8739 Personal history of other diseases of the musculoskeletal system and connective tissue: Secondary | ICD-10-CM

## 2019-04-10 DIAGNOSIS — Z8673 Personal history of transient ischemic attack (TIA), and cerebral infarction without residual deficits: Secondary | ICD-10-CM | POA: Diagnosis not present

## 2019-04-10 DIAGNOSIS — Z8719 Personal history of other diseases of the digestive system: Secondary | ICD-10-CM

## 2019-04-10 DIAGNOSIS — I5032 Chronic diastolic (congestive) heart failure: Secondary | ICD-10-CM

## 2019-04-10 DIAGNOSIS — I110 Hypertensive heart disease with heart failure: Secondary | ICD-10-CM | POA: Diagnosis not present

## 2019-04-10 DIAGNOSIS — M65322 Trigger finger, left index finger: Secondary | ICD-10-CM

## 2019-04-10 DIAGNOSIS — Z9989 Dependence on other enabling machines and devices: Secondary | ICD-10-CM

## 2019-04-10 DIAGNOSIS — N289 Disorder of kidney and ureter, unspecified: Secondary | ICD-10-CM

## 2019-04-10 DIAGNOSIS — N189 Chronic kidney disease, unspecified: Secondary | ICD-10-CM

## 2019-04-10 DIAGNOSIS — Z8639 Personal history of other endocrine, nutritional and metabolic disease: Secondary | ICD-10-CM

## 2019-04-10 DIAGNOSIS — G4733 Obstructive sleep apnea (adult) (pediatric): Secondary | ICD-10-CM

## 2019-04-10 MED ORDER — ALLOPURINOL 100 MG PO TABS: 100.0000 mg | ORAL_TABLET | Freq: Every day | ORAL | 0 refills | Status: DC

## 2019-04-10 NOTE — Patient Instructions (Signed)
Low-Purine Eating Plan A low-purine eating plan involves making food choices to limit your intake of purine. Purine is a kind of uric acid. Too much uric acid in your blood can cause certain conditions, such as gout and kidney stones. Eating a low-purine diet can help control these conditions. What are tips for following this plan? Reading food labels   Avoid foods with saturated or Trans fat.  Check the ingredient list of grains-based foods, such as bread and cereal, to make sure that they contain whole grains.  Check the ingredient list of sauces or soups to make sure they do not contain meat or fish.  When choosing soft drinks, check the ingredient list to make sure they do not contain high-fructose corn syrup. Shopping  Buy plenty of fresh fruits and vegetables.  Avoid buying canned or fresh fish.  Buy dairy products labeled as low-fat or nonfat.  Avoid buying premade or processed foods. These foods are often high in fat, salt (sodium), and added sugar. Cooking  Use olive oil instead of butter when cooking. Oils like olive oil, canola oil, and sunflower oil contain healthy fats. Meal planning  Learn which foods do or do not affect you. If you find out that a food tends to cause your gout symptoms to flare up, avoid eating that food. You can enjoy foods that do not cause problems. If you have any questions about a food item, talk with your dietitian or health care provider.  Limit foods high in fat, especially saturated fat. Fat makes it harder for your body to get rid of uric acid.  Choose foods that are lower in fat and are lean sources of protein. General guidelines  Limit alcohol intake to no more than 1 drink a day for nonpregnant women and 2 drinks a day for men. One drink equals 12 oz of beer, 5 oz of wine, or 1 oz of hard liquor. Alcohol can affect the way your body gets rid of uric acid.  Drink plenty of water to keep your urine clear or pale yellow. Fluids can help  remove uric acid from your body.  If directed by your health care provider, take a vitamin C supplement.  Work with your health care provider and dietitian to develop a plan to achieve or maintain a healthy weight. Losing weight can help reduce uric acid in your blood. What foods are recommended? The items listed may not be a complete list. Talk with your dietitian about what dietary choices are best for you. Foods low in purines Foods low in purines do not need to be limited. These include:  All fruits.  All low-purine vegetables, pickles, and olives.  Breads, pasta, rice, cornbread, and popcorn. Cake and other baked goods.  All dairy foods.  Eggs, nuts, and nut butters.  Spices and condiments, such as salt, herbs, and vinegar.  Plant oils, butter, and margarine.  Water, sugar-free soft drinks, tea, coffee, and cocoa.  Vegetable-based soups, broths, sauces, and gravies. Foods moderate in purines Foods moderate in purines should be limited to the amounts listed.   cup of asparagus, cauliflower, spinach, mushrooms, or green peas, each day.  2/3 cup uncooked oatmeal, each day.   cup dry wheat bran or wheat germ, each day.  2-3 ounces of meat or poultry, each day.  4-6 ounces of shellfish, such as crab, lobster, oysters, or shrimp, each day.  1 cup cooked beans, peas, or lentils, each day.  Soup, broths, or bouillon made from meat or   fish. Limit these foods as much as possible. What foods are not recommended? The items listed may not be a complete list. Talk with your dietitian about what dietary choices are best for you. Limit your intake of foods high in purines, including:  Beer and other alcohol.  Meat-based gravy or sauce.  Canned or fresh fish, such as: ? Anchovies, sardines, herring, and tuna. ? Mussels and scallops. ? Codfish, trout, and haddock.  Berniece Salines.  Organ meats, such as: ? Liver or kidney. ? Tripe. ? Sweetbreads (thymus gland or pancreas).   [employer] biochemist.  Yeast or yeast extract supplements.  Drinks sweetened with high-fructose corn syrup. Summary  Eating a low-purine diet can help control conditions caused by too much uric acid in the body, such as gout or kidney stones.  Choose low-purine foods, limit alcohol, and limit foods high in fat.  You will learn over time which foods do or do not affect you. If you find out that a food tends to cause your gout symptoms to flare up, avoid eating that food. This information is not intended to replace advice given to you by your health care provider. Make sure you discuss any questions you have with your health care provider. Document Released: 12/22/2010 Document Revised: 08/09/2017 Document Reviewed: 10/10/2016 Elsevier Patient Education  2020 [employer]. Gout  Gout is painful swelling of your joints. Gout is a type of arthritis. It is caused by having too much uric acid in your body. Uric acid is a chemical that is made when your body breaks down substances called purines. If your body has too much uric acid, sharp crystals can form and build up in your joints. This causes pain and swelling. Gout attacks can happen quickly and be very painful (acute gout). Over time, the attacks can affect more joints and happen more often (chronic gout). What are the causes?  Too much uric acid in your blood. This can happen because: ? Your kidneys do not remove enough uric acid from your blood. ? Your body makes too much uric acid. ? You eat too many foods that are high in purines. These foods include organ meats, some seafood, and beer.  Trauma or stress. What increases the risk?  Having a family history of gout.  Being female and middle-aged.  Being female and having gone through menopause.  Being very overweight (obese).  Drinking alcohol, especially beer.  Not having enough water in the body (being dehydrated).  Losing weight too quickly.  Having an organ transplant.   Having lead poisoning.  Taking certain medicines.  Having kidney disease.  Having a skin condition called psoriasis. What are the signs or symptoms? An attack of acute gout usually happens in just one joint. The most common place is the big toe. Attacks often start at night. Other joints that may be affected include joints of the feet, ankle, knee, fingers, wrist, or elbow. Symptoms of an attack may include:  Very bad pain.  Warmth.  Swelling.  Stiffness.  Shiny, red, or purple skin.  Tenderness. The affected joint may be very painful to touch.  Chills and fever. Chronic gout may cause symptoms more often. More joints may be involved. You may also have white or yellow lumps (tophi) on your hands or feet or in other areas near your joints. How is this treated?  Treatment for this condition has two phases: treating an acute attack and preventing future attacks.  Acute gout treatment may include: ? NSAIDs. ?  Steroids. These are taken by mouth or injected into a joint. ? Colchicine. This medicine relieves pain and swelling. It can be given by mouth or through an IV tube.  Preventive treatment may include: ? Taking small doses of NSAIDs or colchicine daily. ? Using a medicine that reduces uric acid levels in your blood. ? Making changes to your diet. You may need to see a food expert (dietitian) about what to eat and drink to prevent gout. Follow these instructions at home: During a gout attack   If told, put ice on the painful area: ? Put ice in a plastic bag. ? Place a towel between your skin and the bag. ? Leave the ice on for 20 minutes, 2-3 times a day.  Raise (elevate) the painful joint above the level of your heart as often as you can.  Rest the joint as much as possible. If the joint is in your leg, you may be given crutches.  Follow instructions from your doctor about what you cannot eat or drink. Avoiding future gout attacks  Eat a low-purine diet. Avoid  foods and drinks such as: ? Liver. ? Kidney. ? Anchovies. ? Asparagus. ? Herring. ? Mushrooms. ? Mussels. ? Beer.  Stay at a healthy weight. If you want to lose weight, talk with your doctor. Do not lose weight too fast.  Start or continue an exercise plan as told by your doctor. Eating and drinking  Drink enough fluids to keep your pee (urine) pale yellow.  If you drink alcohol: ? Limit how much you use to:  0-1 drink a day for women.  0-2 drinks a day for men. ? Be aware of how much alcohol is in your drink. In the [place] one drink equals one 12 oz bottle of beer (355 mL), one 5 oz glass of wine (148 mL), or one 1 oz glass of hard liquor (44 mL). General instructions  Take over-the-counter and prescription medicines only as told by your doctor.  Do not drive or use heavy machinery while taking prescription pain medicine.  Return to your normal activities as told by your doctor. Ask your doctor what activities are safe for you.  Keep all follow-up visits as told by your doctor. This is important. Contact a doctor if:  You have another gout attack.  You still have symptoms of a gout attack after 10 days of treatment.  You have problems (side effects) because of your medicines.  You have chills or a fever.  You have burning pain when you pee (urinate).  You have pain in your lower back or belly. Get help right away if:  You have very bad pain.  Your pain cannot be controlled.  You cannot pee. Summary  Gout is painful swelling of the joints.  The most common site of pain is the big toe, but it can affect other joints.  Medicines and avoiding some foods can help to prevent and treat gout attacks. This information is not intended to replace advice given to you by your health care provider. Make sure you discuss any questions you have with your health care provider. Document Released: 06/05/2008 Document Revised: 03/19/2018 Document Reviewed: 03/19/2018  Elsevier Patient Education  2020 [employer].

## 2019-04-20 ENCOUNTER — Telehealth: Payer: Self-pay | Admitting: Cardiovascular Disease

## 2019-04-20 ENCOUNTER — Other Ambulatory Visit: Payer: Self-pay

## 2019-04-20 ENCOUNTER — Ambulatory Visit (HOSPITAL_COMMUNITY): Payer: Medicare Other | Attending: Cardiology

## 2019-04-20 DIAGNOSIS — R0600 Dyspnea, unspecified: Secondary | ICD-10-CM

## 2019-04-20 DIAGNOSIS — R0609 Other forms of dyspnea: Secondary | ICD-10-CM

## 2019-04-20 NOTE — Telephone Encounter (Signed)
Patient made aware of results and verbalized understanding.  Notes recorded by Lorretta Harp, MD on 04/20/2019 at 12:55 PM EDT  Essentially normal study. Repeat when clinically indicated

## 2019-04-20 NOTE — Telephone Encounter (Signed)
Patient calling for her Echo results.

## 2019-04-23 ENCOUNTER — Other Ambulatory Visit: Payer: Self-pay | Admitting: Cardiovascular Disease

## 2019-04-27 ENCOUNTER — Ambulatory Visit: Payer: Medicare Other | Admitting: Rheumatology

## 2019-04-30 NOTE — Progress Notes (Signed)
Office Visit Note  Patient: Maria [name]             Date of Birth: [birth year]           MRN: [id number]             PCP: Glendale Chard, MD Referring: Glendale Chard, MD Visit Date: 05/14/2019 Occupation: @GUAROCC @  Subjective:  Pain in both hands   History of Present Illness: Maria [name] is a 85 y.o. female with history of gout.  She has been taking allopurinol 100 mg 1 tablet by mouth daily for 1 month.  She is taking colchicine 0.6 mg 1 tablet daily.  She reports colchicine was very expensive last month.  She ran out of her prescription 4 days ago.   She is having pain in both hands.  She has pedal edema bilaterally. She is seeing her PCP this month. She continues to walk with a walker.    Activities of Daily Living:  Patient reports joint stiffness all day.  Patient Denies nocturnal pain.  Difficulty dressing/grooming: Reports Difficulty climbing stairs: Reports Difficulty getting out of chair: Denies Difficulty using hands for taps, buttons, cutlery, and/or writing: Reports  Review of Systems  Constitutional: Positive for fatigue.  HENT: Negative for mouth sores, mouth dryness and nose dryness.   Eyes: Negative for pain, visual disturbance and dryness.  Respiratory: Negative for cough, hemoptysis, shortness of breath and difficulty breathing.   Cardiovascular: Positive for swelling in legs/feet. Negative for chest pain, palpitations and hypertension.  Gastrointestinal: Negative for blood in stool, constipation and diarrhea.  Endocrine: Negative for increased urination.  Genitourinary: Negative for difficulty urinating and painful urination.  Musculoskeletal: Positive for arthralgias, joint pain, joint swelling, muscle weakness, morning stiffness and muscle tenderness. Negative for myalgias and myalgias.  Skin: Negative for color change, pallor, rash, hair loss, nodules/bumps, skin tightness, ulcers and sensitivity to sunlight.  Neurological: Positive for weakness.  Negative for dizziness and headaches.  Hematological: Negative for bruising/bleeding tendency and swollen glands.  Psychiatric/Behavioral: Negative for depressed mood and sleep disturbance. The patient is not nervous/anxious.     PMFS History:  Patient Active Problem List   Diagnosis Date Noted  . Acute on chronic diastolic CHF (congestive heart failure) ([place]) 03/26/2018  . Acute on chronic renal insufficiency 03/26/2018  . Dyspnea on exertion 02/14/2018  . Palpitations 02/13/2017  . Chest pain 03/25/2015  . Acute diverticulitis 03/04/2013  . Bilateral lower extremity edema 01/23/2013  . Hyperlipidemia 01/23/2013  . Diverticulitis 01/31/2012  . Diabetes mellitus ([place]) 01/31/2012  . Hypertensive cardiovascular disease 01/31/2012  . Obstructive sleep apnea on CPAP 01/31/2012  . History of TIAs 01/31/2012  . Obesity 01/31/2012  . H/O vertigo 01/31/2012    Past Medical History:  Diagnosis Date  . Anxiety   . Arthritis   . Blood transfusion   . Depression   . Diabetes mellitus   . Edema   . Heart murmur   . Hyperlipidemia   . Hypertension   . Sleep apnea    cpap  . Stroke ([place])    3 x tias  . TIA (transient ischemic attack)     Family History  Problem Relation Age of Onset  . Prostate cancer Other   . Healthy Mother   . Prostate cancer Father    Past Surgical History:  Procedure Laterality Date  . ABDOMINAL HYSTERECTOMY    . APPENDECTOMY    . NM MYOVIEW LTD  [id number]   post stress left ventricle is  normal in size, ejection fraction is 65%, normal myocardial perfusion study, low risk scan  . PARTIAL HYSTERECTOMY    . TRANSTHORACIC ECHOCARDIOGRAM  [id number]   Social History   Social History Narrative  . Not on file   Immunization History  Administered Date(s) Administered  . Influenza, High Dose Seasonal PF 06/25/2018  . Pneumococcal Conjugate-13 01/26/2015     Objective: Vital Signs: BP (!) 98/55 (BP Location: Left Arm, Patient Position: Sitting, Cuff Size:  Normal)   Pulse 95   Resp 20   Ht 5' 2.5" (1.588 m)   Wt 159 lb 9.6 oz (72.4 kg)   BMI 28.73 kg/m    Physical Exam Vitals signs and nursing note reviewed.  Constitutional:      Appearance: She is well-developed.  HENT:     Head: Normocephalic and atraumatic.  Eyes:     Conjunctiva/sclera: Conjunctivae normal.  Neck:     Musculoskeletal: Normal range of motion.  Cardiovascular:     Rate and Rhythm: Normal rate and regular rhythm.     Heart sounds: Normal heart sounds.  Pulmonary:     Effort: Pulmonary effort is normal.     Breath sounds: Normal breath sounds.  Abdominal:     General: Bowel sounds are normal.     Palpations: Abdomen is soft.  Lymphadenopathy:     Cervical: No cervical adenopathy.  Skin:    General: Skin is warm and dry.     Capillary Refill: Capillary refill takes less than 2 seconds.  Neurological:     Mental Status: She is alert and oriented to person, place, and time.  Psychiatric:        Behavior: Behavior normal.      Musculoskeletal Exam: C-spine good ROM.  Thoracic kyphosis noted.  Difficult to assess lumbar ROM while in seated position.  Shoulder joints and elbow joints good ROM with no discomfort.  Left 2nd MCP joint synovial thickening. She has incomplete fist formation bilaterally.  PIP and DIP synovial thickening consistent with osteoarthritis of both hands.  Knee joints good ROM with no discomfort.  Pedal edema bilaterally.   CDAI Exam: CDAI Score: - Patient Global: -; Provider Global: - Swollen: -; Tender: - Joint Exam   No joint exam has been documented for this visit   There is currently no information documented on the homunculus. Go to the Rheumatology activity and complete the homunculus joint exam.  Investigation: No additional findings.  Imaging: No results found.  Recent Labs: Lab Results  Component Value Date   WBC 6.2 03/03/2019   HGB 8.8 (L) 03/03/2019   PLT 221 03/03/2019   NA 141 03/03/2019   K 4.5 03/03/2019    CL 100 03/03/2019   CO2 24 03/03/2019   GLUCOSE 174 (H) 03/03/2019   BUN 46 (H) 03/03/2019   CREATININE 1.64 (H) 03/03/2019   BILITOT 0.2 03/03/2019   ALKPHOS 67 03/03/2019   AST 22 03/03/2019   ALT 15 03/03/2019   PROT 5.9 (L) 03/03/2019   ALBUMIN 3.5 03/03/2019   CALCIUM 8.9 03/03/2019   GFRAA 32 (L) 03/03/2019    Speciality Comments: No specialty comments available.  Procedures:  No procedures performed Allergies: Ace inhibitors and Valsartan   Assessment / Plan:     Visit Diagnoses: Idiopathic chronic gout of multiple sites without tophus -She continues to have intermittent gout flares.  She is having pain in both hands.  She has synovial thickening of the left 2nd MCP joint and all PIP and DIP joints.  No synovitis was noted.  She has been taking allopurinol 100 mg 1 tablet by mouth daily for 1 month.  She ran out of her prescription 4 days ago.  She continues to take colchicine 0.6 mg 1 tablet by mouth daily.  We will check uric acid level, CBC, and CMP today.  Ideally her uric acid level should be below 6.  She will increase the dose of allopurinol to 100 mg 2 tablets by mouth daily for 1 month.  We will recheck uric acid level in 1 month, and if labs are stable at that time she will increase her allopurinol 300 mg by mouth daily.  She will continue taking colchicine 0.6 mg 1 tablet by mouth daily.  She will follow-up in the office in 1 month.  - Plan: Uric acid, CBC with Differential/Platelet, COMPLETE METABOLIC PANEL WITH GFR, Uric acid  Medication monitoring encounter -CBC and CMP were drawn today.  Plan: COMPLETE METABOLIC PANEL WITH GFR, CBC with Differential/Platelet, CBC with Differential/Platelet, COMPLETE METABOLIC PANEL WITH GFR  Trigger finger, left index finger: She has intermittent triggering.   Other medical conditions are listed as follows:   Acute on chronic renal insufficiency   Hypertensive heart disease with chronic diastolic congestive heart failure (HCC)   History of diverticulitis  History of TIAs  History of diabetes mellitus, type II  Obstructive sleep apnea on CPAP  History of hyperlipidemia    Orders: Orders Placed This Encounter  Procedures  . Uric acid  . COMPLETE METABOLIC PANEL WITH GFR  . CBC with Differential/Platelet  . CBC with Differential/Platelet  . COMPLETE METABOLIC PANEL WITH GFR  . Uric acid   Meds ordered this encounter  Medications  . allopurinol (ZYLOPRIM) 100 MG tablet    Sig: Take 2 tablets (200 mg total) by mouth daily    Dispense:  60 tablet    Refill:  0     Follow-Up Instructions: Return in 4 weeks (on 06/11/2019) for Gout.   Ofilia Neas, PA-C   I examined and evaluated the patient with Hazel Sams PA.  Patient continues to have some flares of gout.  She had some tenderness on her hands.  We will increase the dose of allopurinol to 200 mg p.o. daily.  Her labs will be checked today.  She will follow-up again in a month and then dose can be adjusted accordingly.  The plan of care was discussed as noted above.  Bo Merino, MD  Note - This record has been created using Editor, commissioning.  Chart creation errors have been sought, but may not always  have been located. Such creation errors do not reflect on  the standard of medical care.

## 2019-05-05 ENCOUNTER — Other Ambulatory Visit: Payer: Self-pay

## 2019-05-05 ENCOUNTER — Telehealth: Payer: Self-pay

## 2019-05-05 MED ORDER — COLCHICINE 0.6 MG PO TABS: 0.6000 mg | ORAL_TABLET | Freq: Every day | ORAL | 0 refills | Status: DC

## 2019-05-05 NOTE — Telephone Encounter (Signed)
The pt wanted to know if she should continue with her Colcrys because she has been started on allopurinol by her rheumatologist.   The pt was told that Dr. Baird Cancer has to ready the note from the rheumatologist and that the pt will get a call back to let her know if she should continue with the medication.

## 2019-05-05 NOTE — Telephone Encounter (Signed)
The pt was notified that Dr. Baird Cancer said to stay on the colchicine for 30 more days.  The refill was faxed to the pt's pharmacy.

## 2019-05-14 ENCOUNTER — Ambulatory Visit (INDEPENDENT_AMBULATORY_CARE_PROVIDER_SITE_OTHER): Payer: Medicare Other | Admitting: Rheumatology

## 2019-05-14 ENCOUNTER — Telehealth: Payer: Self-pay | Admitting: Pharmacist

## 2019-05-14 ENCOUNTER — Other Ambulatory Visit: Payer: Self-pay | Admitting: Physician Assistant

## 2019-05-14 ENCOUNTER — Encounter: Payer: Self-pay | Admitting: Rheumatology

## 2019-05-14 ENCOUNTER — Other Ambulatory Visit: Payer: Self-pay

## 2019-05-14 VITALS — BP 98/55 | HR 95 | Resp 20 | Ht 62.5 in | Wt 159.6 lb

## 2019-05-14 DIAGNOSIS — I5032 Chronic diastolic (congestive) heart failure: Secondary | ICD-10-CM

## 2019-05-14 DIAGNOSIS — I110 Hypertensive heart disease with heart failure: Secondary | ICD-10-CM

## 2019-05-14 DIAGNOSIS — G4733 Obstructive sleep apnea (adult) (pediatric): Secondary | ICD-10-CM

## 2019-05-14 DIAGNOSIS — M1A09X0 Idiopathic chronic gout, multiple sites, without tophus (tophi): Secondary | ICD-10-CM | POA: Diagnosis not present

## 2019-05-14 DIAGNOSIS — Z8673 Personal history of transient ischemic attack (TIA), and cerebral infarction without residual deficits: Secondary | ICD-10-CM

## 2019-05-14 DIAGNOSIS — N289 Disorder of kidney and ureter, unspecified: Secondary | ICD-10-CM | POA: Diagnosis not present

## 2019-05-14 DIAGNOSIS — Z9989 Dependence on other enabling machines and devices: Secondary | ICD-10-CM

## 2019-05-14 DIAGNOSIS — Z5181 Encounter for therapeutic drug level monitoring: Secondary | ICD-10-CM

## 2019-05-14 DIAGNOSIS — Z8719 Personal history of other diseases of the digestive system: Secondary | ICD-10-CM

## 2019-05-14 DIAGNOSIS — N189 Chronic kidney disease, unspecified: Secondary | ICD-10-CM

## 2019-05-14 DIAGNOSIS — M65322 Trigger finger, left index finger: Secondary | ICD-10-CM | POA: Diagnosis not present

## 2019-05-14 DIAGNOSIS — Z8639 Personal history of other endocrine, nutritional and metabolic disease: Secondary | ICD-10-CM

## 2019-05-14 MED ORDER — ALLOPURINOL 100 MG PO TABS: ORAL_TABLET | ORAL | 0 refills | Status: DC

## 2019-05-14 NOTE — Telephone Encounter (Signed)
Patient is having difficulty paying for her gout medications.  She is on allopurinol and colchicine. Household size is 1 and monthly income is $1,700.  Please apply for [employer].   Mariella Saa, PharmD, [place], [place] Clinical Specialty Pharmacist [phone]  05/14/2019 10:23 AM

## 2019-05-14 NOTE — Telephone Encounter (Signed)
Thank you :)

## 2019-05-14 NOTE — Telephone Encounter (Signed)
Patient has been APPROVED for $12,000 [name] for copay assistance.  [employer] Identification Number: [id number] Fund: Gout - Medicare Access Enrollment Period: 04/14/2019 through 04/12/2020 Fatima Sanger Amount: $12,000.00  Pharmacy Card [id number] [id number] [id number] BIN- [id number] PCN- [id number]  Will call to advise patient and provide information to patient's pharmacy.  10:51 AM Beatriz Chancellor, CPhT

## 2019-05-14 NOTE — Patient Instructions (Addendum)
Take Allopurinol 2 tablets (200 mg total) by mouth daily for 1 month.  A refill of allopurinol was sent to the pharmacy today. Continue taking colchicine 0.6 mg 1 tablet by mouth daily.  Follow up in the office in 28 days and we will recheck lab work at that office visit   Standing Labs We placed an order today for your standing lab work.    Please come back and get your standing labs in 1 month  We have open lab daily Monday through Thursday from 8:30-12:30 PM and 1:30-4:30 PM and Friday from 8:30-12:30 PM and 1:30 -4:00 PM at the office of Dr. Bo Merino.   You may experience shorter wait times on Monday and Friday afternoons. The office is located at [address] [place], [place], [place] [address] No appointment is necessary.   Labs are drawn by [facility].  You may receive a bill from [facility] for your lab work.  If you wish to have your labs drawn at another location, please call the office 24 hours in advance to send orders.  If you have any questions regarding directions or hours of operation,  please call [phone].   Just as a reminder please drink plenty of water prior to coming for your lab work. Thanks!

## 2019-05-15 LAB — CBC WITH DIFFERENTIAL/PLATELET
Absolute Monocytes: 504 cells/uL (ref 200–950)
Basophils Absolute: 21 cells/uL (ref 0–200)
Basophils Relative: 0.3 %
Eosinophils Absolute: 41 cells/uL (ref 15–500)
Eosinophils Relative: 0.6 %
HCT: 31.1 % — ABNORMAL LOW (ref 35.0–45.0)
Hemoglobin: 9.7 g/dL — ABNORMAL LOW (ref 11.7–15.5)
Lymphs Abs: 1622 cells/uL (ref 850–3900)
MCH: 26.9 pg — ABNORMAL LOW (ref 27.0–33.0)
MCHC: 31.2 g/dL — ABNORMAL LOW (ref 32.0–36.0)
MCV: 86.4 fL (ref 80.0–100.0)
MPV: 11.4 fL (ref 7.5–12.5)
Monocytes Relative: 7.3 %
Neutro Abs: 4713 cells/uL (ref 1500–7800)
Neutrophils Relative %: 68.3 %
Platelets: 234 10*3/uL (ref 140–400)
RBC: 3.6 10*6/uL — ABNORMAL LOW (ref 3.80–5.10)
RDW: 16 % — ABNORMAL HIGH (ref 11.0–15.0)
Total Lymphocyte: 23.5 %
WBC: 6.9 10*3/uL (ref 3.8–10.8)

## 2019-05-15 LAB — COMPLETE METABOLIC PANEL WITH GFR
AG Ratio: 1.4 (calc) (ref 1.0–2.5)
ALT: 23 U/L (ref 6–29)
AST: 26 U/L (ref 10–35)
Albumin: 3.7 g/dL (ref 3.6–5.1)
Alkaline phosphatase (APISO): 80 U/L (ref 37–153)
BUN/Creatinine Ratio: 23 (calc) — ABNORMAL HIGH (ref 6–22)
BUN: 34 mg/dL — ABNORMAL HIGH (ref 7–25)
CO2: 29 mmol/L (ref 20–32)
Calcium: 9.1 mg/dL (ref 8.6–10.4)
Chloride: 101 mmol/L (ref 98–110)
Creat: 1.5 mg/dL — ABNORMAL HIGH (ref 0.60–0.88)
GFR, Est African American: 35 mL/min/{1.73_m2} — ABNORMAL LOW (ref >=60)
GFR, Est Non African American: 30 mL/min/{1.73_m2} — ABNORMAL LOW (ref >=60)
Globulin: 2.6 g/dL (calc) (ref 1.9–3.7)
Glucose, Bld: 149 mg/dL — ABNORMAL HIGH (ref 65–99)
Potassium: 3.6 mmol/L (ref 3.5–5.3)
Sodium: 141 mmol/L (ref 135–146)
Total Bilirubin: 0.5 mg/dL (ref 0.2–1.2)
Total Protein: 6.3 g/dL (ref 6.1–8.1)

## 2019-05-15 LAB — URIC ACID: Uric Acid, Serum: 7.7 mg/dL — ABNORMAL HIGH (ref 2.5–7.0)

## 2019-05-15 NOTE — Progress Notes (Signed)
Uric acid is elevated-7.7 but is trending down.  She will be increasing allopurinol to 200 mg po daily for 1 month.   Creatinine is elevated but trending down.  GFR is 35.  Glucose is elevated-149.  RBC count, hgb and hct are low but trending up.

## 2019-05-20 ENCOUNTER — Telehealth: Payer: Self-pay | Admitting: Pharmacist

## 2019-05-21 ENCOUNTER — Other Ambulatory Visit: Payer: Self-pay | Admitting: Cardiovascular Disease

## 2019-05-21 ENCOUNTER — Other Ambulatory Visit: Payer: Self-pay

## 2019-05-21 MED ORDER — FUROSEMIDE 40 MG PO TABS: ORAL_TABLET | ORAL | 2 refills | Status: DC

## 2019-05-22 ENCOUNTER — Other Ambulatory Visit: Payer: Self-pay | Admitting: Internal Medicine

## 2019-05-22 DIAGNOSIS — Z794 Long term (current) use of insulin: Secondary | ICD-10-CM

## 2019-05-22 DIAGNOSIS — E1122 Type 2 diabetes mellitus with diabetic chronic kidney disease: Secondary | ICD-10-CM

## 2019-05-22 DIAGNOSIS — I129 Hypertensive chronic kidney disease with stage 1 through stage 4 chronic kidney disease, or unspecified chronic kidney disease: Secondary | ICD-10-CM

## 2019-05-22 DIAGNOSIS — M1A3420 Chronic gout due to renal impairment, left hand, without tophus (tophi): Secondary | ICD-10-CM

## 2019-05-25 ENCOUNTER — Telehealth: Payer: Self-pay | Admitting: Cardiovascular Disease

## 2019-05-25 ENCOUNTER — Other Ambulatory Visit: Payer: Self-pay

## 2019-05-25 DIAGNOSIS — I491 Atrial premature depolarization: Secondary | ICD-10-CM | POA: Diagnosis not present

## 2019-05-25 DIAGNOSIS — R55 Syncope and collapse: Secondary | ICD-10-CM | POA: Diagnosis not present

## 2019-05-25 DIAGNOSIS — I4510 Unspecified right bundle-branch block: Secondary | ICD-10-CM | POA: Diagnosis not present

## 2019-05-25 DIAGNOSIS — I10 Essential (primary) hypertension: Secondary | ICD-10-CM | POA: Diagnosis not present

## 2019-05-25 DIAGNOSIS — R42 Dizziness and giddiness: Secondary | ICD-10-CM | POA: Diagnosis not present

## 2019-05-25 DIAGNOSIS — E1122 Type 2 diabetes mellitus with diabetic chronic kidney disease: Secondary | ICD-10-CM

## 2019-05-25 NOTE — Telephone Encounter (Signed)
New message   Per EMS states that the patient does not want to go to ER. The EMS is calling to see if they can find out the patient's medical history. Sent the call to R[facility].

## 2019-05-25 NOTE — Telephone Encounter (Signed)
Spoke with [facility] she just wanted to go over pt's EKG from prev and she is asking if pt had H/O PVC's and RBBB. Reviewed previous EKG with Whitney which notated RBBB. Looked at [facility] note which states pt does have previous PVC's and palpitations. [facility] states that pt was found by her neighbor in the bathroom and was awake but not responding to her calling her until she went into the bathroom to arouse her and called EMS. Pt states that she was "out of it" for a few seconds-minutes but did not loose consciousness and is refusing EMS transport to hospital. Loree Fee will try to change her mind.  Called pt and scheduled f/u appt 06-04-2019 @11 :15am to discuss this EMS visit

## 2019-05-28 NOTE — Progress Notes (Signed)
Office Visit Note  Patient: Maria [name]             Date of Birth: March 27, 1926           MRN: [id number]             PCP: Glendale Chard, MD Referring: Glendale Chard, MD Visit Date: 06/11/2019 Occupation: @GUAROCC @  Subjective:  Pain in both hands    History of Present Illness: Maria [name] is a 85 y.o. female with history of gout.  She has been taking allopurinol 200 mg by mouth daily for the past 1 month.  She continues to take colchicine 0.6 mg 1 tablet by mouth daily.  She denies any recent gout flares but continues to have chronic pain in both hands.  She has discomfort in the left knee joint but denies any joint swelling at this time.  She does have pedal edema bilaterally.  She continues to walk with a walker to assist with ambulation.  She states that she has been taking magnesium malate 250 mg by mouth at bedtime which has been helping with her muscle cramps in her lower extremities.  She reports that she experiences weakness in her lower extremities and was evaluated by Dr. Baird Cancer.  Baird Cancer has organized home therapy to work with the patient on lower extremity strengthening.      Activities of Daily Living:  Patient reports morning stiffness for several hours.   Patient Denies nocturnal pain.  Difficulty dressing/grooming: Reports Difficulty climbing stairs: Reports Difficulty getting out of chair: Denies Difficulty using hands for taps, buttons, cutlery, and/or writing: Reports  Review of Systems  Constitutional: Positive for fatigue.  HENT: Negative for mouth sores, mouth dryness and nose dryness.   Eyes: Negative for itching and dryness.  Respiratory: Negative for shortness of breath, wheezing and difficulty breathing.   Cardiovascular: Positive for palpitations and swelling in legs/feet. Negative for chest pain.  Gastrointestinal: Negative for blood in stool, constipation and diarrhea.  Endocrine: Negative for increased urination.  Genitourinary: Negative  for difficulty urinating and painful urination.  Musculoskeletal: Positive for arthralgias, joint pain, joint swelling and morning stiffness.  Skin: Negative for rash.  Allergic/Immunologic: Negative for susceptible to infections.  Neurological: Positive for numbness and weakness. Negative for dizziness and headaches.  Hematological: Negative for swollen glands.  Psychiatric/Behavioral: Negative for sleep disturbance. The patient is not nervous/anxious.     PMFS History:  Patient Active Problem List   Diagnosis Date Noted  . Acute on chronic diastolic CHF (congestive heart failure) ([place]) 03/26/2018  . Acute on chronic renal insufficiency 03/26/2018  . Dyspnea on exertion 02/14/2018  . Palpitations 02/13/2017  . Chest pain 03/25/2015  . Acute diverticulitis 03/04/2013  . Bilateral lower extremity edema 01/23/2013  . Hyperlipidemia 01/23/2013  . Diverticulitis 01/31/2012  . Diabetes mellitus ([place]) 01/31/2012  . Hypertensive cardiovascular disease 01/31/2012  . Obstructive sleep apnea on CPAP 01/31/2012  . History of TIAs 01/31/2012  . Obesity 01/31/2012  . H/O vertigo 01/31/2012    Past Medical History:  Diagnosis Date  . Anxiety   . Arthritis   . Blood transfusion   . Depression   . Diabetes mellitus   . Edema   . Heart murmur   . Hyperlipidemia   . Hypertension   . Sleep apnea    cpap  . Stroke ([place])    3 x tias  . TIA (transient ischemic attack)     Family History  Problem Relation Age of Onset  .  Prostate cancer Other   . Healthy Mother   . Prostate cancer Father    Past Surgical History:  Procedure Laterality Date  . ABDOMINAL HYSTERECTOMY    . APPENDECTOMY    . NM MYOVIEW LTD  [id number]   post stress left ventricle is normal in size, ejection fraction is 65%, normal myocardial perfusion study, low risk scan  . PARTIAL HYSTERECTOMY    . TRANSTHORACIC ECHOCARDIOGRAM  [id number]   Social History   Social History Narrative  . Not on file   Immunization  History  Administered Date(s) Administered  . Influenza, High Dose Seasonal PF 06/25/2018, 06/03/2019  . Pneumococcal Conjugate-13 01/26/2015     Objective: Vital Signs: BP (!) 99/54 (BP Location: Right Arm, Patient Position: Sitting, Cuff Size: Normal)   Pulse 100   Resp 12   Ht 4' 11.4" (1.509 m)   Wt 162 lb (73.5 kg)   BMI 32.28 kg/m    Physical Exam Vitals signs and nursing note reviewed.  Constitutional:      Appearance: She is well-developed.  HENT:     Head: Normocephalic and atraumatic.  Eyes:     Conjunctiva/sclera: Conjunctivae normal.  Neck:     Musculoskeletal: Normal range of motion.  Cardiovascular:     Rate and Rhythm: Normal rate and regular rhythm.     Heart sounds: Normal heart sounds.  Pulmonary:     Effort: Pulmonary effort is normal.     Breath sounds: Normal breath sounds.  Abdominal:     General: Bowel sounds are normal.     Palpations: Abdomen is soft.  Lymphadenopathy:     Cervical: No cervical adenopathy.  Skin:    General: Skin is warm and dry.     Capillary Refill: Capillary refill takes less than 2 seconds.  Neurological:     Mental Status: She is alert and oriented to person, place, and time.  Psychiatric:        Behavior: Behavior normal.      Musculoskeletal Exam: C-spine good ROM.  Thoracic kyphosis.  Difficult to assess lumbar ROM while in seated position.  Shoulder joints limited ROM with discomfort bilaterally. Right elbow joint contracture.  Incomplete fist formation bilaterally.  She has PIP and DIP synovial thickening consistent with osteoarthritis of both hands.  Hip joints difficult to assess in seated position.  Knee joints have good range of motion with no warmth or effusion.  She has bilateral knee crepitus.  No tenderness of ankle joints.  She has pedal edema bilaterally.  CDAI Exam: CDAI Score: - Patient Global: -; Provider Global: - Swollen: -; Tender: - Joint Exam   No joint exam has been documented for this visit    There is currently no information documented on the homunculus. Go to the Rheumatology activity and complete the homunculus joint exam.  Investigation: No additional findings.  Imaging: No results found.  Recent Labs: Lab Results  Component Value Date   WBC 6.9 05/14/2019   HGB 9.7 (L) 05/14/2019   PLT 234 05/14/2019   NA 141 05/14/2019   K 3.6 05/14/2019   CL 101 05/14/2019   CO2 29 05/14/2019   GLUCOSE 149 (H) 05/14/2019   BUN 34 (H) 05/14/2019   CREATININE 1.50 (H) 05/14/2019   BILITOT 0.5 05/14/2019   ALKPHOS 67 03/03/2019   AST 26 05/14/2019   ALT 23 05/14/2019   PROT 6.3 05/14/2019   ALBUMIN 3.5 03/03/2019   CALCIUM 9.1 05/14/2019   GFRAA 35 (L) 05/14/2019  Speciality Comments: No specialty comments available.  Procedures:  No procedures performed Allergies: Ace inhibitors and Valsartan   Assessment / Plan:     Visit Diagnoses: Idiopathic chronic gout of multiple sites without tophus -She has not had any recent gout flares, but she continues to have chronic pain in both hands.  No synovitis noted.  She continues to titrate the dose of allopurinol, and she has been tolerating it without any side effects.  She has been taking allopurinol 200 mg po daily and colchicine 0.6 mg 1 tablet by mouth daily for 1 month.  Uric acid level on 05/14/2019 was 7.7.  We will repeat uric acid level today.  She was advised to increase allopurinol to 300 mg by mouth daily.  A new prescription for allopurinol was sent to the pharmacy.  She will continue taking colchicine 0.6 mg 1 tablet daily.  She will follow-up in the office in 1 month and we will repeat lab work at that time.  She was advised to notify us if she develop signs or symptoms of a flare.  Plan: allopurinol (ZYLOPRIM) 300 MG tablet, CBC with Differential/Platelet, COMPLETE METABOLIC PANEL WITH GFR, Uric acid  Medication monitoring encounter -CBC and CMP will be drawn today to monitor for drug toxicity.  Plan: CBC with  Differential/Platelet, COMPLETE METABOLIC PANEL WITH GFR  She is on vitamin D 2000 units daily. She is naive to treatment. Last DEXA on 02/01/2017 ordered by Dr. Baird Cancer showed T score of -2.6 at right femur neck.  She is due for repeat DEXA.  Trigger finger, left index finger: She has intermittent discomfort and locking.   Other medical conditions are listed as follows:   Hypertensive heart disease with chronic diastolic congestive heart failure ([place])  Acute on chronic renal insufficiency  History of hyperlipidemia  History of diabetes mellitus, type II  History of diverticulitis  History of TIAs  Obstructive sleep apnea on CPAP  Orders: Orders Placed This Encounter  Procedures  . CBC with Differential/Platelet  . COMPLETE METABOLIC PANEL WITH GFR  . Uric acid   Meds ordered this encounter  Medications  . allopurinol (ZYLOPRIM) 300 MG tablet    Sig: Take 1 tablet (300 mg total) by mouth daily.    Dispense:  30 tablet    Refill:  0   Follow-Up Instructions: Return in about 4 weeks (around 07/09/2019) for Gout.   Ofilia Neas, PA-C   I examined and evaluated the patient with Hazel Sams PA.  Patient has been doing better without any recent flares.  We will increase allopurinol to 300 mg p.o. daily.  The plan of care was discussed as noted above.  Bo Merino, MD  Note - This record has been created using Editor, commissioning.  Chart creation errors have been sought, but may not always  have been located. Such creation errors do not reflect on  the standard of medical care.

## 2019-06-01 ENCOUNTER — Ambulatory Visit: Payer: Self-pay

## 2019-06-01 DIAGNOSIS — E1122 Type 2 diabetes mellitus with diabetic chronic kidney disease: Secondary | ICD-10-CM

## 2019-06-01 DIAGNOSIS — I129 Hypertensive chronic kidney disease with stage 1 through stage 4 chronic kidney disease, or unspecified chronic kidney disease: Secondary | ICD-10-CM

## 2019-06-01 DIAGNOSIS — M1A3420 Chronic gout due to renal impairment, left hand, without tophus (tophi): Secondary | ICD-10-CM

## 2019-06-01 DIAGNOSIS — N183 Chronic kidney disease, stage 3 unspecified: Secondary | ICD-10-CM

## 2019-06-01 NOTE — Chronic Care Management (AMB) (Signed)
  Chronic Care Management   Outreach Note  06/01/2019 Name: Maria [name] MRN: [id number] DOB: [birth year]  Referred by: Glendale Chard, MD Reason for referral : Care Coordination   An unsuccessful telephone outreach was attempted today. The patient was referred to the case management team by for assistance with chronic care management and care coordination.   Follow Up Plan: A HIPPA compliant phone message was left for the patient providing contact information and requesting a return call.  The care management team will reach out to the patient again over the next 10 days.   Daneen Schick, BSW, CDP Social Worker, Certified Dementia Practitioner [facility] [phone]

## 2019-06-03 ENCOUNTER — Encounter: Payer: Self-pay | Admitting: Internal Medicine

## 2019-06-03 ENCOUNTER — Ambulatory Visit (INDEPENDENT_AMBULATORY_CARE_PROVIDER_SITE_OTHER): Payer: Medicare Other | Admitting: Internal Medicine

## 2019-06-03 ENCOUNTER — Other Ambulatory Visit: Payer: Self-pay

## 2019-06-03 VITALS — BP 120/64 | HR 94 | Temp 98.9°F | Ht 59.4 in | Wt 159.6 lb

## 2019-06-03 DIAGNOSIS — E1122 Type 2 diabetes mellitus with diabetic chronic kidney disease: Secondary | ICD-10-CM

## 2019-06-03 DIAGNOSIS — R269 Unspecified abnormalities of gait and mobility: Secondary | ICD-10-CM

## 2019-06-03 DIAGNOSIS — R29898 Other symptoms and signs involving the musculoskeletal system: Secondary | ICD-10-CM

## 2019-06-03 DIAGNOSIS — N183 Chronic kidney disease, stage 3 (moderate): Secondary | ICD-10-CM

## 2019-06-03 DIAGNOSIS — I129 Hypertensive chronic kidney disease with stage 1 through stage 4 chronic kidney disease, or unspecified chronic kidney disease: Secondary | ICD-10-CM | POA: Diagnosis not present

## 2019-06-03 DIAGNOSIS — Z794 Long term (current) use of insulin: Secondary | ICD-10-CM

## 2019-06-03 DIAGNOSIS — Z79899 Other long term (current) drug therapy: Secondary | ICD-10-CM

## 2019-06-03 DIAGNOSIS — Z23 Encounter for immunization: Secondary | ICD-10-CM | POA: Diagnosis not present

## 2019-06-03 DIAGNOSIS — R002 Palpitations: Secondary | ICD-10-CM | POA: Diagnosis not present

## 2019-06-03 MED ORDER — COLCHICINE 0.6 MG PO TABS: 0.6000 mg | ORAL_TABLET | Freq: Every day | ORAL | 0 refills | Status: DC

## 2019-06-03 NOTE — Patient Instructions (Signed)
Weakness °Weakness is a lack of strength. You may feel weak all over your body (generalized), or you may feel weak in one part of your body (focal). There are many potential causes of weakness. Sometimes, the cause of your weakness may not be known. Some causes of weakness can be serious, so it is important to see your doctor. °Follow these instructions at home: °Activity °· Rest as needed. °· Try to get enough sleep. Most adults need 7-8 hours of sleep each night. Talk to your doctor about how much sleep you need each night. °· Do exercises, such as arm curls and leg raises, for 30 minutes at least 2 days a week or as told by your doctor. °· Think about working with a physical therapist or trainer to help you get stronger. °General instructions ° °· Take over-the-counter and prescription medicines only as told by your doctor. °· Eat a healthy, well-balanced diet. This includes: °? Proteins to build muscles, such as lean meats and fish. °? Fresh fruits and vegetables. °? Carbohydrates to boost energy, such as whole grains. °· Drink enough fluid to keep your pee (urine) pale yellow. °· Keep all follow-up visits as told by your doctor. This is important. °Contact a doctor if: °· Your weakness does not get better or it gets worse. °· Your weakness affects your ability to: °? Think clearly. °? Do your normal daily activities. °Get help right away if you: °· Have sudden weakness on one side of your face or body. °· Have chest pain. °· Have trouble breathing or shortness of breath. °· Have problems with your vision. °· Have trouble talking or swallowing. °· Have trouble standing or walking. °· Are light-headed. °· Pass out (lose consciousness). °Summary °· Weakness is a lack of strength. You may feel weak all over your body or just in one part of your body. °· There are many potential causes of weakness. Sometimes, the cause of your weakness may not be known. °· Rest as needed, and try to get enough sleep. Most adults  need 7-8 hours of sleep each night. °· Eat a healthy, well-balanced diet. °This information is not intended to replace advice given to you by your health care provider. Make sure you discuss any questions you have with your health care provider. °Document Released: 08/09/2008 Document Revised: 04/02/2018 Document Reviewed: 04/02/2018 °Elsevier Patient Education © 2020 [employer] Inc[employer] ° °

## 2019-06-04 ENCOUNTER — Encounter: Payer: Self-pay | Admitting: Cardiovascular Disease

## 2019-06-04 ENCOUNTER — Ambulatory Visit (INDEPENDENT_AMBULATORY_CARE_PROVIDER_SITE_OTHER): Payer: Medicare Other | Admitting: Cardiovascular Disease

## 2019-06-04 VITALS — BP 148/70 | HR 112 | Ht 59.0 in | Wt 160.0 lb

## 2019-06-04 DIAGNOSIS — E782 Mixed hyperlipidemia: Secondary | ICD-10-CM

## 2019-06-04 DIAGNOSIS — I5033 Acute on chronic diastolic (congestive) heart failure: Secondary | ICD-10-CM

## 2019-06-04 DIAGNOSIS — Z9989 Dependence on other enabling machines and devices: Secondary | ICD-10-CM

## 2019-06-04 DIAGNOSIS — R002 Palpitations: Secondary | ICD-10-CM | POA: Diagnosis not present

## 2019-06-04 DIAGNOSIS — Z008 Encounter for other general examination: Secondary | ICD-10-CM

## 2019-06-04 DIAGNOSIS — G4733 Obstructive sleep apnea (adult) (pediatric): Secondary | ICD-10-CM | POA: Diagnosis not present

## 2019-06-04 DIAGNOSIS — I5032 Chronic diastolic (congestive) heart failure: Secondary | ICD-10-CM

## 2019-06-04 DIAGNOSIS — I110 Hypertensive heart disease with heart failure: Secondary | ICD-10-CM | POA: Diagnosis not present

## 2019-06-04 LAB — VITAMIN B12: Vitamin B-12: >2000 pg/mL — ABNORMAL HIGH (ref 232–1245)

## 2019-06-04 LAB — MAGNESIUM: Magnesium: 1.5 mg/dL — ABNORMAL LOW (ref 1.6–2.3)

## 2019-06-04 LAB — HEMOGLOBIN A1C
Est. average glucose Bld gHb Est-mCnc: 151 mg/dL
Hgb A1c MFr Bld: 6.9 % — ABNORMAL HIGH (ref 4.8–5.6)

## 2019-06-04 LAB — TSH: TSH: 2.74 u[IU]/mL (ref 0.450–4.500)

## 2019-06-04 NOTE — Assessment & Plan Note (Signed)
History of essential hypertension with blood pressure measured today 148/70.  She is on amlodipine, hydralazine and Micardis.

## 2019-06-04 NOTE — Assessment & Plan Note (Signed)
History of diastolic heart failure with recent echo performed 04/20/2019 revealing normal LV systolic function with diastolic dysfunction.  She is on oral diuretics.

## 2019-06-04 NOTE — Assessment & Plan Note (Signed)
Maria [name] has complained of several weeks of palpitations that occur on a daily basis.  It is unclear the etiology.  She recently came down with gout was placed in allopurinol and colchicine.  She is not on steroids.  Medicare 2-week ZIO patch to further evaluate

## 2019-06-04 NOTE — Progress Notes (Signed)
06/04/2019 Maria [name]   [birth year]  [id number]  Primary Physician Glendale Chard, MD Primary Cardiologist: Lorretta Harp MD FACP, [place], [place], [place]  HPI:  Maria [name] is a 85 y.o.  moderately overweight, widowed Caucasian female, mother of 2, grandmother to 2 grandchildren who I last saw virtually 01/30/2019... Her problems include hypertension, hyperlipidemia, diabetes and obstructive sleep apnea on CPAP. She has normal coronary arteries and normal LV function by cath performed/29/12. She does have diastolic dysfunction. When I saw her back in February she was complaining of dyspnea and I did begin her on Zaroxolyn twice a week which resulted in some improvement in her symptoms as well as in her lower extremity edema, though, today she clearly admits to dietary indiscretion with regards to salt. She was admitted to [facility] for 4 days last month because of diverticulitis. Since her last office visit she continues to have dietary indiscretion related to salt with significant lower extremity edema and shortness of breath as well as palpitations. She does complain of occasional episodes of substernal chest pain but she has normal coronary arteries by cath which I performed 11/08/2010. Her edema is improved today. She denies chest pain but continues to get some dyspnea on exertion. She walks with a walker.  She has chronic palpitations and had an event monitor performed a year ago that showed sinus rhythm with PVCs. Her last 2D echo [id number] normal LV systolic function, moderate concentric LVH with grade 1 diastolic dysfunction. She does complain of chronic dyspnea which I suspect is related to chronic diastolic heart failure. She also has chronic bilateral lower extremity edema and has admitted to dietary indiscretion with regards to salt in the past.  She is done fairly well except for newly diagnosed gout in both hands which is her major complaint.  She is on  prednisone and colchicine, as well as allopurinol.  She is also complained of increasing dyspnea on exertion for the last several months but denies chest pain.  Her last 2D echo performed 02/25/2018 was essentially normal except for diastolic dysfunction.  She is on Lasix and Zaroxolyn.  She denies lower extremity edema in fact, since not going to restaurants, her salt intake is markedly been reduced as has her .  Her major complaint since I saw her last virtually is the relatively new onset palpitations.  She says these occur daily and other than feeling her heart has extra beats she really has no other symptoms.  I initially thought this is related related to prednisone although she apparently is no longer on that.  A recent 2D echo performed 04/20/2019 revealed normal LV systolic function with diastolic dysfunction.   No outpatient medications have been marked as taking for the 06/04/19 encounter (Office Visit) with Lorretta Harp, MD.     Allergies  Allergen Reactions  . Ace Inhibitors Other (See Comments)    Angioedema   . Valsartan Other (See Comments)    Social History   Socioeconomic History  . Marital status: Widowed    Spouse name: Not on file  . Number of children: Not on file  . Years of education: Not on file  . Highest education level: Not on file  Occupational History  . Occupation: retired  Scientific laboratory technician  . Financial resource strain: Not hard at all  . Food insecurity    Worry: Never true    Inability: Never true  . Transportation needs    Medical: No  Non-medical: No  Tobacco Use  . Smoking status: Never Smoker  . Smokeless tobacco: Never Used  Substance and Sexual Activity  . Alcohol use: No  . Drug use: No  . Sexual activity: Not Currently  Lifestyle  . Physical activity    Days per week: 0 days    Minutes per session: 0 min  . Stress: Not at all  Relationships  . Social Herbalist on phone: Not on file    Gets together: Not on file     Attends religious service: Not on file    Active member of club or organization: Not on file    Attends meetings of clubs or organizations: Not on file    Relationship status: Not on file  . Intimate partner violence    Fear of current or ex partner: No    Emotionally abused: No    Physically abused: No    Forced sexual activity: No  Other Topics Concern  . Not on file  Social History Narrative  . Not on file     Review of Systems: General: negative for chills, fever, night sweats or weight changes.  Cardiovascular: negative for chest pain, dyspnea on exertion, edema, orthopnea, palpitations, paroxysmal nocturnal dyspnea or shortness of breath Dermatological: negative for rash Respiratory: negative for cough or wheezing Urologic: negative for hematuria Abdominal: negative for nausea, vomiting, diarrhea, bright red blood per rectum, melena, or hematemesis Neurologic: negative for visual changes, syncope, or dizziness All other systems reviewed and are otherwise negative except as noted above.    Blood pressure (!) 148/70, pulse (!) 112, height 4\' 11"  (1.499 m), weight 160 lb (72.6 kg).  General appearance: alert and no distress Neck: no adenopathy, no carotid bruit, no JVD, supple, symmetrical, trachea midline and thyroid not enlarged, symmetric, no tenderness/mass/nodules Lungs: clear to auscultation bilaterally Heart: Soft outflow tract murmur Extremities: extremities normal, atraumatic, no cyanosis or edema Pulses: 2+ and symmetric Skin: Skin color, texture, turgor normal. No rashes or lesions Neurologic: Alert and oriented X 3, normal strength and tone. Normal symmetric reflexes. Normal coordination and gait  EKG sinus tachycardia at 101 with right bundle branch block.  I personally reviewed this EKG.  ASSESSMENT AND PLAN:   Palpitations Ms. Mallick has complained of several weeks of palpitations that occur on a daily basis.  It is unclear the etiology.  She recently came  down with gout was placed in allopurinol and colchicine.  She is not on steroids.  Medicare 2-week ZIO patch to further evaluate  Hypertensive cardiovascular disease History of essential hypertension with blood pressure measured today 148/70.  She is on amlodipine, hydralazine and Micardis.  Obstructive sleep apnea on CPAP History of obstructive sleep apnea not on CPAP  Hyperlipidemia History of hyperlipidemia on atorvastatin with lipid profile performed 03/03/2019 revealing total cholesterol 137, LDL 60 and HDL of 63.  Acute on chronic diastolic CHF (congestive heart failure) (HCC) History of diastolic heart failure with recent echo performed 04/20/2019 revealing normal LV systolic function with diastolic dysfunction.  She is on oral diuretics.      Lorretta Harp MD FACP,FACC,FAHA, [facility] 06/04/2019 11:45 AM

## 2019-06-04 NOTE — Patient Instructions (Signed)
Medication Instructions:  Your physician recommends that you continue on your current medications as directed. Please refer to the Current Medication list given to you today.  If you need a refill on your cardiac medications before your next appointment, please call your pharmacy.   Lab work: none If you have labs (blood work) drawn today and your tests are completely normal, you will receive your results only by: Marland Kitchen MyChart Message (if you have MyChart) OR . A paper copy in the mail If you have any lab test that is abnormal or we need to change your treatment, we will call you to review the results.  Testing/Procedures: Your physician has recommended that you wear a 14 DAY ZIO-PATCH monitor. The Zio patch cardiac monitor continuously records heart rhythm data for up to 14 days, this is for patients being evaluated for multiple types heart rhythms. For the first 24 hours post application, please avoid getting the Zio monitor wet in the shower or by excessive sweating during exercise. After that, feel free to carry on with regular activities. Keep soaps and lotions away from the ZIO XT Patch.  This will be placed at our [facility] location - [address], [place].         Follow-Up: At [facility], you and your health needs are our priority.  As part of our continuing mission to provide you with exceptional heart care, we have created designated Provider Care Teams.  These Care Teams include your primary Cardiologist (physician) and Advanced Practice Providers (APPs -  Physician Assistants and Nurse Practitioners) who all work together to provide you with the care you need, when you need it. . You will need a follow up appointment in 4 weeks with Kerin Ransom, PA-C AND IN 6 MONTHS with Dr. Quay Burow.  Please call our office 2 months in advance to schedule each appointment.

## 2019-06-04 NOTE — Assessment & Plan Note (Signed)
History of hyperlipidemia on atorvastatin with lipid profile performed 03/03/2019 revealing total cholesterol 137, LDL 60 and HDL of 63.

## 2019-06-04 NOTE — Assessment & Plan Note (Signed)
History of obstructive sleep apnea not on CPAP. 

## 2019-06-05 ENCOUNTER — Ambulatory Visit (INDEPENDENT_AMBULATORY_CARE_PROVIDER_SITE_OTHER): Payer: Medicare Other | Admitting: Podiatry

## 2019-06-05 ENCOUNTER — Encounter: Payer: Self-pay | Admitting: Podiatry

## 2019-06-05 ENCOUNTER — Other Ambulatory Visit: Payer: Self-pay

## 2019-06-05 ENCOUNTER — Ambulatory Visit: Payer: Self-pay

## 2019-06-05 DIAGNOSIS — M2042 Other hammer toe(s) (acquired), left foot: Secondary | ICD-10-CM | POA: Diagnosis not present

## 2019-06-05 DIAGNOSIS — E1151 Type 2 diabetes mellitus with diabetic peripheral angiopathy without gangrene: Secondary | ICD-10-CM | POA: Diagnosis not present

## 2019-06-05 DIAGNOSIS — E1122 Type 2 diabetes mellitus with diabetic chronic kidney disease: Secondary | ICD-10-CM

## 2019-06-05 DIAGNOSIS — M2041 Other hammer toe(s) (acquired), right foot: Secondary | ICD-10-CM

## 2019-06-05 DIAGNOSIS — B351 Tinea unguium: Secondary | ICD-10-CM

## 2019-06-05 DIAGNOSIS — M79676 Pain in unspecified toe(s): Secondary | ICD-10-CM | POA: Diagnosis not present

## 2019-06-05 DIAGNOSIS — I129 Hypertensive chronic kidney disease with stage 1 through stage 4 chronic kidney disease, or unspecified chronic kidney disease: Secondary | ICD-10-CM

## 2019-06-05 DIAGNOSIS — N183 Chronic kidney disease, stage 3 unspecified: Secondary | ICD-10-CM

## 2019-06-05 DIAGNOSIS — I7389 Other specified peripheral vascular diseases: Secondary | ICD-10-CM

## 2019-06-05 DIAGNOSIS — L84 Corns and callosities: Secondary | ICD-10-CM

## 2019-06-05 DIAGNOSIS — Z008 Encounter for other general examination: Secondary | ICD-10-CM

## 2019-06-05 NOTE — Progress Notes (Signed)
Complaint:  Visit Type: Patient returns to my office for continued preventative foot care services. Complaint: Patient states" my nails have grown long and thick and become painful to walk and wear shoes" Patient has been diagnosed with DM with neuropathy. The patient presents for preventative foot care services. No changes to ROS  Podiatric Exam: Vascular: dorsalis pedis and posterior tibial pulses are palpable bilateral. Capillary return is immediate. Temperature gradient is WNL. Skin turgor WNL  Sensorium: Diminished  Semmes Weinstein monofilament test. Normal tactile sensation bilaterally. Nail Exam: Pt has thick disfigured discolored nails with subungual debris noted bilateral entire nail hallux through fifth toenails Ulcer Exam: There is no evidence of ulcer or pre-ulcerative changes or infection. Orthopedic Exam: Muscle tone and strength are WNL. No limitations in general ROM. No crepitus or effusions noted. Foot type and digits show no abnormalities. Bony prominences are unremarkable.Hammer toes  B/L Skin: No Porokeratosis. No infection or ulcers  Diagnosis:  Onychomycosis, , Pain in right toe, pain in left toes  Treatment & Plan Procedures and Treatment: Consent by patient was obtained for treatment procedures.   Debridement of mycotic and hypertrophic toenails, 1 through 5 bilateral and clearing of subungual debris. No ulceration, no infection noted. Patient is dispensed her diabetic shoes. Return Visit-Office Procedure: Patient instructed to return to the office for a follow up visit 3 months for continued evaluation and treatment.    Gardiner Barefoot DPM

## 2019-06-05 NOTE — Chronic Care Management (AMB) (Signed)
  Chronic Care Management   Outreach Note  06/05/2019 Name: Maria [name] MRN: [id number] DOB: 02/13/1926  Referred by: Glendale Chard, MD Reason for referral : Care Coordination   A second unsuccessful telephone outreach was attempted today. The patient was referred to the case management team for assistance with chronic care management and care coordination.   Follow Up Plan: A HIPPA compliant phone message was left for the patient providing contact information and requesting a return call.  The care management team will reach out to the patient again over the next 10 days.   Daneen Schick, BSW, CDP Social Worker, Certified Dementia Practitioner [facility] [phone]

## 2019-06-05 NOTE — Addendum Note (Signed)
Addended by: Annita Brod on: 06/05/2019 01:44 PM   Modules accepted: Orders

## 2019-06-07 NOTE — Progress Notes (Signed)
Subjective:     Patient ID: Maria [name] , female    DOB: Oct 31, 1925 , 85 y.o.   MRN: [id number]   Chief Complaint  Patient presents with  . Hypertension  . Diabetes    HPI  Hypertension This is a chronic problem. The current episode started more than 1 year ago. The problem has been gradually improving since onset. The problem is controlled. Pertinent negatives include no blurred vision, chest pain, palpitations or shortness of breath. Risk factors for coronary artery disease include dyslipidemia, diabetes mellitus, obesity, post-menopausal state and sedentary lifestyle. Past treatments include diuretics, angiotensin blockers and calcium channel blockers. The current treatment provides moderate improvement. Compliance problems include exercise.  Hypertensive end-organ damage includes kidney disease.  Diabetes She presents for her follow-up diabetic visit. She has type 2 diabetes mellitus. Her disease course has been fluctuating. There are no hypoglycemic associated symptoms. Pertinent negatives for diabetes include no blurred vision and no chest pain. There are no hypoglycemic complications. Diabetic complications include nephropathy. Risk factors for coronary artery disease include diabetes mellitus, dyslipidemia, hypertension, obesity, sedentary lifestyle and post-menopausal.     Past Medical History:  Diagnosis Date  . Anxiety   . Arthritis   . Blood transfusion   . Depression   . Diabetes mellitus   . Edema   . Heart murmur   . Hyperlipidemia   . Hypertension   . Sleep apnea    cpap  . Stroke ([place])    3 x tias  . TIA (transient ischemic attack)      Family History  Problem Relation Age of Onset  . Prostate cancer Other   . Healthy Mother   . Prostate cancer Father      Current Outpatient Medications:  .  Accu-Chek FastClix Lancets MISC, CHECK BLOOD SUGAR BEFORE BREAKFAST AND DINNER, Disp: 306 each, Rfl: 1 .  ACCU-CHEK SMARTVIEW test strip, U TO CHECK BID BEFORE  BRE AND DINNER dx: e11.65, Disp: 300 each, Rfl: 2 .  allopurinol (ZYLOPRIM) 100 MG tablet, Take 2 tablets (200 mg total) by mouth daily, Disp: 60 tablet, Rfl: 0 .  amLODipine (NORVASC) 5 MG tablet, TAKE ONE-HALF (1/2) TABLET DAILY, Disp: 45 tablet, Rfl: 3 .  aspirin 81 MG tablet, Take 1 tablet (81 mg total) by mouth daily., Disp: , Rfl:  .  atorvastatin (LIPITOR) 40 MG tablet, TAKE 1 TABLET DAILY, Disp: 90 tablet, Rfl: 3 .  Cholecalciferol (VITAMIN D) 2000 UNITS tablet, Take 2,000 Units by mouth daily., Disp: , Rfl:  .  citalopram (CELEXA) 20 MG tablet, Take 20 mg by mouth daily., Disp: , Rfl:  .  colchicine 0.6 MG tablet, Take 1 tablet (0.6 mg total) by mouth daily., Disp: 30 tablet, Rfl: 0 .  docusate sodium (COLACE) 100 MG capsule, Take 100 mg by mouth daily., Disp: , Rfl:  .  donepezil (ARICEPT) 10 MG tablet, TAKE 1 TABLET DAILY IN THE EVENING, Disp: 90 tablet, Rfl: 3 .  ezetimibe (ZETIA) 10 MG tablet, TAKE 1 TABLET AT BEDTIME, Disp: 90 tablet, Rfl: 3 .  FOLBIC 2.5-25-2 MG TABS tablet, TAKE 1 TABLET DAILY, Disp: 90 tablet, Rfl: 3 .  furosemide (LASIX) 40 MG tablet, TAKE 1 TABLET TWICE A DAY, Disp: 90 tablet, Rfl: 2 .  hydrALAZINE (APRESOLINE) 25 MG tablet, TAKE 1 TABLET THREE TIMES A DAY, Disp: 270 tablet, Rfl: 3 .  Insulin Pen Needle (BD PEN NEEDLE MICRO U/F) 32G X 6 MM MISC, Use as directed, Disp: 300 each, Rfl: 2 .  isosorbide mononitrate (IMDUR) 30 MG 24 hr tablet, Take 1 tablet (30 mg total) by mouth daily., Disp: 90 tablet, Rfl: 3 .  LANTUS SOLOSTAR 100 UNIT/ML Solostar Pen, INJECT 20 UNITS AT NIGHT UNDER THE SKIN AS PER INSULIN PROTOCOL, Disp: 45 mL, Rfl: 2 .  loratadine (CLARITIN) 10 MG tablet, Take 10 mg by mouth every morning., Disp: , Rfl:  .  metolazone (ZAROXOLYN) 2.5 MG tablet, TAKE 1 TABLET EVERY OTHER DAY 30 MINUTES PRIOR TO YOUR MORNING LASIX DOSE, Disp: 45 tablet, Rfl: 3 .  moxifloxacin (VIGAMOX) 0.5 % ophthalmic solution, INT 1 GTT IN OS QID FOR 14 DAYS, Disp: , Rfl:  .   telmisartan (MICARDIS) 80 MG tablet, TAKE 1 TABLET DAILY, Disp: 90 tablet, Rfl: 3   Allergies  Allergen Reactions  . Ace Inhibitors Other (See Comments)    Angioedema   . Valsartan Other (See Comments)     Review of Systems  Constitutional: Negative.   Eyes: Negative for blurred vision.  Respiratory: Negative.  Negative for shortness of breath.   Cardiovascular: Negative.  Negative for chest pain and palpitations.  Gastrointestinal: Negative.   Musculoskeletal:       She c/o balance issues. Doesn't feel as strong when walking. She is always worried about the possibility of falling. She reports her hands feel weak as well.   Neurological: Negative.   Psychiatric/Behavioral: Negative.      Today's Vitals   06/03/19 0932  BP: 120/64  Pulse: 94  Temp: 98.9 F (37.2 C)  TempSrc: Oral  SpO2: 98%  Weight: 159 lb 9.6 oz (72.4 kg)  Height: 4' 11.4" (1.509 m)   Body mass index is 31.8 kg/m.   Objective:  Physical Exam Vitals signs and nursing note reviewed.  Constitutional:      Appearance: Normal appearance. She is obese.  HENT:     Head: Normocephalic and atraumatic.  Cardiovascular:     Rate and Rhythm: Normal rate and regular rhythm.     Heart sounds: Murmur present.  Pulmonary:     Effort: Pulmonary effort is normal.     Breath sounds: Normal breath sounds.  Skin:    General: Skin is warm.  Neurological:     General: No focal deficit present.     Mental Status: She is alert.  Psychiatric:        Mood and Affect: Mood normal.        Behavior: Behavior normal.         Assessment And Plan:     1. Hypertensive nephropathy  Chronic, well controlled. She will continue with current meds.  She is encouraged to follow a low sat diet.   2. Type 2 diabetes mellitus with stage 3 chronic kidney disease, with long-term current use of insulin ([place])  I will check an a1c today.  Importance of dietary compliance was discussed with the patient.   - Hemoglobin A1c  3.  Palpitations  I will check thyroid fxn and magnesium levels. She is scheduled to f/u with Cardiology in the near future. Pt advised that she may have to wear a monitor.   - TSH - Magnesium  4. Gait abnormality  She agrees to home health evaluation for physical therapy.   - Ambulatory referral to Home Health  5. Decreased grip strength of left hand  She is agreeable to home health OT evaluation.   - Ambulatory referral to [place]  6. Decreased grip strength of right hand  Please see above.   -  Ambulatory referral to Home Health  7. Flu vaccine need  She was given high dose flu vaccine.   8. Drug therapy  - Vitamin B12        Maximino Greenland, MD    THE PATIENT IS ENCOURAGED TO PRACTICE SOCIAL DISTANCING DUE TO THE COVID-19 PANDEMIC.

## 2019-06-08 ENCOUNTER — Ambulatory Visit: Payer: Self-pay

## 2019-06-08 DIAGNOSIS — E782 Mixed hyperlipidemia: Secondary | ICD-10-CM

## 2019-06-08 DIAGNOSIS — E1122 Type 2 diabetes mellitus with diabetic chronic kidney disease: Secondary | ICD-10-CM

## 2019-06-08 DIAGNOSIS — I129 Hypertensive chronic kidney disease with stage 1 through stage 4 chronic kidney disease, or unspecified chronic kidney disease: Secondary | ICD-10-CM

## 2019-06-08 NOTE — Chronic Care Management (AMB) (Signed)
Care Management Note   Maria [name] is a 85 y.o. year old female who is a primary care patient of Glendale Chard, MD . The CM team was consulted for assistance with chronic case management and care coordination.  Review of patient status, including review of consultants reports, rand collaboration with appropriate care team members and the patient's provider was performed as part of comprehensive patient evaluation and provision of care management services. Telephone outreach to patient today to introduce CM services.    Outpatient Encounter Medications as of 06/08/2019  Medication Sig  . Accu-Chek FastClix Lancets MISC CHECK BLOOD SUGAR BEFORE BREAKFAST AND DINNER  . ACCU-CHEK SMARTVIEW test strip U TO CHECK BID BEFORE BRE AND DINNER dx: e11.65  . allopurinol (ZYLOPRIM) 100 MG tablet Take 2 tablets (200 mg total) by mouth daily  . amLODipine (NORVASC) 5 MG tablet TAKE ONE-HALF (1/2) TABLET DAILY  . aspirin 81 MG tablet Take 1 tablet (81 mg total) by mouth daily.  Marland Kitchen atorvastatin (LIPITOR) 40 MG tablet TAKE 1 TABLET DAILY  . Cholecalciferol (VITAMIN D) 2000 UNITS tablet Take 2,000 Units by mouth daily.  . citalopram (CELEXA) 20 MG tablet Take 20 mg by mouth daily.  . colchicine 0.6 MG tablet Take 1 tablet (0.6 mg total) by mouth daily.  Marland Kitchen docusate sodium (COLACE) 100 MG capsule Take 100 mg by mouth daily.  Marland Kitchen donepezil (ARICEPT) 10 MG tablet TAKE 1 TABLET DAILY IN THE EVENING  . ezetimibe (ZETIA) 10 MG tablet TAKE 1 TABLET AT BEDTIME  . FOLBIC 2.5-25-2 MG TABS tablet TAKE 1 TABLET DAILY  . furosemide (LASIX) 40 MG tablet TAKE 1 TABLET TWICE A DAY  . hydrALAZINE (APRESOLINE) 25 MG tablet TAKE 1 TABLET THREE TIMES A DAY  . Insulin Pen Needle (BD PEN NEEDLE MICRO U/F) 32G X 6 MM MISC Use as directed  . isosorbide mononitrate (IMDUR) 30 MG 24 hr tablet Take 1 tablet (30 mg total) by mouth daily.  Marland Kitchen LANTUS SOLOSTAR 100 UNIT/ML Solostar Pen INJECT 20 UNITS AT NIGHT UNDER THE SKIN AS PER  INSULIN PROTOCOL  . loratadine (CLARITIN) 10 MG tablet Take 10 mg by mouth every morning.  . metolazone (ZAROXOLYN) 2.5 MG tablet TAKE 1 TABLET EVERY OTHER DAY 30 MINUTES PRIOR TO YOUR MORNING LASIX DOSE  . moxifloxacin (VIGAMOX) 0.5 % ophthalmic solution INT 1 GTT IN OS QID FOR 14 DAYS  . telmisartan (MICARDIS) 80 MG tablet TAKE 1 TABLET DAILY  . [DISCONTINUED] clonazePAM (KLONOPIN) 0.5 MG tablet Take 0.5 mg by mouth at bedtime.   No facility-administered encounter medications on file as of 06/08/2019.     I reached out to Maria [name] by phone today.   Maria [name] was given information about Chronic Care Management services today including:  1. CCM service includes personalized support from designated clinical staff supervised by her physician, including individualized plan of care and coordination with other care providers 2. 24/7 contact phone numbers for assistance for urgent and routine care needs. 3. Service will only be billed when office clinical staff spend 20 minutes or more in a month to coordinate care. 4. Only one practitioner may furnish and bill the service in a calendar month. 5. The patient may stop CCM services at any time (effective at the end of the month) by phone call to the office staff. 6. The patient will be responsible for cost sharing (co-pay) of up to 20% of the service fee (after annual deductible is met).   Patient agreed to  services and verbal consent obtained.    Follow Up Plan: SW will follow up with patient by phone over the next week to perform SDOH screen.  Daneen Schick, BSW, CDP Social Worker, Certified Dementia Practitioner [place] [facility] [phone]

## 2019-06-09 ENCOUNTER — Telehealth: Payer: Self-pay | Admitting: *Deleted

## 2019-06-09 NOTE — Telephone Encounter (Signed)
14 day ZIO XT long term holter monitor to be mailed to the patients home.  Instructions reviewed briefly as they are included in the monitor kit.

## 2019-06-11 ENCOUNTER — Encounter: Payer: Self-pay | Admitting: Rheumatology

## 2019-06-11 ENCOUNTER — Ambulatory Visit (INDEPENDENT_AMBULATORY_CARE_PROVIDER_SITE_OTHER): Payer: Medicare Other | Admitting: Rheumatology

## 2019-06-11 ENCOUNTER — Other Ambulatory Visit: Payer: Self-pay

## 2019-06-11 ENCOUNTER — Other Ambulatory Visit: Payer: Self-pay | Admitting: Physician Assistant

## 2019-06-11 VITALS — BP 99/54 | HR 100 | Resp 12 | Ht 59.4 in | Wt 162.0 lb

## 2019-06-11 DIAGNOSIS — Z9989 Dependence on other enabling machines and devices: Secondary | ICD-10-CM

## 2019-06-11 DIAGNOSIS — Z5181 Encounter for therapeutic drug level monitoring: Secondary | ICD-10-CM

## 2019-06-11 DIAGNOSIS — M1A09X0 Idiopathic chronic gout, multiple sites, without tophus (tophi): Secondary | ICD-10-CM

## 2019-06-11 DIAGNOSIS — I110 Hypertensive heart disease with heart failure: Secondary | ICD-10-CM

## 2019-06-11 DIAGNOSIS — G4733 Obstructive sleep apnea (adult) (pediatric): Secondary | ICD-10-CM

## 2019-06-11 DIAGNOSIS — N189 Chronic kidney disease, unspecified: Secondary | ICD-10-CM

## 2019-06-11 DIAGNOSIS — N289 Disorder of kidney and ureter, unspecified: Secondary | ICD-10-CM | POA: Diagnosis not present

## 2019-06-11 DIAGNOSIS — Z8673 Personal history of transient ischemic attack (TIA), and cerebral infarction without residual deficits: Secondary | ICD-10-CM

## 2019-06-11 DIAGNOSIS — M65322 Trigger finger, left index finger: Secondary | ICD-10-CM

## 2019-06-11 DIAGNOSIS — I5032 Chronic diastolic (congestive) heart failure: Secondary | ICD-10-CM

## 2019-06-11 DIAGNOSIS — Z8719 Personal history of other diseases of the digestive system: Secondary | ICD-10-CM

## 2019-06-11 DIAGNOSIS — Z8639 Personal history of other endocrine, nutritional and metabolic disease: Secondary | ICD-10-CM

## 2019-06-11 MED ORDER — ALLOPURINOL 300 MG PO TABS: 300.0000 mg | ORAL_TABLET | Freq: Every day | ORAL | 0 refills | Status: DC

## 2019-06-12 ENCOUNTER — Ambulatory Visit: Payer: Self-pay

## 2019-06-12 DIAGNOSIS — E782 Mixed hyperlipidemia: Secondary | ICD-10-CM

## 2019-06-12 DIAGNOSIS — K5730 Diverticulosis of large intestine without perforation or abscess without bleeding: Secondary | ICD-10-CM

## 2019-06-12 DIAGNOSIS — E1122 Type 2 diabetes mellitus with diabetic chronic kidney disease: Secondary | ICD-10-CM

## 2019-06-12 DIAGNOSIS — Z9181 History of falling: Secondary | ICD-10-CM | POA: Diagnosis not present

## 2019-06-12 DIAGNOSIS — N183 Chronic kidney disease, stage 3 unspecified: Secondary | ICD-10-CM | POA: Diagnosis not present

## 2019-06-12 DIAGNOSIS — E785 Hyperlipidemia, unspecified: Secondary | ICD-10-CM

## 2019-06-12 DIAGNOSIS — M109 Gout, unspecified: Secondary | ICD-10-CM

## 2019-06-12 DIAGNOSIS — Z794 Long term (current) use of insulin: Secondary | ICD-10-CM | POA: Diagnosis not present

## 2019-06-12 DIAGNOSIS — F419 Anxiety disorder, unspecified: Secondary | ICD-10-CM

## 2019-06-12 DIAGNOSIS — Z7982 Long term (current) use of aspirin: Secondary | ICD-10-CM | POA: Diagnosis not present

## 2019-06-12 DIAGNOSIS — Z8673 Personal history of transient ischemic attack (TIA), and cerebral infarction without residual deficits: Secondary | ICD-10-CM | POA: Diagnosis not present

## 2019-06-12 DIAGNOSIS — E559 Vitamin D deficiency, unspecified: Secondary | ICD-10-CM

## 2019-06-12 DIAGNOSIS — M1990 Unspecified osteoarthritis, unspecified site: Secondary | ICD-10-CM

## 2019-06-12 DIAGNOSIS — I130 Hypertensive heart and chronic kidney disease with heart failure and stage 1 through stage 4 chronic kidney disease, or unspecified chronic kidney disease: Secondary | ICD-10-CM | POA: Diagnosis not present

## 2019-06-12 DIAGNOSIS — G4733 Obstructive sleep apnea (adult) (pediatric): Secondary | ICD-10-CM

## 2019-06-12 DIAGNOSIS — I5032 Chronic diastolic (congestive) heart failure: Secondary | ICD-10-CM | POA: Diagnosis not present

## 2019-06-12 LAB — CBC WITH DIFFERENTIAL/PLATELET
Absolute Monocytes: 409 cells/uL (ref 200–950)
Basophils Absolute: 31 cells/uL (ref 0–200)
Basophils Relative: 0.5 %
Eosinophils Absolute: 49 cells/uL (ref 15–500)
Eosinophils Relative: 0.8 %
HCT: 29.9 % — ABNORMAL LOW (ref 35.0–45.0)
Hemoglobin: 9.2 g/dL — ABNORMAL LOW (ref 11.7–15.5)
Lymphs Abs: 1232 cells/uL (ref 850–3900)
MCH: 27.2 pg (ref 27.0–33.0)
MCHC: 30.8 g/dL — ABNORMAL LOW (ref 32.0–36.0)
MCV: 88.5 fL (ref 80.0–100.0)
MPV: 12.1 fL (ref 7.5–12.5)
Monocytes Relative: 6.7 %
Neutro Abs: 4380 cells/uL (ref 1500–7800)
Neutrophils Relative %: 71.8 %
Platelets: 218 10*3/uL (ref 140–400)
RBC: 3.38 10*6/uL — ABNORMAL LOW (ref 3.80–5.10)
RDW: 17.1 % — ABNORMAL HIGH (ref 11.0–15.0)
Total Lymphocyte: 20.2 %
WBC: 6.1 10*3/uL (ref 3.8–10.8)

## 2019-06-12 LAB — COMPLETE METABOLIC PANEL WITH GFR
AG Ratio: 1.6 (calc) (ref 1.0–2.5)
ALT: 29 U/L (ref 6–29)
AST: 33 U/L (ref 10–35)
Albumin: 3.6 g/dL (ref 3.6–5.1)
Alkaline phosphatase (APISO): 80 U/L (ref 37–153)
BUN/Creatinine Ratio: 21 (calc) (ref 6–22)
BUN: 33 mg/dL — ABNORMAL HIGH (ref 7–25)
CO2: 28 mmol/L (ref 20–32)
Calcium: 9.4 mg/dL (ref 8.6–10.4)
Chloride: 101 mmol/L (ref 98–110)
Creat: 1.56 mg/dL — ABNORMAL HIGH (ref 0.60–0.88)
GFR, Est African American: 33 mL/min/{1.73_m2} — ABNORMAL LOW (ref >=60)
GFR, Est Non African American: 29 mL/min/{1.73_m2} — ABNORMAL LOW (ref >=60)
Globulin: 2.3 g/dL (calc) (ref 1.9–3.7)
Glucose, Bld: 172 mg/dL — ABNORMAL HIGH (ref 65–99)
Potassium: 3.9 mmol/L (ref 3.5–5.3)
Sodium: 139 mmol/L (ref 135–146)
Total Bilirubin: 0.6 mg/dL (ref 0.2–1.2)
Total Protein: 5.9 g/dL — ABNORMAL LOW (ref 6.1–8.1)

## 2019-06-12 LAB — URIC ACID: Uric Acid, Serum: 5.5 mg/dL (ref 2.5–7.0)

## 2019-06-12 NOTE — Progress Notes (Signed)
Reviewed labs with Dr. Estanislado Pandy.  Labs are stable.  We will continue to monitor closely.  She has chronic anemia and elevated creatinine/low GFR.  Uric acid is within desirable range-5.5.  continue taking allopurinol 300 mg po daily.

## 2019-06-12 NOTE — Chronic Care Management (AMB) (Signed)
  Chronic Care Management   Social Work Note  06/12/2019 Name: Maria [name] MRN: [id number] DOB: 30-Aug-1926  SW placed an outbound call to the patient to perform SDOH (Social Determinants of Health) screen and assist with care coordination. Unfortunately, the patient was unable to complete the screen at this time due to concerns home health PT would arrive while in process.  Follow Up Plan: SW will contact the patient next week to complete intake.  Daneen Schick, BSW, CDP Social Worker, Certified Dementia Practitioner [place] [facility] [phone]

## 2019-06-15 ENCOUNTER — Telehealth: Payer: Self-pay | Admitting: Pharmacist

## 2019-06-15 DIAGNOSIS — I5032 Chronic diastolic (congestive) heart failure: Secondary | ICD-10-CM | POA: Diagnosis not present

## 2019-06-15 DIAGNOSIS — K5730 Diverticulosis of large intestine without perforation or abscess without bleeding: Secondary | ICD-10-CM | POA: Diagnosis not present

## 2019-06-15 DIAGNOSIS — M109 Gout, unspecified: Secondary | ICD-10-CM | POA: Diagnosis not present

## 2019-06-15 DIAGNOSIS — Z7982 Long term (current) use of aspirin: Secondary | ICD-10-CM | POA: Diagnosis not present

## 2019-06-15 DIAGNOSIS — E785 Hyperlipidemia, unspecified: Secondary | ICD-10-CM | POA: Diagnosis not present

## 2019-06-15 DIAGNOSIS — E559 Vitamin D deficiency, unspecified: Secondary | ICD-10-CM | POA: Diagnosis not present

## 2019-06-15 DIAGNOSIS — I130 Hypertensive heart and chronic kidney disease with heart failure and stage 1 through stage 4 chronic kidney disease, or unspecified chronic kidney disease: Secondary | ICD-10-CM | POA: Diagnosis not present

## 2019-06-15 DIAGNOSIS — E782 Mixed hyperlipidemia: Secondary | ICD-10-CM | POA: Diagnosis not present

## 2019-06-15 DIAGNOSIS — M1990 Unspecified osteoarthritis, unspecified site: Secondary | ICD-10-CM | POA: Diagnosis not present

## 2019-06-15 DIAGNOSIS — G4733 Obstructive sleep apnea (adult) (pediatric): Secondary | ICD-10-CM | POA: Diagnosis not present

## 2019-06-15 DIAGNOSIS — Z8673 Personal history of transient ischemic attack (TIA), and cerebral infarction without residual deficits: Secondary | ICD-10-CM | POA: Diagnosis not present

## 2019-06-15 DIAGNOSIS — E1122 Type 2 diabetes mellitus with diabetic chronic kidney disease: Secondary | ICD-10-CM | POA: Diagnosis not present

## 2019-06-15 DIAGNOSIS — Z794 Long term (current) use of insulin: Secondary | ICD-10-CM | POA: Diagnosis not present

## 2019-06-15 DIAGNOSIS — N183 Chronic kidney disease, stage 3 unspecified: Secondary | ICD-10-CM | POA: Diagnosis not present

## 2019-06-15 DIAGNOSIS — Z9181 History of falling: Secondary | ICD-10-CM | POA: Diagnosis not present

## 2019-06-15 NOTE — Telephone Encounter (Signed)
-----   Message from [place] sent at 06/15/2019  2:33 PM EDT ----- Advised patient of lab results, patient verbalized understanding. Patient had several questions about her medications, call was transferred to Mariella Saa, PharmD.

## 2019-06-15 NOTE — Telephone Encounter (Signed)
Patient transferred to my desk due to medication questions.  She had diarrhea the past few nights.  She wanted to know if this could be due to allopurinol or Colcrys.  Informed patient that since she has been on both medications for a long period of time highly unlikely her diarrhea is due to his medications.  She mentioned that Dr. Baird Cancer started her on magnesium supplement recently.  Patient is taking magnesium oxide daily.  Informed patient that magnesium oxide is likely the culprit of her diarrhea as it is also sometimes used as a laxative.  She is also taking stool softener daily.  Patient verbalized understanding.  Advised patient that switching to magnesium malate might lessen her GI symptoms.  She recently bought the supplements which were expensive and has a large amount left.  She would like to finish the supplements she does have if able.  Advised patient to stop her daily stool softener to see if that helps improve her symptoms.  If she does not notice an improvement in her symptoms advised patient to contact Dr. Baird Cancer about discontinuing magnesium and/or switching to magnesium malate to decrease GI upset.  Patient verbalized understanding.  All questions encouraged and answered.  Instructed patient to call with any further questions or concerns.  Mariella Saa, PharmD, [place] [place] Pharmacist [phone]  06/15/2019 2:46 PM

## 2019-06-17 ENCOUNTER — Ambulatory Visit: Payer: Self-pay

## 2019-06-17 ENCOUNTER — Telehealth: Payer: Self-pay

## 2019-06-17 DIAGNOSIS — I129 Hypertensive chronic kidney disease with stage 1 through stage 4 chronic kidney disease, or unspecified chronic kidney disease: Secondary | ICD-10-CM

## 2019-06-17 DIAGNOSIS — Z794 Long term (current) use of insulin: Secondary | ICD-10-CM

## 2019-06-17 DIAGNOSIS — E1122 Type 2 diabetes mellitus with diabetic chronic kidney disease: Secondary | ICD-10-CM

## 2019-06-17 DIAGNOSIS — E782 Mixed hyperlipidemia: Secondary | ICD-10-CM

## 2019-06-17 NOTE — Chronic Care Management (AMB) (Signed)
Chronic Care Management   Initial Visit Note  06/17/2019 Name: Maria [name] MRN: [id number] DOB: [birth year]  Referred by: Glendale Chard, MD Reason for referral : Chronic Care Management (CCM RNCM Telephone Follow up )   Maria [name] is a 85 y.o. year old female who is a primary care patient of Glendale Chard, MD. The CCM team was consulted for assistance with chronic disease management and care coordination needs related to Weakness  Review of patient status, including review of consultants reports, relevant laboratory and other test results, and collaboration with appropriate care team members and the patient's provider was performed as part of comprehensive patient evaluation and provision of chronic care management services.    I spoke with Maria [name] by telephone today in a joint call with embedded BSW Daneen Schick to follow up on Maria [name] c/o sudden onset of weakness.   Medications: Outpatient Encounter Medications as of 06/17/2019  Medication Sig  . Accu-Chek FastClix Lancets MISC CHECK BLOOD SUGAR BEFORE BREAKFAST AND DINNER  . ACCU-CHEK SMARTVIEW test strip U TO CHECK BID BEFORE BRE AND DINNER dx: e11.65  . allopurinol (ZYLOPRIM) 300 MG tablet Take 1 tablet (300 mg total) by mouth daily.  Marland Kitchen amLODipine (NORVASC) 5 MG tablet TAKE ONE-HALF (1/2) TABLET DAILY  . aspirin 81 MG tablet Take 1 tablet (81 mg total) by mouth daily.  Marland Kitchen atorvastatin (LIPITOR) 40 MG tablet TAKE 1 TABLET DAILY  . Cholecalciferol (VITAMIN D) 2000 UNITS tablet Take 2,000 Units by mouth daily.  . citalopram (CELEXA) 20 MG tablet Take 20 mg by mouth daily.  . colchicine 0.6 MG tablet Take 1 tablet (0.6 mg total) by mouth daily.  Marland Kitchen docusate sodium (COLACE) 100 MG capsule Take 100 mg by mouth daily.  Marland Kitchen donepezil (ARICEPT) 10 MG tablet TAKE 1 TABLET DAILY IN THE EVENING  . ezetimibe (ZETIA) 10 MG tablet TAKE 1 TABLET AT BEDTIME  . FOLBIC 2.5-25-2 MG TABS tablet TAKE 1 TABLET DAILY  . furosemide (LASIX)  40 MG tablet TAKE 1 TABLET TWICE A DAY  . hydrALAZINE (APRESOLINE) 25 MG tablet TAKE 1 TABLET THREE TIMES A DAY  . Insulin Pen Needle (BD PEN NEEDLE MICRO U/F) 32G X 6 MM MISC Use as directed  . isosorbide mononitrate (IMDUR) 30 MG 24 hr tablet Take 1 tablet (30 mg total) by mouth daily.  Marland Kitchen LANTUS SOLOSTAR 100 UNIT/ML Solostar Pen INJECT 20 UNITS AT NIGHT UNDER THE SKIN AS PER INSULIN PROTOCOL  . loratadine (CLARITIN) 10 MG tablet Take 10 mg by mouth every morning.  . Magnesium 250 MG TABS Take by mouth at bedtime.  . metolazone (ZAROXOLYN) 2.5 MG tablet TAKE 1 TABLET EVERY OTHER DAY 30 MINUTES PRIOR TO YOUR MORNING LASIX DOSE  . moxifloxacin (VIGAMOX) 0.5 % ophthalmic solution INT 1 GTT IN OS QID FOR 14 DAYS  . telmisartan (MICARDIS) 80 MG tablet TAKE 1 TABLET DAILY  . [DISCONTINUED] clonazePAM (KLONOPIN) 0.5 MG tablet Take 0.5 mg by mouth at bedtime.   No facility-administered encounter medications on file as of 06/17/2019.      Objective:  Lab Results  Component Value Date   HGBA1C 6.9 (H) 06/03/2019   HGBA1C 6.7 (H) 03/03/2019   HGBA1C 9.0 (H) 10/01/2018   Lab Results  Component Value Date   MICROALBUR 10 03/03/2019   LDLCALC 60 03/03/2019   CREATININE 1.56 (H) 06/11/2019   BP Readings from Last 3 Encounters:  06/11/19 (!) 99/54  06/04/19 (!) 148/70  06/03/19 120/64  Goals Addressed      Patient Stated   . "I just feel weak" (pt-stated)       Current Barriers:  Marland Kitchen Knowledge Deficits related to acute onset of "weakness"   Nurse Case Manager Clinical Goal(s):  Marland Kitchen Over the next 14 days, patient will report the acute onset of weakness has resolved as evidence by patient will have increased energy and will be able to complete her daily activities w/o difficulty or fatigue. . Over the next 30 days, patient will not experience ED visits and or IP admission.   CCM RN CM Interventions:  06/17/19 call completed with the patient and embedded BSW [facility]   . Evaluation of  current treatment plan related to new onset of weakness and patient's adherence to plan as established by provider; discussed patient has never experienced sudden onset of weakness as she has today . Discussed patient is currently wearing a 14 day Holter monitor prescribed by Dr. Gwenlyn Found; she denies noticing shortness of breath, chest pain or heart palpitations . Reviewed medications with patient and discussed patient was recently prescribed magnesium and was taking as directed along with a stool softener; discussed this caused her some GI upset with loose stools for several days; discussed patient reported the Cardiologist nurse who instructed her to stop the stool softener; discussed patient stopped the stool softener and the Magnesium x 2 days ago; patient's last reported BM was yesterday on 06/16/19 with soft formed stool noted . Collaborated with Dr. Baird Cancer regarding patient's acute onset of weakness and reported GI upset after starting Mg . Advised patient to drink plenty of water to stay well hydrated; in addition and with providing rationale instructed patient to drink Gatorade and or coconut water to help replace electrolytes as directed by Dr. Baird Cancer -patient verbalizes understanding and will ask friend Maria [name] to provide her with these fluids ASAP . Instructed patient to check her BP if able during the call; patient reports BP is 149/77, HR 84; Encouraged patient to take her time when changing positions to help avoid sudden change in BP . Discussed plans with patient for ongoing care management follow up and provided patient with direct contact information for care management team . Discussed CCM RN will follow up with [name] tomorrow am; patient is agreeable, discussed she will also have an aid come in to assist her tomorrow for 2 hours  Patient Self Care Activities:  . Self administers medications as prescribed . Attends all scheduled provider appointments . Calls pharmacy for  medication refills . Performs ADL's independently . Performs IADL's independently . Calls provider office for new concerns or questions   Initial goal documentation         Plan:   Telephone follow up appointment with care management team member scheduled for: 06/17/19  Barb Merino, RN, BSN, CCM Care Management Coordinator [facility]  Direct Phone: [phone]

## 2019-06-17 NOTE — Patient Instructions (Signed)
Visit Information  Goals Addressed      Patient Stated   . "I just feel weak" (pt-stated)       Current Barriers:  Marland Kitchen Knowledge Deficits related to acute onset of "weakness"   Nurse Case Manager Clinical Goal(s):  Marland Kitchen Over the next 14 days, patient will report the acute onset of weakness has resolved as evidence by patient will have increased energy and will be able to complete her daily activities w/o difficulty or fatigue. . Over the next 30 days, patient will not experience ED visits and or IP admission.   CCM RN CM Interventions:  06/17/19 call completed with the patient and embedded BSW [facility]   . Evaluation of current treatment plan related to new onset of weakness and patient's adherence to plan as established by provider; discussed patient has never experienced sudden onset of weakness as she has today . Discussed patient is currently wearing a 14 day Holter monitor prescribed by Dr. Gwenlyn Found; she denies noticing shortness of breath, chest pain or heart palpitations . Reviewed medications with patient and discussed patient was recently prescribed magnesium and was taking as directed along with a stool softener; discussed this caused her some GI upset with loose stools for several days; discussed patient reported the Cardiologist nurse who instructed her to stop the stool softener; discussed patient stopped the stool softener and the Magnesium x 2 days ago; patient's last reported BM was yesterday on 06/16/19 with soft formed stool noted . Collaborated with Dr. Baird Cancer regarding patient's acute onset of weakness and reported GI upset after starting Mg . Advised patient to drink plenty of water to stay well hydrated; in addition and with providing rationale instructed patient to drink Gatorade and or coconut water to help replace electrolytes as directed by Dr. Baird Cancer -patient verbalizes understanding and will ask friend [name] to provide her with these fluids ASAP . Instructed patient  to check her BP if able during the call; patient reports BP is 149/77, HR 84; Encouraged patient to take her time when changing positions to help avoid sudden change in BP . Discussed plans with patient for ongoing care management follow up and provided patient with direct contact information for care management team . Discussed CCM RN will follow up with Ms. Maddy tomorrow am; patient is agreeable, discussed she will also have an aid come in to assist her tomorrow for 2 hours  Patient Self Care Activities:  . Self administers medications as prescribed . Attends all scheduled provider appointments . Calls pharmacy for medication refills . Performs ADL's independently . Performs IADL's independently . Calls provider office for new concerns or questions   Initial goal documentation        The patient verbalized understanding of instructions provided today and declined a print copy of patient instruction materials.   Telephone follow up appointment with care management team member scheduled for: 06/18/19  Barb Merino, RN, BSN, CCM Care Management Coordinator [place] [facility]  Direct Phone: [phone]

## 2019-06-17 NOTE — Chronic Care Management (AMB) (Signed)
  Chronic Care Management   Social Work Note  06/17/2019 Name: Maria [name] MRN: [id number] DOB: [birth year]  Incoming call received from the patient returning SW call. Patient reports feeling "out of it" upon returning from a recent hair appointment this morning. "I have been laying here and heard the phone ring but I've just been out of it. I couldn't answer it". Chart review performed to note recent complaints of diarrhea from the patent on 06/15/2019.  SW collaborated with RN Case Freight forwarder regarding patient complaint and need for clinical assessment regarding current health state. SW assisted in joint call between the patient and RN Case Manager. See RN note for further intervention during today's call.   Follow Up Plan: SW will follow up with the patient in the next week regarding SDOH screen.  Daneen Schick, BSW, CDP Social Worker, Certified Dementia Practitioner [place] [facility] [place] [facility] [phone]

## 2019-06-17 NOTE — Chronic Care Management (AMB) (Signed)
  Chronic Care Management   Social Work Note  06/17/2019 Name: CHLOEY SCHIESSL MRN: [id number] DOB: 06-27-1926  SW placed an unsuccessful outbound call to the patient to perform an SDOH screen. HIPAA compliant voice message requesting a return call.   Follow Up Plan: SW will follow up with patient by phone over the next 10 days.  Daneen Schick, BSW, CDP Social Worker, Certified Dementia Practitioner [place] [facility] [place] [facility] [phone]

## 2019-06-18 ENCOUNTER — Telehealth: Payer: Self-pay

## 2019-06-18 ENCOUNTER — Other Ambulatory Visit: Payer: Self-pay

## 2019-06-18 ENCOUNTER — Ambulatory Visit: Payer: Self-pay

## 2019-06-18 DIAGNOSIS — I129 Hypertensive chronic kidney disease with stage 1 through stage 4 chronic kidney disease, or unspecified chronic kidney disease: Secondary | ICD-10-CM

## 2019-06-18 DIAGNOSIS — E785 Hyperlipidemia, unspecified: Secondary | ICD-10-CM | POA: Diagnosis not present

## 2019-06-18 DIAGNOSIS — Z9181 History of falling: Secondary | ICD-10-CM | POA: Diagnosis not present

## 2019-06-18 DIAGNOSIS — I5032 Chronic diastolic (congestive) heart failure: Secondary | ICD-10-CM | POA: Diagnosis not present

## 2019-06-18 DIAGNOSIS — E1122 Type 2 diabetes mellitus with diabetic chronic kidney disease: Secondary | ICD-10-CM

## 2019-06-18 DIAGNOSIS — M109 Gout, unspecified: Secondary | ICD-10-CM | POA: Diagnosis not present

## 2019-06-18 DIAGNOSIS — E559 Vitamin D deficiency, unspecified: Secondary | ICD-10-CM | POA: Diagnosis not present

## 2019-06-18 DIAGNOSIS — E782 Mixed hyperlipidemia: Secondary | ICD-10-CM

## 2019-06-18 DIAGNOSIS — Z8673 Personal history of transient ischemic attack (TIA), and cerebral infarction without residual deficits: Secondary | ICD-10-CM | POA: Diagnosis not present

## 2019-06-18 DIAGNOSIS — Z794 Long term (current) use of insulin: Secondary | ICD-10-CM | POA: Diagnosis not present

## 2019-06-18 DIAGNOSIS — M1990 Unspecified osteoarthritis, unspecified site: Secondary | ICD-10-CM | POA: Diagnosis not present

## 2019-06-18 DIAGNOSIS — Z7982 Long term (current) use of aspirin: Secondary | ICD-10-CM | POA: Diagnosis not present

## 2019-06-18 DIAGNOSIS — K5730 Diverticulosis of large intestine without perforation or abscess without bleeding: Secondary | ICD-10-CM | POA: Diagnosis not present

## 2019-06-18 DIAGNOSIS — I130 Hypertensive heart and chronic kidney disease with heart failure and stage 1 through stage 4 chronic kidney disease, or unspecified chronic kidney disease: Secondary | ICD-10-CM | POA: Diagnosis not present

## 2019-06-18 DIAGNOSIS — N183 Chronic kidney disease, stage 3 unspecified: Secondary | ICD-10-CM | POA: Diagnosis not present

## 2019-06-18 DIAGNOSIS — G4733 Obstructive sleep apnea (adult) (pediatric): Secondary | ICD-10-CM | POA: Diagnosis not present

## 2019-06-18 NOTE — Telephone Encounter (Signed)
Called to check on pt she is doing better and is resting today  The physical therapist was there and angel also called to check on her. She is appreciative of the calls to check on her.

## 2019-06-19 NOTE — Patient Instructions (Addendum)
Visit Information  Goals Addressed      Patient Stated   . "I have Congestive Heart Failue" (pt-stated)       Current Barriers:  Marland Kitchen Knowledge Deficits related to Self health management for CHF  Nurse Case Manager Clinical Goal(s):  Marland Kitchen Over the next 30 days, patient will work with the CCM RN to address needs related to CHF  CCM RN CM Interventions:  06/18/19 call completed with patient  Discussed patient is wearing a 14 day Holter monitor for evaluation of her heart rhythm due to CHF, prescribed by Dr. Gwenlyn Found . Evaluation of current treatment plan related to CHF and patient's adherence to plan as established by provider. . Reviewed medications with patient and discussed patient has a good understanding of her complete medication regimen, including the indication, dosage and frequency of her meds; she reports adherence and would like to review her meds at next CCM follow up . Discussed plans with patient for ongoing care management follow up and provided patient with direct contact information for care management team . Advised patient, providing education and rationale, to weigh daily and record, calling the CCM team and MD Baird Cancer or Gwenlyn Found) for weight gain of 3lbs overnight or 5 pounds in a week.   Patient Self Care Activities:  . Self administers medications as prescribed . Attends all scheduled provider appointments . Calls pharmacy for medication refills . Attends church or other social activities . Performs ADL's independently . Performs IADL's independently . Calls provider office for new concerns or questions  Initial goal documentation     . "I just feel weak" (pt-stated)       Current Barriers:  Marland Kitchen Knowledge Deficits related to acute onset of "weakness"   Nurse Case Manager Clinical Goal(s):  Marland Kitchen Over the next 14 days, patient will report the acute onset of weakness has resolved as evidence by patient will have increased energy and will be able to complete her daily  activities w/o difficulty or fatigue. . Over the next 30 days, patient will not experience ED visits and or IP admission.   CCM RN CM Interventions:  06/18/19 call completed with the patient   . Evaluation of current treatment plan related to new onset of weakness and patient's adherence to plan as established by provider; discussed patient began drinking Gatorade last evening as directed by Dr. Baird Cancer and continues to do so; discussed she is feeling much better this morning; her CBG is 101 and her BP 133/72, Pulse 84, Oxygen Sat % 98, vitals were taken by PT and the patient completed her therapy w/o difficulty . Discussed she continues to wear the 14 day Holter monitor prescribed by Dr. Gwenlyn Found; she denies noticing shortness of breath, chest pain or heart palpitations . Encouraged patient to continue Gatorade & drink plenty of water to stay well hydrated . Discussed plans with patient for ongoing care management follow up and provided patient with direct contact information for care management team  Patient Self Care Activities:  . Self administers medications as prescribed . Attends all scheduled provider appointments . Calls pharmacy for medication refills . Performs ADL's independently . Performs IADL's independently . Calls provider office for new concerns or questions   Please see past updates related to this goal by clicking on the "Past Updates" button in the selected goal         The patient verbalized understanding of instructions provided today and declined a print copy of patient instruction materials.   Telephone follow up  appointment with care management team member scheduled for: 07/03/19  Barb Merino, RN, BSN, CCM Care Management Coordinator [place] [facility]  Direct Phone: [phone]

## 2019-06-19 NOTE — Chronic Care Management (AMB) (Signed)
Chronic Care Management   Follow Up Note   06/18/2019 Name: TAKEISHA CARRICO MRN: [id number] DOB: 1925-11-03  Referred by: Glendale Chard, MD Reason for referral : Chronic Care Management (CCM RNCM Telephone follow up)   LIVINIA WRENCH is a 85 y.o. year old female who is a primary care patient of Glendale Chard, MD. The CCM team was consulted for assistance with chronic disease management and care coordination needs.    Review of patient status, including review of consultants reports, relevant laboratory and other test results, and collaboration with appropriate care team members and the patient's provider was performed as part of comprehensive patient evaluation and provision of chronic care management services.    SDOH (Social Determinants of Health) screening performed today: None. See Care Plan for related entries.   Advanced Directives Status: N See Care Plan and [facility]yn[employer] application for related entries.  I spoke with Ms. Torrico by telephone today to follow up on her c/o weakness.   Outpatient Encounter Medications as of 06/18/2019  Medication Sig  . Accu-Chek FastClix Lancets MISC CHECK BLOOD SUGAR BEFORE BREAKFAST AND DINNER  . ACCU-CHEK SMARTVIEW test strip U TO CHECK BID BEFORE BRE AND DINNER dx: e11.65  . allopurinol (ZYLOPRIM) 300 MG tablet Take 1 tablet (300 mg total) by mouth daily.  Marland Kitchen amLODipine (NORVASC) 5 MG tablet TAKE ONE-HALF (1/2) TABLET DAILY  . aspirin 81 MG tablet Take 1 tablet (81 mg total) by mouth daily.  Marland Kitchen atorvastatin (LIPITOR) 40 MG tablet TAKE 1 TABLET DAILY  . Cholecalciferol (VITAMIN D) 2000 UNITS tablet Take 2,000 Units by mouth daily.  . citalopram (CELEXA) 20 MG tablet Take 20 mg by mouth daily.  . colchicine 0.6 MG tablet Take 1 tablet (0.6 mg total) by mouth daily.  Marland Kitchen docusate sodium (COLACE) 100 MG capsule Take 100 mg by mouth daily.  Marland Kitchen donepezil (ARICEPT) 10 MG tablet TAKE 1 TABLET DAILY IN THE EVENING  . ezetimibe (ZETIA) 10 MG tablet TAKE 1  TABLET AT BEDTIME  . FOLBIC 2.5-25-2 MG TABS tablet TAKE 1 TABLET DAILY  . furosemide (LASIX) 40 MG tablet TAKE 1 TABLET TWICE A DAY  . hydrALAZINE (APRESOLINE) 25 MG tablet TAKE 1 TABLET THREE TIMES A DAY  . Insulin Pen Needle (BD PEN NEEDLE MICRO U/F) 32G X 6 MM MISC Use as directed  . isosorbide mononitrate (IMDUR) 30 MG 24 hr tablet Take 1 tablet (30 mg total) by mouth daily.  Marland Kitchen LANTUS SOLOSTAR 100 UNIT/ML Solostar Pen INJECT 20 UNITS AT NIGHT UNDER THE SKIN AS PER INSULIN PROTOCOL  . loratadine (CLARITIN) 10 MG tablet Take 10 mg by mouth every morning.  . Magnesium 250 MG TABS Take by mouth at bedtime.  . metolazone (ZAROXOLYN) 2.5 MG tablet TAKE 1 TABLET EVERY OTHER DAY 30 MINUTES PRIOR TO YOUR MORNING LASIX DOSE  . moxifloxacin (VIGAMOX) 0.5 % ophthalmic solution INT 1 GTT IN OS QID FOR 14 DAYS  . telmisartan (MICARDIS) 80 MG tablet TAKE 1 TABLET DAILY   No facility-administered encounter medications on file as of 06/18/2019.      Goals Addressed      Patient Stated   . "I have Congestive Heart Failue" (pt-stated)       Current Barriers:  Marland Kitchen Knowledge Deficits related to Self health management for CHF  Nurse Case Manager Clinical Goal(s):  Marland Kitchen Over the next 30 days, patient will work with the CCM RN to address needs related to CHF  CCM RN CM Interventions:  06/19/19 call  completed with patient  Discussed patient is wearing a 14 day Holter monitor for evaluation of her heart rhythm due to CHF, prescribed by Dr. Gwenlyn Found . Evaluation of current treatment plan related to CHF and patient's adherence to plan as established by provider. . Reviewed medications with patient and discussed patient has a good understanding of her complete medication regimen, including the indication, dosage and frequency of her meds; she reports adherence and would like to review her meds at next CCM follow up . Discussed plans with patient for ongoing care management follow up and provided patient with direct  contact information for care management team . Advised patient, providing education and rationale, to weigh daily and record, calling the CCM team and MD Baird Cancer or Gwenlyn Found) for weight gain of 3lbs overnight or 5 pounds in a week.   Patient Self Care Activities:  . Self administers medications as prescribed . Attends all scheduled provider appointments . Calls pharmacy for medication refills . Attends church or other social activities . Performs ADL's independently . Performs IADL's independently . Calls provider office for new concerns or questions  Initial goal documentation     . "I just feel weak" (pt-stated)       Current Barriers:  Marland Kitchen Knowledge Deficits related to acute onset of "weakness"   Nurse Case Manager Clinical Goal(s):  Marland Kitchen Over the next 14 days, patient will report the acute onset of weakness has resolved as evidence by patient will have increased energy and will be able to complete her daily activities w/o difficulty or fatigue. . Over the next 30 days, patient will not experience ED visits and or IP admission.   CCM RN CM Interventions:  06/18/19 call completed with the patient   . Evaluation of current treatment plan related to new onset of weakness and patient's adherence to plan as established by provider; discussed patient began drinking Gatorade last evening as directed by Dr. Baird Cancer and continues to do so; discussed she is feeling much better this morning; her CBG is 101 and her BP 133/72, Pulse 84, Oxygen Sat % 98, vitals were taken by PT and the patient completed her therapy w/o difficulty . Discussed she continues to wear the 14 day Holter monitor prescribed by Dr. Gwenlyn Found; she denies noticing shortness of breath, chest pain or heart palpitations . Encouraged patient to continue Gatorade & drink plenty of water to stay well hydrated . Discussed plans with patient for ongoing care management follow up and provided patient with direct contact information for care  management team  Patient Self Care Activities:  . Self administers medications as prescribed . Attends all scheduled provider appointments . Calls pharmacy for medication refills . Performs ADL's independently . Performs IADL's independently . Calls provider office for new concerns or questions   Please see past updates related to this goal by clicking on the "Past Updates" button in the selected goal          Telephone follow up appointment with care management team member scheduled for: 07/03/19   Barb Merino, RN, BSN, CCM Care Management Coordinator [facility]  Direct Phone: [phone]

## 2019-06-22 DIAGNOSIS — Z794 Long term (current) use of insulin: Secondary | ICD-10-CM | POA: Diagnosis not present

## 2019-06-22 DIAGNOSIS — Z9841 Cataract extraction status, right eye: Secondary | ICD-10-CM | POA: Diagnosis not present

## 2019-06-22 DIAGNOSIS — Z9842 Cataract extraction status, left eye: Secondary | ICD-10-CM | POA: Diagnosis not present

## 2019-06-22 DIAGNOSIS — E11319 Type 2 diabetes mellitus with unspecified diabetic retinopathy without macular edema: Secondary | ICD-10-CM | POA: Diagnosis not present

## 2019-06-22 DIAGNOSIS — H18423 Band keratopathy, bilateral: Secondary | ICD-10-CM | POA: Diagnosis not present

## 2019-06-22 DIAGNOSIS — Z961 Presence of intraocular lens: Secondary | ICD-10-CM | POA: Diagnosis not present

## 2019-06-23 ENCOUNTER — Encounter: Payer: Self-pay | Admitting: Pharmacist

## 2019-06-23 ENCOUNTER — Ambulatory Visit: Payer: Self-pay

## 2019-06-23 DIAGNOSIS — G4733 Obstructive sleep apnea (adult) (pediatric): Secondary | ICD-10-CM | POA: Diagnosis not present

## 2019-06-23 DIAGNOSIS — Z9181 History of falling: Secondary | ICD-10-CM | POA: Diagnosis not present

## 2019-06-23 DIAGNOSIS — Z8673 Personal history of transient ischemic attack (TIA), and cerebral infarction without residual deficits: Secondary | ICD-10-CM | POA: Diagnosis not present

## 2019-06-23 DIAGNOSIS — N183 Chronic kidney disease, stage 3 unspecified: Secondary | ICD-10-CM | POA: Diagnosis not present

## 2019-06-23 DIAGNOSIS — M109 Gout, unspecified: Secondary | ICD-10-CM | POA: Diagnosis not present

## 2019-06-23 DIAGNOSIS — E785 Hyperlipidemia, unspecified: Secondary | ICD-10-CM | POA: Diagnosis not present

## 2019-06-23 DIAGNOSIS — Z794 Long term (current) use of insulin: Secondary | ICD-10-CM

## 2019-06-23 DIAGNOSIS — Z7982 Long term (current) use of aspirin: Secondary | ICD-10-CM | POA: Diagnosis not present

## 2019-06-23 DIAGNOSIS — I130 Hypertensive heart and chronic kidney disease with heart failure and stage 1 through stage 4 chronic kidney disease, or unspecified chronic kidney disease: Secondary | ICD-10-CM | POA: Diagnosis not present

## 2019-06-23 DIAGNOSIS — I5032 Chronic diastolic (congestive) heart failure: Secondary | ICD-10-CM | POA: Diagnosis not present

## 2019-06-23 DIAGNOSIS — K5730 Diverticulosis of large intestine without perforation or abscess without bleeding: Secondary | ICD-10-CM | POA: Diagnosis not present

## 2019-06-23 DIAGNOSIS — E1122 Type 2 diabetes mellitus with diabetic chronic kidney disease: Secondary | ICD-10-CM | POA: Diagnosis not present

## 2019-06-23 DIAGNOSIS — E559 Vitamin D deficiency, unspecified: Secondary | ICD-10-CM | POA: Diagnosis not present

## 2019-06-23 DIAGNOSIS — M1990 Unspecified osteoarthritis, unspecified site: Secondary | ICD-10-CM | POA: Diagnosis not present

## 2019-06-23 DIAGNOSIS — E782 Mixed hyperlipidemia: Secondary | ICD-10-CM | POA: Diagnosis not present

## 2019-06-23 NOTE — Patient Instructions (Signed)
Licensed Clinical Social Worker Visit Information  Goals we discussed today:  Goals Addressed            This Visit's Progress     Patient Stated   . "I need to get my eye drops from the pharmacy" (pt-stated)       Current Barriers:  . Financial constraints related to cost of medication  Clinical Social Work Clinical Goal(s):  Marland Kitchen Over the next 2 days the patient will obtain eye drops from her local pharmacy  SW Interventions: Completed 06/23/2019 . Patient interviewed and appropriate assessments performed . Determined the patient underwent an eye surgery "yesterday" to her left eye. The patient reports having the same procedure done to her right eye in June . Assessed for patient care of left eye since returning home. Advised by the patient she is to use drops QID and sleep with a mask over her eye . Assessed for medication affordability concerns. The patient does identify financial limitations reporting she is currently using the leftover drops from her procedure to the right eye "the pharmacy said it will cost $107 and that will take all I have the rest of the month" . Discussed involving embedded PharmD to look into alternative options for cost savings. The patient reports she has someone coming today to pick up money to obtain prescriptions . Advised the patient SW would communicate to pharmacist in the event she could assist the patient  . Collaboration with RN Case Manager to advise of patients recent procedure to left eye . Collaboration with embedded PharmD to request assistance with acute medication needs  Patient Self Care Activities:  . Attends all scheduled provider appointments . Calls pharmacy for medication refills . Calls provider office for new concerns or questions  Initial goal documentation       Other   . Assist with care coordination by conducting social determinants of health screen       Current Barriers:  Marland Kitchen Knowledge Barriers related to resources and  support available to address needs related to Chronic Care Management and challenges surrounding Social Determinants of Health  Clinical Social Work Clinical Goal(s):   Over the next 20 days, the patient will understand the role of the CCM team and work with SW to complete SDOH (Social Determinants of Health) screen.  Interventions:  Outbound call placed to the patient to conduct SDOH screen  Determined the patient no longer drives self but does have access to SCAT services  Unable to complete assessment due to patients PT arriving in the home  Scheduled follow up call to the patient over the next 3 days  Patient Self Care Activities:   Calls provider office with new concerns  Performs ADL's independently  Initial goal documentation:         Follow Up Plan: SW will follow up with patient by phone over the next 3 days.   Daneen Schick, BSW, CDP Social Worker, Certified Dementia Practitioner [place] [facility] [place] [facility] [phone]

## 2019-06-23 NOTE — Chronic Care Management (AMB) (Signed)
Chronic Care Management   Social Work Follow Up Note  06/23/2019 Name: Maria [name] MRN: [id number] DOB: 06-30-1926  Maria [name] is a 85 y.o. year old female who is a primary care patient of Glendale Chard, MD. The CCM team was consulted for assistance with care coordination.   Review of patient status, including review of consultants reports, other relevant assessments, and collaboration with appropriate care team members and the patient's provider was performed as part of comprehensive patient evaluation and provision of chronic care management services.    SW placed a follow up call to the patient to assist with patient goal related to prescription costs. See care plan below.  Outpatient Encounter Medications as of 06/23/2019  Medication Sig  . Accu-Chek FastClix Lancets MISC CHECK BLOOD SUGAR BEFORE BREAKFAST AND DINNER  . ACCU-CHEK SMARTVIEW test strip U TO CHECK BID BEFORE BRE AND DINNER dx: e11.65  . allopurinol (ZYLOPRIM) 300 MG tablet Take 1 tablet (300 mg total) by mouth daily.  Marland Kitchen amLODipine (NORVASC) 5 MG tablet TAKE ONE-HALF (1/2) TABLET DAILY  . aspirin 81 MG tablet Take 1 tablet (81 mg total) by mouth daily.  Marland Kitchen atorvastatin (LIPITOR) 40 MG tablet TAKE 1 TABLET DAILY  . Cholecalciferol (VITAMIN D) 2000 UNITS tablet Take 2,000 Units by mouth daily.  . citalopram (CELEXA) 20 MG tablet Take 20 mg by mouth daily.  . colchicine 0.6 MG tablet Take 1 tablet (0.6 mg total) by mouth daily.  Marland Kitchen docusate sodium (COLACE) 100 MG capsule Take 100 mg by mouth daily.  Marland Kitchen donepezil (ARICEPT) 10 MG tablet TAKE 1 TABLET DAILY IN THE EVENING  . ezetimibe (ZETIA) 10 MG tablet TAKE 1 TABLET AT BEDTIME  . FOLBIC 2.5-25-2 MG TABS tablet TAKE 1 TABLET DAILY  . furosemide (LASIX) 40 MG tablet TAKE 1 TABLET TWICE A DAY  . hydrALAZINE (APRESOLINE) 25 MG tablet TAKE 1 TABLET THREE TIMES A DAY  . Insulin Pen Needle (BD PEN NEEDLE MICRO U/F) 32G X 6 MM MISC Use as directed  . isosorbide  mononitrate (IMDUR) 30 MG 24 hr tablet Take 1 tablet (30 mg total) by mouth daily.  Marland Kitchen LANTUS SOLOSTAR 100 UNIT/ML Solostar Pen INJECT 20 UNITS AT NIGHT UNDER THE SKIN AS PER INSULIN PROTOCOL  . loratadine (CLARITIN) 10 MG tablet Take 10 mg by mouth every morning.  . Magnesium 250 MG TABS Take by mouth at bedtime.  . metolazone (ZAROXOLYN) 2.5 MG tablet TAKE 1 TABLET EVERY OTHER DAY 30 MINUTES PRIOR TO YOUR MORNING LASIX DOSE  . moxifloxacin (VIGAMOX) 0.5 % ophthalmic solution INT 1 GTT IN OS QID FOR 14 DAYS  . telmisartan (MICARDIS) 80 MG tablet TAKE 1 TABLET DAILY  . [DISCONTINUED] clonazePAM (KLONOPIN) 0.5 MG tablet Take 0.5 mg by mouth at bedtime.   No facility-administered encounter medications on file as of 06/23/2019.      Goals Addressed            This Visit's Progress     Patient Stated   . COMPLETED: "I need to get my eye drops from the pharmacy" (pt-stated)       Current Barriers:  . Financial constraints related to cost of medication  Clinical Social Work Clinical Goal(s):  Marland Kitchen Over the next 2 days the patient will obtain eye drops from her local pharmacy  SW Interventions: Completed 06/23/2019 . Received communication from both RN Case Manager and PharmD indicating the patient could access a prescription coupon from [employer] which would cut her total out  of pocket expense to half what she is currently quoted . Outbound call to the patient to explain this as an option . Informed by the patient her friend and neighbor plans to pickup the medication today and help cover the costs therefore she is not interested in intervention at this time . Encouraged the patient to contact SW in the event she changes her mind and would like assistance accessing coupon from [employer]  Patient Self Care Activities:  . Attends all scheduled provider appointments . Calls pharmacy for medication refills . Calls provider office for new concerns or questions  Please see past updates related to  this goal by clicking on the "Past Updates" button in the selected goal          Follow Up Plan: SW will follow up with patient by phone over the next week as previously scheduled.   Daneen Schick, BSW, CDP Social Worker, Certified Dementia Practitioner [place] [facility] [place] [facility] [phone]  Total time spent performing care coordination and/or care management activities with the patient by phone or face to face = 8 minutes.

## 2019-06-23 NOTE — Chronic Care Management (AMB) (Signed)
Chronic Care Management   Social Work Follow Up Note  06/23/2019 Name: Maria [name] MRN: [id number] DOB: Sep 04, 1926  Maria [name] is a 84 y.o. year old female who is a primary care patient of Glendale Chard, MD. The CCM team was consulted for assistance with care coordination.  Review of patient status, including review of consultants reports, other relevant assessments, and collaboration with appropriate care team members and the patient's provider was performed as part of comprehensive patient evaluation and provision of chronic care management services.     Outpatient Encounter Medications as of 06/23/2019  Medication Sig  . Accu-Chek FastClix Lancets MISC CHECK BLOOD SUGAR BEFORE BREAKFAST AND DINNER  . ACCU-CHEK SMARTVIEW test strip U TO CHECK BID BEFORE BRE AND DINNER dx: e11.65  . allopurinol (ZYLOPRIM) 300 MG tablet Take 1 tablet (300 mg total) by mouth daily.  Marland Kitchen amLODipine (NORVASC) 5 MG tablet TAKE ONE-HALF (1/2) TABLET DAILY  . aspirin 81 MG tablet Take 1 tablet (81 mg total) by mouth daily.  Marland Kitchen atorvastatin (LIPITOR) 40 MG tablet TAKE 1 TABLET DAILY  . Cholecalciferol (VITAMIN D) 2000 UNITS tablet Take 2,000 Units by mouth daily.  . citalopram (CELEXA) 20 MG tablet Take 20 mg by mouth daily.  . colchicine 0.6 MG tablet Take 1 tablet (0.6 mg total) by mouth daily.  Marland Kitchen docusate sodium (COLACE) 100 MG capsule Take 100 mg by mouth daily.  Marland Kitchen donepezil (ARICEPT) 10 MG tablet TAKE 1 TABLET DAILY IN THE EVENING  . ezetimibe (ZETIA) 10 MG tablet TAKE 1 TABLET AT BEDTIME  . FOLBIC 2.5-25-2 MG TABS tablet TAKE 1 TABLET DAILY  . furosemide (LASIX) 40 MG tablet TAKE 1 TABLET TWICE A DAY  . hydrALAZINE (APRESOLINE) 25 MG tablet TAKE 1 TABLET THREE TIMES A DAY  . Insulin Pen Needle (BD PEN NEEDLE MICRO U/F) 32G X 6 MM MISC Use as directed  . isosorbide mononitrate (IMDUR) 30 MG 24 hr tablet Take 1 tablet (30 mg total) by mouth daily.  Marland Kitchen LANTUS SOLOSTAR 100 UNIT/ML Solostar Pen INJECT  20 UNITS AT NIGHT UNDER THE SKIN AS PER INSULIN PROTOCOL  . loratadine (CLARITIN) 10 MG tablet Take 10 mg by mouth every morning.  . Magnesium 250 MG TABS Take by mouth at bedtime.  . metolazone (ZAROXOLYN) 2.5 MG tablet TAKE 1 TABLET EVERY OTHER DAY 30 MINUTES PRIOR TO YOUR MORNING LASIX DOSE  . moxifloxacin (VIGAMOX) 0.5 % ophthalmic solution INT 1 GTT IN OS QID FOR 14 DAYS  . telmisartan (MICARDIS) 80 MG tablet TAKE 1 TABLET DAILY  . [DISCONTINUED] clonazePAM (KLONOPIN) 0.5 MG tablet Take 0.5 mg by mouth at bedtime.   No facility-administered encounter medications on file as of 06/23/2019.      Goals Addressed            This Visit's Progress     Patient Stated   . "I need to get my eye drops from the pharmacy" (pt-stated)       Current Barriers:  . Financial constraints related to cost of medication  Clinical Social Work Clinical Goal(s):  Marland Kitchen Over the next 2 days the patient will obtain eye drops from her local pharmacy  SW Interventions: Completed 06/23/2019 . Patient interviewed and appropriate assessments performed . Determined the patient underwent an eye surgery "yesterday" to her left eye. The patient reports having the same procedure done to her right eye in June . Assessed for patient care of left eye since returning home. Advised by the patient she  is to use drops QID and sleep with a mask over her eye . Assessed for medication affordability concerns. The patient does identify financial limitations reporting she is currently using the leftover drops from her procedure to the right eye "the pharmacy said it will cost $107 and that will take all I have the rest of the month" . Discussed involving embedded PharmD to look into alternative options for cost savings. The patient reports she has someone coming today to pick up money to obtain prescriptions . Advised the patient SW would communicate to pharmacist in the event she could assist the patient  . Collaboration with RN  Case Manager to advise of patients recent procedure to left eye . Collaboration with embedded PharmD to request assistance with acute medication needs  Patient Self Care Activities:  . Attends all scheduled provider appointments . Calls pharmacy for medication refills . Calls provider office for new concerns or questions  Initial goal documentation       Other   . Assist with care coordination by conducting social determinants of health screen       Current Barriers:  Marland Kitchen Knowledge Barriers related to resources and support available to address needs related to Chronic Care Management and challenges surrounding Social Determinants of Health  Clinical Social Work Clinical Goal(s):   Over the next 20 days, the patient will understand the role of the CCM team and work with SW to complete SDOH (Social Determinants of Health) screen.  Interventions:  Outbound call placed to the patient to conduct SDOH screen  Determined the patient no longer drives self but does have access to SCAT services  Unable to complete assessment due to patients PT arriving in the home  Scheduled follow up call to the patient over the next 3 days  Patient Self Care Activities:   Calls provider office with new concerns  Performs ADL's independently  Initial goal documentation:         Follow Up Plan: SW will follow up with patient by phone over the next 3 days   Daneen Schick, BSW, CDP Social Worker, Certified Dementia Practitioner [place] [facility] [place] [facility] [phone]  Total time spent performing care coordination and/or care management activities with the patient by phone or face to face = 18 minutes.

## 2019-06-24 NOTE — Progress Notes (Signed)
This encounter was created in error - please disregard.

## 2019-06-25 ENCOUNTER — Telehealth: Payer: Self-pay

## 2019-06-25 DIAGNOSIS — M1990 Unspecified osteoarthritis, unspecified site: Secondary | ICD-10-CM | POA: Diagnosis not present

## 2019-06-25 DIAGNOSIS — K5730 Diverticulosis of large intestine without perforation or abscess without bleeding: Secondary | ICD-10-CM | POA: Diagnosis not present

## 2019-06-25 DIAGNOSIS — E1122 Type 2 diabetes mellitus with diabetic chronic kidney disease: Secondary | ICD-10-CM | POA: Diagnosis not present

## 2019-06-25 DIAGNOSIS — E785 Hyperlipidemia, unspecified: Secondary | ICD-10-CM | POA: Diagnosis not present

## 2019-06-25 DIAGNOSIS — Z9181 History of falling: Secondary | ICD-10-CM | POA: Diagnosis not present

## 2019-06-25 DIAGNOSIS — M109 Gout, unspecified: Secondary | ICD-10-CM | POA: Diagnosis not present

## 2019-06-25 DIAGNOSIS — N183 Chronic kidney disease, stage 3 unspecified: Secondary | ICD-10-CM | POA: Diagnosis not present

## 2019-06-25 DIAGNOSIS — E559 Vitamin D deficiency, unspecified: Secondary | ICD-10-CM | POA: Diagnosis not present

## 2019-06-25 DIAGNOSIS — Z794 Long term (current) use of insulin: Secondary | ICD-10-CM | POA: Diagnosis not present

## 2019-06-25 DIAGNOSIS — I5032 Chronic diastolic (congestive) heart failure: Secondary | ICD-10-CM | POA: Diagnosis not present

## 2019-06-25 DIAGNOSIS — I130 Hypertensive heart and chronic kidney disease with heart failure and stage 1 through stage 4 chronic kidney disease, or unspecified chronic kidney disease: Secondary | ICD-10-CM | POA: Diagnosis not present

## 2019-06-25 DIAGNOSIS — Z8673 Personal history of transient ischemic attack (TIA), and cerebral infarction without residual deficits: Secondary | ICD-10-CM | POA: Diagnosis not present

## 2019-06-25 DIAGNOSIS — E782 Mixed hyperlipidemia: Secondary | ICD-10-CM | POA: Diagnosis not present

## 2019-06-25 DIAGNOSIS — G4733 Obstructive sleep apnea (adult) (pediatric): Secondary | ICD-10-CM | POA: Diagnosis not present

## 2019-06-25 DIAGNOSIS — Z7982 Long term (current) use of aspirin: Secondary | ICD-10-CM | POA: Diagnosis not present

## 2019-06-26 ENCOUNTER — Telehealth: Payer: Self-pay

## 2019-06-26 NOTE — Progress Notes (Signed)
Office Visit Note  Patient: Maria [name]             Date of Birth: 22-Mar-1926           MRN: [id number]             PCP: Glendale Chard, MD Referring: Glendale Chard, MD Visit Date: 07/10/2019 Occupation: @GUAROCC @  Subjective:  Left leg pain   History of Present Illness: Maria [name] is a 85 y.o. female with history of gout and osteoarthritis.  Patient is taking allopurinol 300 mg 1 tablet by mouth daily and colchicine 0.6 mg 1 tablet by mouth daily.  She denies any recent gout flares.  She states that she is having severe left lower extremity pain.  She is experiencing left calf tenderness.  She has been working with a physical therapist at home and is concerned by the level of discomfort she experiences.  She continues to walk with a walker and has pain in bilateral knee joints.  She is having difficulties with ADLs due to discomfort she experiences.  She continues have discomfort in both hands and difficulty opening jars.  She states that her hand pain has been improving with physical therapy exercises.     Activities of Daily Living:  Patient reports morning stiffness for 2  minutes.   Patient Denies nocturnal pain.  Difficulty dressing/grooming: Denies Difficulty climbing stairs: Reports Difficulty getting out of chair: Reports Difficulty using hands for taps, buttons, cutlery, and/or writing: Reports  Review of Systems  Constitutional: Positive for fatigue.  HENT: Negative for mouth sores, mouth dryness and nose dryness.   Eyes: Negative for pain, visual disturbance and dryness.  Respiratory: Negative for cough, hemoptysis, shortness of breath and difficulty breathing.   Cardiovascular: Negative for chest pain, palpitations, hypertension and swelling in legs/feet.  Gastrointestinal: Negative for blood in stool, constipation and diarrhea.  Endocrine: Negative for increased urination.  Genitourinary: Negative for painful urination.  Musculoskeletal: Positive for  arthralgias, joint pain, muscle weakness and morning stiffness. Negative for joint swelling, myalgias, muscle tenderness and myalgias.  Skin: Negative for color change, pallor, rash, hair loss, nodules/bumps, skin tightness, ulcers and sensitivity to sunlight.  Allergic/Immunologic: Negative for susceptible to infections.  Neurological: Negative for dizziness, numbness, headaches and weakness.  Hematological: Negative for swollen glands.  Psychiatric/Behavioral: Negative for depressed mood and sleep disturbance. The patient is not nervous/anxious.     PMFS History:  Patient Active Problem List   Diagnosis Date Noted  . Gout 07/02/2019  . Insulin dependent type 2 diabetes mellitus ([place]) 07/02/2019  . Acute on chronic diastolic CHF (congestive heart failure) ([place]) 03/26/2018  . Acute on chronic renal insufficiency 03/26/2018  . Dyspnea on exertion 02/14/2018  . Palpitations 02/13/2017  . Chest pain 03/25/2015  . Acute diverticulitis 03/04/2013  . Bilateral lower extremity edema 01/23/2013  . Hyperlipidemia 01/23/2013  . Diverticulitis 01/31/2012  . Diabetes mellitus ([place]) 01/31/2012  . Hypertensive cardiovascular disease 01/31/2012  . Obstructive sleep apnea on CPAP 01/31/2012  . History of TIAs 01/31/2012  . Obesity 01/31/2012  . H/O vertigo 01/31/2012    Past Medical History:  Diagnosis Date  . Anxiety   . Arthritis   . Blood transfusion   . Depression   . Diabetes mellitus   . Edema   . Heart murmur   . Hyperlipidemia   . Hypertension   . Sleep apnea    cpap  . Stroke ([place])    3 x tias  . TIA (transient ischemic  attack)     Family History  Problem Relation Age of Onset  . Prostate cancer Other   . Healthy Mother   . Prostate cancer Father    Past Surgical History:  Procedure Laterality Date  . ABDOMINAL HYSTERECTOMY    . APPENDECTOMY    . NM MYOVIEW LTD  [id number]   post stress left ventricle is normal in size, ejection fraction is 65%, normal myocardial  perfusion study, low risk scan  . PARTIAL HYSTERECTOMY    . TRANSTHORACIC ECHOCARDIOGRAM  [id number]   Social History   Social History Narrative   Has SCAT   Immunization History  Administered Date(s) Administered  . Influenza, High Dose Seasonal PF 06/25/2018, 06/03/2019  . Pneumococcal Conjugate-13 01/26/2015     Objective: Vital Signs: BP (!) 105/56 (BP Location: Left Arm, Patient Position: Sitting, Cuff Size: Normal)   Pulse 78   Resp 18   Ht 4\' 11"  (1.499 m)   BMI 32.03 kg/m    Physical Exam Vitals signs and nursing note reviewed.  Constitutional:      Appearance: She is well-developed.  HENT:     Head: Normocephalic and atraumatic.  Eyes:     Conjunctiva/sclera: Conjunctivae normal.  Neck:     Musculoskeletal: Normal range of motion.  Cardiovascular:     Rate and Rhythm: Normal rate and regular rhythm.     Heart sounds: Normal heart sounds.  Pulmonary:     Effort: Pulmonary effort is normal.     Breath sounds: Normal breath sounds.  Abdominal:     General: Bowel sounds are normal.     Palpations: Abdomen is soft.  Lymphadenopathy:     Cervical: No cervical adenopathy.  Skin:    General: Skin is warm and dry.     Capillary Refill: Capillary refill takes less than 2 seconds.  Neurological:     Mental Status: She is alert and oriented to person, place, and time.  Psychiatric:        Behavior: Behavior normal.      Musculoskeletal Exam: C-spine limited range of motion.  Thoracic kyphosis noted.  Difficult to assess lumbar range of motion.  Shoulder joints have good range of motion with no discomfort.  Right elbow joint contracture.  She has incomplete fist formation bilaterally.  Synovial thickening of MCP, PIP, DIP joints but no synovitis noted.  Hip joints difficult to assess in seated position.  Knee joints have good range of motion with no warmth or effusion.  She has severe pain in the left lower extremity when extending the left knee.  She has bilateral knee  crepitus.  She has tenderness of the left calf.  She has pedal edema bilaterally.  CDAI Exam: CDAI Score: - Patient Global: -; Provider Global: - Swollen: -; Tender: - Joint Exam   No joint exam has been documented for this visit   There is currently no information documented on the homunculus. Go to the Rheumatology activity and complete the homunculus joint exam.  Investigation: No additional findings.  Imaging: No results found.  Recent Labs: Lab Results  Component Value Date   WBC 6.1 06/11/2019   HGB 9.2 (L) 06/11/2019   PLT 218 06/11/2019   NA 139 06/11/2019   K 3.9 06/11/2019   CL 101 06/11/2019   CO2 28 06/11/2019   GLUCOSE 172 (H) 06/11/2019   BUN 33 (H) 06/11/2019   CREATININE 1.56 (H) 06/11/2019   BILITOT 0.6 06/11/2019   ALKPHOS 67 03/03/2019   AST 33 06/11/2019  ALT 29 06/11/2019   PROT 5.9 (L) 06/11/2019   ALBUMIN 3.5 03/03/2019   CALCIUM 9.4 06/11/2019   GFRAA 33 (L) 06/11/2019    Speciality Comments: No specialty comments available.  Procedures:  No procedures performed Allergies: Ace inhibitors and Valsartan   Assessment / Plan:     Visit Diagnoses: Idiopathic chronic gout of multiple sites without tophus -She has not had any recent gout flares.  She is clinically doing well on allopurinol 300 mg 1 tablet by mouth daily and colchicine 0.6 mg 1 tablet by mouth daily.  She has not missed any doses recently.  Her uric acid level on 06/11/2019 was 5.5 which is within a desirable range.  She will continue taking allopurinol as prescribed.  She can start taking colchicine on an as-needed basis.  She was advised to avoid purine rich foods and alcohol.  We will continue to monitor her lab work on a regular basis.  She was advised to notify us if she develops signs or symptoms of a gout flare.  She will follow up in 3 months.  Plan: allopurinol (ZYLOPRIM) 300 MG tablet  Medication monitoring encounter - Allopurinol 300 mg and colchicine 0.6 mg 1 tablet by  mouth as needed.   Trigger finger, left index finger: She has intermittent discomfort and triggering.   Primary osteoarthritis of both hands: She has PIP and DIP synovial thickening consistent with osteoarthritis of both hands.  She has bilateral CMC joint synovial thickening.  No tenderness or synovitis noted on exam.  She has been working with a therapist at home and has noticed decreased discomfort.  We discussed the importance of joint protection and muscle strengthening.  Primary osteoarthritis of both knees: She has chronic pain in bilateral knee joints.  She has good range of motion of both knee joints.  Has bilateral knee crepitus.  No warmth or effusion was noted.  She is been having severe pain in the left knee joint and left lower extremity.  She has calf tenderness on exam today.  X-rays of the left tibia were obtained and she was scheduled for a Doppler ultrasound to assess for DVT.  Primary osteoarthritis of both feet: She has no discomfort in her feet at this time.  She wears proper fitting shoes.  Tenderness of left calf: She has been experiencing severe left lower extremity pain.  She has tenderness in the left calf on exam.  X-rays of the left tibia and fibula were obtained today.  She will be scheduled for a venous Doppler ultrasound to assess for DVT.   Pain in left tibia -She is having severe left lower extremity pain.  She has tenderness on exam.  She has pedal edema bilaterally.  No joint effusion noted.  X-rays of the left tibia and fibula were obtained today.  Plan: XR Tibia/Fibula Left  Other medical conditions are listed as follows:   Hypertensive heart disease with chronic diastolic congestive heart failure ([place])  Acute on chronic renal insufficiency  History of hyperlipidemia  History of diabetes mellitus, type II  History of diverticulitis  History of TIAs  Obstructive sleep apnea on CPAP     Orders: Orders Placed This Encounter  Procedures  . XR  Tibia/Fibula Left  . VAS [name] LOWER EXTREMITY VENOUS (DVT)   Meds ordered this encounter  Medications  . allopurinol (ZYLOPRIM) 300 MG tablet    Sig: Take 1 tablet (300 mg total) by mouth daily.    Dispense:  90 tablet  Refill:  0    Face-to-face time spent with patient was 30 minutes. Greater than 50% of time was spent in counseling and coordination of care.  Follow-Up Instructions: Return in about 3 months (around 10/10/2019) for Gout, Osteoarthritis.   Ofilia Neas, PA-C  Note - This record has been created using Dragon software.  Chart creation errors have been sought, but may not always  have been located. Such creation errors do not reflect on  the standard of medical care.

## 2019-06-29 ENCOUNTER — Ambulatory Visit (INDEPENDENT_AMBULATORY_CARE_PROVIDER_SITE_OTHER): Payer: Medicare Other

## 2019-06-29 DIAGNOSIS — Z9181 History of falling: Secondary | ICD-10-CM | POA: Diagnosis not present

## 2019-06-29 DIAGNOSIS — E1122 Type 2 diabetes mellitus with diabetic chronic kidney disease: Secondary | ICD-10-CM

## 2019-06-29 DIAGNOSIS — E559 Vitamin D deficiency, unspecified: Secondary | ICD-10-CM | POA: Diagnosis not present

## 2019-06-29 DIAGNOSIS — I5032 Chronic diastolic (congestive) heart failure: Secondary | ICD-10-CM | POA: Diagnosis not present

## 2019-06-29 DIAGNOSIS — Z794 Long term (current) use of insulin: Secondary | ICD-10-CM | POA: Diagnosis not present

## 2019-06-29 DIAGNOSIS — G4733 Obstructive sleep apnea (adult) (pediatric): Secondary | ICD-10-CM | POA: Diagnosis not present

## 2019-06-29 DIAGNOSIS — E785 Hyperlipidemia, unspecified: Secondary | ICD-10-CM | POA: Diagnosis not present

## 2019-06-29 DIAGNOSIS — M1990 Unspecified osteoarthritis, unspecified site: Secondary | ICD-10-CM | POA: Diagnosis not present

## 2019-06-29 DIAGNOSIS — I129 Hypertensive chronic kidney disease with stage 1 through stage 4 chronic kidney disease, or unspecified chronic kidney disease: Secondary | ICD-10-CM

## 2019-06-29 DIAGNOSIS — N183 Chronic kidney disease, stage 3 unspecified: Secondary | ICD-10-CM | POA: Diagnosis not present

## 2019-06-29 DIAGNOSIS — I5033 Acute on chronic diastolic (congestive) heart failure: Secondary | ICD-10-CM

## 2019-06-29 DIAGNOSIS — I130 Hypertensive heart and chronic kidney disease with heart failure and stage 1 through stage 4 chronic kidney disease, or unspecified chronic kidney disease: Secondary | ICD-10-CM | POA: Diagnosis not present

## 2019-06-29 DIAGNOSIS — M109 Gout, unspecified: Secondary | ICD-10-CM | POA: Diagnosis not present

## 2019-06-29 DIAGNOSIS — E782 Mixed hyperlipidemia: Secondary | ICD-10-CM | POA: Diagnosis not present

## 2019-06-29 DIAGNOSIS — K5730 Diverticulosis of large intestine without perforation or abscess without bleeding: Secondary | ICD-10-CM | POA: Diagnosis not present

## 2019-06-29 DIAGNOSIS — Z7982 Long term (current) use of aspirin: Secondary | ICD-10-CM | POA: Diagnosis not present

## 2019-06-29 DIAGNOSIS — Z8673 Personal history of transient ischemic attack (TIA), and cerebral infarction without residual deficits: Secondary | ICD-10-CM | POA: Diagnosis not present

## 2019-06-29 NOTE — Patient Instructions (Signed)
Social Worker Visit Information  Goals we discussed today:  Goals Addressed            This Visit's Progress     Patient Stated   . "I don't know what food I can eat" (pt-stated)       Current Barriers:  Marland Kitchen Multiple conditions impacted by diet choices including DM II, CKD III, HTN, and Gout . Limited knowledge of specific foods okay to eat and/or avoid in relation to comorbid conditions   Social Work Clinical Goal(s):  Marland Kitchen Over the next 45 days the patient will work with CCM RN Case Manager to become more knowledgeable of diet restrictions   CCM SW Interventions: . Patient interviewed and appropriate assessments performed . Determined the patient is unsure what food she can/can not eat in relation to chronic conditions - "can I eat beef? I know I can't eat liver" . Advised the patient SW would request RN Case Manager review diet restrictions during the next planned outreach call . Collaboration with RN Case Manager to communicate patient goal to become more knowledgeable of diet   Patient Self Care Activities:  . Attends all scheduled provider appointments . Performs ADL's independently . Calls provider office for new concerns or questions . Unable to verbalize appropriate diet restrictions in relation to health conditions  Initial goal documentation     . "I have Congestive Heart Failue" (pt-stated)       Current Barriers:  Marland Kitchen Knowledge Deficits related to Self health management for CHF  Nurse Case Manager Clinical Goal(s):  Marland Kitchen Over the next 30 days, patient will work with the CCM RN to address needs related to CHF . New 10/192020: Over the next 30 days the patient will work with CCM RN to understand the importance of daily weights  CCM SW Interventions: Completed 06/29/2019 . Patient interviewed and appropriate assessments performed . Determined the patient weighed herself on Thursday October 15th with a recorded weight of 156 pounds. Today the patient reports weighing in  at 153 pounds . Assessed for protocol for patient to follow regarding weight gain/loss - the patient denies knowledge of specific protocol surrounding weight fluctuations . Assessed for patients daily weight reading between Thursday to today (Monday 10/19) - Patient reports not weighing daily but stated "I am going to start checking every day" . Encouraged the patient to weigh daily and record readings in her calendar . Collaboration with patients primary provider Dr Baird Cancer as well as CCM RN Case Manager regarding patient reported weight loss  Patient Self Care Activities:  . Self administers medications as prescribed . Attends all scheduled provider appointments . Calls pharmacy for medication refills . Attends church or other social activities . Performs ADL's independently . Performs IADL's independently . Calls provider office for new concerns or questions  Please see past updates related to this goal by clicking on the "Past Updates" button in the selected goal        Other   . Assist with care coordination by conducting social determinants of health screen   On track    Current Barriers:  Marland Kitchen Knowledge Barriers related to resources and support available to address needs related to Chronic Care Management and challenges surrounding Social Determinants of Health  Clinical Social Work Clinical Goal(s):   Over the next 20 days, the patient will understand the role of the CCM team and work with SW to complete SDOH (Social Determinants of Health) screen. Completed  New 06/29/2019 Over the next 45 days  the patient will work with SW to review SDOH and follow up with identified resource needs  CCM SW Interventions: Completed 06/29/2019  Outbound call placed to the patient to ccomplete SDOH screen  Determined the patient has SCAT but rarely utilizes due to relying on friends to run errands on behalf of the patient  The patient denties financial struggles or home repair needs at this  time  The patient previously attended a congregational meal site but has since stopped due to [place] - patient reports meals were delivered until she stopped them due to freezer space  SW educated the patient on meals on wheels program through [employer] - the patient declines enrollment at this time and understands there is a wait list "My freezer is full"  Advised the patient SW would outreach her over the next month to review SDOH with the patient and options for meals on wheels prior to making quarterly outreach calls  Patient Self Care Activities:   Calls provider office with new concerns  Performs ADL's independently  See past updates        Materials Provided: Verbal education about community resources provided by phone  Follow Up Plan: SW will follow up with patient by phone over the next month   Daneen Schick, BSW, CDP Social Worker, Certified Dementia Practitioner [place] [facility] [place] [facility] [phone]

## 2019-06-29 NOTE — Chronic Care Management (AMB) (Signed)
Care Management    Clinical Social Work Follow Up Note  06/29/2019 Name: Maria [name] MRN: [id number] DOB: 1926-04-29  Maria [name] is a 85 y.o. year old female who is a primary care patient of Glendale Chard, MD. The CCM team was consulted for assistance with care coordination.  Review of patient status, including review of consultants reports, other relevant assessments, and collaboration with appropriate care team members and the patient's provider was performed as part of comprehensive patient evaluation and provision of chronic care management services.    SW placed a follow up call to the patient to assess progression of patient goal and complete SDOH screen. See care plan below.  Outpatient Encounter Medications as of 06/29/2019  Medication Sig  . Accu-Chek FastClix Lancets MISC CHECK BLOOD SUGAR BEFORE BREAKFAST AND DINNER  . ACCU-CHEK SMARTVIEW test strip U TO CHECK BID BEFORE BRE AND DINNER dx: e11.65  . allopurinol (ZYLOPRIM) 300 MG tablet Take 1 tablet (300 mg total) by mouth daily.  Marland Kitchen amLODipine (NORVASC) 5 MG tablet TAKE ONE-HALF (1/2) TABLET DAILY  . aspirin 81 MG tablet Take 1 tablet (81 mg total) by mouth daily.  Marland Kitchen atorvastatin (LIPITOR) 40 MG tablet TAKE 1 TABLET DAILY  . Cholecalciferol (VITAMIN D) 2000 UNITS tablet Take 2,000 Units by mouth daily.  . citalopram (CELEXA) 20 MG tablet Take 20 mg by mouth daily.  . colchicine 0.6 MG tablet Take 1 tablet (0.6 mg total) by mouth daily.  Marland Kitchen docusate sodium (COLACE) 100 MG capsule Take 100 mg by mouth daily.  Marland Kitchen donepezil (ARICEPT) 10 MG tablet TAKE 1 TABLET DAILY IN THE EVENING  . ezetimibe (ZETIA) 10 MG tablet TAKE 1 TABLET AT BEDTIME  . FOLBIC 2.5-25-2 MG TABS tablet TAKE 1 TABLET DAILY  . furosemide (LASIX) 40 MG tablet TAKE 1 TABLET TWICE A DAY  . hydrALAZINE (APRESOLINE) 25 MG tablet TAKE 1 TABLET THREE TIMES A DAY  . Insulin Pen Needle (BD PEN NEEDLE MICRO U/F) 32G X 6 MM MISC Use as directed  . isosorbide  mononitrate (IMDUR) 30 MG 24 hr tablet Take 1 tablet (30 mg total) by mouth daily.  Marland Kitchen LANTUS SOLOSTAR 100 UNIT/ML Solostar Pen INJECT 20 UNITS AT NIGHT UNDER THE SKIN AS PER INSULIN PROTOCOL  . loratadine (CLARITIN) 10 MG tablet Take 10 mg by mouth every morning.  . Magnesium 250 MG TABS Take by mouth at bedtime.  . metolazone (ZAROXOLYN) 2.5 MG tablet TAKE 1 TABLET EVERY OTHER DAY 30 MINUTES PRIOR TO YOUR MORNING LASIX DOSE  . moxifloxacin (VIGAMOX) 0.5 % ophthalmic solution INT 1 GTT IN OS QID FOR 14 DAYS  . telmisartan (MICARDIS) 80 MG tablet TAKE 1 TABLET DAILY  . [DISCONTINUED] clonazePAM (KLONOPIN) 0.5 MG tablet Take 0.5 mg by mouth at bedtime.   No facility-administered encounter medications on file as of 06/29/2019.      Goals Addressed            This Visit's Progress     Patient Stated   . "I don't know what food I can eat" (pt-stated)       Current Barriers:  Marland Kitchen Multiple conditions impacted by diet choices including DM II, CKD III, HTN, and Gout . Limited knowledge of specific foods okay to eat and/or avoid in relation to comorbid conditions   Social Work Clinical Goal(s):  Marland Kitchen Over the next 45 days the patient will work with CCM RN Case Manager to become more knowledgeable of diet restrictions   CCM  SW Interventions: . Patient interviewed and appropriate assessments performed . Determined the patient is unsure what food she can/can not eat in relation to chronic conditions - "can I eat beef? I know I can't eat liver" . Advised the patient SW would request RN Case Manager review diet restrictions during the next planned outreach call . Collaboration with RN Case Manager to communicate patient goal to become more knowledgeable of diet   Patient Self Care Activities:  . Attends all scheduled provider appointments . Performs ADL's independently . Calls provider office for new concerns or questions . Unable to verbalize appropriate diet restrictions in relation to health  conditions  Initial goal documentation     . "I have Congestive Heart Failue" (pt-stated)       Current Barriers:  Marland Kitchen Knowledge Deficits related to Self health management for CHF  Nurse Case Manager Clinical Goal(s):  Marland Kitchen Over the next 30 days, patient will work with the CCM RN to address needs related to CHF . New 10/192020: Over the next 30 days the patient will work with CCM RN to understand the importance of daily weights  CCM SW Interventions: Completed 06/29/2019 . Patient interviewed and appropriate assessments performed . Determined the patient weighed herself on Thursday October 15th with a recorded weight of 156 pounds. Today the patient reports weighing in at 153 pounds . Assessed for protocol for patient to follow regarding weight gain/loss - the patient denies knowledge of specific protocol surrounding weight fluctuations . Assessed for patients daily weight reading between Thursday to today (Monday 10/19) - Patient reports not weighing daily but stated "I am going to start checking every day" . Encouraged the patient to weigh daily and record readings in her calendar . Collaboration with patients primary provider Dr Baird Cancer as well as CCM RN Case Manager regarding patient reported weight loss  Patient Self Care Activities:  . Self administers medications as prescribed . Attends all scheduled provider appointments . Calls pharmacy for medication refills . Attends church or other social activities . Performs ADL's independently . Performs IADL's independently . Calls provider office for new concerns or questions  Please see past updates related to this goal by clicking on the "Past Updates" button in the selected goal        Other   . Assist with care coordination by conducting social determinants of health screen   On track    Current Barriers:  Marland Kitchen Knowledge Barriers related to resources and support available to address needs related to Chronic Care Management and  challenges surrounding Social Determinants of Health  Clinical Social Work Clinical Goal(s):   Over the next 20 days, the patient will understand the role of the CCM team and work with SW to complete SDOH (Social Determinants of Health) screen. Completed  New 06/29/2019 Over the next 45 days the patient will work with SW to review SDOH and follow up with identified resource needs  CCM SW Interventions: Completed 06/29/2019  Outbound call placed to the patient to ccomplete SDOH screen  Determined the patient has SCAT but rarely utilizes due to relying on friends to run errands on behalf of the patient  The patient denties financial struggles or home repair needs at this time  The patient previously attended a congregational meal site but has since stopped due to [place] - patient reports meals were delivered until she stopped them due to freezer space  SW educated the patient on meals on wheels program through [employer] [facility] [place] - the  patient declines enrollment at this time and understands there is a wait list "My freezer is full"  Advised the patient SW would outreach her over the next month to review SDOH with the patient and options for meals on wheels prior to making quarterly outreach calls  Patient Self Care Activities:   Calls provider office with new concerns  Performs ADL's independently  See past updates        Follow Up Plan: SW will follow up with patient by phone over the next month.   Daneen Schick, BSW, CDP Social Worker, Certified Dementia Practitioner [place] [facility] [place] [facility] [phone]  Total time spent performing care coordination and/or care management activities with the patient by phone or face to face = 26 minutes.

## 2019-06-30 ENCOUNTER — Other Ambulatory Visit: Payer: Self-pay

## 2019-06-30 DIAGNOSIS — K5730 Diverticulosis of large intestine without perforation or abscess without bleeding: Secondary | ICD-10-CM | POA: Diagnosis not present

## 2019-06-30 DIAGNOSIS — E1122 Type 2 diabetes mellitus with diabetic chronic kidney disease: Secondary | ICD-10-CM | POA: Diagnosis not present

## 2019-06-30 DIAGNOSIS — I5032 Chronic diastolic (congestive) heart failure: Secondary | ICD-10-CM | POA: Diagnosis not present

## 2019-06-30 DIAGNOSIS — N183 Chronic kidney disease, stage 3 unspecified: Secondary | ICD-10-CM | POA: Diagnosis not present

## 2019-06-30 DIAGNOSIS — Z8673 Personal history of transient ischemic attack (TIA), and cerebral infarction without residual deficits: Secondary | ICD-10-CM | POA: Diagnosis not present

## 2019-06-30 DIAGNOSIS — M109 Gout, unspecified: Secondary | ICD-10-CM | POA: Diagnosis not present

## 2019-06-30 DIAGNOSIS — Z7982 Long term (current) use of aspirin: Secondary | ICD-10-CM | POA: Diagnosis not present

## 2019-06-30 DIAGNOSIS — E785 Hyperlipidemia, unspecified: Secondary | ICD-10-CM | POA: Diagnosis not present

## 2019-06-30 DIAGNOSIS — Z9181 History of falling: Secondary | ICD-10-CM | POA: Diagnosis not present

## 2019-06-30 DIAGNOSIS — Z79899 Other long term (current) drug therapy: Secondary | ICD-10-CM

## 2019-06-30 DIAGNOSIS — M1990 Unspecified osteoarthritis, unspecified site: Secondary | ICD-10-CM | POA: Diagnosis not present

## 2019-06-30 DIAGNOSIS — E782 Mixed hyperlipidemia: Secondary | ICD-10-CM | POA: Diagnosis not present

## 2019-06-30 DIAGNOSIS — E559 Vitamin D deficiency, unspecified: Secondary | ICD-10-CM | POA: Diagnosis not present

## 2019-06-30 DIAGNOSIS — Z794 Long term (current) use of insulin: Secondary | ICD-10-CM | POA: Diagnosis not present

## 2019-06-30 DIAGNOSIS — G4733 Obstructive sleep apnea (adult) (pediatric): Secondary | ICD-10-CM | POA: Diagnosis not present

## 2019-06-30 DIAGNOSIS — I130 Hypertensive heart and chronic kidney disease with heart failure and stage 1 through stage 4 chronic kidney disease, or unspecified chronic kidney disease: Secondary | ICD-10-CM | POA: Diagnosis not present

## 2019-06-30 MED ORDER — COLCHICINE 0.6 MG PO TABS: 0.6000 mg | ORAL_TABLET | Freq: Every day | ORAL | 0 refills | Status: DC

## 2019-07-01 ENCOUNTER — Other Ambulatory Visit: Payer: Self-pay

## 2019-07-02 ENCOUNTER — Other Ambulatory Visit: Payer: Self-pay

## 2019-07-02 ENCOUNTER — Ambulatory Visit: Payer: Medicare Other | Admitting: Cardiology

## 2019-07-02 ENCOUNTER — Encounter: Payer: Self-pay | Admitting: Cardiology

## 2019-07-02 ENCOUNTER — Other Ambulatory Visit: Payer: Self-pay | Admitting: Cardiology

## 2019-07-02 VITALS — BP 114/56 | HR 106 | Ht 59.0 in | Wt 158.6 lb

## 2019-07-02 DIAGNOSIS — Z794 Long term (current) use of insulin: Secondary | ICD-10-CM

## 2019-07-02 DIAGNOSIS — R079 Chest pain, unspecified: Secondary | ICD-10-CM

## 2019-07-02 DIAGNOSIS — I5032 Chronic diastolic (congestive) heart failure: Secondary | ICD-10-CM

## 2019-07-02 DIAGNOSIS — I110 Hypertensive heart disease with heart failure: Secondary | ICD-10-CM | POA: Diagnosis not present

## 2019-07-02 DIAGNOSIS — R002 Palpitations: Secondary | ICD-10-CM

## 2019-07-02 DIAGNOSIS — M109 Gout, unspecified: Secondary | ICD-10-CM | POA: Diagnosis not present

## 2019-07-02 DIAGNOSIS — G4733 Obstructive sleep apnea (adult) (pediatric): Secondary | ICD-10-CM

## 2019-07-02 DIAGNOSIS — E119 Type 2 diabetes mellitus without complications: Secondary | ICD-10-CM

## 2019-07-02 DIAGNOSIS — Z9989 Dependence on other enabling machines and devices: Secondary | ICD-10-CM

## 2019-07-02 MED ORDER — METOPROLOL SUCCINATE ER 25 MG PO TB24: 25.0000 mg | ORAL_TABLET | Freq: Every day | ORAL | 0 refills | Status: DC

## 2019-07-02 NOTE — Assessment & Plan Note (Signed)
LVH, DD, normal LVF on echo

## 2019-07-02 NOTE — Assessment & Plan Note (Signed)
On colchicine

## 2019-07-02 NOTE — Assessment & Plan Note (Addendum)
History of chest pain with normal coronaries Feb 2012

## 2019-07-02 NOTE — Progress Notes (Signed)
Cardiology Office Note:    Date:  07/02/2019   ID:  TALLY LAMARQUE, DOB [birth year], MRN [id number]  PCP:  Glendale Chard, MD  Cardiologist:  Quay Burow, MD  Electrophysiologist:  None   Referring MD: Glendale Chard, MD   palpitations  History of Present Illness:    Maria [name] is a 85 y.o. female with a hx of chronic LE edema, HCVD, diastolic CHF, sleep apnea on C-pap, and prior normal coronaries and normal LVF. Her last echo was Aug 2020 and showed hyperdynamic systolic function, with an ejection fraction of >65%. The cavity size was normal. severe basal septal hypertrophy. Left ventricular diastolic Doppler parameters are consistent with impaired relaxation.  She has recently had problems with palpitations.  Dr Gwenlyn Found ordered a ZIO but I don't have the results available to me yet. The patient describes brief episodes of tachycardia which she feels in her upper chest.     Past Medical History:  Diagnosis Date  . Anxiety   . Arthritis   . Blood transfusion   . Depression   . Diabetes mellitus   . Edema   . Heart murmur   . Hyperlipidemia   . Hypertension   . Sleep apnea    cpap  . Stroke ([place])    3 x tias  . TIA (transient ischemic attack)     Past Surgical History:  Procedure Laterality Date  . ABDOMINAL HYSTERECTOMY    . APPENDECTOMY    . NM MYOVIEW LTD  [id number]   post stress left ventricle is normal in size, ejection fraction is 65%, normal myocardial perfusion study, low risk scan  . PARTIAL HYSTERECTOMY    . TRANSTHORACIC ECHOCARDIOGRAM  [id number]    Current Medications: Current Meds  Medication Sig  . Accu-Chek FastClix Lancets MISC CHECK BLOOD SUGAR BEFORE BREAKFAST AND DINNER  . ACCU-CHEK SMARTVIEW test strip U TO CHECK BID BEFORE BRE AND DINNER dx: e11.65  . allopurinol (ZYLOPRIM) 300 MG tablet Take 1 tablet (300 mg total) by mouth daily.  Marland Kitchen aspirin 81 MG tablet Take 1 tablet (81 mg total) by mouth daily.  Marland Kitchen atorvastatin (LIPITOR) 40 MG tablet  TAKE 1 TABLET DAILY  . Cholecalciferol (VITAMIN D) 2000 UNITS tablet Take 2,000 Units by mouth daily.  . citalopram (CELEXA) 20 MG tablet Take 20 mg by mouth daily.  . colchicine 0.6 MG tablet Take 1 tablet (0.6 mg total) by mouth daily.  Marland Kitchen docusate sodium (COLACE) 100 MG capsule Take 100 mg by mouth daily.  Marland Kitchen donepezil (ARICEPT) 10 MG tablet TAKE 1 TABLET DAILY IN THE EVENING  . ezetimibe (ZETIA) 10 MG tablet TAKE 1 TABLET AT BEDTIME  . FOLBIC 2.5-25-2 MG TABS tablet TAKE 1 TABLET DAILY  . furosemide (LASIX) 40 MG tablet TAKE 1 TABLET TWICE A DAY  . hydrALAZINE (APRESOLINE) 25 MG tablet TAKE 1 TABLET THREE TIMES A DAY  . Insulin Pen Needle (BD PEN NEEDLE MICRO U/F) 32G X 6 MM MISC Use as directed  . isosorbide mononitrate (IMDUR) 30 MG 24 hr tablet Take 1 tablet (30 mg total) by mouth daily.  Marland Kitchen LANTUS SOLOSTAR 100 UNIT/ML Solostar Pen INJECT 20 UNITS AT NIGHT UNDER THE SKIN AS PER INSULIN PROTOCOL  . loratadine (CLARITIN) 10 MG tablet Take 10 mg by mouth every morning.  . Magnesium 250 MG TABS Take by mouth at bedtime.  . metolazone (ZAROXOLYN) 2.5 MG tablet TAKE 1 TABLET EVERY OTHER DAY 30 MINUTES PRIOR TO YOUR MORNING LASIX DOSE  . moxifloxacin (  VIGAMOX) 0.5 % ophthalmic solution INT 1 GTT IN OS QID FOR 14 DAYS  . telmisartan (MICARDIS) 80 MG tablet TAKE 1 TABLET DAILY  . [DISCONTINUED] amLODipine (NORVASC) 5 MG tablet TAKE ONE-HALF (1/2) TABLET DAILY     Allergies:   Ace inhibitors and Valsartan   Social History   Socioeconomic History  . Marital status: Widowed    Spouse name: Not on file  . Number of children: Not on file  . Years of education: Not on file  . Highest education level: Not on file  Occupational History  . Occupation: retired  Scientific laboratory technician  . Financial resource strain: Not hard at all  . Food insecurity    Worry: Never true    Inability: Never true  . Transportation needs    Medical: No    Non-medical: No  Tobacco Use  . Smoking status: Never Smoker  .  Smokeless tobacco: Never Used  Substance and Sexual Activity  . Alcohol use: No  . Drug use: No  . Sexual activity: Not Currently  Lifestyle  . Physical activity    Days per week: 0 days    Minutes per session: 0 min  . Stress: Not at all  Relationships  . Social Herbalist on phone: Not on file    Gets together: Not on file    Attends religious service: Not on file    Active member of club or organization: Not on file    Attends meetings of clubs or organizations: Not on file    Relationship status: Not on file  Other Topics Concern  . Not on file  Social History Narrative   Has SCAT     Family History: The patient's family history includes Healthy in her mother; Prostate cancer in her father and another family member.  ROS:   Please see the history of present illness.     All other systems reviewed and are negative.  EKGs/Labs/Other Studies Reviewed:    The following studies were reviewed today: Echo Aug 2020  EKG:  EKG is ordered today.  The ekg ordered today demonstrates NSR-HR 97, PACs, RBBB  Recent Labs: 06/03/2019: Magnesium 1.5; TSH 2.740 06/11/2019: ALT 29; BUN 33; Creat 1.56; Hemoglobin 9.2; Platelets 218; Potassium 3.9; Sodium 139  Recent Lipid Panel    Component Value Date/Time   CHOL 137 03/03/2019 1443   TRIG 69 03/03/2019 1443   HDL 63 03/03/2019 1443   CHOLHDL 2.2 03/03/2019 1443   LDLCALC 60 03/03/2019 1443    Physical Exam:    VS:  BP (!) 114/56   Pulse (!) 106   Ht 4\' 11"  (1.499 m)   Wt 158 lb 9.6 oz (71.9 kg)   SpO2 98%   BMI 32.03 kg/m     Wt Readings from Last 3 Encounters:  07/02/19 158 lb 9.6 oz (71.9 kg)  06/11/19 162 lb (73.5 kg)  06/04/19 160 lb (72.6 kg)     GEN:  Well nourished, well developed in no acute distress HEENT: Normal NECK: No JVD; No carotid bruits LYMPHATICS: No lymphadenopathy CARDIAC: RRR, 2/6 systolic murmur AOV and LSB, rubs, gallops RESPIRATORY:  Clear to auscultation without rales, wheezing  or rhonchi  ABDOMEN: Soft, non-tender, non-distended MUSCULOSKELETAL:  1+ edema; No deformity  SKIN: Warm and dry NEUROLOGIC:  Alert and oriented x 3 PSYCHIATRIC:  Normal affect   ASSESSMENT:    Palpitations ZIO results pending. I suggested adding Toprol 25 mg for her palpitations.  She was reluctant  to add a new medication but agreed after I offered to stop her Norvasc 2.5 mg.   Hypertensive cardiovascular disease LVH, DD, normal LVF on echo  Obstructive sleep apnea on CPAP She has not been on C-pap secondary to complications  Chest pain History of chest pain with normal coronaries Feb 2012  Gout On colchicine  Insulin dependent type 2 diabetes mellitus ([place]) Per PCP  PLAN:    Add Toprol 25 mg daily.  Stop Amlodipine 2.5 mg.  F/U one month with me.    Medication Adjustments/Labs and Tests Ordered: Current medicines are reviewed at length with the patient today.  Concerns regarding medicines are outlined above.  Orders Placed This Encounter  Procedures  . EKG 12-Lead   Meds ordered this encounter  Medications  . metoprolol succinate (TOPROL XL) 25 MG 24 hr tablet    Sig: Take 1 tablet (25 mg total) by mouth daily.    Dispense:  30 tablet    Refill:  0    Stop Norvasc this is taking the place    Patient Instructions  Medication Instructions:  STOP Norvasc (Amlodpine) START Toprol (Metoprolol Succinate) 25mg  Take 1 tablet once a day  *If you need a refill on your cardiac medications before your next appointment, please call your pharmacy*  Lab Work: None  If you have labs (blood work) drawn today and your tests are completely normal, you will receive your results only by: Marland Kitchen MyChart Message (if you have MyChart) OR . A paper copy in the mail If you have any lab test that is abnormal or we need to change your treatment, we will call you to review the results.  Testing/Procedures: None   Follow-Up: At [facility], you and your health needs are our  priority.  As part of our continuing mission to provide you with exceptional heart care, we have created designated Provider Care Teams.  These Care Teams include your primary Cardiologist (physician) and Advanced Practice Providers (APPs -  Physician Assistants and Nurse Practitioners) who all work together to provide you with the care you need, when you need it.  Your next appointment:   1 month  The format for your next appointment:   In Person  Provider:   Kerin Ransom, PA-C  Other Instructions     Signed, Kerin Ransom, PA-C  07/02/2019 11:01 AM    [place]

## 2019-07-02 NOTE — Assessment & Plan Note (Signed)
ZIO results pending. I suggested adding Toprol 25 mg for her palpitations.  She was reluctant to add a new medication but agreed after I offered to stop her Norvasc 2.5 mg.

## 2019-07-02 NOTE — Assessment & Plan Note (Signed)
Per PCP 

## 2019-07-02 NOTE — Patient Instructions (Addendum)
Medication Instructions:  STOP Norvasc (Amlodpine) START Toprol (Metoprolol Succinate) 25mg  Take 1 tablet once a day  *If you need a refill on your cardiac medications before your next appointment, please call your pharmacy*  Lab Work: None  If you have labs (blood work) drawn today and your tests are completely normal, you will receive your results only by: Marland Kitchen MyChart Message (if you have MyChart) OR . A paper copy in the mail If you have any lab test that is abnormal or we need to change your treatment, we will call you to review the results.  Testing/Procedures: None   Follow-Up: At [facility] you and your health needs are our priority.  As part of our continuing mission to provide you with exceptional heart care, we have created designated Provider Care Teams.  These Care Teams include your primary Cardiologist (physician) and Advanced Practice Providers (APPs -  Physician Assistants and Nurse Practitioners) who all work together to provide you with the care you need, when you need it.  Your next appointment:   1 month  The format for your next appointment:   In Person  Provider:   Kerin Ransom, PA-C  Other Instructions

## 2019-07-02 NOTE — Assessment & Plan Note (Addendum)
She has not been on C-pap secondary to complications

## 2019-07-03 ENCOUNTER — Telehealth: Payer: Self-pay

## 2019-07-03 ENCOUNTER — Ambulatory Visit: Payer: Self-pay

## 2019-07-03 DIAGNOSIS — K5730 Diverticulosis of large intestine without perforation or abscess without bleeding: Secondary | ICD-10-CM | POA: Diagnosis not present

## 2019-07-03 DIAGNOSIS — G4733 Obstructive sleep apnea (adult) (pediatric): Secondary | ICD-10-CM | POA: Diagnosis not present

## 2019-07-03 DIAGNOSIS — E782 Mixed hyperlipidemia: Secondary | ICD-10-CM | POA: Diagnosis not present

## 2019-07-03 DIAGNOSIS — I5033 Acute on chronic diastolic (congestive) heart failure: Secondary | ICD-10-CM

## 2019-07-03 DIAGNOSIS — E1122 Type 2 diabetes mellitus with diabetic chronic kidney disease: Secondary | ICD-10-CM | POA: Diagnosis not present

## 2019-07-03 DIAGNOSIS — N183 Chronic kidney disease, stage 3 unspecified: Secondary | ICD-10-CM | POA: Diagnosis not present

## 2019-07-03 DIAGNOSIS — M1990 Unspecified osteoarthritis, unspecified site: Secondary | ICD-10-CM | POA: Diagnosis not present

## 2019-07-03 DIAGNOSIS — Z9181 History of falling: Secondary | ICD-10-CM | POA: Diagnosis not present

## 2019-07-03 DIAGNOSIS — Z794 Long term (current) use of insulin: Secondary | ICD-10-CM

## 2019-07-03 DIAGNOSIS — I130 Hypertensive heart and chronic kidney disease with heart failure and stage 1 through stage 4 chronic kidney disease, or unspecified chronic kidney disease: Secondary | ICD-10-CM | POA: Diagnosis not present

## 2019-07-03 DIAGNOSIS — Z8673 Personal history of transient ischemic attack (TIA), and cerebral infarction without residual deficits: Secondary | ICD-10-CM | POA: Diagnosis not present

## 2019-07-03 DIAGNOSIS — I129 Hypertensive chronic kidney disease with stage 1 through stage 4 chronic kidney disease, or unspecified chronic kidney disease: Secondary | ICD-10-CM

## 2019-07-03 DIAGNOSIS — E559 Vitamin D deficiency, unspecified: Secondary | ICD-10-CM | POA: Diagnosis not present

## 2019-07-03 DIAGNOSIS — M109 Gout, unspecified: Secondary | ICD-10-CM | POA: Diagnosis not present

## 2019-07-03 DIAGNOSIS — I5032 Chronic diastolic (congestive) heart failure: Secondary | ICD-10-CM | POA: Diagnosis not present

## 2019-07-03 DIAGNOSIS — Z7982 Long term (current) use of aspirin: Secondary | ICD-10-CM | POA: Diagnosis not present

## 2019-07-03 DIAGNOSIS — E785 Hyperlipidemia, unspecified: Secondary | ICD-10-CM | POA: Diagnosis not present

## 2019-07-06 DIAGNOSIS — I5032 Chronic diastolic (congestive) heart failure: Secondary | ICD-10-CM | POA: Diagnosis not present

## 2019-07-06 DIAGNOSIS — E782 Mixed hyperlipidemia: Secondary | ICD-10-CM | POA: Diagnosis not present

## 2019-07-06 DIAGNOSIS — I130 Hypertensive heart and chronic kidney disease with heart failure and stage 1 through stage 4 chronic kidney disease, or unspecified chronic kidney disease: Secondary | ICD-10-CM | POA: Diagnosis not present

## 2019-07-06 DIAGNOSIS — G4733 Obstructive sleep apnea (adult) (pediatric): Secondary | ICD-10-CM | POA: Diagnosis not present

## 2019-07-06 DIAGNOSIS — E1122 Type 2 diabetes mellitus with diabetic chronic kidney disease: Secondary | ICD-10-CM | POA: Diagnosis not present

## 2019-07-06 DIAGNOSIS — Z8673 Personal history of transient ischemic attack (TIA), and cerebral infarction without residual deficits: Secondary | ICD-10-CM | POA: Diagnosis not present

## 2019-07-06 DIAGNOSIS — Z9181 History of falling: Secondary | ICD-10-CM | POA: Diagnosis not present

## 2019-07-06 DIAGNOSIS — K5730 Diverticulosis of large intestine without perforation or abscess without bleeding: Secondary | ICD-10-CM | POA: Diagnosis not present

## 2019-07-06 DIAGNOSIS — Z794 Long term (current) use of insulin: Secondary | ICD-10-CM | POA: Diagnosis not present

## 2019-07-06 DIAGNOSIS — E559 Vitamin D deficiency, unspecified: Secondary | ICD-10-CM | POA: Diagnosis not present

## 2019-07-06 DIAGNOSIS — M109 Gout, unspecified: Secondary | ICD-10-CM | POA: Diagnosis not present

## 2019-07-06 DIAGNOSIS — M1990 Unspecified osteoarthritis, unspecified site: Secondary | ICD-10-CM | POA: Diagnosis not present

## 2019-07-06 DIAGNOSIS — N183 Chronic kidney disease, stage 3 unspecified: Secondary | ICD-10-CM | POA: Diagnosis not present

## 2019-07-06 DIAGNOSIS — E785 Hyperlipidemia, unspecified: Secondary | ICD-10-CM | POA: Diagnosis not present

## 2019-07-06 DIAGNOSIS — Z7982 Long term (current) use of aspirin: Secondary | ICD-10-CM | POA: Diagnosis not present

## 2019-07-07 DIAGNOSIS — M1990 Unspecified osteoarthritis, unspecified site: Secondary | ICD-10-CM | POA: Diagnosis not present

## 2019-07-07 DIAGNOSIS — Z9181 History of falling: Secondary | ICD-10-CM | POA: Diagnosis not present

## 2019-07-07 DIAGNOSIS — I5032 Chronic diastolic (congestive) heart failure: Secondary | ICD-10-CM | POA: Diagnosis not present

## 2019-07-07 DIAGNOSIS — N183 Chronic kidney disease, stage 3 unspecified: Secondary | ICD-10-CM | POA: Diagnosis not present

## 2019-07-07 DIAGNOSIS — Z794 Long term (current) use of insulin: Secondary | ICD-10-CM | POA: Diagnosis not present

## 2019-07-07 DIAGNOSIS — E785 Hyperlipidemia, unspecified: Secondary | ICD-10-CM | POA: Diagnosis not present

## 2019-07-07 DIAGNOSIS — I130 Hypertensive heart and chronic kidney disease with heart failure and stage 1 through stage 4 chronic kidney disease, or unspecified chronic kidney disease: Secondary | ICD-10-CM | POA: Diagnosis not present

## 2019-07-07 DIAGNOSIS — G4733 Obstructive sleep apnea (adult) (pediatric): Secondary | ICD-10-CM | POA: Diagnosis not present

## 2019-07-07 DIAGNOSIS — Z8673 Personal history of transient ischemic attack (TIA), and cerebral infarction without residual deficits: Secondary | ICD-10-CM | POA: Diagnosis not present

## 2019-07-07 DIAGNOSIS — Z7982 Long term (current) use of aspirin: Secondary | ICD-10-CM | POA: Diagnosis not present

## 2019-07-07 DIAGNOSIS — E1122 Type 2 diabetes mellitus with diabetic chronic kidney disease: Secondary | ICD-10-CM | POA: Diagnosis not present

## 2019-07-07 DIAGNOSIS — E559 Vitamin D deficiency, unspecified: Secondary | ICD-10-CM | POA: Diagnosis not present

## 2019-07-07 DIAGNOSIS — K5730 Diverticulosis of large intestine without perforation or abscess without bleeding: Secondary | ICD-10-CM | POA: Diagnosis not present

## 2019-07-07 DIAGNOSIS — E782 Mixed hyperlipidemia: Secondary | ICD-10-CM | POA: Diagnosis not present

## 2019-07-07 DIAGNOSIS — M109 Gout, unspecified: Secondary | ICD-10-CM | POA: Diagnosis not present

## 2019-07-08 NOTE — Patient Instructions (Signed)
Visit Information  Goals Addressed      Patient Stated   . "I don't know what food I can eat" (pt-stated)       Current Barriers:  Marland Kitchen Multiple conditions impacted by diet choices including DM II, CKD III, HTN, and Gout . Limited knowledge of specific foods okay to eat and/or avoid in relation to comorbid conditions   Social Work Clinical Goal(s):  Marland Kitchen Over the next 45 days the patient will work with CCM RN Case Manager to become more knowledgeable of diet restrictions   CCM SW Interventions: . Patient interviewed and appropriate assessments performed . Determined the patient is unsure what food she can/can not eat in relation to chronic conditions - "can I eat beef? I know I can't eat liver" . Advised the patient SW would request RN Case Manager review diet restrictions during the next planned outreach call . Collaboration with RN Case Manager to communicate patient goal to become more knowledgeable of diet   CCM RN CM Interventions:  07/03/19 call completed with patient   . Evaluation of current treatment plan related to dietary recommendations for DM and CHF and patient's adherence to plan as established by provider. . Provided education to patient re: dietary recommendations appropriate for chronic conditions such as DM and CHF; instructed patient to eat plenty of fruits and vegetables, fresh foods including lean meats, fish, poultry, eggs (no more than 3 per week unless eating egg whites only), milk, yogurt, plain or brown rice, oatmeal, low sodium snacks and reiterated the importance of avoiding the salt shaker . Reviewed medications with patient and discussed patient is taking her Lantus exactly as directed  . Discussed plans with patient for ongoing care management follow up and provided patient with direct contact information for care management team . Provided patient with printed educational materials related to Meal Planning Using the Plate Method and Portion Control . Advised  patient, providing education and rationale, to check cbg daily before meals and record, calling the CCM team and or Dr. Baird Cancer for findings outside established parameters.  <80 or >250 . Discussed patient is checking FBS each am with ACCU-CHEK glucometer, today FBS was 91, yesterday it was 158, patient states her average is in the low 100's  Patient Self Care Activities:  . Attends all scheduled provider appointments . Performs ADL's independently . Calls provider office for new concerns or questions . Unable to verbalize appropriate diet restrictions in relation to health conditions  Please see past updates related to this goal by clicking on the "Past Updates" button in the selected goal      . "I have Congestive Heart Failue" (pt-stated)       Current Barriers:  Marland Kitchen Knowledge Deficits related to Self health management for CHF  Nurse Case Manager Clinical Goal(s):  Marland Kitchen Over the next 30 days, patient will work with the CCM RN to address needs related to CHF . New 10/192020: Over the next 30 days the patient will work with CCM RN to understand the importance of daily weights  CCM SW Interventions: Completed 06/29/2019 . Patient interviewed and appropriate assessments performed . Determined the patient weighed herself on Thursday October 15th with a recorded weight of 156 pounds. Today the patient reports weighing in at 153 pounds . Assessed for protocol for patient to follow regarding weight gain/loss - the patient denies knowledge of specific protocol surrounding weight fluctuations . Assessed for patients daily weight reading between Thursday to today (Monday 10/19) - Patient reports not  weighing daily but stated "I am going to start checking every day" . Encouraged the patient to weigh daily and record readings in her calendar . Collaboration with patients primary provider Dr Baird Cancer as well as CCM RN Case Manager regarding patient reported weight loss  CCM RN CM Interventions:   07/03/19 call completed with patient  . Evaluation of current treatment plan related to CHF and patient's adherence to plan as established by provider. . Advised patient to keep Cardiologist and or PCP well informed of new or worsening heart palpitation, chest pain and or shortness of breath; discussed patient f/u with Kerin Ransom PA-C on 07/02/19 with noted medication changes and recommendations to f/u with him again in 1 month . Reviewed medications with patient and discussed Kerin Ransom PA-C made the following medication changes; STOP Norvasc (Amlodipine); START Toprol (Metoprolol Succinate) 25 mg Take 1 tablet once a day, patient reminded if she needs a refill on her cardiac medications before her next appointment, she should contact her pharmacy to assist; patient verbalizes understanding and has made this medication change; discussed patient's BP today is 133/72 and O2 sat is 98% . Discussed plans with patient for ongoing care management follow up and provided patient with direct contact information for care management team . Advised patient, providing education and rationale, to weigh daily and record, calling the CCM team and MD Baird Cancer or Gwenlyn Found) for weight gain of 3lbs overnight or 5 pounds in a week.  . Discussed patient has not yet received her printed mailed patient education materials to review  Patient Self Care Activities:  . Self administers medications as prescribed . Attends all scheduled provider appointments . Calls pharmacy for medication refills . Attends church or other social activities . Performs ADL's independently . Performs IADL's independently . Calls provider office for new concerns or questions  Please see past updates related to this goal by clicking on the "Past Updates" button in the selected goal      . "I just feel weak" (pt-stated)       Current Barriers:  Marland Kitchen Knowledge Deficits related to acute onset of "weakness"   Nurse Case Manager Clinical Goal(s):   Marland Kitchen Over the next 14 days, patient will report the acute onset of weakness has resolved as evidence by patient will have increased energy and will be able to complete her daily activities w/o difficulty or fatigue. GOAL MET . Over the next 30 days, patient will not experience ED visits and or IP admission.   CCM RN CM Interventions:  07/03/19 call completed with the patient   . Evaluation of current treatment plan related to new onset of weakness and patient's adherence to plan as established by provider . Discussed patient feels stronger and she continues to work with in home PT - reports finding this therapy effective and her overall stamina has improved . Instructed patient to continue to stay well hydrated and to notify the CCM team and or Dr. Baird Cancer if the weakness reoccurs . Discussed plans with patient for ongoing care management follow up and provided patient with direct contact information for care management team  Patient Self Care Activities:  . Self administers medications as prescribed . Attends all scheduled provider appointments . Calls pharmacy for medication refills . Performs ADL's independently . Performs IADL's independently . Calls provider office for new concerns or questions   Please see past updates related to this goal by clicking on the "Past Updates" button in the selected goal  The patient verbalized understanding of instructions provided today and declined a print copy of patient instruction materials.   Telephone follow up appointment with care management team member scheduled for: 08/04/19  Barb Merino, RN, BSN, CCM Care Management Coordinator [facility]  Direct Phone: [phone]

## 2019-07-08 NOTE — Chronic Care Management (AMB) (Signed)
Chronic Care Management   Follow Up Note   07/03/2019 Name: Maria [name] MRN: [id number] DOB: 1926/04/18  Referred by: Glendale Chard, MD Reason for referral : Chronic Care Management (CCM RNCM Telephone Outreach)   Maria [name] is a 85 y.o. year old female who is a primary care patient of Glendale Chard, MD. The CCM team was consulted for assistance with chronic disease management and care coordination needs.    Review of patient status, including review of consultants reports, relevant laboratory and other test results, and collaboration with appropriate care team members and the patient's provider was performed as part of comprehensive patient evaluation and provision of chronic care management services.    SDOH (Social Determinants of Health) screening performed today: None. See Care Plan for related entries.   Advanced Directives Status: N See Care Plan and Vynca application for related entries.  I spoke with Maria [name] by telephone today to follow up on her CHF status.   Outpatient Encounter Medications as of 07/03/2019  Medication Sig  . Accu-Chek FastClix Lancets MISC CHECK BLOOD SUGAR BEFORE BREAKFAST AND DINNER  . ACCU-CHEK SMARTVIEW test strip U TO CHECK BID BEFORE BRE AND DINNER dx: e11.65  . allopurinol (ZYLOPRIM) 300 MG tablet Take 1 tablet (300 mg total) by mouth daily.  Marland Kitchen aspirin 81 MG tablet Take 1 tablet (81 mg total) by mouth daily.  Marland Kitchen atorvastatin (LIPITOR) 40 MG tablet TAKE 1 TABLET DAILY  . Cholecalciferol (VITAMIN D) 2000 UNITS tablet Take 2,000 Units by mouth daily.  . citalopram (CELEXA) 20 MG tablet Take 20 mg by mouth daily.  . colchicine 0.6 MG tablet Take 1 tablet (0.6 mg total) by mouth daily.  Marland Kitchen docusate sodium (COLACE) 100 MG capsule Take 100 mg by mouth daily.  Marland Kitchen donepezil (ARICEPT) 10 MG tablet TAKE 1 TABLET DAILY IN THE EVENING  . ezetimibe (ZETIA) 10 MG tablet TAKE 1 TABLET AT BEDTIME  . FOLBIC 2.5-25-2 MG TABS tablet TAKE 1 TABLET DAILY   . furosemide (LASIX) 40 MG tablet TAKE 1 TABLET TWICE A DAY  . hydrALAZINE (APRESOLINE) 25 MG tablet TAKE 1 TABLET THREE TIMES A DAY  . Insulin Pen Needle (BD PEN NEEDLE MICRO U/F) 32G X 6 MM MISC Use as directed  . isosorbide mononitrate (IMDUR) 30 MG 24 hr tablet Take 1 tablet (30 mg total) by mouth daily.  Marland Kitchen LANTUS SOLOSTAR 100 UNIT/ML Solostar Pen INJECT 20 UNITS AT NIGHT UNDER THE SKIN AS PER INSULIN PROTOCOL  . loratadine (CLARITIN) 10 MG tablet Take 10 mg by mouth every morning.  . Magnesium 250 MG TABS Take by mouth at bedtime.  . metolazone (ZAROXOLYN) 2.5 MG tablet TAKE 1 TABLET EVERY OTHER DAY 30 MINUTES PRIOR TO YOUR MORNING LASIX DOSE  . metoprolol succinate (TOPROL-XL) 25 MG 24 hr tablet TAKE 1 TABLET(25 MG) BY MOUTH DAILY  . moxifloxacin (VIGAMOX) 0.5 % ophthalmic solution INT 1 GTT IN OS QID FOR 14 DAYS  . telmisartan (MICARDIS) 80 MG tablet TAKE 1 TABLET DAILY  . [DISCONTINUED] clonazePAM (KLONOPIN) 0.5 MG tablet Take 0.5 mg by mouth at bedtime.   No facility-administered encounter medications on file as of 07/03/2019.      Goals Addressed      Patient Stated   . "I don't know what food I can eat" (pt-stated)       Current Barriers:  Marland Kitchen Multiple conditions impacted by diet choices including DM II, CKD III, HTN, and Gout . Limited knowledge of specific foods okay  to eat and/or avoid in relation to comorbid conditions   Social Work Clinical Goal(s):  Marland Kitchen Over the next 45 days the patient will work with CCM RN Case Manager to become more knowledgeable of diet restrictions   CCM SW Interventions: . Patient interviewed and appropriate assessments performed . Determined the patient is unsure what food she can/can not eat in relation to chronic conditions - "can I eat beef? I know I can't eat liver" . Advised the patient SW would request RN Case Manager review diet restrictions during the next planned outreach call . Collaboration with RN Case Manager to communicate patient  goal to become more knowledgeable of diet   CCM RN CM Interventions:  07/03/19 call completed with patient   . Evaluation of current treatment plan related to dietary recommendations for DM and CHF and patient's adherence to plan as established by provider. . Provided education to patient re: dietary recommendations appropriate for chronic conditions such as DM and CHF; instructed patient to eat plenty of fruits and vegetables, fresh foods including lean meats, fish, poultry, eggs (no more than 3 per week unless eating egg whites only), milk, yogurt, plain or brown rice, oatmeal, low sodium snacks and reiterated the importance of avoiding the salt shaker . Reviewed medications with patient and discussed patient is taking her Lantus exactly as directed  . Discussed plans with patient for ongoing care management follow up and provided patient with direct contact information for care management team . Provided patient with printed educational materials related to Meal Planning Using the Plate Method and Portion Control . Advised patient, providing education and rationale, to check cbg daily before meals and record, calling the CCM team and or Dr. Baird Cancer for findings outside established parameters.  <80 or >250 . Discussed patient is checking FBS each am with ACCU-CHEK glucometer, today FBS was 91, yesterday it was 158, patient states her average is in the low 100's  Patient Self Care Activities:  . Attends all scheduled provider appointments . Performs ADL's independently . Calls provider office for new concerns or questions . Unable to verbalize appropriate diet restrictions in relation to health conditions  Please see past updates related to this goal by clicking on the "Past Updates" button in the selected goal      . "I have Congestive Heart Failue" (pt-stated)       Current Barriers:  Marland Kitchen Knowledge Deficits related to Self health management for CHF  Nurse Case Manager Clinical Goal(s):  Marland Kitchen  Over the next 30 days, patient will work with the CCM RN to address needs related to CHF . New 10/192020: Over the next 30 days the patient will work with CCM RN to understand the importance of daily weights  CCM SW Interventions: Completed 06/29/2019 . Patient interviewed and appropriate assessments performed . Determined the patient weighed herself on Thursday October 15th with a recorded weight of 156 pounds. Today the patient reports weighing in at 153 pounds . Assessed for protocol for patient to follow regarding weight gain/loss - the patient denies knowledge of specific protocol surrounding weight fluctuations . Assessed for patients daily weight reading between Thursday to today (Monday 10/19) - Patient reports not weighing daily but stated "I am going to start checking every day" . Encouraged the patient to weigh daily and record readings in her calendar . Collaboration with patients primary provider Dr Baird Cancer as well as CCM RN Case Manager regarding patient reported weight loss  CCM RN CM Interventions:  07/03/19 call completed  with patient  . Evaluation of current treatment plan related to CHF and patient's adherence to plan as established by provider. . Advised patient to keep Cardiologist and or PCP well informed of new or worsening heart palpitation, chest pain and or shortness of breath; discussed patient f/u with Kerin Ransom PA-C on 07/02/19 with noted medication changes and recommendations to f/u with him again in 1 month . Reviewed medications with patient and discussed Kerin Ransom PA-C made the following medication changes; STOP Norvasc (Amlodipine); START Toprol (Metoprolol Succinate) 25 mg Take 1 tablet once a day, patient reminded if she needs a refill on her cardiac medications before her next appointment, she should contact her pharmacy to assist; patient verbalizes understanding and has made this medication change; discussed patient's BP today is 133/72 and O2 sat is 98% .  Discussed plans with patient for ongoing care management follow up and provided patient with direct contact information for care management team . Advised patient, providing education and rationale, to weigh daily and record, calling the CCM team and MD Baird Cancer or Gwenlyn Found) for weight gain of 3lbs overnight or 5 pounds in a week.  . Discussed patient has not yet received her printed mailed patient education materials to review  Patient Self Care Activities:  . Self administers medications as prescribed . Attends all scheduled provider appointments . Calls pharmacy for medication refills . Attends church or other social activities . Performs ADL's independently . Performs IADL's independently . Calls provider office for new concerns or questions  Please see past updates related to this goal by clicking on the "Past Updates" button in the selected goal      . "I just feel weak" (pt-stated)       Current Barriers:  Marland Kitchen Knowledge Deficits related to acute onset of "weakness"   Nurse Case Manager Clinical Goal(s):  Marland Kitchen Over the next 14 days, patient will report the acute onset of weakness has resolved as evidence by patient will have increased energy and will be able to complete her daily activities w/o difficulty or fatigue. GOAL MET . Over the next 30 days, patient will not experience ED visits and or IP admission.   CCM RN CM Interventions:  07/03/19 call completed with the patient   . Evaluation of current treatment plan related to new onset of weakness and patient's adherence to plan as established by provider . Discussed patient feels stronger and she continues to work with in home PT - reports finding this therapy effective and her overall stamina has improved . Instructed patient to continue to stay well hydrated and to notify the CCM team and or Dr. Baird Cancer if the weakness reoccurs . Discussed plans with patient for ongoing care management follow up and provided patient with direct  contact information for care management team  Patient Self Care Activities:  . Self administers medications as prescribed . Attends all scheduled provider appointments . Calls pharmacy for medication refills . Performs ADL's independently . Performs IADL's independently . Calls provider office for new concerns or questions  Please see past updates related to this goal by clicking on the "Past Updates" button in the selected goal          Telephone follow up appointment with care management team member scheduled for: 08/04/19  Barb Merino, RN, BSN, CCM Care Management Coordinator [facility]  Direct Phone: [phone]

## 2019-07-09 DIAGNOSIS — I5032 Chronic diastolic (congestive) heart failure: Secondary | ICD-10-CM | POA: Diagnosis not present

## 2019-07-09 DIAGNOSIS — E1122 Type 2 diabetes mellitus with diabetic chronic kidney disease: Secondary | ICD-10-CM | POA: Diagnosis not present

## 2019-07-09 DIAGNOSIS — M1990 Unspecified osteoarthritis, unspecified site: Secondary | ICD-10-CM | POA: Diagnosis not present

## 2019-07-09 DIAGNOSIS — Z7982 Long term (current) use of aspirin: Secondary | ICD-10-CM | POA: Diagnosis not present

## 2019-07-09 DIAGNOSIS — M109 Gout, unspecified: Secondary | ICD-10-CM | POA: Diagnosis not present

## 2019-07-09 DIAGNOSIS — I130 Hypertensive heart and chronic kidney disease with heart failure and stage 1 through stage 4 chronic kidney disease, or unspecified chronic kidney disease: Secondary | ICD-10-CM | POA: Diagnosis not present

## 2019-07-09 DIAGNOSIS — G4733 Obstructive sleep apnea (adult) (pediatric): Secondary | ICD-10-CM | POA: Diagnosis not present

## 2019-07-09 DIAGNOSIS — E785 Hyperlipidemia, unspecified: Secondary | ICD-10-CM | POA: Diagnosis not present

## 2019-07-09 DIAGNOSIS — Z8673 Personal history of transient ischemic attack (TIA), and cerebral infarction without residual deficits: Secondary | ICD-10-CM | POA: Diagnosis not present

## 2019-07-09 DIAGNOSIS — N183 Chronic kidney disease, stage 3 unspecified: Secondary | ICD-10-CM | POA: Diagnosis not present

## 2019-07-09 DIAGNOSIS — Z794 Long term (current) use of insulin: Secondary | ICD-10-CM | POA: Diagnosis not present

## 2019-07-09 DIAGNOSIS — E782 Mixed hyperlipidemia: Secondary | ICD-10-CM | POA: Diagnosis not present

## 2019-07-09 DIAGNOSIS — K5730 Diverticulosis of large intestine without perforation or abscess without bleeding: Secondary | ICD-10-CM | POA: Diagnosis not present

## 2019-07-09 DIAGNOSIS — E559 Vitamin D deficiency, unspecified: Secondary | ICD-10-CM | POA: Diagnosis not present

## 2019-07-09 DIAGNOSIS — Z9181 History of falling: Secondary | ICD-10-CM | POA: Diagnosis not present

## 2019-07-10 ENCOUNTER — Ambulatory Visit (INDEPENDENT_AMBULATORY_CARE_PROVIDER_SITE_OTHER): Payer: Medicare Other | Admitting: Physician Assistant

## 2019-07-10 ENCOUNTER — Ambulatory Visit (HOSPITAL_COMMUNITY)
Admission: RE | Admit: 2019-07-10 | Discharge: 2019-07-10 | Disposition: A | Discharge status: Home / Self Care | Payer: Medicare Other | Source: Ambulatory Visit | Attending: Internal Medicine | Admitting: Internal Medicine

## 2019-07-10 ENCOUNTER — Ambulatory Visit: Payer: Medicare Other | Admitting: Rheumatology

## 2019-07-10 ENCOUNTER — Other Ambulatory Visit: Payer: Self-pay

## 2019-07-10 ENCOUNTER — Ambulatory Visit: Payer: Self-pay

## 2019-07-10 ENCOUNTER — Encounter: Payer: Self-pay | Admitting: Physician Assistant

## 2019-07-10 VITALS — BP 105/56 | HR 78 | Resp 18 | Ht 59.0 in | Wt 160.6 lb

## 2019-07-10 DIAGNOSIS — M19041 Primary osteoarthritis, right hand: Secondary | ICD-10-CM | POA: Diagnosis not present

## 2019-07-10 DIAGNOSIS — I110 Hypertensive heart disease with heart failure: Secondary | ICD-10-CM

## 2019-07-10 DIAGNOSIS — M65322 Trigger finger, left index finger: Secondary | ICD-10-CM

## 2019-07-10 DIAGNOSIS — M170 Bilateral primary osteoarthritis of knee: Secondary | ICD-10-CM | POA: Diagnosis not present

## 2019-07-10 DIAGNOSIS — I5032 Chronic diastolic (congestive) heart failure: Secondary | ICD-10-CM

## 2019-07-10 DIAGNOSIS — N289 Disorder of kidney and ureter, unspecified: Secondary | ICD-10-CM

## 2019-07-10 DIAGNOSIS — Z5181 Encounter for therapeutic drug level monitoring: Secondary | ICD-10-CM

## 2019-07-10 DIAGNOSIS — M19042 Primary osteoarthritis, left hand: Secondary | ICD-10-CM

## 2019-07-10 DIAGNOSIS — Z8719 Personal history of other diseases of the digestive system: Secondary | ICD-10-CM

## 2019-07-10 DIAGNOSIS — M19071 Primary osteoarthritis, right ankle and foot: Secondary | ICD-10-CM

## 2019-07-10 DIAGNOSIS — M79662 Pain in left lower leg: Secondary | ICD-10-CM | POA: Diagnosis not present

## 2019-07-10 DIAGNOSIS — Z9989 Dependence on other enabling machines and devices: Secondary | ICD-10-CM

## 2019-07-10 DIAGNOSIS — G4733 Obstructive sleep apnea (adult) (pediatric): Secondary | ICD-10-CM

## 2019-07-10 DIAGNOSIS — M79642 Pain in left hand: Secondary | ICD-10-CM

## 2019-07-10 DIAGNOSIS — Z8673 Personal history of transient ischemic attack (TIA), and cerebral infarction without residual deficits: Secondary | ICD-10-CM

## 2019-07-10 DIAGNOSIS — M1A09X0 Idiopathic chronic gout, multiple sites, without tophus (tophi): Secondary | ICD-10-CM

## 2019-07-10 DIAGNOSIS — N189 Chronic kidney disease, unspecified: Secondary | ICD-10-CM

## 2019-07-10 DIAGNOSIS — M79641 Pain in right hand: Secondary | ICD-10-CM

## 2019-07-10 DIAGNOSIS — M898X6 Other specified disorders of bone, lower leg: Secondary | ICD-10-CM

## 2019-07-10 DIAGNOSIS — M19072 Primary osteoarthritis, left ankle and foot: Secondary | ICD-10-CM

## 2019-07-10 DIAGNOSIS — Z8639 Personal history of other endocrine, nutritional and metabolic disease: Secondary | ICD-10-CM

## 2019-07-10 MED ORDER — ALLOPURINOL 300 MG PO TABS: 300.0000 mg | ORAL_TABLET | Freq: Every day | ORAL | 0 refills | Status: DC

## 2019-07-10 NOTE — Progress Notes (Signed)
Left lower extremity venous duplex completed. Refer to "CV Proc" under chart review to view preliminary results.  07/10/2019 1:49 PM Maudry Mayhew, MHA, RVT, RDCS, RDMS

## 2019-07-13 ENCOUNTER — Ambulatory Visit (INDEPENDENT_AMBULATORY_CARE_PROVIDER_SITE_OTHER): Payer: Medicare Other | Admitting: Pharmacist

## 2019-07-13 DIAGNOSIS — N183 Chronic kidney disease, stage 3 unspecified: Secondary | ICD-10-CM

## 2019-07-13 DIAGNOSIS — E782 Mixed hyperlipidemia: Secondary | ICD-10-CM

## 2019-07-13 DIAGNOSIS — Z794 Long term (current) use of insulin: Secondary | ICD-10-CM | POA: Diagnosis not present

## 2019-07-13 DIAGNOSIS — E1122 Type 2 diabetes mellitus with diabetic chronic kidney disease: Secondary | ICD-10-CM | POA: Diagnosis not present

## 2019-07-13 NOTE — Patient Instructions (Signed)
Visit Information  Goals Addressed            This Visit's Progress     Patient Stated   . I would like to manage my chronic conditions (pt-stated)       a. DM  i. Reports doing physical therapy and it is helping  ii. BG readings: 90s; only goes above 100 when she "overindulges"  iii. Hypoglycemia: No BG <70; no s/sx of hypoglycemia  iv. Diet: 2 meals (breakfast, dinner) 2 snacks (lunch, after dinner)   1. Breakfast: cereal, sausage or [place] bacon, banana  2. Dinner: greens, turkey/chicken  3. Snacks: graham crackers, chicken, potato salad, macaroni, peanut butter/jelly sandwich  4. Drinks: cherry juice, coffee, hot chocolate, water, milk  5. Exercise: numbness in left leg, tries to walk up the hallway at [facility], uses roller chair, not walking frequently b/c she almost passed out   vi. HbA1c (06/03/19): 6.9  vii. EGFR (06/11/19): 33  viii. ASCVD risk: cannot calculate (age)  ix. Current meds: insulin glargine (Lantus) 12 units QHS   1. Affordability: Pt receives all meds through 1199 for a $0 copay except for allopurinol and colchicine which she receives through [facility]  x. Prior meds: N/A  xi. ACEi/ARB, ASA81, statin? yes, yes, yes  xii. Foot exam: N/A  xiii. Eye exam: retinopathy (01/2019)  xiv. HBA1c goal <8% (FPG 90-150; Bedtime 100-180)   xv. Plan:  1. Continue insulin glargine (Lantus) 12 units QHS  2. F/u with pt via telephone 08/05/19 in the morning as pt prefers to be followed monthly   Initial goal documentation        The patient verbalized understanding of instructions provided today and declined a print copy of patient instruction materials.   The care management team will reach out to the patient again over the next 30 days.   SIGNATURE Regina Eck, PharmD, BCPS Clinical Pharmacist, [facility] [place]: [phone]

## 2019-07-13 NOTE — Progress Notes (Signed)
Chronic Care Management   Initial Visit Note  07/13/2019 Name: Maria [name] MRN: [id number] DOB: 1926/09/04  Referred by: Glendale Chard, MD Reason for referral : Chronic Care Management   Maria [name] is a 85 y.o. year old female who is a primary care patient of Glendale Chard, MD. The CCM team was consulted for assistance with chronic disease management and care coordination needs related to HLD and DMII  Review of patient status, including review of consultants reports, relevant laboratory and other test results, and collaboration with appropriate care team members and the patient's provider was performed as part of comprehensive patient evaluation and provision of chronic care management services.      Advanced Directives Status: N See Care Plan and Vynca application for related entries.   Medications: Outpatient Encounter Medications as of 07/13/2019  Medication Sig  . Accu-Chek FastClix Lancets MISC CHECK BLOOD SUGAR BEFORE BREAKFAST AND DINNER  . ACCU-CHEK SMARTVIEW test strip U TO CHECK BID BEFORE BRE AND DINNER dx: e11.65  . allopurinol (ZYLOPRIM) 300 MG tablet Take 1 tablet (300 mg total) by mouth daily.  Marland Kitchen aspirin 81 MG tablet Take 1 tablet (81 mg total) by mouth daily.  Marland Kitchen atorvastatin (LIPITOR) 40 MG tablet TAKE 1 TABLET DAILY  . Cholecalciferol (VITAMIN D) 2000 UNITS tablet Take 2,000 Units by mouth daily.  . citalopram (CELEXA) 20 MG tablet Take 20 mg by mouth daily.  . colchicine 0.6 MG tablet Take 1 tablet (0.6 mg total) by mouth daily.  Marland Kitchen docusate sodium (COLACE) 100 MG capsule Take 100 mg by mouth daily.  Marland Kitchen donepezil (ARICEPT) 10 MG tablet TAKE 1 TABLET DAILY IN THE EVENING  . ezetimibe (ZETIA) 10 MG tablet TAKE 1 TABLET AT BEDTIME  . FOLBIC 2.5-25-2 MG TABS tablet TAKE 1 TABLET DAILY  . furosemide (LASIX) 40 MG tablet TAKE 1 TABLET TWICE A DAY  . hydrALAZINE (APRESOLINE) 25 MG tablet TAKE 1 TABLET THREE TIMES A DAY  . Insulin Pen Needle (BD PEN NEEDLE MICRO  U/F) 32G X 6 MM MISC Use as directed  . isosorbide mononitrate (IMDUR) 30 MG 24 hr tablet Take 1 tablet (30 mg total) by mouth daily.  Marland Kitchen LANTUS SOLOSTAR 100 UNIT/ML Solostar Pen INJECT 20 UNITS AT NIGHT UNDER THE SKIN AS PER INSULIN PROTOCOL  . loratadine (CLARITIN) 10 MG tablet Take 10 mg by mouth every morning.  . Magnesium 250 MG TABS Take by mouth at bedtime.  . metolazone (ZAROXOLYN) 2.5 MG tablet TAKE 1 TABLET EVERY OTHER DAY 30 MINUTES PRIOR TO YOUR MORNING LASIX DOSE  . metoprolol succinate (TOPROL-XL) 25 MG 24 hr tablet TAKE 1 TABLET(25 MG) BY MOUTH DAILY  . moxifloxacin (VIGAMOX) 0.5 % ophthalmic solution INT 1 GTT IN OS QID FOR 14 DAYS  . telmisartan (MICARDIS) 80 MG tablet TAKE 1 TABLET DAILY  . [DISCONTINUED] clonazePAM (KLONOPIN) 0.5 MG tablet Take 0.5 mg by mouth at bedtime.   No facility-administered encounter medications on file as of 07/13/2019.      Objective:   Goals Addressed            This Visit's Progress     Patient Stated   . I would like to manage my chronic conditions (pt-stated)       a. DM  i. Reports doing physical therapy and it is helping  ii. BG readings: 90s; only goes above 100 when she "overindulges"  iii. Hypoglycemia: No BG <70; no s/sx of hypoglycemia  iv. Diet: 2 meals (breakfast, dinner)  2 snacks (lunch, after dinner)   1. Breakfast: cereal, sausage or [place] bacon, banana  2. Dinner: greens, turkey/chicken  3. Snacks: graham crackers, chicken, potato salad, macaroni, peanut butter/jelly sandwich  4. Drinks: cherry juice, coffee, hot chocolate, water, milk  5. Exercise: numbness in left leg, tries to walk up the hallway at [facility], uses roller chair, not walking frequently b/c she almost passed out   vi. HbA1c (06/03/19): 6.9  vii. EGFR (06/11/19): 33  viii. ASCVD risk: cannot calculate (age)  ix. Current meds: insulin glargine (Lantus) 12 units QHS   1. Affordability: Pt receives all meds through [address] for a $0 copay except for  allopurinol and colchicine which she receives through [employer]  x. Prior meds: N/A  xi. ACEi/ARB, ASA81, statin? yes, yes, yes  xii. Foot exam: N/A  xiii. Eye exam: retinopathy (01/2019)  xiv. HBA1c goal <8% (FPG 90-150; Bedtime 100-180)   xv. Plan:  1. Continue insulin glargine (Lantus) 12 units QHS  2. F/u with pt via telephone 08/05/19 in the morning as pt prefers to be followed monthly   Initial goal documentation          Plan:   The care management team will reach out to the patient again over the next 30 days.   Provider Signature  Drexel Iha, PharmD PGY2 ambulatory care residency, [place]  Regina Eck, PharmD, BCPS Clinical Pharmacist, [facility] [place]: [phone]

## 2019-07-14 DIAGNOSIS — Z9181 History of falling: Secondary | ICD-10-CM | POA: Diagnosis not present

## 2019-07-14 DIAGNOSIS — E782 Mixed hyperlipidemia: Secondary | ICD-10-CM | POA: Diagnosis not present

## 2019-07-14 DIAGNOSIS — I130 Hypertensive heart and chronic kidney disease with heart failure and stage 1 through stage 4 chronic kidney disease, or unspecified chronic kidney disease: Secondary | ICD-10-CM | POA: Diagnosis not present

## 2019-07-14 DIAGNOSIS — E1122 Type 2 diabetes mellitus with diabetic chronic kidney disease: Secondary | ICD-10-CM | POA: Diagnosis not present

## 2019-07-14 DIAGNOSIS — E785 Hyperlipidemia, unspecified: Secondary | ICD-10-CM | POA: Diagnosis not present

## 2019-07-14 DIAGNOSIS — M1990 Unspecified osteoarthritis, unspecified site: Secondary | ICD-10-CM | POA: Diagnosis not present

## 2019-07-14 DIAGNOSIS — Z794 Long term (current) use of insulin: Secondary | ICD-10-CM | POA: Diagnosis not present

## 2019-07-14 DIAGNOSIS — K5730 Diverticulosis of large intestine without perforation or abscess without bleeding: Secondary | ICD-10-CM | POA: Diagnosis not present

## 2019-07-14 DIAGNOSIS — M109 Gout, unspecified: Secondary | ICD-10-CM | POA: Diagnosis not present

## 2019-07-14 DIAGNOSIS — Z8673 Personal history of transient ischemic attack (TIA), and cerebral infarction without residual deficits: Secondary | ICD-10-CM | POA: Diagnosis not present

## 2019-07-14 DIAGNOSIS — G4733 Obstructive sleep apnea (adult) (pediatric): Secondary | ICD-10-CM | POA: Diagnosis not present

## 2019-07-14 DIAGNOSIS — N183 Chronic kidney disease, stage 3 unspecified: Secondary | ICD-10-CM | POA: Diagnosis not present

## 2019-07-14 DIAGNOSIS — E559 Vitamin D deficiency, unspecified: Secondary | ICD-10-CM | POA: Diagnosis not present

## 2019-07-14 DIAGNOSIS — I5032 Chronic diastolic (congestive) heart failure: Secondary | ICD-10-CM | POA: Diagnosis not present

## 2019-07-14 DIAGNOSIS — Z7982 Long term (current) use of aspirin: Secondary | ICD-10-CM | POA: Diagnosis not present

## 2019-07-16 DIAGNOSIS — G4733 Obstructive sleep apnea (adult) (pediatric): Secondary | ICD-10-CM | POA: Diagnosis not present

## 2019-07-16 DIAGNOSIS — E559 Vitamin D deficiency, unspecified: Secondary | ICD-10-CM | POA: Diagnosis not present

## 2019-07-16 DIAGNOSIS — I130 Hypertensive heart and chronic kidney disease with heart failure and stage 1 through stage 4 chronic kidney disease, or unspecified chronic kidney disease: Secondary | ICD-10-CM | POA: Diagnosis not present

## 2019-07-16 DIAGNOSIS — E1122 Type 2 diabetes mellitus with diabetic chronic kidney disease: Secondary | ICD-10-CM | POA: Diagnosis not present

## 2019-07-16 DIAGNOSIS — M109 Gout, unspecified: Secondary | ICD-10-CM | POA: Diagnosis not present

## 2019-07-16 DIAGNOSIS — Z9181 History of falling: Secondary | ICD-10-CM | POA: Diagnosis not present

## 2019-07-16 DIAGNOSIS — E782 Mixed hyperlipidemia: Secondary | ICD-10-CM | POA: Diagnosis not present

## 2019-07-16 DIAGNOSIS — I5032 Chronic diastolic (congestive) heart failure: Secondary | ICD-10-CM | POA: Diagnosis not present

## 2019-07-16 DIAGNOSIS — E785 Hyperlipidemia, unspecified: Secondary | ICD-10-CM | POA: Diagnosis not present

## 2019-07-16 DIAGNOSIS — N183 Chronic kidney disease, stage 3 unspecified: Secondary | ICD-10-CM | POA: Diagnosis not present

## 2019-07-16 DIAGNOSIS — Z794 Long term (current) use of insulin: Secondary | ICD-10-CM | POA: Diagnosis not present

## 2019-07-16 DIAGNOSIS — Z8673 Personal history of transient ischemic attack (TIA), and cerebral infarction without residual deficits: Secondary | ICD-10-CM | POA: Diagnosis not present

## 2019-07-16 DIAGNOSIS — Z7982 Long term (current) use of aspirin: Secondary | ICD-10-CM | POA: Diagnosis not present

## 2019-07-16 DIAGNOSIS — K5730 Diverticulosis of large intestine without perforation or abscess without bleeding: Secondary | ICD-10-CM | POA: Diagnosis not present

## 2019-07-16 DIAGNOSIS — M1990 Unspecified osteoarthritis, unspecified site: Secondary | ICD-10-CM | POA: Diagnosis not present

## 2019-07-20 DIAGNOSIS — Z9181 History of falling: Secondary | ICD-10-CM | POA: Diagnosis not present

## 2019-07-20 DIAGNOSIS — M109 Gout, unspecified: Secondary | ICD-10-CM | POA: Diagnosis not present

## 2019-07-20 DIAGNOSIS — E785 Hyperlipidemia, unspecified: Secondary | ICD-10-CM | POA: Diagnosis not present

## 2019-07-20 DIAGNOSIS — M1990 Unspecified osteoarthritis, unspecified site: Secondary | ICD-10-CM | POA: Diagnosis not present

## 2019-07-20 DIAGNOSIS — G4733 Obstructive sleep apnea (adult) (pediatric): Secondary | ICD-10-CM | POA: Diagnosis not present

## 2019-07-20 DIAGNOSIS — I5032 Chronic diastolic (congestive) heart failure: Secondary | ICD-10-CM | POA: Diagnosis not present

## 2019-07-20 DIAGNOSIS — Z7982 Long term (current) use of aspirin: Secondary | ICD-10-CM | POA: Diagnosis not present

## 2019-07-20 DIAGNOSIS — E782 Mixed hyperlipidemia: Secondary | ICD-10-CM | POA: Diagnosis not present

## 2019-07-20 DIAGNOSIS — Z8673 Personal history of transient ischemic attack (TIA), and cerebral infarction without residual deficits: Secondary | ICD-10-CM | POA: Diagnosis not present

## 2019-07-20 DIAGNOSIS — E1122 Type 2 diabetes mellitus with diabetic chronic kidney disease: Secondary | ICD-10-CM | POA: Diagnosis not present

## 2019-07-20 DIAGNOSIS — N183 Chronic kidney disease, stage 3 unspecified: Secondary | ICD-10-CM | POA: Diagnosis not present

## 2019-07-20 DIAGNOSIS — K5730 Diverticulosis of large intestine without perforation or abscess without bleeding: Secondary | ICD-10-CM | POA: Diagnosis not present

## 2019-07-20 DIAGNOSIS — I130 Hypertensive heart and chronic kidney disease with heart failure and stage 1 through stage 4 chronic kidney disease, or unspecified chronic kidney disease: Secondary | ICD-10-CM | POA: Diagnosis not present

## 2019-07-20 DIAGNOSIS — Z794 Long term (current) use of insulin: Secondary | ICD-10-CM | POA: Diagnosis not present

## 2019-07-20 DIAGNOSIS — E559 Vitamin D deficiency, unspecified: Secondary | ICD-10-CM | POA: Diagnosis not present

## 2019-07-22 ENCOUNTER — Other Ambulatory Visit: Payer: Self-pay

## 2019-07-22 DIAGNOSIS — I5032 Chronic diastolic (congestive) heart failure: Secondary | ICD-10-CM | POA: Diagnosis not present

## 2019-07-22 DIAGNOSIS — M1990 Unspecified osteoarthritis, unspecified site: Secondary | ICD-10-CM | POA: Diagnosis not present

## 2019-07-22 DIAGNOSIS — Z794 Long term (current) use of insulin: Secondary | ICD-10-CM | POA: Diagnosis not present

## 2019-07-22 DIAGNOSIS — K5730 Diverticulosis of large intestine without perforation or abscess without bleeding: Secondary | ICD-10-CM | POA: Diagnosis not present

## 2019-07-22 DIAGNOSIS — N183 Chronic kidney disease, stage 3 unspecified: Secondary | ICD-10-CM | POA: Diagnosis not present

## 2019-07-22 DIAGNOSIS — M109 Gout, unspecified: Secondary | ICD-10-CM | POA: Diagnosis not present

## 2019-07-22 DIAGNOSIS — I130 Hypertensive heart and chronic kidney disease with heart failure and stage 1 through stage 4 chronic kidney disease, or unspecified chronic kidney disease: Secondary | ICD-10-CM | POA: Diagnosis not present

## 2019-07-22 DIAGNOSIS — E785 Hyperlipidemia, unspecified: Secondary | ICD-10-CM | POA: Diagnosis not present

## 2019-07-22 DIAGNOSIS — Z8673 Personal history of transient ischemic attack (TIA), and cerebral infarction without residual deficits: Secondary | ICD-10-CM | POA: Diagnosis not present

## 2019-07-22 DIAGNOSIS — G4733 Obstructive sleep apnea (adult) (pediatric): Secondary | ICD-10-CM | POA: Diagnosis not present

## 2019-07-22 DIAGNOSIS — E782 Mixed hyperlipidemia: Secondary | ICD-10-CM | POA: Diagnosis not present

## 2019-07-22 DIAGNOSIS — Z9181 History of falling: Secondary | ICD-10-CM | POA: Diagnosis not present

## 2019-07-22 DIAGNOSIS — Z7982 Long term (current) use of aspirin: Secondary | ICD-10-CM | POA: Diagnosis not present

## 2019-07-22 DIAGNOSIS — E559 Vitamin D deficiency, unspecified: Secondary | ICD-10-CM | POA: Diagnosis not present

## 2019-07-22 DIAGNOSIS — E1122 Type 2 diabetes mellitus with diabetic chronic kidney disease: Secondary | ICD-10-CM | POA: Diagnosis not present

## 2019-07-22 MED ORDER — ACCU-CHEK FASTCLIX LANCETS MISC: 1 refills | Status: DC

## 2019-07-23 DIAGNOSIS — G4733 Obstructive sleep apnea (adult) (pediatric): Secondary | ICD-10-CM | POA: Diagnosis not present

## 2019-07-23 DIAGNOSIS — Z7982 Long term (current) use of aspirin: Secondary | ICD-10-CM | POA: Diagnosis not present

## 2019-07-23 DIAGNOSIS — E782 Mixed hyperlipidemia: Secondary | ICD-10-CM | POA: Diagnosis not present

## 2019-07-23 DIAGNOSIS — E559 Vitamin D deficiency, unspecified: Secondary | ICD-10-CM | POA: Diagnosis not present

## 2019-07-23 DIAGNOSIS — Z9181 History of falling: Secondary | ICD-10-CM | POA: Diagnosis not present

## 2019-07-23 DIAGNOSIS — E785 Hyperlipidemia, unspecified: Secondary | ICD-10-CM | POA: Diagnosis not present

## 2019-07-23 DIAGNOSIS — M109 Gout, unspecified: Secondary | ICD-10-CM | POA: Diagnosis not present

## 2019-07-23 DIAGNOSIS — M1990 Unspecified osteoarthritis, unspecified site: Secondary | ICD-10-CM | POA: Diagnosis not present

## 2019-07-23 DIAGNOSIS — Z794 Long term (current) use of insulin: Secondary | ICD-10-CM | POA: Diagnosis not present

## 2019-07-23 DIAGNOSIS — E1122 Type 2 diabetes mellitus with diabetic chronic kidney disease: Secondary | ICD-10-CM | POA: Diagnosis not present

## 2019-07-23 DIAGNOSIS — K5730 Diverticulosis of large intestine without perforation or abscess without bleeding: Secondary | ICD-10-CM | POA: Diagnosis not present

## 2019-07-23 DIAGNOSIS — N183 Chronic kidney disease, stage 3 unspecified: Secondary | ICD-10-CM | POA: Diagnosis not present

## 2019-07-23 DIAGNOSIS — I5032 Chronic diastolic (congestive) heart failure: Secondary | ICD-10-CM | POA: Diagnosis not present

## 2019-07-23 DIAGNOSIS — Z8673 Personal history of transient ischemic attack (TIA), and cerebral infarction without residual deficits: Secondary | ICD-10-CM | POA: Diagnosis not present

## 2019-07-23 DIAGNOSIS — I130 Hypertensive heart and chronic kidney disease with heart failure and stage 1 through stage 4 chronic kidney disease, or unspecified chronic kidney disease: Secondary | ICD-10-CM | POA: Diagnosis not present

## 2019-07-27 ENCOUNTER — Telehealth: Payer: Self-pay

## 2019-07-27 ENCOUNTER — Ambulatory Visit: Payer: Self-pay

## 2019-07-27 DIAGNOSIS — Z794 Long term (current) use of insulin: Secondary | ICD-10-CM | POA: Diagnosis not present

## 2019-07-27 DIAGNOSIS — G4733 Obstructive sleep apnea (adult) (pediatric): Secondary | ICD-10-CM | POA: Diagnosis not present

## 2019-07-27 DIAGNOSIS — E559 Vitamin D deficiency, unspecified: Secondary | ICD-10-CM | POA: Diagnosis not present

## 2019-07-27 DIAGNOSIS — I130 Hypertensive heart and chronic kidney disease with heart failure and stage 1 through stage 4 chronic kidney disease, or unspecified chronic kidney disease: Secondary | ICD-10-CM | POA: Diagnosis not present

## 2019-07-27 DIAGNOSIS — M1990 Unspecified osteoarthritis, unspecified site: Secondary | ICD-10-CM | POA: Diagnosis not present

## 2019-07-27 DIAGNOSIS — M109 Gout, unspecified: Secondary | ICD-10-CM | POA: Diagnosis not present

## 2019-07-27 DIAGNOSIS — Z8673 Personal history of transient ischemic attack (TIA), and cerebral infarction without residual deficits: Secondary | ICD-10-CM | POA: Diagnosis not present

## 2019-07-27 DIAGNOSIS — I5032 Chronic diastolic (congestive) heart failure: Secondary | ICD-10-CM | POA: Diagnosis not present

## 2019-07-27 DIAGNOSIS — E1122 Type 2 diabetes mellitus with diabetic chronic kidney disease: Secondary | ICD-10-CM | POA: Diagnosis not present

## 2019-07-27 DIAGNOSIS — K5730 Diverticulosis of large intestine without perforation or abscess without bleeding: Secondary | ICD-10-CM | POA: Diagnosis not present

## 2019-07-27 DIAGNOSIS — Z9181 History of falling: Secondary | ICD-10-CM | POA: Diagnosis not present

## 2019-07-27 DIAGNOSIS — Z7982 Long term (current) use of aspirin: Secondary | ICD-10-CM | POA: Diagnosis not present

## 2019-07-27 DIAGNOSIS — E782 Mixed hyperlipidemia: Secondary | ICD-10-CM | POA: Diagnosis not present

## 2019-07-27 DIAGNOSIS — N183 Chronic kidney disease, stage 3 unspecified: Secondary | ICD-10-CM | POA: Diagnosis not present

## 2019-07-27 DIAGNOSIS — E785 Hyperlipidemia, unspecified: Secondary | ICD-10-CM | POA: Diagnosis not present

## 2019-07-27 NOTE — Chronic Care Management (AMB) (Signed)
  Chronic Care Management   Outreach Note  07/27/2019 Name: Maria [name] MRN: [id number] DOB: November 05, 1925  Referred by: Glendale Chard, MD Reason for referral : Care Coordination   SW placed an unsuccessful outbound call to the patient to assist with care coordination and assess patient interest in referral to meals on wheels. SW left a HIPAA compliant voice message requesting a return call.  Follow Up Plan: The care management team will reach out to the patient again over the next 14 days.   Daneen Schick, BSW, CDP Social Worker, Certified Dementia Practitioner [place] [facility] [phone]

## 2019-07-28 ENCOUNTER — Telehealth: Payer: Self-pay

## 2019-07-28 NOTE — Telephone Encounter (Signed)
Contacted patient to offer virtual ov; patient agreeable appt moved.        Virtual Visit Pre-Appointment Phone Call  "(Name), I am calling you today to discuss your upcoming appointment. We are currently trying to limit exposure to the virus that causes COVID-19 by seeing patients at home rather than in the office."  1. "What is the BEST phone number to call the day of the visit?" - include this in appointment notes  2. "Do you have or have access to (through a family member/friend) a smartphone with video capability that we can use for your visit?" a. If yes - list this number in appt notes as "cell" (if different from BEST phone #) and list the appointment type as a VIDEO visit in appointment notes b. If no - list the appointment type as a PHONE visit in appointment notes  3. Confirm consent - "In the setting of the current Covid19 crisis, you are scheduled for a (phone or video) visit with your provider on (date) at (time).  Just as we do with many in-office visits, in order for you to participate in this visit, we must obtain consent.  If you'd like, I can send this to your mychart (if signed up) or email for you to review.  Otherwise, I can obtain your verbal consent now.  All virtual visits are billed to your insurance company just like a normal visit would be.  By agreeing to a virtual visit, we'd like you to understand that the technology does not allow for your provider to perform an examination, and thus may limit your provider's ability to fully assess your condition. If your provider identifies any concerns that need to be evaluated in person, we will make arrangements to do so.  Finally, though the technology is pretty good, we cannot assure that it will always work on either your or our end, and in the setting of a video visit, we may have to convert it to a phone-only visit.  In either situation, we cannot ensure that we have a secure connection.  Are you willing to proceed?" STAFF:  Did the patient verbally acknowledge consent to telehealth visit? Document YES/NO here: YES  4. Advise patient to be prepared - "Two hours prior to your appointment, go ahead and check your blood pressure, pulse, oxygen saturation, and your weight (if you have the equipment to check those) and write them all down. When your visit starts, your provider will ask you for this information. If you have an Apple Watch or Kardia device, please plan to have heart rate information ready on the day of your appointment. Please have a pen and paper handy nearby the day of the visit as well."  5. Give patient instructions for MyChart download to smartphone OR Doximity/Doxy.me as below if video visit (depending on what platform provider is using)  6. Inform patient they will receive a phone call 15 minutes prior to their appointment time (may be from unknown caller ID) so they should be prepared to answer    [place] has been deemed a candidate for a follow-up tele-health visit to limit community exposure during the Covid-19 pandemic. I spoke with the patient via phone to ensure availability of phone/video source, confirm preferred email & phone number, and discuss instructions and expectations.  I reminded Maria [name] to be prepared with any vital sign and/or heart rhythm information that could potentially be obtained via home monitoring, at the time  of her visit. I reminded Maria [name] to expect a phone call prior to her visit.  Harold Hedge, CMA 07/28/2019 12:19 PM   INSTRUCTIONS FOR DOWNLOADING THE MYCHART APP TO SMARTPHONE  - The patient must first make sure to have activated MyChart and know their login information - If Apple, go to [employer] and type in MyChart in the search bar and download the app. If Android, ask patient to go to [employer] and type in [place] in the search bar and download the app. The app is free but as with any other app  downloads, their phone may require them to verify saved payment information or Apple/Android password.  - The patient will need to then log into the app with their MyChart username and password, and select [facility] as their healthcare provider to link the account. When it is time for your visit, go to the MyChart app, find appointments, and click Begin Video Visit. Be sure to Select Allow for your device to access the Microphone and Camera for your visit. You will then be connected, and your provider will be with you shortly.  **If they have any issues connecting, or need assistance please contact MyChart service desk [phone])**  **If using a computer, in order to ensure the best quality for their visit they will need to use either of the following Internet Browsers: [employer], or Google Chrome**  IF USING DOXIMITY or DOXY.ME - The patient will receive a link just prior to their visit by text.     FULL LENGTH CONSENT FOR TELE-HEALTH VISIT   I hereby voluntarily request, consent and authorize [facility] and its employed or contracted physicians, physician assistants, nurse practitioners or other licensed health care professionals (the Practitioner), to provide me with telemedicine health care services (the "Services") as deemed necessary by the treating Practitioner. I acknowledge and consent to receive the Services by the Practitioner via telemedicine. I understand that the telemedicine visit will involve communicating with the Practitioner through live audiovisual communication technology and the disclosure of certain medical information by electronic transmission. I acknowledge that I have been given the opportunity to request an in-person assessment or other available alternative prior to the telemedicine visit and am voluntarily participating in the telemedicine visit.  I understand that I have the right to withhold or withdraw my consent to the use of telemedicine  in the course of my care at any time, without affecting my right to future care or treatment, and that the Practitioner or I may terminate the telemedicine visit at any time. I understand that I have the right to inspect all information obtained and/or recorded in the course of the telemedicine visit and may receive copies of available information for a reasonable fee.  I understand that some of the potential risks of receiving the Services via telemedicine include:  Marland Kitchen Delay or interruption in medical evaluation due to technological equipment failure or disruption; . Information transmitted may not be sufficient (e.g. poor resolution of images) to allow for appropriate medical decision making by the Practitioner; and/or  . In rare instances, security protocols could fail, causing a breach of personal health information.  Furthermore, I acknowledge that it is my responsibility to provide information about my medical history, conditions and care that is complete and accurate to the best of my ability. I acknowledge that Practitioner's advice, recommendations, and/or decision may be based on factors not within their control, such as incomplete or inaccurate data provided  by me or distortions of diagnostic images or specimens that may result from electronic transmissions. I understand that the practice of medicine is not an exact science and that Practitioner makes no warranties or guarantees regarding treatment outcomes. I acknowledge that I will receive a copy of this consent concurrently upon execution via email to the email address I last provided but may also request a printed copy by calling the office of [facility].    I understand that my insurance will be billed for this visit.   I have read or had this consent read to me. . I understand the contents of this consent, which adequately explains the benefits and risks of the Services being provided via telemedicine.  . I have been provided ample  opportunity to ask questions regarding this consent and the Services and have had my questions answered to my satisfaction. . I give my informed consent for the services to be provided through the use of telemedicine in my medical care  By participating in this telemedicine visit I agree to the above.

## 2019-07-29 ENCOUNTER — Ambulatory Visit: Payer: Medicare Other | Admitting: Cardiology

## 2019-07-30 ENCOUNTER — Ambulatory Visit: Payer: Self-pay

## 2019-07-30 ENCOUNTER — Telehealth: Payer: Self-pay

## 2019-07-30 DIAGNOSIS — E782 Mixed hyperlipidemia: Secondary | ICD-10-CM

## 2019-07-30 DIAGNOSIS — Z794 Long term (current) use of insulin: Secondary | ICD-10-CM

## 2019-07-30 DIAGNOSIS — E1122 Type 2 diabetes mellitus with diabetic chronic kidney disease: Secondary | ICD-10-CM

## 2019-07-30 NOTE — Chronic Care Management (AMB) (Signed)
  Chronic Care Management   Outreach Note  07/30/2019 Name: Maria [name] MRN: [id number] DOB: [birth year]  Referred by: Glendale Chard, MD Reason for referral : Care Coordination   SW placed a second unsuccessful outbound call to the patient to assess interest in referral to mobile meals. SW left a HIPAA compliant voice message requesting a return call.  Follow Up Plan: The care management team will reach out to the patient again over the next 21 days.   Daneen Schick, BSW, CDP Social Worker, Certified Dementia Practitioner [facility] [phone]

## 2019-07-31 DIAGNOSIS — E782 Mixed hyperlipidemia: Secondary | ICD-10-CM | POA: Diagnosis not present

## 2019-07-31 DIAGNOSIS — I5032 Chronic diastolic (congestive) heart failure: Secondary | ICD-10-CM | POA: Diagnosis not present

## 2019-07-31 DIAGNOSIS — E785 Hyperlipidemia, unspecified: Secondary | ICD-10-CM | POA: Diagnosis not present

## 2019-07-31 DIAGNOSIS — Z794 Long term (current) use of insulin: Secondary | ICD-10-CM | POA: Diagnosis not present

## 2019-07-31 DIAGNOSIS — K5730 Diverticulosis of large intestine without perforation or abscess without bleeding: Secondary | ICD-10-CM | POA: Diagnosis not present

## 2019-07-31 DIAGNOSIS — Z7982 Long term (current) use of aspirin: Secondary | ICD-10-CM | POA: Diagnosis not present

## 2019-07-31 DIAGNOSIS — M109 Gout, unspecified: Secondary | ICD-10-CM | POA: Diagnosis not present

## 2019-07-31 DIAGNOSIS — I130 Hypertensive heart and chronic kidney disease with heart failure and stage 1 through stage 4 chronic kidney disease, or unspecified chronic kidney disease: Secondary | ICD-10-CM | POA: Diagnosis not present

## 2019-07-31 DIAGNOSIS — G4733 Obstructive sleep apnea (adult) (pediatric): Secondary | ICD-10-CM | POA: Diagnosis not present

## 2019-07-31 DIAGNOSIS — Z9181 History of falling: Secondary | ICD-10-CM | POA: Diagnosis not present

## 2019-07-31 DIAGNOSIS — E559 Vitamin D deficiency, unspecified: Secondary | ICD-10-CM | POA: Diagnosis not present

## 2019-07-31 DIAGNOSIS — E1122 Type 2 diabetes mellitus with diabetic chronic kidney disease: Secondary | ICD-10-CM | POA: Diagnosis not present

## 2019-07-31 DIAGNOSIS — N183 Chronic kidney disease, stage 3 unspecified: Secondary | ICD-10-CM | POA: Diagnosis not present

## 2019-07-31 DIAGNOSIS — M1990 Unspecified osteoarthritis, unspecified site: Secondary | ICD-10-CM | POA: Diagnosis not present

## 2019-07-31 DIAGNOSIS — Z8673 Personal history of transient ischemic attack (TIA), and cerebral infarction without residual deficits: Secondary | ICD-10-CM | POA: Diagnosis not present

## 2019-08-03 ENCOUNTER — Ambulatory Visit: Payer: Medicare Other | Admitting: Cardiology

## 2019-08-04 ENCOUNTER — Encounter: Payer: Self-pay | Admitting: *Deleted

## 2019-08-04 ENCOUNTER — Telehealth: Payer: Self-pay

## 2019-08-04 ENCOUNTER — Telehealth (INDEPENDENT_AMBULATORY_CARE_PROVIDER_SITE_OTHER): Payer: Medicare Other | Admitting: Cardiology

## 2019-08-04 ENCOUNTER — Encounter: Payer: Self-pay | Admitting: Cardiology

## 2019-08-04 VITALS — BP 148/76 | HR 60 | Ht 59.0 in | Wt 154.0 lb

## 2019-08-04 DIAGNOSIS — R002 Palpitations: Secondary | ICD-10-CM | POA: Diagnosis not present

## 2019-08-04 DIAGNOSIS — R0609 Other forms of dyspnea: Secondary | ICD-10-CM

## 2019-08-04 DIAGNOSIS — G4733 Obstructive sleep apnea (adult) (pediatric): Secondary | ICD-10-CM

## 2019-08-04 DIAGNOSIS — R0600 Dyspnea, unspecified: Secondary | ICD-10-CM

## 2019-08-04 DIAGNOSIS — E119 Type 2 diabetes mellitus without complications: Secondary | ICD-10-CM

## 2019-08-04 MED ORDER — METOPROLOL SUCCINATE ER 25 MG PO TB24: ORAL_TABLET | ORAL | 0 refills | Status: DC

## 2019-08-04 NOTE — Progress Notes (Signed)
Virtual Visit via Telephone Note   This visit type was conducted due to national recommendations for restrictions regarding the COVID-19 Pandemic (e.g. social distancing) in an effort to limit this patient's exposure and mitigate transmission in our community.  Due to her co-morbid illnesses, this patient is at least at moderate risk for complications without adequate follow up.  This format is felt to be most appropriate for this patient at this time.  The patient did not have access to video technology/had technical difficulties with video requiring transitioning to audio format only (telephone).  All issues noted in this document were discussed and addressed.  No physical exam could be performed with this format.  Please refer to the patient's chart for her  consent to telehealth for [facility].   Date:  08/04/2019   ID:  ANAM HANIF, DOB 14-Aug-1926, MRN [id number]  Patient Location: Home Provider Location: Home  PCP:  Glendale Chard, MD  Cardiologist:  Quay Burow, MD  Electrophysiologist:  None   Evaluation Performed:  Follow-Up Visit  Chief Complaint:  none  History of Present Illness:    Maria [name] is a delightful 85 y.o. female with a hx of chronic LE edema, HCVD, diastolic CHF, sleep apnea on C-pap, and prior normal coronaries and normal LVF. Her last echo was Aug 2020 and showed hyperdynamic systolic function, with an ejection fraction of >65%. The cavity size was normal. severe basal septal hypertrophy. Left ventricular diastolic Doppler parameters are consistent with impaired relaxation.   She has recently had problems with palpitations.  Dr Gwenlyn Found ordered a ZIO in Sept for palpitations. It was mailed in Oct 17th but the results are still not available.  I did place her on Toprol at my office visit in Oct and she says her palpitations have resolved.  Her main complaint is arthritis.  She is now getting PT for this.   The patient does not have symptoms  concerning for COVID-19 infection (fever, chills, cough, or new shortness of breath).    Past Medical History:  Diagnosis Date  . Anxiety   . Arthritis   . Blood transfusion   . Depression   . Diabetes mellitus   . Edema   . Heart murmur   . Hyperlipidemia   . Hypertension   . Sleep apnea    cpap  . Stroke ([place])    3 x tias  . TIA (transient ischemic attack)    Past Surgical History:  Procedure Laterality Date  . ABDOMINAL HYSTERECTOMY    . APPENDECTOMY    . NM MYOVIEW LTD  [id number]   post stress left ventricle is normal in size, ejection fraction is 65%, normal myocardial perfusion study, low risk scan  . PARTIAL HYSTERECTOMY    . TRANSTHORACIC ECHOCARDIOGRAM  [id number]     Current Meds  Medication Sig  . Accu-Chek FastClix Lancets MISC CHECK BLOOD SUGAR BEFORE BREAKFAST AND DINNER  . ACCU-CHEK SMARTVIEW test strip U TO CHECK BID BEFORE BRE AND DINNER dx: e11.65  . allopurinol (ZYLOPRIM) 300 MG tablet Take 1 tablet (300 mg total) by mouth daily.  Marland Kitchen aspirin 81 MG tablet Take 1 tablet (81 mg total) by mouth daily.  Marland Kitchen atorvastatin (LIPITOR) 40 MG tablet TAKE 1 TABLET DAILY  . Cholecalciferol (VITAMIN D) 2000 UNITS tablet Take 2,000 Units by mouth daily.  . citalopram (CELEXA) 20 MG tablet Take 20 mg by mouth daily.  . colchicine 0.6 MG tablet Take 1 tablet (0.6 mg total) by mouth daily.  Marland Kitchen  docusate sodium (COLACE) 100 MG capsule Take 100 mg by mouth daily.  Marland Kitchen donepezil (ARICEPT) 10 MG tablet TAKE 1 TABLET DAILY IN THE EVENING  . ezetimibe (ZETIA) 10 MG tablet TAKE 1 TABLET AT BEDTIME  . FOLBIC 2.5-25-2 MG TABS tablet TAKE 1 TABLET DAILY  . furosemide (LASIX) 40 MG tablet TAKE 1 TABLET TWICE A DAY  . hydrALAZINE (APRESOLINE) 25 MG tablet TAKE 1 TABLET THREE TIMES A DAY  . Insulin Glargine (LANTUS SOLOSTAR) 100 UNIT/ML Solostar Pen Inject 12 Units into the skin at bedtime.  . Insulin Pen Needle (BD PEN NEEDLE MICRO U/F) 32G X 6 MM MISC Use as directed  . isosorbide  mononitrate (IMDUR) 30 MG 24 hr tablet Take 1 tablet (30 mg total) by mouth daily.  Marland Kitchen loratadine (CLARITIN) 10 MG tablet Take 10 mg by mouth every morning.  . Magnesium 250 MG TABS Take by mouth at bedtime.  . metolazone (ZAROXOLYN) 2.5 MG tablet TAKE 1 TABLET EVERY OTHER DAY 30 MINUTES PRIOR TO YOUR MORNING LASIX DOSE  . metoprolol succinate (TOPROL-XL) 25 MG 24 hr tablet TAKE 1 TABLET(25 MG) BY MOUTH DAILY  . telmisartan (MICARDIS) 80 MG tablet TAKE 1 TABLET DAILY     Allergies:   Ace inhibitors and Valsartan   Social History   Tobacco Use  . Smoking status: Never Smoker  . Smokeless tobacco: Never Used  Substance Use Topics  . Alcohol use: No  . Drug use: No     Family Hx: The patient's family history includes Healthy in her mother; Prostate cancer in her father and another family member.  ROS:   Please see the history of present illness.    All other systems reviewed and are negative.   Prior CV studies:   The following studies were reviewed today:   Labs/Other Tests and Data Reviewed:    EKG:  No ECG reviewed.  Recent Labs: 06/03/2019: Magnesium 1.5; TSH 2.740 06/11/2019: ALT 29; BUN 33; Creat 1.56; Hemoglobin 9.2; Platelets 218; Potassium 3.9; Sodium 139   Recent Lipid Panel Lab Results  Component Value Date/Time   CHOL 137 03/03/2019 02:43 PM   TRIG 69 03/03/2019 02:43 PM   HDL 63 03/03/2019 02:43 PM   CHOLHDL 2.2 03/03/2019 02:43 PM   LDLCALC 60 03/03/2019 02:43 PM    Wt Readings from Last 3 Encounters:  08/04/19 154 lb (69.9 kg)  07/10/19 160 lb 9.6 oz (72.8 kg)  07/02/19 158 lb 9.6 oz (71.9 kg)     Objective:    Vital Signs:  BP (!) 148/76   Pulse 60   Ht 4\' 11"  (1.499 m)   Wt 154 lb (69.9 kg)   BMI 31.10 kg/m    VITAL SIGNS:  reviewed  ASSESSMENT & PLAN:    Palpitations ZIO results pending. The addition of Toprol 25 mg improved her symptoms.  Hypertensive cardiovascular disease LVH, DD, normal LVF on echo  Obstructive sleep apnea  on CPAP She has not been on C-pap secondary to complications  Chest pain History of chest pain with normal coronaries Feb 2012  Insulin dependent type 2 diabetes mellitus ([place]) Per PCP  COVID-19 Education: The signs and symptoms of COVID-19 were discussed with the patient and how to seek care for testing (follow up with PCP or arrange E-visit).  The importance of social distancing was discussed today.  Time:   Today, I have spent 15 minutes with the patient with telehealth technology discussing the above problems.     Medication Adjustments/Labs and  Tests Ordered: Current medicines are reviewed at length with the patient today.  Concerns regarding medicines are outlined above.   Tests Ordered: No orders of the defined types were placed in this encounter.   Medication Changes: No orders of the defined types were placed in this encounter.   Follow Up:  In Person May with Dr Gwenlyn Found or myself.   Signed, Kerin Ransom, PA-C  08/04/2019 10:22 AM    [place]

## 2019-08-04 NOTE — Telephone Encounter (Signed)
-----   Message from Erlene Quan, [place] sent at 08/04/2019  2:17 PM EST ----- Regarding: RE: Zio status No, she is doing better on the Toprol.  Can you just tell her what happened.    Kerin Ransom PA-C 08/04/2019 2:17 PM   ----- Message ----- From: Harold Hedge, CMA Sent: 08/04/2019   1:59 PM EST To: Erlene Quan, PA-C Subject: FW: Zio status                                 Hey Luke,   Do you want to reorder the monitor?   Tee ----- Message ----- From: Jennefer Bravo Sent: 08/04/2019  12:18 PM EST To: Harold Hedge, CMA Subject: RE: Elwyn Reach status                                 08/04/19 [employer] called.  Patient returned monitor to [facility].  Monitor appeared to have been applied but never started.  No data available.  If they want to repeat, I would specify on order to apply in office since instructions were not followed. Thanks,  Darrick Penna ----- Message ----- From: Harold Hedge, CMA Sent: 08/04/2019  10:32 AM EST To: Erlene Quan, PA-C, Jennefer Bravo Subject: Zio status                                     Hello,   This patient had a monitor last month and Lurena Joiner wanted to know the status of the results. Can you please look into this and get back to [name].  Thanks in advance, [place]

## 2019-08-04 NOTE — Progress Notes (Signed)
Patient ID: Maria [name], female   DOB: 1926/03/12, 85 y.o.   MRN: [id number] [employer] called regarding ZIO patch monitor mailed to patients home 06/09/19.  Monitor had been returned to [facility].  Monitor appeared to have been applied but never started.  If provider chooses to reorder monitor, please specify to have patient scheduled to apply in office since instructions on mailed monitor were not followed.

## 2019-08-04 NOTE — Telephone Encounter (Signed)
Patient notified directly and states she doesn't know why her monitor did not record anything because her CNA put it on her and she knew what she was doing.   I told her Lurena Joiner stated she did not need to wear another monitor. Patient thanked me for calling and letting her know what was going on.

## 2019-08-04 NOTE — Telephone Encounter (Signed)
Contacted patient to discuss AVS Instructions. Gave patient Luke's recommendations from today's virtual office visit. Informed patient that someone from the scheduling dept will be in contact with them to schedule her follow up appt. Patient voiced understanding and AVS mailed.    

## 2019-08-04 NOTE — Patient Instructions (Signed)
Medication Instructions:  Your physician recommends that you continue on your current medications as directed. Please refer to the Current Medication list given to you today. *If you need a refill on your cardiac medications before your next appointment, please call your pharmacy*  Lab Work: None   If you have labs (blood work) drawn today and your tests are completely normal, you will receive your results only by: Marland Kitchen MyChart Message (if you have MyChart) OR . A paper copy in the mail If you have any lab test that is abnormal or we need to change your treatment, we will call you to review the results.  Testing/Procedures: None  Follow-Up: At [facility], you and your health needs are our priority.  As part of our continuing mission to provide you with exceptional heart care, we have created designated Provider Care Teams.  These Care Teams include your primary Cardiologist (physician) and Advanced Practice Providers (APPs -  Physician Assistants and Nurse Practitioners) who all work together to provide you with the care you need, when you need it.  Your next appointment:   6 month(s)  The format for your next appointment:   In Person  Provider:   You may see Quay Burow, MD or one of the following Advanced Practice Providers on your designated Care Team:    Kerin Ransom, PA-C  [place], [place]  Coletta Memos, [place]   Other Instructions

## 2019-08-05 DIAGNOSIS — Z794 Long term (current) use of insulin: Secondary | ICD-10-CM | POA: Diagnosis not present

## 2019-08-05 DIAGNOSIS — I5032 Chronic diastolic (congestive) heart failure: Secondary | ICD-10-CM | POA: Diagnosis not present

## 2019-08-05 DIAGNOSIS — G4733 Obstructive sleep apnea (adult) (pediatric): Secondary | ICD-10-CM | POA: Diagnosis not present

## 2019-08-05 DIAGNOSIS — E785 Hyperlipidemia, unspecified: Secondary | ICD-10-CM | POA: Diagnosis not present

## 2019-08-05 DIAGNOSIS — Z7982 Long term (current) use of aspirin: Secondary | ICD-10-CM | POA: Diagnosis not present

## 2019-08-05 DIAGNOSIS — M109 Gout, unspecified: Secondary | ICD-10-CM | POA: Diagnosis not present

## 2019-08-05 DIAGNOSIS — E559 Vitamin D deficiency, unspecified: Secondary | ICD-10-CM | POA: Diagnosis not present

## 2019-08-05 DIAGNOSIS — E782 Mixed hyperlipidemia: Secondary | ICD-10-CM | POA: Diagnosis not present

## 2019-08-05 DIAGNOSIS — E1122 Type 2 diabetes mellitus with diabetic chronic kidney disease: Secondary | ICD-10-CM | POA: Diagnosis not present

## 2019-08-05 DIAGNOSIS — I130 Hypertensive heart and chronic kidney disease with heart failure and stage 1 through stage 4 chronic kidney disease, or unspecified chronic kidney disease: Secondary | ICD-10-CM | POA: Diagnosis not present

## 2019-08-05 DIAGNOSIS — K5730 Diverticulosis of large intestine without perforation or abscess without bleeding: Secondary | ICD-10-CM | POA: Diagnosis not present

## 2019-08-05 DIAGNOSIS — Z9181 History of falling: Secondary | ICD-10-CM | POA: Diagnosis not present

## 2019-08-05 DIAGNOSIS — Z8673 Personal history of transient ischemic attack (TIA), and cerebral infarction without residual deficits: Secondary | ICD-10-CM | POA: Diagnosis not present

## 2019-08-05 DIAGNOSIS — M1990 Unspecified osteoarthritis, unspecified site: Secondary | ICD-10-CM | POA: Diagnosis not present

## 2019-08-05 DIAGNOSIS — N183 Chronic kidney disease, stage 3 unspecified: Secondary | ICD-10-CM | POA: Diagnosis not present

## 2019-08-07 DIAGNOSIS — N183 Chronic kidney disease, stage 3 unspecified: Secondary | ICD-10-CM | POA: Diagnosis not present

## 2019-08-07 DIAGNOSIS — Z794 Long term (current) use of insulin: Secondary | ICD-10-CM | POA: Diagnosis not present

## 2019-08-07 DIAGNOSIS — Z8673 Personal history of transient ischemic attack (TIA), and cerebral infarction without residual deficits: Secondary | ICD-10-CM | POA: Diagnosis not present

## 2019-08-07 DIAGNOSIS — E559 Vitamin D deficiency, unspecified: Secondary | ICD-10-CM | POA: Diagnosis not present

## 2019-08-07 DIAGNOSIS — E782 Mixed hyperlipidemia: Secondary | ICD-10-CM | POA: Diagnosis not present

## 2019-08-07 DIAGNOSIS — G4733 Obstructive sleep apnea (adult) (pediatric): Secondary | ICD-10-CM | POA: Diagnosis not present

## 2019-08-07 DIAGNOSIS — E785 Hyperlipidemia, unspecified: Secondary | ICD-10-CM | POA: Diagnosis not present

## 2019-08-07 DIAGNOSIS — Z9181 History of falling: Secondary | ICD-10-CM | POA: Diagnosis not present

## 2019-08-07 DIAGNOSIS — K5730 Diverticulosis of large intestine without perforation or abscess without bleeding: Secondary | ICD-10-CM | POA: Diagnosis not present

## 2019-08-07 DIAGNOSIS — M1990 Unspecified osteoarthritis, unspecified site: Secondary | ICD-10-CM | POA: Diagnosis not present

## 2019-08-07 DIAGNOSIS — E1122 Type 2 diabetes mellitus with diabetic chronic kidney disease: Secondary | ICD-10-CM | POA: Diagnosis not present

## 2019-08-07 DIAGNOSIS — I130 Hypertensive heart and chronic kidney disease with heart failure and stage 1 through stage 4 chronic kidney disease, or unspecified chronic kidney disease: Secondary | ICD-10-CM | POA: Diagnosis not present

## 2019-08-07 DIAGNOSIS — Z7982 Long term (current) use of aspirin: Secondary | ICD-10-CM | POA: Diagnosis not present

## 2019-08-07 DIAGNOSIS — I5032 Chronic diastolic (congestive) heart failure: Secondary | ICD-10-CM | POA: Diagnosis not present

## 2019-08-07 DIAGNOSIS — M109 Gout, unspecified: Secondary | ICD-10-CM | POA: Diagnosis not present

## 2019-08-10 ENCOUNTER — Telehealth: Payer: Self-pay

## 2019-08-10 DIAGNOSIS — I5032 Chronic diastolic (congestive) heart failure: Secondary | ICD-10-CM | POA: Diagnosis not present

## 2019-08-10 DIAGNOSIS — M1990 Unspecified osteoarthritis, unspecified site: Secondary | ICD-10-CM | POA: Diagnosis not present

## 2019-08-10 DIAGNOSIS — Z794 Long term (current) use of insulin: Secondary | ICD-10-CM | POA: Diagnosis not present

## 2019-08-10 DIAGNOSIS — E782 Mixed hyperlipidemia: Secondary | ICD-10-CM | POA: Diagnosis not present

## 2019-08-10 DIAGNOSIS — E559 Vitamin D deficiency, unspecified: Secondary | ICD-10-CM | POA: Diagnosis not present

## 2019-08-10 DIAGNOSIS — I130 Hypertensive heart and chronic kidney disease with heart failure and stage 1 through stage 4 chronic kidney disease, or unspecified chronic kidney disease: Secondary | ICD-10-CM | POA: Diagnosis not present

## 2019-08-10 DIAGNOSIS — E785 Hyperlipidemia, unspecified: Secondary | ICD-10-CM | POA: Diagnosis not present

## 2019-08-10 DIAGNOSIS — Z9181 History of falling: Secondary | ICD-10-CM | POA: Diagnosis not present

## 2019-08-10 DIAGNOSIS — E1122 Type 2 diabetes mellitus with diabetic chronic kidney disease: Secondary | ICD-10-CM | POA: Diagnosis not present

## 2019-08-10 DIAGNOSIS — G4733 Obstructive sleep apnea (adult) (pediatric): Secondary | ICD-10-CM | POA: Diagnosis not present

## 2019-08-10 DIAGNOSIS — K5730 Diverticulosis of large intestine without perforation or abscess without bleeding: Secondary | ICD-10-CM | POA: Diagnosis not present

## 2019-08-10 DIAGNOSIS — Z8673 Personal history of transient ischemic attack (TIA), and cerebral infarction without residual deficits: Secondary | ICD-10-CM | POA: Diagnosis not present

## 2019-08-10 DIAGNOSIS — Z7982 Long term (current) use of aspirin: Secondary | ICD-10-CM | POA: Diagnosis not present

## 2019-08-10 DIAGNOSIS — N183 Chronic kidney disease, stage 3 unspecified: Secondary | ICD-10-CM | POA: Diagnosis not present

## 2019-08-10 DIAGNOSIS — M109 Gout, unspecified: Secondary | ICD-10-CM | POA: Diagnosis not present

## 2019-08-11 DIAGNOSIS — Z8673 Personal history of transient ischemic attack (TIA), and cerebral infarction without residual deficits: Secondary | ICD-10-CM

## 2019-08-11 DIAGNOSIS — E782 Mixed hyperlipidemia: Secondary | ICD-10-CM

## 2019-08-11 DIAGNOSIS — K5730 Diverticulosis of large intestine without perforation or abscess without bleeding: Secondary | ICD-10-CM

## 2019-08-11 DIAGNOSIS — E785 Hyperlipidemia, unspecified: Secondary | ICD-10-CM

## 2019-08-11 DIAGNOSIS — E1122 Type 2 diabetes mellitus with diabetic chronic kidney disease: Secondary | ICD-10-CM | POA: Diagnosis not present

## 2019-08-11 DIAGNOSIS — Z7982 Long term (current) use of aspirin: Secondary | ICD-10-CM

## 2019-08-11 DIAGNOSIS — F419 Anxiety disorder, unspecified: Secondary | ICD-10-CM

## 2019-08-11 DIAGNOSIS — M109 Gout, unspecified: Secondary | ICD-10-CM

## 2019-08-11 DIAGNOSIS — I130 Hypertensive heart and chronic kidney disease with heart failure and stage 1 through stage 4 chronic kidney disease, or unspecified chronic kidney disease: Secondary | ICD-10-CM | POA: Diagnosis not present

## 2019-08-11 DIAGNOSIS — E559 Vitamin D deficiency, unspecified: Secondary | ICD-10-CM

## 2019-08-11 DIAGNOSIS — Z794 Long term (current) use of insulin: Secondary | ICD-10-CM

## 2019-08-11 DIAGNOSIS — G4733 Obstructive sleep apnea (adult) (pediatric): Secondary | ICD-10-CM

## 2019-08-11 DIAGNOSIS — I5032 Chronic diastolic (congestive) heart failure: Secondary | ICD-10-CM | POA: Diagnosis not present

## 2019-08-11 DIAGNOSIS — N183 Chronic kidney disease, stage 3 unspecified: Secondary | ICD-10-CM | POA: Diagnosis not present

## 2019-08-11 DIAGNOSIS — M1990 Unspecified osteoarthritis, unspecified site: Secondary | ICD-10-CM | POA: Diagnosis not present

## 2019-08-12 ENCOUNTER — Ambulatory Visit: Payer: Medicare Other | Admitting: Pharmacist

## 2019-08-12 DIAGNOSIS — E785 Hyperlipidemia, unspecified: Secondary | ICD-10-CM | POA: Diagnosis not present

## 2019-08-12 DIAGNOSIS — Z9181 History of falling: Secondary | ICD-10-CM | POA: Diagnosis not present

## 2019-08-12 DIAGNOSIS — E1122 Type 2 diabetes mellitus with diabetic chronic kidney disease: Secondary | ICD-10-CM

## 2019-08-12 DIAGNOSIS — N183 Chronic kidney disease, stage 3 unspecified: Secondary | ICD-10-CM | POA: Diagnosis not present

## 2019-08-12 DIAGNOSIS — E559 Vitamin D deficiency, unspecified: Secondary | ICD-10-CM | POA: Diagnosis not present

## 2019-08-12 DIAGNOSIS — M1990 Unspecified osteoarthritis, unspecified site: Secondary | ICD-10-CM | POA: Diagnosis not present

## 2019-08-12 DIAGNOSIS — Z7982 Long term (current) use of aspirin: Secondary | ICD-10-CM | POA: Diagnosis not present

## 2019-08-12 DIAGNOSIS — Z8673 Personal history of transient ischemic attack (TIA), and cerebral infarction without residual deficits: Secondary | ICD-10-CM | POA: Diagnosis not present

## 2019-08-12 DIAGNOSIS — M109 Gout, unspecified: Secondary | ICD-10-CM | POA: Diagnosis not present

## 2019-08-12 DIAGNOSIS — K5730 Diverticulosis of large intestine without perforation or abscess without bleeding: Secondary | ICD-10-CM | POA: Diagnosis not present

## 2019-08-12 DIAGNOSIS — Z794 Long term (current) use of insulin: Secondary | ICD-10-CM | POA: Diagnosis not present

## 2019-08-12 DIAGNOSIS — I5032 Chronic diastolic (congestive) heart failure: Secondary | ICD-10-CM | POA: Diagnosis not present

## 2019-08-12 DIAGNOSIS — M10341 Gout due to renal impairment, right hand: Secondary | ICD-10-CM

## 2019-08-12 DIAGNOSIS — E782 Mixed hyperlipidemia: Secondary | ICD-10-CM | POA: Diagnosis not present

## 2019-08-12 DIAGNOSIS — G4733 Obstructive sleep apnea (adult) (pediatric): Secondary | ICD-10-CM | POA: Diagnosis not present

## 2019-08-12 DIAGNOSIS — I130 Hypertensive heart and chronic kidney disease with heart failure and stage 1 through stage 4 chronic kidney disease, or unspecified chronic kidney disease: Secondary | ICD-10-CM | POA: Diagnosis not present

## 2019-08-12 NOTE — Patient Instructions (Signed)
Visit Information  Goals Addressed            This Visit's Progress     Patient Stated   . I would like to manage my chronic conditions (pt-stated)       A. DM  i. Patient states she has concerns regarding checking her BG, specifically about obtaining enough blood and feeling that BG lancet injections are painful. She inquires about possibility of using [employer].  ii. BG readings: 90s; only goes above 100 when she "overindulges"  iii. Hypoglycemia: No BG <70; no s/sx of hypoglycemia  iv. Diet: 2 meals (breakfast, dinner) 2 snacks (lunch, after dinner)   1. Breakfast: cereal, sausage or [place] bacon, banana  2. Dinner: greens, turkey/chicken  3. Snacks: graham crackers, chicken, potato salad, macaroni, peanut butter/jelly sandwich  4. Drinks: cherry juice, coffee, hot chocolate, water, milk  5. Exercise: numbness in left leg, tries to walk up the hallway at [facility], uses roller chair, not walking frequently b/c she almost passed out   vi. HbA1c (06/03/19): 6.9  vii. EGFR (06/11/19): 33  viii. ASCVD risk: cannot calculate (age)  ix. Current meds: insulin glargine (Lantus) 12 units QHS   1. Affordability: Pt receives all meds through [address] for a $0 copay except for allopurinol and colchicine which she receives through [employer]  x. Prior meds: N/A  xi. ACEi/ARB, ASA81, statin? yes, yes, yes  xii. Foot exam: N/A  xiii. Eye exam: retinopathy (01/2019)  xiv. HBA1c goal <8% (FPG 90-150; Bedtime 100-180)   xv. Plan:  1. Continue insulin glargine (Lantus) 12 units QHS  2.Informed patient that Freestyle Elenor Legato is not covered via insurance and the cash price ~$130 for transmitter and $90 for 30 day supply of sensor. Patient would prefer to check BG manually with BG meter, BG test strips, and lancing device at this time. Plan for patient to check fasting blood glucose every other day or every 2-3 days considering patient's current pain level with checking blood glucose and  controlled HbA1c.   B. Gout  - Patient states she is currently taking allopurinol 300 mg daily and colchicine 0.6 mg sporadically (rather than onset of gout flare per instructions at last rheumatology appt) "to prevent gout attacks". Her uric acid level on 06/11/2019 was 5.5 and she has not had a recent gout attack.  -CrCl 20 mL/min -eGFR 33 (06/11/2019) Plan: 1. Recommend consideration of dose reduction of allopurinol to 200 mg daily 2. Recommend consideration of educating patient to take colchicine 0.6 at onset of gout flare (can take an additional 0.6 mg (total = 1.2 mg) if she does not find initial 0.6 mg beneficial) and to not repeat within 2 weeks.  C. Pain -Patient has been seeing occupational therapy. During her management, she has used Voltaren gel 1% to manage pain on her hands and finds this beneficial. She was wondering if possible she could obtain a prescription for further use of Voltaren gel 1%. Plan: 1. Defer voltaren gel 1% prescription to expertise of rheumatology      Initial goal documentation        The patient verbalized understanding of instructions provided today and declined a print copy of patient instruction materials.   The care management team will reach out to the patient again over the next 30 days.   Drexel Iha, PharmD PGY2 Ambulatory Care Pharmacy Resident  Regina Eck, PharmD, [place] Clinical Pharmacist, [facility] [place]:  [phone]   

## 2019-08-12 NOTE — Progress Notes (Signed)
Chronic Care Management   Visit Note  08/12/2019 Name: Maria [name] MRN: [id number] DOB: June 15, 1926  Referred by: Glendale Chard, MD Reason for referral : Chronic Care Management   Maria [name] is a 85 y.o. year old female who is a primary care patient of Glendale Chard, MD. The CCM team was consulted for assistance with chronic disease management and care coordination needs related to DMII and gout.  Review of patient status, including review of consultants reports, relevant laboratory and other test results, and collaboration with appropriate care team members and the patient's provider was performed as part of comprehensive patient evaluation and provision of chronic care management services.    Patient was contacted via telephone for follow up appt.  Medications: Outpatient Encounter Medications as of 08/12/2019  Medication Sig  . Accu-Chek FastClix Lancets MISC CHECK BLOOD SUGAR BEFORE BREAKFAST AND DINNER  . ACCU-CHEK SMARTVIEW test strip U TO CHECK BID BEFORE BRE AND DINNER dx: e11.65  . allopurinol (ZYLOPRIM) 300 MG tablet Take 1 tablet (300 mg total) by mouth daily.  Marland Kitchen aspirin 81 MG tablet Take 1 tablet (81 mg total) by mouth daily.  Marland Kitchen atorvastatin (LIPITOR) 40 MG tablet TAKE 1 TABLET DAILY  . Cholecalciferol (VITAMIN D) 2000 UNITS tablet Take 2,000 Units by mouth daily.  . citalopram (CELEXA) 20 MG tablet Take 20 mg by mouth daily.  . colchicine 0.6 MG tablet Take 1 tablet (0.6 mg total) by mouth daily.  Marland Kitchen docusate sodium (COLACE) 100 MG capsule Take 100 mg by mouth daily.  Marland Kitchen donepezil (ARICEPT) 10 MG tablet TAKE 1 TABLET DAILY IN THE EVENING  . ezetimibe (ZETIA) 10 MG tablet TAKE 1 TABLET AT BEDTIME  . FOLBIC 2.5-25-2 MG TABS tablet TAKE 1 TABLET DAILY  . furosemide (LASIX) 40 MG tablet TAKE 1 TABLET TWICE A DAY  . hydrALAZINE (APRESOLINE) 25 MG tablet TAKE 1 TABLET THREE TIMES A DAY  . Insulin Glargine (LANTUS SOLOSTAR) 100 UNIT/ML Solostar Pen Inject 12 Units into  the skin at bedtime.  . Insulin Pen Needle (BD PEN NEEDLE MICRO U/F) 32G X 6 MM MISC Use as directed  . isosorbide mononitrate (IMDUR) 30 MG 24 hr tablet Take 1 tablet (30 mg total) by mouth daily.  Marland Kitchen loratadine (CLARITIN) 10 MG tablet Take 10 mg by mouth every morning.  . Magnesium 250 MG TABS Take by mouth at bedtime.  . metolazone (ZAROXOLYN) 2.5 MG tablet TAKE 1 TABLET EVERY OTHER DAY 30 MINUTES PRIOR TO YOUR MORNING LASIX DOSE  . metoprolol succinate (TOPROL-XL) 25 MG 24 hr tablet TAKE 1 TABLET(25 MG) BY MOUTH DAILY  . telmisartan (MICARDIS) 80 MG tablet TAKE 1 TABLET DAILY  . [DISCONTINUED] clonazePAM (KLONOPIN) 0.5 MG tablet Take 0.5 mg by mouth at bedtime.   No facility-administered encounter medications on file as of 08/12/2019.      Objective:   Goals Addressed            This Visit's Progress     Patient Stated   . I would like to manage my chronic conditions (pt-stated)       A. DM  i. Patient states she has concerns regarding checking her BG, specifically about obtaining enough blood and feeling that BG lancet injections are painful. She inquires about possibility of using [employer].  ii. BG readings: 90s; only goes above 100 when she "overindulges"  iii. Hypoglycemia: No BG <70; no s/sx of hypoglycemia  iv. Diet: 2 meals (breakfast, dinner) 2 snacks (lunch, after dinner)  1. Breakfast: cereal, sausage or [place] bacon, banana  2. Dinner: greens, turkey/chicken  3. Snacks: graham crackers, chicken, potato salad, macaroni, peanut butter/jelly sandwich  4. Drinks: cherry juice, coffee, hot chocolate, water, milk  5. Exercise: numbness in left leg, tries to walk up the hallway at [facility], uses roller chair, not walking frequently b/c she almost passed out   vi. HbA1c (06/03/19): 6.9  vii. EGFR (06/11/19): 33  viii. ASCVD risk: cannot calculate (age)  ix. Current meds: insulin glargine (Lantus) 12 units QHS   1. Affordability: Pt receives all meds through [facility]  for a $0 copay except for allopurinol and colchicine which she receives through [employer]  x. Prior meds: N/A  xi. ACEi/ARB, ASA81, statin? yes, yes, yes  xii. Foot exam: N/A  xiii. Eye exam: retinopathy (01/2019)  xiv. HBA1c goal <8% (FPG 90-150; Bedtime 100-180)   xv. Plan:  1. Continue insulin glargine (Lantus) 12 units QHS  2. Inform patient that [employer] cost ~$70 for 2 week    B. Gout  - Patient states she is currently taking allopurinol 300 mg daily and colchicine 0.6 mg sporadically (rather than onset of gout flare per instructions at last rheumatology appt) "to prevent gout attacks". Her uric acid level on 06/11/2019 was 5.5 and she has not had a recent gout attack.  -CrCl 20 mL/min -eGFR 33 (06/11/2019) Plan: 1. Recommend consideration of dose reduction of allopurinol to 200 mg daily 2. Recommend consideration of educating patient to take colchicine 0.6 at onset of gout flare (can take an additional 0.6 mg (total = 1.2 mg) if she does not find initial 0.6 mg beneficial) and to not repeat within 2 weeks.  C. Pain -Patient has been seeing occupational therapy. During her management, she has used Voltaren gel 1% to manage pain on her hands and finds this beneficial. She was wondering if possible she could obtain a prescription for further use of Voltaren gel 1%. Plan: 1. Defer voltaren gel 1% prescription to expertise of rheumatology      Initial goal documentation         [name] was given information about Chronic Care Management services today including:  1. CCM service includes personalized support from designated clinical staff supervised by her physician, including individualized plan of care and coordination with other care providers 2. 24/7 contact phone numbers for assistance for urgent and routine care needs. 3. Service will only be billed when office clinical staff spend 20 minutes or more in a month to coordinate care. 4. Only one practitioner  may furnish and bill the service in a calendar month. 5. The patient may stop CCM services at any time (effective at the end of the month) by phone call to the office staff. 6. The patient will be responsible for cost sharing (co-pay) of up to 20% of the service fee (after annual deductible is met).    Plan:   The care management team will reach out to the patient again over the next 30 days.   Drexel Iha, PharmD PGY2 Ambulatory Care Pharmacy Resident  Regina Eck, PharmD, BCPS Clinical Pharmacist, [facility] [place]: [phone]

## 2019-08-13 ENCOUNTER — Ambulatory Visit (INDEPENDENT_AMBULATORY_CARE_PROVIDER_SITE_OTHER): Payer: Medicare Other

## 2019-08-13 ENCOUNTER — Other Ambulatory Visit: Payer: Self-pay

## 2019-08-13 DIAGNOSIS — N183 Chronic kidney disease, stage 3 unspecified: Secondary | ICD-10-CM

## 2019-08-13 DIAGNOSIS — Z794 Long term (current) use of insulin: Secondary | ICD-10-CM

## 2019-08-13 DIAGNOSIS — I5033 Acute on chronic diastolic (congestive) heart failure: Secondary | ICD-10-CM

## 2019-08-13 DIAGNOSIS — E1122 Type 2 diabetes mellitus with diabetic chronic kidney disease: Secondary | ICD-10-CM | POA: Diagnosis not present

## 2019-08-13 DIAGNOSIS — R002 Palpitations: Secondary | ICD-10-CM

## 2019-08-13 MED ORDER — DICLOFENAC SODIUM 1 % EX GEL: 2.0000 g | Freq: Four times a day (QID) | CUTANEOUS | 2 refills | Status: DC

## 2019-08-13 NOTE — Patient Instructions (Signed)
Social Worker Visit Information  Goals we discussed today:  Goals Addressed            This Visit's Progress     Patient Stated   . "I have Congestive Heart Failue" (pt-stated)   On track    Current Barriers:  Marland Kitchen Knowledge Deficits related to Self health management for CHF  Nurse Case Manager Clinical Goal(s):  Marland Kitchen Over the next 30 days, patient will work with the CCM RN to address needs related to CHF . New 10/192020: Over the next 30 days the patient will work with CCM RN to understand the importance of daily weights  CCM SW Interventions: Completed 08/13/2019 . Outbound call to the patient to assist with care coordination of patient goal . Reviewed previous interventions and assessed for ongoing or worsening heart palpitation symptoms . Determined the patient has yet to experience heart palpitations upon recent medication change "ever since I stopped taking the half pill and started the whole pill I have not noticed them" . Encouraged the patient to continue taking medication as directed to better manage her ongoing heart failure . Collaboration with embedded RN Case Manager regarding today's intervention  Patient Self Care Activities:  . Self administers medications as prescribed . Attends all scheduled provider appointments . Calls pharmacy for medication refills . Attends church or other social activities . Performs ADL's independently . Performs IADL's independently . Calls provider office for new concerns or questions  Please see past updates related to this goal by clicking on the "Past Updates" button in the selected goal      . "I just feel weak" (pt-stated)   On track    Current Barriers:  Marland Kitchen Knowledge Deficits related to acute onset of "weakness"   Nurse Case Manager Clinical Goal(s):  Marland Kitchen Over the next 14 days, patient will report the acute onset of weakness has resolved as evidence by patient will have increased energy and will be able to complete her daily  activities w/o difficulty or fatigue. GOAL MET . Over the next 30 days, patient will not experience ED visits and or IP admission.   CCM SW Interventions Completed 08/13/2019 . Outbound call to the patient to review progression of patient goal . Determined the patient continues to work with PT and OT through home health o "I still feel a little weak but it is getting better. My appetite is improving" . Discussed the importance of the patient to continue drinking water and eating a balanced diet. The patient endorses consistent use of all prescribed medications . Encouraged the patient to contact her provider with any future health concenrs  Patient Self Care Activities:  . Self administers medications as prescribed . Attends all scheduled provider appointments . Calls pharmacy for medication refills . Performs ADL's independently . Performs IADL's independently . Calls provider office for new concerns or questions   Please see past updates related to this goal by clicking on the "Past Updates" button in the selected goal        Other   . Assist with care coordination by conducting social determinants of health screen       Current Barriers:  Marland Kitchen Knowledge Barriers related to resources and support available to address needs related to Chronic Care Management and challenges surrounding Social Determinants of Health  Clinical Social Work Clinical Goal(s):   Over the next 20 days, the patient will understand the role of the CCM team and work with SW to complete SDOH (Social Determinants of  Health) screen. Completed  New 06/29/2019 Over the next 45 days the patient will work with SW to review SDOH and follow up with identified resource needs  CCM SW Interventions: Completed 08/13/2019  Outbound call placed to review SDOH screening tool and assist with any current challenges  No challenges identified during today's encounter  Reviewed patient decision to decline referral to meals on  wheels program at this time due to continued support from her neighbors providing meals  Encouraged the patient to eat regular meals to assist with diabetes management  Discussed plan to contact the patient over the next 60 days to assist with care coordination needs  Patient Self Care Activities:   Calls provider office with new concerns  Performs ADL's independently  See past updates        Follow Up Plan: SW will follow up with patient by phone over the next month   Daneen Schick, BSW, CDP Social Worker, Certified Dementia Practitioner [place] [facility] [phone]

## 2019-08-13 NOTE — Chronic Care Management (AMB) (Signed)
Chronic Care Management    Social Work Follow Up Note  08/13/2019 Name: Maria [name] MRN: [id number] DOB: 1926-06-04  Maria [name] is a 85 y.o. year old female who is a primary care patient of Glendale Chard, MD. The CCM team was consulted for assistance with care coordination.   Review of patient status, including review of consultants reports, other relevant assessments, and collaboration with appropriate care team members and the patient's provider was performed as part of comprehensive patient evaluation and provision of chronic care management services.    SW placed an outbound call to the patient to review progression of patient stated goal and assist with care coordination.  Outpatient Encounter Medications as of 08/13/2019  Medication Sig  . Accu-Chek FastClix Lancets MISC CHECK BLOOD SUGAR BEFORE BREAKFAST AND DINNER  . ACCU-CHEK SMARTVIEW test strip U TO CHECK BID BEFORE BRE AND DINNER dx: e11.65  . allopurinol (ZYLOPRIM) 300 MG tablet Take 1 tablet (300 mg total) by mouth daily.  Marland Kitchen aspirin 81 MG tablet Take 1 tablet (81 mg total) by mouth daily.  Marland Kitchen atorvastatin (LIPITOR) 40 MG tablet TAKE 1 TABLET DAILY  . Cholecalciferol (VITAMIN D) 2000 UNITS tablet Take 2,000 Units by mouth daily.  . citalopram (CELEXA) 20 MG tablet Take 20 mg by mouth daily.  . colchicine 0.6 MG tablet Take 1 tablet (0.6 mg total) by mouth daily.  Marland Kitchen docusate sodium (COLACE) 100 MG capsule Take 100 mg by mouth daily.  Marland Kitchen donepezil (ARICEPT) 10 MG tablet TAKE 1 TABLET DAILY IN THE EVENING  . ezetimibe (ZETIA) 10 MG tablet TAKE 1 TABLET AT BEDTIME  . FOLBIC 2.5-25-2 MG TABS tablet TAKE 1 TABLET DAILY  . furosemide (LASIX) 40 MG tablet TAKE 1 TABLET TWICE A DAY  . hydrALAZINE (APRESOLINE) 25 MG tablet TAKE 1 TABLET THREE TIMES A DAY  . Insulin Glargine (LANTUS SOLOSTAR) 100 UNIT/ML Solostar Pen Inject 12 Units into the skin at bedtime.  . Insulin Pen Needle (BD PEN NEEDLE MICRO U/F) 32G X 6 MM MISC Use as  directed  . isosorbide mononitrate (IMDUR) 30 MG 24 hr tablet Take 1 tablet (30 mg total) by mouth daily.  Marland Kitchen loratadine (CLARITIN) 10 MG tablet Take 10 mg by mouth every morning.  . Magnesium 250 MG TABS Take by mouth at bedtime.  . metolazone (ZAROXOLYN) 2.5 MG tablet TAKE 1 TABLET EVERY OTHER DAY 30 MINUTES PRIOR TO YOUR MORNING LASIX DOSE  . metoprolol succinate (TOPROL-XL) 25 MG 24 hr tablet TAKE 1 TABLET(25 MG) BY MOUTH DAILY  . telmisartan (MICARDIS) 80 MG tablet TAKE 1 TABLET DAILY  . [DISCONTINUED] clonazePAM (KLONOPIN) 0.5 MG tablet Take 0.5 mg by mouth at bedtime.   No facility-administered encounter medications on file as of 08/13/2019.      Goals Addressed            This Visit's Progress     Patient Stated   . "I have Congestive Heart Failue" (pt-stated)   On track    Current Barriers:  Marland Kitchen Knowledge Deficits related to Self health management for CHF  Nurse Case Manager Clinical Goal(s):  Marland Kitchen Over the next 30 days, patient will work with the CCM RN to address needs related to CHF . New 10/192020: Over the next 30 days the patient will work with CCM RN to understand the importance of daily weights  CCM SW Interventions: Completed 08/13/2019 . Outbound call to the patient to assist with care coordination of patient goal . Reviewed previous interventions  and assessed for ongoing or worsening heart palpitation symptoms . Determined the patient has yet to experience heart palpitations upon recent medication change "ever since I stopped taking the half pill and started the whole pill I have not noticed them" . Encouraged the patient to continue taking medication as directed to better manage her ongoing heart failure . Collaboration with embedded RN Case Manager regarding today's intervention   Patient Self Care Activities:  . Self administers medications as prescribed . Attends all scheduled provider appointments . Calls pharmacy for medication refills . Attends church or  other social activities . Performs ADL's independently . Performs IADL's independently . Calls provider office for new concerns or questions  Please see past updates related to this goal by clicking on the "Past Updates" button in the selected goal      . "I just feel weak" (pt-stated)   On track    Current Barriers:  Marland Kitchen Knowledge Deficits related to acute onset of "weakness"   Nurse Case Manager Clinical Goal(s):  Marland Kitchen Over the next 14 days, patient will report the acute onset of weakness has resolved as evidence by patient will have increased energy and will be able to complete her daily activities w/o difficulty or fatigue. GOAL MET . Over the next 30 days, patient will not experience ED visits and or IP admission.   CCM SW Interventions Completed 08/13/2019 . Outbound call to the patient to review progression of patient goal . Determined the patient continues to work with PT and OT through home health o "I still feel a little weak but it is getting better. My appetite is improving" . Discussed the importance of the patient to continue drinking water and eating a balanced diet. The patient endorses consistent use of all prescribed medications . Encouraged the patient to contact her provider with any future health concenrs  Patient Self Care Activities:  . Self administers medications as prescribed . Attends all scheduled provider appointments . Calls pharmacy for medication refills . Performs ADL's independently . Performs IADL's independently . Calls provider office for new concerns or questions   Please see past updates related to this goal by clicking on the "Past Updates" button in the selected goal        Other   . Assist with care coordination by conducting social determinants of health screen       Current Barriers:  Marland Kitchen Knowledge Barriers related to resources and support available to address needs related to Chronic Care Management and challenges surrounding Social  Determinants of Health  Clinical Social Work Clinical Goal(s):   Over the next 20 days, the patient will understand the role of the CCM team and work with SW to complete SDOH (Social Determinants of Health) screen. Completed  New 06/29/2019 Over the next 45 days the patient will work with SW to review SDOH and follow up with identified resource needs  CCM SW Interventions: Completed 08/13/2019  Outbound call placed to review SDOH screening tool and assist with any current challenges  No challenges identified during today's encounter  Reviewed patient decision to decline referral to meals on wheels program at this time due to continued support from her neighbors providing meals  Encouraged the patient to continue with regular meals to better manage diabetes  Discussed plan to contact the patient over the next 60 days to assist with care coordination needs  Patient Self Care Activities:   Calls provider office with new concerns  Performs ADL's independently  See past updates  Follow Up Plan: SW will follow up with patient by phone over the next 45 days   Daneen Schick, BSW, CDP Social Worker, Certified Dementia Practitioner [place] [facility] [place] [facility] [phone]  Total time spent performing care coordination and/or care management activities with the patient by phone or face to face = 15 minutes.

## 2019-08-17 DIAGNOSIS — E559 Vitamin D deficiency, unspecified: Secondary | ICD-10-CM | POA: Diagnosis not present

## 2019-08-17 DIAGNOSIS — I130 Hypertensive heart and chronic kidney disease with heart failure and stage 1 through stage 4 chronic kidney disease, or unspecified chronic kidney disease: Secondary | ICD-10-CM | POA: Diagnosis not present

## 2019-08-17 DIAGNOSIS — E1122 Type 2 diabetes mellitus with diabetic chronic kidney disease: Secondary | ICD-10-CM | POA: Diagnosis not present

## 2019-08-17 DIAGNOSIS — K5730 Diverticulosis of large intestine without perforation or abscess without bleeding: Secondary | ICD-10-CM | POA: Diagnosis not present

## 2019-08-17 DIAGNOSIS — M109 Gout, unspecified: Secondary | ICD-10-CM | POA: Diagnosis not present

## 2019-08-17 DIAGNOSIS — Z794 Long term (current) use of insulin: Secondary | ICD-10-CM | POA: Diagnosis not present

## 2019-08-17 DIAGNOSIS — Z9181 History of falling: Secondary | ICD-10-CM | POA: Diagnosis not present

## 2019-08-17 DIAGNOSIS — I5032 Chronic diastolic (congestive) heart failure: Secondary | ICD-10-CM | POA: Diagnosis not present

## 2019-08-17 DIAGNOSIS — G4733 Obstructive sleep apnea (adult) (pediatric): Secondary | ICD-10-CM | POA: Diagnosis not present

## 2019-08-17 DIAGNOSIS — Z7982 Long term (current) use of aspirin: Secondary | ICD-10-CM | POA: Diagnosis not present

## 2019-08-17 DIAGNOSIS — Z8673 Personal history of transient ischemic attack (TIA), and cerebral infarction without residual deficits: Secondary | ICD-10-CM | POA: Diagnosis not present

## 2019-08-17 DIAGNOSIS — N183 Chronic kidney disease, stage 3 unspecified: Secondary | ICD-10-CM | POA: Diagnosis not present

## 2019-08-17 DIAGNOSIS — E782 Mixed hyperlipidemia: Secondary | ICD-10-CM | POA: Diagnosis not present

## 2019-08-17 DIAGNOSIS — M1990 Unspecified osteoarthritis, unspecified site: Secondary | ICD-10-CM | POA: Diagnosis not present

## 2019-08-17 DIAGNOSIS — E785 Hyperlipidemia, unspecified: Secondary | ICD-10-CM | POA: Diagnosis not present

## 2019-08-21 ENCOUNTER — Ambulatory Visit (INDEPENDENT_AMBULATORY_CARE_PROVIDER_SITE_OTHER): Payer: Medicare Other | Admitting: Podiatry

## 2019-08-21 ENCOUNTER — Encounter: Payer: Self-pay | Admitting: Podiatry

## 2019-08-21 ENCOUNTER — Other Ambulatory Visit: Payer: Self-pay

## 2019-08-21 DIAGNOSIS — E1122 Type 2 diabetes mellitus with diabetic chronic kidney disease: Secondary | ICD-10-CM | POA: Diagnosis not present

## 2019-08-21 DIAGNOSIS — I5032 Chronic diastolic (congestive) heart failure: Secondary | ICD-10-CM | POA: Diagnosis not present

## 2019-08-21 DIAGNOSIS — B351 Tinea unguium: Secondary | ICD-10-CM | POA: Diagnosis not present

## 2019-08-21 DIAGNOSIS — I130 Hypertensive heart and chronic kidney disease with heart failure and stage 1 through stage 4 chronic kidney disease, or unspecified chronic kidney disease: Secondary | ICD-10-CM | POA: Diagnosis not present

## 2019-08-21 DIAGNOSIS — Z9181 History of falling: Secondary | ICD-10-CM | POA: Diagnosis not present

## 2019-08-21 DIAGNOSIS — Z7982 Long term (current) use of aspirin: Secondary | ICD-10-CM | POA: Diagnosis not present

## 2019-08-21 DIAGNOSIS — E782 Mixed hyperlipidemia: Secondary | ICD-10-CM | POA: Diagnosis not present

## 2019-08-21 DIAGNOSIS — E1151 Type 2 diabetes mellitus with diabetic peripheral angiopathy without gangrene: Secondary | ICD-10-CM

## 2019-08-21 DIAGNOSIS — N183 Chronic kidney disease, stage 3 unspecified: Secondary | ICD-10-CM | POA: Diagnosis not present

## 2019-08-21 DIAGNOSIS — Z8673 Personal history of transient ischemic attack (TIA), and cerebral infarction without residual deficits: Secondary | ICD-10-CM | POA: Diagnosis not present

## 2019-08-21 DIAGNOSIS — K5730 Diverticulosis of large intestine without perforation or abscess without bleeding: Secondary | ICD-10-CM | POA: Diagnosis not present

## 2019-08-21 DIAGNOSIS — E559 Vitamin D deficiency, unspecified: Secondary | ICD-10-CM | POA: Diagnosis not present

## 2019-08-21 DIAGNOSIS — G4733 Obstructive sleep apnea (adult) (pediatric): Secondary | ICD-10-CM | POA: Diagnosis not present

## 2019-08-21 DIAGNOSIS — E785 Hyperlipidemia, unspecified: Secondary | ICD-10-CM | POA: Diagnosis not present

## 2019-08-21 DIAGNOSIS — M79676 Pain in unspecified toe(s): Secondary | ICD-10-CM

## 2019-08-21 DIAGNOSIS — Z794 Long term (current) use of insulin: Secondary | ICD-10-CM | POA: Diagnosis not present

## 2019-08-21 DIAGNOSIS — M109 Gout, unspecified: Secondary | ICD-10-CM | POA: Diagnosis not present

## 2019-08-21 DIAGNOSIS — M1990 Unspecified osteoarthritis, unspecified site: Secondary | ICD-10-CM | POA: Diagnosis not present

## 2019-08-21 NOTE — Progress Notes (Signed)
Complaint:  Visit Type: Patient returns to my office for continued preventative foot care services. Complaint: Patient states" my nails have grown long and thick and become painful to walk and wear shoes" Patient has been diagnosed with DM with neuropathy. The patient presents for preventative foot care services. No changes to ROS  Podiatric Exam: Vascular: dorsalis pedis and posterior tibial pulses are palpable bilateral. Capillary return is immediate. Temperature gradient is WNL. Skin turgor WNL  Sensorium: Diminished  Semmes Weinstein monofilament test. Normal tactile sensation bilaterally. Nail Exam: Pt has thick disfigured discolored nails with subungual debris noted bilateral entire nail hallux through fifth toenails Ulcer Exam: There is no evidence of ulcer or pre-ulcerative changes or infection. Orthopedic Exam: Muscle tone and strength are WNL. No limitations in general ROM. No crepitus or effusions noted. Foot type and digits show no abnormalities. Bony prominences are unremarkable.Hammer toes  B/L Skin: No Porokeratosis. No infection or ulcers  Diagnosis:  Onychomycosis, , Pain in right toe, pain in left toes  Treatment & Plan Procedures and Treatment: Consent by patient was obtained for treatment procedures.   Debridement of mycotic and hypertrophic toenails, 1 through 5 bilateral and clearing of subungual debris. No ulceration, no infection noted. Patient is wearing her diabetic shoes for the first time. Return Visit-Office Procedure: Patient instructed to return to the office for a follow up visit 10 weeks  for continued evaluation and treatment.    Gardiner Barefoot DPM

## 2019-08-24 DIAGNOSIS — E559 Vitamin D deficiency, unspecified: Secondary | ICD-10-CM | POA: Diagnosis not present

## 2019-08-24 DIAGNOSIS — E785 Hyperlipidemia, unspecified: Secondary | ICD-10-CM | POA: Diagnosis not present

## 2019-08-24 DIAGNOSIS — Z9181 History of falling: Secondary | ICD-10-CM | POA: Diagnosis not present

## 2019-08-24 DIAGNOSIS — G4733 Obstructive sleep apnea (adult) (pediatric): Secondary | ICD-10-CM | POA: Diagnosis not present

## 2019-08-24 DIAGNOSIS — Z8673 Personal history of transient ischemic attack (TIA), and cerebral infarction without residual deficits: Secondary | ICD-10-CM | POA: Diagnosis not present

## 2019-08-24 DIAGNOSIS — Z794 Long term (current) use of insulin: Secondary | ICD-10-CM | POA: Diagnosis not present

## 2019-08-24 DIAGNOSIS — M1990 Unspecified osteoarthritis, unspecified site: Secondary | ICD-10-CM | POA: Diagnosis not present

## 2019-08-24 DIAGNOSIS — M109 Gout, unspecified: Secondary | ICD-10-CM | POA: Diagnosis not present

## 2019-08-24 DIAGNOSIS — E1122 Type 2 diabetes mellitus with diabetic chronic kidney disease: Secondary | ICD-10-CM | POA: Diagnosis not present

## 2019-08-24 DIAGNOSIS — K5730 Diverticulosis of large intestine without perforation or abscess without bleeding: Secondary | ICD-10-CM | POA: Diagnosis not present

## 2019-08-24 DIAGNOSIS — I5032 Chronic diastolic (congestive) heart failure: Secondary | ICD-10-CM | POA: Diagnosis not present

## 2019-08-24 DIAGNOSIS — N183 Chronic kidney disease, stage 3 unspecified: Secondary | ICD-10-CM | POA: Diagnosis not present

## 2019-08-24 DIAGNOSIS — I130 Hypertensive heart and chronic kidney disease with heart failure and stage 1 through stage 4 chronic kidney disease, or unspecified chronic kidney disease: Secondary | ICD-10-CM | POA: Diagnosis not present

## 2019-08-24 DIAGNOSIS — E782 Mixed hyperlipidemia: Secondary | ICD-10-CM | POA: Diagnosis not present

## 2019-08-24 DIAGNOSIS — Z7982 Long term (current) use of aspirin: Secondary | ICD-10-CM | POA: Diagnosis not present

## 2019-08-31 ENCOUNTER — Other Ambulatory Visit: Payer: Self-pay

## 2019-08-31 ENCOUNTER — Telehealth: Payer: Self-pay

## 2019-08-31 DIAGNOSIS — Z79899 Other long term (current) drug therapy: Secondary | ICD-10-CM

## 2019-08-31 MED ORDER — COLCHICINE 0.6 MG PO TABS: 0.6000 mg | ORAL_TABLET | Freq: Every day | ORAL | 0 refills | Status: DC

## 2019-09-01 ENCOUNTER — Ambulatory Visit: Payer: Self-pay

## 2019-09-01 DIAGNOSIS — E782 Mixed hyperlipidemia: Secondary | ICD-10-CM

## 2019-09-01 DIAGNOSIS — Z794 Long term (current) use of insulin: Secondary | ICD-10-CM

## 2019-09-01 DIAGNOSIS — I129 Hypertensive chronic kidney disease with stage 1 through stage 4 chronic kidney disease, or unspecified chronic kidney disease: Secondary | ICD-10-CM

## 2019-09-01 DIAGNOSIS — E1122 Type 2 diabetes mellitus with diabetic chronic kidney disease: Secondary | ICD-10-CM

## 2019-09-01 NOTE — Chronic Care Management (AMB) (Signed)
  Chronic Care Management   Outreach Note  09/01/2019 Name: Maria [name] MRN: [id number] DOB: 04-Nov-1925  Referred by: Glendale Chard, MD Reason for referral : Chronic Care Management (CCM RNCM Telephone Follow up )   An unsuccessful telephone outreach was attempted today. The patient was referred to the case management team by Glendale Chard MD for assistance with care management and care coordination.   Follow Up Plan: A HIPPA compliant phone message was left for the patient providing contact information and requesting a return call.  Telephone follow up appointment with care management team member scheduled for: 09/23/19  Barb Merino, RN, BSN, CCM Care Management Coordinator [place] [facility]  Direct Phone: [phone]

## 2019-09-02 DIAGNOSIS — N183 Chronic kidney disease, stage 3 unspecified: Secondary | ICD-10-CM | POA: Diagnosis not present

## 2019-09-02 DIAGNOSIS — E782 Mixed hyperlipidemia: Secondary | ICD-10-CM | POA: Diagnosis not present

## 2019-09-02 DIAGNOSIS — K5730 Diverticulosis of large intestine without perforation or abscess without bleeding: Secondary | ICD-10-CM | POA: Diagnosis not present

## 2019-09-02 DIAGNOSIS — Z7982 Long term (current) use of aspirin: Secondary | ICD-10-CM | POA: Diagnosis not present

## 2019-09-02 DIAGNOSIS — I130 Hypertensive heart and chronic kidney disease with heart failure and stage 1 through stage 4 chronic kidney disease, or unspecified chronic kidney disease: Secondary | ICD-10-CM | POA: Diagnosis not present

## 2019-09-02 DIAGNOSIS — M1990 Unspecified osteoarthritis, unspecified site: Secondary | ICD-10-CM | POA: Diagnosis not present

## 2019-09-02 DIAGNOSIS — E785 Hyperlipidemia, unspecified: Secondary | ICD-10-CM | POA: Diagnosis not present

## 2019-09-02 DIAGNOSIS — Z794 Long term (current) use of insulin: Secondary | ICD-10-CM | POA: Diagnosis not present

## 2019-09-02 DIAGNOSIS — I5032 Chronic diastolic (congestive) heart failure: Secondary | ICD-10-CM | POA: Diagnosis not present

## 2019-09-02 DIAGNOSIS — G4733 Obstructive sleep apnea (adult) (pediatric): Secondary | ICD-10-CM | POA: Diagnosis not present

## 2019-09-02 DIAGNOSIS — Z9181 History of falling: Secondary | ICD-10-CM | POA: Diagnosis not present

## 2019-09-02 DIAGNOSIS — M109 Gout, unspecified: Secondary | ICD-10-CM | POA: Diagnosis not present

## 2019-09-02 DIAGNOSIS — E1122 Type 2 diabetes mellitus with diabetic chronic kidney disease: Secondary | ICD-10-CM | POA: Diagnosis not present

## 2019-09-02 DIAGNOSIS — E559 Vitamin D deficiency, unspecified: Secondary | ICD-10-CM | POA: Diagnosis not present

## 2019-09-02 DIAGNOSIS — Z8673 Personal history of transient ischemic attack (TIA), and cerebral infarction without residual deficits: Secondary | ICD-10-CM | POA: Diagnosis not present

## 2019-09-03 ENCOUNTER — Other Ambulatory Visit: Payer: Self-pay | Admitting: Internal Medicine

## 2019-09-09 DIAGNOSIS — Z7982 Long term (current) use of aspirin: Secondary | ICD-10-CM | POA: Diagnosis not present

## 2019-09-09 DIAGNOSIS — E785 Hyperlipidemia, unspecified: Secondary | ICD-10-CM | POA: Diagnosis not present

## 2019-09-09 DIAGNOSIS — E782 Mixed hyperlipidemia: Secondary | ICD-10-CM | POA: Diagnosis not present

## 2019-09-09 DIAGNOSIS — E1122 Type 2 diabetes mellitus with diabetic chronic kidney disease: Secondary | ICD-10-CM | POA: Diagnosis not present

## 2019-09-09 DIAGNOSIS — Z9181 History of falling: Secondary | ICD-10-CM | POA: Diagnosis not present

## 2019-09-09 DIAGNOSIS — K5730 Diverticulosis of large intestine without perforation or abscess without bleeding: Secondary | ICD-10-CM | POA: Diagnosis not present

## 2019-09-09 DIAGNOSIS — N183 Chronic kidney disease, stage 3 unspecified: Secondary | ICD-10-CM | POA: Diagnosis not present

## 2019-09-09 DIAGNOSIS — G4733 Obstructive sleep apnea (adult) (pediatric): Secondary | ICD-10-CM | POA: Diagnosis not present

## 2019-09-09 DIAGNOSIS — M109 Gout, unspecified: Secondary | ICD-10-CM | POA: Diagnosis not present

## 2019-09-09 DIAGNOSIS — Z8673 Personal history of transient ischemic attack (TIA), and cerebral infarction without residual deficits: Secondary | ICD-10-CM | POA: Diagnosis not present

## 2019-09-09 DIAGNOSIS — M1990 Unspecified osteoarthritis, unspecified site: Secondary | ICD-10-CM | POA: Diagnosis not present

## 2019-09-09 DIAGNOSIS — E559 Vitamin D deficiency, unspecified: Secondary | ICD-10-CM | POA: Diagnosis not present

## 2019-09-09 DIAGNOSIS — I130 Hypertensive heart and chronic kidney disease with heart failure and stage 1 through stage 4 chronic kidney disease, or unspecified chronic kidney disease: Secondary | ICD-10-CM | POA: Diagnosis not present

## 2019-09-09 DIAGNOSIS — Z794 Long term (current) use of insulin: Secondary | ICD-10-CM | POA: Diagnosis not present

## 2019-09-09 DIAGNOSIS — I5032 Chronic diastolic (congestive) heart failure: Secondary | ICD-10-CM | POA: Diagnosis not present

## 2019-09-15 ENCOUNTER — Ambulatory Visit (INDEPENDENT_AMBULATORY_CARE_PROVIDER_SITE_OTHER): Payer: Medicare Other | Admitting: Internal Medicine

## 2019-09-15 ENCOUNTER — Other Ambulatory Visit: Payer: Self-pay

## 2019-09-15 ENCOUNTER — Encounter: Payer: Self-pay | Admitting: Internal Medicine

## 2019-09-15 VITALS — BP 118/76 | HR 84 | Temp 98.9°F | Ht 59.0 in | Wt 159.2 lb

## 2019-09-15 DIAGNOSIS — Z794 Long term (current) use of insulin: Secondary | ICD-10-CM

## 2019-09-15 DIAGNOSIS — I129 Hypertensive chronic kidney disease with stage 1 through stage 4 chronic kidney disease, or unspecified chronic kidney disease: Secondary | ICD-10-CM

## 2019-09-15 DIAGNOSIS — N184 Chronic kidney disease, stage 4 (severe): Secondary | ICD-10-CM | POA: Insufficient documentation

## 2019-09-15 DIAGNOSIS — E1151 Type 2 diabetes mellitus with diabetic peripheral angiopathy without gangrene: Secondary | ICD-10-CM

## 2019-09-15 DIAGNOSIS — E1122 Type 2 diabetes mellitus with diabetic chronic kidney disease: Secondary | ICD-10-CM | POA: Diagnosis not present

## 2019-09-15 DIAGNOSIS — Z6832 Body mass index (BMI) 32.0-32.9, adult: Secondary | ICD-10-CM

## 2019-09-15 DIAGNOSIS — N183 Chronic kidney disease, stage 3 unspecified: Secondary | ICD-10-CM | POA: Diagnosis not present

## 2019-09-15 DIAGNOSIS — R233 Spontaneous ecchymoses: Secondary | ICD-10-CM

## 2019-09-15 DIAGNOSIS — Z20822 Contact with and (suspected) exposure to covid-19: Secondary | ICD-10-CM

## 2019-09-15 DIAGNOSIS — Z139 Encounter for screening, unspecified: Secondary | ICD-10-CM | POA: Diagnosis not present

## 2019-09-15 DIAGNOSIS — E6609 Other obesity due to excess calories: Secondary | ICD-10-CM

## 2019-09-15 DIAGNOSIS — L0232 Furuncle of buttock: Secondary | ICD-10-CM

## 2019-09-15 LAB — POCT UA - MICROALBUMIN
Albumin/Creatinine Ratio, Urine, POC: >30
Creatinine, POC: 50 mg/dL
Microalbumin Ur, POC: 10 mg/L

## 2019-09-15 NOTE — Progress Notes (Signed)
This visit occurred during the SARS-CoV-2 public health emergency.  Safety protocols were in place, including screening questions prior to the visit, additional usage of staff PPE, and extensive cleaning of exam room while observing appropriate contact time as indicated for disinfecting solutions.  Subjective:     Patient ID: Maria [name] , female    DOB: [birth year] , 86 y.o.   MRN: [id number]   Chief Complaint  Patient presents with  . Diabetes  . Hypertension    HPI  Diabetes She presents for her follow-up diabetic visit. She has type 2 diabetes mellitus. Her disease course has been fluctuating. There are no hypoglycemic associated symptoms. Pertinent negatives for diabetes include no blurred vision and no chest pain. There are no hypoglycemic complications. Diabetic complications include nephropathy. Risk factors for coronary artery disease include diabetes mellitus, dyslipidemia, hypertension, obesity, sedentary lifestyle and post-menopausal.  Hypertension This is a chronic problem. The current episode started more than 1 year ago. The problem has been gradually improving since onset. The problem is controlled. Pertinent negatives include no blurred vision, chest pain, palpitations or shortness of breath.     Past Medical History:  Diagnosis Date  . Anxiety   . Arthritis   . Blood transfusion   . Depression   . Diabetes mellitus   . Edema   . Heart murmur   . Hyperlipidemia   . Hypertension   . Sleep apnea    cpap  . Stroke ([place])    3 x tias  . TIA (transient ischemic attack)      Family History  Problem Relation Age of Onset  . Prostate cancer Other   . Healthy Mother   . Prostate cancer Father      Current Outpatient Medications:  .  Accu-Chek FastClix Lancets MISC, CHECK BLOOD SUGAR BEFORE BREAKFAST AND DINNER, Disp: 306 each, Rfl: 1 .  ACCU-CHEK SMARTVIEW test strip, U TO CHECK BID BEFORE BRE AND DINNER dx: e11.65, Disp: 300 each, Rfl: 2 .  allopurinol  (ZYLOPRIM) 300 MG tablet, Take 1 tablet (300 mg total) by mouth daily., Disp: 90 tablet, Rfl: 0 .  aspirin 81 MG tablet, Take 1 tablet (81 mg total) by mouth daily., Disp: , Rfl:  .  atorvastatin (LIPITOR) 40 MG tablet, TAKE 1 TABLET DAILY, Disp: 90 tablet, Rfl: 3 .  Cholecalciferol (VITAMIN D) 2000 UNITS tablet, Take 2,000 Units by mouth daily., Disp: , Rfl:  .  citalopram (CELEXA) 20 MG tablet, Take 20 mg by mouth daily., Disp: , Rfl:  .  colchicine 0.6 MG tablet, Take 1 tablet (0.6 mg total) by mouth daily., Disp: 30 tablet, Rfl: 0 .  diclofenac Sodium (VOLTAREN) 1 % GEL, Apply 2 g topically 4 (four) times daily., Disp: 50 g, Rfl: 2 .  docusate sodium (COLACE) 100 MG capsule, Take 100 mg by mouth daily., Disp: , Rfl:  .  donepezil (ARICEPT) 10 MG tablet, TAKE 1 TABLET DAILY IN THE EVENING, Disp: 90 tablet, Rfl: 3 .  ezetimibe (ZETIA) 10 MG tablet, TAKE 1 TABLET AT BEDTIME, Disp: 90 tablet, Rfl: 3 .  FOLBIC 2.5-25-2 MG TABS tablet, TAKE 1 TABLET DAILY, Disp: 90 tablet, Rfl: 3 .  furosemide (LASIX) 40 MG tablet, TAKE 1 TABLET TWICE A DAY (NEED TO SCHEDULE AN APPOINTMENT), Disp: 90 tablet, Rfl: 1 .  hydrALAZINE (APRESOLINE) 25 MG tablet, TAKE 1 TABLET THREE TIMES A DAY, Disp: 270 tablet, Rfl: 3 .  Insulin Glargine (LANTUS SOLOSTAR) 100 UNIT/ML Solostar Pen, Inject 12 Units into  the skin at bedtime., Disp: , Rfl:  .  Insulin Pen Needle (BD PEN NEEDLE MICRO U/F) 32G X 6 MM MISC, Use as directed, Disp: 300 each, Rfl: 2 .  isosorbide mononitrate (IMDUR) 30 MG 24 hr tablet, Take 1 tablet (30 mg total) by mouth daily., Disp: 90 tablet, Rfl: 3 .  loratadine (CLARITIN) 10 MG tablet, Take 10 mg by mouth every morning., Disp: , Rfl:  .  Magnesium 250 MG TABS, Take by mouth at bedtime., Disp: , Rfl:  .  metolazone (ZAROXOLYN) 2.5 MG tablet, TAKE 1 TABLET EVERY OTHER DAY 30 MINUTES PRIOR TO YOUR MORNING LASIX DOSE, Disp: 45 tablet, Rfl: 3 .  metoprolol succinate (TOPROL-XL) 25 MG 24 hr tablet, TAKE 1  TABLET(25 MG) BY MOUTH DAILY, Disp: 90 tablet, Rfl: 0 .  telmisartan (MICARDIS) 80 MG tablet, TAKE 1 TABLET DAILY, Disp: 90 tablet, Rfl: 3   Allergies  Allergen Reactions  . Ace Inhibitors Other (See Comments)    Angioedema   . Valsartan Other (See Comments)     Review of Systems  Constitutional: Negative.   Eyes: Negative for blurred vision.  Respiratory: Negative.  Negative for shortness of breath.   Cardiovascular: Negative.  Negative for chest pain and palpitations.  Gastrointestinal: Negative.   Musculoskeletal: Positive for arthralgias.  Skin:       Noticed bruise on l shoulder a week ago. Doesn't recall hitting herself. It is not painful.   Also c/o boil on her buttocks. Not sure what triggered her sx.   Neurological: Negative.   Psychiatric/Behavioral: Negative.      Today's Vitals   09/15/19 0905  BP: 118/76  Pulse: 84  Temp: 98.9 F (37.2 C)  TempSrc: Oral  Weight: 159 lb 3.2 oz (72.2 kg)  Height: _0  (1.499 m)   Body mass index is 32.15 kg/m.   Objective:  Physical Exam Vitals and nursing note reviewed.  Constitutional:      Appearance: Normal appearance. She is obese.  HENT:     Head: Normocephalic and atraumatic.  Cardiovascular:     Rate and Rhythm: Normal rate and regular rhythm.     Heart sounds: Normal heart sounds.  Pulmonary:     Effort: Pulmonary effort is normal.     Breath sounds: Normal breath sounds.  Skin:    General: Skin is warm.     Comments: Raised, sl. Tender, erythematous lesion on left buttock. No drainage.   Neurological:     General: No focal deficit present.     Mental Status: She is alert.  Psychiatric:        Mood and Affect: Mood normal.        Behavior: Behavior normal.         Assessment And Plan:     1. Type 2 diabetes mellitus with stage 3 chronic kidney disease, with long-term current use of insulin, unspecified whether stage 3a or 3b CKD (HCC)  Chronic, yet stable. I will check labs as listed below. I  discussed Nephrology referral, and she declines at this time.   - POCT UA - Microalbumin - CMP14+EGFR - CBC no Diff - Phosphorus - Protein electrophoresis, serum - Hemoglobin A1c  2. Hypertensive nephropathy  Chronic, well controlled. She will continue with current meds. She is encouraged to avoid adding salt to her foods.   4. Diabetic peripheral vascular disease (HCC)  Chronic, yet stable. Importance of statin compliance was discussed with the patient.   5. Spontaneous ecchymoses  I will  check a CBC today. I will make further recommendations once her labs are available for review.   6. Class 1 obesity due to excess calories with serious comorbidity and body mass index (BMI) of 32.0 to 32.9 in adult  She is advised to lose 7-10 pounds to decrease cardiac risk. Goal BMI is 30 or less.   7. Boil of buttock  She was given rx Keflex to take every 12 hours due to her current renal function. She is encouraged to complete full abx course. She is also advised to apply warm compress to area as needed.   8. Encounter for screening  I will perform COVID testing as pt requested.   - Novel Coronavirus, NAA ([facility])    Maximino Greenland, MD    THE PATIENT IS ENCOURAGED TO PRACTICE SOCIAL DISTANCING DUE TO THE COVID-19 PANDEMIC.

## 2019-09-16 MED ORDER — CEPHALEXIN 500 MG PO CAPS: 500.0000 mg | ORAL_CAPSULE | Freq: Two times a day (BID) | ORAL | 0 refills | Status: AC

## 2019-09-17 ENCOUNTER — Ambulatory Visit: Payer: Self-pay

## 2019-09-17 ENCOUNTER — Telehealth: Payer: Self-pay

## 2019-09-17 DIAGNOSIS — Z9181 History of falling: Secondary | ICD-10-CM | POA: Diagnosis not present

## 2019-09-17 DIAGNOSIS — I130 Hypertensive heart and chronic kidney disease with heart failure and stage 1 through stage 4 chronic kidney disease, or unspecified chronic kidney disease: Secondary | ICD-10-CM | POA: Diagnosis not present

## 2019-09-17 DIAGNOSIS — Z7982 Long term (current) use of aspirin: Secondary | ICD-10-CM | POA: Diagnosis not present

## 2019-09-17 DIAGNOSIS — Z794 Long term (current) use of insulin: Secondary | ICD-10-CM | POA: Diagnosis not present

## 2019-09-17 DIAGNOSIS — I5032 Chronic diastolic (congestive) heart failure: Secondary | ICD-10-CM | POA: Diagnosis not present

## 2019-09-17 DIAGNOSIS — E785 Hyperlipidemia, unspecified: Secondary | ICD-10-CM | POA: Diagnosis not present

## 2019-09-17 DIAGNOSIS — N183 Chronic kidney disease, stage 3 unspecified: Secondary | ICD-10-CM | POA: Diagnosis not present

## 2019-09-17 DIAGNOSIS — M1990 Unspecified osteoarthritis, unspecified site: Secondary | ICD-10-CM | POA: Diagnosis not present

## 2019-09-17 DIAGNOSIS — E1122 Type 2 diabetes mellitus with diabetic chronic kidney disease: Secondary | ICD-10-CM | POA: Diagnosis not present

## 2019-09-17 DIAGNOSIS — Z8673 Personal history of transient ischemic attack (TIA), and cerebral infarction without residual deficits: Secondary | ICD-10-CM | POA: Diagnosis not present

## 2019-09-17 DIAGNOSIS — E559 Vitamin D deficiency, unspecified: Secondary | ICD-10-CM | POA: Diagnosis not present

## 2019-09-17 DIAGNOSIS — K5730 Diverticulosis of large intestine without perforation or abscess without bleeding: Secondary | ICD-10-CM | POA: Diagnosis not present

## 2019-09-17 DIAGNOSIS — M109 Gout, unspecified: Secondary | ICD-10-CM | POA: Diagnosis not present

## 2019-09-17 DIAGNOSIS — E782 Mixed hyperlipidemia: Secondary | ICD-10-CM | POA: Diagnosis not present

## 2019-09-17 DIAGNOSIS — G4733 Obstructive sleep apnea (adult) (pediatric): Secondary | ICD-10-CM | POA: Diagnosis not present

## 2019-09-17 LAB — CMP14+EGFR
ALT: 18 IU/L (ref 0–32)
AST: 24 IU/L (ref 0–40)
Albumin/Globulin Ratio: 1.6 (ref 1.2–2.2)
Albumin: 4 g/dL (ref 3.5–4.6)
Alkaline Phosphatase: 108 IU/L (ref 39–117)
BUN/Creatinine Ratio: 23 (ref 12–28)
BUN: 37 mg/dL — ABNORMAL HIGH (ref 10–36)
Bilirubin Total: 0.3 mg/dL (ref 0.0–1.2)
CO2: 28 mmol/L (ref 20–29)
Calcium: 9.1 mg/dL (ref 8.7–10.3)
Chloride: 99 mmol/L (ref 96–106)
Creatinine, Ser: 1.6 mg/dL — ABNORMAL HIGH (ref 0.57–1.00)
GFR calc Af Amer: 32 mL/min/{1.73_m2} — ABNORMAL LOW (ref >59)
GFR calc non Af Amer: 28 mL/min/{1.73_m2} — ABNORMAL LOW (ref >59)
Globulin, Total: 2.5 g/dL (ref 1.5–4.5)
Glucose: 150 mg/dL — ABNORMAL HIGH (ref 65–99)
Potassium: 4.2 mmol/L (ref 3.5–5.2)
Sodium: 142 mmol/L (ref 134–144)
Total Protein: 6.5 g/dL (ref 6.0–8.5)

## 2019-09-17 LAB — PROTEIN ELECTROPHORESIS, SERUM
A/G Ratio: 1.2 (ref 0.7–1.7)
Albumin ELP: 3.5 g/dL (ref 2.9–4.4)
Alpha 1: 0.3 g/dL (ref 0.0–0.4)
Alpha 2: 0.8 g/dL (ref 0.4–1.0)
Beta: 1 g/dL (ref 0.7–1.3)
Gamma Globulin: 0.9 g/dL (ref 0.4–1.8)
Globulin, Total: 3 g/dL (ref 2.2–3.9)

## 2019-09-17 LAB — CBC
Hematocrit: 30.1 % — ABNORMAL LOW (ref 34.0–46.6)
Hemoglobin: 9.2 g/dL — ABNORMAL LOW (ref 11.1–15.9)
MCH: 27 pg (ref 26.6–33.0)
MCHC: 30.6 g/dL — ABNORMAL LOW (ref 31.5–35.7)
MCV: 88 fL (ref 79–97)
Platelets: 293 10*3/uL (ref 150–450)
RBC: 3.41 x10E6/uL — ABNORMAL LOW (ref 3.77–5.28)
RDW: 17.9 % — ABNORMAL HIGH (ref 11.7–15.4)
WBC: 6.7 10*3/uL (ref 3.4–10.8)

## 2019-09-17 LAB — NOVEL CORONAVIRUS, NAA: SARS-CoV-2, NAA: NOT DETECTED

## 2019-09-17 LAB — PHOSPHORUS: Phosphorus: 3.3 mg/dL (ref 3.0–4.3)

## 2019-09-17 LAB — HEMOGLOBIN A1C
Est. average glucose Bld gHb Est-mCnc: 146 mg/dL
Hgb A1c MFr Bld: 6.7 % — ABNORMAL HIGH (ref 4.8–5.6)

## 2019-09-17 NOTE — Chronic Care Management (AMB) (Signed)
  Chronic Care Management   Outreach Note  09/17/2019 Name: Maria [name] MRN: [id number] DOB: Oct 25, 1925  Referred by: Glendale Chard, MD Reason for referral : Care Coordination   SW placed an unsuccessful outbound call to the patient to assist with care coordination needs. SW left a HIPAA compliant voice message requesting a return call.  Follow Up Plan: The care management team will reach out to the patient again over the next 21 days.   Daneen Schick, BSW, CDP Social Worker, Certified Dementia Practitioner [facility] [phone]

## 2019-09-18 ENCOUNTER — Other Ambulatory Visit: Payer: Self-pay

## 2019-09-18 DIAGNOSIS — M1A09X0 Idiopathic chronic gout, multiple sites, without tophus (tophi): Secondary | ICD-10-CM

## 2019-09-18 MED ORDER — ALLOPURINOL 300 MG PO TABS: 300.0000 mg | ORAL_TABLET | Freq: Every day | ORAL | 0 refills | Status: DC

## 2019-09-18 NOTE — Telephone Encounter (Signed)
Ok to refill 

## 2019-09-18 NOTE — Telephone Encounter (Signed)
Refill request received via fax from [facility] on [facility] for allopurinol.   Last Visit: 07/10/2019 Next Visit: 10/16/2019 Labs: 09/15/2019 stable. Creat 1.60, GFR 32.  Okay to refill allopurinol?

## 2019-09-21 DIAGNOSIS — I130 Hypertensive heart and chronic kidney disease with heart failure and stage 1 through stage 4 chronic kidney disease, or unspecified chronic kidney disease: Secondary | ICD-10-CM | POA: Diagnosis not present

## 2019-09-21 DIAGNOSIS — G4733 Obstructive sleep apnea (adult) (pediatric): Secondary | ICD-10-CM | POA: Diagnosis not present

## 2019-09-21 DIAGNOSIS — Z794 Long term (current) use of insulin: Secondary | ICD-10-CM | POA: Diagnosis not present

## 2019-09-21 DIAGNOSIS — K5730 Diverticulosis of large intestine without perforation or abscess without bleeding: Secondary | ICD-10-CM | POA: Diagnosis not present

## 2019-09-21 DIAGNOSIS — Z9181 History of falling: Secondary | ICD-10-CM | POA: Diagnosis not present

## 2019-09-21 DIAGNOSIS — E559 Vitamin D deficiency, unspecified: Secondary | ICD-10-CM | POA: Diagnosis not present

## 2019-09-21 DIAGNOSIS — E785 Hyperlipidemia, unspecified: Secondary | ICD-10-CM | POA: Diagnosis not present

## 2019-09-21 DIAGNOSIS — E1122 Type 2 diabetes mellitus with diabetic chronic kidney disease: Secondary | ICD-10-CM | POA: Diagnosis not present

## 2019-09-21 DIAGNOSIS — I5032 Chronic diastolic (congestive) heart failure: Secondary | ICD-10-CM | POA: Diagnosis not present

## 2019-09-21 DIAGNOSIS — N183 Chronic kidney disease, stage 3 unspecified: Secondary | ICD-10-CM | POA: Diagnosis not present

## 2019-09-21 DIAGNOSIS — Z7982 Long term (current) use of aspirin: Secondary | ICD-10-CM | POA: Diagnosis not present

## 2019-09-21 DIAGNOSIS — E782 Mixed hyperlipidemia: Secondary | ICD-10-CM | POA: Diagnosis not present

## 2019-09-21 DIAGNOSIS — M1990 Unspecified osteoarthritis, unspecified site: Secondary | ICD-10-CM | POA: Diagnosis not present

## 2019-09-21 DIAGNOSIS — M109 Gout, unspecified: Secondary | ICD-10-CM | POA: Diagnosis not present

## 2019-09-21 DIAGNOSIS — Z8673 Personal history of transient ischemic attack (TIA), and cerebral infarction without residual deficits: Secondary | ICD-10-CM | POA: Diagnosis not present

## 2019-09-23 ENCOUNTER — Telehealth: Payer: Self-pay

## 2019-09-30 ENCOUNTER — Telehealth: Payer: Self-pay

## 2019-10-02 DIAGNOSIS — K5730 Diverticulosis of large intestine without perforation or abscess without bleeding: Secondary | ICD-10-CM | POA: Diagnosis not present

## 2019-10-02 DIAGNOSIS — Z7982 Long term (current) use of aspirin: Secondary | ICD-10-CM | POA: Diagnosis not present

## 2019-10-02 DIAGNOSIS — M1990 Unspecified osteoarthritis, unspecified site: Secondary | ICD-10-CM | POA: Diagnosis not present

## 2019-10-02 DIAGNOSIS — Z8673 Personal history of transient ischemic attack (TIA), and cerebral infarction without residual deficits: Secondary | ICD-10-CM | POA: Diagnosis not present

## 2019-10-02 DIAGNOSIS — I130 Hypertensive heart and chronic kidney disease with heart failure and stage 1 through stage 4 chronic kidney disease, or unspecified chronic kidney disease: Secondary | ICD-10-CM | POA: Diagnosis not present

## 2019-10-02 DIAGNOSIS — M109 Gout, unspecified: Secondary | ICD-10-CM | POA: Diagnosis not present

## 2019-10-02 DIAGNOSIS — I5032 Chronic diastolic (congestive) heart failure: Secondary | ICD-10-CM | POA: Diagnosis not present

## 2019-10-02 DIAGNOSIS — Z9181 History of falling: Secondary | ICD-10-CM | POA: Diagnosis not present

## 2019-10-02 DIAGNOSIS — E559 Vitamin D deficiency, unspecified: Secondary | ICD-10-CM | POA: Diagnosis not present

## 2019-10-02 DIAGNOSIS — E782 Mixed hyperlipidemia: Secondary | ICD-10-CM | POA: Diagnosis not present

## 2019-10-02 DIAGNOSIS — E1122 Type 2 diabetes mellitus with diabetic chronic kidney disease: Secondary | ICD-10-CM | POA: Diagnosis not present

## 2019-10-02 DIAGNOSIS — Z794 Long term (current) use of insulin: Secondary | ICD-10-CM | POA: Diagnosis not present

## 2019-10-02 DIAGNOSIS — N183 Chronic kidney disease, stage 3 unspecified: Secondary | ICD-10-CM | POA: Diagnosis not present

## 2019-10-02 DIAGNOSIS — G4733 Obstructive sleep apnea (adult) (pediatric): Secondary | ICD-10-CM | POA: Diagnosis not present

## 2019-10-02 DIAGNOSIS — E785 Hyperlipidemia, unspecified: Secondary | ICD-10-CM | POA: Diagnosis not present

## 2019-10-04 ENCOUNTER — Other Ambulatory Visit: Payer: Self-pay | Admitting: Internal Medicine

## 2019-10-05 ENCOUNTER — Other Ambulatory Visit: Payer: Self-pay

## 2019-10-05 ENCOUNTER — Telehealth: Payer: Self-pay

## 2019-10-05 ENCOUNTER — Ambulatory Visit: Payer: Medicare Other | Admitting: Rheumatology

## 2019-10-05 ENCOUNTER — Ambulatory Visit: Payer: Self-pay

## 2019-10-05 ENCOUNTER — Encounter: Payer: Self-pay | Admitting: Rheumatology

## 2019-10-05 VITALS — BP 124/67 | HR 77 | Resp 14 | Ht 59.0 in | Wt 162.2 lb

## 2019-10-05 DIAGNOSIS — I130 Hypertensive heart and chronic kidney disease with heart failure and stage 1 through stage 4 chronic kidney disease, or unspecified chronic kidney disease: Secondary | ICD-10-CM | POA: Diagnosis not present

## 2019-10-05 DIAGNOSIS — N183 Chronic kidney disease, stage 3 unspecified: Secondary | ICD-10-CM

## 2019-10-05 DIAGNOSIS — I5032 Chronic diastolic (congestive) heart failure: Secondary | ICD-10-CM

## 2019-10-05 DIAGNOSIS — M19071 Primary osteoarthritis, right ankle and foot: Secondary | ICD-10-CM

## 2019-10-05 DIAGNOSIS — M1990 Unspecified osteoarthritis, unspecified site: Secondary | ICD-10-CM | POA: Diagnosis not present

## 2019-10-05 DIAGNOSIS — Z8673 Personal history of transient ischemic attack (TIA), and cerebral infarction without residual deficits: Secondary | ICD-10-CM | POA: Diagnosis not present

## 2019-10-05 DIAGNOSIS — Z7982 Long term (current) use of aspirin: Secondary | ICD-10-CM | POA: Diagnosis not present

## 2019-10-05 DIAGNOSIS — M65322 Trigger finger, left index finger: Secondary | ICD-10-CM | POA: Diagnosis not present

## 2019-10-05 DIAGNOSIS — M170 Bilateral primary osteoarthritis of knee: Secondary | ICD-10-CM

## 2019-10-05 DIAGNOSIS — E782 Mixed hyperlipidemia: Secondary | ICD-10-CM | POA: Diagnosis not present

## 2019-10-05 DIAGNOSIS — M79641 Pain in right hand: Secondary | ICD-10-CM | POA: Diagnosis not present

## 2019-10-05 DIAGNOSIS — E559 Vitamin D deficiency, unspecified: Secondary | ICD-10-CM | POA: Diagnosis not present

## 2019-10-05 DIAGNOSIS — M19072 Primary osteoarthritis, left ankle and foot: Secondary | ICD-10-CM

## 2019-10-05 DIAGNOSIS — I110 Hypertensive heart disease with heart failure: Secondary | ICD-10-CM

## 2019-10-05 DIAGNOSIS — Z8639 Personal history of other endocrine, nutritional and metabolic disease: Secondary | ICD-10-CM

## 2019-10-05 DIAGNOSIS — M19042 Primary osteoarthritis, left hand: Secondary | ICD-10-CM

## 2019-10-05 DIAGNOSIS — Z8719 Personal history of other diseases of the digestive system: Secondary | ICD-10-CM

## 2019-10-05 DIAGNOSIS — G4733 Obstructive sleep apnea (adult) (pediatric): Secondary | ICD-10-CM | POA: Diagnosis not present

## 2019-10-05 DIAGNOSIS — Z5181 Encounter for therapeutic drug level monitoring: Secondary | ICD-10-CM | POA: Diagnosis not present

## 2019-10-05 DIAGNOSIS — M19041 Primary osteoarthritis, right hand: Secondary | ICD-10-CM

## 2019-10-05 DIAGNOSIS — Z9181 History of falling: Secondary | ICD-10-CM | POA: Diagnosis not present

## 2019-10-05 DIAGNOSIS — M1A09X0 Idiopathic chronic gout, multiple sites, without tophus (tophi): Secondary | ICD-10-CM

## 2019-10-05 DIAGNOSIS — E1122 Type 2 diabetes mellitus with diabetic chronic kidney disease: Secondary | ICD-10-CM

## 2019-10-05 DIAGNOSIS — M79642 Pain in left hand: Secondary | ICD-10-CM

## 2019-10-05 DIAGNOSIS — Z794 Long term (current) use of insulin: Secondary | ICD-10-CM

## 2019-10-05 DIAGNOSIS — K5730 Diverticulosis of large intestine without perforation or abscess without bleeding: Secondary | ICD-10-CM | POA: Diagnosis not present

## 2019-10-05 DIAGNOSIS — N189 Chronic kidney disease, unspecified: Secondary | ICD-10-CM

## 2019-10-05 DIAGNOSIS — Z9989 Dependence on other enabling machines and devices: Secondary | ICD-10-CM

## 2019-10-05 DIAGNOSIS — M109 Gout, unspecified: Secondary | ICD-10-CM | POA: Diagnosis not present

## 2019-10-05 DIAGNOSIS — E785 Hyperlipidemia, unspecified: Secondary | ICD-10-CM | POA: Diagnosis not present

## 2019-10-05 DIAGNOSIS — Z79899 Other long term (current) drug therapy: Secondary | ICD-10-CM

## 2019-10-05 DIAGNOSIS — N289 Disorder of kidney and ureter, unspecified: Secondary | ICD-10-CM

## 2019-10-05 MED ORDER — PREDNISONE 5 MG PO TABS: 5.0000 mg | ORAL_TABLET | Freq: Every day | ORAL | 0 refills | Status: DC

## 2019-10-05 MED ORDER — COLCHICINE 0.6 MG PO TABS: 0.6000 mg | ORAL_TABLET | Freq: Every day | ORAL | 0 refills | Status: DC

## 2019-10-05 NOTE — Progress Notes (Signed)
Office Visit Note  Patient: Maria [name]             Date of Birth: 1926-08-09           MRN: [id number]             PCP: Glendale Chard, MD Referring: Glendale Chard, MD Visit Date: 10/05/2019 Occupation: '@GUAROCC' @  Subjective:  Pain in both hands   History of Present Illness: Maria [name] is a 86 y.o. female with history of gout and osteoarthritis. She is taking allopurinol 300 mg 1 tablet daily and colchicine 0.6 mg 1 tablet by mouth daily.  She is having increased pain and swelling in both hands.  She has had difficulty performing ADLs due to the discomfort. She is having difficulty cooking and injecting insulin daily. She has been working with a physical therapist but has not noticed much improvement. She has been soaking her hands in epsom salt baths daily. She continues to have chronic pain in both knee joints.  She has intermittent swelling in both knee joints. She uses a walker to assist with ambulation.     Activities of Daily Living:  Patient reports joint stiffness all day Patient Reports nocturnal pain.  Difficulty dressing/grooming: Denies Difficulty climbing stairs: Reports Difficulty getting out of chair: Reports Difficulty using hands for taps, buttons, cutlery, and/or writing: Reports  Review of Systems  Constitutional: Positive for fatigue.  HENT: Negative for mouth sores, mouth dryness and nose dryness.   Eyes: Negative for pain, visual disturbance and dryness.  Respiratory: Negative for cough, hemoptysis, shortness of breath and difficulty breathing.   Cardiovascular: Negative for chest pain, palpitations, hypertension and swelling in legs/feet.  Gastrointestinal: Negative for blood in stool, constipation and diarrhea.  Endocrine: Negative for increased urination.  Genitourinary: Negative for painful urination.  Musculoskeletal: Positive for arthralgias, joint pain, joint swelling and morning stiffness. Negative for myalgias, muscle weakness, muscle  tenderness and myalgias.  Skin: Negative for color change, pallor, rash, hair loss, nodules/bumps, skin tightness, ulcers and sensitivity to sunlight.  Allergic/Immunologic: Negative for susceptible to infections.  Neurological: Negative for dizziness, numbness, headaches and weakness.  Hematological: Negative for swollen glands.  Psychiatric/Behavioral: Positive for depressed mood. Negative for sleep disturbance. The patient is not nervous/anxious.     PMFS History:  Patient Active Problem List   Diagnosis Date Noted  . Diabetic peripheral vascular disease ([place]) 09/15/2019  . Gout 07/02/2019  . Insulin dependent type 2 diabetes mellitus ([place]) 07/02/2019  . Acute on chronic diastolic CHF (congestive heart failure) ([place]) 03/26/2018  . Acute on chronic renal insufficiency 03/26/2018  . Dyspnea on exertion 02/14/2018  . Palpitations 02/13/2017  . Chest pain 03/25/2015  . Acute diverticulitis 03/04/2013  . Bilateral lower extremity edema 01/23/2013  . Hyperlipidemia 01/23/2013  . Diverticulitis 01/31/2012  . Diabetes mellitus ([place]) 01/31/2012  . Hypertensive cardiovascular disease 01/31/2012  . Obstructive sleep apnea on CPAP 01/31/2012  . History of TIAs 01/31/2012  . Obesity 01/31/2012  . H/O vertigo 01/31/2012    Past Medical History:  Diagnosis Date  . Anxiety   . Arthritis   . Blood transfusion   . Depression   . Diabetes mellitus   . Edema   . Heart murmur   . Hyperlipidemia   . Hypertension   . Sleep apnea    cpap  . Stroke ([place])    3 x tias  . TIA (transient ischemic attack)     Family History  Problem Relation Age of Onset  .  Healthy Mother   . Prostate cancer Father    Past Surgical History:  Procedure Laterality Date  . ABDOMINAL HYSTERECTOMY    . APPENDECTOMY    . NM MYOVIEW LTD  [id number]   post stress left ventricle is normal in size, ejection fraction is 65%, normal myocardial perfusion study, low risk scan  . PARTIAL HYSTERECTOMY    .  TRANSTHORACIC ECHOCARDIOGRAM  [id number]   Social History   Social History Narrative   Has SCAT   Immunization History  Administered Date(s) Administered  . Influenza, High Dose Seasonal PF 06/25/2018, 06/03/2019  . Pneumococcal Conjugate-13 01/26/2015     Objective: Vital Signs: BP 124/67 (BP Location: Left Wrist, Patient Position: Sitting, Cuff Size: Normal)   Pulse 77   Resp 14   Ht '4\' 11"'  (1.499 m)   Wt 162 lb 3.2 oz (73.6 kg)   BMI 32.76 kg/m    Physical Exam Vitals and nursing note reviewed.  Constitutional:      Appearance: She is well-developed.  HENT:     Head: Normocephalic and atraumatic.  Eyes:     Conjunctiva/sclera: Conjunctivae normal.  Cardiovascular:     Rate and Rhythm: Normal rate and regular rhythm.     Heart sounds: Normal heart sounds.  Pulmonary:     Effort: Pulmonary effort is normal.     Breath sounds: Normal breath sounds.  Abdominal:     General: Bowel sounds are normal.     Palpations: Abdomen is soft.  Musculoskeletal:     Cervical back: Normal range of motion.  Lymphadenopathy:     Cervical: No cervical adenopathy.  Skin:    General: Skin is warm and dry.     Capillary Refill: Capillary refill takes less than 2 seconds.  Neurological:     Mental Status: She is alert and oriented to person, place, and time.  Psychiatric:        Behavior: Behavior normal.      Musculoskeletal Exam: C-spine limited ROM.  Thoracic kyphosis noted.  Shoulder joints good ROM.  Elbow joints good ROM with no discomfort.  Tenderness of both wrist joints.  Synovitis and tenderness of all MCPs and PIP joints.  Hip joints difficult to assess in seated position.  Knee joints limited ROM with discomfort.  No tenderness or inflammation of ankle joints.   CDAI Exam: CDAI Score: -- Patient Global: --; Provider Global: -- Swollen: 20 ; Tender: 24  Joint Exam 10/05/2019      Right  Left  Wrist   Tender   Tender  MCP 1  Swollen Tender  Swollen Tender  MCP 2   Swollen Tender  Swollen Tender  MCP 3  Swollen Tender  Swollen Tender  MCP 4  Swollen Tender  Swollen Tender  MCP 5  Swollen Tender  Swollen Tender  IP  Swollen Tender  Swollen Tender  PIP 2  Swollen Tender  Swollen Tender  PIP 3  Swollen Tender  Swollen Tender  PIP 4  Swollen Tender  Swollen Tender  PIP 5  Swollen Tender  Swollen Tender  Knee   Tender   Tender     Investigation: No additional findings.  Imaging: No results found.  Recent Labs: Lab Results  Component Value Date   WBC 6.7 09/15/2019   HGB 9.2 (L) 09/15/2019   PLT 293 09/15/2019   NA 142 09/15/2019   K 4.2 09/15/2019   CL 99 09/15/2019   CO2 28 09/15/2019   GLUCOSE 150 (H) 09/15/2019  BUN 37 (H) 09/15/2019   CREATININE 1.60 (H) 09/15/2019   BILITOT 0.3 09/15/2019   ALKPHOS 108 09/15/2019   AST 24 09/15/2019   ALT 18 09/15/2019   PROT 6.5 09/15/2019   ALBUMIN 4.0 09/15/2019   CALCIUM 9.1 09/15/2019   GFRAA 32 (L) 09/15/2019    Speciality Comments: No specialty comments available.  Procedures:  No procedures performed Allergies: Ace inhibitors and Valsartan   Assessment / Plan:     Visit Diagnoses: Idiopathic chronic gout of multiple sites without tophus: She presents today with severe pain and inflammation in all MCPs and PIP joints. She has been taking allopurinol 300 mg 1 tablet daily and colchicine 0.6 mg 1 tablet by mouth daily. She has not missed any doses recently.  Her uric acid was 5.5 on 06/11/19.  We will recheck uric acid level today.  She will be starting on low dose prednisone 5 mg 1 tablet daily to manage the inflammation in both hands.  She will continue taking allopurinol and colchicine as prescribed.  She will follow up in 1 month.   Medication monitoring encounter - Allopurinol 300 mg and colchicine 0.6 mg 1 tablet by mouth as needed.uric acid: 06/11/2019 5.5.  CBC and CMP were drawn on 09/15/19.  She has elevated creatinine and low GFR which have been stable.   Pain in both hands -She  presents today with severe tenderness and synovitis of all MCP and PIP joints.  She has tenderness of both wrist joints and incomplete fist formation bilaterally.  She has been working with OT/PT, but she has had difficulty performing exercises due to the pain, stiffness, and joint swelling.  She is having increased difficulty with ADLs, such as injecting insulin daily due to the discomfort she is experiencing.  We will check the following lab work today.  She will be started on low dose prednisone 5 mg 1 tablet daily. She was advised to monitor her blood glucose closely while taking prednisone.  We will call her with lab results and she will follow up in 1 month. Plan: predniSONE (DELTASONE) 5 MG tablet, 14-3-3 eta Protein, Rheumatoid factor, Cyclic citrul peptide antibody, IgG, Sedimentation rate, Uric acid  Trigger finger, left index finger: She experiencing intermittent locking and tenderness.    Primary osteoarthritis of both hands: She has tenderness and synovitis of all MCPs and PIP joints bilaterally.  She has incomplete fist formation.  She has been experiencing severe pain in both hands.  She has had difficulty with ADLs due to the discomfort she is experiencing.  She has been having increased difficulty injecting insulin daily due to the pain and stiffness in both hands.  We will obtain ESR, RF, CCP, uric acid, and 14-3-3 eta.  She will be started on prednisone 5 mg po daily.  She will follow up in 1 month.   Primary osteoarthritis of both knees: She has chronic pain in both knee joints.  No warmth or effusion noted on exam. She was previously working with physical therapy.  She uses a walker to assist with ambulation.    Primary osteoarthritis of both feet: She has no discomfort in her feet at this time.   History of diabetes mellitus, type II: She was advised to monitor her blood glucose closely while taking prednisone.   Other medical conditions are listed as follows:   History of  hyperlipidemia  Acute on chronic renal insufficiency  Hypertensive heart disease with chronic diastolic congestive heart failure (HCC)  History of diverticulitis  Obstructive sleep apnea on CPAP  History of TIAs  Drug therapy - Plan: colchicine 0.6 MG tablet   Orders: Orders Placed This Encounter  Procedures  . 14-3-3 eta Protein  . Rheumatoid factor  . Cyclic citrul peptide antibody, IgG  . Sedimentation rate  . Uric acid   Meds ordered this encounter  Medications  . predniSONE (DELTASONE) 5 MG tablet    Sig: Take 1 tablet (5 mg total) by mouth daily with breakfast.    Dispense:  30 tablet    Refill:  0  . colchicine 0.6 MG tablet    Sig: Take 1 tablet (0.6 mg total) by mouth daily.    Dispense:  90 tablet    Refill:  0    Face-to-face time spent with patient was 30 minutes. Greater than 50% of time was spent in counseling and coordination of care.  Follow-Up Instructions: Return in about 4 weeks (around 11/02/2019) for Gout, Osteoarthritis.   Bo Merino, MD    Scribed by-  Hazel Sams, PA-C  Note - This record has been created using Dragon software.  Chart creation errors have been sought, but may not always  have been located. Such creation errors do not reflect on  the standard of medical care.

## 2019-10-05 NOTE — Patient Instructions (Signed)
Take prednisone 5 mg 1 tablet by mouth daily.  Monitor blood sugar closely.   Continue taking allopurinol 300 mg 1 tablet by mouth daily Continue taking colchicine 0.6 mg 1 tablet by mouth daily. We will call you with lab results.  Follow up in 1 month.

## 2019-10-05 NOTE — Chronic Care Management (AMB) (Signed)
  Chronic Care Management   Outreach Note  10/05/2019 Name: Maria [name] MRN: [id number] DOB: Feb 10, 1926  Referred by: Glendale Chard, MD Reason for referral : Care Coordination  SW placed a second unsuccessful outbound call to the patient to assist with care coordination needs. SW left a HIPAA compliant voice message requesting a return call.   Follow Up Plan: SW will attempt a third and final call attempt over the next two weeks.  Daneen Schick, BSW, CDP Social Worker, Certified Dementia Practitioner [facility] [phone]

## 2019-10-06 ENCOUNTER — Ambulatory Visit: Payer: Medicare Other | Admitting: Rheumatology

## 2019-10-07 NOTE — Patient Instructions (Signed)

## 2019-10-11 LAB — URIC ACID: Uric Acid, Serum: 4.1 mg/dL (ref 2.5–7.0)

## 2019-10-11 LAB — CYCLIC CITRUL PEPTIDE ANTIBODY, IGG: Cyclic Citrullin Peptide Ab: <16 UNITS

## 2019-10-11 LAB — 14-3-3 ETA PROTEIN: 14-3-3 eta Protein: <0.2 ng/mL (ref <0.2)

## 2019-10-11 LAB — RHEUMATOID FACTOR: Rheumatoid fact SerPl-aCnc: <14 IU/mL (ref <14)

## 2019-10-11 LAB — SEDIMENTATION RATE: Sed Rate: 48 mm/h — ABNORMAL HIGH (ref 0–30)

## 2019-10-12 NOTE — Progress Notes (Signed)
14-3-3 eta, RF, and anti-CCP negative.  ESR is elevated-48.  Uric acid WNL.  The patient likely has seronegative rheumatoid arthritis.  Please schedule a sooner office visit (if possible on a Tues or Thurs) to discuss possible treatment options.

## 2019-10-13 NOTE — Progress Notes (Signed)
Office Visit Note  Patient: Maria [name]             Date of Birth: 05/15/1926           MRN: [id number]             PCP: Glendale Chard, MD Referring: Glendale Chard, MD Visit Date: 10/15/2019 Occupation: @GUAROCC @  Subjective:  Discussed lab work  History of Present Illness: HILDAGARD CORRADINI is a 86 y.o. female with history of seronegative rheumatoid arthritis, gout, and osteoarthritis.  She has persistent pain and inflammation in bilateral hands.  She is currently taking prednisone 5 mg 1 tablet by mouth daily.  She has noticed improvement in the joint swelling and tenderness in her hands and starting prednisone.  She states that her glucose has been elevated while taking prednisone.  She denies any gout flares since her last visit.  She continues to take allopurinol 300 mg 1 tablet by mouth daily and colchicine 0.6 mg 1 tablet daily.    Activities of Daily Living:  Patient reports joint stiffness all day. Patient Reports nocturnal pain.  Difficulty dressing/grooming: Denies Difficulty climbing stairs: Reports Difficulty getting out of chair: Denies Difficulty using hands for taps, buttons, cutlery, and/or writing: Reports  Review of Systems  Constitutional: Positive for fatigue.  HENT: Negative for mouth sores, mouth dryness and nose dryness.   Eyes: Negative for itching and dryness.  Respiratory: Negative for shortness of breath and difficulty breathing.   Cardiovascular: Negative for chest pain and palpitations.  Gastrointestinal: Negative for blood in stool, constipation and diarrhea.  Endocrine: Negative for increased urination.  Genitourinary: Negative for difficulty urinating and painful urination.  Musculoskeletal: Positive for arthralgias, joint pain and morning stiffness. Negative for joint swelling.  Skin: Negative for rash.  Allergic/Immunologic: Negative for susceptible to infections.  Neurological: Positive for numbness and weakness. Negative for dizziness,  headaches and memory loss.  Hematological: Negative for bruising/bleeding tendency.  Psychiatric/Behavioral: Negative for confusion and sleep disturbance.    PMFS History:  Patient Active Problem List   Diagnosis Date Noted  . Diabetic peripheral vascular disease ([place]) 09/15/2019  . Gout 07/02/2019  . Insulin dependent type 2 diabetes mellitus ([place]) 07/02/2019  . Acute on chronic diastolic CHF (congestive heart failure) ([place]) 03/26/2018  . Acute on chronic renal insufficiency 03/26/2018  . Dyspnea on exertion 02/14/2018  . Palpitations 02/13/2017  . Chest pain 03/25/2015  . Acute diverticulitis 03/04/2013  . Bilateral lower extremity edema 01/23/2013  . Hyperlipidemia 01/23/2013  . Diverticulitis 01/31/2012  . Diabetes mellitus ([place]) 01/31/2012  . Hypertensive cardiovascular disease 01/31/2012  . Obstructive sleep apnea on CPAP 01/31/2012  . History of TIAs 01/31/2012  . Obesity 01/31/2012  . H/O vertigo 01/31/2012    Past Medical History:  Diagnosis Date  . Anxiety   . Arthritis   . Blood transfusion   . Depression   . Diabetes mellitus   . Edema   . Heart murmur   . Hyperlipidemia   . Hypertension   . Sleep apnea    cpap  . Stroke ([place])    3 x tias  . TIA (transient ischemic attack)     Family History  Problem Relation Age of Onset  . Healthy Mother   . Prostate cancer Father    Past Surgical History:  Procedure Laterality Date  . ABDOMINAL HYSTERECTOMY    . APPENDECTOMY    . NM MYOVIEW LTD  [id number]   post stress left ventricle is normal in size,  ejection fraction is 65%, normal myocardial perfusion study, low risk scan  . PARTIAL HYSTERECTOMY    . TRANSTHORACIC ECHOCARDIOGRAM  [id number]   Social History   Social History Narrative   Has SCAT   Immunization History  Administered Date(s) Administered  . Influenza, High Dose Seasonal PF 06/25/2018, 06/03/2019  . Pneumococcal Conjugate-13 01/26/2015     Objective: Vital Signs: BP 123/62 (BP Location:  Left Arm, Patient Position: Sitting, Cuff Size: Normal)   Pulse 70   Resp 13   Ht 4\' 11"  (1.499 m)   Wt 162 lb 3.2 oz (73.6 kg)   BMI 32.76 kg/m    Physical Exam Vitals and nursing note reviewed.  Constitutional:      Appearance: She is well-developed.  HENT:     Head: Normocephalic and atraumatic.  Eyes:     Conjunctiva/sclera: Conjunctivae normal.  Cardiovascular:     Rate and Rhythm: Normal rate and regular rhythm.     Heart sounds: Normal heart sounds.  Pulmonary:     Effort: Pulmonary effort is normal.     Breath sounds: Normal breath sounds.  Abdominal:     General: Bowel sounds are normal.     Palpations: Abdomen is soft.  Musculoskeletal:     Cervical back: Normal range of motion.  Lymphadenopathy:     Cervical: No cervical adenopathy.  Skin:    General: Skin is warm and dry.     Capillary Refill: Capillary refill takes less than 2 seconds.  Neurological:     Mental Status: She is alert and oriented to person, place, and time.  Psychiatric:        Behavior: Behavior normal.      Musculoskeletal Exam: C-spine limited range of motion.  Thoracic kyphosis noted.  Lumbar spine range of motion difficult to assess in seated position.  Shoulder joint abduction to 120 degrees.  Elbow joints good ROM.  Wrist joints limited ROM with discomfort.  Tenderness of both wrist joints. She has tenderness and synovial thickening of all MCP joints.  Synovitis of right 2nd and 3rd MCP joints.  Hip joints difficult to assess in seated position.  Knee joints have mild warmth but no effusion.  She has good range of motion in both knees.  Ankle joints have pedal edema bilaterally  CDAI Exam: CDAI Score: 9.2  Patient Global: 6 mm; Provider Global: 6 mm Swollen: 2 ; Tender: 6  Joint Exam 10/15/2019      Right  Left  Wrist   Tender   Tender  MCP 2  Swollen Tender   Tender  MCP 3  Swollen Tender   Tender     Investigation: No additional findings.  Imaging: No results  found.  Recent Labs: Lab Results  Component Value Date   WBC 6.7 09/15/2019   HGB 9.2 (L) 09/15/2019   PLT 293 09/15/2019   NA 142 09/15/2019   K 4.2 09/15/2019   CL 99 09/15/2019   CO2 28 09/15/2019   GLUCOSE 150 (H) 09/15/2019   BUN 37 (H) 09/15/2019   CREATININE 1.60 (H) 09/15/2019   BILITOT 0.3 09/15/2019   ALKPHOS 108 09/15/2019   AST 24 09/15/2019   ALT 18 09/15/2019   PROT 6.5 09/15/2019   ALBUMIN 4.0 09/15/2019   CALCIUM 9.1 09/15/2019   GFRAA 32 (L) 09/15/2019    Speciality Comments: No specialty comments available.  Procedures:  No procedures performed Allergies: Ace inhibitors and Valsartan   Assessment / Plan:     Visit Diagnoses: Seronegative  rheumatoid arthritis ([place]): She has tenderness and synovitis of the right second and third MCP joints.  She synovial thickening of all MCPs.  She has mild warmth of both knee joints but no effusion noted.  She has had a significant improvement in the pain and inflammation in both hands and started on prednisone 5 mg 1 tablet by mouth daily.  We reviewed lab work from 10/05/2019.  RF negative, anti-CCP negative, 14 3 3  eta negative, sed rate is elevated at 45.  We discussed that she most likely has seronegative rheumatoid arthritis.  The diagnosis of rheumatoid arthritis as well as treatment options were discussed.  She will be starting on low-dose Plaquenil 200 mg 1 tablet by mouth daily.  Her GFR was 32 on 09/15/2019.  We will continue to monitor lab work closely.  Indications, contraindications, potential side effects of Plaquenil were discussed.  All questions were addressed and consent was obtained today.  We will send in a prescription for Plaquenil 20 mg 1 tablet by mouth daily.  We will reduce the dose of prednisone to 2.5 mg daily to help with the joint inflammation.  She was advised to monitor her blood glucose closely while taking prednisone.  She is advised to notify us if she cannot tolerate taking Plaquenil.  She will  follow-up in the office in 6 weeks.  High risk medication use: Plaquenil 200 mg 1 tablet by mouth daily.  CBC and CMP were drawn on 09/15/2019.  She will be due to update lab work in 1 month in 3 months and every 5 months.  Standing orders are in place.  She will require baseline Plaquenil eye exam.  She was given a Plaquenil eye exam form to take with her to her upcoming appointment.  Idiopathic chronic gout of multiple sites without tophus -She has not had any signs or symptoms of a gout flare recently.  She is clinically doing well on allopurinol 300 mg 1 tablet by mouth daily and colchicine 0.6 mg 1 tablet by mouth daily.  Her uric acid was 4.1 on 10/05/2019.  She will continue taking allopurinol colchicine as prescribed.  She does not need any refills at this time.  She was advised to notify us if she develops signs or symptoms of a gout flare.  Trigger finger, left index finger: She experiences intermittent locking and tenderness.  Primary osteoarthritis of both hands: She has PIP and DIP synovial thickening consistent with osteoarthritis of both hands.  Joint protection and muscle strengthening were discussed.  Primary osteoarthritis of both knees: She has chronic pain in bilateral knee joints.  She has warmth but no effusion on exam today.  She walks with a walker to assist with ambulation.  Primary osteoarthritis of both feet: She is not experiencing any feet pain at this time.  Other medical conditions are listed as follows:  History of diabetes mellitus, type II  History of hyperlipidemia  Acute on chronic renal insufficiency  Hypertensive heart disease with chronic diastolic congestive heart failure (HCC)  History of diverticulitis  Obstructive sleep apnea on CPAP  History of TIAs  Orders: No orders of the defined types were placed in this encounter.  No orders of the defined types were placed in this encounter.   Face-to-face time spent with patient was 30 minutes.  Greater than 50% of time was spent in counseling and coordination of care.  Follow-Up Instructions: Return in about 6 weeks (around 11/26/2019) for Rheumatoid arthritis, Gout.   Ofilia Neas, PA-C  Note - This record has been created using Dragon software.  Chart creation errors have been sought, but may not always  have been located. Such creation errors do not reflect on  the standard of medical care.  

## 2019-10-15 ENCOUNTER — Encounter: Payer: Self-pay | Admitting: Physician Assistant

## 2019-10-15 ENCOUNTER — Ambulatory Visit: Payer: Medicare Other | Admitting: Physician Assistant

## 2019-10-15 ENCOUNTER — Other Ambulatory Visit: Payer: Self-pay

## 2019-10-15 VITALS — BP 123/62 | HR 70 | Resp 13 | Ht 59.0 in | Wt 162.2 lb

## 2019-10-15 DIAGNOSIS — M0600 Rheumatoid arthritis without rheumatoid factor, unspecified site: Secondary | ICD-10-CM | POA: Diagnosis not present

## 2019-10-15 DIAGNOSIS — I110 Hypertensive heart disease with heart failure: Secondary | ICD-10-CM

## 2019-10-15 DIAGNOSIS — N289 Disorder of kidney and ureter, unspecified: Secondary | ICD-10-CM

## 2019-10-15 DIAGNOSIS — Z79899 Other long term (current) drug therapy: Secondary | ICD-10-CM | POA: Diagnosis not present

## 2019-10-15 DIAGNOSIS — Z5181 Encounter for therapeutic drug level monitoring: Secondary | ICD-10-CM

## 2019-10-15 DIAGNOSIS — Z8673 Personal history of transient ischemic attack (TIA), and cerebral infarction without residual deficits: Secondary | ICD-10-CM

## 2019-10-15 DIAGNOSIS — M19042 Primary osteoarthritis, left hand: Secondary | ICD-10-CM

## 2019-10-15 DIAGNOSIS — M19071 Primary osteoarthritis, right ankle and foot: Secondary | ICD-10-CM

## 2019-10-15 DIAGNOSIS — Z8719 Personal history of other diseases of the digestive system: Secondary | ICD-10-CM

## 2019-10-15 DIAGNOSIS — M1A09X0 Idiopathic chronic gout, multiple sites, without tophus (tophi): Secondary | ICD-10-CM | POA: Diagnosis not present

## 2019-10-15 DIAGNOSIS — Z8639 Personal history of other endocrine, nutritional and metabolic disease: Secondary | ICD-10-CM

## 2019-10-15 DIAGNOSIS — M19041 Primary osteoarthritis, right hand: Secondary | ICD-10-CM

## 2019-10-15 DIAGNOSIS — I5032 Chronic diastolic (congestive) heart failure: Secondary | ICD-10-CM

## 2019-10-15 DIAGNOSIS — M170 Bilateral primary osteoarthritis of knee: Secondary | ICD-10-CM

## 2019-10-15 DIAGNOSIS — Z9989 Dependence on other enabling machines and devices: Secondary | ICD-10-CM

## 2019-10-15 DIAGNOSIS — N189 Chronic kidney disease, unspecified: Secondary | ICD-10-CM

## 2019-10-15 DIAGNOSIS — M65322 Trigger finger, left index finger: Secondary | ICD-10-CM | POA: Diagnosis not present

## 2019-10-15 DIAGNOSIS — G4733 Obstructive sleep apnea (adult) (pediatric): Secondary | ICD-10-CM

## 2019-10-15 DIAGNOSIS — M19072 Primary osteoarthritis, left ankle and foot: Secondary | ICD-10-CM

## 2019-10-15 MED ORDER — HYDROXYCHLOROQUINE SULFATE 200 MG PO TABS: 200.0000 mg | ORAL_TABLET | Freq: Every day | ORAL | 2 refills | Status: DC

## 2019-10-15 MED ORDER — PREDNISONE 2.5 MG PO TABS: 2.5000 mg | ORAL_TABLET | Freq: Every day | ORAL | 0 refills | Status: DC

## 2019-10-15 NOTE — Patient Instructions (Signed)
Standing Labs We placed an order today for your standing lab work.    Please come back and get your standing labs in 1 month, then 3 months, then every 5 months   We have open lab daily Monday through Thursday from 8:30-12:30 PM and 1:30-4:30 PM and Friday from 8:30-12:30 PM and 1:30-4:00 PM at the office of Dr. Bo Merino.   You may experience shorter wait times on Monday and Friday afternoons. The office is located at [address] [place], [place], [place] [address] No appointment is necessary.   Labs are drawn by [facility].  You may receive a bill from [facility] for your lab work.  If you wish to have your labs drawn at another location, please call the office 24 hours in advance to send orders.  If you have any questions regarding directions or hours of operation,  please call [phone].   Just as a reminder please drink plenty of water prior to coming for your lab work. Thanks!      Hydroxychloroquine tablets What is this medicine? HYDROXYCHLOROQUINE (hye drox ee KLOR oh kwin) is used to treat rheumatoid arthritis and systemic lupus erythematosus. It is also used to treat malaria. This medicine may be used for other purposes; ask your health care provider or pharmacist if you have questions. COMMON BRAND NAME(S): Plaquenil, Quineprox What should I tell my health care provider before I take this medicine? They need to know if you have any of these conditions:  diabetes  eye disease, vision problems  G6PD deficiency  heart disease  history of irregular heartbeat  if you often drink alcohol  kidney disease  liver disease  porphyria  psoriasis  an unusual or allergic reaction to chloroquine, hydroxychloroquine, other medicines, foods, dyes, or preservatives  pregnant or trying to get pregnant  breast-feeding How should I use this medicine? Take this medicine by mouth with a glass of water. Follow the directions on the prescription label. Do not cut,  crush or chew this medicine. Swallow the tablets whole. Take this medicine with food. Avoid taking antacids within 4 hours of taking this medicine. It is best to separate these medicines by at least 4 hours. Take your medicine at regular intervals. Do not take it more often than directed. Take all of your medicine as directed even if you think you are better. Do not skip doses or stop your medicine early. Talk to your pediatrician regarding the use of this medicine in children. While this drug may be prescribed for selected conditions, precautions do apply. Overdosage: If you think you have taken too much of this medicine contact a poison control center or emergency room at once. NOTE: This medicine is only for you. Do not share this medicine with others. What if I miss a dose? If you miss a dose, take it as soon as you can. If it is almost time for your next dose, take only that dose. Do not take double or extra doses. What may interact with this medicine? Do not take this medicine with any of the following medications:  cisapride  dronedarone  pimozide  thioridazine This medicine may also interact with the following medications:  ampicillin  antacids  cimetidine  cyclosporine  digoxin  kaolin  medicines for diabetes, like insulin, glipizide, glyburide  medicines for seizures like carbamazepine, phenobarbital, phenytoin  mefloquine  methotrexate  other medicines that prolong the QT interval (cause an abnormal heart rhythm)  praziquantel This list may not describe all possible interactions. Give  your health care provider a list of all the medicines, herbs, non-prescription drugs, or dietary supplements you use. Also tell them if you smoke, drink alcohol, or use illegal drugs. Some items may interact with your medicine. What should I watch for while using this medicine? Visit your health care professional for regular checks on your progress. Tell your health care  professional if your symptoms do not start to get better or if they get worse. You may need blood work done while you are taking this medicine. If you take other medicines that can affect heart rhythm, you may need more testing. Talk to your health care professional if you have questions. Your vision may be tested before and during use of this medicine. Tell your health care professional right away if you have any change in your eyesight. What side effects may I notice from receiving this medicine? Side effects that you should report to your doctor or health care professional as soon as possible:  allergic reactions like skin rash, itching or hives, swelling of the face, lips, or tongue  changes in vision  decreased hearing or ringing of the ears  muscle weakness  redness, blistering, peeling or loosening of the skin, including inside the mouth  sensitivity to light  signs and symptoms of a dangerous change in heartbeat or heart rhythm like chest pain; dizziness; fast or irregular heartbeat; palpitations; feeling faint or lightheaded, falls; breathing problems  signs and symptoms of liver injury like dark yellow or brown urine; general ill feeling or flu-like symptoms; light-colored stools; loss of appetite; nausea; right upper belly pain; unusually weak or tired; yellowing of the eyes or skin  signs and symptoms of low blood sugar such as feeling anxious; confusion; dizziness; increased hunger; unusually weak or tired; sweating; shakiness; cold; irritable; headache; blurred vision; fast heartbeat; loss of consciousness  suicidal thoughts  uncontrollable head, mouth, neck, arm, or leg movements Side effects that usually do not require medical attention (report to your doctor or health care professional if they continue or are bothersome):  diarrhea  dizziness  hair loss  headache  irritable  loss of appetite  nausea, vomiting  stomach pain This list may not describe all  possible side effects. Call your doctor for medical advice about side effects. You may report side effects to FDA at 1-800-FDA-1088. Where should I keep my medicine? Keep out of the reach of children. Store at room temperature between 15 and 30 degrees C (59 and 86 degrees F). Protect from moisture and light. Throw away any unused medicine after the expiration date. NOTE: This sheet is a summary. It may not cover all possible information. If you have questions about this medicine, talk to your doctor, pharmacist, or health care provider.  2020 Elsevier/Gold Standard (2019-01-05 12:56:32)

## 2019-10-15 NOTE — Addendum Note (Signed)
Addended by: Earnestine Mealing on: 10/15/2019 12:46 PM   Modules accepted: Orders

## 2019-10-16 ENCOUNTER — Ambulatory Visit: Payer: Medicare Other | Admitting: Rheumatology

## 2019-10-19 ENCOUNTER — Telehealth: Payer: Self-pay | Admitting: Rheumatology

## 2019-10-19 DIAGNOSIS — M00042 Staphylococcal arthritis, left hand: Secondary | ICD-10-CM | POA: Diagnosis not present

## 2019-10-19 DIAGNOSIS — M10042 Idiopathic gout, left hand: Secondary | ICD-10-CM | POA: Diagnosis not present

## 2019-10-19 MED ORDER — PREDNISONE 2.5 MG PO TABS: 5.0000 mg | ORAL_TABLET | Freq: Every day | ORAL | 0 refills | Status: DC

## 2019-10-19 NOTE — Telephone Encounter (Signed)
Patient states right hand has continued to swell over the weekend, and she is unable to  use hand. She is having a lot of pain. Patient has been taking medication as advised with 1/2 a pill of Prednisone. Please call to advise.

## 2019-10-19 NOTE — Telephone Encounter (Signed)
She should go back on prednisone 5 mg p.o. daily.

## 2019-10-19 NOTE — Telephone Encounter (Signed)
Patient was previously taking prednisone 5mg  and decreased prednisone 2.5mg  on Friday 10/16/2019 . patient started PLQ on Thursday 10/15/2019. Patient states her right hand is very painful and swollen, she is unable to cook and perform basic tasks with her right hand. Patient has tried "soaking" her hand for relief and has been unsuccessful. Please advise.   Last visit: 10/15/2019.

## 2019-10-19 NOTE — Telephone Encounter (Signed)
Advised patient She should go back on prednisone 5 mg p.o. daily. Patient verbalized understanding. Patient states she does not need a new prescription since she picked up the 2.5mg  prescription on Thursday. Patient is aware to take 5mg  daily. (dose change reflected as no print rx).

## 2019-10-20 ENCOUNTER — Telehealth: Payer: Self-pay

## 2019-10-20 ENCOUNTER — Ambulatory Visit: Payer: Self-pay

## 2019-10-20 DIAGNOSIS — Z794 Long term (current) use of insulin: Secondary | ICD-10-CM

## 2019-10-20 DIAGNOSIS — E1122 Type 2 diabetes mellitus with diabetic chronic kidney disease: Secondary | ICD-10-CM

## 2019-10-20 NOTE — Chronic Care Management (AMB) (Signed)
  Chronic Care Management   Outreach Note  10/20/2019 Name: MARTINEZ HENAULT MRN: [id number] DOB: Apr 17, 1926  Referred by: Glendale Chard, MD Reason for referral : Care Coordination   SW placed a third unsuccessful outbound call to the patient to assist with care coordination needs. SW left a HIPAA compliant voice message requesting a return call.  Follow Up Plan: No further SW follow up planned at this time. The patient will remain active with RN Case Manager and embedded PharmD.  Daneen Schick, BSW, CDP Social Worker, Certified Dementia Practitioner [facility] [phone]

## 2019-10-21 ENCOUNTER — Telehealth: Payer: Self-pay | Admitting: Pharmacist

## 2019-10-26 ENCOUNTER — Telehealth: Payer: Self-pay | Admitting: Rheumatology

## 2019-10-26 MED ORDER — PREDNISONE 2.5 MG PO TABS: 7.5000 mg | ORAL_TABLET | Freq: Every day | ORAL | 0 refills | Status: DC

## 2019-10-26 NOTE — Telephone Encounter (Signed)
Patient states both hands are very painful, the right is worse. Patient reports her fingers are swollen and there is a knot on the 2nd digit on her right hand. Patient is taking plaquenil 200mg  daily and prednisone 5mg  daily. Patient is having extreme difficulty using her hands. She is going to attempt to put a brace on her left hand to help with the pain. Patient states she is aware the plaquenil will take a while to start working but she would like to know what else she can do in the meantime. Please advise.

## 2019-10-26 NOTE — Telephone Encounter (Signed)
She may increase prednisone to 7.5 mg p.o. daily.  Another option could be to add leflunomide to Plaquenil.  If patient is interested in starting the leflunomide we can schedule an appointment.

## 2019-10-26 NOTE — Telephone Encounter (Signed)
Patient called stating her hands are still very painful.  Patient requested to speak directly with the Dr. Arlean Hopping assistant "so she can explain what is going on with her hands since changing medications."

## 2019-10-26 NOTE — Telephone Encounter (Signed)
Advised patient She may increase prednisone to 7.5 mg p.o. daily.  Another option could be to add leflunomide to Plaquenil.  If patient is interested in starting the leflunomide we can schedule an appointment. Patent verbalized understanding and patient will increase the prednisone and discuss leflunomide at the follow up she has scheduled. Prednisone has been refilled.

## 2019-10-27 ENCOUNTER — Telehealth: Payer: Self-pay | Admitting: Cardiovascular Disease

## 2019-10-27 NOTE — Telephone Encounter (Signed)
Spoke with [name] at [facility]. Patient has been on Telmisartan since August of 2020 without any issues. Okay to refill medication.

## 2019-10-27 NOTE — Telephone Encounter (Signed)
Patient on telmisartan since August 2020. Okay to continue if an]ble to tolerate.

## 2019-10-27 NOTE — Telephone Encounter (Signed)
Pt c/o medication issue:  1. Name of Medication: telmisartan (MICARDIS) 80 MG tablet  2. How are you currently taking this medication (dosage and times per day)? Once a day  3. Are you having a reaction (difficulty breathing--STAT)? no  4. What is your medication issue? Vicky from [facility] calling stating the patient is allergic to valsartan. They would like to know if it would be okay for the patient to take telmisartan. She can be reached at [phone]

## 2019-10-30 ENCOUNTER — Ambulatory Visit: Payer: Medicare Other | Admitting: Rheumatology

## 2019-11-04 ENCOUNTER — Ambulatory Visit: Payer: Medicare Other | Admitting: Podiatry

## 2019-11-04 ENCOUNTER — Other Ambulatory Visit: Payer: Self-pay

## 2019-11-04 ENCOUNTER — Encounter: Payer: Self-pay | Admitting: Podiatry

## 2019-11-04 DIAGNOSIS — B351 Tinea unguium: Secondary | ICD-10-CM | POA: Diagnosis not present

## 2019-11-04 DIAGNOSIS — E1151 Type 2 diabetes mellitus with diabetic peripheral angiopathy without gangrene: Secondary | ICD-10-CM | POA: Diagnosis not present

## 2019-11-04 DIAGNOSIS — M2042 Other hammer toe(s) (acquired), left foot: Secondary | ICD-10-CM

## 2019-11-04 DIAGNOSIS — M79676 Pain in unspecified toe(s): Secondary | ICD-10-CM

## 2019-11-04 DIAGNOSIS — M2011 Hallux valgus (acquired), right foot: Secondary | ICD-10-CM | POA: Insufficient documentation

## 2019-11-04 DIAGNOSIS — M2041 Other hammer toe(s) (acquired), right foot: Secondary | ICD-10-CM

## 2019-11-04 NOTE — Progress Notes (Signed)
Complaint:  Visit Type: Patient returns to my office for continued preventative foot care services. Complaint: Patient states" my nails have grown long and thick and become painful to walk and wear shoes" Patient has been diagnosed with DM with neuropathy. The patient presents for preventative foot care services. No changes to ROS.  Painful big toe joint right foot. Podiatric Exam: Vascular: dorsalis pedis and posterior tibial pulses are palpable bilateral. Capillary return is immediate. Temperature gradient is WNL. Skin turgor WNL  Sensorium: Diminished  Semmes Weinstein monofilament test. Normal tactile sensation bilaterally. Nail Exam: Pt has thick disfigured discolored nails with subungual debris noted bilateral entire nail hallux through fifth toenails Ulcer Exam: There is no evidence of ulcer or pre-ulcerative changes or infection. HAV 1st MPJ right foot. Orthopedic Exam: Muscle tone and strength are WNL. No limitations in general ROM. No crepitus or effusions noted. Foot type and digits show no abnormalities. Bony prominences are unremarkable.Hammer toes  2,3  B/L  HAV 1st MPJ right foot. Skin: No Porokeratosis. No infection or ulcers  Diagnosis:  Onychomycosis, , Pain in right toe, pain in left toes  Treatment & Plan Procedures and Treatment: Consent by patient was obtained for treatment procedures.   Debridement of mycotic and hypertrophic toenails, 1 through 5 bilateral and clearing of subungual debris. No ulceration, no infection noted. Discussed inflamed bunion right foot.  Recommend soaks and soft shoes.  Consider injection therapy if problem persists. Return Visit-Office Procedure: Patient instructed to return to the office for a follow up visit 10 weeks  for continued evaluation and treatment.    Gardiner Barefoot DPM

## 2019-11-10 ENCOUNTER — Other Ambulatory Visit: Payer: Self-pay

## 2019-11-10 MED ORDER — ACCU-CHEK FASTCLIX LANCETS MISC: 1 refills | Status: DC

## 2019-11-16 ENCOUNTER — Other Ambulatory Visit: Payer: Self-pay | Admitting: Cardiovascular Disease

## 2019-11-16 ENCOUNTER — Other Ambulatory Visit: Payer: Self-pay

## 2019-11-16 ENCOUNTER — Telehealth: Payer: Self-pay

## 2019-11-16 ENCOUNTER — Ambulatory Visit: Payer: Self-pay

## 2019-11-16 ENCOUNTER — Other Ambulatory Visit: Payer: Self-pay | Admitting: Internal Medicine

## 2019-11-16 DIAGNOSIS — E782 Mixed hyperlipidemia: Secondary | ICD-10-CM

## 2019-11-16 DIAGNOSIS — N183 Chronic kidney disease, stage 3 unspecified: Secondary | ICD-10-CM

## 2019-11-16 DIAGNOSIS — Z794 Long term (current) use of insulin: Secondary | ICD-10-CM

## 2019-11-16 DIAGNOSIS — E1122 Type 2 diabetes mellitus with diabetic chronic kidney disease: Secondary | ICD-10-CM

## 2019-11-16 DIAGNOSIS — I5032 Chronic diastolic (congestive) heart failure: Secondary | ICD-10-CM

## 2019-11-16 DIAGNOSIS — I129 Hypertensive chronic kidney disease with stage 1 through stage 4 chronic kidney disease, or unspecified chronic kidney disease: Secondary | ICD-10-CM

## 2019-11-17 NOTE — Chronic Care Management (AMB) (Signed)
  Chronic Care Management   Outreach Note  11/17/2019 Name: Maria [name] MRN: [id number] DOB: [birth year]  Referred by: Glendale Chard, MD Reason for referral : Chronic Care Management ([facility] Call - DM, CHF, CKD, RA )   An unsuccessful telephone outreach was attempted today. The patient was referred to the case management team for assistance with care management and care coordination.   Follow Up Plan: Telephone follow up appointment with care management team member scheduled for: 12/21/19  Barb Merino, RN, BSN, CCM Care Management Coordinator [facility]  Direct Phone: [phone]

## 2019-11-20 NOTE — Progress Notes (Signed)
Office Visit Note  Patient: CHERICA [name]             Date of Birth: 11/16/1925           MRN: [id number]             PCP: Glendale Chard, MD Referring: Glendale Chard, MD Visit Date: 11/26/2019 Occupation: @GUAROCC @  Subjective:  Pain in both hands   History of Present Illness: Maria [name] is a 86 y.o. female with history of seronegative rheumatoid arthritis, osteoarthritis, and gout.  Patient is taking Plaquenil 200 mg 1 tablet by mouth daily which she started about 6 weeks ago.  She is tolerating Plaquenil without any side effects.  She continues to take prednisone 7.5 mg daily. She states she started noticing improvement on prednisone and Plaquenil but continues to have significant discomfort in both hands and her right knee.  She is able to make a complete fist which she has been unable to do for a very long time.  She has not started back at physical therapy yet.  She denies any recent gout flares.  She continues to take allopurinol 300 mg daily and colchicine 0.6 mg 1 tablet daily. She would like refills of all of these medications.   Activities of Daily Living:  Patient reports joint stiffness all day  Patient Denies nocturnal pain.  Difficulty dressing/grooming: Denies Difficulty climbing stairs: Reports Difficulty getting out of chair: Denies Difficulty using hands for taps, buttons, cutlery, and/or writing: Reports  Review of Systems  Constitutional: Positive for fatigue.  HENT: Negative for mouth sores, mouth dryness and nose dryness.   Eyes: Negative for itching and dryness.  Respiratory: Negative for shortness of breath and difficulty breathing.   Cardiovascular: Negative for chest pain and palpitations.  Gastrointestinal: Negative for blood in stool, constipation and diarrhea.  Endocrine: Negative for increased urination.  Genitourinary: Negative for difficulty urinating and painful urination.  Musculoskeletal: Positive for arthralgias, gait problem, joint  pain, joint swelling and morning stiffness.  Skin: Negative for rash.  Allergic/Immunologic: Negative for susceptible to infections.  Neurological: Positive for numbness and weakness. Negative for dizziness, headaches and memory loss.  Hematological: Negative for bruising/bleeding tendency.  Psychiatric/Behavioral: Negative for confusion.    PMFS History:  Patient Active Problem List   Diagnosis Date Noted  . Hav (hallux abducto valgus), right 11/04/2019  . Diabetic peripheral vascular disease ([place]) 09/15/2019  . Gout 07/02/2019  . Insulin dependent type 2 diabetes mellitus ([place]) 07/02/2019  . Acute on chronic diastolic CHF (congestive heart failure) ([place]) 03/26/2018  . Acute on chronic renal insufficiency 03/26/2018  . Dyspnea on exertion 02/14/2018  . Palpitations 02/13/2017  . Chest pain 03/25/2015  . Acute diverticulitis 03/04/2013  . Bilateral lower extremity edema 01/23/2013  . Hyperlipidemia 01/23/2013  . Diverticulitis 01/31/2012  . Diabetes mellitus ([place]) 01/31/2012  . Hypertensive cardiovascular disease 01/31/2012  . Obstructive sleep apnea on CPAP 01/31/2012  . History of TIAs 01/31/2012  . Obesity 01/31/2012  . H/O vertigo 01/31/2012    Past Medical History:  Diagnosis Date  . Anxiety   . Arthritis   . Blood transfusion   . Depression   . Diabetes mellitus   . Edema   . Heart murmur   . Hyperlipidemia   . Hypertension   . Sleep apnea    cpap  . Stroke ([place])    3 x tias  . TIA (transient ischemic attack)     Family History  Problem Relation Age of Onset  .  Healthy Mother   . Prostate cancer Father    Past Surgical History:  Procedure Laterality Date  . ABDOMINAL HYSTERECTOMY    . APPENDECTOMY    . NM MYOVIEW LTD  [id number]   post stress left ventricle is normal in size, ejection fraction is 65%, normal myocardial perfusion study, low risk scan  . PARTIAL HYSTERECTOMY    . TRANSTHORACIC ECHOCARDIOGRAM  [id number]   Social History   Social History  Narrative   Has SCAT   Immunization History  Administered Date(s) Administered  . Influenza, High Dose Seasonal PF 06/25/2018, 06/03/2019  . Pneumococcal Conjugate-13 01/26/2015     Objective: Vital Signs: BP 136/72 (BP Location: Left Arm, Patient Position: Sitting, Cuff Size: Normal)   Pulse (!) 59   Resp 14   Ht 4\' 11"  (1.499 m)   Wt 160 lb (72.6 kg)   BMI 32.32 kg/m    Physical Exam Vitals and nursing note reviewed.  Constitutional:      Appearance: She is well-developed.  HENT:     Head: Normocephalic and atraumatic.  Eyes:     Conjunctiva/sclera: Conjunctivae normal.  Pulmonary:     Effort: Pulmonary effort is normal.  Abdominal:     General: Bowel sounds are normal.     Palpations: Abdomen is soft.  Musculoskeletal:     Cervical back: Normal range of motion.  Lymphadenopathy:     Cervical: No cervical adenopathy.  Skin:    General: Skin is warm and dry.     Capillary Refill: Capillary refill takes less than 2 seconds.  Neurological:     Mental Status: She is alert and oriented to person, place, and time.  Psychiatric:        Behavior: Behavior normal.      Musculoskeletal Exam: C-spine limited range of motion.  Thoracic kyphosis noted.  Lumbar spine range of motion difficult to assess well in seated position.  Shoulder joints have full range of motion with discomfort bilaterally today.  Elbow joints have good range of motion with no tenderness or synovitis.  She has limited range of motion of both wrist joints.  She has synovitis and tenderness of the right second, third, and fourth MCP joints and right third PIP.  Left second and third MCP joints have tenderness and synovitis.  She has warmth and limited extension of the right knee joint.  Knee crepitus noted bilaterally. No tenderness or inflammation of ankle joints noted.   CDAI Exam: CDAI Score: 15.2  Patient Global: 6 mm; Provider Global: 6 mm Swollen: 7 ; Tender: 7  Joint Exam 11/26/2019      Right   Left  MCP 2  Swollen Tender  Swollen Tender  MCP 3  Swollen Tender  Swollen Tender  MCP 4  Swollen Tender     PIP 3  Swollen Tender     Knee  Swollen Tender        Investigation: No additional findings.  Imaging: No results found.  Recent Labs: Lab Results  Component Value Date   WBC 6.7 09/15/2019   HGB 9.2 (L) 09/15/2019   PLT 293 09/15/2019   NA 142 09/15/2019   K 4.2 09/15/2019   CL 99 09/15/2019   CO2 28 09/15/2019   GLUCOSE 150 (H) 09/15/2019   BUN 37 (H) 09/15/2019   CREATININE 1.60 (H) 09/15/2019   BILITOT 0.3 09/15/2019   ALKPHOS 108 09/15/2019   AST 24 09/15/2019   ALT 18 09/15/2019   PROT 6.5 09/15/2019   ALBUMIN  4.0 09/15/2019   CALCIUM 9.1 09/15/2019   GFRAA 32 (L) 09/15/2019    Speciality Comments: No specialty comments available.  Procedures:  No procedures performed Allergies: Ace inhibitors and Valsartan   Assessment / Plan:     Visit Diagnoses: Seronegative rheumatoid arthritis ([place]) - She has tenderness and synovitis of multiple MCPs and right 3rd PIP joint as described above.  She has persistent pain in both hands and both knee joints.  She started on Plaquenil 200 mg 1 tablet by mouth daily in February 2021 (6 weeks ago).  She has been tolerating Plaquenil without any side effects.  She has started to notice clinical improvement over the past several weeks.  She is able to make a complete fist bilaterally which she has been unable to do for several years.  She has not returned to physical therapy yet but has been performing some home hand exercises.  She will continue taking Plaquenil 200 mg 1 tablet by mouth daily and prednisone 7.5 mg daily.  We discussed that if she continues to have active synovitis we could add on Arava to her current treatment regimen.  We will reassess at her follow up visit.  She will follow-up in the office in 2 months.  Plan: hydroxychloroquine (PLAQUENIL) 200 MG tablet  High risk medication use - Plaquenil 200 mg 1 tablet  by mouth daily.  CBC and CMP were drawn on 09/15/2019.  She is due to update lab work today.  Orders were released.- Plan: CBC with Differential/Platelet, COMPLETE METABOLIC PANEL WITH GFR  Idiopathic chronic gout of multiple sites without tophus - She has not had any recent gout flares.  She is taking allopurinol 300 mg 1 tablet by mouth daily and colchicine 0.6 mg 1 tablet by mouth daily. uric acid: 10/05/2019 4.1.  She will continue taking allopurinol and colchicine as prescribed.  Refills of both medications were sent to the pharmacy today.  She was advised to notify us if she develop signs or symptoms of a gout flare.- Plan: allopurinol (ZYLOPRIM) 300 MG tablet  Trigger finger, left index finger: Resolved   Primary osteoarthritis of both hands: She has PIP and DIP thickening consistent with osteoarthritis of both hands.  She has tenderness and synovitis of the right third PIP joint.  She has complete fist formation today.  She has not restarted physical therapy yet but has been wearing a brace on her left hand which has been helping with her discomfort.  Primary osteoarthritis of both knees: She has chronic pain in both knee joints.  She uses a walker to assist with ambulation.  She has warmth and limited extension of the right knee joint on exam.  She has bilateral knee crepitus.  Primary osteoarthritis of both feet: She is not having any discomfort in her feet at this time.  She has no tenderness or inflammation of her ankle joints.  Other medical conditions are listed as follows:   History of hyperlipidemia  History of diabetes mellitus, type II  Acute on chronic renal insufficiency  History of diverticulitis  Hypertensive heart disease with chronic diastolic congestive heart failure ([place])  Obstructive sleep apnea on CPAP  History of TIAs    Orders: Orders Placed This Encounter  Procedures  . CBC with Differential/Platelet  . COMPLETE METABOLIC PANEL WITH GFR   Meds ordered  this encounter  Medications  . hydroxychloroquine (PLAQUENIL) 200 MG tablet    Sig: Take 1 tablet (200 mg total) by mouth daily.    Dispense:  90 tablet    Refill:  0  . colchicine 0.6 MG tablet    Sig: Take 1 tablet (0.6 mg total) by mouth daily.    Dispense:  90 tablet    Refill:  0  . allopurinol (ZYLOPRIM) 300 MG tablet    Sig: Take 1 tablet (300 mg total) by mouth daily.    Dispense:  90 tablet    Refill:  0  . predniSONE (DELTASONE) 2.5 MG tablet    Sig: Take 3 tablets (7.5 mg total) by mouth daily with breakfast.    Dispense:  270 tablet    Refill:  0      Follow-Up Instructions: Return in about 2 months (around 01/26/2020) for Rheumatoid arthritis, Gout.   Ofilia Neas, PA-C  Note - This record has been created using Dragon software.  Chart creation errors have been sought, but may not always  have been located. Such creation errors do not reflect on  the standard of medical care.

## 2019-11-26 ENCOUNTER — Encounter: Payer: Self-pay | Admitting: Physician Assistant

## 2019-11-26 ENCOUNTER — Other Ambulatory Visit: Payer: Self-pay

## 2019-11-26 ENCOUNTER — Ambulatory Visit: Payer: Medicare Other | Admitting: Physician Assistant

## 2019-11-26 VITALS — BP 136/72 | HR 59 | Resp 14 | Ht 59.0 in | Wt 160.0 lb

## 2019-11-26 DIAGNOSIS — M19041 Primary osteoarthritis, right hand: Secondary | ICD-10-CM | POA: Diagnosis not present

## 2019-11-26 DIAGNOSIS — M0600 Rheumatoid arthritis without rheumatoid factor, unspecified site: Secondary | ICD-10-CM | POA: Diagnosis not present

## 2019-11-26 DIAGNOSIS — M65322 Trigger finger, left index finger: Secondary | ICD-10-CM | POA: Diagnosis not present

## 2019-11-26 DIAGNOSIS — Z9989 Dependence on other enabling machines and devices: Secondary | ICD-10-CM

## 2019-11-26 DIAGNOSIS — M1A09X0 Idiopathic chronic gout, multiple sites, without tophus (tophi): Secondary | ICD-10-CM | POA: Diagnosis not present

## 2019-11-26 DIAGNOSIS — M19072 Primary osteoarthritis, left ankle and foot: Secondary | ICD-10-CM

## 2019-11-26 DIAGNOSIS — N289 Disorder of kidney and ureter, unspecified: Secondary | ICD-10-CM

## 2019-11-26 DIAGNOSIS — Z8639 Personal history of other endocrine, nutritional and metabolic disease: Secondary | ICD-10-CM

## 2019-11-26 DIAGNOSIS — G4733 Obstructive sleep apnea (adult) (pediatric): Secondary | ICD-10-CM

## 2019-11-26 DIAGNOSIS — Z79899 Other long term (current) drug therapy: Secondary | ICD-10-CM

## 2019-11-26 DIAGNOSIS — M19042 Primary osteoarthritis, left hand: Secondary | ICD-10-CM

## 2019-11-26 DIAGNOSIS — Z8719 Personal history of other diseases of the digestive system: Secondary | ICD-10-CM

## 2019-11-26 DIAGNOSIS — M19071 Primary osteoarthritis, right ankle and foot: Secondary | ICD-10-CM

## 2019-11-26 DIAGNOSIS — N189 Chronic kidney disease, unspecified: Secondary | ICD-10-CM

## 2019-11-26 DIAGNOSIS — Z8673 Personal history of transient ischemic attack (TIA), and cerebral infarction without residual deficits: Secondary | ICD-10-CM

## 2019-11-26 DIAGNOSIS — M170 Bilateral primary osteoarthritis of knee: Secondary | ICD-10-CM

## 2019-11-26 DIAGNOSIS — I110 Hypertensive heart disease with heart failure: Secondary | ICD-10-CM

## 2019-11-26 DIAGNOSIS — I5032 Chronic diastolic (congestive) heart failure: Secondary | ICD-10-CM

## 2019-11-26 MED ORDER — PREDNISONE 2.5 MG PO TABS: 7.5000 mg | ORAL_TABLET | Freq: Every day | ORAL | 0 refills | Status: DC

## 2019-11-26 MED ORDER — HYDROXYCHLOROQUINE SULFATE 200 MG PO TABS: 200.0000 mg | ORAL_TABLET | Freq: Every day | ORAL | 0 refills | Status: DC

## 2019-11-26 MED ORDER — ALLOPURINOL 300 MG PO TABS: 300.0000 mg | ORAL_TABLET | Freq: Every day | ORAL | 0 refills | Status: DC

## 2019-11-26 MED ORDER — COLCHICINE 0.6 MG PO TABS: 0.6000 mg | ORAL_TABLET | Freq: Every day | ORAL | 0 refills | Status: DC

## 2019-11-27 LAB — COMPLETE METABOLIC PANEL WITH GFR
AG Ratio: 1.5 (calc) (ref 1.0–2.5)
ALT: 18 U/L (ref 6–29)
AST: 18 U/L (ref 10–35)
Albumin: 3.7 g/dL (ref 3.6–5.1)
Alkaline phosphatase (APISO): 65 U/L (ref 37–153)
BUN/Creatinine Ratio: 29 (calc) — ABNORMAL HIGH (ref 6–22)
BUN: 53 mg/dL — ABNORMAL HIGH (ref 7–25)
CO2: 30 mmol/L (ref 20–32)
Calcium: 9.5 mg/dL (ref 8.6–10.4)
Chloride: 103 mmol/L (ref 98–110)
Creat: 1.8 mg/dL — ABNORMAL HIGH (ref 0.60–0.88)
GFR, Est African American: 28 mL/min/{1.73_m2} — ABNORMAL LOW (ref >=60)
GFR, Est Non African American: 24 mL/min/{1.73_m2} — ABNORMAL LOW (ref >=60)
Globulin: 2.5 g/dL (calc) (ref 1.9–3.7)
Glucose, Bld: 185 mg/dL — ABNORMAL HIGH (ref 65–99)
Potassium: 4.9 mmol/L (ref 3.5–5.3)
Sodium: 141 mmol/L (ref 135–146)
Total Bilirubin: 0.4 mg/dL (ref 0.2–1.2)
Total Protein: 6.2 g/dL (ref 6.1–8.1)

## 2019-11-27 LAB — CBC WITH DIFFERENTIAL/PLATELET
Absolute Monocytes: 448 cells/uL (ref 200–950)
Basophils Absolute: 16 cells/uL (ref 0–200)
Basophils Relative: 0.2 %
Eosinophils Absolute: 40 cells/uL (ref 15–500)
Eosinophils Relative: 0.5 %
HCT: 28.9 % — ABNORMAL LOW (ref 35.0–45.0)
Hemoglobin: 8.9 g/dL — ABNORMAL LOW (ref 11.7–15.5)
Lymphs Abs: 712 cells/uL — ABNORMAL LOW (ref 850–3900)
MCH: 26.3 pg — ABNORMAL LOW (ref 27.0–33.0)
MCHC: 30.8 g/dL — ABNORMAL LOW (ref 32.0–36.0)
MCV: 85.3 fL (ref 80.0–100.0)
Monocytes Relative: 5.6 %
Neutro Abs: 6784 cells/uL (ref 1500–7800)
Neutrophils Relative %: 84.8 %
Platelets: 237 10*3/uL (ref 140–400)
RBC: 3.39 10*6/uL — ABNORMAL LOW (ref 3.80–5.10)
RDW: 19.3 % — ABNORMAL HIGH (ref 11.0–15.0)
Total Lymphocyte: 8.9 %
WBC: 8 10*3/uL (ref 3.8–10.8)

## 2019-11-27 NOTE — Progress Notes (Signed)
RBC count, Hgb, and Hct are low and trending down.  Her anemia is likely due to chronic kidney disease.  Please notify patient and forward labs to PCP.  Creatinine remains elevated and GFR is low and trending down.  Her GFR was 28.  Please advise the patient to discontinue colchicine.   Glucose is elevated-185.

## 2019-11-30 ENCOUNTER — Other Ambulatory Visit: Payer: Self-pay | Admitting: Internal Medicine

## 2019-12-01 ENCOUNTER — Encounter: Payer: Self-pay | Admitting: *Deleted

## 2019-12-01 ENCOUNTER — Telehealth: Payer: Self-pay | Admitting: *Deleted

## 2019-12-01 MED ORDER — ALLOPURINOL 100 MG PO TABS: 200.0000 mg | ORAL_TABLET | Freq: Every day | ORAL | 0 refills | Status: AC

## 2019-12-01 NOTE — Progress Notes (Signed)
Patient ID: Maria [name], female   DOB: [birth year], 86 y.o.   MRN: [id number] ZIO patch monitor enrolled 06/08/2019 was returned to [facility] unused.

## 2019-12-01 NOTE — Telephone Encounter (Signed)
Received a retrospective drug utilization review from [place] regarding Allopurinol and renal disease. Recommendations per Hazel Sams, PA-C: patient was advised to discontinue colchicine after reviewing labs on 11/26/19. Please advise patient to reduce Allopurinol to 200 mg daily.   Patient advised to decrease Allopurinol to 200 mg daily. Prescription sent to the pharmacy for new dose.

## 2019-12-03 ENCOUNTER — Other Ambulatory Visit: Payer: Self-pay | Admitting: Cardiology

## 2019-12-03 ENCOUNTER — Telehealth: Payer: Self-pay | Admitting: Cardiovascular Disease

## 2019-12-03 ENCOUNTER — Telehealth (INDEPENDENT_AMBULATORY_CARE_PROVIDER_SITE_OTHER): Payer: Medicare Other | Admitting: Cardiology

## 2019-12-03 ENCOUNTER — Encounter: Payer: Self-pay | Admitting: Cardiology

## 2019-12-03 VITALS — BP 146/81 | HR 87 | Ht 59.0 in | Wt 157.0 lb

## 2019-12-03 DIAGNOSIS — G4733 Obstructive sleep apnea (adult) (pediatric): Secondary | ICD-10-CM

## 2019-12-03 DIAGNOSIS — M2550 Pain in unspecified joint: Secondary | ICD-10-CM | POA: Insufficient documentation

## 2019-12-03 DIAGNOSIS — R002 Palpitations: Secondary | ICD-10-CM

## 2019-12-03 DIAGNOSIS — I10 Essential (primary) hypertension: Secondary | ICD-10-CM

## 2019-12-03 DIAGNOSIS — E782 Mixed hyperlipidemia: Secondary | ICD-10-CM

## 2019-12-03 DIAGNOSIS — E119 Type 2 diabetes mellitus without complications: Secondary | ICD-10-CM

## 2019-12-03 MED ORDER — METOPROLOL SUCCINATE ER 25 MG PO TB24: ORAL_TABLET | ORAL | 3 refills | Status: DC

## 2019-12-03 MED ORDER — METOPROLOL SUCCINATE ER 25 MG PO TB24: ORAL_TABLET | ORAL | 0 refills | Status: DC

## 2019-12-03 NOTE — Telephone Encounter (Signed)
Encounter not needed:    Made a appt for the patient.

## 2019-12-03 NOTE — Patient Instructions (Signed)
Medication Instructions:   HOLD Lipitor (Atorvastatin)  *If you need a refill on your cardiac medications before your next appointment, please call your pharmacy*   Follow-Up: At [facility], you and your health needs are our priority.  As part of our continuing mission to provide you with exceptional heart care, we have created designated Provider Care Teams.  These Care Teams include your primary Cardiologist (physician) and Advanced Practice Providers (APPs -  Physician Assistants and Nurse Practitioners) who all work together to provide you with the care you need, when you need it.  We recommend signing up for the patient portal called "MyChart".  Sign up information is provided on this After Visit Summary.  MyChart is used to connect with patients for Virtual Visits (Telemedicine).  Patients are able to view lab/test results, encounter notes, upcoming appointments, etc.  Non-urgent messages can be sent to your provider as well.   To learn more about what you can do with MyChart, go to [email or web address].    Your next appointment:   Thursday, 12/31/19 at 10:45 AM  The format for your next appointment:   Virtual Visit   Provider:   Kerin Ransom, PA-C

## 2019-12-03 NOTE — Addendum Note (Signed)
Addended by: Therisa Doyne on: 12/03/2019 04:51 PM   Modules accepted: Orders

## 2019-12-03 NOTE — Progress Notes (Signed)
Virtual Visit via Telephone Note   This visit type was conducted due to national recommendations for restrictions regarding the COVID-19 Pandemic (e.g. social distancing) in an effort to limit this patient's exposure and mitigate transmission in our community.  Due to her co-morbid illnesses, this patient is at least at moderate risk for complications without adequate follow up.  This format is felt to be most appropriate for this patient at this time.  The patient did not have access to video technology/had technical difficulties with video requiring transitioning to audio format only (telephone).  All issues noted in this document were discussed and addressed.  No physical exam could be performed with this format.  Please refer to the patient's chart for her  consent to telehealth for [facility].   The patient was identified using 2 identifiers.  Date:  12/03/2019   ID:  Maria [name], DOB [birth year], MRN [id number]  Patient Location: Home Provider Location: Home  PCP:  Glendale Chard, MD  Cardiologist:  Quay Burow, MD  Electrophysiologist:  None   Evaluation Performed:  Follow-Up Visit  Chief Complaint:  none  History of Present Illness:    Maria [name] is a pleasant 86 y.o. female with a hx of chronic LE edema,HCVD, diastolic CHF, sleep apnea on C-pap, and prior normal coronaries and normal LVF. Her last echo was Aug 2020 and showedhyperdynamic systolic function, with an ejection fraction of >65%. The cavity size was normal. severe basal septal hypertrophy. Left ventricular diastolic Doppler parameters are consistent with impaired relaxation.  She has recently had problems with palpitations. Dr Gwenlyn Found ordered a ZIO in Sept for palpitations. It was mailed in Oct 17th but the results are still not available.  I did place her on Toprol at my office visit in Oct and she says her palpitations have resolved.  Her main complaint is arthritis.  The patient does not have  symptoms concerning for COVID-19 infection (fever, chills, cough, or new shortness of breath).    Past Medical History:  Diagnosis Date  . Anxiety   . Arthritis   . Blood transfusion   . Depression   . Diabetes mellitus   . Edema   . Heart murmur   . Hyperlipidemia   . Hypertension   . Sleep apnea    cpap  . Stroke ([place])    3 x tias  . TIA (transient ischemic attack)    Past Surgical History:  Procedure Laterality Date  . ABDOMINAL HYSTERECTOMY    . APPENDECTOMY    . NM MYOVIEW LTD  [id number]   post stress left ventricle is normal in size, ejection fraction is 65%, normal myocardial perfusion study, low risk scan  . PARTIAL HYSTERECTOMY    . TRANSTHORACIC ECHOCARDIOGRAM  [id number]     Current Meds  Medication Sig  . Accu-Chek FastClix Lancets MISC CHECK BLOOD SUGAR BEFORE BREAKFAST AND DINNER  . ACCU-CHEK SMARTVIEW test strip TEST TWICE DAILY BEFORE BREAKFAST AND DINNER  . allopurinol (ZYLOPRIM) 100 MG tablet Take 2 tablets (200 mg total) by mouth daily.  Marland Kitchen aspirin 81 MG tablet Take 1 tablet (81 mg total) by mouth daily.  Marland Kitchen atorvastatin (LIPITOR) 40 MG tablet TAKE 1 TABLET DAILY  . Cholecalciferol (VITAMIN D) 2000 UNITS tablet Take 2,000 Units by mouth daily.  . citalopram (CELEXA) 20 MG tablet Take 20 mg by mouth daily.  . diclofenac Sodium (VOLTAREN) 1 % GEL Apply 2 g topically 4 (four) times daily.  Marland Kitchen docusate sodium (COLACE) 100 MG  capsule Take 100 mg by mouth daily.  Marland Kitchen donepezil (ARICEPT) 10 MG tablet TAKE 1 TABLET DAILY IN THE EVENING  . ezetimibe (ZETIA) 10 MG tablet TAKE 1 TABLET AT BEDTIME  . FOLBIC 2.5-25-2 MG TABS tablet TAKE 1 TABLET DAILY  . furosemide (LASIX) 40 MG tablet TAKE 1 TABLET TWICE A DAY (NEED TO SCHEDULE AN APPOINTMENT)  . hydrALAZINE (APRESOLINE) 25 MG tablet TAKE 1 TABLET THREE TIMES A DAY  . hydroxychloroquine (PLAQUENIL) 200 MG tablet Take 1 tablet (200 mg total) by mouth daily.  . Insulin Pen Needle (BD PEN NEEDLE MICRO U/F) 32G X 6 MM MISC Use  as directed  . isosorbide mononitrate (IMDUR) 30 MG 24 hr tablet Take 1 tablet (30 mg total) by mouth daily.  Marland Kitchen LANTUS SOLOSTAR 100 UNIT/ML Solostar Pen INJECT 20 UNITS AT NIGHT UNDER THE SKIN AS PER INSULIN PROTOCOL  . loratadine (CLARITIN) 10 MG tablet Take 10 mg by mouth every morning.  . Magnesium 250 MG TABS Take by mouth at bedtime.  . metolazone (ZAROXOLYN) 2.5 MG tablet TAKE 1 TABLET EVERY OTHER DAY 30 MINUTES PRIOR TO YOUR MORNING LASIX DOSE  . metoprolol succinate (TOPROL-XL) 25 MG 24 hr tablet TAKE 1 TABLET(25 MG) BY MOUTH DAILY  . potassium chloride (KLOR-CON) 10 MEQ tablet   . predniSONE (DELTASONE) 2.5 MG tablet Take 3 tablets (7.5 mg total) by mouth daily with breakfast.  . telmisartan (MICARDIS) 80 MG tablet TAKE 1 TABLET DAILY     Allergies:   Ace inhibitors and Valsartan   Social History   Tobacco Use  . Smoking status: Never Smoker  . Smokeless tobacco: Never Used  Substance Use Topics  . Alcohol use: No  . Drug use: No     Family Hx: The patient's family history includes Healthy in her mother; Prostate cancer in her father.  ROS:   Please see the history of present illness.    All other systems reviewed and are negative.   Prior CV studies:   The following studies were reviewed today:    Labs/Other Tests and Data Reviewed:    EKG:  An ECG dated 07/02/2019 was personally reviewed today and demonstrated:  NSR, RBBB  Recent Labs: 06/03/2019: Magnesium 1.5; TSH 2.740 11/26/2019: ALT 18; BUN 53; Creat 1.80; Hemoglobin 8.9; Platelets 237; Potassium 4.9; Sodium 141   Recent Lipid Panel Lab Results  Component Value Date/Time   CHOL 137 03/03/2019 02:43 PM   TRIG 69 03/03/2019 02:43 PM   HDL 63 03/03/2019 02:43 PM   CHOLHDL 2.2 03/03/2019 02:43 PM   LDLCALC 60 03/03/2019 02:43 PM    Wt Readings from Last 3 Encounters:  12/03/19 157 lb (71.2 kg)  11/26/19 160 lb (72.6 kg)  10/15/19 162 lb 3.2 oz (73.6 kg)     Objective:    Vital Signs:  BP (!)  146/81   Pulse 87   Ht 4\' 11"  (1.499 m)   Wt 157 lb (71.2 kg)   BMI 31.71 kg/m    VITAL SIGNS:  reviewed  ASSESSMENT & PLAN:    Palpitations ZIO results from sept are not in Epic- I will investigate. The addition of Toprol 25 mg improved her symptoms.  Hypertensive cardiovascular disease LVH, DD, normal LVF on echo  Obstructive sleep apnea on CPAP She has not been on C-papsecondary to complications  Chest pain History of chest pain with normal coronariesFeb 2012  Insulin dependent type 2 diabetes mellitus ([place]) Per PCP  Arthritis- Followed by Rheumatology- she is now  on Prednisone.  I suggested we hold her Lipitor for 4 weeks to see if this helps.    COVID-19 Education: The signs and symptoms of COVID-19 were discussed with the patient and how to seek care for testing (follow up with PCP or arrange E-visit).  The importance of social distancing was discussed today.  Time:   Today, I have spent 15 minutes with the patient with telehealth technology discussing the above problems.     Medication Adjustments/Labs and Tests Ordered: Current medicines are reviewed at length with the patient today.  Concerns regarding medicines are outlined above.   Tests Ordered: No orders of the defined types were placed in this encounter.   Medication Changes: No orders of the defined types were placed in this encounter.   Follow Up:  Virtual Visit  4 weeks with me  Signed, Kerin Ransom, PA-C  12/03/2019 3:18 PM    [place]

## 2019-12-04 ENCOUNTER — Telehealth: Payer: Self-pay | Admitting: *Deleted

## 2019-12-04 NOTE — Telephone Encounter (Signed)
Patient advised to contact the PCP and have them order a bone density scan. Patient advised the goal is to taper prednisone in the future once her arthritis is better controlled. Patient verbalized understanding.

## 2019-12-04 NOTE — Telephone Encounter (Signed)
Please notify patient that she is due to update DEXA.  The goal is to try to taper prednisone in the future once her arthritis is better controlled.

## 2019-12-04 NOTE — Telephone Encounter (Signed)
Recieved a retrospective Drug Utilization from [place] by Hazel Sams, PA-C  Patient is on Prednisone and not on osteoporosis therapy.  Per Hazel Sams, PA-C Please check if patient has had a recent DEXA. She has chronic kidney disease so her treatment options for osteoporosis/ Vitamin D supplement will be limited. We will try tapering off prednisone once her RA is better controlled.   Spoke with patient and the last bone density scan was in 2018 ordered by PCP.

## 2019-12-15 ENCOUNTER — Encounter: Payer: Self-pay | Admitting: Cardiology

## 2019-12-21 ENCOUNTER — Telehealth: Payer: Self-pay | Admitting: Cardiology

## 2019-12-21 ENCOUNTER — Telehealth: Payer: Self-pay

## 2019-12-21 NOTE — Telephone Encounter (Signed)
Maria [name] is calling stating she was marked as no show for her virtual appointment on 12/03/19. Camelle did speak with Kerin Ransom for her virtual appointment on this day. Please have this changed and updated in the system.

## 2019-12-22 NOTE — Telephone Encounter (Signed)
Follow Up   Based on what I see in the system, the appointment on 12/03/19 with Kerin Ransom is showing completed.

## 2019-12-30 DIAGNOSIS — H18423 Band keratopathy, bilateral: Secondary | ICD-10-CM | POA: Diagnosis not present

## 2019-12-30 DIAGNOSIS — E11319 Type 2 diabetes mellitus with unspecified diabetic retinopathy without macular edema: Secondary | ICD-10-CM | POA: Diagnosis not present

## 2019-12-30 DIAGNOSIS — Z961 Presence of intraocular lens: Secondary | ICD-10-CM | POA: Diagnosis not present

## 2019-12-31 ENCOUNTER — Telehealth: Payer: Medicare Other | Admitting: Cardiology

## 2020-01-03 ENCOUNTER — Other Ambulatory Visit: Payer: Self-pay | Admitting: Cardiology

## 2020-01-05 ENCOUNTER — Telehealth: Payer: Self-pay | Admitting: *Deleted

## 2020-01-05 ENCOUNTER — Ambulatory Visit: Payer: Medicare Other | Admitting: Internal Medicine

## 2020-01-05 ENCOUNTER — Telehealth: Payer: Self-pay | Admitting: Internal Medicine

## 2020-01-05 ENCOUNTER — Ambulatory Visit: Payer: Medicare Other

## 2020-01-05 NOTE — Chronic Care Management (AMB) (Signed)
  Chronic Care Management   Note  01/05/2020 Name: Maria [name] MRN: [id number] DOB: 01-13-1926  Maria [name] is a 86 y.o. year old female who is a primary care patient of Glendale Chard, MD and is actively engaged with the care management team. I reached out to Maria [name] by phone today to assist with scheduling an initial visit with the Pharmacist referred by patients health plan.   Follow up plan: Telephone appointment with care management team member scheduled for:01/13/2020.  [place], [place] [address] Direct Dial: [phone] [email or web address]

## 2020-01-05 NOTE — Progress Notes (Signed)
Error

## 2020-01-06 ENCOUNTER — Encounter: Payer: Self-pay | Admitting: Internal Medicine

## 2020-01-06 ENCOUNTER — Other Ambulatory Visit: Payer: Self-pay

## 2020-01-06 ENCOUNTER — Ambulatory Visit (INDEPENDENT_AMBULATORY_CARE_PROVIDER_SITE_OTHER): Payer: Medicare Other

## 2020-01-06 ENCOUNTER — Ambulatory Visit (INDEPENDENT_AMBULATORY_CARE_PROVIDER_SITE_OTHER): Payer: Medicare Other | Admitting: Internal Medicine

## 2020-01-06 VITALS — BP 138/64 | HR 88 | Temp 98.0°F | Ht 59.8 in | Wt 159.8 lb

## 2020-01-06 DIAGNOSIS — I130 Hypertensive heart and chronic kidney disease with heart failure and stage 1 through stage 4 chronic kidney disease, or unspecified chronic kidney disease: Secondary | ICD-10-CM

## 2020-01-06 DIAGNOSIS — R269 Unspecified abnormalities of gait and mobility: Secondary | ICD-10-CM

## 2020-01-06 DIAGNOSIS — E1122 Type 2 diabetes mellitus with diabetic chronic kidney disease: Secondary | ICD-10-CM | POA: Diagnosis not present

## 2020-01-06 DIAGNOSIS — N183 Chronic kidney disease, stage 3 unspecified: Secondary | ICD-10-CM

## 2020-01-06 DIAGNOSIS — E119 Type 2 diabetes mellitus without complications: Secondary | ICD-10-CM | POA: Diagnosis not present

## 2020-01-06 DIAGNOSIS — M1711 Unilateral primary osteoarthritis, right knee: Secondary | ICD-10-CM

## 2020-01-06 DIAGNOSIS — Z794 Long term (current) use of insulin: Secondary | ICD-10-CM | POA: Diagnosis not present

## 2020-01-06 DIAGNOSIS — Z6831 Body mass index (BMI) 31.0-31.9, adult: Secondary | ICD-10-CM

## 2020-01-06 DIAGNOSIS — E6609 Other obesity due to excess calories: Secondary | ICD-10-CM

## 2020-01-06 DIAGNOSIS — Z0000 Encounter for general adult medical examination without abnormal findings: Secondary | ICD-10-CM

## 2020-01-06 DIAGNOSIS — I5032 Chronic diastolic (congestive) heart failure: Secondary | ICD-10-CM | POA: Diagnosis not present

## 2020-01-06 LAB — POCT URINALYSIS DIPSTICK
Bilirubin, UA: NEGATIVE
Blood, UA: NEGATIVE
Glucose, UA: NEGATIVE
Ketones, UA: NEGATIVE
Leukocytes, UA: NEGATIVE
Nitrite, UA: NEGATIVE
Protein, UA: NEGATIVE
Spec Grav, UA: 1.02 (ref 1.010–1.025)
Urobilinogen, UA: 0.2 E.U./dL
pH, UA: 6 (ref 5.0–8.0)

## 2020-01-06 MED ORDER — ACCU-CHEK FASTCLIX LANCETS MISC: 1 refills | Status: DC

## 2020-01-06 NOTE — Progress Notes (Signed)
This visit occurred during the SARS-CoV-2 public health emergency.  Safety protocols were in place, including screening questions prior to the visit, additional usage of staff PPE, and extensive cleaning of exam room while observing appropriate contact time as indicated for disinfecting solutions.  Subjective:     Patient ID: Maria [name] , female    DOB: May 22, 1926 , 86 y.o.   MRN: [id number]   Chief Complaint  Patient presents with  . Diabetes  . Hypertension    HPI  She is here today for diabetes and high blood pressure f/u. She reports compliance with meds. She reports dietary compliance.   Diabetes She presents for her follow-up diabetic visit. She has type 2 diabetes mellitus. Her disease course has been fluctuating. There are no hypoglycemic associated symptoms. Pertinent negatives for diabetes include no blurred vision and no chest pain. There are no hypoglycemic complications. Diabetic complications include nephropathy. Risk factors for coronary artery disease include diabetes mellitus, dyslipidemia, hypertension, obesity, sedentary lifestyle and post-menopausal.  Hypertension This is a chronic problem. The current episode started more than 1 year ago. The problem has been gradually improving since onset. The problem is controlled. Pertinent negatives include no blurred vision, chest pain, palpitations or shortness of breath.     Past Medical History:  Diagnosis Date  . Anxiety   . Arthritis   . Blood transfusion   . Depression   . Diabetes mellitus   . Edema   . Heart murmur   . Hyperlipidemia   . Hypertension   . Sleep apnea    cpap  . Stroke ([place])    3 x tias  . TIA (transient ischemic attack)      Family History  Problem Relation Age of Onset  . Healthy Mother   . Prostate cancer Father      Current Outpatient Medications:  .  Accu-Chek FastClix Lancets MISC, CHECK BLOOD SUGAR BEFORE BREAKFAST AND DINNER, Disp: 306 each, Rfl: 1 .  ACCU-CHEK SMARTVIEW  test strip, TEST TWICE DAILY BEFORE BREAKFAST AND DINNER, Disp: 300 strip, Rfl: 5 .  allopurinol (ZYLOPRIM) 100 MG tablet, Take 2 tablets (200 mg total) by mouth daily. (Patient not taking: Reported on 01/06/2020), Disp: 180 tablet, Rfl: 0 .  amLODipine (NORVASC) 5 MG tablet, , Disp: , Rfl:  .  aspirin 81 MG tablet, Take 1 tablet (81 mg total) by mouth daily., Disp: , Rfl:  .  atorvastatin (LIPITOR) 40 MG tablet, TAKE 1 TABLET DAILY, Disp: 90 tablet, Rfl: 3 .  Cholecalciferol (VITAMIN D) 2000 UNITS tablet, Take 2,000 Units by mouth daily., Disp: , Rfl:  .  citalopram (CELEXA) 20 MG tablet, Take 20 mg by mouth daily., Disp: , Rfl:  .  diclofenac Sodium (VOLTAREN) 1 % GEL, Apply 2 g topically 4 (four) times daily. (Patient not taking: Reported on 01/06/2020), Disp: 50 g, Rfl: 2 .  docusate sodium (COLACE) 100 MG capsule, Take 100 mg by mouth daily., Disp: , Rfl:  .  donepezil (ARICEPT) 10 MG tablet, TAKE 1 TABLET DAILY IN THE EVENING, Disp: 90 tablet, Rfl: 3 .  ezetimibe (ZETIA) 10 MG tablet, TAKE 1 TABLET AT BEDTIME, Disp: 90 tablet, Rfl: 3 .  FOLBIC 2.5-25-2 MG TABS tablet, TAKE 1 TABLET DAILY, Disp: 90 tablet, Rfl: 3 .  furosemide (LASIX) 40 MG tablet, TAKE 1 TABLET TWICE A DAY (NEED TO SCHEDULE AN APPOINTMENT), Disp: 90 tablet, Rfl: 1 .  hydrALAZINE (APRESOLINE) 25 MG tablet, TAKE 1 TABLET THREE TIMES A DAY, Disp: 270  tablet, Rfl: 3 .  hydroxychloroquine (PLAQUENIL) 200 MG tablet, Take 1 tablet (200 mg total) by mouth daily., Disp: 90 tablet, Rfl: 0 .  Insulin Pen Needle (BD PEN NEEDLE MICRO U/F) 32G X 6 MM MISC, Use as directed, Disp: 300 each, Rfl: 2 .  isosorbide mononitrate (IMDUR) 30 MG 24 hr tablet, Take 1 tablet (30 mg total) by mouth daily., Disp: 90 tablet, Rfl: 3 .  LANTUS SOLOSTAR 100 UNIT/ML Solostar Pen, INJECT 20 UNITS AT NIGHT UNDER THE SKIN AS PER INSULIN PROTOCOL, Disp: 45 mL, Rfl: 3 .  loratadine (CLARITIN) 10 MG tablet, Take 10 mg by mouth every morning., Disp: , Rfl:  .   Magnesium 250 MG TABS, Take by mouth at bedtime., Disp: , Rfl:  .  metolazone (ZAROXOLYN) 2.5 MG tablet, TAKE 1 TABLET EVERY OTHER DAY 30 MINUTES PRIOR TO YOUR MORNING LASIX DOSE, Disp: 45 tablet, Rfl: 3 .  metoprolol succinate (TOPROL-XL) 25 MG 24 hr tablet, TAKE 1 TABLET(25 MG) BY MOUTH DAILY, Disp: 90 tablet, Rfl: 0 .  potassium chloride (KLOR-CON) 10 MEQ tablet, , Disp: , Rfl:  .  predniSONE (DELTASONE) 2.5 MG tablet, Take 3 tablets (7.5 mg total) by mouth daily with breakfast., Disp: 270 tablet, Rfl: 0 .  telmisartan (MICARDIS) 80 MG tablet, TAKE 1 TABLET DAILY, Disp: 90 tablet, Rfl: 3   Allergies  Allergen Reactions  . Ace Inhibitors Other (See Comments)    Angioedema   . Valsartan Other (See Comments)     Review of Systems  Constitutional: Negative.   Eyes: Negative for blurred vision.  Respiratory: Negative.  Negative for shortness of breath.   Cardiovascular: Negative.  Negative for chest pain and palpitations.  Gastrointestinal: Negative.   Musculoskeletal: Positive for arthralgias.       She c/o b/l knee pain. Makes it painful to walk. Feels unsteady at times.   Neurological: Negative.   Psychiatric/Behavioral: Negative.      Today's Vitals   01/06/20 1118  BP: 138/64  Pulse: 88  Temp: 98 F (36.7 C)  TempSrc: Oral  Weight: 159 lb 12.8 oz (72.5 kg)  Height: 4' 11.8" (1.519 m)  PainSc: 0-No pain   Body mass index is 31.42 kg/m.   Objective:  Physical Exam Vitals and nursing note reviewed.  Constitutional:      Appearance: Normal appearance.  HENT:     Head: Normocephalic and atraumatic.  Cardiovascular:     Rate and Rhythm: Normal rate and regular rhythm.     Heart sounds: Normal heart sounds.  Pulmonary:     Effort: Pulmonary effort is normal.     Breath sounds: Normal breath sounds.  Skin:    General: Skin is warm.  Neurological:     General: No focal deficit present.     Mental Status: She is alert.  Psychiatric:        Mood and Affect: Mood  normal.        Behavior: Behavior normal.         Assessment And Plan:     1. Type 2 diabetes mellitus with stage 3 chronic kidney disease, with long-term current use of insulin, unspecified whether stage 3a or 3b CKD (HCC)  Chronic, I will check labs as listed below. She was congratulated on her continued dietary compliance.   - Phosphorus - Parathyroid Hormone, Intact w/Ca - BMP8+EGFR - Hemoglobin A1c - Lipid panel  2. Hypertensive heart and kidney disease with chronic diastolic congestive heart failure and stage 3 chronic kidney  disease, unspecified whether stage 3a or 3b CKD (HCC)  Chronic, fair control.  She will continue with current meds for now. She is encouraged to avoid adding salt to her foods.   3. Primary osteoarthritis of right knee  Chronic. She is encouraged to notify Rheum at her next appt about her symptoms. She may benefit from intra-articular steroid injection.   4. Class 1 obesity due to excess calories with serious comorbidity and body mass index (BMI) of 31.0 to 31.9 in adult  She is encouraged to strive for BMI less than 29 to decrease cardiac risk.   5. Gait abnormality  She agrees to home health referral for PT evaluation. She is currently ambulatory w/ walker - still feels unsteady on occasion.   - Ambulatory referral to [facility], MD    THE PATIENT IS ENCOURAGED TO PRACTICE SOCIAL DISTANCING DUE TO THE COVID-19 PANDEMIC.

## 2020-01-06 NOTE — Patient Instructions (Signed)
Maria [name] , Thank you for taking time to come for your Medicare Wellness Visit. I appreciate your ongoing commitment to your health goals. Please review the following plan we discussed and let me know if I can assist you in the future.   Screening recommendations/referrals: Colonoscopy: not required Mammogram: not required Bone Density: 01/2017 Recommended yearly ophthalmology/optometry visit for glaucoma screening and checkup Recommended yearly dental visit for hygiene and checkup  Vaccinations: Influenza vaccine: 05/2019 Pneumococcal vaccine: 01/2015 Tdap vaccine: 10/2017 Shingles vaccine: discussed    Advanced directives: Advance directive discussed with you today. Even though you declined this today please call our office should you change your mind and we can give you the proper paperwork for you to fill out.   Conditions/risks identified: obesity  Next appointment: 03/07/2020 at 10:00   Preventive Care 7 Years and Older, Female Preventive care refers to lifestyle choices and visits with your health care provider that can promote health and wellness. What does preventive care include?  A yearly physical exam. This is also called an annual well check.  Dental exams once or twice a year.  Routine eye exams. Ask your health care provider how often you should have your eyes checked.  Personal lifestyle choices, including:  Daily care of your teeth and gums.  Regular physical activity.  Eating a healthy diet.  Avoiding tobacco and drug use.  Limiting alcohol use.  Practicing safe sex.  Taking low-dose aspirin every day.  Taking vitamin and mineral supplements as recommended by your health care provider. What happens during an annual well check? The services and screenings done by your health care provider during your annual well check will depend on your age, overall health, lifestyle risk factors, and family history of disease. Counseling  Your health care  provider may ask you questions about your:  Alcohol use.  Tobacco use.  Drug use.  Emotional well-being.  Home and relationship well-being.  Sexual activity.  Eating habits.  History of falls.  Memory and ability to understand (cognition).  Work and work Statistician.  Reproductive health. Screening  You may have the following tests or measurements:  Height, weight, and BMI.  Blood pressure.  Lipid and cholesterol levels. These may be checked every 5 years, or more frequently if you are over 13 years old.  Skin check.  Lung cancer screening. You may have this screening every year starting at age 94 if you have a 30-pack-year history of smoking and currently smoke or have quit within the past 15 years.  Fecal occult blood test (FOBT) of the stool. You may have this test every year starting at age 20.  Flexible sigmoidoscopy or colonoscopy. You may have a sigmoidoscopy every 5 years or a colonoscopy every 10 years starting at age 80.  Hepatitis C blood test.  Hepatitis B blood test.  Sexually transmitted disease (STD) testing.  Diabetes screening. This is done by checking your blood sugar (glucose) after you have not eaten for a while (fasting). You may have this done every 1-3 years.  Bone density scan. This is done to screen for osteoporosis. You may have this done starting at age 41.  Mammogram. This may be done every 1-2 years. Talk to your health care provider about how often you should have regular mammograms. Talk with your health care provider about your test results, treatment options, and if necessary, the need for more tests. Vaccines  Your health care provider may recommend certain vaccines, such as:  Influenza vaccine. This is  recommended every year.  Tetanus, diphtheria, and acellular pertussis (Tdap, Td) vaccine. You may need a Td booster every 10 years.  Zoster vaccine. You may need this after age 54.  Pneumococcal 13-valent conjugate (PCV13)  vaccine. One dose is recommended after age 87.  Pneumococcal polysaccharide (PPSV23) vaccine. One dose is recommended after age 61. Talk to your health care provider about which screenings and vaccines you need and how often you need them. This information is not intended to replace advice given to you by your health care provider. Make sure you discuss any questions you have with your health care provider. Document Released: 09/23/2015 Document Revised: 05/16/2016 Document Reviewed: 06/28/2015 Elsevier Interactive Patient Education  2017 [place] Prevention in the Home Falls can cause injuries. They can happen to people of all ages. There are many things you can do to make your home safe and to help prevent falls. What can I do on the outside of my home?  Regularly fix the edges of walkways and driveways and fix any cracks.  Remove anything that might make you trip as you walk through a door, such as a raised step or threshold.  Trim any bushes or trees on the path to your home.  Use bright outdoor lighting.  Clear any walking paths of anything that might make someone trip, such as rocks or tools.  Regularly check to see if handrails are loose or broken. Make sure that both sides of any steps have handrails.  Any raised decks and porches should have guardrails on the edges.  Have any leaves, snow, or ice cleared regularly.  Use sand or salt on walking paths during winter.  Clean up any spills in your garage right away. This includes oil or grease spills. What can I do in the bathroom?  Use night lights.  Install grab bars by the toilet and in the tub and shower. Do not use towel bars as grab bars.  Use non-skid mats or decals in the tub or shower.  If you need to sit down in the shower, use a plastic, non-slip stool.  Keep the floor dry. Clean up any water that spills on the floor as soon as it happens.  Remove soap buildup in the tub or shower  regularly.  Attach bath mats securely with double-sided non-slip rug tape.  Do not have throw rugs and other things on the floor that can make you trip. What can I do in the bedroom?  Use night lights.  Make sure that you have a light by your bed that is easy to reach.  Do not use any sheets or blankets that are too big for your bed. They should not hang down onto the floor.  Have a firm chair that has side arms. You can use this for support while you get dressed.  Do not have throw rugs and other things on the floor that can make you trip. What can I do in the kitchen?  Clean up any spills right away.  Avoid walking on wet floors.  Keep items that you use a lot in easy-to-reach places.  If you need to reach something above you, use a strong step stool that has a grab bar.  Keep electrical cords out of the way.  Do not use floor polish or wax that makes floors slippery. If you must use wax, use non-skid floor wax.  Do not have throw rugs and other things on the floor that can make you trip.  What can I do with my stairs?  Do not leave any items on the stairs.  Make sure that there are handrails on both sides of the stairs and use them. Fix handrails that are broken or loose. Make sure that handrails are as long as the stairways.  Check any carpeting to make sure that it is firmly attached to the stairs. Fix any carpet that is loose or worn.  Avoid having throw rugs at the top or bottom of the stairs. If you do have throw rugs, attach them to the floor with carpet tape.  Make sure that you have a light switch at the top of the stairs and the bottom of the stairs. If you do not have them, ask someone to add them for you. What else can I do to help prevent falls?  Wear shoes that:  Do not have high heels.  Have rubber bottoms.  Are comfortable and fit you well.  Are closed at the toe. Do not wear sandals.  If you use a stepladder:  Make sure that it is fully  opened. Do not climb a closed stepladder.  Make sure that both sides of the stepladder are locked into place.  Ask someone to hold it for you, if possible.  Clearly mark and make sure that you can see:  Any grab bars or handrails.  First and last steps.  Where the edge of each step is.  Use tools that help you move around (mobility aids) if they are needed. These include:  Canes.  Walkers.  Scooters.  Crutches.  Turn on the lights when you go into a dark area. Replace any light bulbs as soon as they burn out.  Set up your furniture so you have a clear path. Avoid moving your furniture around.  If any of your floors are uneven, fix them.  If there are any pets around you, be aware of where they are.  Review your medicines with your doctor. Some medicines can make you feel dizzy. This can increase your chance of falling. Ask your doctor what other things that you can do to help prevent falls. This information is not intended to replace advice given to you by your health care provider. Make sure you discuss any questions you have with your health care provider. Document Released: 06/23/2009 Document Revised: 02/02/2016 Document Reviewed: 10/01/2014 Elsevier Interactive Patient Education  2017 [employer].

## 2020-01-06 NOTE — Progress Notes (Signed)
This visit occurred during the SARS-CoV-2 public health emergency.  Safety protocols were in place, including screening questions prior to the visit, additional usage of staff PPE, and extensive cleaning of exam room while observing appropriate contact time as indicated for disinfecting solutions.  Subjective:   Maria [name] is a 86 y.o. female who presents for Medicare Annual (Subsequent) preventive examination.  Review of Systems:  n/a Cardiac Risk Factors include: advanced age (>41mn, >>16women);diabetes mellitus;dyslipidemia;hypertension;sedentary lifestyle;obesity (BMI >30kg/m2)     Objective:     Vitals: BP 138/64 (BP Location: Left Arm, Patient Position: Sitting, Cuff Size: Normal)   Pulse 88   Temp 98 F (36.7 C) (Oral)   Ht 4' 11.8" (1.519 m)   Wt 159 lb 12.8 oz (72.5 kg)   SpO2 99%   BMI 31.42 kg/m   Body mass index is 31.42 kg/m.  Advanced Directives 01/06/2020 12/31/2018 10/02/2016 09/01/2016 03/04/2013 01/31/2012  Does Patient Have a Medical Advance Directive? No No Yes No Patient has advance directive, copy not in chart Patient does not have advance directive;Patient would not like information  [place] of [place] - [place] Living will -  Copy of [place] Chart? - - No - copy requested - Copy requested from family -  Would patient like information on creating a medical advance directive? No - Patient declined No - Patient declined No - Patient declined - - -  Pre-existing out of facility DNR order (yellow form or pink MOST form) - - - - No No    Tobacco Social History   Tobacco Use  Smoking Status Never Smoker  Smokeless Tobacco Never Used     Counseling given: Not Answered   Clinical Intake:  Pre-visit preparation completed: Yes  Pain : 0-10 Pain Score: 9  Pain Type: Chronic pain Pain Location: Knee Pain Orientation: Right, Left Pain Descriptors / Indicators: Aching Pain Onset: More  than a month ago Pain Frequency: Constant Pain Relieving Factors: advil helps some  Pain Relieving Factors: advil helps some  Nutritional Status: BMI > 30  Obese Nutritional Risks: None Diabetes: Yes  How often do you need to have someone help you when you read instructions, pamphlets, or other written materials from your doctor or pharmacy?: 1 - Never  Interpreter Needed?: No  Information entered by :: NAllen LPN  Past Medical History:  Diagnosis Date  . Anxiety   . Arthritis   . Blood transfusion   . Depression   . Diabetes mellitus   . Edema   . Heart murmur   . Hyperlipidemia   . Hypertension   . Sleep apnea    cpap  [place]  . TIA (transient ischemic attack)    Past Surgical History:  Procedure Laterality Date  . ABDOMINAL HYSTERECTOMY    . APPENDECTOMY    . NM MYOVIEW LTD  [id number]  post stress left ventricle is normal in size, ejection fraction is 65%, normal myocardial perfusion study, low risk scan  . PARTIAL HYSTERECTOMY    . TRANSTHORACIC ECHOCARDIOGRAM  [id number]  Family History  Problem Relation Age of Onset  . Healthy Mother   . Prostate cancer Father    Social History   Socioeconomic History  . Marital status: Widowed    Spouse name: Not on file  . Number of children: Not on file  . Years of education: Not on file  . Highest education  level: Not on file  Occupational History  . Occupation: retired  Tobacco Use  . Smoking status: Never Smoker  . Smokeless tobacco: Never Used  Substance and Sexual Activity  . Alcohol use: No  . Drug use: No  . Sexual activity: Not Currently  Other Topics Concern  . Not on file  Social History Narrative   Has SCAT   Social Determinants of Health   Financial Resource Strain: Low Risk   . Difficulty of Paying Living Expenses: Not hard at all  Food Insecurity: No Food Insecurity  . Worried About Charity fundraiser in the Last Year: Never true  . Ran Out of Food in the Last Year:  Never true  Transportation Needs: No Transportation Needs  . Lack of Transportation (Medical): No  . Lack of Transportation (Non-Medical): No  Physical Activity: Inactive  . Days of Exercise per Week: 0 days  . Minutes of Exercise per Session: 0 min  Stress: No Stress Concern Present  . Feeling of Stress : Not at all  Social Connections:   . Frequency of Communication with Friends and Family:   . Frequency of Social Gatherings with Friends and Family:   . Attends Religious Services:   . Active Member of Clubs or Organizations:   . Attends Archivist Meetings:   Marland Kitchen Marital Status:     Outpatient Encounter Medications as of 01/06/2020  Medication Sig  . Accu-Chek FastClix Lancets MISC CHECK BLOOD SUGAR BEFORE BREAKFAST AND DINNER  . ACCU-CHEK SMARTVIEW test strip TEST TWICE DAILY BEFORE BREAKFAST AND DINNER  . amLODipine (NORVASC) 5 MG tablet   . aspirin 81 MG tablet Take 1 tablet (81 mg total) by mouth daily.  Marland Kitchen atorvastatin (LIPITOR) 40 MG tablet TAKE 1 TABLET DAILY  . Cholecalciferol (VITAMIN D) 2000 UNITS tablet Take 2,000 Units by mouth daily.  . citalopram (CELEXA) 20 MG tablet Take 20 mg by mouth daily.  Marland Kitchen docusate sodium (COLACE) 100 MG capsule Take 100 mg by mouth daily.  Marland Kitchen donepezil (ARICEPT) 10 MG tablet TAKE 1 TABLET DAILY IN THE EVENING  . ezetimibe (ZETIA) 10 MG tablet TAKE 1 TABLET AT BEDTIME  . FOLBIC 2.5-25-2 MG TABS tablet TAKE 1 TABLET DAILY  . furosemide (LASIX) 40 MG tablet TAKE 1 TABLET TWICE A DAY (NEED TO SCHEDULE AN APPOINTMENT)  . hydrALAZINE (APRESOLINE) 25 MG tablet TAKE 1 TABLET THREE TIMES A DAY  . hydroxychloroquine (PLAQUENIL) 200 MG tablet Take 1 tablet (200 mg total) by mouth daily.  . Insulin Pen Needle (BD PEN NEEDLE MICRO U/F) 32G X 6 MM MISC Use as directed  . isosorbide mononitrate (IMDUR) 30 MG 24 hr tablet Take 1 tablet (30 mg total) by mouth daily.  Marland Kitchen LANTUS SOLOSTAR 100 UNIT/ML Solostar Pen INJECT 20 UNITS AT NIGHT UNDER THE SKIN AS  PER INSULIN PROTOCOL  . loratadine (CLARITIN) 10 MG tablet Take 10 mg by mouth every morning.  . Magnesium 250 MG TABS Take by mouth at bedtime.  . metolazone (ZAROXOLYN) 2.5 MG tablet TAKE 1 TABLET EVERY OTHER DAY 30 MINUTES PRIOR TO YOUR MORNING LASIX DOSE  . metoprolol succinate (TOPROL-XL) 25 MG 24 hr tablet TAKE 1 TABLET(25 MG) BY MOUTH DAILY  . potassium chloride (KLOR-CON) 10 MEQ tablet   . predniSONE (DELTASONE) 2.5 MG tablet Take 3 tablets (7.5 mg total) by mouth daily with breakfast.  . telmisartan (MICARDIS) 80 MG tablet TAKE 1 TABLET DAILY  . allopurinol (ZYLOPRIM) 100 MG tablet Take 2 tablets (200  mg total) by mouth daily. (Patient not taking: Reported on 01/06/2020)  . diclofenac Sodium (VOLTAREN) 1 % GEL Apply 2 g topically 4 (four) times daily. (Patient not taking: Reported on 01/06/2020)  . [DISCONTINUED] clonazePAM (KLONOPIN) 0.5 MG tablet Take 0.5 mg by mouth at bedtime.   No facility-administered encounter medications on file as of 01/06/2020.    Activities of Daily Living In your present state of health, do you have any difficulty performing the following activities: 01/06/2020  Hearing? N  Vision? N  Difficulty concentrating or making decisions? Y  Comment forgetfulness  Walking or climbing stairs? Y  Dressing or bathing? Y  Comment has an Tourist information centre manager errands, shopping? Y  Comment gets rides to appointments and aide does shopping  Preparing Food and eating ? Y  Comment has aides  Using the Toilet? N  In the past six months, have you accidently leaked urine? N  Do you have problems with loss of bowel control? N  Managing your Medications? Y  Comment someone sets up medications  Managing your Finances? N  Housekeeping or managing your Housekeeping? Y  Comment has an aide  Some recent data might be hidden    Patient Care Team: Glendale Chard, MD as PCP - General (Internal Medicine) Lorretta Harp, MD as PCP - Cardiology (Cardiology) Hayden Pedro, MD as  Consulting Physician (Ophthalmology) Daneen Schick as Social Worker [place], Kennieth Francois, [facility] (Pharmacist)    Assessment:   This is a routine wellness examination for Kenlee.  Exercise Activities and Dietary recommendations Current Exercise Habits: The patient does not participate in regular exercise at present  Goals    . "I don't know what food I can eat" (pt-stated)     Current Barriers:  Marland Kitchen Multiple conditions impacted by diet choices including DM II, CKD III, HTN, and Gout . Limited knowledge of specific foods okay to eat and/or avoid in relation to comorbid conditions   Social Work Clinical Goal(s):  Marland Kitchen Over the next 45 days the patient will work with CCM RN Case Manager to become more knowledgeable of diet restrictions   CCM SW Interventions: . Patient interviewed and appropriate assessments performed . Determined the patient is unsure what food she can/can not eat in relation to chronic conditions - "can I eat beef? I know I can't eat liver" . Advised the patient SW would request RN Case Manager review diet restrictions during the next planned outreach call . Collaboration with RN Case Manager to communicate patient goal to become more knowledgeable of diet   CCM RN CM Interventions:  07/03/19 call completed with patient   . Evaluation of current treatment plan related to dietary recommendations for DM and CHF and patient's adherence to plan as established by provider. . Provided education to patient re: dietary recommendations appropriate for chronic conditions such as DM and CHF; instructed patient to eat plenty of fruits and vegetables, fresh foods including lean meats, fish, poultry, eggs (no more than 3 per week unless eating egg whites only), milk, yogurt, plain or brown rice, oatmeal, low sodium snacks and reiterated the importance of avoiding the salt shaker . Reviewed medications with patient and discussed patient is taking her Lantus exactly as directed  . Discussed  plans with patient for ongoing care management follow up and provided patient with direct contact information for care management team . Provided patient with printed educational materials related to Meal Planning Using the Plate Method and Portion Control . Advised patient, providing education and  rationale, to check cbg daily before meals and record, calling the CCM team and or Dr. Baird Cancer for findings outside established parameters.  <80 or >250 . Discussed patient is checking FBS each am with ACCU-CHEK glucometer, today FBS was 91, yesterday it was 158, patient states her average is in the low 100's  Patient Self Care Activities:  . Attends all scheduled provider appointments . Performs ADL's independently . Calls provider office for new concerns or questions . Unable to verbalize appropriate diet restrictions in relation to health conditions  Please see past updates related to this goal by clicking on the "Past Updates" button in the selected goal      . "I have Congestive Heart Failue" (pt-stated)     Current Barriers:  Marland Kitchen Knowledge Deficits related to Self health management for CHF  Nurse Case Manager Clinical Goal(s):  Marland Kitchen Over the next 30 days, patient will work with the CCM RN to address needs related to CHF . New 10/192020: Over the next 30 days the patient will work with CCM RN to understand the importance of daily weights  CCM SW Interventions: Completed 08/13/2019 . Outbound call to the patient to assist with care coordination of patient goal . Reviewed previous interventions and assessed for ongoing or worsening heart palpitation symptoms . Determined the patient has yet to experience heart palpitations upon recent medication change "ever since I stopped taking the half pill and started the whole pill I have not noticed them" . Encouraged the patient to continue taking medication as directed to better manage her ongoing heart failure . Collaboration with embedded RN Case  Manager regarding today's intervention  CCM RN CM Interventions:  07/03/19 call completed with patient  . Evaluation of current treatment plan related to CHF and patient's adherence to plan as established by provider. . Advised patient to keep Cardiologist and or PCP well informed of new or worsening heart palpitation, chest pain and or shortness of breath; discussed patient f/u with Kerin Ransom PA-C on 07/02/19 with noted medication changes and recommendations to f/u with him again in 1 month . Reviewed medications with patient and discussed Kerin Ransom PA-C made the following medication changes; STOP Norvasc (Amlodipine); START Toprol (Metoprolol Succinate) 25 mg Take 1 tablet once a day, patient reminded if she needs a refill on her cardiac medications before her next appointment, she should contact her pharmacy to assist; patient verbalizes understanding and has made this medication change; discussed patient's BP today is 133/72 and O2 sat is 98% . Discussed plans with patient for ongoing care management follow up and provided patient with direct contact information for care management team . Advised patient, providing education and rationale, to weigh daily and record, calling the CCM team and MD Baird Cancer or Gwenlyn Found) for weight gain of 3lbs overnight or 5 pounds in a week.  . Discussed patient has not yet received her printed mailed patient education materials to review  Patient Self Care Activities:  . Self administers medications as prescribed . Attends all scheduled provider appointments . Calls pharmacy for medication refills . Attends church or other social activities . Performs ADL's independently . Performs IADL's independently . Calls provider office for new concerns or questions  Please see past updates related to this goal by clicking on the "Past Updates" button in the selected goal      . "I just feel weak" (pt-stated)     Current Barriers:  Marland Kitchen Knowledge Deficits related to  acute onset of "weakness"   Nurse  Case Manager Clinical Goal(s):  Marland Kitchen Over the next 14 days, patient will report the acute onset of weakness has resolved as evidence by patient will have increased energy and will be able to complete her daily activities w/o difficulty or fatigue. GOAL MET . Over the next 30 days, patient will not experience ED visits and or IP admission.   CCM RN CM Interventions:  07/03/19 call completed with the patient   . Evaluation of current treatment plan related to new onset of weakness and patient's adherence to plan as established by provider . Discussed patient feels stronger and she continues to work with in home PT - reports finding this therapy effective and her overall stamina has improved . Instructed patient to continue to stay well hydrated and to notify the CCM team and or Dr. Baird Cancer if the weakness reoccurs . Discussed plans with patient for ongoing care management follow up and provided patient with direct contact information for care management team  CCM SW Interventions Completed 08/13/2019 . Outbound call to the patient to review progression of patient goal . Determined the patient continues to work with PT and OT through home health o "I still feel a little weak but it is getting better. My appetite is improving" . Discussed the importance of the patient to continue drinking water and eating a balanced diet. The patient endorses consistent use of all prescribed medications . Encouraged the patient to contact her provider with any future health concenrs  Patient Self Care Activities:  . Self administers medications as prescribed . Attends all scheduled provider appointments . Calls pharmacy for medication refills . Performs ADL's independently . Performs IADL's independently . Calls provider office for new concerns or questions   Please see past updates related to this goal by clicking on the "Past Updates" button in the selected goal      .  Assist with care coordination by conducting social determinants of health screen     Current Barriers:  Marland Kitchen Knowledge Barriers related to resources and support available to address needs related to Chronic Care Management and challenges surrounding Social Determinants of Health  Clinical Social Work Clinical Goal(s):   Over the next 20 days, the patient will understand the role of the CCM team and work with SW to complete SDOH (Social Determinants of Health) screen. Completed  New 06/29/2019 Over the next 45 days the patient will work with SW to review SDOH and follow up with identified resource needs  CCM SW Interventions: Completed 08/13/2019  Outbound call placed to review SDOH screening tool and assist with any current challenges  No challenges identified during today's encounter  Reviewed patient decision to decline referral to meals on wheels program at this time due to continued support from her neighbors providing meals  Encouraged the patient to eat regular meals to assist with diabetes management  Discussed plan to contact the patient over the next 60 days to assist with care coordination needs  Patient Self Care Activities:   Calls provider office with new concerns  Performs ADL's independently  See past updates    . I would like to manage my chronic conditions (pt-stated)     A. DM  i. Patient states she has concerns regarding checking her BG, specifically about obtaining enough blood and feeling that BG lancet injections are painful. She inquires about possibility of using [employer].  ii. BG readings: 90s; only goes above 100 when she "overindulges"  iii. Hypoglycemia: No BG <70; no s/sx of  hypoglycemia  iv. Diet: 2 meals (breakfast, dinner) 2 snacks (lunch, after dinner)   1. Breakfast: cereal, sausage or [place] bacon, banana  2. Dinner: greens, turkey/chicken  3. Snacks: graham crackers, chicken, potato salad, macaroni, peanut butter/jelly sandwich  4.  Drinks: cherry juice, coffee, hot chocolate, water, milk  5. Exercise: numbness in left leg, tries to walk up the hallway at [facility], uses roller chair, not walking frequently b/c she almost passed out   vi. HbA1c (06/03/19): 6.9  vii. EGFR (06/11/19): 33  viii. ASCVD risk: cannot calculate (age)  ix. Current meds: insulin glargine (Lantus) 12 units QHS   1. Affordability: Pt receives all meds through [address] for a $0 copay except for allopurinol and colchicine which she receives through [employer]  x. Prior meds: N/A  xi. ACEi/ARB, ASA81, statin? yes, yes, yes  xii. Foot exam: N/A  xiii. Eye exam: retinopathy (01/2019)  xiv. HBA1c goal <8% (FPG 90-150; Bedtime 100-180)   xv. Plan:  1. Continue insulin glargine (Lantus) 12 units QHS  2.Informed patient that Freestyle Elenor Legato is not covered via insurance and the cash price ~$130 for transmitter and $90 for 30 day supply of sensor. Patient would prefer to check BG manually with BG meter, BG test strips, and lancing device at this time. Plan for patient to check fasting blood glucose every other day or every 2-3 days considering patient's current pain level with checking blood glucose and controlled HbA1c.   B. Gout  - Patient states she is currently taking allopurinol 300 mg daily and colchicine 0.6 mg sporadically (rather than onset of gout flare per instructions at last rheumatology appt) "to prevent gout attacks". Her uric acid level on 06/11/2019 was 5.5 and she has not had a recent gout attack.  -CrCl 20 mL/min -eGFR 33 (06/11/2019) Plan: 1. Recommend consideration of dose reduction of allopurinol to 200 mg daily 2. Recommend consideration of educating patient to take colchicine 0.6 at onset of gout flare (can take an additional 0.6 mg (total = 1.2 mg) if she does not find initial 0.6 mg beneficial) and to not repeat within 2 weeks.  C. Pain -Patient has been seeing occupational therapy. During her management, she has used Voltaren gel  1% to manage pain on her hands and finds this beneficial. She was wondering if possible she could obtain a prescription for further use of Voltaren gel 1%. Plan: 1. Defer voltaren gel 1% prescription to expertise of rheumatology      Initial goal documentation     . Patient Stated (pt-stated)     No goals    . Patient Stated     01/06/2020, no goals       Fall Risk Fall Risk  01/06/2020 06/03/2019 03/03/2019 12/31/2018 11/20/2018  Falls in the past year? 0 0 0 0 0  Risk for fall due to : Impaired balance/gait;Medication side effect - - Impaired balance/gait;Impaired vision;Medication side effect -  Follow up Falls evaluation completed;Education provided;Falls prevention discussed - - Education provided;Falls prevention discussed -   Is the patient's home free of loose throw rugs in walkways, pet beds, electrical cords, etc?   yes      Grab bars in the bathroom? yes      Handrails on the stairs?   n/a      Adequate lighting?   yes  Timed Get Up and Go performed: n/a  Depression Screen PHQ 2/9 Scores 01/06/2020 06/03/2019 03/03/2019 12/31/2018  PHQ - 2 Score 0 0 0 1  PHQ- 9 Score 3 - - 7     Cognitive Function     6CIT Screen 01/06/2020 12/31/2018  What Year? 0 points 0 points  What month? 0 points 0 points  What time? 0 points 0 points  Count back from 20 0 points 0 points  Months in reverse 2 points 2 points  Repeat phrase 4 points 2 points  Total Score 6 4    Immunization History  Administered Date(s) Administered  . Influenza, High Dose Seasonal PF 06/25/2018, 06/03/2019  . PFIZER SARS-COV-2 Vaccination 11/24/2019, 12/15/2019  . Pneumococcal Conjugate-13 01/26/2015    Qualifies for Shingles Vaccine? yes  Screening Tests Health Maintenance  Topic Date Due  . FOOT EXAM  01/09/2020  . OPHTHALMOLOGY EXAM  01/12/2020  . HEMOGLOBIN A1C  03/14/2020  . INFLUENZA VACCINE  04/10/2020  . TETANUS/TDAP  11/02/2027  . DEXA SCAN  Completed  . COVID-19 Vaccine  Completed    . PNA vac Low Risk Adult  Completed    Cancer Screenings: Lung: Low Dose CT Chest recommended if Age 43-80 years, 30 pack-year currently smoking OR have quit w/in 15years. Patient does not qualify. Breast:  Up to date on Mammogram? Yes   Up to date of Bone Density/Dexa? Yes Colorectal: not required  Additional Screenings: : Hepatitis C Screening: n/a     Plan:    Patient has no goals set at this time.   I have personally reviewed and noted the following in the patient's chart:   . Medical and social history . Use of alcohol, tobacco or illicit drugs  . Current medications and supplements . Functional ability and status . Nutritional status . Physical activity . Advanced directives . List of other physicians . Hospitalizations, surgeries, and ER visits in previous 12 months . Vitals . Screenings to include cognitive, depression, and falls . Referrals and appointments  In addition, I have reviewed and discussed with patient certain preventive protocols, quality metrics, and best practice recommendations. A written personalized care plan for preventive services as well as general preventive health recommendations were provided to patient.     Kellie Simmering, LPN  [id number]

## 2020-01-07 LAB — BMP8+EGFR
BUN/Creatinine Ratio: 29 — ABNORMAL HIGH (ref 12–28)
BUN: 46 mg/dL — ABNORMAL HIGH (ref 10–36)
CO2: 27 mmol/L (ref 20–29)
Calcium: 9.2 mg/dL (ref 8.7–10.3)
Chloride: 97 mmol/L (ref 96–106)
Creatinine, Ser: 1.6 mg/dL — ABNORMAL HIGH (ref 0.57–1.00)
GFR calc Af Amer: 32 mL/min/{1.73_m2} — ABNORMAL LOW (ref >59)
GFR calc non Af Amer: 28 mL/min/{1.73_m2} — ABNORMAL LOW (ref >59)
Glucose: 243 mg/dL — ABNORMAL HIGH (ref 65–99)
Potassium: 4.5 mmol/L (ref 3.5–5.2)
Sodium: 140 mmol/L (ref 134–144)

## 2020-01-07 LAB — LIPID PANEL
Chol/HDL Ratio: 2.1 ratio (ref 0.0–4.4)
Cholesterol, Total: 165 mg/dL (ref 100–199)
HDL: 78 mg/dL (ref >39)
LDL Chol Calc (NIH): 71 mg/dL (ref 0–99)
Triglycerides: 84 mg/dL (ref 0–149)
VLDL Cholesterol Cal: 16 mg/dL (ref 5–40)

## 2020-01-07 LAB — HEMOGLOBIN A1C
Est. average glucose Bld gHb Est-mCnc: 200 mg/dL
Hgb A1c MFr Bld: 8.6 % — ABNORMAL HIGH (ref 4.8–5.6)

## 2020-01-07 LAB — PTH, INTACT AND CALCIUM: PTH: 86 pg/mL — ABNORMAL HIGH (ref 15–65)

## 2020-01-07 LAB — PHOSPHORUS: Phosphorus: 3.7 mg/dL (ref 3.0–4.3)

## 2020-01-08 ENCOUNTER — Telehealth (INDEPENDENT_AMBULATORY_CARE_PROVIDER_SITE_OTHER): Payer: Medicare Other | Admitting: Cardiology

## 2020-01-08 ENCOUNTER — Telehealth: Payer: Self-pay | Admitting: Rheumatology

## 2020-01-08 ENCOUNTER — Encounter: Payer: Self-pay | Admitting: Cardiology

## 2020-01-08 VITALS — BP 132/79 | HR 80 | Ht 59.0 in | Wt 154.1 lb

## 2020-01-08 DIAGNOSIS — M2550 Pain in unspecified joint: Secondary | ICD-10-CM | POA: Diagnosis not present

## 2020-01-08 DIAGNOSIS — R002 Palpitations: Secondary | ICD-10-CM | POA: Diagnosis not present

## 2020-01-08 DIAGNOSIS — N1832 Chronic kidney disease, stage 3b: Secondary | ICD-10-CM

## 2020-01-08 DIAGNOSIS — E119 Type 2 diabetes mellitus without complications: Secondary | ICD-10-CM | POA: Diagnosis not present

## 2020-01-08 DIAGNOSIS — G4733 Obstructive sleep apnea (adult) (pediatric): Secondary | ICD-10-CM

## 2020-01-08 DIAGNOSIS — M0600 Rheumatoid arthritis without rheumatoid factor, unspecified site: Secondary | ICD-10-CM

## 2020-01-08 DIAGNOSIS — N183 Chronic kidney disease, stage 3 unspecified: Secondary | ICD-10-CM | POA: Insufficient documentation

## 2020-01-08 DIAGNOSIS — I10 Essential (primary) hypertension: Secondary | ICD-10-CM | POA: Diagnosis not present

## 2020-01-08 DIAGNOSIS — R079 Chest pain, unspecified: Secondary | ICD-10-CM

## 2020-01-08 DIAGNOSIS — Z9989 Dependence on other enabling machines and devices: Secondary | ICD-10-CM

## 2020-01-08 DIAGNOSIS — Z794 Long term (current) use of insulin: Secondary | ICD-10-CM

## 2020-01-08 MED ORDER — HYDROXYCHLOROQUINE SULFATE 200 MG PO TABS: 200.0000 mg | ORAL_TABLET | Freq: Every day | ORAL | 0 refills | Status: DC

## 2020-01-08 NOTE — Patient Instructions (Signed)
Medication Instructions:  Your physician recommends that you continue on your current medications as directed. Please refer to the Current Medication list given to you today.  *If you need a refill on your cardiac medications before your next appointment, please call your pharmacy*  Follow-Up: At [facility], you and your health needs are our priority.  As part of our continuing mission to provide you with exceptional heart care, we have created designated Provider Care Teams.  These Care Teams include your primary Cardiologist (physician) and Advanced Practice Providers (APPs -  Physician Assistants and Nurse Practitioners) who all work together to provide you with the care you need, when you need it.  We recommend signing up for the patient portal called "MyChart".  Sign up information is provided on this After Visit Summary.  MyChart is used to connect with patients for Virtual Visits (Telemedicine).  Patients are able to view lab/test results, encounter notes, upcoming appointments, etc.  Non-urgent messages can be sent to your provider as well.   To learn more about what you can do with MyChart, go to [email or web address].    Your next appointment:   6 month(s)  The format for your next appointment:   Either In Person or Virtual  Provider:   Quay Burow, MD

## 2020-01-08 NOTE — Telephone Encounter (Signed)
Patient needs refill on Generic Plaquenil, if you want her to continue on medication. Patient would like 90 day supply, sent to [facility]. Please call to advise if Lovena Le wants her to continue medication.

## 2020-01-08 NOTE — Telephone Encounter (Signed)
Last Visit: 11/26/2019 Next Visit: 01/27/2020 Labs: 11/26/2019 RBC count, Hgb, and Hct are low and trending down. Her anemia is likely due to chronic kidney disease.Creatinine remains elevated and GFR is low and trending down. Her GFR was 28. Please advise the patient to discontinue colchicine. Glucose is elevated-185.  Eye exam: no eye exam on file.   I will contact patient about eye exam.   Okay to refill plaquenil?

## 2020-01-08 NOTE — Telephone Encounter (Signed)
Advised patient that plaquenil refill has been sent to the pharmacy and no changes are being made at this time. Patient is scheduled with Dr. Zigmund Daniel on 01/12/2020 for a plaquenil eye exam.

## 2020-01-08 NOTE — Telephone Encounter (Signed)
Ok to refill PLQ.

## 2020-01-08 NOTE — Progress Notes (Signed)
Virtual Visit via Telephone Note   This visit type was conducted due to national recommendations for restrictions regarding the COVID-19 Pandemic (e.g. social distancing) in an effort to limit this patient's exposure and mitigate transmission in our community.  Due to her co-morbid illnesses, this patient is at least at moderate risk for complications without adequate follow up.  This format is felt to be most appropriate for this patient at this time.  The patient did not have access to video technology/had technical difficulties with video requiring transitioning to audio format only (telephone).  All issues noted in this document were discussed and addressed.  No physical exam could be performed with this format.  Please refer to the patient's chart for her  consent to telehealth for [facility].   The patient was identified using 2 identifiers.  Date:  01/08/2020   ID:  Maria [name], DOB 1925-10-12, MRN [id number]  Patient Location: Home Provider Location: Home  PCP:  Glendale Chard, MD  Cardiologist:  Quay Burow, MD  Electrophysiologist:  None   Evaluation Performed:  Follow-Up Visit  Chief Complaint:  none  History of Present Illness:    Maria [name] is a delightful  86 y.o. female with a hx of chronic LE edema,HCVD, diastolic CHF, sleep apnea on C-pap, and prior normal coronaries and normal LVF. Her last echo was Aug 2020 and showedhyperdynamic systolic function, with an ejection fraction of >65%. The cavity size was normal. severe basal septal hypertrophy. Left ventricular diastolic Doppler parameters are consistent with impaired relaxation.  She had problems with palpitations in Sept 2020 and Dr Gwenlyn Found ordered a ZIO. She was placed on Toprol then which improved her symptoms.  The monitor was returned but was unused, she apparently did not activate it.   She was contacted for a virtual follow up 12/03/2019. She has been having problems with arthritis .  I  suggested a statin holiday and she was contacted today for follow up. She tells me she could tell no change of the statin and is now back on it.   The patient does not have symptoms concerning for COVID-19 infection (fever, chills, cough, or new shortness of breath).    Past Medical History:  Diagnosis Date  . Anxiety   . Arthritis   . Blood transfusion   . Depression   . Diabetes mellitus   . Edema   . Heart murmur   . Hyperlipidemia   . Hypertension   . Sleep apnea    cpap  . Stroke ([place])    3 x tias  . TIA (transient ischemic attack)    Past Surgical History:  Procedure Laterality Date  . ABDOMINAL HYSTERECTOMY    . APPENDECTOMY    . NM MYOVIEW LTD  [id number]   post stress left ventricle is normal in size, ejection fraction is 65%, normal myocardial perfusion study, low risk scan  . PARTIAL HYSTERECTOMY    . TRANSTHORACIC ECHOCARDIOGRAM  [id number]     Current Meds  Medication Sig  . Accu-Chek FastClix Lancets MISC CHECK BLOOD SUGAR BEFORE BREAKFAST AND DINNER  . ACCU-CHEK SMARTVIEW test strip TEST TWICE DAILY BEFORE BREAKFAST AND DINNER  . allopurinol (ZYLOPRIM) 100 MG tablet Take 2 tablets (200 mg total) by mouth daily.  Marland Kitchen amLODipine (NORVASC) 5 MG tablet   . aspirin 81 MG tablet Take 1 tablet (81 mg total) by mouth daily.  Marland Kitchen atorvastatin (LIPITOR) 40 MG tablet TAKE 1 TABLET DAILY  . Cholecalciferol (VITAMIN D) 2000 UNITS  tablet Take 2,000 Units by mouth daily.  . citalopram (CELEXA) 20 MG tablet Take 20 mg by mouth daily.  . diclofenac Sodium (VOLTAREN) 1 % GEL Apply 2 g topically 4 (four) times daily.  Marland Kitchen docusate sodium (COLACE) 100 MG capsule Take 100 mg by mouth daily.  Marland Kitchen donepezil (ARICEPT) 10 MG tablet TAKE 1 TABLET DAILY IN THE EVENING  . ezetimibe (ZETIA) 10 MG tablet TAKE 1 TABLET AT BEDTIME  . FOLBIC 2.5-25-2 MG TABS tablet TAKE 1 TABLET DAILY  . furosemide (LASIX) 40 MG tablet TAKE 1 TABLET TWICE A DAY (NEED TO SCHEDULE AN APPOINTMENT)  . hydrALAZINE  (APRESOLINE) 25 MG tablet TAKE 1 TABLET THREE TIMES A DAY  . hydroxychloroquine (PLAQUENIL) 200 MG tablet Take 1 tablet (200 mg total) by mouth daily.  . Insulin Pen Needle (BD PEN NEEDLE MICRO U/F) 32G X 6 MM MISC Use as directed  . isosorbide mononitrate (IMDUR) 30 MG 24 hr tablet Take 1 tablet (30 mg total) by mouth daily.  Marland Kitchen LANTUS SOLOSTAR 100 UNIT/ML Solostar Pen INJECT 20 UNITS AT NIGHT UNDER THE SKIN AS PER INSULIN PROTOCOL  . loratadine (CLARITIN) 10 MG tablet Take 10 mg by mouth every morning.  . Magnesium 250 MG TABS Take by mouth at bedtime.  . metolazone (ZAROXOLYN) 2.5 MG tablet TAKE 1 TABLET EVERY OTHER DAY 30 MINUTES PRIOR TO YOUR MORNING LASIX DOSE  . metoprolol succinate (TOPROL-XL) 25 MG 24 hr tablet TAKE 1 TABLET(25 MG) BY MOUTH DAILY  . potassium chloride (KLOR-CON) 10 MEQ tablet   . predniSONE (DELTASONE) 2.5 MG tablet Take 3 tablets (7.5 mg total) by mouth daily with breakfast.  . telmisartan (MICARDIS) 80 MG tablet TAKE 1 TABLET DAILY     Allergies:   Ace inhibitors and Valsartan   Social History   Tobacco Use  . Smoking status: Never Smoker  . Smokeless tobacco: Never Used  Substance Use Topics  . Alcohol use: No  . Drug use: No     Family Hx: The patient's family history includes Healthy in her mother; Prostate cancer in her father.  ROS:   Please see the history of present illness.    All other systems reviewed and are negative.   Prior CV studies:   The following studies were reviewed today: Echo Aug 2020- IMPRESSIONS    1. The left ventricle has hyperdynamic systolic function, with an  ejection fraction of >65%. The cavity size was normal. severe basal septal  hypertrophy. Left ventricular diastolic Doppler parameters are consistent  with impaired relaxation.  Indeterminate filling pressures.  2. The right ventricle has normal systolic function. The cavity was  normal. There is no increase in right ventricular wall thickness. Right    ventricular systolic pressure is normal with an estimated pressure of 29.9  mmHg.  3. The aortic valve is tricuspid. Mild sclerosis of the aortic valve.  Aortic valve regurgitation is trivial by color flow Doppler. Moderate  aortic annular calcification noted.  4. There is mild to moderate mitral annular calcification present.  5. The aorta is normal in size and structure.  6. The interatrial septum appears to be lipomatous.   Labs/Other Tests and Data Reviewed:    EKG:  An ECG dated 07/02/2019 was personally reviewed today and demonstrated:  NSR, HR 97, RBBB  Recent Labs: 06/03/2019: Magnesium 1.5; TSH 2.740 11/26/2019: ALT 18; Hemoglobin 8.9; Platelets 237 01/06/2020: BUN 46; Creatinine, Ser 1.60; Potassium 4.5; Sodium 140   Recent Lipid Panel Lab Results  Component  Value Date/Time   CHOL 165 01/06/2020 12:03 PM   TRIG 84 01/06/2020 12:03 PM   HDL 78 01/06/2020 12:03 PM   CHOLHDL 2.1 01/06/2020 12:03 PM   LDLCALC 71 01/06/2020 12:03 PM    Wt Readings from Last 3 Encounters:  01/08/20 154 lb 1.6 oz (69.9 kg)  01/06/20 159 lb 12.8 oz (72.5 kg)  01/06/20 159 lb 12.8 oz (72.5 kg)     Objective:    Vital Signs:  BP 132/79   Pulse 80   Ht 4\' 11"  (1.499 m)   Wt 154 lb 1.6 oz (69.9 kg)   BMI 31.12 kg/m    VITAL SIGNS:  reviewed  ASSESSMENT & PLAN:    Palpitations ZIO never completed. The addition of Toprol 25 mg improved her symptoms.  Hypertensive cardiovascular disease LVH, DD, normal LVF on echo  Obstructive sleep apnea on CPAP She has not been on C-papsecondary to complications  Chest pain History of chest pain with normal coronariesFeb 2012  Insulin dependent type 2 diabetes mellitus ([place]) Per PCP  Arthritis- Followed by Rheumatology- no change with statin holiday and she has resumed Lipitor.  Her LDL was 71 on 01/06/2020.  CRI- stage 3-4, GFR 28 on 01/06/2020- PCP is following.  Plan:  Same Rx- f/u Dr Gwenlyn Found in 6 months.    COVID-19  Education: The signs and symptoms of COVID-19 were discussed with the patient and how to seek care for testing (follow up with PCP or arrange E-visit).  The importance of social distancing was discussed today.  Time:   Today, I have spent 10 minutes with the patient with telehealth technology discussing the above problems.     Medication Adjustments/Labs and Tests Ordered: Current medicines are reviewed at length with the patient today.  Concerns regarding medicines are outlined above.   Tests Ordered: No orders of the defined types were placed in this encounter.   Medication Changes: No orders of the defined types were placed in this encounter.   Follow Up:  In Person with Dr Truitt Leep months.   Signed, Kerin Ransom, PA-C  01/08/2020 11:40 AM    [facility]

## 2020-01-10 ENCOUNTER — Other Ambulatory Visit: Payer: Self-pay | Admitting: Internal Medicine

## 2020-01-10 DIAGNOSIS — I130 Hypertensive heart and chronic kidney disease with heart failure and stage 1 through stage 4 chronic kidney disease, or unspecified chronic kidney disease: Secondary | ICD-10-CM

## 2020-01-10 DIAGNOSIS — Z794 Long term (current) use of insulin: Secondary | ICD-10-CM

## 2020-01-10 DIAGNOSIS — E1122 Type 2 diabetes mellitus with diabetic chronic kidney disease: Secondary | ICD-10-CM

## 2020-01-10 DIAGNOSIS — I5032 Chronic diastolic (congestive) heart failure: Secondary | ICD-10-CM

## 2020-01-10 DIAGNOSIS — N183 Chronic kidney disease, stage 3 unspecified: Secondary | ICD-10-CM

## 2020-01-12 ENCOUNTER — Encounter: Payer: Self-pay | Admitting: Internal Medicine

## 2020-01-12 ENCOUNTER — Ambulatory Visit: Payer: Self-pay

## 2020-01-12 ENCOUNTER — Encounter (INDEPENDENT_AMBULATORY_CARE_PROVIDER_SITE_OTHER): Payer: Medicare Other | Admitting: Ophthalmology

## 2020-01-12 DIAGNOSIS — E113293 Type 2 diabetes mellitus with mild nonproliferative diabetic retinopathy without macular edema, bilateral: Secondary | ICD-10-CM

## 2020-01-12 DIAGNOSIS — I10 Essential (primary) hypertension: Secondary | ICD-10-CM

## 2020-01-12 DIAGNOSIS — E1122 Type 2 diabetes mellitus with diabetic chronic kidney disease: Secondary | ICD-10-CM

## 2020-01-12 DIAGNOSIS — Z794 Long term (current) use of insulin: Secondary | ICD-10-CM

## 2020-01-12 DIAGNOSIS — H353131 Nonexudative age-related macular degeneration, bilateral, early dry stage: Secondary | ICD-10-CM

## 2020-01-12 DIAGNOSIS — H35033 Hypertensive retinopathy, bilateral: Secondary | ICD-10-CM | POA: Diagnosis not present

## 2020-01-12 DIAGNOSIS — M1711 Unilateral primary osteoarthritis, right knee: Secondary | ICD-10-CM

## 2020-01-12 DIAGNOSIS — E11319 Type 2 diabetes mellitus with unspecified diabetic retinopathy without macular edema: Secondary | ICD-10-CM | POA: Diagnosis not present

## 2020-01-12 DIAGNOSIS — H43813 Vitreous degeneration, bilateral: Secondary | ICD-10-CM

## 2020-01-12 LAB — HM DIABETES EYE EXAM

## 2020-01-13 ENCOUNTER — Telehealth: Payer: Medicare Other

## 2020-01-13 ENCOUNTER — Other Ambulatory Visit: Payer: Self-pay

## 2020-01-15 ENCOUNTER — Other Ambulatory Visit: Payer: Self-pay

## 2020-01-15 ENCOUNTER — Ambulatory Visit: Payer: Medicare Other | Admitting: Podiatry

## 2020-01-15 ENCOUNTER — Encounter: Payer: Self-pay | Admitting: Podiatry

## 2020-01-15 DIAGNOSIS — M2011 Hallux valgus (acquired), right foot: Secondary | ICD-10-CM

## 2020-01-15 DIAGNOSIS — E1151 Type 2 diabetes mellitus with diabetic peripheral angiopathy without gangrene: Secondary | ICD-10-CM

## 2020-01-15 DIAGNOSIS — M2041 Other hammer toe(s) (acquired), right foot: Secondary | ICD-10-CM

## 2020-01-15 DIAGNOSIS — E1142 Type 2 diabetes mellitus with diabetic polyneuropathy: Secondary | ICD-10-CM

## 2020-01-15 DIAGNOSIS — B351 Tinea unguium: Secondary | ICD-10-CM | POA: Diagnosis not present

## 2020-01-15 DIAGNOSIS — M2042 Other hammer toe(s) (acquired), left foot: Secondary | ICD-10-CM

## 2020-01-15 DIAGNOSIS — M79676 Pain in unspecified toe(s): Secondary | ICD-10-CM

## 2020-01-15 NOTE — Progress Notes (Signed)
This patient returns to my office for at risk foot care.  This patient requires this care by a professional since this patient will be at risk due to having diabetes with angiopathy and neuropathy and CKD.    This patient is unable to cut nails herself since the patient cannot reach her nails.These nails are painful walking and wearing shoes.  This patient presents for at risk foot care today.  General Appearance  Alert, conversant and in no acute stress.  Vascular  Dorsalis pedis and posterior tibial weakly  pulses are palpable  bilaterally.  Capillary return is within normal limits  bilaterally. Temperature is within normal limits  bilaterally.  Neurologic  Senn-Weinstein monofilament wire test diminished  bilaterally. Muscle power within normal limits bilaterally.  Nails Thick disfigured discolored nails with subungual debris  from hallux to fifth toes bilaterally. No evidence of bacterial infection or drainage bilaterally.  Orthopedic  No limitations of motion  feet .  No crepitus or effusions noted.  No bony pathology or digital deformities noted.  Skin  normotropic skin with no porokeratosis noted bilaterally.  No signs of infections or ulcers noted.     Onychomycosis  Pain in right toes  Pain in left toes  Consent was obtained for treatment procedures.   Mechanical debridement of nails 1-5  bilaterally performed with a nail nipper.  Filed with dremel without incident.    Return office visit  10 weeks                    Told patient to return for periodic foot care and evaluation due to potential at risk complications.   Gardiner Barefoot DPM

## 2020-01-18 ENCOUNTER — Other Ambulatory Visit: Payer: Self-pay | Admitting: Cardiovascular Disease

## 2020-01-19 ENCOUNTER — Ambulatory Visit: Payer: Self-pay

## 2020-01-20 ENCOUNTER — Ambulatory Visit (INDEPENDENT_AMBULATORY_CARE_PROVIDER_SITE_OTHER): Payer: Medicare Other

## 2020-01-20 ENCOUNTER — Ambulatory Visit: Payer: Self-pay

## 2020-01-20 ENCOUNTER — Other Ambulatory Visit: Payer: Self-pay

## 2020-01-20 DIAGNOSIS — E782 Mixed hyperlipidemia: Secondary | ICD-10-CM | POA: Diagnosis not present

## 2020-01-20 DIAGNOSIS — I130 Hypertensive heart and chronic kidney disease with heart failure and stage 1 through stage 4 chronic kidney disease, or unspecified chronic kidney disease: Secondary | ICD-10-CM | POA: Diagnosis not present

## 2020-01-20 DIAGNOSIS — N183 Chronic kidney disease, stage 3 unspecified: Secondary | ICD-10-CM | POA: Diagnosis not present

## 2020-01-20 DIAGNOSIS — E1122 Type 2 diabetes mellitus with diabetic chronic kidney disease: Secondary | ICD-10-CM | POA: Diagnosis not present

## 2020-01-20 DIAGNOSIS — Z794 Long term (current) use of insulin: Secondary | ICD-10-CM

## 2020-01-20 DIAGNOSIS — I5032 Chronic diastolic (congestive) heart failure: Secondary | ICD-10-CM | POA: Diagnosis not present

## 2020-01-20 NOTE — Progress Notes (Signed)
Office Visit Note  Patient: Maria [name]             Date of Birth: 1926/06/28           MRN: [id number]             PCP: Maria Chard, MD Referring: Maria Chard, MD Visit Date: 01/27/2020 Occupation: @GUAROCC @  Subjective:  Right knee joint pain   History of Present Illness: GLORIANA [name] is a 86 y.o. female with history of seronegative arthritis, osteoarthritis, and gout.  She is taking Plaquenil 200 mg 1 tablet by mouth daily.  She is tolerating Plaquenil without any side effects.  She has noticed a significant improvement while taking Plaquenil and prednisone.  She is currently on prednisone 7.5 mg daily.  She has had to increase her dose of insulin due to the long-term prednisone use and is curious when she can start tapering the prednisone dose.  She states that the pain in her hands has improved significantly.  She is able to make a complete fist.  She has been performing hand exercises on a daily basis.  She has been having increased pain and inflammation in the right knee joint for the past 4 days.  She states in the past she has received cortisone injections as well as Visco gel injections performed by Dr. Lynann Bologna.  According to the patient Dr. Lynann Bologna has retired and she would like [name] to take over Visco gel injections for the right knee.  She continues to use a walker to assist with ambulation.  She is also been taking Aleve and uses Voltaren gel sparingly for pain relief.  She denies any recent gout flares.  She is no longer taking allopurinol or colchicine due to elevated creatinine and low GFR.  She has not established care with a nephrologist yet.    Activities of Daily Living:  Patient reports morning stiffness for 0  minutes.   Patient Denies nocturnal pain.  Difficulty dressing/grooming: Denies Difficulty climbing stairs: Reports Difficulty getting out of chair: Reports Difficulty using hands for taps, buttons, cutlery, and/or writing: Reports  Review of Systems    Constitutional: Positive for fatigue.  HENT: Negative for mouth sores, mouth dryness and nose dryness.   Eyes: Negative for pain, visual disturbance and dryness.  Respiratory: Negative for cough, hemoptysis, shortness of breath and difficulty breathing.   Cardiovascular: Negative for chest pain, palpitations, hypertension and swelling in legs/feet.  Gastrointestinal: Negative for blood in stool, constipation and diarrhea.  Endocrine: Negative for increased urination.  Genitourinary: Negative for painful urination.  Musculoskeletal: Positive for arthralgias, joint pain and joint swelling. Negative for myalgias, muscle weakness, morning stiffness, muscle tenderness and myalgias.  Skin: Negative for color change, pallor, rash, hair loss, nodules/bumps, skin tightness, ulcers and sensitivity to sunlight.  Allergic/Immunologic: Negative for susceptible to infections.  Neurological: Negative for dizziness, numbness, headaches and weakness.  Hematological: Negative for swollen glands.  Psychiatric/Behavioral: Positive for depressed mood. Negative for sleep disturbance. The patient is not nervous/anxious.     PMFS History:  Patient Active Problem List   Diagnosis Date Noted  . Chronic renal disease, stage 3, moderately decreased glomerular filtration rate between 30-59 mL/min/1.73 square meter 01/08/2020  . Hypertensive heart and kidney disease with chronic diastolic congestive heart failure and stage 3 chronic kidney disease ([place]) 01/06/2020  . Primary osteoarthritis of right knee 01/06/2020  . Joint pain 12/03/2019  . Hav (hallux abducto valgus), right 11/04/2019  . Diabetic peripheral vascular disease (Maria [name]) 09/15/2019  .  Gout 07/02/2019  . Insulin dependent type 2 diabetes mellitus (Maria [name]) 07/02/2019  . Acute on chronic diastolic CHF (congestive heart failure) ([place]) 03/26/2018  . Acute on chronic renal insufficiency 03/26/2018  . Dyspnea on exertion 02/14/2018  . Palpitations 02/13/2017  .  Chest pain 03/25/2015  . Acute diverticulitis 03/04/2013  . Bilateral lower extremity edema 01/23/2013  . Hyperlipidemia 01/23/2013  . Diverticulitis 01/31/2012  . Diabetes mellitus (Maria [name]) 01/31/2012  . Essential hypertension 01/31/2012  . Obstructive sleep apnea on CPAP 01/31/2012  . History of TIAs 01/31/2012  . Obesity 01/31/2012  . H/O vertigo 01/31/2012    Past Medical History:  Diagnosis Date  . Anxiety   . Arthritis   . Blood transfusion   . Depression   . Diabetes mellitus   . Edema   . Heart murmur   . Hyperlipidemia   . Hypertension   . Sleep apnea    cpap  . Stroke (Maria [name])    3 x tias  . TIA (transient ischemic attack)     Family History  Problem Relation Age of Onset  . Healthy Mother   . Prostate cancer Father    Past Surgical History:  Procedure Laterality Date  . ABDOMINAL HYSTERECTOMY    . APPENDECTOMY    . NM MYOVIEW LTD  [id number]   post stress left ventricle is normal in size, ejection fraction is 65%, normal myocardial perfusion study, low risk scan  . PARTIAL HYSTERECTOMY    . TRANSTHORACIC ECHOCARDIOGRAM  [id number]   Social History   Social History Narrative   Has SCAT   Immunization History  Administered Date(s) Administered  . Influenza, High Dose Seasonal PF 06/25/2018, 06/03/2019  . PFIZER SARS-COV-2 Vaccination 11/24/2019, 12/15/2019  . Pneumococcal Conjugate-13 01/26/2015     Objective: Vital Signs: BP 114/60 (BP Location: Left Arm, Patient Position: Sitting, Cuff Size: Normal)   Pulse 69   Resp 18   Ht 4\' 11"  (1.499 m)   Wt 161 lb (73 kg)   BMI 32.52 kg/m    Physical Exam Vitals and nursing note reviewed.  Constitutional:      Appearance: She is well-developed.  HENT:     Head: Normocephalic and atraumatic.  Eyes:     Conjunctiva/sclera: Conjunctivae normal.  Pulmonary:     Effort: Pulmonary effort is normal.  Abdominal:     General: Bowel sounds are normal.     Palpations: Abdomen is soft.  Musculoskeletal:      Cervical back: Normal range of motion.  Lymphadenopathy:     Cervical: No cervical adenopathy.  Skin:    General: Skin is warm and dry.     Capillary Refill: Capillary refill takes less than 2 seconds.  Neurological:     Mental Status: She is alert and oriented to person, place, and time.  Psychiatric:        Behavior: Behavior normal.      Musculoskeletal Exam: C-spine good ROM.  Thoracic kyphosis noted.  Shoulder joints slightly limited ROM with discomfort.  Elbow joints and wrist joints good ROM with no discomfort.  Synovial thickening of the 2nd, 3rd, and 4th MCP joints.  Tenderness of the right 2nd and 3rd MCP joints.  Right knee swelling but no warmth noted.  Painful and limited extension of the right knee joint.  Left knee joint has good ROM with no warmth or effusion. Pedal edema bilaterally.   CDAI Exam: CDAI Score: -- Patient Global: --; Provider Global: -- Swollen: --; Tender: -- Joint Exam 01/27/2020  No joint exam has been documented for this visit   There is currently no information documented on the homunculus. Go to the Rheumatology activity and complete the homunculus joint exam.  Investigation: No additional findings.  Imaging: No results found.  Recent Labs: Lab Results  Component Value Date   WBC 8.0 11/26/2019   HGB 8.9 (L) 11/26/2019   PLT 237 11/26/2019   NA 140 01/06/2020   K 4.5 01/06/2020   CL 97 01/06/2020   CO2 27 01/06/2020   GLUCOSE 243 (H) 01/06/2020   BUN 46 (H) 01/06/2020   CREATININE 1.60 (H) 01/06/2020   BILITOT 0.4 11/26/2019   ALKPHOS 108 09/15/2019   AST 18 11/26/2019   ALT 18 11/26/2019   PROT 6.2 11/26/2019   ALBUMIN 4.0 09/15/2019   CALCIUM 9.2 01/06/2020   GFRAA 32 (L) 01/06/2020    Speciality Comments: No specialty comments available.  Procedures:  No procedures performed Allergies: Ace inhibitors and Valsartan   Assessment / Plan:     Visit Diagnoses: Seronegative rheumatoid arthritis ([place]): She has tenderness  of the right second and third MCP joints.  Synovial thickening of bilateral second, third, and fourth MCP joints.  She is able to make a complete fist bilaterally.  She has noticed a significant improvement in her joint pain and inflammation in both hands and both wrists since starting on Plaquenil and prednisone.  She is currently taking Plaquenil 200 mg 1 tablet by mouth daily and prednisone 7.5 mg daily.  She has been performing hand exercises on a daily basis and uses a hand brace at times.  She presents today with pain and swelling in the right knee joint.  No warmth was noted on examination today.  She has painful and limited extension of the right knee.  She continues to use a walker to assist with ambulation.  She would like to apply for Synvisc 1 injections for the right knee joint.  She is not experiencing any other joint pain or inflammation at this time.  She will start tapering prednisone and will start at 5 mg daily for 1 month and then taper by 1 mg every month.  She will continue taking Plaquenil as prescribed.  She does not need any refills at this time.  She was advised to notify us if she develops increased joint pain or joint swelling as she is tapering prednisone.  She will follow-up in the office in 4 months.  High risk medication use -  Plaquenil 200 mg 1 tablet by mouth daily (Started in Feb 2021).  Plaquenil eye exam performed on 01/12/2020 and 5/172021 [facility].  BMP was drawn on 01/06/2020.  We will update CBC and CMP today.- Plan: CBC with Differential/Platelet, COMPLETE METABOLIC PANEL WITH GFR, Uric acid  Idiopathic chronic gout of multiple sites without tophus - She has not had any recent gout flares.  She is no longer taking allopurinol or colchicine.  Her uric acid level was 4.1 on 10/05/2019.  We will recheck uric acid level today.  She was encouraged to avoid purine rich foods.  According to the patient she had shrimp and lobster 2 weeks ago but did not  develop signs or symptoms of a flare at that time.  She was advised to discontinue colchicine due to elevated creatinine and low GFR.  Dr. Baird Cancer referred her to nephrology but she has not scheduled an appointment yet.  She was encouraged to schedule appointment with a nephrologist.  She was advised  against using Aleve.- Plan: Uric acid  Trigger finger, left index finger: Resolved.  Primary osteoarthritis of both hands: She has PIP and DIP thickening consistent with osteoarthritis of both hands.  She has been experiencing less discomfort and stiffness in both hands.  She has been performing hand exercises on a daily basis.  Joint protection and muscle strengthening were discussed.  Primary osteoarthritis of both knees: She presents today with increased pain in the right knee joint which started 4 days ago.  She has swelling but no warmth or effusion on exam.  She has painful and limited extension of the right knee.  According to the patient she previously had cortisone injections as well as Visco gel injections performed by Dr. Lynann Bologna. Dr. Lynann Bologna has retired and the patient requested for [name] to proceed with scheduling Visco gel injections for the right knee.  She requested Synvisc 1 since she has difficulty coordinating transportation and office visits.  We will notify her once it is approved.  She was encouraged to continue to use a walker to assist with ambulation.  She plans on continuing Voltaren gel topically as needed.  She was advised to avoid taking Aleve.  Primary osteoarthritis of both feet: She is not having any discomfort in her feet at this time.   Other medical conditions are listed as follows:   History of hyperlipidemia  History of diabetes mellitus, type II  History of diverticulitis  Acute on chronic renal insufficiency  Hypertensive heart disease with chronic diastolic congestive heart failure ([place])  Obstructive sleep apnea on CPAP  History of TIAs  Orders: Orders Placed  This Encounter  Procedures  . CBC with Differential/Platelet  . COMPLETE METABOLIC PANEL WITH GFR  . Uric acid   No orders of the defined types were placed in this encounter.   Face-to-face time spent with patient was 30 minutes. Greater than 50% of time was spent in counseling and coordination of care.  Follow-Up Instructions: Return in about 4 months (around 05/29/2020) for Rheumatoid arthritis, Gout.   Ofilia Neas, PA-C  Note - This record has been created using Dragon software.  Chart creation errors have been sought, but may not always  have been located. Such creation errors do not reflect on  the standard of medical care.

## 2020-01-22 NOTE — Chronic Care Management (AMB) (Signed)
   Chronic Care Management Pharmacy  Name: Maria [name]  MRN: [id number] DOB: 09-19-1925  Chief Complaint/ HPI  Maria [name],  86 y.o. , female presents for follow up phone call regarding elevated blood glucose and recent change of insulin from Lantus to [place].   PCP : Maria Chard, MD  Their chronic conditions include: Hypertension, CHF, Diabetes, CKD, Hyperlipidemia and Gout  Current Diagnosis/Assessment:  Diabetes   Recent Relevant Labs: Lab Results  Component Value Date/Time   HGBA1C 8.6 (H) 01/06/2020 12:03 PM   HGBA1C 6.7 (H) 09/15/2019 03:33 PM   MICROALBUR 10 09/15/2019 12:20 PM   MICROALBUR 10 03/03/2019 12:52 PM    Checking BG: 2x per Day  Blood Glucose 5/9 5/10 5/11 5/12 5/13 5/14  AM 137 116 188 120 105 113  PM before Insulin 199 326 290 292 277    Patient is currently uncontrolled on the following medications:  -Toujeo 14 units in the afternoon  We discussed -BG readings are still elevated after switching to Toujeo -Toujeo dose was increased to 14 units daily on 5/11 -Collaborated with PCP regarding increasing dose of Toujeo by 2 units  Plan -Increase Toujeo to 16 units daily on 5/18  Follow up: 3 days via phone  Jannette Fogo, PharmD Clinical Pharmacist [facility] [phone]

## 2020-01-22 NOTE — Chronic Care Management (AMB) (Signed)
   Chronic Care Management Pharmacy  Name: ALIXANDREA MILLESON  MRN: [id number] DOB: 06-Oct-1925  Chief Complaint/ HPI  [name],  86 y.o. , female presents for follow up phone call regarding elevated blood glucose and recent change of insulin from Lantus to [place].   PCP : Glend[place] Chard, MD  Their chronic conditions include: Hypertension, CHF, Diabetes, CKD, Hyperlipidemia and Gout  Current Diagnosis/Assessment:  Goals Addressed            This Visit's Progress   . Diabetes Management       CARE PLAN ENTRY  Current Barriers:  . Diabetes: Uncontrolled; complicated by chronic medical conditions including hypertension, hyperlipidemia, CKD Lab Results  Component Value Date   HGBA1C 8.6 (H) 01/06/2020 .   Lab Results  Component Value Date   CREATININE 1.60 (H) 01/06/2020   CREATININE 1.80 (H) 11/26/2019   CREATININE 1.60 (H) 09/15/2019   . Current antihyperglycemic regimen: Toujeo 12 units daily . Current blood glucose readings:  Blood Glucose 5/7 5/8 5/9 5/10 5/11  AM 149 127 139 116 116  PM before Insulin 277 217 199 326    Pharmacist Clinical Goal(s):  Marland Kitchen Over the next 3 days, patient will work with PharmD and primary care provider to address high blood sugar readings.  Interventions: . Increased Toujeo from 12 units to 14 units daily  Patient Self Care Activities:  . Patient will check blood glucose twice daily, document, and provide to PharmD in 3 days . Patient will administer 14 units of Toujeo daily and follow up with PharmD in 3 days . Patient will take medications as prescribed . Patient will contact provider with any episodes of hypoglycemia . Patient will report any questions or concerns to provider   Please see past updates related to this goal by clicking on the "Past Updates" button in the selected goal         Diabetes   Recent Relevant Labs: Lab Results  Component Value Date/Time   HGBA1C 8.6 (H) 01/06/2020 12:03 PM   HGBA1C 6.7 (H)  09/15/2019 03:33 PM   MICROALBUR 10 09/15/2019 12:20 PM   MICROALBUR 10 03/03/2019 12:52 PM    Checking BG: 2x per Day  Blood Glucose 5/7 5/8 5/9 5/10 5/11  AM 149 127 139 116 116  PM before Insulin 277 217 199 326    Patient is currently uncontrolled on the following medications:  -Toujeo 12 units in the afternoon  We discussed -BG readings are still elevated after switching to Toujeo -Collaborated with PCP regarding patient's recent BG readings after starting Toujeo. Discussed increasing Toujeo dose by 2 units daily.   Plan -Increase Toujeo to 14 units daily  Follow up: 3 days via phone  Jannette Fogo, PharmD Clinical Pharmacist [facility] [phone]

## 2020-01-22 NOTE — Chronic Care Management (AMB) (Signed)
   Chronic Care Management Pharmacy  Name: Maria [name]  MRN: [id number] DOB: 1926/06/03  Chief Complaint/ HPI  Maria [name],  86 y.o. , female presents for their Initial CCM visit with the clinical pharmacist via telephone due to COVID-19 Pandemic but asked to reschedule due to not feeling well. Initial visit rescheduled for 02/03/20. Patient reports concern over elevated blood glucose readings since starting prednisone.   PCP : Glendale Chard, MD  Their chronic conditions include: Hypertension, CHF, Diabetes, CKD, Hyperlipidemia and Gout  Current Diagnosis/Assessment:  Goals Addressed            This Visit's Progress   . Diabetes Management       CARE PLAN ENTRY  Current Barriers:  . Diabetes: Uncontrolled; complicated by chronic medical conditions including hypertension, hyperlipidemia, CKD Lab Results  Component Value Date   HGBA1C 8.6 (H) 01/06/2020 .   Lab Results  Component Value Date   CREATININE 1.60 (H) 01/06/2020   CREATININE 1.80 (H) 11/26/2019   CREATININE 1.60 (H) 09/15/2019   . Current antihyperglycemic regimen: Lantus 12 units daily . Current blood glucose readings: 176, 227, 317  Pharmacist Clinical Goal(s):  Marland Kitchen Over the next 7 days, patient will work with PharmD and primary care provider to address high blood sugar readings.  Interventions: . Changed patient from Lantus to [facility] after consulting with PCP for better all day coverage of blood sugar  Patient Self Care Activities:  . Patient will check blood glucose twice daily, document, and provide to PharmD in 7 days . Patient will administer 12 units of Toujeo daily and follow up with PharmD in 7 days . Patient will take medications as prescribed . Patient will contact provider with any episodes of hypoglycemia . Patient will report any questions or concerns to provider   Initial goal documentation        Diabetes   Recent Relevant Labs: Lab Results  Component Value Date/Time    HGBA1C 8.6 (H) 01/06/2020 12:03 PM   HGBA1C 6.7 (H) 09/15/2019 03:33 PM   MICROALBUR 10 09/15/2019 12:20 PM   MICROALBUR 10 03/03/2019 12:52 PM    Checking BG: 2x per Day  Recent FBG Readings: 176 Recent 2hr PP BG readings:  227 Recent 4pm BG readings prior to Lantus dose: 317 Patient is currently uncontrolled on the following medications:  -Lantus 12 units in the afternoon  We discussed -Patient expressed concerns regarding elevated BG readings since starting prednisone for arthritis. Arthritis symptom improvement noted per Rheumatologist -Explained that BGs are more elevated in the evening prior to next dose of Lantus, which indicates that Lantus is not controlling BG for 24 hours -Patient also reported experiencing one instance of a fasting BG below 100 recently.  -Collaborated with PCP regarding patient's recent BG readings, prednisone, and Lantus. Discussed switching to [facility] for better control control of BG for a full 24 hours.   Plan -Discontinue Lantus after afternoon dose on 5/6 -Start Toujeo 12 units daily on 5/7 with sample delivered by PharmD on 5/6  Jannette Fogo, PharmD Clinical Pharmacist [facility] [phone]

## 2020-01-22 NOTE — Patient Instructions (Signed)
Visit Information  Goals Addressed      Patient Stated   . "I don't know what food I can eat" (pt-stated)       Current Barriers:  Marland Kitchen Multiple conditions impacted by diet choices including DM II, CKD III, HTN, and Gout . Limited knowledge of specific foods okay to eat and/or avoid in relation to comorbid conditions   Social Work Clinical Goal(s):  Marland Kitchen Over the next 45 days the patient will work with CCM RN Case Manager to become more knowledgeable of diet restrictions Goal Not Met . New 01/21/20 Over the next 90 days patient will receive and review patient educational materials mailed to home regarding; Diabetes Management using Meal Planning; 6 Ways to be Water Wise; Nutrition for the Elderly 101  CCM RN CM Interventions:  01/21/20 call completed with patient  . Evaluation of current treatment plan related to dietary recommendations for DM and CHF and patient's adherence to plan as established by provider. . Reinforced education re: dietary recommendations appropriate for chronic conditions such as DM and CHF; instructed patient to eat plenty of fruits and vegetables, fresh foods including lean meats, fish, poultry, eggs (no more than 3 per week unless eating egg whites only), milk, yogurt, plain or brown rice, oatmeal, low sodium snacks and reiterated the importance of avoiding the salt shaker . Reviewed medications with patient and discussed patient is taking her Lantus exactly as directed  . Advised patient, providing education and rationale, to check cbg daily before meals and record, calling the CCM team and or Dr. Baird Cancer for findings outside established parameters FBS 80-130, <180 after meals  . Provided patient with printed educational materials related to Meal Planning Using the Plate Method and Portion Control; 6 Ways to be Water Wise; Elderly Nutrition 101 . Discussed plans with patient for ongoing care management follow up and provided patient with direct contact information for care  management team  Patient Self Care Activities:  . Attends all scheduled provider appointments . Performs ADL's independently . Calls provider office for new concerns or questions . Unable to verbalize appropriate diet restrictions in relation to health conditions  Please see past updates related to this goal by clicking on the "Past Updates" button in the selected goal      . "I have Congestive Heart Failue" (pt-stated)       Current Barriers:  Marland Kitchen Knowledge Deficits related to Self health management for CHF  Nurse Case Manager Clinical Goal(s):  Marland Kitchen Over the next 30 days, patient will work with the CCM RN to address needs related to CHF . New 06/29/19: Over the next 30 days the patient will work with CCM RN to understand the importance of daily weights Goal Met . New 01/21/20: Over the next 90 days, patient will follow Cardiology MD recommendations for treatment of palpitations and CHF AS EVIDENCE BY patient will report Cardiac status is stable with no ED visits and or IP admissions related to Impaired Cardio-Pulmonary function  CCM RN CM Interventions:  01/21/20 call completed with patient . Evaluation of current treatment plan related to CHF and patient's adherence to plan as established by provider . Encouraged patient to keep Cardiologist and or PCP well informed of new or worsening heart palpitation, chest pain and or shortness of breath . Reviewed and discussed patient f/u with Kerin Ransom PA-C on 01/08/20 with the following Assessment/Treatment plan discussed;  o Palpitations o ZIO never completed. The addition of Toprol 25 mg improved her symptoms. o Hypertensive cardiovascular  disease o LVH, DD, normal LVF on echo o Obstructive sleep apnea on CPAP o She has not been on C-pap secondary to complications o Chest pain o History of chest pain with normal coronaries Feb 2012 o Insulin dependent type 2 diabetes mellitus ([place]) o Per PCP o Arthritis- o Followed by Rheumatology- no  change with statin holiday and she has resumed Lipitor.  Her LDL was 71 on 01/06/2020. o CRI- o stage 3-4, GFR 28 on 01/06/2020- PCP is following. o Plan:  Same Rx- f/u Dr Gwenlyn Found in 6 months   . Advised patient, providing education and rationale, to weigh daily and record, calling the CCM team and MD Baird Cancer or Gwenlyn Found) for weight gain of 3lbs overnight or 5 pounds in a week .  Discussed plans with patient for ongoing care management follow up and provided patient with direct contact information for care management team  Patient Self Care Activities:  . Self administers medications as prescribed . Attends all scheduled provider appointments . Calls pharmacy for medication refills . Calls provider office for new concerns or questions  Please see past updates related to this goal by clicking on the "Past Updates" button in the selected goal      . COMPLETED: "I just feel weak" (pt-stated)       Current Barriers:  Marland Kitchen Knowledge Deficits related to acute onset of "weakness"   Nurse Case Manager Clinical Goal(s):  Marland Kitchen Over the next 14 days, patient will report the acute onset of weakness has resolved as evidence by patient will have increased energy and will be able to complete her daily activities w/o difficulty or fatigue. GOAL MET . Over the next 30 days, patient will not experience ED visits and or IP admission.   CCM RN CM Interventions:  01/21/20 call completed with the patient  . Evaluation of current treatment plan related to new onset of weakness and patient's adherence to plan as established by provider . Determined patient feels stronger, she has "good days and bad days" but denies having new or worsening symptoms of fatigue . Instructed patient to continue to drink plenty of water in order to stay well hydrated, promote kidney function and increase energy level . Encouraged patient to balance activity with rest and to use the best time of the day that she feels most energetic   . Discussed plans with patient for ongoing care management follow up and provided patient with direct contact information for care management team  Patient Self Care Activities:  . Self administers medications as prescribed . Attends all scheduled provider appointments . Calls pharmacy for medication refills . Performs ADL's independently . Performs IADL's independently . Calls provider office for new concerns or questions   Please see past updates related to this goal by clicking on the "Past Updates" button in the selected goal      . COMPLETED: I would like to manage my chronic conditions (pt-stated)       A. DM  i. Patient states she has concerns regarding checking her BG, specifically about obtaining enough blood and feeling that BG lancet injections are painful. She inquires about possibility of using [employer].  ii. BG readings: 90s; only goes above 100 when she "overindulges"  iii. Hypoglycemia: No BG <70; no s/sx of hypoglycemia  iv. Diet: 2 meals (breakfast, dinner) 2 snacks (lunch, after dinner)   1. Breakfast: cereal, sausage or [place] bacon, banana  2. Dinner: greens, turkey/chicken  3. Snacks: graham crackers, chicken, potato salad, macaroni,  peanut butter/jelly sandwich  4. Drinks: cherry juice, coffee, hot chocolate, water, milk  5. Exercise: numbness in left leg, tries to walk up the hallway at [facility], uses roller chair, not walking frequently b/c she almost passed out   vi. HbA1c (06/03/19): 6.9  vii. EGFR (06/11/19): 33  viii. ASCVD risk: cannot calculate (age)  ix. Current meds: insulin glargine (Lantus) 12 units QHS   1. Affordability: Pt receives all meds through [address] for a $0 copay except for allopurinol and colchicine which she receives through [employer]  x. Prior meds: N/A  xi. ACEi/ARB, ASA81, statin? yes, yes, yes  xii. Foot exam: N/A  xiii. Eye exam: retinopathy (01/2019)  xiv. HBA1c goal <8% (FPG 90-150; Bedtime 100-180)   xv. Plan:   1. Continue insulin glargine (Lantus) 12 units QHS  2.Informed patient that Freestyle Elenor Legato is not covered via insurance and the cash price ~$130 for transmitter and $90 for 30 day supply of sensor. Patient would prefer to check BG manually with BG meter, BG test strips, and lancing device at this time. Plan for patient to check fasting blood glucose every other day or every 2-3 days considering patient's current pain level with checking blood glucose and controlled HbA1c.   B. Gout  - Patient states she is currently taking allopurinol 300 mg daily and colchicine 0.6 mg sporadically (rather than onset of gout flare per instructions at last rheumatology appt) "to prevent gout attacks". Her uric acid level on 06/11/2019 was 5.5 and she has not had a recent gout attack.  -CrCl 20 mL/min -eGFR 33 (06/11/2019) Plan: 1. Recommend consideration of dose reduction of allopurinol to 200 mg daily 2. Recommend consideration of educating patient to take colchicine 0.6 at onset of gout flare (can take an additional 0.6 mg (total = 1.2 mg) if she does not find initial 0.6 mg beneficial) and to not repeat within 2 weeks.  C. Pain -Patient has been seeing occupational therapy. During her management, she has used Voltaren gel 1% to manage pain on her hands and finds this beneficial. She was wondering if possible she could obtain a prescription for further use of Voltaren gel 1%. Plan: 1. Defer voltaren gel 1% prescription to expertise of rheumatology   Initial goal documentation       Patient verbalizes understanding of instructions provided today.   Telephone follow up appointment with care management team member scheduled for: 02/25/20  Barb Merino, RN, BSN, CCM Care Management Coordinator [place] [facility]  Direct Phone: [phone]

## 2020-01-22 NOTE — Chronic Care Management (AMB) (Signed)
Chronic Care Management   Follow Up Note   01/21/2020 Name: Maria [name] MRN: [id number] DOB: July 21, 1926  Referred by: Glendale Chard, MD Reason for referral : Chronic Care Management (FU Call - DM, CHF, CKD, RA )   Maria [name] is a 86 y.o. year old female who is a primary care patient of Glendale Chard, MD. The CCM team was consulted for assistance with chronic disease management and care coordination needs.    Review of patient status, including review of consultants reports, relevant laboratory and other test results, and collaboration with appropriate care team members and the patient's provider was performed as part of comprehensive patient evaluation and provision of chronic care management services.    SDOH (Social Determinants of Health) assessments performed: Yes - No Acute Challenges Identified at this time See Care Plan activities for detailed interventions related to [place])   Placed outbound CCM RN CM follow up call to patient for a CCM update.     Outpatient Encounter Medications as of 01/20/2020  Medication Sig  . Accu-Chek FastClix Lancets MISC CHECK BLOOD SUGAR BEFORE BREAKFAST AND DINNER  . ACCU-CHEK SMARTVIEW test strip TEST TWICE DAILY BEFORE BREAKFAST AND DINNER  . allopurinol (ZYLOPRIM) 100 MG tablet Take 2 tablets (200 mg total) by mouth daily.  Marland Kitchen amLODipine (NORVASC) 5 MG tablet   . aspirin 81 MG tablet Take 1 tablet (81 mg total) by mouth daily.  Marland Kitchen atorvastatin (LIPITOR) 40 MG tablet TAKE 1 TABLET DAILY  . Cholecalciferol (VITAMIN D) 2000 UNITS tablet Take 2,000 Units by mouth daily.  . citalopram (CELEXA) 20 MG tablet Take 20 mg by mouth daily.  . diclofenac Sodium (VOLTAREN) 1 % GEL Apply 2 g topically 4 (four) times daily.  Marland Kitchen docusate sodium (COLACE) 100 MG capsule Take 100 mg by mouth daily.  Marland Kitchen donepezil (ARICEPT) 10 MG tablet TAKE 1 TABLET DAILY IN THE EVENING  . ezetimibe (ZETIA) 10 MG tablet TAKE 1 TABLET AT BEDTIME  . FOLBIC 2.5-25-2 MG TABS  tablet TAKE 1 TABLET DAILY  . furosemide (LASIX) 40 MG tablet TAKE 1 TABLET TWICE A DAY (NEED TO SCHEDULE AN APPOINTMENT)  . hydrALAZINE (APRESOLINE) 25 MG tablet TAKE 1 TABLET THREE TIMES A DAY  . hydroxychloroquine (PLAQUENIL) 200 MG tablet Take 1 tablet (200 mg total) by mouth daily.  . insulin glargine, 1 Unit Dial, (TOUJEO SOLOSTAR) 300 UNIT/ML Solostar Pen Inject 17 Units into the skin at bedtime.  . Insulin Pen Needle (BD PEN NEEDLE MICRO U/F) 32G X 6 MM MISC Use as directed  . isosorbide mononitrate (IMDUR) 30 MG 24 hr tablet TAKE 1 TABLET DAILY  . loratadine (CLARITIN) 10 MG tablet Take 10 mg by mouth every morning.  . Magnesium 250 MG TABS Take by mouth at bedtime.  . metolazone (ZAROXOLYN) 2.5 MG tablet TAKE 1 TABLET EVERY OTHER DAY 30 MINUTES PRIOR TO YOUR MORNING LASIX DOSE  . metoprolol succinate (TOPROL-XL) 25 MG 24 hr tablet TAKE 1 TABLET(25 MG) BY MOUTH DAILY  . potassium chloride (KLOR-CON) 10 MEQ tablet   . predniSONE (DELTASONE) 2.5 MG tablet Take 3 tablets (7.5 mg total) by mouth daily with breakfast.  . telmisartan (MICARDIS) 80 MG tablet TAKE 1 TABLET DAILY   No facility-administered encounter medications on file as of 01/20/2020.     Objective:  Lab Results  Component Value Date   HGBA1C 8.6 (H) 01/06/2020   HGBA1C 6.7 (H) 09/15/2019   HGBA1C 6.9 (H) 06/03/2019   Lab Results  Component Value  Date   MICROALBUR 10 09/15/2019   LDLCALC 71 01/06/2020   CREATININE 1.60 (H) 01/06/2020   BP Readings from Last 3 Encounters:  01/08/20 132/79  01/06/20 138/64  01/06/20 138/64    Goals Addressed      Patient Stated   . "I don't know what food I can eat" (pt-stated)       Current Barriers:  Marland Kitchen Multiple conditions impacted by diet choices including DM II, CKD III, HTN, and Gout . Limited knowledge of specific foods okay to eat and/or avoid in relation to comorbid conditions   Social Work Clinical Goal(s):  Marland Kitchen Over the next 45 days the patient will work with CCM  RN Case Manager to become more knowledgeable of diet restrictions Goal Not Met . New 01/21/20 Over the next 90 days patient will receive and review patient educational materials mailed to home regarding; Diabetes Management using Meal Planning; 6 Ways to be Water Wise; Nutrition for the Elderly 101  CCM RN CM Interventions:  01/21/20 call completed with patient  . Evaluation of current treatment plan related to dietary recommendations for DM and CHF and patient's adherence to plan as established by provider. . Reinforced education re: dietary recommendations appropriate for chronic conditions such as DM and CHF; instructed patient to eat plenty of fruits and vegetables, fresh foods including lean meats, fish, poultry, eggs (no more than 3 per week unless eating egg whites only), milk, yogurt, plain or brown rice, oatmeal, low sodium snacks and reiterated the importance of avoiding the salt shaker . Reviewed medications with patient and discussed patient is taking her Lantus exactly as directed  . Advised patient, providing education and rationale, to check cbg daily before meals and record, calling the CCM team and or Dr. Baird Cancer for findings outside established parameters FBS 80-130, <180 after meals  . Provided patient with printed educational materials related to Meal Planning Using the Plate Method and Portion Control; 6 Ways to be Water Wise; Elderly Nutrition 101 . Discussed plans with patient for ongoing care management follow up and provided patient with direct contact information for care management team  Patient Self Care Activities:  . Attends all scheduled provider appointments . Performs ADL's independently . Calls provider office for new concerns or questions . Unable to verbalize appropriate diet restrictions in relation to health conditions  Please see past updates related to this goal by clicking on the "Past Updates" button in the selected goal     . "I have Congestive Heart  Failue" (pt-stated)       Current Barriers:  Marland Kitchen Knowledge Deficits related to Self health management for CHF  Nurse Case Manager Clinical Goal(s):  Marland Kitchen Over the next 30 days, patient will work with the CCM RN to address needs related to CHF . New 06/29/19: Over the next 30 days the patient will work with CCM RN to understand the importance of daily weights Goal Met . New 01/21/20: Over the next 90 days, patient will follow Cardiology MD recommendations for treatment of palpitations and CHF AS EVIDENCE BY patient will report Cardiac status is stable with no ED visits and or IP admissions related to Impaired Cardio-Pulmonary function  CCM RN CM Interventions:  01/21/20 call completed with patient . Evaluation of current treatment plan related to CHF and patient's adherence to plan as established by provider . Encouraged patient to keep Cardiologist and or PCP well informed of new or worsening heart palpitation, chest pain and or shortness of breath . Reviewed and  discussed patient f/u with Kerin Ransom PA-C on 01/08/20 with the following Assessment/Treatment plan discussed;  o Palpitations o ZIO never completed. The addition of Toprol 25 mg improved her symptoms. o Hypertensive cardiovascular disease o LVH, DD, normal LVF on echo o Obstructive sleep apnea on CPAP o She has not been on C-pap secondary to complications o Chest pain o History of chest pain with normal coronaries Feb 2012 o Insulin dependent type 2 diabetes mellitus ([place]) o Per PCP o Arthritis- o Followed by Rheumatology- no change with statin holiday and she has resumed Lipitor.  Her LDL was 71 on 01/06/2020. o CRI- o stage 3-4, GFR 28 on 01/06/2020- PCP is following. o Plan:  Same Rx- f/u Dr Gwenlyn Found in 6 months   . Advised patient, providing education and rationale, to weigh daily and record, calling the CCM team and MD Baird Cancer or Gwenlyn Found) for weight gain of 3lbs overnight or 5 pounds in a week .  Discussed plans with patient for  ongoing care management follow up and provided patient with direct contact information for care management team  Patient Self Care Activities:  . Self administers medications as prescribed . Attends all scheduled provider appointments . Calls pharmacy for medication refills . Calls provider office for new concerns or questions  Please see past updates related to this goal by clicking on the "Past Updates" button in the selected goal     . COMPLETED: "I just feel weak" (pt-stated)       Current Barriers:  Marland Kitchen Knowledge Deficits related to acute onset of "weakness"   Nurse Case Manager Clinical Goal(s):  Marland Kitchen Over the next 14 days, patient will report the acute onset of weakness has resolved as evidence by patient will have increased energy and will be able to complete her daily activities w/o difficulty or fatigue. GOAL MET . Over the next 30 days, patient will not experience ED visits and or IP admission.   CCM RN CM Interventions:  01/21/20 call completed with the patient  . Evaluation of current treatment plan related to new onset of weakness and patient's adherence to plan as established by provider . Determined patient feels stronger, she has "good days and bad days" but denies having new or worsening symptoms of fatigue . Instructed patient to continue to drink plenty of water in order to stay well hydrated, promote kidney function and increase energy level . Encouraged patient to balance activity with rest and to use the best time of the day that she feels most energetic  . Discussed plans with patient for ongoing care management follow up and provided patient with direct contact information for care management team  Patient Self Care Activities:  . Self administers medications as prescribed . Attends all scheduled provider appointments . Calls pharmacy for medication refills . Performs ADL's independently . Performs IADL's independently . Calls provider office for new concerns or  questions  Please see past updates related to this goal by clicking on the "Past Updates" button in the selected goal     . COMPLETED: I would like to manage my chronic conditions (pt-stated)       A. DM  i. Patient states she has concerns regarding checking her BG, specifically about obtaining enough blood and feeling that BG lancet injections are painful. She inquires about possibility of using [employer].  ii. BG readings: 90s; only goes above 100 when she "overindulges"  iii. Hypoglycemia: No BG <70; no s/sx of hypoglycemia  iv. Diet: 2 meals (  breakfast, dinner) 2 snacks (lunch, after dinner)   1. Breakfast: cereal, sausage or [place] bacon, banana  2. Dinner: greens, turkey/chicken  3. Snacks: graham crackers, chicken, potato salad, macaroni, peanut butter/jelly sandwich  4. Drinks: cherry juice, coffee, hot chocolate, water, milk  5. Exercise: numbness in left leg, tries to walk up the hallway at [facility], uses roller chair, not walking frequently b/c she almost passed out   vi. HbA1c (06/03/19): 6.9  vii. EGFR (06/11/19): 33  viii. ASCVD risk: cannot calculate (age)  ix. Current meds: insulin glargine (Lantus) 12 units QHS   1. Affordability: Pt receives all meds through [facility] for a $0 copay except for allopurinol and colchicine which she receives through [employer]  x. Prior meds: N/A  xi. ACEi/ARB, ASA81, statin? yes, yes, yes  xii. Foot exam: N/A  xiii. Eye exam: retinopathy (01/2019)  xiv. HBA1c goal <8% (FPG 90-150; Bedtime 100-180)   xv. Plan:  1. Continue insulin glargine (Lantus) 12 units QHS  2.Informed patient that Freestyle Elenor Legato is not covered via insurance and the cash price ~$130 for transmitter and $90 for 30 day supply of sensor. Patient would prefer to check BG manually with BG meter, BG test strips, and lancing device at this time. Plan for patient to check fasting blood glucose every other day or every 2-3 days considering patient's current pain level  with checking blood glucose and controlled HbA1c.  B. Gout  - Patient states she is currently taking allopurinol 300 mg daily and colchicine 0.6 mg sporadically (rather than onset of gout flare per instructions at last rheumatology appt) "to prevent gout attacks". Her uric acid level on 06/11/2019 was 5.5 and she has not had a recent gout attack.  -CrCl 20 mL/min -eGFR 33 (06/11/2019) Plan: 1. Recommend consideration of dose reduction of allopurinol to 200 mg daily 2. Recommend consideration of educating patient to take colchicine 0.6 at onset of gout flare (can take an additional 0.6 mg (total = 1.2 mg) if she does not find initial 0.6 mg beneficial) and to not repeat within 2 weeks.  C. Pain -Patient has been seeing occupational therapy. During her management, she has used Voltaren gel 1% to manage pain on her hands and finds this beneficial. She was wondering if possible she could obtain a prescription for further use of Voltaren gel 1%. Plan: 1. Defer voltaren gel 1% prescription to expertise of rheumatology   Initial goal documentation       Plan:   Telephone follow up appointment with care management team member scheduled for: 02/25/20  Barb Merino, RN, BSN, CCM Care Management Coordinator [facility]  Direct Phone: [phone]

## 2020-01-25 ENCOUNTER — Encounter: Payer: Self-pay | Admitting: Physician Assistant

## 2020-01-25 ENCOUNTER — Other Ambulatory Visit: Payer: Self-pay

## 2020-01-25 ENCOUNTER — Encounter (INDEPENDENT_AMBULATORY_CARE_PROVIDER_SITE_OTHER): Payer: Medicare Other | Admitting: Ophthalmology

## 2020-01-25 ENCOUNTER — Other Ambulatory Visit: Payer: Self-pay | Admitting: Cardiovascular Disease

## 2020-01-25 DIAGNOSIS — E11319 Type 2 diabetes mellitus with unspecified diabetic retinopathy without macular edema: Secondary | ICD-10-CM | POA: Diagnosis not present

## 2020-01-25 DIAGNOSIS — M069 Rheumatoid arthritis, unspecified: Secondary | ICD-10-CM

## 2020-01-25 DIAGNOSIS — E113293 Type 2 diabetes mellitus with mild nonproliferative diabetic retinopathy without macular edema, bilateral: Secondary | ICD-10-CM | POA: Diagnosis not present

## 2020-01-25 LAB — HM DIABETES EYE EXAM

## 2020-01-27 ENCOUNTER — Other Ambulatory Visit: Payer: Self-pay

## 2020-01-27 ENCOUNTER — Encounter: Payer: Self-pay | Admitting: Physician Assistant

## 2020-01-27 ENCOUNTER — Ambulatory Visit: Payer: Medicare Other | Admitting: Physician Assistant

## 2020-01-27 ENCOUNTER — Telehealth: Payer: Self-pay

## 2020-01-27 VITALS — BP 114/60 | HR 69 | Resp 18 | Ht 59.0 in | Wt 161.0 lb

## 2020-01-27 DIAGNOSIS — M170 Bilateral primary osteoarthritis of knee: Secondary | ICD-10-CM

## 2020-01-27 DIAGNOSIS — Z79899 Other long term (current) drug therapy: Secondary | ICD-10-CM

## 2020-01-27 DIAGNOSIS — M19041 Primary osteoarthritis, right hand: Secondary | ICD-10-CM

## 2020-01-27 DIAGNOSIS — M65322 Trigger finger, left index finger: Secondary | ICD-10-CM | POA: Diagnosis not present

## 2020-01-27 DIAGNOSIS — I5032 Chronic diastolic (congestive) heart failure: Secondary | ICD-10-CM

## 2020-01-27 DIAGNOSIS — M19042 Primary osteoarthritis, left hand: Secondary | ICD-10-CM

## 2020-01-27 DIAGNOSIS — M0600 Rheumatoid arthritis without rheumatoid factor, unspecified site: Secondary | ICD-10-CM

## 2020-01-27 DIAGNOSIS — M19072 Primary osteoarthritis, left ankle and foot: Secondary | ICD-10-CM

## 2020-01-27 DIAGNOSIS — Z8673 Personal history of transient ischemic attack (TIA), and cerebral infarction without residual deficits: Secondary | ICD-10-CM

## 2020-01-27 DIAGNOSIS — M1A09X0 Idiopathic chronic gout, multiple sites, without tophus (tophi): Secondary | ICD-10-CM

## 2020-01-27 DIAGNOSIS — I110 Hypertensive heart disease with heart failure: Secondary | ICD-10-CM

## 2020-01-27 DIAGNOSIS — Z9989 Dependence on other enabling machines and devices: Secondary | ICD-10-CM

## 2020-01-27 DIAGNOSIS — N189 Chronic kidney disease, unspecified: Secondary | ICD-10-CM

## 2020-01-27 DIAGNOSIS — Z8639 Personal history of other endocrine, nutritional and metabolic disease: Secondary | ICD-10-CM

## 2020-01-27 DIAGNOSIS — G4733 Obstructive sleep apnea (adult) (pediatric): Secondary | ICD-10-CM

## 2020-01-27 DIAGNOSIS — Z8719 Personal history of other diseases of the digestive system: Secondary | ICD-10-CM

## 2020-01-27 DIAGNOSIS — N289 Disorder of kidney and ureter, unspecified: Secondary | ICD-10-CM

## 2020-01-27 DIAGNOSIS — M19071 Primary osteoarthritis, right ankle and foot: Secondary | ICD-10-CM

## 2020-01-27 NOTE — Telephone Encounter (Signed)
Please apply for right knee visco, per Hazel Sams, PA-C. Patient requests synvisc one. Thanks!

## 2020-01-27 NOTE — Telephone Encounter (Signed)
Submitted for VOB 01/27/2020.

## 2020-01-27 NOTE — Patient Instructions (Addendum)
Take prednisone 2.5 mg 2 tablets (5 mg total) by mouth daily for 1 month.  Then reduce by 1 mg every month.  Please call us when you run out of your current prednisone prescription and we will send in a refill for 1 mg tablets to take 4 tablets for 1 month, 3 tablets for 1 month, 2 tablets for 1 month, then 1 tablet for 1 month.

## 2020-01-28 LAB — COMPLETE METABOLIC PANEL WITH GFR
AG Ratio: 1.5 (calc) (ref 1.0–2.5)
ALT: 19 U/L (ref 6–29)
AST: 18 U/L (ref 10–35)
Albumin: 3.5 g/dL — ABNORMAL LOW (ref 3.6–5.1)
Alkaline phosphatase (APISO): 60 U/L (ref 37–153)
BUN/Creatinine Ratio: 28 (calc) — ABNORMAL HIGH (ref 6–22)
BUN: 43 mg/dL — ABNORMAL HIGH (ref 7–25)
CO2: 32 mmol/L (ref 20–32)
Calcium: 8.7 mg/dL (ref 8.6–10.4)
Chloride: 100 mmol/L (ref 98–110)
Creat: 1.52 mg/dL — ABNORMAL HIGH (ref 0.60–0.88)
GFR, Est African American: 34 mL/min/{1.73_m2} — ABNORMAL LOW (ref >=60)
GFR, Est Non African American: 30 mL/min/{1.73_m2} — ABNORMAL LOW (ref >=60)
Globulin: 2.3 g/dL (calc) (ref 1.9–3.7)
Glucose, Bld: 306 mg/dL — ABNORMAL HIGH (ref 65–99)
Potassium: 4.3 mmol/L (ref 3.5–5.3)
Sodium: 140 mmol/L (ref 135–146)
Total Bilirubin: 0.4 mg/dL (ref 0.2–1.2)
Total Protein: 5.8 g/dL — ABNORMAL LOW (ref 6.1–8.1)

## 2020-01-28 LAB — CBC WITH DIFFERENTIAL/PLATELET
Absolute Monocytes: 357 cells/uL (ref 200–950)
Basophils Absolute: 21 cells/uL (ref 0–200)
Basophils Relative: 0.3 %
Eosinophils Absolute: 49 cells/uL (ref 15–500)
Eosinophils Relative: 0.7 %
HCT: 29.7 % — ABNORMAL LOW (ref 35.0–45.0)
Hemoglobin: 9.1 g/dL — ABNORMAL LOW (ref 11.7–15.5)
Lymphs Abs: 1001 cells/uL (ref 850–3900)
MCH: 27.6 pg (ref 27.0–33.0)
MCHC: 30.6 g/dL — ABNORMAL LOW (ref 32.0–36.0)
MCV: 90 fL (ref 80.0–100.0)
MPV: 12.7 fL — ABNORMAL HIGH (ref 7.5–12.5)
Monocytes Relative: 5.1 %
Neutro Abs: 5572 cells/uL (ref 1500–7800)
Neutrophils Relative %: 79.6 %
Platelets: 198 10*3/uL (ref 140–400)
RBC: 3.3 10*6/uL — ABNORMAL LOW (ref 3.80–5.10)
RDW: 16.2 % — ABNORMAL HIGH (ref 11.0–15.0)
Total Lymphocyte: 14.3 %
WBC: 7 10*3/uL (ref 3.8–10.8)

## 2020-01-28 LAB — URIC ACID: Uric Acid, Serum: 4.9 mg/dL (ref 2.5–7.0)

## 2020-01-28 NOTE — Progress Notes (Signed)
Creatinine is elevated and GFR is low but improving. She was referred to nephrology by Dr. Baird Cancer. RBC count, Hgb, and Hct are low but stable.  Anemia likely due to chronic kidney disease.   Uric acid is WNL.

## 2020-02-03 ENCOUNTER — Other Ambulatory Visit: Payer: Self-pay

## 2020-02-03 ENCOUNTER — Ambulatory Visit: Payer: Medicare Other

## 2020-02-03 DIAGNOSIS — E1122 Type 2 diabetes mellitus with diabetic chronic kidney disease: Secondary | ICD-10-CM

## 2020-02-03 DIAGNOSIS — E782 Mixed hyperlipidemia: Secondary | ICD-10-CM

## 2020-02-03 DIAGNOSIS — N183 Chronic kidney disease, stage 3 unspecified: Secondary | ICD-10-CM

## 2020-02-03 DIAGNOSIS — Z794 Long term (current) use of insulin: Secondary | ICD-10-CM

## 2020-02-03 DIAGNOSIS — I5032 Chronic diastolic (congestive) heart failure: Secondary | ICD-10-CM

## 2020-02-03 NOTE — Chronic Care Management (AMB) (Signed)
Chronic Care Management Pharmacy  Name: Maria [name]  MRN: [id number] DOB: Aug 24, 1926  Chief Complaint/ HPI  Maria [name],  86 y.o. , female presents for their Initial CCM visit with the clinical pharmacist via telephone due to COVID-19 Pandemic and later in home visit to review medications.  PCP : Maria Chard, MD  Their chronic conditions include: Diabetes, Hypertension, Hyperlipidemia, Congestive heart failure, CKD, Gout, Seronegative rheumatoid arthritis   Office Visits: 01/06/20 AWV and OV: Diabetes and hypertension follow up. Labs ordered (Phosphorous, PTH intact w/ Ca, BMP8+EGFR, HgbA1c, Lipid panel). Home health referral for physical therapy. Encouraged to avoid salt. May benefit from intra-articular steroid injection for osteoarthritis. Encourage to decrease BMI to 29.   09/15/19 OV: Presented for diabetes and hypertension follow up. Nephrology referral discussed but pt declined at present. Labs ordered (UA, CMP14+EGFR, CBC no Diff, Phosphorous, Protein electrophoresis, HgbA1c). Keflex started every 12 hours for boil of buttock.   Consult Visit: 01/27/20 Rheumatology OV w/ Maria [name]: Creatinine elevated and GFR low, but improving. Referred to Nephrology by PCP. RBC/Hgb/Hct low, but stable. Anemia likely due to CKD. Uric acid within normal limits. No longer on allopurinol or colchicine due to kidney function. Pt tolerating Plaquenil 243m well without side effects. Significant improvement in pain and symptoms on Plaquenil and prednisone (current dose Prednisone 7.531mdaily). Pt asked when she could begin tapering prednisone dose, she has had to increase insulin dose due to long term prednisone use. Current complaint includes right knee joint pain beginning 4 days ago. Has previously been treated with cortisone injections and Visco gel injections. Reported taking Aleve and Voltaren gel sparingly for pain. Start tapering prednisone dose to 21m63maily for 1 month then decrease by 1mg66mvery month. Continue Plaquenil. Plan to administer Synvisc injection for knee pain once approved. Recommended pt avoid Aleve.   01/15/20 Podiatry OV w/ Dr. MayePrudence Davidsonesented for at risk foot care due to DM. Toenails are painful when walking and wearing shoes and she is unable to cut herself due to mobility. Debridement performed. Follow up in 10 weeks.   01/08/20 Cardiology Televisit w/ Dr. KilrRosalyn Gessllow up from 3/25 visit. Patient had problem w/ palpitations in Sept 2020 and was put on Toprol which improved symptoms. ZIO monitor was ordered but never used. No difference was perceived in pain with statin holiday so patient has resumed atorvastatin. In person follow up with Dr. BerrGwenlyn Found6 months.   12/21/19 Ophthalmology OV w/ Dr. GiegSusa Simmondsllow up s/p EDTA chelation OD 06/22/19, OS 02/23/19. Pt reported tearing in both eyes. Use artificial tears as needed.   12/04/19- Per rheumatology, due DEXA scan (refer to PCP), will taper prednisone when arthritis is better controlled  12/03/19 Cardiology Televisit w/ Dr. KilrRosalyn GessO Maria Reachults still not available. Recommended holding Lipitor for 4 weeks for arthritis. Follow up in 4 weeks.  12/01/19- Colchicine d/c after reviewing labs on 3/18, reduced allopurinol to 200mg20mly due to kidney function  11/26/19 Rheumatology OV: (Seronegative RA) Tolerated Plaquenil well with no side effects, has noticed improvement on prednisone and Plaquenil. Consider adding Arava at next follow up in 2 months  11/04/19 Podiatry OV: Debridement and preventative foot care, recommended soaks and soft shoes, follow up in 10 weeks  10/26/19 Rheumatology phone visit: Prednisone increased to 7.21mg d71my  10/15/19 Rheumatology OV: Diagnosed with seronegative rheumatoid arthritis from lab work at previous OV. PaTivolient on prednisone 21mg da10m, elevating BG, started on Plaquenil 200mg 1 18met daily.  10/05/19 Rheumatology OV:  Presented for evaluation of pain and swelling in both hands.  Physical therapy has not improved symptoms. Recheck uric acid levels. Continue allopurinol and colchicine daily. Start prednisone 28m daily. Follow up in 1 month. Labs ordered. Advised to monitor BG while on prednisone.   08/21/19 Podiatry OV: Presented for preventative foot care services. Debridement performed. Follow up in 10 weeks.   CCM Encounters: 01/20/20 RN: Current treatment plans for chronic conditions evaluated. Pt reported still taking allopurinol 3077mdaily and colchicine 0.58m24mporadically for gout.   08/13/19 SW: Reviewed progression of patient stated goals, including heart failure and weakness, and care coordination.  08/12/19 PharmD: Reviewed affordability of medications. Recommend dose reduction to allopurinol 200m54mily and education on appropriate colchicine dosing.  Medications: Outpatient Encounter Medications as of 02/03/2020  Medication Sig  . furosemide (LASIX) 40 MG tablet TAKE 1 TABLET TWICE A DAY (NEED TO SCHEDULE AN APPOINTMENT)  . hydrALAZINE (APRESOLINE) 25 MG tablet TAKE 1 TABLET THREE TIMES A DAY  . isosorbide mononitrate (IMDUR) 30 MG 24 hr tablet TAKE 1 TABLET DAILY  . metoprolol succinate (TOPROL-XL) 25 MG 24 hr tablet TAKE 1 TABLET(25 MG) BY MOUTH DAILY  . [DISCONTINUED] insulin glargine, 1 Unit Dial, (TOUJEO SOLOSTAR) 300 UNIT/ML Solostar Pen Inject 17 Units into the skin at bedtime.  . [DISCONTINUED] metolazone (ZAROXOLYN) 2.5 MG tablet TAKE 1 TABLET EVERY OTHER DAY 30 MINUTES PRIOR TO YOUR MORNING LASIX DOSE  . ACCU-CHEK SMARTVIEW test strip TEST TWICE DAILY BEFORE BREAKFAST AND DINNER  . allopurinol (ZYLOPRIM) 100 MG tablet Take 2 tablets (200 mg total) by mouth daily. (Patient not taking: Reported on 03/07/2020)  . amLODipine (NORVASC) 5 MG tablet   . aspirin 81 MG tablet Take 1 tablet (81 mg total) by mouth daily.  . Cholecalciferol (VITAMIN D) 2000 UNITS tablet Take 2,000 Units by mouth daily.  . citalopram (CELEXA) 20 MG tablet Take 20 mg by mouth  daily. (Patient not taking: Reported on 03/09/2020)  . diclofenac Sodium (VOLTAREN) 1 % GEL Apply 2 g topically 4 (four) times daily.  . doMarland Kitchenusate sodium (COLACE) 100 MG capsule Take 100 mg by mouth daily.  . doMarland Kitchenepezil (ARICEPT) 10 MG tablet TAKE 1 TABLET DAILY IN THE EVENING  . ezetimibe (ZETIA) 10 MG tablet TAKE 1 TABLET AT BEDTIME  . FOLBIC 2.5-25-2 MG TABS tablet TAKE 1 TABLET DAILY  . hydroxychloroquine (PLAQUENIL) 200 MG tablet Take 1 tablet (200 mg total) by mouth daily.  . Insulin Pen Needle (BD PEN NEEDLE MICRO U/F) 32G X 6 MM MISC Use as directed  . loratadine (CLARITIN) 10 MG tablet Take 10 mg by mouth every morning.  . Magnesium 250 MG TABS Take by mouth at bedtime.  . potassium chloride (KLOR-CON) 10 MEQ tablet Take 10 mEq by mouth 2 (two) times daily.   . predniSONE (DELTASONE) 2.5 MG tablet Take 3 tablets (7.5 mg total) by mouth daily with breakfast.  . telmisartan (MICARDIS) 80 MG tablet TAKE 1 TABLET DAILY  . [DISCONTINUED] Accu-Chek FastClix Lancets MISC CHECK BLOOD SUGAR BEFORE BREAKFAST AND DINNER  . [DISCONTINUED] atorvastatin (LIPITOR) 40 MG tablet TAKE 1 TABLET DAILY  . [DISCONTINUED] clonazePAM (KLONOPIN) 0.5 MG tablet Take 0.5 mg by mouth at bedtime.   No facility-administered encounter medications on file as of 02/03/2020.     Current Diagnosis/Assessment:  SDOH Interventions     Most Recent Value  SDOH Interventions  Transportation Interventions Intervention Not Indicated  [Home visit and delivery of medication sample to patient since she does not  drive]      Goals Addressed            This Visit's Progress   . Pharmacy Care Plan       CARE PLAN ENTRY (see longitudinal plan of care for additional care plan information)  Current Barriers:  . Chronic Disease Management support, education, and care coordination needs related to Hypertension, Hyperlipidemia, Diabetes, and Heart Failure   Hypertension BP Readings from Last 3 Encounters:  01/27/20 114/60   01/08/20 132/79  01/06/20 138/64   . Pharmacist Clinical Goal(s): o Over the next 90 days, patient will work with PharmD and providers to maintain BP goal <130/80 . Current regimen:  o Amlodipine 61m daily o Telmisartan 841mdaily . Interventions: o Dietary recommendations provided (limit salt intake) . Patient self care activities - Over the next 90 days, patient will: o Check BP 1-2 times weekly and if symptomatic, document, and provide at future appointments o Ensure daily salt intake < 2300 mg/day  Hyperlipidemia Lab Results  Component Value Date/Time   LDLCALC 71 01/06/2020 12:03 PM   . Pharmacist Clinical Goal(s): o Over the next 90 days, patient will work with PharmD and providers to maintain LDL goal < 70 . Current regimen:  o Atorvastatin 4015mhree times weekly o Ezetimibe 80m80mily . Interventions: o Provided dietary and exercise recommendations . Patient self care activities - Over the next 90 days, patient will: o Take cholesterol medication as directed o Exercise as able  Diabetes Lab Results  Component Value Date/Time   HGBA1C 8.6 (H) 01/06/2020 12:03 PM   HGBA1C 6.7 (H) 09/15/2019 03:33 PM   . Pharmacist Clinical Goal(s): o Over the next 90 days, patient will work with PharmD and providers to achieve A1c goal <7% . Current regimen:  o Toujeo 19 units nightly . Interventions: o After collaboration with PCP, patient is going to start Ozempic 0.25mg26me weekly o Took sample pen to patient's home on 5/26 o Counseled patient proper injection technique, once weekly dosing, and possible side effects of Ozempic . Patient self care activities - Over the next 90 days, patient will: o Check blood sugar twice daily, document, and provide at future appointments o Contact provider with any episodes of hypoglycemia o Start Ozempic 0.25mg 34m weekly  Heart Failure . Pharmacist Clinical Goal(s) o Over the next 90 days, patient will work with PharmD and providers  to reduce symptoms associated with heart failure . Current regimen:   Furosemide 40mg t53m daily   Hydralazine 25mg th1mtimes daily  Isosorbide mononitrate ER 30mg dai1mMetolazone 2.5mg every69mher day prior to morning Lasix dose  Metoprolol Succinate XL 25mg daily2mtassium 80mEq twice47mly . Interventions: o Encouraged patient to exercise as able . Patient self care activities - Over the next 90 days, patient will: o Weigh daily and record to check for fluid buildup o Notify PCP of significant shortness of breath o  Medication management . Pharmacist Clinical Goal(s): o Over the next 90 days, patient will work with PharmD and providers to achieve optimal medication adherence . [facility] o Comprehensive medication review performed. o Continue current medication management strategy . Patient self care activities - Over the next 90 days, patient will: o Focus on medication adherence by continued use of pillbox and calendar reminders o Take medications as prescribed o Report any questions or concerns to PharmD and/or provider(s)  Initial goal documentation  Diabetes   Recent Relevant Labs: Lab Results  Component Value Date/Time   HGBA1C 8.1 (H) 03/07/2020 11:01 AM   HGBA1C 8.6 (H) 01/06/2020 12:03 PM   MICROALBUR 10 09/15/2019 12:20 PM   MICROALBUR 10 03/03/2019 12:52 PM    Kidney Function Lab Results  Component Value Date/Time   CREATININE 1.52 (H) 01/27/2020 11:07 AM   CREATININE 1.60 (H) 01/06/2020 12:03 PM   CREATININE 1.80 (H) 11/26/2019 11:44 AM   GFRNONAA 30 (L) 01/27/2020 11:07 AM   GFRAA 34 (L) 01/27/2020 11:07 AM   Checking BG: 2x per Day  Recent FBG Readings: 113,132,122,119,159,135,111,92,123,138 Recent pre-meal BG readings (4:30 pm): 425,956,387,564,332,951,884,166 Recent 2hr PP BG readings:  N/A Recent HS BG readings: N/A Patient has failed these meds in past:  Lantus Patient is currently uncontrolled on the following medications:   Toujeo 19 units nightly (increased on 5/25)  Last diabetic Foot exam: Sees Podiatry regularly Last diabetic Eye exam: 01/12/20 Lab Results  Component Value Date/Time   HMDIABEYEEXA Retinopathy (A) 01/12/2020 12:00 AM    We discussed:   Diet extensively  Breakfast: [place] bacon, potatoes, eggs, cereal, strawberries  Lunch: Apple, peanut butter and jelly sandwich, salad  Dinner: Salad w/ chicken (lettuce, tomatoes, cucumbers, onion strips) w/ [place] or Ranch dressing and crackers, chicken, beets, potatoes, salad, fish  Eats dinner around 5:30pm  Drinks: Water, cherry juice, coffee, orange juice  Recommended drinking plenty of water (64 oz daily)  Recommended limiting sugar content and carbohydrates in pre-packaged foods   Recommended choosing salad dressings low in sugar  Exercise extensively  Pt wants to use her workout machine, but her knee has been hurting too much  Walks around the house and up the hall at her apartment  Has not been able to exercise much due to arthritis  Recommended water aerobics or water walking  Counseled on proper use of Ozempic as well as possible side effects  Plan Continue current medications  Start Ozempic 0.60m weekly on Thursday (5/27) per PCP recommendation after discussing elevated BG levels despite increasing insulin dose  Heart Failure   Type: Diastolic  Last ejection fraction: >65% on 8/20 NYHA Class: III (marked limitation of activity) AHA HF Stage: B (Heart disease present - no symptoms present)  Patient has failed these meds in past: N/A Patient is currently controlled on the following medications:   Furosemide 462mtwice daily   Hydralazine 2549mhree times daily  Isosorbide mononitrate ER 31m12mily  Metolazone 2.5mg 43mry other day prior to morning Lasix dose  Metoprolol Succinate XL 25mg 8my  Potassium 10mEq 35me daily  We  discussed:  Pt states no problems lately   Pt states she does not have potassium, but was able to locate during home visit on 5/26  Forgets to weigh sometimes  Shortness of breath with small activities (walking in house) is a problem  Plan -Continue current medications   Hypertension   Office blood pressures are  BP Readings from Last 3 Encounters:  03/07/20 128/66  01/27/20 114/60  01/08/20 132/79   Patient has failed these meds in the past: N/A Patient is currently controlled on the following medications:   Amlodipine 5mg dai3m Telmisartan 80 mg daily  Patient checks BP at home 1-2x per week  Patient home BP readings are ranging: 112-115/60-80  We discussed:  Diet and exercise extensively  Recommended limiting salt in foods  Denies headaches and dizziness  Recommended check BP if symptomatic  Plan -Continue current medications   Hyperlipidemia  Lipid Panel     Component Value Date/Time   CHOL 165 01/06/2020 1203   TRIG 84 01/06/2020 1203   HDL 78 01/06/2020 1203   CHOLHDL 2.1 01/06/2020 1203   LDLCALC 71 01/06/2020 1203   LABVLDL 16 01/06/2020 1203     The ASCVD Risk score (Goff DC Jr., et al., 2013) failed to calculate for the following reasons:   The 2013 ASCVD risk score is only valid for ages 81 to 35   Patient has failed these meds in past: N/A Patient is currently controlled on the following medications:   Atorvastatin 18m daily (Monday, Wednesday, and Friday)  Ezetimibe 196mat bedtime  Aspirin 8154maily  We discussed:    Denies any side effects at this time.  Plan -Continue current medications  Gout  Uric acid 4.9 on 01/27/20  Patient has failed these meds in past: N/A Patient is currently controlled on the following medications:  Pt was taking allopurinol 200m76mily and colchicine 0.6mg,61mt states she is not currently taking  We discussed:   Diet extensively  Pt was taken off of all medications for  gout  Plan -Continue control with diet and exercise   Depression   Patient has failed these meds in past: Lexapro, alprazolam Patient is currently controlled on the following medications:   Citalopram 20mg 57my  We discussed:    Pt does not currently have this medication  Plan -Will discuss absence of citalopram with PCP and assess patient's need for treatment  Alzheimer's   Patient has failed these meds in past: N/A Patient is currently controlled on the following medications:   Donepezil 10mg d44m  We discussed: N/A  Plan -Continue current medications  Seronegative Rheumatoid Arthritis   Patient has failed these meds in past: N/A Patient is currently controlled on the following medications:   Hydroxychloroquine 200mg da38m Prednisone 2.5mg 2 ta87mts daily with breakfast  We discussed:    Symptoms pretty well controlled on medications  Plan -Continue current medications   Osteoarthritis   Patient has failed these meds in past: Aleve, ibuprofen Patient is currently uncontrolled on the following medications:   Diclofenac 1% gel 2 grams 4 times daily  We discussed:   Pt waiting on knee injections to be approved by insurance  Plan -Continue current medications  Over the Counter Medications   Patient is currently on the following medications:   Vitamin D 2000 units daily  Folbic 2.5/25/2 mg daily  Loratadine 10mg dail22magnesium 250mg at be74me  Align probiotic capsule daily  Plan -Continue current medications  Vaccines   Immunization History  Administered Date(s) Administered  . Influenza, High Dose Seasonal PF 06/25/2018, 06/03/2019  . PFIZER SARS-COV-2 Vaccination 11/24/2019, 12/15/2019  . Pneumococcal Conjugate-13 01/26/2015   Plan -Will review and discuss vaccination history at follow up  Medication Management   [facility] for all medications Uses pill box? Yes, pt also  utilizes a calendar to track insulin and GLP-1 dosing Pt endorses 100% compliance  We discussed: -Importance of medication compliance   Plan -Continue current medication management strategy  Follow up: Weekly phone call to assess BG and 6 week phone visit  Evangelina Delancey CaJannette Fogoinical Pharmacist [facility] [phone]

## 2020-02-09 ENCOUNTER — Other Ambulatory Visit: Payer: Self-pay

## 2020-02-09 MED ORDER — TOUJEO SOLOSTAR 300 UNIT/ML ~~LOC~~ SOPN: 19.0000 [IU] | PEN_INJECTOR | Freq: Every evening | SUBCUTANEOUS | 3 refills | Status: DC

## 2020-02-09 NOTE — Telephone Encounter (Signed)
Please call to schedule patient for Visco knee injections.  Authorized for Synvisc One right knee Deductible does not apply No PA required Insurance to cover 100% of injection cost with a $35.00 copay, and 80% of medication cost.

## 2020-02-15 ENCOUNTER — Other Ambulatory Visit: Payer: Self-pay

## 2020-02-15 ENCOUNTER — Ambulatory Visit: Payer: Medicare Other | Admitting: Physician Assistant

## 2020-02-15 DIAGNOSIS — M1711 Unilateral primary osteoarthritis, right knee: Secondary | ICD-10-CM

## 2020-02-15 MED ORDER — HYLAN G-F 20 48 MG/6ML IX SOSY
48.0000 mg | PREFILLED_SYRINGE | INTRA_ARTICULAR | Status: AC | PRN
  Administered 2020-02-15: 48 mg via INTRA_ARTICULAR

## 2020-02-15 MED ORDER — LIDOCAINE HCL 1 % IJ SOLN
1.5000 mL | INTRAMUSCULAR | Status: AC | PRN
  Administered 2020-02-15: 1.5 mL

## 2020-02-15 NOTE — Progress Notes (Signed)
   Procedure Note  Patient: Maria [name]             Date of Birth: 1926/02/02           MRN: [id number]             Visit Date: 02/15/2020  Procedures: Visit Diagnoses:  1. Primary osteoarthritis of right knee     Synvisc ONE right knee, B/B Large Joint Inj: R knee on 02/15/2020 8:11 AM Indications: pain Details: 22 G 1.5 in needle, medial approach  Arthrogram: No  Medications: 1.5 mL lidocaine 1 %; 48 mg Hylan 48 MG/6ML Aspirate: 0 mL Outcome: tolerated well, no immediate complications Procedure, treatment alternatives, risks and benefits explained, specific risks discussed. Consent was given by the patient. Immediately prior to procedure a time out was called to verify the correct patient, procedure, equipment, support staff and site/side marked as required. Patient was prepped and draped in the usual sterile fashion.     Patient tolerated the procedure well.  Aftercare was discussed.  Hazel Sams, PA-C

## 2020-02-24 ENCOUNTER — Other Ambulatory Visit: Payer: Self-pay

## 2020-02-24 MED ORDER — ATORVASTATIN CALCIUM 40 MG PO TABS: 40.0000 mg | ORAL_TABLET | Freq: Every day | ORAL | 3 refills | Status: DC

## 2020-02-25 ENCOUNTER — Ambulatory Visit: Payer: Self-pay

## 2020-02-25 ENCOUNTER — Telehealth: Payer: Self-pay

## 2020-02-25 ENCOUNTER — Other Ambulatory Visit: Payer: Self-pay | Admitting: Cardiovascular Disease

## 2020-02-25 ENCOUNTER — Other Ambulatory Visit: Payer: Self-pay

## 2020-02-25 DIAGNOSIS — N1832 Chronic kidney disease, stage 3b: Secondary | ICD-10-CM

## 2020-02-25 DIAGNOSIS — Z794 Long term (current) use of insulin: Secondary | ICD-10-CM

## 2020-02-25 DIAGNOSIS — I130 Hypertensive heart and chronic kidney disease with heart failure and stage 1 through stage 4 chronic kidney disease, or unspecified chronic kidney disease: Secondary | ICD-10-CM

## 2020-02-25 DIAGNOSIS — N183 Chronic kidney disease, stage 3 unspecified: Secondary | ICD-10-CM

## 2020-02-25 DIAGNOSIS — E1122 Type 2 diabetes mellitus with diabetic chronic kidney disease: Secondary | ICD-10-CM

## 2020-02-25 DIAGNOSIS — M1711 Unilateral primary osteoarthritis, right knee: Secondary | ICD-10-CM

## 2020-02-25 DIAGNOSIS — E782 Mixed hyperlipidemia: Secondary | ICD-10-CM

## 2020-02-26 NOTE — Chronic Care Management (AMB) (Signed)
  Chronic Care Management   Outreach Note  02/26/2020 Name: Maria [name] MRN: [id number] DOB: 1926/08/03  Referred by: Glendale Chard, MD Reason for referral : Chronic Care Management (FU RN CM Call )   An unsuccessful telephone outreach was attempted today. The patient was referred to the case management team for assistance with care management and care coordination.   Follow Up Plan: Telephone follow up appointment with care management team member scheduled for: 04/11/20  Barb Merino, RN, BSN, CCM Care Management Coordinator [place] [facility]  Direct Phone: [phone]

## 2020-03-07 ENCOUNTER — Other Ambulatory Visit: Payer: Self-pay

## 2020-03-07 ENCOUNTER — Telehealth: Payer: Self-pay

## 2020-03-07 ENCOUNTER — Ambulatory Visit (INDEPENDENT_AMBULATORY_CARE_PROVIDER_SITE_OTHER): Payer: Medicare Other | Admitting: Internal Medicine

## 2020-03-07 ENCOUNTER — Encounter: Payer: Self-pay | Admitting: Internal Medicine

## 2020-03-07 VITALS — BP 128/66 | HR 87 | Temp 98.0°F | Ht 59.8 in | Wt 163.2 lb

## 2020-03-07 DIAGNOSIS — E6609 Other obesity due to excess calories: Secondary | ICD-10-CM | POA: Diagnosis not present

## 2020-03-07 DIAGNOSIS — Z794 Long term (current) use of insulin: Secondary | ICD-10-CM

## 2020-03-07 DIAGNOSIS — I130 Hypertensive heart and chronic kidney disease with heart failure and stage 1 through stage 4 chronic kidney disease, or unspecified chronic kidney disease: Secondary | ICD-10-CM | POA: Diagnosis not present

## 2020-03-07 DIAGNOSIS — Z0000 Encounter for general adult medical examination without abnormal findings: Secondary | ICD-10-CM

## 2020-03-07 DIAGNOSIS — D631 Anemia in chronic kidney disease: Secondary | ICD-10-CM | POA: Diagnosis not present

## 2020-03-07 DIAGNOSIS — N189 Chronic kidney disease, unspecified: Secondary | ICD-10-CM

## 2020-03-07 DIAGNOSIS — E66811 Obesity, class 1: Secondary | ICD-10-CM

## 2020-03-07 DIAGNOSIS — I5032 Chronic diastolic (congestive) heart failure: Secondary | ICD-10-CM

## 2020-03-07 DIAGNOSIS — Z6832 Body mass index (BMI) 32.0-32.9, adult: Secondary | ICD-10-CM

## 2020-03-07 DIAGNOSIS — N183 Chronic kidney disease, stage 3 unspecified: Secondary | ICD-10-CM

## 2020-03-07 DIAGNOSIS — E1122 Type 2 diabetes mellitus with diabetic chronic kidney disease: Secondary | ICD-10-CM | POA: Diagnosis not present

## 2020-03-07 MED ORDER — OZEMPIC (0.25 OR 0.5 MG/DOSE) 2 MG/1.5ML ~~LOC~~ SOPN: 0.5000 mg | PEN_INJECTOR | SUBCUTANEOUS | 2 refills | Status: DC

## 2020-03-07 MED ORDER — ACCU-CHEK FASTCLIX LANCETS MISC: 1 refills | Status: AC

## 2020-03-07 NOTE — Progress Notes (Signed)
This visit occurred during the SARS-CoV-2 public health emergency.  Safety protocols were in place, including screening questions prior to the visit, additional usage of staff PPE, and extensive cleaning of exam room while observing appropriate contact time as indicated for disinfecting solutions.  Subjective:     Patient ID: Maria [name] , female    DOB: 12-28-1925 , 86 y.o.   MRN: [id number]   Chief Complaint  Patient presents with  . Annual Exam  . Diabetes  . Hypertension    HPI  She is here today for a full physical examination. She is ambulatory with a walker. States her sugars are getting better.   Diabetes She presents for her follow-up diabetic visit. She has type 2 diabetes mellitus. Her disease course has been fluctuating. There are no hypoglycemic associated symptoms. Pertinent negatives for diabetes include no blurred vision and no chest pain. There are no hypoglycemic complications. Diabetic complications include nephropathy. Risk factors for coronary artery disease include diabetes mellitus, dyslipidemia, hypertension, obesity, sedentary lifestyle and post-menopausal.  Hypertension This is a chronic problem. The current episode started more than 1 year ago. The problem has been gradually improving since onset. The problem is controlled. Pertinent negatives include no blurred vision, chest pain, palpitations or shortness of breath.     Past Medical History:  Diagnosis Date  . Anxiety   . Arthritis   . Blood transfusion   . Depression   . Diabetes mellitus   . Edema   . Heart murmur   . Hyperlipidemia   . Hypertension   . Sleep apnea    cpap  . Stroke ([place])    3 x tias  . TIA (transient ischemic attack)      Family History  Problem Relation Age of Onset  . Healthy Mother   . Prostate cancer Father      Current Outpatient Medications:  .  Accu-Chek FastClix Lancets MISC, CHECK BLOOD SUGAR BEFORE BREAKFAST AND DINNER, Disp: 306 each, Rfl: 1 .   ACCU-CHEK SMARTVIEW test strip, TEST TWICE DAILY BEFORE BREAKFAST AND DINNER, Disp: 300 strip, Rfl: 5 .  amLODipine (NORVASC) 5 MG tablet, , Disp: , Rfl:  .  aspirin 81 MG tablet, Take 1 tablet (81 mg total) by mouth daily., Disp: , Rfl:  .  atorvastatin (LIPITOR) 40 MG tablet, Take 1 tablet (40 mg total) by mouth daily., Disp: 90 tablet, Rfl: 3 .  Cholecalciferol (VITAMIN D) 2000 UNITS tablet, Take 2,000 Units by mouth daily., Disp: , Rfl:  .  citalopram (CELEXA) 20 MG tablet, Take 20 mg by mouth daily., Disp: , Rfl:  .  diclofenac Sodium (VOLTAREN) 1 % GEL, Apply 2 g topically 4 (four) times daily., Disp: 50 g, Rfl: 2 .  docusate sodium (COLACE) 100 MG capsule, Take 100 mg by mouth daily., Disp: , Rfl:  .  donepezil (ARICEPT) 10 MG tablet, TAKE 1 TABLET DAILY IN THE EVENING, Disp: 90 tablet, Rfl: 3 .  ezetimibe (ZETIA) 10 MG tablet, TAKE 1 TABLET AT BEDTIME, Disp: 90 tablet, Rfl: 3 .  FOLBIC 2.5-25-2 MG TABS tablet, TAKE 1 TABLET DAILY, Disp: 90 tablet, Rfl: 3 .  furosemide (LASIX) 40 MG tablet, TAKE 1 TABLET TWICE A DAY (NEED TO SCHEDULE AN APPOINTMENT), Disp: 90 tablet, Rfl: 1 .  hydrALAZINE (APRESOLINE) 25 MG tablet, TAKE 1 TABLET THREE TIMES A DAY, Disp: 270 tablet, Rfl: 3 .  hydroxychloroquine (PLAQUENIL) 200 MG tablet, Take 1 tablet (200 mg total) by mouth daily., Disp: 90 tablet, Rfl:  0 .  insulin glargine, 1 Unit Dial, (TOUJEO SOLOSTAR) 300 UNIT/ML Solostar Pen, Inject 19 Units into the skin at bedtime., Disp: 4.5 mL, Rfl: 3 .  Insulin Pen Needle (BD PEN NEEDLE MICRO U/F) 32G X 6 MM MISC, Use as directed, Disp: 300 each, Rfl: 2 .  isosorbide mononitrate (IMDUR) 30 MG 24 hr tablet, TAKE 1 TABLET DAILY, Disp: 90 tablet, Rfl: 3 .  loratadine (CLARITIN) 10 MG tablet, Take 10 mg by mouth every morning., Disp: , Rfl:  .  Magnesium 250 MG TABS, Take by mouth at bedtime., Disp: , Rfl:  .  metolazone (ZAROXOLYN) 2.5 MG tablet, TAKE 1 TABLET EVERY OTHER DAY 30 MINUTES PRIOR TO YOUR MORNING LASIX  DOSE, Disp: 45 tablet, Rfl: 6 .  metoprolol succinate (TOPROL-XL) 25 MG 24 hr tablet, TAKE 1 TABLET(25 MG) BY MOUTH DAILY, Disp: 90 tablet, Rfl: 0 .  potassium chloride (KLOR-CON) 10 MEQ tablet, , Disp: , Rfl:  .  predniSONE (DELTASONE) 2.5 MG tablet, Take 3 tablets (7.5 mg total) by mouth daily with breakfast., Disp: 270 tablet, Rfl: 0 .  telmisartan (MICARDIS) 80 MG tablet, TAKE 1 TABLET DAILY, Disp: 90 tablet, Rfl: 3 .  allopurinol (ZYLOPRIM) 100 MG tablet, Take 2 tablets (200 mg total) by mouth daily. (Patient not taking: Reported on 03/07/2020), Disp: 180 tablet, Rfl: 0   Allergies  Allergen Reactions  . Ace Inhibitors Other (See Comments)    Angioedema   . Valsartan Other (See Comments)     The patient states she uses post menopausal status for birth control. Last LMP was No LMP recorded. Patient has had a hysterectomy.. Negative for Dysmenorrhea Negative for: breast discharge, breast lump(s), breast pain and breast self exam. Associated symptoms include abnormal vaginal bleeding. Pertinent negatives include abnormal bleeding (hematology), anxiety, decreased libido, depression, difficulty falling sleep, dyspareunia, history of infertility, nocturia, sexual dysfunction, sleep disturbances, urinary incontinence, urinary urgency, vaginal discharge and vaginal itching. Diet regular.The patient states her exercise level is    . The patient's tobacco use is:  Social History   Tobacco Use  Smoking Status Never Smoker  Smokeless Tobacco Never Used  . She has been exposed to passive smoke. The patient's alcohol use is:  Social History   Substance and Sexual Activity  Alcohol Use No    Review of Systems  Constitutional: Negative.   HENT: Negative.   Eyes: Negative.  Negative for blurred vision.  Respiratory: Negative.  Negative for shortness of breath.   Cardiovascular: Negative.  Negative for chest pain and palpitations.  Gastrointestinal: Negative.   Endocrine: Negative.    Genitourinary: Negative.   Musculoskeletal: Positive for arthralgias.  Skin: Negative.   Allergic/Immunologic: Negative.   Neurological: Negative.   Hematological: Negative.   Psychiatric/Behavioral: Negative.      Today's Vitals   03/07/20 1002  BP: 128/66  Pulse: 87  Temp: 98 F (36.7 C)  TempSrc: Oral  Weight: 163 lb 3.2 oz (74 kg)  Height: 4' 11.8" (1.519 m)  PainSc: 10-Worst pain ever  PainLoc: Hand   Body mass index is 32.09 kg/m.   Objective:  Physical Exam Vitals and nursing note reviewed.  Constitutional:      Appearance: Normal appearance. She is obese.  HENT:     Head: Normocephalic and atraumatic.     Right Ear: Tympanic membrane, ear canal and external ear normal.     Left Ear: Tympanic membrane, ear canal and external ear normal.     Nose:     Comments:  Deferred, masked    Mouth/Throat:     Comments: Deferred, masked Eyes:     Extraocular Movements: Extraocular movements intact.     Conjunctiva/sclera: Conjunctivae normal.     Pupils: Pupils are equal, round, and reactive to light.  Cardiovascular:     Rate and Rhythm: Normal rate and regular rhythm.     Pulses:          Dorsalis pedis pulses are 1+ on the right side and 1+ on the left side.     Heart sounds: Normal heart sounds.  Pulmonary:     Effort: Pulmonary effort is normal.     Breath sounds: Normal breath sounds.  Chest:     Breasts: Tanner Score is 5.        Right: Normal.   Abdominal:     General: Bowel sounds are normal.     Palpations: Abdomen is soft.     Comments: Rounded, soft  Genitourinary:    Comments: deferred Musculoskeletal:        General: Normal range of motion.     Cervical back: Normal range of motion and neck supple.  Feet:     Right foot:     Protective Sensation: 5 sites tested. 5 sites sensed.     Skin integrity: Callus and dry skin present.     Toenail Condition: Right toenails are abnormally thick.     Left foot:     Protective Sensation: 5 sites  tested. 5 sites sensed.     Skin integrity: Callus and dry skin present.     Toenail Condition: Left toenails are abnormally thick.  Skin:    General: Skin is warm and dry.  Neurological:     General: No focal deficit present.     Mental Status: She is alert and oriented to person, place, and time.  Psychiatric:        Mood and Affect: Mood normal.        Behavior: Behavior normal.         Assessment And Plan:     1. Routine general medical examination at health care facility  A full exam was performed.  Importance of monthly self breast exams was discussed with the patient. PATIENT IS ADVISED TO GET 30-45 MINUTES REGULAR EXERCISE NO LESS THAN FOUR TO FIVE DAYS PER WEEK - BOTH WEIGHTBEARING EXERCISES AND AEROBIC ARE RECOMMENDED.  SHE IS ADVISED TO FOLLOW A HEALTHY DIET WITH AT LEAST SIX FRUITS/VEGGIES PER DAY, DECREASE INTAKE OF RED MEAT, AND TO INCREASE FISH INTAKE TO TWO DAYS PER WEEK.  MEATS/FISH SHOULD NOT BE FRIED, BAKED OR BROILED IS PREFERABLE.  I SUGGEST WEARING SPF 50 SUNSCREEN ON EXPOSED PARTS AND ESPECIALLY WHEN IN THE DIRECT SUNLIGHT FOR AN EXTENDED PERIOD OF TIME.  PLEASE AVOID FAST FOOD RESTAURANTS AND INCREASE YOUR WATER INTAKE.   2. Type 2 diabetes mellitus with stage 3 chronic kidney disease, with long-term current use of insulin, unspecified whether stage 3a or 3b CKD ([place])  Diabetic foot exam was performed. I DISCUSSED WITH THE PATIENT AT LENGTH REGARDING THE GOALS OF GLYCEMIC CONTROL AND POSSIBLE LONG-TERM COMPLICATIONS.  I  ALSO STRESSED THE IMPORTANCE OF COMPLIANCE WITH HOME GLUCOSE MONITORING, DIETARY RESTRICTIONS INCLUDING AVOIDANCE OF SUGARY DRINKS/PROCESSED FOODS,  ALONG WITH REGULAR EXERCISE.  I  ALSO STRESSED THE IMPORTANCE OF ANNUAL EYE EXAMS, SELF FOOT CARE AND COMPLIANCE WITH OFFICE VISITS.  - Hemoglobin A1c - CBC with Diff  3. Hypertensive heart and kidney disease with chronic diastolic congestive heart failure  and stage 3 chronic kidney disease,  unspecified whether stage 3a or 3b CKD ([facility])  Chronic, well controlled. She will continue with current meds. She is encouraged to avoid adding salt to her foods. EKG performed, NSr w/ RBBB - no new changes noted.   - EKG 12-Lead  4. Anemia of renal disease  She is still awaiting Renal consult, referral has been placed. I will check labs as listed below. She does not wish to take oral iron supplements at this time.   - Iron and IBC ([id number]) - Ferritin  5. Class 1 obesity due to excess calories with serious comorbidity and body mass index (BMI) of 32.0 to 32.9 in adult  She is encouraged to continue to perform chair exercises. She is to aim for BMI 30 or less to decrease cardiac risk.    Maximino Greenland, MD    THE PATIENT IS ENCOURAGED TO PRACTICE SOCIAL DISTANCING DUE TO THE COVID-19 PANDEMIC.

## 2020-03-07 NOTE — Patient Instructions (Signed)
Health Maintenance, Female Adopting a healthy lifestyle and getting preventive care are important in promoting health and wellness. Ask your health care provider about:  The right schedule for you to have regular tests and exams.  Things you can do on your own to prevent diseases and keep yourself healthy. What should I know about diet, weight, and exercise? Eat a healthy diet   Eat a diet that includes plenty of vegetables, fruits, low-fat dairy products, and lean protein.  Do not eat a lot of foods that are high in solid fats, added sugars, or sodium. Maintain a healthy weight Body mass index (BMI) is used to identify weight problems. It estimates body fat based on height and weight. Your health care provider can help determine your BMI and help you achieve or maintain a healthy weight. Get regular exercise Get regular exercise. This is one of the most important things you can do for your health. Most adults should:  Exercise for at least 150 minutes each week. The exercise should increase your heart rate and make you sweat (moderate-intensity exercise).  Do strengthening exercises at least twice a week. This is in addition to the moderate-intensity exercise.  Spend less time sitting. Even light physical activity can be beneficial. Watch cholesterol and blood lipids Have your blood tested for lipids and cholesterol at 84 years of age, then have this test every 5 years. Have your cholesterol levels checked more often if:  Your lipid or cholesterol levels are high.  You are older than 84 years of age.  You are at high risk for heart disease. What should I know about cancer screening? Depending on your health history and family history, you may need to have cancer screening at various ages. This may include screening for:  Breast cancer.  Cervical cancer.  Colorectal cancer.  Skin cancer.  Lung cancer. What should I know about heart disease, diabetes, and high blood  pressure? Blood pressure and heart disease  High blood pressure causes heart disease and increases the risk of stroke. This is more likely to develop in people who have high blood pressure readings, are of African descent, or are overweight.  Have your blood pressure checked: ? Every 3-5 years if you are 18-39 years of age. ? Every year if you are 40 years old or older. Diabetes Have regular diabetes screenings. This checks your fasting blood sugar level. Have the screening done:  Once every three years after age 40 if you are at a normal weight and have a low risk for diabetes.  More often and at a younger age if you are overweight or have a high risk for diabetes. What should I know about preventing infection? Hepatitis B If you have a higher risk for hepatitis B, you should be screened for this virus. Talk with your health care provider to find out if you are at risk for hepatitis B infection. Hepatitis C Testing is recommended for:  Everyone born from 1945 through 1965.  Anyone with known risk factors for hepatitis C. Sexually transmitted infections (STIs)  Get screened for STIs, including gonorrhea and chlamydia, if: ? You are sexually active and are younger than 84 years of age. ? You are older than 84 years of age and your health care provider tells you that you are at risk for this type of infection. ? Your sexual activity has changed since you were last screened, and you are at increased risk for chlamydia or gonorrhea. Ask your health care provider if   you are at risk.  Ask your health care provider about whether you are at high risk for HIV. Your health care provider may recommend a prescription medicine to help prevent HIV infection. If you choose to take medicine to prevent HIV, you should first get tested for HIV. You should then be tested every 3 months for as long as you are taking the medicine. Pregnancy  If you are about to stop having your period (premenopausal) and  you may become pregnant, seek counseling before you get pregnant.  Take 400 to 800 micrograms (mcg) of folic acid every day if you become pregnant.  Ask for birth control (contraception) if you want to prevent pregnancy. Osteoporosis and menopause Osteoporosis is a disease in which the bones lose minerals and strength with aging. This can result in bone fractures. If you are 65 years old or older, or if you are at risk for osteoporosis and fractures, ask your health care provider if you should:  Be screened for bone loss.  Take a calcium or vitamin D supplement to lower your risk of fractures.  Be given hormone replacement therapy (HRT) to treat symptoms of menopause. Follow these instructions at home: Lifestyle  Do not use any products that contain nicotine or tobacco, such as cigarettes, e-cigarettes, and chewing tobacco. If you need help quitting, ask your health care provider.  Do not use street drugs.  Do not share needles.  Ask your health care provider for help if you need support or information about quitting drugs. Alcohol use  Do not drink alcohol if: ? Your health care provider tells you not to drink. ? You are pregnant, may be pregnant, or are planning to become pregnant.  If you drink alcohol: ? Limit how much you use to 0-1 drink a day. ? Limit intake if you are breastfeeding.  Be aware of how much alcohol is in your drink. In the [place] one drink equals one 12 oz bottle of beer (355 mL), one 5 oz glass of wine (148 mL), or one 1 oz glass of hard liquor (44 mL). General instructions  Schedule regular health, dental, and eye exams.  Stay current with your vaccines.  Tell your health care provider if: ? You often feel depressed. ? You have ever been abused or do not feel safe at home. Summary  Adopting a healthy lifestyle and getting preventive care are important in promoting health and wellness.  Follow your health care provider's instructions about healthy  diet, exercising, and getting tested or screened for diseases.  Follow your health care provider's instructions on monitoring your cholesterol and blood pressure. This information is not intended to replace advice given to you by your health care provider. Make sure you discuss any questions you have with your health care provider. Document Revised: 08/20/2018 Document Reviewed: 08/20/2018 Elsevier Patient Education  2020 [employer]  

## 2020-03-07 NOTE — Telephone Encounter (Signed)
The pt was told that Dr. Baird Cancer said that it's ok for the pt to take the atorvastatin that she called and said that she had at home.

## 2020-03-08 LAB — CBC WITH DIFFERENTIAL/PLATELET
Basophils Absolute: 0 10*3/uL (ref 0.0–0.2)
Basos: 1 %
EOS (ABSOLUTE): 0.1 10*3/uL (ref 0.0–0.4)
Eos: 1 %
Hematocrit: 29.3 % — ABNORMAL LOW (ref 34.0–46.6)
Hemoglobin: 9.5 g/dL — ABNORMAL LOW (ref 11.1–15.9)
Immature Grans (Abs): 0 10*3/uL (ref 0.0–0.1)
Immature Granulocytes: 0 %
Lymphocytes Absolute: 1.5 10*3/uL (ref 0.7–3.1)
Lymphs: 23 %
MCH: 27.6 pg (ref 26.6–33.0)
MCHC: 32.4 g/dL (ref 31.5–35.7)
MCV: 85 fL (ref 79–97)
Monocytes Absolute: 0.5 10*3/uL (ref 0.1–0.9)
Monocytes: 8 %
Neutrophils Absolute: 4.3 10*3/uL (ref 1.4–7.0)
Neutrophils: 67 %
Platelets: 203 10*3/uL (ref 150–450)
RBC: 3.44 x10E6/uL — ABNORMAL LOW (ref 3.77–5.28)
RDW: 15.5 % — ABNORMAL HIGH (ref 11.7–15.4)
WBC: 6.3 10*3/uL (ref 3.4–10.8)

## 2020-03-08 LAB — FERRITIN: Ferritin: 52 ng/mL (ref 15–150)

## 2020-03-08 LAB — HEMOGLOBIN A1C
Est. average glucose Bld gHb Est-mCnc: 186 mg/dL
Hgb A1c MFr Bld: 8.1 % — ABNORMAL HIGH (ref 4.8–5.6)

## 2020-03-08 LAB — IRON AND TIBC
Iron Saturation: 24 % (ref 15–55)
Iron: 57 ug/dL (ref 27–139)
Total Iron Binding Capacity: 240 ug/dL — ABNORMAL LOW (ref 250–450)
UIBC: 183 ug/dL (ref 118–369)

## 2020-03-09 NOTE — Patient Instructions (Signed)
Visit Information  Goals Addressed            This Visit's Progress   . Pharmacy Care Plan       CARE PLAN ENTRY (see longitudinal plan of care for additional care plan information)  Current Barriers:  . Chronic Disease Management support, education, and care coordination needs related to Hypertension, Hyperlipidemia, Diabetes, and Heart Failure   Hypertension BP Readings from Last 3 Encounters:  01/27/20 114/60  01/08/20 132/79  01/06/20 138/64   . Pharmacist Clinical Goal(s): o Over the next 90 days, patient will work with PharmD and providers to maintain BP goal <130/80 . Current regimen:  o Amlodipine 5mg  daily o Telmisartan 80mg  daily . Interventions: o Dietary recommendations provided (limit salt intake) . Patient self care activities - Over the next 90 days, patient will: o Check BP 1-2 times weekly and if symptomatic, document, and provide at future appointments o Ensure daily salt intake < 2300 mg/day  Hyperlipidemia Lab Results  Component Value Date/Time   LDLCALC 71 01/06/2020 12:03 PM   . Pharmacist Clinical Goal(s): o Over the next 90 days, patient will work with PharmD and providers to maintain LDL goal < 70 . Current regimen:  o Atorvastatin 40mg  three times weekly o Ezetimibe 10mg  daily . Interventions: o Provided dietary and exercise recommendations . Patient self care activities - Over the next 90 days, patient will: o Take cholesterol medication as directed o Exercise as able  Diabetes Lab Results  Component Value Date/Time   HGBA1C 8.6 (H) 01/06/2020 12:03 PM   HGBA1C 6.7 (H) 09/15/2019 03:33 PM   . Pharmacist Clinical Goal(s): o Over the next 90 days, patient will work with PharmD and providers to achieve A1c goal <7% . Current regimen:  o Toujeo 19 units nightly . Interventions: o After collaboration with PCP, patient is going to start Ozempic 0.25mg  once weekly o Took sample pen to patient's home on 5/26 o Counseled patient proper  injection technique, once weekly dosing, and possible side effects of Ozempic . Patient self care activities - Over the next 90 days, patient will: o Check blood sugar twice daily, document, and provide at future appointments o Contact provider with any episodes of hypoglycemia o Start Ozempic 0.25mg  once weekly  Heart Failure . Pharmacist Clinical Goal(s) o Over the next 90 days, patient will work with PharmD and providers to reduce symptoms associated with heart failure . Current regimen:   Furosemide 40mg  twice daily   Hydralazine 25mg  three times daily  Isosorbide mononitrate ER 30mg  daily  Metolazone 2.5mg  every other day prior to morning Lasix dose  Metoprolol Succinate XL 25mg  daily  Potassium 50mEq twice daily . Interventions: o Encouraged patient to exercise as able . Patient self care activities - Over the next 90 days, patient will: o Weigh daily and record to check for fluid buildup o Notify PCP of significant shortness of breath o  Medication management . Pharmacist Clinical Goal(s): o Over the next 90 days, patient will work with PharmD and providers to achieve optimal medication adherence . Current pharmacy: [facility] . Interventions o Comprehensive medication review performed. o Continue current medication management strategy . Patient self care activities - Over the next 90 days, patient will: o Focus on medication adherence by continued use of pillbox and calendar reminders o Take medications as prescribed o Report any questions or concerns to PharmD and/or provider(s)  Initial goal documentation        [name]  was given information about Chronic Care Management services today including:  1. CCM service includes personalized support from designated clinical staff supervised by her physician, including individualized plan of care and coordination with other care providers 2. 24/7 contact phone numbers for assistance  for urgent and routine care needs. 3. Standard insurance, coinsurance, copays and deductibles apply for chronic care management only during months in which we provide at least 20 minutes of these services. Most insurances cover these services at 100%, however patients may be responsible for any copay, coinsurance and/or deductible if applicable. This service may help you avoid the need for more expensive face-to-face services. 4. Only one practitioner may furnish and bill the service in a calendar month. 5. The patient may stop CCM services at any time (effective at the end of the month) by phone call to the office staff.  Patient agreed to services and verbal consent obtained.   Patient verbalizes understanding of instructions provided today.  Telephone follow up appointment with pharmacy team member scheduled for: 03/17/20 @ 4:30 PM  Jannette Fogo, PharmD Clinical Pharmacist [facility] [phone]

## 2020-03-16 ENCOUNTER — Telehealth: Payer: Self-pay

## 2020-03-16 NOTE — Telephone Encounter (Signed)
Pt and pharmacy notified of approval of ozempic. Cost to pt is $0

## 2020-03-16 NOTE — Telephone Encounter (Signed)
-----   Message from Glendale Chard, MD sent at 03/15/2020  9:00 PM EDT ----- Your hba1c is 8.1, this is slightly better. It is down from 8.6, great job! This will continue to improve.   Your blood count is stable, but on the low end.  Have you received an appointment from the kidney doctor yet?

## 2020-03-17 ENCOUNTER — Telehealth: Payer: Self-pay

## 2020-03-18 ENCOUNTER — Other Ambulatory Visit: Payer: Self-pay

## 2020-03-18 ENCOUNTER — Ambulatory Visit: Payer: Self-pay

## 2020-03-18 DIAGNOSIS — Z794 Long term (current) use of insulin: Secondary | ICD-10-CM

## 2020-03-18 DIAGNOSIS — E782 Mixed hyperlipidemia: Secondary | ICD-10-CM

## 2020-03-18 DIAGNOSIS — E1122 Type 2 diabetes mellitus with diabetic chronic kidney disease: Secondary | ICD-10-CM

## 2020-03-23 ENCOUNTER — Other Ambulatory Visit: Payer: Self-pay

## 2020-03-23 ENCOUNTER — Encounter: Payer: Self-pay | Admitting: Podiatry

## 2020-03-23 ENCOUNTER — Ambulatory Visit (INDEPENDENT_AMBULATORY_CARE_PROVIDER_SITE_OTHER): Payer: Medicare Other | Admitting: Podiatry

## 2020-03-23 DIAGNOSIS — B351 Tinea unguium: Secondary | ICD-10-CM | POA: Diagnosis not present

## 2020-03-23 DIAGNOSIS — E1151 Type 2 diabetes mellitus with diabetic peripheral angiopathy without gangrene: Secondary | ICD-10-CM | POA: Diagnosis not present

## 2020-03-23 DIAGNOSIS — M79676 Pain in unspecified toe(s): Secondary | ICD-10-CM

## 2020-03-23 DIAGNOSIS — M2011 Hallux valgus (acquired), right foot: Secondary | ICD-10-CM | POA: Diagnosis not present

## 2020-03-23 DIAGNOSIS — M2041 Other hammer toe(s) (acquired), right foot: Secondary | ICD-10-CM

## 2020-03-23 DIAGNOSIS — M2042 Other hammer toe(s) (acquired), left foot: Secondary | ICD-10-CM

## 2020-03-23 NOTE — Progress Notes (Signed)
This patient returns to my office for at risk foot care.  This patient requires this care by a professional since this patient will be at risk due to having diabetes with angiopathy and neuropathy and CKD.    This patient is unable to cut nails herself since the patient cannot reach her nails.These nails are painful walking and wearing shoes.  This patient presents for at risk foot care today.  General Appearance  Alert, conversant and in no acute stress.  Vascular  Dorsalis pedis and posterior tibial weakly  pulses are palpable  bilaterally.  Capillary return is within normal limits  bilaterally. Temperature is within normal limits  bilaterally.  Neurologic  Senn-Weinstein monofilament wire test diminished  bilaterally. Muscle power within normal limits bilaterally.  Nails Thick disfigured discolored nails with subungual debris  from hallux to fifth toes bilaterally. No evidence of bacterial infection or drainage bilaterally.  Orthopedic  No limitations of motion  feet .  No crepitus or effusions noted.  No bony pathology or digital deformities noted.  Skin  normotropic skin with no porokeratosis noted bilaterally.  No signs of infections or ulcers noted.     Onychomycosis  Pain in right toes  Pain in left toes  Consent was obtained for treatment procedures.   Mechanical debridement of nails 1-5  bilaterally performed with a nail nipper.  Filed with dremel without incident.    Return office visit  10 weeks                    Told patient to return for periodic foot care and evaluation due to potential at risk complications.   Gardiner Barefoot DPM

## 2020-03-29 ENCOUNTER — Other Ambulatory Visit: Payer: Self-pay | Admitting: Cardiovascular Disease

## 2020-04-01 ENCOUNTER — Other Ambulatory Visit: Payer: Self-pay

## 2020-04-01 ENCOUNTER — Ambulatory Visit (INDEPENDENT_AMBULATORY_CARE_PROVIDER_SITE_OTHER): Payer: Medicare Other

## 2020-04-01 DIAGNOSIS — N183 Chronic kidney disease, stage 3 unspecified: Secondary | ICD-10-CM | POA: Diagnosis not present

## 2020-04-01 DIAGNOSIS — Z794 Long term (current) use of insulin: Secondary | ICD-10-CM

## 2020-04-01 DIAGNOSIS — E782 Mixed hyperlipidemia: Secondary | ICD-10-CM

## 2020-04-01 DIAGNOSIS — E1122 Type 2 diabetes mellitus with diabetic chronic kidney disease: Secondary | ICD-10-CM

## 2020-04-04 ENCOUNTER — Other Ambulatory Visit: Payer: Self-pay | Admitting: Rheumatology

## 2020-04-04 DIAGNOSIS — R200 Anesthesia of skin: Secondary | ICD-10-CM

## 2020-04-04 DIAGNOSIS — M0600 Rheumatoid arthritis without rheumatoid factor, unspecified site: Secondary | ICD-10-CM

## 2020-04-04 MED ORDER — PREDNISONE 2.5 MG PO TABS: 7.5000 mg | ORAL_TABLET | Freq: Every day | ORAL | 0 refills | Status: DC

## 2020-04-04 MED ORDER — HYDROXYCHLOROQUINE SULFATE 200 MG PO TABS: 200.0000 mg | ORAL_TABLET | Freq: Every day | ORAL | 0 refills | Status: AC

## 2020-04-04 NOTE — Telephone Encounter (Signed)
Last Visit: 01/27/2020 Next Visit: 06/02/2020 Labs: 01/27/2020 Creatinine is elevated and GFR is low but improving RBC count, Hgb, and Hct are low but stable. Anemia likely due to chronic kidney disease.  Eye exam: 01/25/2020 normal   Current Dose per office note 01/27/2020: Plaquenil 200 mg 1 tablet by mouth daily and prednisone 7.5 mg daily. [id number] rheumatoid arthritis   Okay to refill Plaquenil an prednisone?  Patient states she still has loss of feeling in her fingers on both right and left hands.  Patient states they are cold to touch, but is still able to make a fist.  Please advise.

## 2020-04-04 NOTE — Addendum Note (Signed)
Addended by: Carole Binning on: 04/04/2020 03:34 PM   Modules accepted: Orders

## 2020-04-04 NOTE — Telephone Encounter (Signed)
Please refer the patient to neurology for further evaluation of the numbness she is experiencing in both hands.  Ok to refill prednisone and PLQ.

## 2020-04-04 NOTE — Telephone Encounter (Signed)
Patient called stating she only has a couple of pills left of her Prednisone and Hydroxychloroquine and isn't sure if Dr. Estanislado Pandy wants her to continue taking the medication.  Patient states she still has loss of feeling in her fingers on both right and left hands.  Patient states they are cold to touch, but is still able to make a fist.  Please advise.

## 2020-04-04 NOTE — Telephone Encounter (Signed)
Patient advised we will place a referral to neurology for further evaluation of the numbness she is experiencing in both hands.   Patient states she she has tapered her Prednisone. Patient states she took Prednisone 7.5 mg until 01/28/2020, then started 5 mg daily until 02/28/2020. Patient then tapered to 2.5 mg.  Should patient discontinue Prednisone?

## 2020-04-04 NOTE — Telephone Encounter (Signed)
Thank you for updating me. Ok to discontinue Prednisone if the patient has not had any recent flares.  Please call the pharmacy to discontinue the prednisone prescription. She will continue taking PLQ as prescribed.

## 2020-04-04 NOTE — Telephone Encounter (Signed)
Contacted pharmacy and cancelled the prednisone prescription. Patient advised.

## 2020-04-05 ENCOUNTER — Other Ambulatory Visit: Payer: Self-pay | Admitting: Cardiovascular Disease

## 2020-04-06 ENCOUNTER — Ambulatory Visit: Payer: Self-pay

## 2020-04-06 DIAGNOSIS — Z794 Long term (current) use of insulin: Secondary | ICD-10-CM | POA: Diagnosis not present

## 2020-04-06 DIAGNOSIS — N183 Chronic kidney disease, stage 3 unspecified: Secondary | ICD-10-CM | POA: Diagnosis not present

## 2020-04-06 DIAGNOSIS — E782 Mixed hyperlipidemia: Secondary | ICD-10-CM

## 2020-04-06 DIAGNOSIS — E1122 Type 2 diabetes mellitus with diabetic chronic kidney disease: Secondary | ICD-10-CM | POA: Diagnosis not present

## 2020-04-06 NOTE — Patient Instructions (Signed)
Visit Information  Goals Addressed            This Visit's Progress   . Diabetes Management       CARE PLAN ENTRY  Current Barriers:  . Diabetes: Uncontrolled; complicated by chronic medical conditions including hypertension, hyperlipidemia, CKD Lab Results  Component Value Date   HGBA1C 8.6 (H) 01/06/2020 .   Lab Results  Component Value Date   CREATININE 1.60 (H) 01/06/2020   CREATININE 1.80 (H) 11/26/2019   CREATININE 1.60 (H) 09/15/2019   . Current antihyperglycemic regimen: Toujeo 20 units daily, Ozempic 0.5mg  weekly . Current blood glucose readings:  o Recent FBG Readings: 89,75 o Recent pre-meal BG readings (around 4pm before Toujeo dose): 82 Pharmacist Clinical Goal(s):  Marland Kitchen Over the next 30 days, patient will work with PharmD and primary care provider to control blood sugar.  Interventions: . Continue Ozempic 0.5mg  once weekly . Decrease Toujeo to 17 units daily (after consulting with PCP via in basket message) . Provided dietary and exercise recommendations  Patient Self Care Activities:  . Patient will check blood glucose twice daily, document, and provide to PharmD in 10 days . Patient will administer 17 units of Toujeo daily and Ozempic 0.5mg  once weekly . Patient will take medications as prescribed . Patient will contact provider with any episodes of hypoglycemia . Patient will report any questions or concerns to provider   Please see past updates related to this goal by clicking on the "Past Updates" button in the selected goal         Patient verbalizes understanding of instructions provided today.   Telephone follow up appointment with pharmacy team member scheduled for: 04/15/20  Jannette Fogo, PharmD Clinical Pharmacist [facility] [phone]

## 2020-04-06 NOTE — Chronic Care Management (AMB) (Signed)
   Chronic Care Management Pharmacy  Name: Maria [name]  MRN: [id number] DOB: [birth year]  Chief Complaint/ HPI  Maria [name],  86 y.o. , female called today to notify CCM PharmD of concerns about recent low blood sugar readings.  PCP : Glendale Chard, MD  Their chronic conditions include: Hypertension, CHF, Diabetes, CKD, Hyperlipidemia and Gout  Current Diagnosis/Assessment:  Goals Addressed            This Visit's Progress   . Diabetes Management       CARE PLAN ENTRY  Current Barriers:  . Diabetes: Uncontrolled; complicated by chronic medical conditions including hypertension, hyperlipidemia, CKD Lab Results  Component Value Date   HGBA1C 8.6 (H) 01/06/2020 .   Lab Results  Component Value Date   CREATININE 1.60 (H) 01/06/2020   CREATININE 1.80 (H) 11/26/2019   CREATININE 1.60 (H) 09/15/2019   . Current antihyperglycemic regimen: Toujeo 20 units daily, Ozempic 0.5mg  weekly . Current blood glucose readings:  o Recent FBG Readings: 89,75 o Recent pre-meal BG readings (around 4pm before Toujeo dose): 82 Pharmacist Clinical Goal(s):  Marland Kitchen Over the next 30 days, patient will work with PharmD and primary care provider to control blood sugar.  Interventions: . Continue Ozempic 0.5mg  once weekly . Decrease Toujeo to 17 units daily (after consulting with PCP via in basket message) . Provided dietary and exercise recommendations  Patient Self Care Activities:  . Patient will check blood glucose twice daily, document, and provide to PharmD in 10 days . Patient will administer 17 units of Toujeo daily and Ozempic 0.5mg  once weekly . Patient will take medications as prescribed . Patient will contact provider with any episodes of hypoglycemia . Patient will report any questions or concerns to provider   Please see past updates related to this goal by clicking on the "Past Updates" button in the selected goal         Diabetes   Recent Relevant Labs: Lab Results    Component Value Date/Time   HGBA1C 8.6 (H) 01/06/2020 12:03 PM   HGBA1C 6.7 (H) 09/15/2019 03:33 PM   MICROALBUR 10 09/15/2019 12:20 PM   MICROALBUR 10 03/03/2019 12:52 PM    Checking BG: 2x per Day Recent FBG Readings: 89,75 Recent pre-meal BG readings (around 4pm before Toujeo dose): 82  Patient has failed these meds in past: Lantus Patient is currently uncontrolled on the following medications:   Toujeo 20 units in the afternoon  Ozempic 0.5mg  weekly on Thursdays  We discussed  Pt reports that her prednisone was discontinued on Monday, 7/26  After stopping prednisone she noticed lower readings yesterday and this morning  Collaborated with PCP via in basket message about decreasing Toujeo dose due to pt's decrease in BG and stopping prednisone  Called and notified pt to decrease Toujeo dose to 17 units daily and notify PharmD with any further low or high BG readings  Plan Continue Ozempic 0.5mg  once weekly Decrease Toujeo to 17 units daily  Follow up: 10 days via phone  Jannette Fogo, PharmD Clinical Pharmacist [facility] [phone]

## 2020-04-06 NOTE — Patient Instructions (Signed)
Visit Information  Goals Addressed            This Visit's Progress   . Diabetes Management       CARE PLAN ENTRY  Current Barriers:  . Diabetes: Uncontrolled; complicated by chronic medical conditions including hypertension, hyperlipidemia, CKD Lab Results  Component Value Date   HGBA1C 8.6 (H) 01/06/2020 .   Lab Results  Component Value Date   CREATININE 1.60 (H) 01/06/2020   CREATININE 1.80 (H) 11/26/2019   CREATININE 1.60 (H) 09/15/2019   . Current antihyperglycemic regimen: Toujeo 12 units daily . Current blood glucose readings:  o Recent FBG Readings: 768,08,81,103,15,94,58,592,92,44,62,86 o Recent pre-meal BG readings (around 4pm before Toujeo dose): 152,135,130,144,108,123,175,116,250,102,170,133 Pharmacist Clinical Goal(s):  Marland Kitchen Over the next 14 days, patient will work with PharmD and primary care provider to control blood sugar.  Interventions: . Continue Toujeo 20 units daily and Ozempic 0.5mg  once weekly . Provided dietary and exercise recommendations  Patient Self Care Activities:  . Patient will check blood glucose twice daily, document, and provide to PharmD in 14 days . Patient will administer 20 units of Toujeo daily and Ozempic 0.5mg  once weekly . Patient will take medications as prescribed . Patient will contact provider with any episodes of hypoglycemia . Patient will report any questions or concerns to provider   Please see past updates related to this goal by clicking on the "Past Updates" button in the selected goal         Patient verbalizes understanding of instructions provided today.   Telephone follow up appointment with pharmacy team member scheduled for: 04/01/20  Jannette Fogo, PharmD Clinical Pharmacist [facility] [phone]

## 2020-04-06 NOTE — Chronic Care Management (AMB) (Signed)
   Chronic Care Management Pharmacy  Name: Maria [name]  MRN: [id number] DOB: 03-29-1926  Chief Complaint/ HPI  [name],  86 y.o. , female presents for follow up phone call regarding blood sugar readings on Toujeo and Ozempic.   PCP : Glendale Chard, MD  Their chronic conditions include: Hypertension, CHF, Diabetes, CKD, Hyperlipidemia and Gout  Current Diagnosis/Assessment:  Goals Addressed            This Visit's Progress   . Diabetes Management       CARE PLAN ENTRY  Current Barriers:  . Diabetes: Uncontrolled; complicated by chronic medical conditions including hypertension, hyperlipidemia, CKD Lab Results  Component Value Date   HGBA1C 8.6 (H) 01/06/2020 .   Lab Results  Component Value Date   CREATININE 1.60 (H) 01/06/2020   CREATININE 1.80 (H) 11/26/2019   CREATININE 1.60 (H) 09/15/2019   . Current antihyperglycemic regimen: Toujeo 12 units daily . Current blood glucose readings:  o Recent FBG Readings: 94,133,93,86,136,105,103,90,85,81,107,102,99,89 o Recent pre-meal BG readings (around 4pm before Toujeo dose): [phone] Pharmacist Clinical Goal(s):  Marland Kitchen Over the next 14 days, patient will work with PharmD and primary care provider to control blood sugar.  Interventions: . Continue Toujeo 20 units daily and Ozempic 0.5mg  once weekly . Patient reported missing dose of Ozempic yesterday. Advised patient to administer Ozempic today and resume Thursday dosing next week. . Provided dietary and exercise recommendations  Patient Self Care Activities:  . Patient will check blood glucose twice daily, document, and provide to PharmD in 14 days . Patient will administer 20 units of Toujeo daily and Ozempic 0.5mg  once weekly . Patient will take medications as prescribed . Patient will contact provider with any episodes of hypoglycemia . Patient will report any questions or concerns to provider   Please see past  updates related to this goal by clicking on the "Past Updates" button in the selected goal         Diabetes   Recent Relevant Labs: Lab Results  Component Value Date/Time   HGBA1C 8.6 (H) 01/06/2020 12:03 PM   HGBA1C 6.7 (H) 09/15/2019 03:33 PM   MICROALBUR 10 09/15/2019 12:20 PM   MICROALBUR 10 03/03/2019 12:52 PM    Checking BG: 2x per Day Recent FBG Readings: 94,133,93,86,136,105,103,90,85,81,107,102,99,89 Recent pre-meal BG readings (around 4pm before Toujeo dose): [phone]  Patient has failed these meds in past: Lantus Patient is currently uncontrolled on the following medications:   Toujeo 20 units in the afternoon  Ozempic 0.5mg  weekly on Thursdays  We discussed  BG readings have improved on 20 units of Toujeo daily and Ozempic   Pt denies side effects from increased Ozempic dose (nausea, diarrhea, etc). Increased on 03/17/20  Pt has received Ozempic via the mail  Advised pt to try and limit carbohydrates and sugary drinks/snacks  Pt reports that she missed her Ozempic dose yesterday.   Advised pt to administer her Ozempic today and resume Thursday dosing next week  Pt complained of significant pain in her hands. Recommend she call Rheumatologist  Plan Continue current medications  Follow up: 2 weeks via phone  Jannette Fogo, PharmD Clinical Pharmacist [facility] [phone]

## 2020-04-06 NOTE — Patient Instructions (Addendum)
Visit Information  Goals Addressed            This Visit's Progress   . Diabetes Management       CARE PLAN ENTRY  Current Barriers:  . Diabetes: Uncontrolled; complicated by chronic medical conditions including hypertension, hyperlipidemia, CKD Lab Results  Component Value Date   HGBA1C 8.6 (H) 01/06/2020 .   Lab Results  Component Value Date   CREATININE 1.60 (H) 01/06/2020   CREATININE 1.80 (H) 11/26/2019   CREATININE 1.60 (H) 09/15/2019   . Current antihyperglycemic regimen: Toujeo 12 units daily . Current blood glucose readings:  o Recent FBG Readings: 94,133,93,86,136,105,103,90,85,81,107,102,99,89 o Recent pre-meal BG readings (around 4pm before Toujeo dose): [phone] Pharmacist Clinical Goal(s):  Marland Kitchen Over the next 14 days, patient will work with PharmD and primary care provider to control blood sugar.  Interventions: . Continue Toujeo 20 units daily and Ozempic 0.5mg  once weekly . Patient reported missing dose of Ozempic yesterday. Advised patient to administer Ozempic today and resume Thursday dosing next week. . Provided dietary and exercise recommendations  Patient Self Care Activities:  . Patient will check blood glucose twice daily, document, and provide to PharmD in 14 days . Patient will administer 20 units of Toujeo daily and Ozempic 0.5mg  once weekly . Patient will take medications as prescribed . Patient will contact provider with any episodes of hypoglycemia . Patient will report any questions or concerns to provider   Please see past updates related to this goal by clicking on the "Past Updates" button in the selected goal         Patient verbalizes understanding of instructions provided today.   Telephone follow up appointment with pharmacy team member scheduled for: 04/15/20  Jannette Fogo, PharmD Clinical Pharmacist [facility] [phone]   Diabetes Mellitus and  Nutrition, Adult When you have diabetes (diabetes mellitus), it is very important to have healthy eating habits because your blood sugar (glucose) levels are greatly affected by what you eat and drink. Eating healthy foods in the appropriate amounts, at about the same times every day, can help you:  Control your blood glucose.  Lower your risk of heart disease.  Improve your blood pressure.  Reach or maintain a healthy weight. Every person with diabetes is different, and each person has different needs for a meal plan. Your health care provider may recommend that you work with a diet and nutrition specialist (dietitian) to make a meal plan that is best for you. Your meal plan may vary depending on factors such as:  The calories you need.  The medicines you take.  Your weight.  Your blood glucose, blood pressure, and cholesterol levels.  Your activity level.  Other health conditions you have, such as heart or kidney disease. How do carbohydrates affect me? Carbohydrates, also called carbs, affect your blood glucose level more than any other type of food. Eating carbs naturally raises the amount of glucose in your blood. Carb counting is a method for keeping track of how many carbs you eat. Counting carbs is important to keep your blood glucose at a healthy level, especially if you use insulin or take certain oral diabetes medicines. It is important to know how many carbs you can safely have in each meal. This is different for every person. Your dietitian can help you calculate how many carbs you should have at each meal and for each snack. Foods that contain carbs include:  Bread, cereal, rice, pasta, and crackers.  Potatoes and corn.  Peas, beans, and lentils.  Milk and yogurt.  Fruit and juice.  Desserts, such as cakes, cookies, ice cream, and candy. How does alcohol affect me? Alcohol can cause a sudden decrease in blood glucose (hypoglycemia), especially if you use insulin  or take certain oral diabetes medicines. Hypoglycemia can be a life-threatening condition. Symptoms of hypoglycemia (sleepiness, dizziness, and confusion) are similar to symptoms of having too much alcohol. If your health care provider says that alcohol is safe for you, follow these guidelines:  Limit alcohol intake to no more than 1 drink per day for nonpregnant women and 2 drinks per day for men. One drink equals 12 oz of beer, 5 oz of wine, or 1 oz of hard liquor.  Do not drink on an empty stomach.  Keep yourself hydrated with water, diet soda, or unsweetened iced tea.  Keep in mind that regular soda, juice, and other mixers may contain a lot of sugar and must be counted as carbs. What are tips for following this plan?  Reading food labels  Start by checking the serving size on the "Nutrition Facts" label of packaged foods and drinks. The amount of calories, carbs, fats, and other nutrients listed on the label is based on one serving of the item. Many items contain more than one serving per package.  Check the total grams (g) of carbs in one serving. You can calculate the number of servings of carbs in one serving by dividing the total carbs by 15. For example, if a food has 30 g of total carbs, it would be equal to 2 servings of carbs.  Check the number of grams (g) of saturated and trans fats in one serving. Choose foods that have low or no amount of these fats.  Check the number of milligrams (mg) of salt (sodium) in one serving. Most people should limit total sodium intake to less than 2,300 mg per day.  Always check the nutrition information of foods labeled as "low-fat" or "nonfat". These foods may be higher in added sugar or refined carbs and should be avoided.  Talk to your dietitian to identify your daily goals for nutrients listed on the label. Shopping  Avoid buying canned, premade, or processed foods. These foods tend to be high in fat, sodium, and added sugar.  Shop  around the outside edge of the grocery store. This includes fresh fruits and vegetables, bulk grains, fresh meats, and fresh dairy. Cooking  Use low-heat cooking methods, such as baking, instead of high-heat cooking methods like deep frying.  Cook using healthy oils, such as olive, canola, or sunflower oil.  Avoid cooking with butter, cream, or high-fat meats. Meal planning  Eat meals and snacks regularly, preferably at the same times every day. Avoid going long periods of time without eating.  Eat foods high in fiber, such as fresh fruits, vegetables, beans, and whole grains. Talk to your dietitian about how many servings of carbs you can eat at each meal.  Eat 4-6 ounces (oz) of lean protein each day, such as lean meat, chicken, fish, eggs, or tofu. One oz of lean protein is equal to: ? 1 oz of meat, chicken, or fish. ? 1 egg. ?  cup of tofu.  Eat some foods each day that contain healthy fats, such as avocado, nuts, seeds, and fish. Lifestyle  Check your blood glucose regularly.  Exercise regularly as told by your health care provider. This may include: ? 150 minutes of moderate-intensity or vigorous-intensity exercise each week.  This could be brisk walking, biking, or water aerobics. ? Stretching and doing strength exercises, such as yoga or weightlifting, at least 2 times a week.  Take medicines as told by your health care provider.  Do not use any products that contain nicotine or tobacco, such as cigarettes and e-cigarettes. If you need help quitting, ask your health care provider.  Work with a Social worker or diabetes educator to identify strategies to manage stress and any emotional and social challenges. Questions to ask a health care provider  Do I need to meet with a diabetes educator?  Do I need to meet with a dietitian?  What number can I call if I have questions?  When are the best times to check my blood glucose? Where to find more information:  American  Diabetes Association: diabetes.org  Academy of Nutrition and Dietetics: www.eatright.[employer] of Diabetes and Digestive and Kidney Diseases (NIH): [employer] Summary  A healthy meal plan will help you control your blood glucose and maintain a healthy lifestyle.  Working with a diet and nutrition specialist (dietitian) can help you make a meal plan that is best for you.  Keep in mind that carbohydrates (carbs) and alcohol have immediate effects on your blood glucose levels. It is important to count carbs and to use alcohol carefully. This information is not intended to replace advice given to you by your health care provider. Make sure you discuss any questions you have with your health care provider. Document Revised: 08/09/2017 Document Reviewed: 10/01/2016 Elsevier Patient Education  2020 [employer].

## 2020-04-06 NOTE — Chronic Care Management (AMB) (Signed)
   Chronic Care Management Pharmacy  Name: Maria [name]  MRN: [id number] DOB: [birth year]  Chief Complaint/ HPI  [name],  86 y.o. , female presents for follow up phone call regarding elevated blood glucose and recent change of insulin from Lantus to [place].   PCP : Glendale Chard, MD  Their chronic conditions include: Hypertension, CHF, Diabetes, CKD, Hyperlipidemia and Gout  Current Diagnosis/Assessment: SDOH Interventions     Most Recent Value  SDOH Interventions  Financial Strain Interventions Intervention Not Indicated     Goals Addressed            This Visit's Progress   . Diabetes Management       CARE PLAN ENTRY  Current Barriers:  . Diabetes: Uncontrolled; complicated by chronic medical conditions including hypertension, hyperlipidemia, CKD Lab Results  Component Value Date   HGBA1C 8.6 (H) 01/06/2020 .   Lab Results  Component Value Date   CREATININE 1.60 (H) 01/06/2020   CREATININE 1.80 (H) 11/26/2019   CREATININE 1.60 (H) 09/15/2019   . Current antihyperglycemic regimen: Toujeo 12 units daily . Current blood glucose readings:  o Recent FBG Readings: 867,61,95,093,26,71,24,580,99,83,38,25 o Recent pre-meal BG readings (around 4pm before Toujeo dose): 152,135,130,144,108,123,175,116,250,102,170,133 Pharmacist Clinical Goal(s):  Marland Kitchen Over the next 14 days, patient will work with PharmD and primary care provider to control blood sugar.  Interventions: . Continue Toujeo 20 units daily and Ozempic 0.5mg  once weekly . Provided dietary and exercise recommendations  Patient Self Care Activities:  . Patient will check blood glucose twice daily, document, and provide to PharmD in 14 days . Patient will administer 20 units of Toujeo daily and Ozempic 0.5mg  once weekly . Patient will take medications as prescribed . Patient will contact provider with any episodes of hypoglycemia . Patient will report any questions or concerns to provider   Please see  past updates related to this goal by clicking on the "Past Updates" button in the selected goal         Diabetes   Recent Relevant Labs: Lab Results  Component Value Date/Time   HGBA1C 8.6 (H) 01/06/2020 12:03 PM   HGBA1C 6.7 (H) 09/15/2019 03:33 PM   MICROALBUR 10 09/15/2019 12:20 PM   MICROALBUR 10 03/03/2019 12:52 PM    Checking BG: 2x per Day Recent FBG Readings: 053,97,67,341,93,79,02,409,73,53,29,92 Recent pre-meal BG readings (around 4pm before Toujeo dose): 152,135,130,144,108,123,175,116,250,102,170,133  Patient has failed these meds in past: Lantus Patient is currently uncontrolled on the following medications:   Toujeo 20 units in the afternoon  Ozempic 0.5mg  weekly on Thursdays  We discussed  BG readings have improved on 20 units of Toujeo daily and Ozempic   Pt started increased Ozempic dose (0.5mg  weekly) on 7/8 after completing 4 weeks of Ozempic 0.25mg  weekly with no problems  Pt denies side effects from Ozempic (nausea, diarrhea, etc)  Ozempic PA has been approved and pt will receive through the mail for free  Pt mentioned that her grandchildren were here visiting from [place] and they ate at [employer] one day which was when she had her highest BG reading (250)  Advised pt to try and limit carbohydrates and sugary drinks/snacks  Plan Continue current medications  Follow up: 2 weeks via phone  Jannette Fogo, PharmD Clinical Pharmacist [facility] [phone]

## 2020-04-11 ENCOUNTER — Telehealth: Payer: Self-pay

## 2020-04-11 DIAGNOSIS — N1832 Chronic kidney disease, stage 3b: Secondary | ICD-10-CM | POA: Diagnosis not present

## 2020-04-11 DIAGNOSIS — D631 Anemia in chronic kidney disease: Secondary | ICD-10-CM | POA: Diagnosis not present

## 2020-04-11 DIAGNOSIS — I129 Hypertensive chronic kidney disease with stage 1 through stage 4 chronic kidney disease, or unspecified chronic kidney disease: Secondary | ICD-10-CM | POA: Diagnosis not present

## 2020-04-11 DIAGNOSIS — E559 Vitamin D deficiency, unspecified: Secondary | ICD-10-CM | POA: Diagnosis not present

## 2020-04-11 DIAGNOSIS — N189 Chronic kidney disease, unspecified: Secondary | ICD-10-CM | POA: Diagnosis not present

## 2020-04-11 DIAGNOSIS — I509 Heart failure, unspecified: Secondary | ICD-10-CM | POA: Diagnosis not present

## 2020-04-11 NOTE — Telephone Encounter (Signed)
  Chronic Care Management   Outreach Note  04/11/2020 Name: Maria [name] MRN: [id number] DOB: [birth year]  Referred by: Glendale Chard, MD Reason for referral : No chief complaint on file.   An unsuccessful telephone outreach was attempted today. The patient was referred to the case management team for assistance with care management and care coordination.   Follow Up Plan: Telephone follow up appointment with care management team member scheduled for: 05/13/20  Barb Merino, RN, BSN, CCM Care Management Coordinator [facility]  Direct Phone: [phone]

## 2020-04-12 ENCOUNTER — Other Ambulatory Visit: Payer: Self-pay | Admitting: Nephrology

## 2020-04-12 DIAGNOSIS — N1832 Chronic kidney disease, stage 3b: Secondary | ICD-10-CM

## 2020-04-15 ENCOUNTER — Other Ambulatory Visit: Payer: Self-pay

## 2020-04-15 ENCOUNTER — Ambulatory Visit (INDEPENDENT_AMBULATORY_CARE_PROVIDER_SITE_OTHER): Payer: Medicare Other

## 2020-04-15 DIAGNOSIS — E1122 Type 2 diabetes mellitus with diabetic chronic kidney disease: Secondary | ICD-10-CM | POA: Diagnosis not present

## 2020-04-15 DIAGNOSIS — Z794 Long term (current) use of insulin: Secondary | ICD-10-CM | POA: Diagnosis not present

## 2020-04-15 DIAGNOSIS — D631 Anemia in chronic kidney disease: Secondary | ICD-10-CM | POA: Diagnosis not present

## 2020-04-15 DIAGNOSIS — N183 Chronic kidney disease, stage 3 unspecified: Secondary | ICD-10-CM | POA: Diagnosis not present

## 2020-04-15 DIAGNOSIS — N189 Chronic kidney disease, unspecified: Secondary | ICD-10-CM | POA: Diagnosis not present

## 2020-04-15 DIAGNOSIS — N1832 Chronic kidney disease, stage 3b: Secondary | ICD-10-CM

## 2020-04-15 NOTE — Chronic Care Management (AMB) (Signed)
   Chronic Care Management Pharmacy  Name: Maria [name]  MRN: [id number] DOB: 04/04/1926  Chief Complaint/ HPI  Maria [name],  86 y.o. , female presented today for follow up over the phone with PharmD to review blood glucose readings.   PCP : Glendale Chard, MD  Their chronic conditions include: Hypertension, CHF, Diabetes, CKD, Hyperlipidemia and Gout  Current Diagnosis/Assessment:  Goals Addressed            This Visit's Progress   . Diabetes Management       CARE PLAN ENTRY  Current Barriers:  . Diabetes: Uncontrolled; complicated by chronic medical conditions including hypertension, hyperlipidemia, CKD Lab Results  Component Value Date   HGBA1C 8.1 (H) 03/07/2020 .   Lab Results  Component Value Date   CREATININE 1.52 (H) 01/27/2020   CREATININE 1.60 (H) 01/06/2020   CREATININE 1.80 (H) 11/26/2019   . Current antihyperglycemic regimen: Toujeo 17 units daily, Ozempic 0.5mg  weekly . Current blood glucose readings:  o Recent FBG Readings: 03,559,741,63,84,53,64,68,03 o Recent pre-meal BG readings (around 4pm before Toujeo dose): 212,248,25,00,370,48,889,169 Pharmacist Clinical Goal(s):  Marland Kitchen Over the next 30 days, patient will work with PharmD and primary care provider to control blood sugar.  Interventions: . Continue Ozempic 0.5mg  once weekly . Continue Toujeo to 17 units daily  . Provided dietary and exercise recommendations  Patient Self Care Activities:  . Patient will check blood glucose twice daily, document, and provide to PharmD in 14 days . Patient will administer 17 units of Toujeo daily and Ozempic 0.5mg  once weekly . Patient will take medications as prescribed . Patient will contact provider with any episodes of hypoglycemia . Patient will report any questions or concerns to provider   Please see past updates related to this goal by clicking on the "Past Updates" button in the selected goal        Diabetes   Recent Relevant Labs: Lab  Results  Component Value Date/Time   HGBA1C 8.1 (H) 03/07/2020 11:01 AM   HGBA1C 8.6 (H) 01/06/2020 12:03 PM   MICROALBUR 10 09/15/2019 12:20 PM   MICROALBUR 10 03/03/2019 12:52 PM    Checking BG: 2x per Day Recent FBG Readings: 45,038,882,80,03,49,17,91,50 Recent pre-meal BG readings (around 4pm before Toujeo dose): 569,794,80,16,553,74,827,078  Patient has failed these meds in past: Lantus Patient is currently uncontrolled on the following medications:   Toujeo 17 units in the afternoon  Ozempic 0.5mg  weekly on Thursdays  We discussed  Congratulated patient on almost all recent blood glucose readings being within goal  Pt states that she is feeling good  Today is patient's birthday and she has family visiting her!  Pt saw a nephrologist on Monday and is awaiting lab results. States that he wants her to have an MRI done  Pt also mentions an appointment for her hands next month  Advised pt to continue current insulin and Ozempic regimen  Plan Continue current medications  Follow up: 14 days via phone  Jannette Fogo, PharmD Clinical Pharmacist [facility] [phone]

## 2020-04-15 NOTE — Patient Instructions (Addendum)
Visit Information  Goals Addressed            This Visit's Progress   . Diabetes Management       CARE PLAN ENTRY  Current Barriers:  . Diabetes: Uncontrolled; complicated by chronic medical conditions including hypertension, hyperlipidemia, CKD Lab Results  Component Value Date   HGBA1C 8.1 (H) 03/07/2020 .   Lab Results  Component Value Date   CREATININE 1.52 (H) 01/27/2020   CREATININE 1.60 (H) 01/06/2020   CREATININE 1.80 (H) 11/26/2019   . Current antihyperglycemic regimen: Toujeo 17 units daily, Ozempic 0.5mg  weekly . Current blood glucose readings:  o Recent FBG Readings: 81,191,478,29,56,21,30,86,57 o Recent pre-meal BG readings (around 4pm before Toujeo dose): 846,962,95,28,413,24,401,027 Pharmacist Clinical Goal(s):  Marland Kitchen Over the next 30 days, patient will work with PharmD and primary care provider to control blood sugar.  Interventions: . Continue Ozempic 0.5mg  once weekly . Continue Toujeo to 17 units daily  . Provided dietary and exercise recommendations  Patient Self Care Activities:  . Patient will check blood glucose twice daily, document, and provide to PharmD in 14 days . Patient will administer 17 units of Toujeo daily and Ozempic 0.5mg  once weekly . Patient will take medications as prescribed . Patient will contact provider with any episodes of hypoglycemia . Patient will report any questions or concerns to provider   Please see past updates related to this goal by clicking on the "Past Updates" button in the selected goal         Patient verbalizes understanding of instructions provided today.   Telephone follow up appointment with pharmacy team member scheduled for: 04/29/20 @ 1:30 PM   Jannette Fogo, PharmD Clinical Pharmacist [facility] [phone]   Diabetes Mellitus and Nutrition, Adult When you have diabetes (diabetes mellitus), it is very important to have healthy eating habits because your blood sugar  (glucose) levels are greatly affected by what you eat and drink. Eating healthy foods in the appropriate amounts, at about the same times every day, can help you:  Control your blood glucose.  Lower your risk of heart disease.  Improve your blood pressure.  Reach or maintain a healthy weight. Every person with diabetes is different, and each person has different needs for a meal plan. Your health care provider may recommend that you work with a diet and nutrition specialist (dietitian) to make a meal plan that is best for you. Your meal plan may vary depending on factors such as:  The calories you need.  The medicines you take.  Your weight.  Your blood glucose, blood pressure, and cholesterol levels.  Your activity level.  Other health conditions you have, such as heart or kidney disease. How do carbohydrates affect me? Carbohydrates, also called carbs, affect your blood glucose level more than any other type of food. Eating carbs naturally raises the amount of glucose in your blood. Carb counting is a method for keeping track of how many carbs you eat. Counting carbs is important to keep your blood glucose at a healthy level, especially if you use insulin or take certain oral diabetes medicines. It is important to know how many carbs you can safely have in each meal. This is different for every person. Your dietitian can help you calculate how many carbs you should have at each meal and for each snack. Foods that contain carbs include:  Bread, cereal, rice, pasta, and crackers.  Potatoes and corn.  Peas, beans, and lentils.  Milk and yogurt.  Fruit and juice.  Desserts, such as cakes, cookies, ice cream, and candy. How does alcohol affect me? Alcohol can cause a sudden decrease in blood glucose (hypoglycemia), especially if you use insulin or take certain oral diabetes medicines. Hypoglycemia can be a life-threatening condition. Symptoms of hypoglycemia (sleepiness,  dizziness, and confusion) are similar to symptoms of having too much alcohol. If your health care provider says that alcohol is safe for you, follow these guidelines:  Limit alcohol intake to no more than 1 drink per day for nonpregnant women and 2 drinks per day for men. One drink equals 12 oz of beer, 5 oz of wine, or 1 oz of hard liquor.  Do not drink on an empty stomach.  Keep yourself hydrated with water, diet soda, or unsweetened iced tea.  Keep in mind that regular soda, juice, and other mixers may contain a lot of sugar and must be counted as carbs. What are tips for following this plan?  Reading food labels  Start by checking the serving size on the "Nutrition Facts" label of packaged foods and drinks. The amount of calories, carbs, fats, and other nutrients listed on the label is based on one serving of the item. Many items contain more than one serving per package.  Check the total grams (g) of carbs in one serving. You can calculate the number of servings of carbs in one serving by dividing the total carbs by 15. For example, if a food has 30 g of total carbs, it would be equal to 2 servings of carbs.  Check the number of grams (g) of saturated and trans fats in one serving. Choose foods that have low or no amount of these fats.  Check the number of milligrams (mg) of salt (sodium) in one serving. Most people should limit total sodium intake to less than 2,300 mg per day.  Always check the nutrition information of foods labeled as "low-fat" or "nonfat". These foods may be higher in added sugar or refined carbs and should be avoided.  Talk to your dietitian to identify your daily goals for nutrients listed on the label. Shopping  Avoid buying canned, premade, or processed foods. These foods tend to be high in fat, sodium, and added sugar.  Shop around the outside edge of the grocery store. This includes fresh fruits and vegetables, bulk grains, fresh meats, and fresh  dairy. Cooking  Use low-heat cooking methods, such as baking, instead of high-heat cooking methods like deep frying.  Cook using healthy oils, such as olive, canola, or sunflower oil.  Avoid cooking with butter, cream, or high-fat meats. Meal planning  Eat meals and snacks regularly, preferably at the same times every day. Avoid going long periods of time without eating.  Eat foods high in fiber, such as fresh fruits, vegetables, beans, and whole grains. Talk to your dietitian about how many servings of carbs you can eat at each meal.  Eat 4-6 ounces (oz) of lean protein each day, such as lean meat, chicken, fish, eggs, or tofu. One oz of lean protein is equal to: ? 1 oz of meat, chicken, or fish. ? 1 egg. ?  cup of tofu.  Eat some foods each day that contain healthy fats, such as avocado, nuts, seeds, and fish. Lifestyle  Check your blood glucose regularly.  Exercise regularly as told by your health care provider. This may include: ? 150 minutes of moderate-intensity or vigorous-intensity exercise each week. This could be brisk walking, biking, or water aerobics. ?  Stretching and doing strength exercises, such as yoga or weightlifting, at least 2 times a week.  Take medicines as told by your health care provider.  Do not use any products that contain nicotine or tobacco, such as cigarettes and e-cigarettes. If you need help quitting, ask your health care provider.  Work with a Social worker or diabetes educator to identify strategies to manage stress and any emotional and social challenges. Questions to ask a health care provider  Do I need to meet with a diabetes educator?  Do I need to meet with a dietitian?  What number can I call if I have questions?  When are the best times to check my blood glucose? Where to find more information:  American Diabetes Association: diabetes.org  Academy of Nutrition and Dietetics: www.eatright.[employer] of Diabetes and  Digestive and Kidney Diseases (NIH): [employer] Summary  A healthy meal plan will help you control your blood glucose and maintain a healthy lifestyle.  Working with a diet and nutrition specialist (dietitian) can help you make a meal plan that is best for you.  Keep in mind that carbohydrates (carbs) and alcohol have immediate effects on your blood glucose levels. It is important to count carbs and to use alcohol carefully. This information is not intended to replace advice given to you by your health care provider. Make sure you discuss any questions you have with your health care provider. Document Revised: 08/09/2017 Document Reviewed: 10/01/2016 Elsevier Patient Education  2020 [employer].

## 2020-04-25 ENCOUNTER — Ambulatory Visit
Admission: RE | Admit: 2020-04-25 | Discharge: 2020-04-25 | Disposition: A | Discharge status: Home / Self Care | Payer: Medicare Other | Source: Ambulatory Visit | Attending: Nephrology | Admitting: Nephrology

## 2020-04-25 DIAGNOSIS — N281 Cyst of kidney, acquired: Secondary | ICD-10-CM | POA: Diagnosis not present

## 2020-04-25 DIAGNOSIS — N1832 Chronic kidney disease, stage 3b: Secondary | ICD-10-CM

## 2020-04-25 DIAGNOSIS — N183 Chronic kidney disease, stage 3 unspecified: Secondary | ICD-10-CM | POA: Diagnosis not present

## 2020-04-26 ENCOUNTER — Telehealth: Payer: Self-pay

## 2020-04-26 MED ORDER — FERROUS SULFATE 325 (65 FE) MG PO TBEC
325.0000 mg | DELAYED_RELEASE_TABLET | Freq: Two times a day (BID) | ORAL | 3 refills | Status: AC
Start: 2020-04-26 — End: 2021-04-26

## 2020-04-26 NOTE — Telephone Encounter (Signed)
The pt called and left a message that she took a kidney test yesterday with urine and that she wanted Dr.  Baird Cancer to know.  I called the pt to see where she had the urine test done.

## 2020-04-26 NOTE — Telephone Encounter (Signed)
The pt called back and left a message that she got her kidney checked at [facility]. The pt said she wanted to let Dr. Baird Cancer know.

## 2020-04-26 NOTE — Telephone Encounter (Signed)
Pt given results of renal ultrasound

## 2020-04-29 ENCOUNTER — Ambulatory Visit: Payer: Self-pay

## 2020-04-29 ENCOUNTER — Other Ambulatory Visit: Payer: Self-pay

## 2020-04-29 DIAGNOSIS — D631 Anemia in chronic kidney disease: Secondary | ICD-10-CM

## 2020-04-29 DIAGNOSIS — N183 Chronic kidney disease, stage 3 unspecified: Secondary | ICD-10-CM | POA: Diagnosis not present

## 2020-04-29 DIAGNOSIS — Z794 Long term (current) use of insulin: Secondary | ICD-10-CM

## 2020-04-29 DIAGNOSIS — E1122 Type 2 diabetes mellitus with diabetic chronic kidney disease: Secondary | ICD-10-CM

## 2020-04-29 DIAGNOSIS — N189 Chronic kidney disease, unspecified: Secondary | ICD-10-CM

## 2020-04-29 NOTE — Patient Instructions (Addendum)
Visit Information  Goals Addressed            This Visit's Progress   . Diabetes Management       CARE PLAN ENTRY  Current Barriers:  . Diabetes: Uncontrolled; complicated by chronic medical conditions including hypertension, hyperlipidemia, CKD Lab Results  Component Value Date   HGBA1C 8.1 (H) 03/07/2020 .   Lab Results  Component Value Date   CREATININE 1.52 (H) 01/27/2020   CREATININE 1.60 (H) 01/06/2020   CREATININE 1.80 (H) 11/26/2019   . Current antihyperglycemic regimen: Toujeo 17 units daily, Ozempic 0.48m weekly . Current blood glucose readings:  o Recent FBG Readings: 98,86,108,83,114,81,87,95,84,126,88,100,97,88 o Recent pre-meal BG readings (around 4pm before Toujeo dose): 1979,480,165,53,74,827,078,675,449,20,100,712,197,588Pharmacist Clinical Goal(s):  .Marland KitchenOver the next 30 days, patient will work with PharmD and primary care provider to control blood sugar.  Interventions: . Reviewed blood glucose readings from past 2 weeks . Continue Ozempic 0.512monce weekly . Continue Toujeo to 17 units daily  . Provided dietary and exercise recommendations  Patient Self Care Activities:  . Patient will check blood glucose twice daily, document, and provide to PharmD in 14 days . Patient will administer 17 units of Toujeo daily and Ozempic 0.69m62mnce weekly . Patient will take medications as prescribed . Patient will contact provider with any episodes of hypoglycemia . Patient will report any questions or concerns to provider   Please see past updates related to this goal by clicking on the "Past Updates" button in the selected goal      . Management of Anemia       CARE PLAN ENTRY (see longitudinal plan of care for additional care plan information)  Current Barriers:  . Chronic Disease Management support, education, and care coordination needs related to  Anemia   Anemia . Pharmacist Clinical Goal(s) o Over the next 90 days, patient will work with PharmD and  providers to manage anemia  . Current regimen:  o Ferrous sulfate 3269m62mice daily . Interventions: o Patient is currently only taking ferrous sulfate once daily o Patient mentioned not feeling well and constipation since starting ferrous sulfate o Recommend patient increase docusate to 100mg56mce daily o Discussed there are alternative iron formulations available if patient cannot tolerate ferrous sulfate o Will provide further counseling at next follow up . Patient self care activities - Over the next 30 days, patient will: o Take docusate 100mg 369me daily o Continue to drink at least 64 ounces of water daily  Initial goal documentation        The patient verbalized understanding of instructions provided today and agreed to receive a mailed copy of patient instruction and/or educational materials.  Telephone follow up appointment with pharmacy team member scheduled for: 05/13/20 12 10:15 AM CourtnJannette FogomD Clinical Pharmacist [facility] [phone]  Anemia is a condition in which you do not have enough red blood cells or hemoglobin. Hemoglobin is a substance in red blood cells that carries oxygen. When you do not have enough red blood cells or hemoglobin (are anemic), your body cannot get enough oxygen and your organs may not work properly. As a result, you may feel very tired or have other problems. What are the causes? Common causes of anemia include:  Excessive bleeding. Anemia can be caused by excessive bleeding inside or outside the body, including bleeding from the intestine or from periods in women.  Poor nutrition.  Long-lasting (chronic) kidney, thyroid, and liver disease.  Bone marrow disorders.  Cancer and treatments for cancer.  HIV (human immunodeficiency virus) and AIDS (acquired immunodeficiency syndrome).  Treatments for HIV and AIDS.  Spleen problems.  Blood disorders.  Infections, medicines, and  autoimmune disorders that destroy red blood cells. What are the signs or symptoms? Symptoms of this condition include:  Minor weakness.  Dizziness.  Headache.  Feeling heartbeats that are irregular or faster than normal (palpitations).  Shortness of breath, especially with exercise.  Paleness.  Cold sensitivity.  Indigestion.  Nausea.  Difficulty sleeping.  Difficulty concentrating. Symptoms may occur suddenly or develop slowly. If your anemia is mild, you may not have symptoms. How is this diagnosed? This condition is diagnosed based on:  Blood tests.  Your medical history.  A physical exam.  Bone marrow biopsy. Your health care provider may also check your stool (feces) for blood and may do additional testing to look for the cause of your bleeding. You may also have other tests, including:  Imaging tests, such as a CT scan or MRI.  Endoscopy.  Colonoscopy. How is this treated? Treatment for this condition depends on the cause. If you continue to lose a lot of blood, you may need to be treated at a hospital. Treatment may include:  Taking supplements of iron, vitamin J33, or folic acid.  Taking a hormone medicine (erythropoietin) that can help to stimulate red blood cell growth.  Having a blood transfusion. This may be needed if you lose a lot of blood.  Making changes to your diet.  Having surgery to remove your spleen. Follow these instructions at home:  Take over-the-counter and prescription medicines only as told by your health care provider.  Take supplements only as told by your health care provider.  Follow any diet instructions that you were given.  Keep all follow-up visits as told by your health care provider. This is important. Contact a health care provider if:  You develop new bleeding anywhere in the body. Get help right away if:  You are very weak.  You are short of breath.  You have pain in your abdomen or chest.  You are  dizzy or feel faint.  You have trouble concentrating.  You have bloody or black, tarry stools.  You vomit repeatedly or you vomit up blood. Summary  Anemia is a condition in which you do not have enough red blood cells or enough of a substance in your red blood cells that carries oxygen (hemoglobin).  Symptoms may occur suddenly or develop slowly.  If your anemia is mild, you may not have symptoms.  This condition is diagnosed with blood tests as well as a medical history and physical exam. Other tests may be needed.  Treatment for this condition depends on the cause of the anemia. This information is not intended to replace advice given to you by your health care provider. Make sure you discuss any questions you have with your health care provider. Document Revised: 08/09/2017 Document Reviewed: 09/28/2016 Elsevier Patient Education  [place].

## 2020-04-29 NOTE — Chronic Care Management (AMB) (Signed)
Chronic Care Management Pharmacy  Name: Maria [name]  MRN: [id number] DOB: February 13, 1926  Chief Complaint/ HPI  Maria [name],  86 y.o. , female presents for their Follow-Up CCM visit with the clinical pharmacist via telephone to review recent blood sugar readings.  PCP : Glendale Chard, MD  Their chronic conditions include: Hypertension, CHF, Diabetes, CKD, Hyperlipidemia and Gout  Current Diagnosis/Assessment:  Goals Addressed            This Visit's Progress   . Diabetes Management       CARE PLAN ENTRY  Current Barriers:  . Diabetes: Uncontrolled; complicated by chronic medical conditions including hypertension, hyperlipidemia, CKD Lab Results  Component Value Date   HGBA1C 8.1 (H) 03/07/2020 .   Lab Results  Component Value Date   CREATININE 1.52 (H) 01/27/2020   CREATININE 1.60 (H) 01/06/2020   CREATININE 1.80 (H) 11/26/2019   . Current antihyperglycemic regimen: Toujeo 17 units daily, Ozempic 0.5mg  weekly . Current blood glucose readings:  o Recent FBG Readings: 98,86,108,83,114,81,87,95,84,126,88,100,97,88 o Recent pre-meal BG readings (around 4pm before Toujeo dose): 742,595,638,75,64,332,951,884,166,06,301,601,093,235 Pharmacist Clinical Goal(s):  Marland Kitchen Over the next 30 days, patient will work with PharmD and primary care provider to control blood sugar.  Interventions: . Reviewed blood glucose readings from past 2 weeks . Continue Ozempic 0.5mg  once weekly . Continue Toujeo to 17 units daily  . Provided dietary and exercise recommendations  Patient Self Care Activities:  . Patient will check blood glucose twice daily, document, and provide to PharmD in 14 days . Patient will administer 17 units of Toujeo daily and Ozempic 0.5mg  once weekly . Patient will take medications as prescribed . Patient will contact provider with any episodes of hypoglycemia . Patient will report any questions or concerns to provider   Please see past updates related to this  goal by clicking on the "Past Updates" button in the selected goal      . Management of Anemia       CARE PLAN ENTRY (see longitudinal plan of care for additional care plan information)  Current Barriers:  . Chronic Disease Management support, education, and care coordination needs related to  Anemia   Anemia . Pharmacist Clinical Goal(s) o Over the next 90 days, patient will work with PharmD and providers to manage anemia  . Current regimen:  o Ferrous sulfate 325mg  twice daily . Interventions: o Patient is currently only taking ferrous sulfate once daily o Patient mentioned not feeling well and constipation since starting ferrous sulfate o Recommend patient increase docusate to 100mg  twice daily o Discussed there are alternative iron formulations available if patient cannot tolerate ferrous sulfate o Will provide further counseling at next follow up . Patient self care activities - Over the next 30 days, patient will: o Take docusate 100mg  twice daily o Continue to drink at least 64 ounces of water daily  Initial goal documentation       Diabetes   Recent Relevant Labs: Lab Results  Component Value Date/Time   HGBA1C 8.1 (H) 03/07/2020 11:01 AM   HGBA1C 8.6 (H) 01/06/2020 12:03 PM   MICROALBUR 10 09/15/2019 12:20 PM   MICROALBUR 10 03/03/2019 12:52 PM    Checking BG: 2x per Day Recent FBG Readings: 98,86,108,83,114,81,87,95,84,126,88,100,97,88 Recent pre-meal BG readings (around 4pm before Toujeo dose): 573,220,254,27,06,237,628,315,176,16,073,710,626,948  Patient has failed these meds in past: Lantus Patient is currently uncontrolled on the following medications:   Toujeo 17 units in the afternoon  Ozempic 0.5mg  weekly on Thursdays  We  discussed  Reviewed BG readings for past 2 weeks  Congratulated pt for majority of readings being at goal  Pt states she has not had any incidences of low blood sugar since decreasing dose of Toujeo to 17 units  Advised pt to  continue current insulin and Ozempic dosing regimen  Plan Continue current medications   Anemia   Patient has failed these meds in past: N/A Patient is currently controlled on the following medications:   Ferrous sulfate 325mg  twice daily  We discussed:    Pt is only taking ferrous sulfate once daily right now  Complains today of not feeling well and constipation since starting iron tablets  Advised pt that nausea and constipation are common side effects of ferrous sulfate  Recommend pt increase docusate to 100mg  twice daily (currently taking 100mg  once daily)  Recommend pt drink plenty of water  Pt stats that she does  Pt is also drinking prune juice, advised pt that was okay  Discussed that there are alterative forms of iron that we can try if symptoms do not improve  Plan Continue current medications  Increase docusate to 100mg  twice daily  Follow up: CMA 7 days via phone, PharmD 14 days via phone  Jannette Fogo, PharmD Clinical Pharmacist [facility] [phone]

## 2020-05-03 ENCOUNTER — Telehealth: Payer: Self-pay

## 2020-05-03 NOTE — Telephone Encounter (Signed)
I returned the Maria [name]'s call and notified her that Dr. Baird Cancer didn't call her today it might've been an auto call telling the Maria [name] to make sure she gets whatever she needed taken care of done this year like eye exams, colonoscopy, vaccinations, etc.

## 2020-05-06 ENCOUNTER — Telehealth: Payer: Self-pay

## 2020-05-06 NOTE — Chronic Care Management (AMB) (Signed)
Chronic Care Management Pharmacy Assistant   Name: TIPHANIE VO  MRN: [id number] DOB: 1926-05-29  Reason for Encounter: Disease State / Diabetes Adherence Call  PCP : Glendale Chard, MD  Allergies:   Allergies  Allergen Reactions  . Ace Inhibitors Other (See Comments)    Angioedema   . Valsartan Other (See Comments)    Medications: Outpatient Encounter Medications as of 05/06/2020  Medication Sig  . Accu-Chek FastClix Lancets MISC CHECK BLOOD SUGAR BEFORE BREAKFAST AND DINNER  . ACCU-CHEK SMARTVIEW test strip TEST TWICE DAILY BEFORE BREAKFAST AND DINNER  . allopurinol (ZYLOPRIM) 100 MG tablet Take 2 tablets (200 mg total) by mouth daily.  Marland Kitchen amLODipine (NORVASC) 5 MG tablet TAKE ONE-HALF (1/2) TABLET DAILY  . aspirin 81 MG tablet Take 1 tablet (81 mg total) by mouth daily.  Marland Kitchen atorvastatin (LIPITOR) 40 MG tablet Take 1 tablet (40 mg total) by mouth daily.  . Cholecalciferol (VITAMIN D) 2000 UNITS tablet Take 2,000 Units by mouth daily.  . citalopram (CELEXA) 20 MG tablet Take 20 mg by mouth daily.   . diclofenac Sodium (VOLTAREN) 1 % GEL Apply 2 g topically 4 (four) times daily.  Marland Kitchen docusate sodium (COLACE) 100 MG capsule Take 100 mg by mouth daily.  Marland Kitchen donepezil (ARICEPT) 10 MG tablet TAKE 1 TABLET DAILY IN THE EVENING  . ezetimibe (ZETIA) 10 MG tablet TAKE 1 TABLET AT BEDTIME  . ferrous sulfate 325 (65 FE) MG EC tablet Take 1 tablet (325 mg total) by mouth 2 (two) times daily. (Patient taking differently: Take 325 mg by mouth daily. )  . FOLBIC 2.5-25-2 MG TABS tablet TAKE 1 TABLET DAILY  . furosemide (LASIX) 40 MG tablet TAKE 1 TABLET TWICE A DAY (NEED TO SCHEDULE AN APPOINTMENT)  . hydrALAZINE (APRESOLINE) 25 MG tablet TAKE 1 TABLET THREE TIMES A DAY  . hydroxychloroquine (PLAQUENIL) 200 MG tablet Take 1 tablet (200 mg total) by mouth daily.  . insulin glargine, 1 Unit Dial, (TOUJEO SOLOSTAR) 300 UNIT/ML Solostar Pen Inject 19 Units into the skin at bedtime.  . Insulin Pen  Needle (BD PEN NEEDLE MICRO U/F) 32G X 6 MM MISC Use as directed  . isosorbide mononitrate (IMDUR) 30 MG 24 hr tablet TAKE 1 TABLET DAILY  . loratadine (CLARITIN) 10 MG tablet Take 10 mg by mouth every morning.  . metolazone (ZAROXOLYN) 2.5 MG tablet TAKE 1 TABLET EVERY OTHER DAY 30 MINUTES PRIOR TO YOUR MORNING LASIX DOSE  . metoprolol succinate (TOPROL-XL) 25 MG 24 hr tablet TAKE 1 TABLET(25 MG) BY MOUTH DAILY  . potassium chloride (KLOR-CON) 10 MEQ tablet TAKE 1 TABLET TWICE A DAY  . predniSONE (DELTASONE) 2.5 MG tablet Take 3 tablets (7.5 mg total) by mouth daily with breakfast.  . Semaglutide,0.25 or 0.5MG /DOS, (OZEMPIC, 0.25 OR 0.5 MG/DOSE,) 2 MG/1.5ML SOPN Inject 0.375 mLs (0.5 mg total) into the skin once a week. Inject 0.5mg  into skin once weekly.  Marland Kitchen telmisartan (MICARDIS) 80 MG tablet TAKE 1 TABLET DAILY  . [DISCONTINUED] clonazePAM (KLONOPIN) 0.5 MG tablet Take 0.5 mg by mouth at bedtime.   No facility-administered encounter medications on file as of 05/06/2020.    Current Diagnosis: Patient Active Problem List   Diagnosis Date Noted  . Chronic renal disease, stage 3, moderately decreased glomerular filtration rate between 30-59 mL/min/1.73 square meter 01/08/2020  . Hypertensive heart and kidney disease with chronic diastolic congestive heart failure and stage 3 chronic kidney disease ([place]) 01/06/2020  . Primary osteoarthritis of right knee 01/06/2020  .  Joint pain 12/03/2019  . Hav (hallux abducto valgus), right 11/04/2019  . Diabetic peripheral vascular disease ([place]) 09/15/2019  . Gout 07/02/2019  . Insulin dependent type 2 diabetes mellitus ([place]) 07/02/2019  . Acute on chronic diastolic CHF (congestive heart failure) ([place]) 03/26/2018  . Acute on chronic renal insufficiency 03/26/2018  . Dyspnea on exertion 02/14/2018  . Palpitations 02/13/2017  . Chest pain 03/25/2015  . Acute diverticulitis 03/04/2013  . Bilateral lower extremity edema 01/23/2013  . Hyperlipidemia  01/23/2013  . Diverticulitis 01/31/2012  . Diabetes mellitus ([place]) 01/31/2012  . Essential hypertension 01/31/2012  . Obstructive sleep apnea on CPAP 01/31/2012  . History of TIAs 01/31/2012  . Obesity 01/31/2012  . H/O vertigo 01/31/2012   Recent Relevant Labs: Lab Results  Component Value Date/Time   HGBA1C 8.1 (H) 03/07/2020 11:01 AM   HGBA1C 8.6 (H) 01/06/2020 12:03 PM   MICROALBUR 10 09/15/2019 12:20 PM   MICROALBUR 10 03/03/2019 12:52 PM    Kidney Function Lab Results  Component Value Date/Time   CREATININE 1.52 (H) 01/27/2020 11:07 AM   CREATININE 1.60 (H) 01/06/2020 12:03 PM   CREATININE 1.80 (H) 11/26/2019 11:44 AM   GFRNONAA 30 (L) 01/27/2020 11:07 AM   GFRAA 34 (L) 01/27/2020 11:07 AM    . Current antihyperglycemic regimen:  o Toujeo 17 units daily o Ozempic 0.5 mg once weekly . What recent interventions/DTPs have been made to improve glycemic control:  o Per last CCM visit 04/29/20, Continue with current medications, diet and exercise recommendations, check blood sugars twice daily and document. . Have there been any recent hospitalizations or ED visits since last visit with CPP? No . Patient denies hypoglycemic symptoms, including Pale, Sweaty, Shaky, Hungry, Nervous/irritable and Vision changes . Patient denies hyperglycemic symptoms, including blurry vision, excessive thirst and polyuria . How often are you checking your blood sugar? twice daily . What are your blood sugars ranging?  o Fasting: 103, 95. 99 o Before meals: 105. 105 o After meals: none o Bedtime: none . During the week, how often does your blood glucose drop below 70? Never . Are you checking your feet daily/regularly? Yes-   Adherence Review: Is the patient currently on a STATIN medication? Yes- Atorvastatin 40 mg Is the patient currently on ACE/ARB medication? Yes- Telmisartan Does the patient have >5 day gap between last estimated fill dates? No   Follow-Up:  Pharmacist Review- Follow  up with constipation concerns discussed at [facility] Visit on 04/29/20.Patient states she is still feeling weak and still having constipation issues but is taking docusate 100 mg twice daily, patient feels like it will work but a slow process. She states she is drinking plenty of water and has added some prune juice. Patient also mentioned she has no appetite but still tries to eat something. Jannette Fogo, CPP notified.

## 2020-05-13 ENCOUNTER — Ambulatory Visit: Payer: Self-pay

## 2020-05-13 ENCOUNTER — Other Ambulatory Visit: Payer: Self-pay

## 2020-05-13 ENCOUNTER — Telehealth: Payer: Self-pay

## 2020-05-13 DIAGNOSIS — Z794 Long term (current) use of insulin: Secondary | ICD-10-CM

## 2020-05-13 DIAGNOSIS — E782 Mixed hyperlipidemia: Secondary | ICD-10-CM

## 2020-05-13 DIAGNOSIS — E1122 Type 2 diabetes mellitus with diabetic chronic kidney disease: Secondary | ICD-10-CM

## 2020-05-13 DIAGNOSIS — N183 Chronic kidney disease, stage 3 unspecified: Secondary | ICD-10-CM

## 2020-05-13 DIAGNOSIS — I5032 Chronic diastolic (congestive) heart failure: Secondary | ICD-10-CM

## 2020-05-13 DIAGNOSIS — I130 Hypertensive heart and chronic kidney disease with heart failure and stage 1 through stage 4 chronic kidney disease, or unspecified chronic kidney disease: Secondary | ICD-10-CM

## 2020-05-13 NOTE — Chronic Care Management (AMB) (Signed)
Chronic Care Management Pharmacy  Name: Maria [name]  MRN: [id number] DOB: 1926/02/19  Chief Complaint/ HPI  Maria [name],  86 y.o. , female presents for their Follow-Up CCM visit with the clinical pharmacist via telephone due to COVID-19 Pandemic. Patient mentions she is having trouble with her home phone today.   PCP : Glendale Chard, MD  Their chronic conditions include: Diabetes, Hypertension, Hyperlipidemia, Congestive heart failure, CKD, Gout, Seronegative rheumatoid arthritis   Office Visits: 04/26/20 Telephone call: Pt given results of renal ultrasound  03/16/20 Telephone call: Pt and pharmacy notified Ozempic approved on insurance. $0 copay.  03/07/20 Telephone call: Pt notified that Dr. Baird Cancer said okay to take atorvastatin. Pt has at home.   02/16/20 OV: Physical exam. Diabetic foot exam performed. EKG performed, NSR w/ RBBB no new changes noted. HgbA1c 8.1%, slightly down from 8.6. Blood count stable but on the low end. Awaiting renal consult for anemia. Pt does not wish to start oral iron supplements at this time.   01/06/20 AWV and OV: Diabetes and hypertension follow up. Labs ordered (Phosphorous, PTH intact w/ Ca, BMP8+EGFR, HgbA1c, Lipid panel). Home health referral for physical therapy. Encouraged to avoid salt. May benefit from intra-articular steroid injection for osteoarthritis. Encourage to decrease BMI to 29.   09/15/19 OV: Presented for diabetes and hypertension follow up. Nephrology referral discussed but pt declined at present. Labs ordered (UA, CMP14+EGFR, CBC no Diff, Phosphorous, Protein electrophoresis, HgbA1c). Keflex started every 12 hours for boil of buttock.   Consult Visit: 03/23/20 Podiatry OV w/ Dr. Prudence Davidson: Presented for at risk foot care. Mechanical debridement of nails 1-5. Return in 10 weeks.   02/15/20 Rheumatology procedure visit w/ T. Quita Skye: Synvisc injection in right knee  01/27/20 Rheumatology OV w/ T. Dale: Creatinine elevated and GFR low, but  improving. Referred to Nephrology by PCP. RBC/Hgb/Hct low, but stable. Anemia likely due to CKD. Uric acid within normal limits. No longer on allopurinol or colchicine due to kidney function. Pt tolerating Plaquenil 2554m well without side effects. Significant improvement in pain and symptoms on Plaquenil and prednisone (current dose Prednisone 7.566mdaily). Pt asked when she could begin tapering prednisone dose, she has had to increase insulin dose due to long term prednisone use. Current complaint includes right knee joint pain beginning 4 days ago. Has previously been treated with cortisone injections and Visco gel injections. Reported taking Aleve and Voltaren gel sparingly for pain. Start tapering prednisone dose to 54m70maily for 1 month then decrease by 1mg71mery month. Continue Plaquenil. Plan to administer Synvisc injection for knee pain once approved. Recommended pt avoid Aleve.   01/15/20 Podiatry OV w/ Dr. MayePrudence Davidsonesented for at risk foot care due to DM. Toenails are painful when walking and wearing shoes and she is unable to cut herself due to mobility. Debridement performed. Follow up in 10 weeks.   01/08/20 Cardiology Televisit w/ Dr. KilrRosalyn Gessllow up from 3/25 visit. Patient had problem w/ palpitations in Sept 2020 and was put on Toprol which improved symptoms. ZIO monitor was ordered but never used. No difference was perceived in pain with statin holiday so patient has resumed atorvastatin. In person follow up with Dr. BerrGwenlyn Found6 months.   12/21/19 Ophthalmology OV w/ Dr. GiegSusa Simmondsllow up s/p EDTA chelation OD 06/22/19, OS 02/23/19. Pt reported tearing in both eyes. Use artificial tears as needed.   12/04/19- Per rheumatology, due DEXA scan (refer to PCP), will taper prednisone when arthritis is better controlled  12/03/19  Cardiology Televisit w/ Dr. Rosalyn Gess: Elwyn Reach results still not available. Recommended holding Lipitor for 4 weeks for arthritis. Follow up in 4 weeks.  12/01/19- Colchicine  d/c after reviewing labs on 3/18, reduced allopurinol to 210m daily due to kidney function  11/26/19 Rheumatology OV: (Seronegative RA) Tolerated Plaquenil well with no side effects, has noticed improvement on prednisone and Plaquenil. Consider adding Arava at next follow up in 2 months  11/04/19 Podiatry OV: Debridement and preventative foot care, recommended soaks and soft shoes, follow up in 10 weeks  10/26/19 Rheumatology phone visit: Prednisone increased to 7.563mdaily  10/15/19 Rheumatology OV: Diagnosed with seronegative rheumatoid arthritis from lab work at previous OVOatmanPatient on prednisone 22m57maily, elevating BG, started on Plaquenil 200m28mtablet daily.  10/05/19 Rheumatology OV: Presented for evaluation of pain and swelling in both hands. Physical therapy has not improved symptoms. Recheck uric acid levels. Continue allopurinol and colchicine daily. Start prednisone 22mg 32mly. Follow up in 1 month. Labs ordered. Advised to monitor BG while on prednisone.   08/21/19 Podiatry OV: Presented for preventative foot care services. Debridement performed. Follow up in 10 weeks.   CCM Encounters: 01/20/20 RN: Current treatment plans for chronic conditions evaluated. Pt reported still taking allopurinol 300mg 66my and colchicine 0.6mg sp22mdically for gout.   08/13/19 SW: Reviewed progression of patient stated goals, including heart failure and weakness, and care coordination.  08/12/19 PharmD: Reviewed affordability of medications. Recommend dose reduction to allopurinol 200mg da57mand education on appropriate colchicine dosing.  Medications: Outpatient Encounter Medications as of 05/13/2020  Medication Sig  . Accu-Chek FastClix Lancets MISC CHECK BLOOD SUGAR BEFORE BREAKFAST AND DINNER  . ACCU-CHEK SMARTVIEW test strip TEST TWICE DAILY BEFORE BREAKFAST AND DINNER  . amLODipine (NORVASC) 5 MG tablet TAKE ONE-HALF (1/2) TABLET DAILY  . aspirin 81 MG tablet Take 1 tablet (81 mg total) by mouth  daily.  . atorvaMarland Kitchentatin (LIPITOR) 40 MG tablet Take 1 tablet (40 mg total) by mouth daily. (Patient taking differently: Take 40 mg by mouth daily. Monday, Wednesday, and Friday)  . Cholecalciferol (VITAMIN D) 2000 UNITS tablet Take 2,000 Units by mouth daily.  . citalopram (CELEXA) 20 MG tablet Take 20 mg by mouth daily.   . diclofenac Sodium (VOLTAREN) 1 % GEL Apply 2 g topically 4 (four) times daily.  . docusaMarland Kitchene sodium (COLACE) 100 MG capsule Take 100 mg by mouth 2 (two) times daily.   . donepeMarland Kitchenil (ARICEPT) 10 MG tablet TAKE 1 TABLET DAILY IN THE EVENING  . ezetimibe (ZETIA) 10 MG tablet TAKE 1 TABLET AT BEDTIME  . ferrous sulfate 325 (65 FE) MG EC tablet Take 1 tablet (325 mg total) by mouth 2 (two) times daily. (Patient taking differently: Take 325 mg by mouth daily. )  . FOLBIC 2.5-25-2 MG TABS tablet TAKE 1 TABLET DAILY  . furosemide (LASIX) 40 MG tablet TAKE 1 TABLET TWICE A DAY (NEED TO SCHEDULE AN APPOINTMENT)  . hydrALAZINE (APRESOLINE) 25 MG tablet TAKE 1 TABLET THREE TIMES A DAY  . hydroxychloroquine (PLAQUENIL) 200 MG tablet Take 1 tablet (200 mg total) by mouth daily.  . insulin glargine, 1 Unit Dial, (TOUJEO SOLOSTAR) 300 UNIT/ML Solostar Pen Inject 19 Units into the skin at bedtime. (Patient taking differently: Inject 17 Units into the skin at bedtime. )  . Insulin Pen Needle (BD PEN NEEDLE MICRO U/F) 32G X 6 MM MISC Use as directed  . isosorbide mononitrate (IMDUR) 30 MG 24 hr tablet TAKE 1 TABLET DAILY  . loratadine (  CLARITIN) 10 MG tablet Take 10 mg by mouth every morning.  . Magnesium 250 MG TABS Take 1 tablet by mouth at bedtime.  . metolazone (ZAROXOLYN) 2.5 MG tablet TAKE 1 TABLET EVERY OTHER DAY 30 MINUTES PRIOR TO YOUR MORNING LASIX DOSE  . metoprolol succinate (TOPROL-XL) 25 MG 24 hr tablet TAKE 1 TABLET(25 MG) BY MOUTH DAILY  . potassium chloride (KLOR-CON) 10 MEQ tablet TAKE 1 TABLET TWICE A DAY  . Probiotic Product (ALIGN) 4 MG CAPS Take 1 capsule by mouth daily.  .  Semaglutide,0.25 or 0.5MG/DOS, (OZEMPIC, 0.25 OR 0.5 MG/DOSE,) 2 MG/1.5ML SOPN Inject 0.375 mLs (0.5 mg total) into the skin once a week. Inject 0.57m into skin once weekly.  .Marland Kitchentelmisartan (MICARDIS) 80 MG tablet TAKE 1 TABLET DAILY  . allopurinol (ZYLOPRIM) 100 MG tablet Take 2 tablets (200 mg total) by mouth daily. (Patient not taking: Reported on 05/13/2020)  . predniSONE (DELTASONE) 2.5 MG tablet Take 3 tablets (7.5 mg total) by mouth daily with breakfast. (Patient not taking: Reported on 05/13/2020)  . [DISCONTINUED] clonazePAM (KLONOPIN) 0.5 MG tablet Take 0.5 mg by mouth at bedtime.   No facility-administered encounter medications on file as of 05/13/2020.     Current Diagnosis/Assessment:  SDOH Interventions     Most Recent Value  SDOH Interventions  Financial Strain Interventions Intervention Not Indicated      Goals Addressed    . Pharmacy Care Plan       CARE PLAN ENTRY (see longitudinal plan of care for additional care plan information)  Current Barriers:  . Chronic Disease Management support, education, and care coordination needs related to Hypertension, Hyperlipidemia, Diabetes, and Heart Failure   Hypertension BP Readings from Last 3 Encounters:  01/27/20 114/60  01/08/20 132/79  01/06/20 138/64   . Pharmacist Clinical Goal(s): o Over the next 90 days, patient will work with PharmD and providers to maintain BP goal <130/80 . Current regimen:  o Amlodipine 583m1/2 tablet daily o Telmisartan 8037maily . Interventions: o Dietary recommendations provided (limit salt intake) o Recommend patient check blood pressure more consistently . Patient self care activities - Over the next 90 days, patient will: o Check BP 1-2 times weekly and if symptomatic, document, and provide at future appointments o Ensure daily salt intake < 2300 mg/day  Hyperlipidemia Lab Results  Component Value Date/Time   LDLCALC 71 01/06/2020 12:03 PM   . Pharmacist Clinical Goal(s): o Over  the next 90 days, patient will work with PharmD and providers to maintain LDL goal < 70 . Current regimen:  o Atorvastatin 46m67mree times weekly o Ezetimibe 10mg70mly . Interventions: o Provided dietary and exercise recommendations o Discussed cholesterol levels are great . Patient self care activities - Over the next 90 days, patient will: o Take cholesterol medication as directed o Exercise as able  Diabetes Lab Results  Component Value Date/Time   HGBA1C 8.6 (H) 01/06/2020 12:03 PM   HGBA1C 6.7 (H) 09/15/2019 03:33 PM   . Pharmacist Clinical Goal(s): o Over the next 90 days, patient will work with PharmD and providers to achieve A1c goal <7% . Current regimen:  o Toujeo 17 units nightly o Ozempic 0.5mg o87m weekly . Interventions: o Discussed current diabetes treatment regimen . Patient self care activities - Over the next 90 days, patient will: o Check blood sugar twice daily, document, and provide at future appointments o Contact provider with any episodes of hypoglycemia o Continue Toujeo 17 units daily and Ozempic 0.5mg on51m  weekly  Heart Failure . Pharmacist Clinical Goal(s) o Over the next 90 days, patient will work with PharmD and providers to reduce symptoms associated with heart failure . Current regimen:   Furosemide 36m twice daily   Hydralazine 265mthree times daily  Isosorbide mononitrate ER 3032maily  Metolazone 2.5mg48mery other day prior to morning Lasix dose  Metoprolol Succinate XL 25mg87mly  Potassium 10mEq7mce daily . Interventions: o Encouraged patient to exercise as able . Patient self care activities - Over the next 90 days, patient will: o Weigh daily and record to check for fluid buildup o Notify PCP of significant shortness of breath  Medication management . Pharmacist Clinical Goal(s): o Over the next 90 days, patient will work with PharmD and providers to achieve optimal medication adherence . [facility] o Comprehensive medication review performed. o Continue current medication management strategy o Continue docusate twice daily as needed for constipation and Dulcolax as needed . Patient self care activities - Over the next 90 days, patient will: o Focus on medication adherence by continued use of pillbox and calendar reminders o Take medications as prescribed o Report any questions or concerns to PharmD and/or provider(s)  Please see past updates related to this goal by clicking on the "Past Updates" button in the selected goal         Diabetes   Recent Relevant Labs: Lab Results  Component Value Date/Time   HGBA1C 8.1 (H) 03/07/2020 11:01 AM   HGBA1C 8.6 (H) 01/06/2020 12:03 PM   MICROALBUR 10 09/15/2019 12:20 PM   MICROALBUR 10 03/03/2019 12:52 PM    Kidney Function Lab Results  Component Value Date/Time   CREATININE 1.52 (H) 01/27/2020 11:07 AM   CREATININE 1.60 (H) 01/06/2020 12:03 PM   CREATININE 1.80 (H) 11/26/2019 11:44 AM   GFRNONAA 30 (L) 01/27/2020 11:07 AM   GFRAA 34 (L) 01/27/2020 11:07 AM   Checking BG: 2x per Day  Recent FBG Readings: 88,93,40,10,272,536,64,40,347,425,95,638,756,43,32,95t pre-meal BG readings (4:30 pm): 120,96188,41,660,630,160,109,323,557,322,025,427,062,376,283t 2hr PP BG readings:  N/A Recent HS BG readings: N/A Patient has failed these meds in past: Lantus Patient is currently uncontrolled on the following medications:   Toujeo 17 units nightly   Ozempic 0.5mg we50my  Last diabetic Foot exam: 02/16/20 Last diabetic Eye exam: 01/12/20 Lab Results  Component Value Date/Time   HMDIABEYEEXA Retinopathy (A) 01/12/2020 12:00 AM    We discussed:  Pt is getting her appetite back some and feeling more like herself . Diet extensively o Pt is mostly drinking just water o Sometimes drinks fruit juices . One elevated BG reading recently (313); pt states that she had eaten some potato chips  that day . Discussed that almost all BG readings are within goal . Pt is doing great! Congratulated her on BG improvement! . No low BG reported  Plan Continue current medications   Heart Failure   Type: Diastolic  Last ejection fraction: >65% on 8/20 NYHA Class: III (marked limitation of activity) AHA HF Stage: B (Heart disease present - no symptoms present)  Patient has failed these meds in past: N/A Patient is currently controlled on the following medications:   Furosemide 40mg tw59mdaily   Hydralazine 25mg thr65mimes daily  Isosorbide mononitrate ER 30mg dail72metolazone 2.5mg every 22mer day prior to morning Lasix dose  Metoprolol Succinate XL 25mg daily 45massium 10mEq twice 47my  We discussed:  No problems noted  Plan  Continue current medications   Hypertension   Office blood pressures are  BP Readings from Last 3 Encounters:  03/07/20 128/66  01/27/20 114/60  01/08/20 132/79   Patient has failed these meds in the past: N/A Patient is currently controlled on the following medications:   Amlodipine 9m 1/2 tablet daily  Telmisartan 80 mg daily  Patient checks BP at home 1-2x per week, pt has not been checking consistently recently  Patient home BP readings are ranging: None to provide today  We discussed:  Appointment with Dr. BGwenlyn Foundon 9/13  Recommend pt check BP 1-2 times weekly  Plan Continue current medications   Hyperlipidemia   Lipid Panel     Component Value Date/Time   CHOL 165 01/06/2020 1203   TRIG 84 01/06/2020 1203   HDL 78 01/06/2020 1203   CHOLHDL 2.1 01/06/2020 1203   LCharlack71 01/06/2020 1203   LABVLDL 16 01/06/2020 1203     The ASCVD Risk score (Goff DC Jr., et al., 2013) failed to calculate for the following reasons:   The 2013 ASCVD risk score is only valid for ages 472to 740  Patient has failed these meds in past: N/A Patient is currently controlled on the following medications:   Atorvastatin 450m daily (Monday, Wednesday, and Friday)  Ezetimibe 1011mt bedtime  Aspirin 53m38mily  We discussed:    Denies any side effects at this time  Cholesterol is great  Plan Continue current medications  Gout  Uric acid 4.9 on 01/27/20  Patient has taken these meds in past: Allopurinol, colchicine Patient is currently controlled on the following medications:   N/A  We discussed:   No gout flares recently  Plan Continue control with diet and exercise   Depression   Patient has failed these meds in past: Lexapro, alprazolam Patient is currently controlled on the following medications:   Citalopram 20mg40mly  Plan Continue current medications  Alzheimer's   Patient has failed these meds in past: N/A Patient is currently controlled on the following medications:   Donepezil 10mg 62my  Plan Continue current medications  Seronegative Rheumatoid Arthritis   Patient has failed these meds in past: N/A Patient is currently controlled on the following medications:   Hydroxychloroquine 200mg d32m  We discussed:    Prednisone has been stopped completely  Pt says her hands are getting "tight" and stiff  She has follow up in October  Plan Continue current medications   Osteoarthritis   Patient has failed these meds in past: Aleve, ibuprofen Patient is currently uncontrolled on the following medications:   Diclofenac 1% gel 2 grams 4 times daily  We discussed:   Pt is using topically for her knee pain  Plan Continue current medications   Anemia   Patient has failed these meds in past: N/A Patient is currently uncontrolled on the following medications:   Ferrous sulfate 325mg tw41mdaily  We discussed:    Pt has mentioned constipation beginning after she started iron supplement  Pt is only taking iron once daily due to constipation  I advised to increase daily docusate to twice daily and then to three times daily but she has continued twice  daily  Pt has taken Dulcolax 5mg once22m/2) and it has helped her go to the bathroom  Discussed difference between Dulcolax (stimulant) and docusate (softener)  Advised pt it is okay to take Dulcolax as needed (would not recommend daily use)  Plan Continue current medications  Over the  Counter Medications   Patient is currently on the following medications:   Vitamin D 2000 units daily  Folbic 2.5/25/2 mg daily  Loratadine 21m daily  Magnesium 2539mat bedtime  Align probiotic capsule daily  Plan Continue current medications  Vaccines   Immunization History  Administered Date(s) Administered  . Influenza, High Dose Seasonal PF 06/25/2018, 06/03/2019  . PFIZER SARS-COV-2 Vaccination 11/24/2019, 12/15/2019  . Pneumococcal Conjugate-13 01/26/2015   We discussed:  Pneumovax, Shingrix, Tetanus  Pt had shingles and has had a shingles vaccine at "the center" (assuming it was Zostavax due to pt saying it was just one dose)  Discussed Shingrix vaccine being more effect than Zostavax, but that pt does have some immunity to shingles  Td booster recommended every 10 years  Pneumovax and Tdap can be done in office  Pt will get flu shot at PCP office visit in October  Plan Recommend pt receive Tdap and Pneumovax in office at appropriate intervals Shingrix would be beneficial, but not absolutely necessary due to pt having limited transportation and will have to receive at pharmacy  Medication Management   [facility] for all medications Uses pill box? Yes, pt also utilizes a calendar to track insulin and GLP-1 dosing Pt endorses 100% compliance  We discussed:  Importance of medication compliance   Plan Continue current medication management strategy  Follow up: 2 week phone visit  CoJannette FogoPharmD Clinical Pharmacist [facility] [phone]

## 2020-05-13 NOTE — Patient Instructions (Addendum)
Visit Information  Goals Addressed            This Visit's Progress   . Diabetes Management       CARE PLAN ENTRY  Current Barriers:  . Diabetes: Uncontrolled; complicated by chronic medical conditions including hypertension, hyperlipidemia, CKD Lab Results  Component Value Date   HGBA1C 8.1 (H) 03/07/2020 .   Lab Results  Component Value Date   CREATININE 1.52 (H) 01/27/2020   CREATININE 1.60 (H) 01/06/2020   CREATININE 1.80 (H) 11/26/2019   . Current antihyperglycemic regimen: Toujeo 17 units daily, Ozempic 0.5mg  weekly . Current blood glucose readings:  o Recent FBG Readings: 88,93,113,103,99,95,103,110,90,106,100,99,96,89 o Recent pre-meal BG readings (around 4pm before Toujeo dose): [phone] Pharmacist Clinical Goal(s):  Marland Kitchen Over the next 90 days, patient will work with PharmD and primary care provider to control blood sugar.  Interventions: . Reviewed blood glucose readings from past 2 weeks . Continue Ozempic 0.5mg  once weekly . Continue Toujeo to 17 units daily  . Provided dietary and exercise recommendations  Patient Self Care Activities:  . Patient will check blood glucose twice daily, document, and provide to PharmD in 14 days . Patient will administer 17 units of Toujeo daily and Ozempic 0.5mg  once weekly . Patient will take medications as prescribed . Patient will contact provider with any episodes of hypoglycemia . Patient will report any questions or concerns to provider   Please see past updates related to this goal by clicking on the "Past Updates" button in the selected goal      . [facility] (see longitudinal plan of care for additional care plan information)  Current Barriers:  . Chronic Disease Management support, education, and care coordination needs related to Hypertension, Hyperlipidemia, Diabetes, and Heart Failure   Hypertension BP Readings from Last 3 Encounters:   01/27/20 114/60  01/08/20 132/79  01/06/20 138/64   . Pharmacist Clinical Goal(s): o Over the next 90 days, patient will work with PharmD and providers to maintain BP goal <130/80 . Current regimen:  o Amlodipine 5mg  1/2 tablet daily o Telmisartan 80mg  daily . Interventions: o Dietary recommendations provided (limit salt intake) o Recommend patient check blood pressure more consistently . Patient self care activities - Over the next 90 days, patient will: o Check BP 1-2 times weekly and if symptomatic, document, and provide at future appointments o Ensure daily salt intake < 2300 mg/day  Hyperlipidemia Lab Results  Component Value Date/Time   LDLCALC 71 01/06/2020 12:03 PM   . Pharmacist Clinical Goal(s): o Over the next 90 days, patient will work with PharmD and providers to maintain LDL goal < 70 . Current regimen:  o Atorvastatin 40mg  three times weekly o Ezetimibe 10mg  daily . Interventions: o Provided dietary and exercise recommendations o Discussed cholesterol levels are great . Patient self care activities - Over the next 90 days, patient will: o Take cholesterol medication as directed o Exercise as able  Diabetes Lab Results  Component Value Date/Time   HGBA1C 8.6 (H) 01/06/2020 12:03 PM   HGBA1C 6.7 (H) 09/15/2019 03:33 PM   . Pharmacist Clinical Goal(s): o Over the next 90 days, patient will work with PharmD and providers to achieve A1c goal <7% . Current regimen:  o Toujeo 17 units nightly o Ozempic 0.5mg  once weekly . Interventions: o Discussed current diabetes treatment regimen . Patient self care activities - Over the next 90 days, patient will: o Check blood sugar twice  daily, document, and provide at future appointments o Contact provider with any episodes of hypoglycemia o Continue Toujeo 17 units daily and Ozempic 0.5mg  once weekly  Heart Failure . Pharmacist Clinical Goal(s) o Over the next 90 days, patient will work with PharmD and providers  to reduce symptoms associated with heart failure . Current regimen:   Furosemide 40mg  twice daily   Hydralazine 25mg  three times daily  Isosorbide mononitrate ER 30mg  daily  Metolazone 2.5mg  every other day prior to morning Lasix dose  Metoprolol Succinate XL 25mg  daily  Potassium 26mEq twice daily . Interventions: o Encouraged patient to exercise as able . Patient self care activities - Over the next 90 days, patient will: o Weigh daily and record to check for fluid buildup o Notify PCP of significant shortness of breath  Medication management . Pharmacist Clinical Goal(s): o Over the next 90 days, patient will work with PharmD and providers to achieve optimal medication adherence . Current pharmacy: [facility] . Interventions o Comprehensive medication review performed. o Continue current medication management strategy o Continue Docusate twice daily and Dulcolax as needed for constipation . Patient self care activities - Over the next 90 days, patient will: o Focus on medication adherence by continued use of pillbox and calendar reminders o Take medications as prescribed o Report any questions or concerns to PharmD and/or provider(s)  Please see past updates related to this goal by clicking on the "Past Updates" button in the selected goal         The patient verbalized understanding of instructions provided today and agreed to receive a mailed copy of patient instruction and/or educational materials.  Telephone follow up appointment with pharmacy team member scheduled for: 05/26/20 @ 4:45PM  Jannette Fogo, PharmD Clinical Pharmacist [facility] [phone]   Diabetes Mellitus and Nutrition, Adult When you have diabetes (diabetes mellitus), it is very important to have healthy eating habits because your blood sugar (glucose) levels are greatly affected by what you eat and drink. Eating healthy foods in the  appropriate amounts, at about the same times every day, can help you:  Control your blood glucose.  Lower your risk of heart disease.  Improve your blood pressure.  Reach or maintain a healthy weight. Every person with diabetes is different, and each person has different needs for a meal plan. Your health care provider may recommend that you work with a diet and nutrition specialist (dietitian) to make a meal plan that is best for you. Your meal plan may vary depending on factors such as:  The calories you need.  The medicines you take.  Your weight.  Your blood glucose, blood pressure, and cholesterol levels.  Your activity level.  Other health conditions you have, such as heart or kidney disease. How do carbohydrates affect me? Carbohydrates, also called carbs, affect your blood glucose level more than any other type of food. Eating carbs naturally raises the amount of glucose in your blood. Carb counting is a method for keeping track of how many carbs you eat. Counting carbs is important to keep your blood glucose at a healthy level, especially if you use insulin or take certain oral diabetes medicines. It is important to know how many carbs you can safely have in each meal. This is different for every person. Your dietitian can help you calculate how many carbs you should have at each meal and for each snack. Foods that contain carbs include:  Bread, cereal, rice, pasta,  and crackers.  Potatoes and corn.  Peas, beans, and lentils.  Milk and yogurt.  Fruit and juice.  Desserts, such as cakes, cookies, ice cream, and candy. How does alcohol affect me? Alcohol can cause a sudden decrease in blood glucose (hypoglycemia), especially if you use insulin or take certain oral diabetes medicines. Hypoglycemia can be a life-threatening condition. Symptoms of hypoglycemia (sleepiness, dizziness, and confusion) are similar to symptoms of having too much alcohol. If your health care  provider says that alcohol is safe for you, follow these guidelines:  Limit alcohol intake to no more than 1 drink per day for nonpregnant women and 2 drinks per day for men. One drink equals 12 oz of beer, 5 oz of wine, or 1 oz of hard liquor.  Do not drink on an empty stomach.  Keep yourself hydrated with water, diet soda, or unsweetened iced tea.  Keep in mind that regular soda, juice, and other mixers may contain a lot of sugar and must be counted as carbs. What are tips for following this plan?  Reading food labels  Start by checking the serving size on the "Nutrition Facts" label of packaged foods and drinks. The amount of calories, carbs, fats, and other nutrients listed on the label is based on one serving of the item. Many items contain more than one serving per package.  Check the total grams (g) of carbs in one serving. You can calculate the number of servings of carbs in one serving by dividing the total carbs by 15. For example, if a food has 30 g of total carbs, it would be equal to 2 servings of carbs.  Check the number of grams (g) of saturated and trans fats in one serving. Choose foods that have low or no amount of these fats.  Check the number of milligrams (mg) of salt (sodium) in one serving. Most people should limit total sodium intake to less than 2,300 mg per day.  Always check the nutrition information of foods labeled as "low-fat" or "nonfat". These foods may be higher in added sugar or refined carbs and should be avoided.  Talk to your dietitian to identify your daily goals for nutrients listed on the label. Shopping  Avoid buying canned, premade, or processed foods. These foods tend to be high in fat, sodium, and added sugar.  Shop around the outside edge of the grocery store. This includes fresh fruits and vegetables, bulk grains, fresh meats, and fresh dairy. Cooking  Use low-heat cooking methods, such as baking, instead of high-heat cooking methods like  deep frying.  Cook using healthy oils, such as olive, canola, or sunflower oil.  Avoid cooking with butter, cream, or high-fat meats. Meal planning  Eat meals and snacks regularly, preferably at the same times every day. Avoid going long periods of time without eating.  Eat foods high in fiber, such as fresh fruits, vegetables, beans, and whole grains. Talk to your dietitian about how many servings of carbs you can eat at each meal.  Eat 4-6 ounces (oz) of lean protein each day, such as lean meat, chicken, fish, eggs, or tofu. One oz of lean protein is equal to: ? 1 oz of meat, chicken, or fish. ? 1 egg. ?  cup of tofu.  Eat some foods each day that contain healthy fats, such as avocado, nuts, seeds, and fish. Lifestyle  Check your blood glucose regularly.  Exercise regularly as told by your health care provider. This may include: ? 150 minutes  of moderate-intensity or vigorous-intensity exercise each week. This could be brisk walking, biking, or water aerobics. ? Stretching and doing strength exercises, such as yoga or weightlifting, at least 2 times a week.  Take medicines as told by your health care provider.  Do not use any products that contain nicotine or tobacco, such as cigarettes and e-cigarettes. If you need help quitting, ask your health care provider.  Work with a Social worker or diabetes educator to identify strategies to manage stress and any emotional and social challenges. Questions to ask a health care provider  Do I need to meet with a diabetes educator?  Do I need to meet with a dietitian?  What number can I call if I have questions?  When are the best times to check my blood glucose? Where to find more information:  American Diabetes Association: diabetes.org  Academy of Nutrition and Dietetics: www.eatright.[employer] of Diabetes and Digestive and Kidney Diseases (NIH): [employer] Summary  A healthy meal plan will help you control  your blood glucose and maintain a healthy lifestyle.  Working with a diet and nutrition specialist (dietitian) can help you make a meal plan that is best for you.  Keep in mind that carbohydrates (carbs) and alcohol have immediate effects on your blood glucose levels. It is important to count carbs and to use alcohol carefully. This information is not intended to replace advice given to you by your health care provider. Make sure you discuss any questions you have with your health care provider. Document Revised: 08/09/2017 Document Reviewed: 10/01/2016 Elsevier Patient Education  2020 [employer].

## 2020-05-23 ENCOUNTER — Encounter: Payer: Self-pay | Admitting: Cardiovascular Disease

## 2020-05-23 ENCOUNTER — Other Ambulatory Visit: Payer: Self-pay

## 2020-05-23 ENCOUNTER — Ambulatory Visit (INDEPENDENT_AMBULATORY_CARE_PROVIDER_SITE_OTHER): Payer: Medicare Other | Admitting: Cardiovascular Disease

## 2020-05-23 DIAGNOSIS — I5033 Acute on chronic diastolic (congestive) heart failure: Secondary | ICD-10-CM

## 2020-05-23 DIAGNOSIS — E782 Mixed hyperlipidemia: Secondary | ICD-10-CM | POA: Diagnosis not present

## 2020-05-23 DIAGNOSIS — R600 Localized edema: Secondary | ICD-10-CM | POA: Diagnosis not present

## 2020-05-23 DIAGNOSIS — I10 Essential (primary) hypertension: Secondary | ICD-10-CM | POA: Diagnosis not present

## 2020-05-23 NOTE — Assessment & Plan Note (Signed)
History of chronic diastolic heart failure with normal LV systolic function by echo recently checked 04/19/2020 oral diuretics.

## 2020-05-23 NOTE — Assessment & Plan Note (Signed)
History of bilateral lower extreme edema probably related to diastolic dysfunction on oral diuretics.  She has 1-2+ left ankle edema today.

## 2020-05-23 NOTE — Assessment & Plan Note (Signed)
History of hyperlipidemia on statin therapy with lipid profile performed 12/29/2019 revealing total cholesterol 165, LDL 71 and HDL of 78.

## 2020-05-23 NOTE — Assessment & Plan Note (Signed)
History of essential hypertension with blood pressure measured today 120/62.  She is on amlodipine, hydralazine and metoprolol as well as Micardis.

## 2020-05-23 NOTE — Patient Instructions (Signed)
Medication Instructions:  °Your physician recommends that you continue on your current medications as directed. Please refer to the Current Medication list given to you today. ° °*If you need a refill on your cardiac medications before your next appointment, please call your pharmacy* ° °Lab Work: °NONE ordered at this time of appointment  ° °If you have labs (blood work) drawn today and your tests are completely normal, you will receive your results only by: °MyChart Message (if you have MyChart) OR °A paper copy in the mail °If you have any lab test that is abnormal or we need to change your treatment, we will call you to review the results. ° °Testing/Procedures: °NONE ordered at this time of appointment  ° °Follow-Up: °At [facility], you and your health needs are our priority.  As part of our continuing mission to provide you with exceptional heart care, we have created designated Provider Care Teams.  These Care Teams include your primary Cardiologist (physician) and Advanced Practice Providers (APPs -  Physician Assistants and Nurse Practitioners) who all work together to provide you with the care you need, when you need it. ° °Your next appointment:   °1 year(s) ° °The format for your next appointment:   °In Person ° °Provider:   °Jonathan Berry, MD   ° °Other Instructions ° ° °

## 2020-05-23 NOTE — Progress Notes (Signed)
05/23/2020 Maria [name]   1925-11-16  [id number]  Primary Physician Glendale Chard, MD Primary Cardiologist: Lorretta Harp MD FACP, [place], [place], [place]  HPI:  Maria [name] is a 86 y.o.  moderately overweight, widowed Caucasian female, mother of 2, grandmother to 2 grandchildren who I last saw virtually 06/04/2019... Her problems include hypertension, hyperlipidemia, diabetes and obstructive sleep apnea on CPAP. She has normal coronary arteries and normal LV function by cath performed/29/12. She does have diastolic dysfunction. When I saw her back in February she was complaining of dyspnea and I did begin her on Zaroxolyn twice a week which resulted in some improvement in her symptoms as well as in her lower extremity edema, though, today she clearly admits to dietary indiscretion with regards to salt. She was admitted to [facility] for 4 days last month because of diverticulitis. Since her last office visit she continues to have dietary indiscretion related to salt with significant lower extremity edema and shortness of breath as well as palpitations. She does complain of occasional episodes of substernal chest pain but she has normal coronary arteries by cath which I performed 11/08/2010. Her edema is improved today. She denies chest pain but continues to get some dyspnea on exertion. She walks with a walker.  She has chronic palpitations and had an event monitor performed a year ago that showed sinus rhythm with PVCs. Her last 2D echo [id number] normal LV systolic function, moderate concentric LVH with grade 1 diastolic dysfunction. She does complain of chronic dyspnea which I suspect is related to chronic diastolic heart failure. She also has chronic bilateral lower extremity edema and has admitted to dietary indiscretion with regards to salt in the past.  She is done fairly well except for newly diagnosed gout in both hands which is her major complaint. She is on  prednisone and colchicine, as well as allopurinol. She is also complained of increasing dyspnea on exertion for the last several months but denies chest pain. Her last 2D echo performed 02/25/2018 was essentially normal except for diastolic dysfunction. She is on Lasix and Zaroxolyn. She denies lower extremity edema in fact, since not going to restaurants, her salt intake is markedly been reduced as has her .  Since I saw her a year ago she has seen Kerin Ransom back virtually 01/08/2020.  She is developed rheumatoid arthritis and this has created her major symptoms.  She walks with a walker.  She gets occasional dyspnea patient also complains of some lower extremity edema.  Current Meds  Medication Sig  . Accu-Chek FastClix Lancets MISC CHECK BLOOD SUGAR BEFORE BREAKFAST AND DINNER  . ACCU-CHEK SMARTVIEW test strip TEST TWICE DAILY BEFORE BREAKFAST AND DINNER  . allopurinol (ZYLOPRIM) 100 MG tablet Take 2 tablets (200 mg total) by mouth daily.  Marland Kitchen amLODipine (NORVASC) 5 MG tablet TAKE ONE-HALF (1/2) TABLET DAILY  . aspirin 81 MG tablet Take 1 tablet (81 mg total) by mouth daily.  Marland Kitchen atorvastatin (LIPITOR) 40 MG tablet Take 1 tablet (40 mg total) by mouth daily. (Patient taking differently: Take 40 mg by mouth daily. Monday, Wednesday, and Friday)  . Cholecalciferol (VITAMIN D) 2000 UNITS tablet Take 2,000 Units by mouth daily.  . citalopram (CELEXA) 20 MG tablet Take 20 mg by mouth daily.   . diclofenac Sodium (VOLTAREN) 1 % GEL Apply 2 g topically 4 (four) times daily.  Marland Kitchen docusate sodium (COLACE) 100 MG capsule Take 100 mg by mouth 2 (two) times daily.   Marland Kitchen  donepezil (ARICEPT) 10 MG tablet TAKE 1 TABLET DAILY IN THE EVENING  . ezetimibe (ZETIA) 10 MG tablet TAKE 1 TABLET AT BEDTIME  . ferrous sulfate 325 (65 FE) MG EC tablet Take 1 tablet (325 mg total) by mouth 2 (two) times daily. (Patient taking differently: Take 325 mg by mouth daily. )  . FOLBIC 2.5-25-2 MG TABS tablet TAKE 1 TABLET DAILY    . furosemide (LASIX) 40 MG tablet TAKE 1 TABLET TWICE A DAY (NEED TO SCHEDULE AN APPOINTMENT)  . hydrALAZINE (APRESOLINE) 25 MG tablet TAKE 1 TABLET THREE TIMES A DAY  . hydroxychloroquine (PLAQUENIL) 200 MG tablet Take 1 tablet (200 mg total) by mouth daily.  . insulin glargine, 1 Unit Dial, (TOUJEO SOLOSTAR) 300 UNIT/ML Solostar Pen Inject 19 Units into the skin at bedtime. (Patient taking differently: Inject 17 Units into the skin at bedtime. )  . Insulin Pen Needle (BD PEN NEEDLE MICRO U/F) 32G X 6 MM MISC Use as directed  . isosorbide mononitrate (IMDUR) 30 MG 24 hr tablet TAKE 1 TABLET DAILY  . loratadine (CLARITIN) 10 MG tablet Take 10 mg by mouth every morning.  . Magnesium 250 MG TABS Take 1 tablet by mouth at bedtime.  . metolazone (ZAROXOLYN) 2.5 MG tablet TAKE 1 TABLET EVERY OTHER DAY 30 MINUTES PRIOR TO YOUR MORNING LASIX DOSE  . metoprolol succinate (TOPROL-XL) 25 MG 24 hr tablet TAKE 1 TABLET(25 MG) BY MOUTH DAILY  . potassium chloride (KLOR-CON) 10 MEQ tablet TAKE 1 TABLET TWICE A DAY  . predniSONE (DELTASONE) 2.5 MG tablet Take 3 tablets (7.5 mg total) by mouth daily with breakfast.  . Probiotic Product (ALIGN) 4 MG CAPS Take 1 capsule by mouth daily.  . Semaglutide,0.25 or 0.5MG /DOS, (OZEMPIC, 0.25 OR 0.5 MG/DOSE,) 2 MG/1.5ML SOPN Inject 0.375 mLs (0.5 mg total) into the skin once a week. Inject 0.5mg  into skin once weekly.  Marland Kitchen telmisartan (MICARDIS) 80 MG tablet TAKE 1 TABLET DAILY     Allergies  Allergen Reactions  . Ace Inhibitors Other (See Comments)    Angioedema   . Valsartan Other (See Comments)    Social History   Socioeconomic History  . Marital status: Widowed    Spouse name: Not on file  . Number of children: Not on file  . Years of education: Not on file  . Highest education level: Not on file  Occupational History  . Occupation: retired  Tobacco Use  . Smoking status: Never Smoker  . Smokeless tobacco: Never Used  Vaping Use  . Vaping Use: Never  used  Substance and Sexual Activity  . Alcohol use: No  . Drug use: No  . Sexual activity: Not Currently  Other Topics Concern  . Not on file  Social History Narrative   Has SCAT   Social Determinants of Health   Financial Resource Strain: Low Risk   . Difficulty of Paying Living Expenses: Not hard at all  Food Insecurity: No Food Insecurity  . Worried About Charity fundraiser in the Last Year: Never true  . Ran Out of Food in the Last Year: Never true  Transportation Needs: No Transportation Needs  . Lack of Transportation (Medical): No  . Lack of Transportation (Non-Medical): No  Physical Activity: Inactive  . Days of Exercise per Week: 0 days  . Minutes of Exercise per Session: 0 min  Stress: No Stress Concern Present  . Feeling of Stress : Not at all  Social Connections:   . Frequency of Communication  with Friends and Family: Not on file  . Frequency of Social Gatherings with Friends and Family: Not on file  . Attends Religious Services: Not on file  . Active Member of Clubs or Organizations: Not on file  . Attends Archivist Meetings: Not on file  . Marital Status: Not on file  Intimate Partner Violence:   . Fear of Current or Ex-Partner: Not on file  . Emotionally Abused: Not on file  . Physically Abused: Not on file  . Sexually Abused: Not on file     Review of Systems: General: negative for chills, fever, night sweats or weight changes.  Cardiovascular: negative for chest pain, dyspnea on exertion, edema, orthopnea, palpitations, paroxysmal nocturnal dyspnea or shortness of breath Dermatological: negative for rash Respiratory: negative for cough or wheezing Urologic: negative for hematuria Abdominal: negative for nausea, vomiting, diarrhea, bright red blood per rectum, melena, or hematemesis Neurologic: negative for visual changes, syncope, or dizziness All other systems reviewed and are otherwise negative except as noted above.    Blood pressure  120/62, pulse 85, height 4' 11.8" (1.519 m), weight 153 lb (69.4 kg).  General appearance: alert and no distress Neck: no adenopathy, no carotid bruit, no JVD, supple, symmetrical, trachea midline and thyroid not enlarged, symmetric, no tenderness/mass/nodules Lungs: clear to auscultation bilaterally Heart: regular rate and rhythm, S1, S2 normal, no murmur, click, rub or gallop Extremities: 1-2+ ankle edema Pulses: 2+ and symmetric Skin: Skin color, texture, turgor normal. No rashes or lesions Neurologic: Alert and oriented X 3, normal strength and tone. Normal symmetric reflexes. Normal coordination and gait  EKG not performed today  ASSESSMENT AND PLAN:   Essential hypertension History of essential hypertension with blood pressure measured today 120/62.  She is on amlodipine, hydralazine and metoprolol as well as Micardis.  Bilateral lower extremity edema History of bilateral lower extreme edema probably related to diastolic dysfunction on oral diuretics.  She has 1-2+ left ankle edema today.  Hyperlipidemia History of hyperlipidemia on statin therapy with lipid profile performed 12/29/2019 revealing total cholesterol 165, LDL 71 and HDL of 78.  Acute on chronic diastolic CHF (congestive heart failure) (HCC) History of chronic diastolic heart failure with normal LV systolic function by echo recently checked 04/19/2020 oral diuretics.      Lorretta Harp MD FACP,FACC,FAHA, [facility] 05/23/2020 11:21 AM

## 2020-05-24 NOTE — Progress Notes (Signed)
Office Visit Note  Patient: Maria [name]             Date of Birth: 04-Jun-1926           MRN: [id number]             PCP: Glendale Chard, MD Referring: Glendale Chard, MD Visit Date: 06/02/2020 Occupation: @GUAROCC @  Subjective:  Medication Management (questions about changes in medication)   History of Present Illness: Maria [name] is a 86 y.o. female with history of seronegative rheumatoid arthritis and gouty arthropathy.  She states she has been having increased pain and swelling in her joints.  She states she came off hydroxychloroquine when she discontinued prednisone few months back.  She is having difficulty doing activities due to discomfort in her hands.  Activities of Daily Living:  Patient reports morning stiffness for 0 minutes.   Patient Denies nocturnal pain.  Difficulty dressing/grooming: Denies Difficulty climbing stairs: Denies Difficulty getting out of chair: Reports Difficulty using hands for taps, buttons, cutlery, and/or writing: Reports  Review of Systems  Constitutional: Positive for appetite change and fatigue.  HENT: Negative for mouth sores, mouth dryness and nose dryness.   Eyes: Negative for pain, itching, visual disturbance and dryness.  Respiratory: Positive for shortness of breath. Negative for cough and difficulty breathing.        Pt recently saw cardiologist regarding SOB  Cardiovascular: Positive for swelling in legs/feet. Negative for chest pain and palpitations.  Gastrointestinal: Negative for abdominal pain, blood in stool, constipation and diarrhea.  Endocrine: Negative for increased urination.  Genitourinary: Negative for difficulty urinating and painful urination.  Musculoskeletal: Positive for arthralgias, joint pain and joint swelling. Negative for myalgias, muscle weakness, morning stiffness, muscle tenderness and myalgias.  Skin: Negative for color change, rash and redness.  Allergic/Immunologic: Negative for susceptible to  infections.  Neurological: Positive for numbness and weakness. Negative for dizziness, headaches and memory loss.  Hematological: Negative for bruising/bleeding tendency.  Psychiatric/Behavioral: Negative for confusion and sleep disturbance.    PMFS History:  Patient Active Problem List   Diagnosis Date Noted  . Seronegative rheumatoid arthritis ([place]) 06/02/2020  . Chronic renal disease, stage 3, moderately decreased glomerular filtration rate between 30-59 mL/min/1.73 square meter 01/08/2020  . Hypertensive heart and kidney disease with chronic diastolic congestive heart failure and stage 3 chronic kidney disease ([place]) 01/06/2020  . Primary osteoarthritis of right knee 01/06/2020  . Joint pain 12/03/2019  . Hav (hallux abducto valgus), right 11/04/2019  . Diabetic peripheral vascular disease ([place]) 09/15/2019  . Gout 07/02/2019  . Insulin dependent type 2 diabetes mellitus ([place]) 07/02/2019  . Acute on chronic diastolic CHF (congestive heart failure) ([place]) 03/26/2018  . Acute on chronic renal insufficiency 03/26/2018  . Dyspnea on exertion 02/14/2018  . Palpitations 02/13/2017  . Chest pain 03/25/2015  . Acute diverticulitis 03/04/2013  . Bilateral lower extremity edema 01/23/2013  . Hyperlipidemia 01/23/2013  . Diverticulitis 01/31/2012  . Diabetes mellitus ([place]) 01/31/2012  . Essential hypertension 01/31/2012  . Obstructive sleep apnea on CPAP 01/31/2012  . History of TIAs 01/31/2012  . Obesity 01/31/2012  . H/O vertigo 01/31/2012    Past Medical History:  Diagnosis Date  . Anxiety   . Arthritis   . Blood transfusion   . Depression   . Diabetes mellitus   . Edema   . Heart murmur   . Hyperlipidemia   . Hypertension   . Sleep apnea    cpap  . Stroke [facility])  3 x tias  . TIA (transient ischemic attack)     Family History  Problem Relation Age of Onset  . Healthy Mother   . Prostate cancer Father    Past Surgical History:  Procedure Laterality Date  . ABDOMINAL  HYSTERECTOMY    . APPENDECTOMY    . NM MYOVIEW LTD  [id number]   post stress left ventricle is normal in size, ejection fraction is 65%, normal myocardial perfusion study, low risk scan  . PARTIAL HYSTERECTOMY    . TRANSTHORACIC ECHOCARDIOGRAM  [id number]   Social History   Social History Narrative   Has SCAT   Immunization History  Administered Date(s) Administered  . Influenza, High Dose Seasonal PF 06/25/2018, 06/03/2019  . PFIZER SARS-COV-2 Vaccination 11/24/2019, 12/15/2019  . Pneumococcal Conjugate-13 01/26/2015     Objective: Vital Signs: BP 110/74 (BP Location: Left Arm, Patient Position: Sitting, Cuff Size: Small)   Pulse 96   Resp 20   Ht 4\' 11"  (1.499 m)   Wt 147 lb (66.7 kg)   BMI 29.69 kg/m    Physical Exam Vitals and nursing note reviewed.  Constitutional:      Appearance: She is well-developed.  HENT:     Head: Normocephalic and atraumatic.  Eyes:     Conjunctiva/sclera: Conjunctivae normal.  Cardiovascular:     Rate and Rhythm: Normal rate and regular rhythm.     Heart sounds: Normal heart sounds.  Pulmonary:     Effort: Pulmonary effort is normal.     Breath sounds: Normal breath sounds.  Abdominal:     General: Bowel sounds are normal.     Palpations: Abdomen is soft.  Musculoskeletal:     Cervical back: Normal range of motion.  Lymphadenopathy:     Cervical: No cervical adenopathy.  Skin:    General: Skin is warm and dry.     Capillary Refill: Capillary refill takes less than 2 seconds.  Neurological:     Mental Status: She is alert and oriented to person, place, and time.  Psychiatric:        Behavior: Behavior normal.      Musculoskeletal Exam: She has good range of motion of the cervical spine.  Shoulder joints and elbow joints with good range of motion.  She had synovitis over bilateral hands as described below.  She had good range of motion of her knee joints.  She had mild pedal edema.  Tenderness was noted over MTPs.  CDAI Exam: CDAI  Score: 17.2  Patient Global: 6 mm; Provider Global: 6 mm Swollen: 8 ; Tender: 8  Joint Exam 06/02/2020      Right  Left  Wrist  Swollen Tender  Swollen Tender  MCP 2  Swollen Tender  Swollen Tender  MCP 3  Swollen Tender  Swollen Tender  MCP 4  Swollen Tender  Swollen Tender     Investigation: No additional findings.  Imaging: No results found.  Recent Labs: Lab Results  Component Value Date   WBC 6.3 03/07/2020   HGB 9.5 (L) 03/07/2020   PLT 203 03/07/2020   NA 140 01/27/2020   K 4.3 01/27/2020   CL 100 01/27/2020   CO2 32 01/27/2020   GLUCOSE 306 (H) 01/27/2020   BUN 43 (H) 01/27/2020   CREATININE 1.52 (H) 01/27/2020   BILITOT 0.4 01/27/2020   ALKPHOS 108 09/15/2019   AST 18 01/27/2020   ALT 19 01/27/2020   PROT 5.8 (L) 01/27/2020   ALBUMIN 4.0 09/15/2019   CALCIUM 8.7 01/27/2020  GFRAA 34 (L) 01/27/2020    Speciality Comments: PLQ eye exam: 01/12/2020 & 01/25/2020 normal @ [facility]. Follow up in 1 year.  Procedures:  No procedures performed Allergies: Ace inhibitors and Valsartan   Assessment / Plan:     Visit Diagnoses: Seronegative rheumatoid arthritis (HCC)-she is having a flare with increased pain and swelling in her joints.  She is having difficulty making a fist.  She has synovitis over several MCPs as described above.  We had detailed discussion regarding her medications.  She was confused when she stopped her hydroxychloroquine.  Have advised her to resume hydroxychloroquine 200 mg p.o. daily.  I will also give her a prednisone taper as a bridging therapy.  Side effects of prednisone were discussed.  She has an swollen and follows the sliding scale for elevated blood sugar.  High risk medication use - Plaquenil 200 mg 1 tablet by mouth daily (Started in Feb 2021).  PLQ eye exam: 01/12/2020 & 01/25/2020 normal - Plan: CBC with Differential/Platelet, COMPLETE METABOLIC PANEL WITH GFR today.  Idiopathic chronic gout of multiple sites without  tophus -patient has not had a gout flare.  Her uric acid has been stable without medications.  Plan: Uric acid  Primary osteoarthritis of both hands-she continues to have some joints to  Primary osteoarthritis of both knees.-She is some knee joint discomfort.  Primary osteoarthritis of both feet-proper fitting shoes were discussed.  History of diabetes mellitus, type II-she is on insulin.  History of hyperlipidemia  History of diverticulitis  Obstructive sleep apnea on CPAP  Acute on chronic renal insufficiency  History of TIAs  Hypertensive heart disease with chronic diastolic congestive heart failure ([place])  Educated about COVID-19 virus infection-she had both Covid vaccines.  Have advised her to get booster.  Use of mask, social distancing and hand hygiene was discussed.  I also discussed that she will be candidate for monoclonal antibody infusion in case she gets COVID-19 infection.  Orders: Orders Placed This Encounter  Procedures  . CBC with Differential/Platelet  . COMPLETE METABOLIC PANEL WITH GFR  . Uric acid   Meds ordered this encounter  Medications  . predniSONE (DELTASONE) 5 MG tablet    Sig: Take 4 tabs po x 4 days, 3  tabs po x 4 days, 2  tabs po x 4 days, 1  tab po x 4 days    Dispense:  40 tablet    Refill:  0      Follow-Up Instructions: Return in about 3 months (around 09/01/2020) for Rheumatoid arthritis, Osteoarthritis.   Bo Merino, MD  Note - This record has been created using Editor, commissioning.  Chart creation errors have been sought, but may not always  have been located. Such creation errors do not reflect on  the standard of medical care.

## 2020-05-26 ENCOUNTER — Other Ambulatory Visit: Payer: Self-pay

## 2020-05-26 ENCOUNTER — Ambulatory Visit: Payer: Self-pay

## 2020-05-26 DIAGNOSIS — E1122 Type 2 diabetes mellitus with diabetic chronic kidney disease: Secondary | ICD-10-CM

## 2020-05-26 DIAGNOSIS — Z794 Long term (current) use of insulin: Secondary | ICD-10-CM

## 2020-05-26 DIAGNOSIS — E782 Mixed hyperlipidemia: Secondary | ICD-10-CM

## 2020-05-26 NOTE — Patient Instructions (Addendum)
Visit Information  Goals Addressed            This Visit's Progress   . Diabetes Management       CARE PLAN ENTRY  Current Barriers:  . Diabetes: Uncontrolled; complicated by chronic medical conditions including hypertension, hyperlipidemia, CKD Lab Results  Component Value Date   HGBA1C 8.1 (H) 03/07/2020 .   Lab Results  Component Value Date   CREATININE 1.52 (H) 01/27/2020   CREATININE 1.60 (H) 01/06/2020   CREATININE 1.80 (H) 11/26/2019   . Current antihyperglycemic regimen: Toujeo 17 units daily, Ozempic 0.5mg  weekly . Current blood glucose readings:  o Recent FBG Readings: 92,91,114,109,89,94,100,87,88,114,86,93,100 o Recent pre-meal BG readings (around 4pm before Toujeo dose): [phone] Pharmacist Clinical Goal(s):  Marland Kitchen Over the next 90 days, patient will work with PharmD and primary care provider to control blood sugar.  Interventions: . Reviewed blood glucose readings from past 2 weeks . Continue Ozempic 0.5mg  once weekly . Continue Toujeo to 17 units daily  . Provided dietary and exercise recommendations  Patient Self Care Activities:  . Patient will check blood glucose twice daily, document, and provide to PharmD in 21 days . Patient will administer 17 units of Toujeo daily and Ozempic 0.5mg  once weekly . Patient will take medications as prescribed . Patient will contact provider with any episodes of hypoglycemia . Patient will report any questions or concerns to provider   Please see past updates related to this goal by clicking on the "Past Updates" button in the selected goal         The patient verbalized understanding of instructions provided today and agreed to receive a mailed copy of patient instruction and/or educational materials.  Telephone follow up appointment with pharmacy team member scheduled for: 06/17/20 @ 2:00 PM  Jannette Fogo, PharmD Clinical Pharmacist [facility] [phone]  Semaglutide injection solution What is this medicine? SEMAGLUTIDE (Sem a GLOO tide) is used to improve blood sugar control in adults with type 2 diabetes. This medicine may be used with other diabetes medicines. This drug may also reduce the risk of heart attack or stroke if you have type 2 diabetes and risk factors for heart disease. This medicine may be used for other purposes; ask your health care provider or pharmacist if you have questions. COMMON BRAND NAME(S): OZEMPIC What should I tell my health care provider before I take this medicine? They need to know if you have any of these conditions:  endocrine tumors (MEN 2) or if someone in your family had these tumors  eye disease, vision problems  history of pancreatitis  kidney disease  stomach problems  thyroid cancer or if someone in your family had thyroid cancer  an unusual or allergic reaction to semaglutide, other medicines, foods, dyes, or preservatives  pregnant or trying to get pregnant  breast-feeding How should I use this medicine? This medicine is for injection under the skin of your upper leg (thigh), stomach area, or upper arm. It is given once every week (every 7 days). You will be taught how to prepare and give this medicine. Use exactly as directed. Take your medicine at regular intervals. Do not take it more often than directed. If you use this medicine with insulin, you should inject this medicine and the insulin separately. Do not mix them together. Do not give the injections right next to each other. Change (rotate) injection sites with each injection. It is important that you put your used needles and syringes in a  special sharps container. Do not put them in a trash can. If you do not have a sharps container, call your pharmacist or healthcare provider to get one. A special MedGuide will be given to you by the pharmacist with each prescription and refill. Be sure to read this  information carefully each time. This drug comes with INSTRUCTIONS FOR USE. Ask your pharmacist for directions on how to use this drug. Read the information carefully. Talk to your pharmacist or health care provider if you have questions. Talk to your pediatrician regarding the use of this medicine in children. Special care may be needed. Overdosage: If you think you have taken too much of this medicine contact a poison control center or emergency room at once. NOTE: This medicine is only for you. Do not share this medicine with others. What if I miss a dose? If you miss a dose, take it as soon as you can within 5 days after the missed dose. Then take your next dose at your regular weekly time. If it has been longer than 5 days after the missed dose, do not take the missed dose. Take the next dose at your regular time. Do not take double or extra doses. If you have questions about a missed dose, contact your health care provider for advice. What may interact with this medicine?  other medicines for diabetes Many medications may cause changes in blood sugar, these include:  alcohol containing beverages  antiviral medicines for HIV or AIDS  aspirin and aspirin-like drugs  certain medicines for blood pressure, heart disease, irregular heart beat  chromium  diuretics  female hormones, such as estrogens or progestins, birth control pills  fenofibrate  gemfibrozil  isoniazid  lanreotide  female hormones or anabolic steroids  MAOIs like Carbex, Eldepryl, Marplan, Nardil, and Parnate  medicines for weight loss  medicines for allergies, asthma, cold, or cough  medicines for depression, anxiety, or psychotic disturbances  niacin  nicotine  NSAIDs, medicines for pain and inflammation, like ibuprofen or naproxen  octreotide  pasireotide  pentamidine  phenytoin  probenecid  quinolone antibiotics such as ciprofloxacin, levofloxacin, ofloxacin  some herbal dietary  supplements  steroid medicines such as prednisone or cortisone  sulfamethoxazole; trimethoprim  thyroid hormones Some medications can hide the warning symptoms of low blood sugar (hypoglycemia). You may need to monitor your blood sugar more closely if you are taking one of these medications. These include:  beta-blockers, often used for high blood pressure or heart problems (examples include atenolol, metoprolol, propranolol)  clonidine  guanethidine  reserpine This list may not describe all possible interactions. Give your health care provider a list of all the medicines, herbs, non-prescription drugs, or dietary supplements you use. Also tell them if you smoke, drink alcohol, or use illegal drugs. Some items may interact with your medicine. What should I watch for while using this medicine? Visit your doctor or health care professional for regular checks on your progress. Drink plenty of fluids while taking this medicine. Check with your doctor or health care professional if you get an attack of severe diarrhea, nausea, and vomiting. The loss of too much body fluid can make it dangerous for you to take this medicine. A test called the HbA1C (A1C) will be monitored. This is a simple blood test. It measures your blood sugar control over the last 2 to 3 months. You will receive this test every 3 to 6 months. Learn how to check your blood sugar. Learn the symptoms of low  and high blood sugar and how to manage them. Always carry a quick-source of sugar with you in case you have symptoms of low blood sugar. Examples include hard sugar candy or glucose tablets. Make sure others know that you can choke if you eat or drink when you develop serious symptoms of low blood sugar, such as seizures or unconsciousness. They must get medical help at once. Tell your doctor or health care professional if you have high blood sugar. You might need to change the dose of your medicine. If you are sick or  exercising more than usual, you might need to change the dose of your medicine. Do not skip meals. Ask your doctor or health care professional if you should avoid alcohol. Many nonprescription cough and cold products contain sugar or alcohol. These can affect blood sugar. Pens should never be shared. Even if the needle is changed, sharing may result in passing of viruses like hepatitis or HIV. Wear a medical ID bracelet or chain, and carry a card that describes your disease and details of your medicine and dosage times. Do not become pregnant while taking this medicine. Women should inform their doctor if they wish to become pregnant or think they might be pregnant. There is a potential for serious side effects to an unborn child. Talk to your health care professional or pharmacist for more information. What side effects may I notice from receiving this medicine? Side effects that you should report to your doctor or health care professional as soon as possible:  allergic reactions like skin rash, itching or hives, swelling of the face, lips, or tongue  breathing problems  changes in vision  diarrhea that continues or is severe  lump or swelling on the neck  severe nausea  signs and symptoms of infection like fever or chills; cough; sore throat; pain or trouble passing urine  signs and symptoms of low blood sugar such as feeling anxious, confusion, dizziness, increased hunger, unusually weak or tired, sweating, shakiness, cold, irritable, headache, blurred vision, fast heartbeat, loss of consciousness  signs and symptoms of kidney injury like trouble passing urine or change in the amount of urine  trouble swallowing  unusual stomach upset or pain  vomiting Side effects that usually do not require medical attention (report to your doctor or health care professional if they continue or are bothersome):  constipation  diarrhea  nausea  pain, redness, or irritation at site where  injected  stomach upset This list may not describe all possible side effects. Call your doctor for medical advice about side effects. You may report side effects to FDA at 1-800-FDA-1088. Where should I keep my medicine? Keep out of the reach of children. Store unopened pens in a refrigerator between 2 and 8 degrees C (36 and 46 degrees F). Do not freeze. Protect from light and heat. After you first use the pen, it can be stored for 56 days at room temperature between 15 and 30 degrees C (59 and 86 degrees F) or in a refrigerator. Throw away your used pen after 56 days or after the expiration date, whichever comes first. Do not store your pen with the needle attached. If the needle is left on, medicine may leak from the pen. NOTE: This sheet is a summary. It may not cover all possible information. If you have questions about this medicine, talk to your doctor, pharmacist, or health care provider.  2020 Elsevier/Gold Standard (2019-05-12 09:41:51)

## 2020-05-26 NOTE — Chronic Care Management (AMB) (Signed)
Chronic Care Management Pharmacy  Name: Maria [name]  MRN: [id number] DOB: 1925-10-29  Chief Complaint/ HPI  Maria [name],  86 y.o. , female presents for their Follow-Up CCM visit with the clinical pharmacist via telephone to review recent blood sugar readings. Pt mentions she is having problems with her hands (pain and tightness) and is going to see the rheumatologist and neurologist within the next 2 weeks.   PCP : Maria Chard, MD  Their chronic conditions include: Hypertension, CHF, Diabetes, CKD, Hyperlipidemia and Gout  Current Diagnosis/Assessment:  Goals Addressed            This Visit's Progress   . Diabetes Management       CARE PLAN ENTRY  Current Barriers:  . Diabetes: Uncontrolled; complicated by chronic medical conditions including hypertension, hyperlipidemia, CKD Lab Results  Component Value Date   HGBA1C 8.1 (H) 03/07/2020 .   Lab Results  Component Value Date   CREATININE 1.52 (H) 01/27/2020   CREATININE 1.60 (H) 01/06/2020   CREATININE 1.80 (H) 11/26/2019   . Current antihyperglycemic regimen: Toujeo 17 units daily, Ozempic 0.5mg  weekly . Current blood glucose readings:  o Recent FBG Readings: 92,91,114,109,89,94,100,87,88,114,86,93,100 o Recent pre-meal BG readings (around 4pm before Toujeo dose): [phone] Pharmacist Clinical Goal(s):  Marland Kitchen Over the next 90 days, patient will work with PharmD and primary care provider to control blood sugar.  Interventions: . Reviewed blood glucose readings from past 2 weeks . Continue Ozempic 0.5mg  once weekly . Continue Toujeo to 17 units daily  . Provided dietary and exercise recommendations  Patient Self Care Activities:  . Patient will check blood glucose twice daily, document, and provide to PharmD in 21 days . Patient will administer 17 units of Toujeo daily and Ozempic 0.5mg  once weekly . Patient will take medications as prescribed . Patient will  contact provider with any episodes of hypoglycemia . Patient will report any questions or concerns to provider   Please see past updates related to this goal by clicking on the "Past Updates" button in the selected goal        Diabetes   Recent Relevant Labs: Lab Results  Component Value Date/Time   HGBA1C 8.1 (H) 03/07/2020 11:01 AM   HGBA1C 8.6 (H) 01/06/2020 12:03 PM   MICROALBUR 10 09/15/2019 12:20 PM   MICROALBUR 10 03/03/2019 12:52 PM    Checking BG: 2x per Day Recent FBG Readings: 92,91,114,109,89,94,100,87,88,114,86,93,100 Recent pre-meal BG readings (around 4pm before Toujeo dose): [phone]  Patient has failed these meds in past: Lantus Patient is currently uncontrolled on the following medications:   Toujeo 17 units in the afternoon  Ozempic 0.5mg  weekly on Thursdays  We discussed  Reviewed BG readings for past 2 weeks  Congratulated pt for majority of readings being at goal  Pt denies incidences of low blood sugar   Advised pt to continue current insulin and Ozempic dosing regimen  Pt mentions that she does not have much of an appetite  Advised pt this can be a side effect of Ozempic  Advised pt to eat small meals and stop eating when full   Plan Continue current medications   Anemia   Patient has failed these meds in past: N/A Patient is currently controlled on the following medications:   Ferrous sulfate 325mg  twice daily  We discussed:    Pt is only taking ferrous sulfate once daily right now  Pt states that constipation has been slowly getting better  Plan Continue current medications   Follow  up: 3 week phone visit  Jannette Fogo, PharmD Clinical Pharmacist [facility] [phone]

## 2020-06-02 ENCOUNTER — Ambulatory Visit: Payer: Medicare Other | Admitting: Rheumatology

## 2020-06-02 ENCOUNTER — Other Ambulatory Visit: Payer: Self-pay

## 2020-06-02 ENCOUNTER — Encounter: Payer: Self-pay | Admitting: Rheumatology

## 2020-06-02 VITALS — BP 110/74 | HR 96 | Resp 20 | Ht 59.0 in | Wt 147.0 lb

## 2020-06-02 DIAGNOSIS — M19071 Primary osteoarthritis, right ankle and foot: Secondary | ICD-10-CM

## 2020-06-02 DIAGNOSIS — M19072 Primary osteoarthritis, left ankle and foot: Secondary | ICD-10-CM

## 2020-06-02 DIAGNOSIS — M0600 Rheumatoid arthritis without rheumatoid factor, unspecified site: Secondary | ICD-10-CM | POA: Diagnosis not present

## 2020-06-02 DIAGNOSIS — Z79899 Other long term (current) drug therapy: Secondary | ICD-10-CM | POA: Diagnosis not present

## 2020-06-02 DIAGNOSIS — M1A09X0 Idiopathic chronic gout, multiple sites, without tophus (tophi): Secondary | ICD-10-CM

## 2020-06-02 DIAGNOSIS — M170 Bilateral primary osteoarthritis of knee: Secondary | ICD-10-CM

## 2020-06-02 DIAGNOSIS — Z9989 Dependence on other enabling machines and devices: Secondary | ICD-10-CM

## 2020-06-02 DIAGNOSIS — M19042 Primary osteoarthritis, left hand: Secondary | ICD-10-CM

## 2020-06-02 DIAGNOSIS — Z8719 Personal history of other diseases of the digestive system: Secondary | ICD-10-CM

## 2020-06-02 DIAGNOSIS — I5032 Chronic diastolic (congestive) heart failure: Secondary | ICD-10-CM

## 2020-06-02 DIAGNOSIS — Z8673 Personal history of transient ischemic attack (TIA), and cerebral infarction without residual deficits: Secondary | ICD-10-CM

## 2020-06-02 DIAGNOSIS — G4733 Obstructive sleep apnea (adult) (pediatric): Secondary | ICD-10-CM

## 2020-06-02 DIAGNOSIS — I110 Hypertensive heart disease with heart failure: Secondary | ICD-10-CM

## 2020-06-02 DIAGNOSIS — Z8639 Personal history of other endocrine, nutritional and metabolic disease: Secondary | ICD-10-CM

## 2020-06-02 DIAGNOSIS — M19041 Primary osteoarthritis, right hand: Secondary | ICD-10-CM

## 2020-06-02 DIAGNOSIS — Z7189 Other specified counseling: Secondary | ICD-10-CM

## 2020-06-02 DIAGNOSIS — N189 Chronic kidney disease, unspecified: Secondary | ICD-10-CM

## 2020-06-02 DIAGNOSIS — N289 Disorder of kidney and ureter, unspecified: Secondary | ICD-10-CM

## 2020-06-02 MED ORDER — PREDNISONE 5 MG PO TABS: ORAL_TABLET | ORAL | 0 refills | Status: DC

## 2020-06-02 NOTE — Patient Instructions (Addendum)
COVID-19 vaccine recommendations:   COVID-19 vaccine is recommended for everyone (unless you are allergic to a vaccine component), even if you are on a medication that suppresses your immune system.   If you are on Methotrexate, Cellcept (mycophenolate), Rinvoq, Morrie Sheldon, and Olumiant- hold the medication for 1 week after each vaccine. Hold Methotrexate for 2 weeks after the single dose COVID-19 vaccine.   If you are on Orencia subcutaneous injection - hold medication one week prior to and one week after the first COVID-19 vaccine dose (only).   If you are on Orencia IV infusions- time vaccination administration so that the first COVID-19 vaccination will occur four weeks after the infusion and postpone the subsequent infusion by one week.   If you are on Cyclophosphamide or Rituxan infusions please contact your doctor prior to receiving the COVID-19 vaccine.   Do not take Tylenol or any anti-inflammatory medications (NSAIDs) 24 hours prior to the COVID-19 vaccination.   There is no direct evidence about the efficacy of the COVID-19 vaccine in individuals who are on medications that suppress the immune system.   Even if you are fully vaccinated, and you are on any medications that suppress your immune system, please continue to wear a mask, maintain at least six feet social distance and practice hand hygiene.   If you develop a COVID-19 infection, please contact your PCP or our office to determine if you need antibody infusion.  The booster vaccine is now available for immunocompromised patients. It is advised that if you had Pfizer vaccine you should get Coca-[employer]a booster.  If you had a Moderna vaccine then you should get a Moderna booster. Johnson and Wynetta Emery does not have a booster vaccine at this time.  Please see the following web sites for updated information.    https://[email or web address]/[email or web address]/0/Files/[email or web address]-Vaccination-Patient-Resources.[email or web address]  [email or web address]://[email or web address]/About-Us/[email or web address]-Releases/ID/1159   Resume hydroxychloroquine 200 mg 1 tablet daily.

## 2020-06-03 LAB — COMPLETE METABOLIC PANEL WITH GFR
AG Ratio: 1.3 (calc) (ref 1.0–2.5)
ALT: 13 U/L (ref 6–29)
AST: 19 U/L (ref 10–35)
Albumin: 3.4 g/dL — ABNORMAL LOW (ref 3.6–5.1)
Alkaline phosphatase (APISO): 59 U/L (ref 37–153)
BUN/Creatinine Ratio: 17 (calc) (ref 6–22)
BUN: 38 mg/dL — ABNORMAL HIGH (ref 7–25)
CO2: 29 mmol/L (ref 20–32)
Calcium: 8.9 mg/dL (ref 8.6–10.4)
Chloride: 101 mmol/L (ref 98–110)
Creat: 2.23 mg/dL — ABNORMAL HIGH (ref 0.60–0.88)
GFR, Est African American: 21 mL/min/{1.73_m2} — ABNORMAL LOW (ref >=60)
GFR, Est Non African American: 19 mL/min/{1.73_m2} — ABNORMAL LOW (ref >=60)
Globulin: 2.6 g/dL (calc) (ref 1.9–3.7)
Glucose, Bld: 124 mg/dL — ABNORMAL HIGH (ref 65–99)
Potassium: 3.9 mmol/L (ref 3.5–5.3)
Sodium: 141 mmol/L (ref 135–146)
Total Bilirubin: 0.4 mg/dL (ref 0.2–1.2)
Total Protein: 6 g/dL — ABNORMAL LOW (ref 6.1–8.1)

## 2020-06-03 LAB — URIC ACID: Uric Acid, Serum: 12.5 mg/dL — ABNORMAL HIGH (ref 2.5–7.0)

## 2020-06-03 LAB — CBC WITH DIFFERENTIAL/PLATELET
Absolute Monocytes: 552 cells/uL (ref 200–950)
Basophils Absolute: 28 cells/uL (ref 0–200)
Basophils Relative: 0.4 %
Eosinophils Absolute: 21 cells/uL (ref 15–500)
Eosinophils Relative: 0.3 %
HCT: 28 % — ABNORMAL LOW (ref 35.0–45.0)
Hemoglobin: 9 g/dL — ABNORMAL LOW (ref 11.7–15.5)
Lymphs Abs: 1677 cells/uL (ref 850–3900)
MCH: 28 pg (ref 27.0–33.0)
MCHC: 32.1 g/dL (ref 32.0–36.0)
MCV: 87.2 fL (ref 80.0–100.0)
MPV: 11 fL (ref 7.5–12.5)
Monocytes Relative: 8 %
Neutro Abs: 4623 cells/uL (ref 1500–7800)
Neutrophils Relative %: 67 %
Platelets: 222 10*3/uL (ref 140–400)
RBC: 3.21 10*6/uL — ABNORMAL LOW (ref 3.80–5.10)
RDW: 14.5 % (ref 11.0–15.0)
Total Lymphocyte: 24.3 %
WBC: 6.9 10*3/uL (ref 3.8–10.8)

## 2020-06-03 NOTE — Progress Notes (Signed)
Please forward labs to her PCP.

## 2020-06-03 NOTE — Progress Notes (Signed)
Hemoglobin is low and stable.  Creatinine is elevated.  Which could be due to the use of Lasix.  Uric acid high which has been very well controlled in the past.  Patient was not sure which medication she was taking yesterday.  She should resume allopurinol 200 mg p.o. daily.

## 2020-06-06 NOTE — Progress Notes (Signed)
[facility]    Provider:  Dr Jaynee Eagles Requesting Provider: Marcelle Smiling, MD Primary Care Provider:  Glendale Chard, MD  CC:  Numbness in the hands  HPI:  Maria [name] is a 86 y.o. female here as requested by Marcelle Smiling, MD for numbness in the bilateral hands.  She has a past medical history of TIA, stroke, sleep apnea, hypertension, hyperlipidemia, diabetes, depression, arthritis, anxiety, diabetic peripheral vascular disease, hypertensive heart and kidney disease with chronic diastolic congestive heart failure and stage III chronic kidney disease, she is on a CPAP, insulin-dependent type 2 diabetes, seronegative rheumatoid arthritis, obesity.  She is a patient of Dr. Estanislado Pandy and symptoms in her hands are worsening, She has numbness and tingling in the fingers all day and at night. IN both hands. Started months ago, progressively worsening, weakness in the hands, worse at night and she can't open anything anymore. She has to have people open jer water even, difficulty locking her door. Numbness and tinglin gin the feet has been long term due to diabetes but the hands are different and not related. Both are symmetric. She is in PT and has a brace not using it at night. No neck pain or shooting pain in to the arms all in the hands and the fingertips numbness mostly digits 1-3 and sometimes she cannot get blood from her fingers. Also pain. Nothing makes it better. No inciting events or trauma. No other new focal neurologic deficits, associated symptoms, inciting events or modifiable factors.  Reviewed notes, labs and imaging from outside physicians, which showed:  Reviewed XR reports left and right hand  Left:  Severe CMC narrowing was noted. Severe PIP and DIP narrowing was noted.   Severe narrowing of second third fourth and fifth MCP joints was noted.   Cystic changes were noted in the third MCP joint. Intercarpal and  radiocarpal joint space narrowing was  noted.   Impression: These findings are consistent with severe osteoarthritis and  inflammatory arthritis.  Right:  Severe PIP and DIP joint space narrowing was noted. Narrowing of almost  all MCP joints was noted. Possible cystic and erosive change were noted  in the second MCP joint. Erosive changes noted in the second DIP. Severe  CMC narrowing was noted. Intercarpal and radiocarpal joint space  narrowing was noted. Generalized osteopenia was noted.   Impression: These findings are consistent with osteoarthritis and  inflammatory arthritis which could be due to gouty arthropathy or other  inflammatory arthropathy.  CMP with BUN 38, creat 2.23, CBC anemia 9/28, (06/02/2020) hgba1c 8.1 03/07/2020  Personally reviewed MRI images of the brain and agree with the following: 2017  IMPRESSION: No acute infarct or intracranial hemorrhage.  Remote basal ganglia infarcts.  Moderate chronic microvascular changes.  Moderate global atrophy without hydrocephalus.   Review of Systems: Patient complains of symptoms per HPI as well as the following symptoms: hand numbness and tingling. Pertinent negatives and positives per HPI. All others negative.   Social History   Socioeconomic History  . Marital status: Widowed    Spouse name: Not on file  . Number of children: 2  . Years of education: Not on file  . Highest education level: Not on file  Occupational History  . Occupation: retired  Tobacco Use  . Smoking status: Never Smoker  . Smokeless tobacco: Never Used  Vaping Use  . Vaping Use: Never used  Substance and Sexual Activity  . Alcohol use: No  . Drug use: No  . Sexual  activity: Not Currently  Other Topics Concern  . Not on file  Social History Narrative   Has SCAT--pt states she is using her friend for transportation now      Lives alone at [place] in an apartment for seniors. She has an aid that comes 4 days a week for 2 hours.    Right handed   Social  Determinants of Health   Financial Resource Strain: Low Risk   . Difficulty of Paying Living Expenses: Not hard at all  Food Insecurity: No Food Insecurity  . Worried About Charity fundraiser in the Last Year: Never true  . Ran Out of Food in the Last Year: Never true  Transportation Needs: No Transportation Needs  . Lack of Transportation (Medical): No  . Lack of Transportation (Non-Medical): No  Physical Activity: Inactive  . Days of Exercise per Week: 0 days  . Minutes of Exercise per Session: 0 min  Stress: No Stress Concern Present  . Feeling of Stress : Not at all  Social Connections:   . Frequency of Communication with Friends and Family: Not on file  . Frequency of Social Gatherings with Friends and Family: Not on file  . Attends Religious Services: Not on file  . Active Member of Clubs or Organizations: Not on file  . Attends Archivist Meetings: Not on file  . Marital Status: Not on file  Intimate Partner Violence:   . Fear of Current or Ex-Partner: Not on file  . Emotionally Abused: Not on file  . Physically Abused: Not on file  . Sexually Abused: Not on file    Family History  Problem Relation Age of Onset  . Healthy Mother   . Prostate cancer Father   . Cervical cancer Granddaughter   . Heart Problems Son   . Prostate cancer Son   . Neuropathy Neg Hx     Past Medical History:  Diagnosis Date  . Anxiety   . Arthritis   . Blood transfusion   . Depression   . Diabetes mellitus   . Edema   . Heart murmur   . Hyperlipidemia   . Hypertension   . Sleep apnea    cpap  . Stroke ([place])    3 x tias  . TIA (transient ischemic attack)     Patient Active Problem List   Diagnosis Date Noted  . Seronegative rheumatoid arthritis ([place]) 06/02/2020  . Chronic renal disease, stage 3, moderately decreased glomerular filtration rate between 30-59 mL/min/1.73 square meter 01/08/2020  . Hypertensive heart and kidney disease with chronic diastolic congestive  heart failure and stage 3 chronic kidney disease ([place]) 01/06/2020  . Primary osteoarthritis of right knee 01/06/2020  . Joint pain 12/03/2019  . Hav (hallux abducto valgus), right 11/04/2019  . Diabetic peripheral vascular disease ([place]) 09/15/2019  . Gout 07/02/2019  . Insulin dependent type 2 diabetes mellitus ([place]) 07/02/2019  . Acute on chronic diastolic CHF (congestive heart failure) ([place]) 03/26/2018  . Acute on chronic renal insufficiency 03/26/2018  . Dyspnea on exertion 02/14/2018  . Palpitations 02/13/2017  . Chest pain 03/25/2015  . Acute diverticulitis 03/04/2013  . Bilateral lower extremity edema 01/23/2013  . Hyperlipidemia 01/23/2013  . Diverticulitis 01/31/2012  . Diabetes mellitus ([place]) 01/31/2012  . Essential hypertension 01/31/2012  . Obstructive sleep apnea on CPAP 01/31/2012  . History of TIAs 01/31/2012  . Obesity 01/31/2012  . H/O vertigo 01/31/2012    Past Surgical History:  Procedure Laterality Date  .  ABDOMINAL HYSTERECTOMY    . APPENDECTOMY    . NM MYOVIEW LTD  [id number]   post stress left ventricle is normal in size, ejection fraction is 65%, normal myocardial perfusion study, low risk scan  . PARTIAL HYSTERECTOMY    . TRANSTHORACIC ECHOCARDIOGRAM  [id number]    Current Outpatient Medications  Medication Sig Dispense Refill  . Accu-Chek FastClix Lancets MISC CHECK BLOOD SUGAR BEFORE BREAKFAST AND DINNER 306 each 1  . ACCU-CHEK SMARTVIEW test strip TEST TWICE DAILY BEFORE BREAKFAST AND DINNER 300 strip 5  . allopurinol (ZYLOPRIM) 100 MG tablet Take 2 tablets (200 mg total) by mouth daily. 180 tablet 0  . amLODipine (NORVASC) 5 MG tablet TAKE ONE-HALF (1/2) TABLET DAILY 45 tablet 3  . aspirin 81 MG tablet Take 1 tablet (81 mg total) by mouth daily.    Marland Kitchen atorvastatin (LIPITOR) 40 MG tablet Take 1 tablet (40 mg total) by mouth daily. (Patient taking differently: Take 40 mg by mouth daily. Monday, Wednesday, and Friday) 90 tablet 3  . Cholecalciferol (VITAMIN  D) 2000 UNITS tablet Take 2,000 Units by mouth daily.    . citalopram (CELEXA) 20 MG tablet Take 20 mg by mouth daily.     . diclofenac Sodium (VOLTAREN) 1 % GEL Apply 2 g topically 4 (four) times daily. 50 g 2  . donepezil (ARICEPT) 10 MG tablet TAKE 1 TABLET DAILY IN THE EVENING 90 tablet 3  . ezetimibe (ZETIA) 10 MG tablet TAKE 1 TABLET AT BEDTIME 90 tablet 3  . ferrous sulfate 325 (65 FE) MG EC tablet Take 1 tablet (325 mg total) by mouth 2 (two) times daily. (Patient taking differently: Take 325 mg by mouth daily. ) 60 tablet 3  . FOLBIC 2.5-25-2 MG TABS tablet TAKE 1 TABLET DAILY 90 tablet 3  . furosemide (LASIX) 40 MG tablet TAKE 1 TABLET TWICE A DAY (NEED TO SCHEDULE AN APPOINTMENT) 90 tablet 1  . hydrALAZINE (APRESOLINE) 25 MG tablet TAKE 1 TABLET THREE TIMES A DAY 270 tablet 3  . hydroxychloroquine (PLAQUENIL) 200 MG tablet Take 1 tablet (200 mg total) by mouth daily. 90 tablet 0  . insulin glargine, 1 Unit Dial, (TOUJEO SOLOSTAR) 300 UNIT/ML Solostar Pen Inject 19 Units into the skin at bedtime. (Patient taking differently: Inject 17 Units into the skin at bedtime. ) 4.5 mL 3  . Insulin Pen Needle (BD PEN NEEDLE MICRO U/F) 32G X 6 MM MISC Use as directed 300 each 2  . isosorbide mononitrate (IMDUR) 30 MG 24 hr tablet TAKE 1 TABLET DAILY 90 tablet 3  . loratadine (CLARITIN) 10 MG tablet Take 10 mg by mouth every morning.    . Magnesium 250 MG TABS Take 1 tablet by mouth at bedtime.    . metolazone (ZAROXOLYN) 2.5 MG tablet TAKE 1 TABLET EVERY OTHER DAY 30 MINUTES PRIOR TO YOUR MORNING LASIX DOSE 45 tablet 6  . metoprolol succinate (TOPROL-XL) 25 MG 24 hr tablet TAKE 1 TABLET(25 MG) BY MOUTH DAILY 90 tablet 0  . potassium chloride (KLOR-CON) 10 MEQ tablet TAKE 1 TABLET TWICE A DAY 180 tablet 3  . predniSONE (DELTASONE) 5 MG tablet Take 4 tabs po x 4 days, 3  tabs po x 4 days, 2  tabs po x 4 days, 1  tab po x 4 days 40 tablet 0  . Probiotic Product (ALIGN) 4 MG CAPS Take 1 capsule by mouth  daily.    . Semaglutide,0.25 or 0.5MG /DOS, (OZEMPIC, 0.25 OR 0.5 MG/DOSE,) 2 MG/1.5ML SOPN Inject  0.375 mLs (0.5 mg total) into the skin once a week. Inject 0.5mg  into skin once weekly. 4.5 mL 2  . telmisartan (MICARDIS) 80 MG tablet TAKE 1 TABLET DAILY 90 tablet 3   No current facility-administered medications for this visit.    Allergies as of 06/07/2020 - Review Complete 06/07/2020  Allergen Reaction Noted  . Ace inhibitors Other (See Comments) 01/31/2012  . Valsartan Other (See Comments) 01/28/2019    Vitals: BP 111/67 (BP Location: Left Arm, Patient Position: Sitting)   Pulse 86   Ht 4\' 11"  (1.499 m)   Wt 155 lb (70.3 kg)   BMI 31.31 kg/m  Last Weight:  Wt Readings from Last 1 Encounters:  06/07/20 155 lb (70.3 kg)   Last Height:   Ht Readings from Last 1 Encounters:  06/07/20 4\' 11"  (1.499 m)     Physical exam: Exam: Gen: NAD, conversant, well nourised, obese, well groomed                     CV: RRR, +SEM. No Carotid Bruits. + distal peripheral edema, warm, nontender Eyes: Conjunctivae clear without exudates or hemorrhage  Neuro: Detailed Neurologic Exam  Speech:    Speech is normal; fluent and spontaneous with normal comprehension.  Cognition:    The patient is oriented to person, place, and time;     recent and remote memory grossly intact;     language fluent;     normal attention, concentration, fund of knowledge appears grossly intact  Cranial Nerves:    The pupils are equal, round, and reactive to light. Attempted fundoscopy could not visualize.. Visual fields are full to threat bilaterally. Extraocular movements are intact. Trigeminal sensation is intact and the muscles of mastication are normal. The face is symmetric. The palate elevates in the midline. Hearing intact tp voice. Voice is normal. Shoulder shrug is normal. The tongue has normal motion without fasciculations.   Coordination:    Normal finger to nose, no ataxia or dysmetria noted    Gait:  in a wheelchair, can stand with help uses a walker  Motor Observation:  distal atrophy in the hands Tone:    Normal muscle tone.    Posture:    Posture is normal in wheel chair    Strength:    Difficulty with exam due to diffuse pain but antigravity without focal deficit or drift.      Sensation: pinprick testing reveals dysesthesias/hypesthesias in digits 1-4 not so much in digit 5     Reflex Exam:  DTR's:    Deep tendon reflexes in the upper and lower extremities are symmetrical bilaterally.   Toes:    The toes are equiv bilaterally.   Clonus:    Clonus is absent.    Assessment/Plan:   86 y.o. female here as requested by Marcelle Smiling, MD for numbness in the bilateral hands.  She has a past medical history of TIA, stroke, sleep apnea, hypertension, hyperlipidemia, diabetes, depression, arthritis, anxiety, diabetic peripheral vascular disease, hypertensive heart and kidney disease with chronic diastolic congestive heart failure and stage III chronic kidney disease, she is on a CPAP, insulin-dependent type 2 diabetes, seronegative rheumatoid arthritis, obesity.  Really lovely female, sounds like Carpal Tunnel Syndrome but will check emg/ncs. May be difficult to fully assess as she also has peripheral polyneuropathy and rheumatoid arthritis but clinically appears CTS hopefully as we can address it.    Orders Placed This Encounter  Procedures  . NCV with EMG(electromyography)  Cc: Bo Merino, MD,  Glendale Chard, MD,   Sarina Ill, MD  [facility] [address] [place], [place] [address]  Phone [phone] Fax [phone]

## 2020-06-07 ENCOUNTER — Other Ambulatory Visit: Payer: Self-pay

## 2020-06-07 ENCOUNTER — Ambulatory Visit: Payer: Medicare Other | Admitting: Neurology

## 2020-06-07 ENCOUNTER — Encounter: Payer: Self-pay | Admitting: Neurology

## 2020-06-07 VITALS — BP 111/67 | HR 86 | Ht 59.0 in | Wt 155.0 lb

## 2020-06-07 DIAGNOSIS — R202 Paresthesia of skin: Secondary | ICD-10-CM

## 2020-06-07 DIAGNOSIS — M6281 Muscle weakness (generalized): Secondary | ICD-10-CM | POA: Diagnosis not present

## 2020-06-07 DIAGNOSIS — M79642 Pain in left hand: Secondary | ICD-10-CM | POA: Diagnosis not present

## 2020-06-07 DIAGNOSIS — R200 Anesthesia of skin: Secondary | ICD-10-CM

## 2020-06-07 DIAGNOSIS — M79641 Pain in right hand: Secondary | ICD-10-CM | POA: Diagnosis not present

## 2020-06-07 NOTE — Patient Instructions (Signed)
Carpal Tunnel Syndrome  Carpal tunnel syndrome is a condition that causes pain in your hand and arm. The carpal tunnel is a narrow area that is on the palm side of your wrist. Repeated wrist motion or certain diseases may cause swelling in the tunnel. This swelling can pinch the main nerve in the wrist (median nerve). What are the causes? This condition may be caused by:  Repeated wrist motions.  Wrist injuries.  Arthritis.  A sac of fluid (cyst) or abnormal growth (tumor) in the carpal tunnel.  Fluid buildup during pregnancy. Sometimes the cause is not known. What increases the risk? The following factors may make you more likely to develop this condition:  Having a job in which you move your wrist in the same way many times. This includes jobs like being a butcher or a cashier.  Being a woman.  Having other health conditions, such as: ? Diabetes. ? Obesity. ? A thyroid gland that is not active enough (hypothyroidism). ? Kidney failure. What are the signs or symptoms? Symptoms of this condition include:  A tingling feeling in your fingers.  Tingling or a loss of feeling (numbness) in your hand.  Pain in your entire arm. This pain may get worse when you bend your wrist and elbow for a long time.  Pain in your wrist that goes up your arm to your shoulder.  Pain that goes down into your palm or fingers.  A weak feeling in your hands. You may find it hard to grab and hold items. You may feel worse at night. How is this diagnosed? This condition is diagnosed with a medical history and physical exam. You may also have tests, such as:  Electromyogram (EMG). This test checks the signals that the nerves send to the muscles.  Nerve conduction study. This test checks how well signals pass through your nerves.  Imaging tests, such as X-rays, ultrasound, and MRI. These tests check for what might be the cause of your condition. How is this treated? This condition may be treated  with:  Lifestyle changes. You will be asked to stop or change the activity that caused your problem.  Doing exercise and activities that make bones and muscles stronger (physical therapy).  Learning how to use your hand again (occupational therapy).  Medicines for pain and swelling (inflammation). You may have injections in your wrist.  A wrist splint.  Surgery. Follow these instructions at home: If you have a splint:  Wear the splint as told by your doctor. Remove it only as told by your doctor.  Loosen the splint if your fingers: ? Tingle. ? Lose feeling (become numb). ? Turn cold and blue.  Keep the splint clean.  If the splint is not waterproof: ? Do not let it get wet. ? Cover it with a watertight covering when you take a bath or a shower. Managing pain, stiffness, and swelling   If told, put ice on the painful area: ? If you have a removable splint, remove it as told by your doctor. ? Put ice in a plastic bag. ? Place a towel between your skin and the bag. ? Leave the ice on for 20 minutes, 2-3 times per day. General instructions  Take over-the-counter and prescription medicines only as told by your doctor.  Rest your wrist from any activity that may cause pain. If needed, talk with your boss at work about changes that can help your wrist heal.  Do any exercises as told by your doctor,   physical therapist, or occupational therapist.  Keep all follow-up visits as told by your doctor. This is important. Contact a doctor if:  You have new symptoms.  Medicine does not help your pain.  Your symptoms get worse. Get help right away if:  You have very bad numbness or tingling in your wrist or hand. Summary  Carpal tunnel syndrome is a condition that causes pain in your hand and arm.  It is often caused by repeated wrist motions.  Lifestyle changes and medicines are used to treat this problem. Surgery may help in very bad cases.  Follow your doctor's  instructions about wearing a splint, resting your wrist, keeping follow-up visits, and calling for help. This information is not intended to replace advice given to you by your health care provider. Make sure you discuss any questions you have with your health care provider. Document Revised: 01/03/2018 Document Reviewed: 01/03/2018 Elsevier Patient Education  2020 [place] is a test to check how well your muscles and nerves are working. This procedure includes the combined use of electromyogram (EMG) and nerve conduction study (NCS). EMG is used to look for muscular disorders. NCS, which is also called electroneurogram, measures how well your nerves are controlling your muscles. The procedures are usually done together to check if your muscles and nerves are healthy. If the results of the tests are abnormal, this may indicate disease or injury, such as a neuromuscular disease or peripheral nerve damage. Tell a health care provider about:  Any allergies you have.  All medicines you are taking, including vitamins, herbs, eye drops, creams, and over-the-counter medicines.  Any problems you or family members have had with anesthetic medicines.  Any blood disorders you have.  Any surgeries you have had.  Any medical conditions you have.  If you have a pacemaker.  Whether you are pregnant or may be pregnant. What are the risks? Generally, this is a safe procedure. However, problems may occur, including:  Infection where the electrodes were inserted.  Bleeding. What happens before the procedure? Medicines Ask your health care provider about:  Changing or stopping your regular medicines. This is especially important if you are taking diabetes medicines or blood thinners.  Taking medicines such as aspirin and ibuprofen. These medicines can thin your blood. Do not take these medicines unless your health care provider tells you to take  them.  Taking over-the-counter medicines, vitamins, herbs, and supplements. General instructions  Your health care provider may ask you to avoid: ? Beverages that have caffeine, such as coffee and tea. ? Any products that contain nicotine or tobacco. These products include cigarettes, e-cigarettes, and chewing tobacco. If you need help quitting, ask your health care provider.  Do not use lotions or creams on the same day that you will be having the procedure. What happens during the procedure? For EMG   Your health care provider will ask you to stay in a position so that he or she can access the muscle that will be studied. You may be standing, sitting, or lying down.  You may be given a medicine that numbs the area (local anesthetic).  A very thin needle that has an electrode will be inserted into your muscle.  Another small electrode will be placed on your skin near the muscle.  Your health care provider will ask you to continue to remain still.  The electrodes will send a signal that tells about the electrical activity of your muscles. You may see  this on a monitor or hear it in the room.  After your muscles have been studied at rest, your health care provider will ask you to contract or flex your muscles. The electrodes will send a signal that tells about the electrical activity of your muscles.  Your health care provider will remove the electrodes and the electrode needles when the procedure is finished. The procedure may vary among health care providers and hospitals. For NCS   An electrode that records your nerve activity (recording electrode) will be placed on your skin by the muscle that is being studied.  An electrode that is used as a reference (reference electrode) will be placed near the recording electrode.  A paste or gel will be applied to your skin between the recording electrode and the reference electrode.  Your nerve will be stimulated with a mild shock.  Your health care provider will measure how much time it takes for your muscle to react.  Your health care provider will remove the electrodes and the gel when the procedure is finished. The procedure may vary among health care providers and hospitals. What happens after the procedure?  It is up to you to get the results of your procedure. Ask your health care provider, or the department that is doing the procedure, when your results will be ready.  Your health care provider may: ? Give you medicines for any pain. ? Monitor the insertion sites to make sure that bleeding stops. Summary  Electromyoneurogram is a test to check how well your muscles and nerves are working.  If the results of the tests are abnormal, this may indicate disease or injury.  This is a safe procedure. However, problems may occur, such as bleeding and infection.  Your health care provider will do two tests to complete this procedure. One checks your muscles (EMG) and another checks your nerves (NCS).  It is up to you to get the results of your procedure. Ask your health care provider, or the department that is doing the procedure, when your results will be ready. This information is not intended to replace advice given to you by your health care provider. Make sure you discuss any questions you have with your health care provider. Document Revised: 05/13/2018 Document Reviewed: 04/25/2018 Elsevier Patient Education  [place].

## 2020-06-08 ENCOUNTER — Ambulatory Visit: Payer: Medicare Other | Admitting: Podiatry

## 2020-06-08 ENCOUNTER — Encounter: Payer: Self-pay | Admitting: Podiatry

## 2020-06-08 ENCOUNTER — Other Ambulatory Visit: Payer: Self-pay

## 2020-06-08 DIAGNOSIS — M2042 Other hammer toe(s) (acquired), left foot: Secondary | ICD-10-CM

## 2020-06-08 DIAGNOSIS — M2041 Other hammer toe(s) (acquired), right foot: Secondary | ICD-10-CM

## 2020-06-08 DIAGNOSIS — M2011 Hallux valgus (acquired), right foot: Secondary | ICD-10-CM | POA: Diagnosis not present

## 2020-06-08 DIAGNOSIS — E1151 Type 2 diabetes mellitus with diabetic peripheral angiopathy without gangrene: Secondary | ICD-10-CM | POA: Diagnosis not present

## 2020-06-08 DIAGNOSIS — I7389 Other specified peripheral vascular diseases: Secondary | ICD-10-CM

## 2020-06-08 DIAGNOSIS — M79676 Pain in unspecified toe(s): Secondary | ICD-10-CM

## 2020-06-08 DIAGNOSIS — B351 Tinea unguium: Secondary | ICD-10-CM

## 2020-06-08 NOTE — Progress Notes (Signed)
This patient returns to my office for at risk foot care.  This patient requires this care by a professional since this patient will be at risk due to having diabetes with angiopathy and neuropathy and CKD.    This patient is unable to cut nails herself since the patient cannot reach her nails.These nails are painful walking and wearing shoes.  This patient presents for at risk foot care today.  General Appearance  Alert, conversant and in no acute stress.  Vascular  Dorsalis pedis and posterior tibial weakly  pulses are palpable  bilaterally.  Capillary return is within normal limits  bilaterally. Temperature is within normal limits  bilaterally.  Neurologic  Senn-Weinstein monofilament wire test diminished  bilaterally. Muscle power within normal limits bilaterally.  Nails Thick disfigured discolored nails with subungual debris  from hallux to fifth toes bilaterally. No evidence of bacterial infection or drainage bilaterally.  Orthopedic  No limitations of motion  feet .  No crepitus or effusions noted.  No bony pathology or digital deformities noted.  Skin  normotropic skin with no porokeratosis noted bilaterally.  No signs of infections or ulcers noted.     Onychomycosis  Pain in right toes  Pain in left toes  Consent was obtained for treatment procedures.   Mechanical debridement of nails 1-5  bilaterally performed with a nail nipper.  Filed with dremel without incident.    Return office visit  10 weeks                    Told patient to return for periodic foot care and evaluation due to potential at risk complications.   Gardiner Barefoot DPM

## 2020-06-13 ENCOUNTER — Other Ambulatory Visit: Payer: Self-pay

## 2020-06-13 ENCOUNTER — Encounter: Payer: Self-pay | Admitting: Internal Medicine

## 2020-06-13 ENCOUNTER — Ambulatory Visit (INDEPENDENT_AMBULATORY_CARE_PROVIDER_SITE_OTHER): Payer: Medicare Other | Admitting: Internal Medicine

## 2020-06-13 ENCOUNTER — Ambulatory Visit: Payer: Medicare Other | Admitting: Internal Medicine

## 2020-06-13 VITALS — BP 112/66 | HR 70 | Temp 98.3°F | Ht 59.0 in | Wt 152.8 lb

## 2020-06-13 DIAGNOSIS — E1122 Type 2 diabetes mellitus with diabetic chronic kidney disease: Secondary | ICD-10-CM

## 2020-06-13 DIAGNOSIS — E6609 Other obesity due to excess calories: Secondary | ICD-10-CM

## 2020-06-13 DIAGNOSIS — Z23 Encounter for immunization: Secondary | ICD-10-CM

## 2020-06-13 DIAGNOSIS — Z794 Long term (current) use of insulin: Secondary | ICD-10-CM | POA: Diagnosis not present

## 2020-06-13 DIAGNOSIS — I5032 Chronic diastolic (congestive) heart failure: Secondary | ICD-10-CM

## 2020-06-13 DIAGNOSIS — Z6830 Body mass index (BMI) 30.0-30.9, adult: Secondary | ICD-10-CM

## 2020-06-13 DIAGNOSIS — I130 Hypertensive heart and chronic kidney disease with heart failure and stage 1 through stage 4 chronic kidney disease, or unspecified chronic kidney disease: Secondary | ICD-10-CM | POA: Diagnosis not present

## 2020-06-13 DIAGNOSIS — N183 Chronic kidney disease, stage 3 unspecified: Secondary | ICD-10-CM | POA: Diagnosis not present

## 2020-06-13 DIAGNOSIS — R202 Paresthesia of skin: Secondary | ICD-10-CM

## 2020-06-13 NOTE — Patient Instructions (Signed)
Diabetes Mellitus and Foot Care Foot care is an important part of your health, especially when you have diabetes. Diabetes may cause you to have problems because of poor blood flow (circulation) to your feet and legs, which can cause your skin to:  Become thinner and drier.  Break more easily.  Heal more slowly.  Peel and crack. You may also have nerve damage (neuropathy) in your legs and feet, causing decreased feeling in them. This means that you may not notice minor injuries to your feet that could lead to more serious problems. Noticing and addressing any potential problems early is the best way to prevent future foot problems. How to care for your feet Foot hygiene  Wash your feet daily with warm water and mild soap. Do not use hot water. Then, pat your feet and the areas between your toes until they are completely dry. Do not soak your feet as this can dry your skin.  Trim your toenails straight across. Do not dig under them or around the cuticle. File the edges of your nails with an emery board or nail file.  Apply a moisturizing lotion or petroleum jelly to the skin on your feet and to dry, brittle toenails. Use lotion that does not contain alcohol and is unscented. Do not apply lotion between your toes. Shoes and socks  Wear clean socks or stockings every day. Make sure they are not too tight. Do not wear knee-high stockings since they may decrease blood flow to your legs.  Wear shoes that fit properly and have enough cushioning. Always look in your shoes before you put them on to be sure there are no objects inside.  To break in new shoes, wear them for just a few hours a day. This prevents injuries on your feet. Wounds, scrapes, corns, and calluses  Check your feet daily for blisters, cuts, bruises, sores, and redness. If you cannot see the bottom of your feet, use a mirror or ask someone for help.  Do not cut corns or calluses or try to remove them with medicine.  If you  find a minor scrape, cut, or break in the skin on your feet, keep it and the skin around it clean and dry. You may clean these areas with mild soap and water. Do not clean the area with peroxide, alcohol, or iodine.  If you have a wound, scrape, corn, or callus on your foot, look at it several times a day to make sure it is healing and not infected. Check for: ? Redness, swelling, or pain. ? Fluid or blood. ? Warmth. ? Pus or a bad smell. General instructions  Do not cross your legs. This may decrease blood flow to your feet.  Do not use heating pads or hot water bottles on your feet. They may burn your skin. If you have lost feeling in your feet or legs, you may not know this is happening until it is too late.  Protect your feet from hot and cold by wearing shoes, such as at the beach or on hot pavement.  Schedule a complete foot exam at least once a year (annually) or more often if you have foot problems. If you have foot problems, report any cuts, sores, or bruises to your health care provider immediately. Contact a health care provider if:  You have a medical condition that increases your risk of infection and you have any cuts, sores, or bruises on your feet.  You have an injury that is not   healing.  You have redness on your legs or feet.  You feel burning or tingling in your legs or feet.  You have pain or cramps in your legs and feet.  Your legs or feet are numb.  Your feet always feel cold.  You have pain around a toenail. Get help right away if:  You have a wound, scrape, corn, or callus on your foot and: ? You have pain, swelling, or redness that gets worse. ? You have fluid or blood coming from the wound, scrape, corn, or callus. ? Your wound, scrape, corn, or callus feels warm to the touch. ? You have pus or a bad smell coming from the wound, scrape, corn, or callus. ? You have a fever. ? You have a red line going up your leg. Summary  Check your feet every day  for cuts, sores, red spots, swelling, and blisters.  Moisturize feet and legs daily.  Wear shoes that fit properly and have enough cushioning.  If you have foot problems, report any cuts, sores, or bruises to your health care provider immediately.  Schedule a complete foot exam at least once a year (annually) or more often if you have foot problems. This information is not intended to replace advice given to you by your health care provider. Make sure you discuss any questions you have with your health care provider. Document Revised: 05/20/2019 Document Reviewed: 09/28/2016 Elsevier Patient Education  2020 Elsevier Inc[employer]  

## 2020-06-13 NOTE — Progress Notes (Signed)
I,Katawbba Wiggins,acting as a Education administrator for Maximino Greenland, MD.,have documented all relevant documentation on the behalf of Maximino Greenland, MD,as directed by  Maximino Greenland, MD while in the presence of Maximino Greenland, MD.  This visit occurred during the SARS-CoV-2 public health emergency.  Safety protocols were in place, including screening questions prior to the visit, additional usage of staff PPE, and extensive cleaning of exam room while observing appropriate contact time as indicated for disinfecting solutions.  Subjective:     Patient ID: Maria [name] , female    DOB: 04/19/1926 , 86 y.o.   MRN: [id number]   Chief Complaint  Patient presents with  . Diabetes  . Hypertension    HPI  She is here today for diabetes and high blood pressure f/u. She reports compliance with meds. States blood sugars have been up in the evenings since she has been on prednisone. This was started on 9/23. She started with 4 tablets, now down to 2 tablets. Will start one tablet later this week. She has no other new concerns. Adds that she has been seen by Neuro for wrist pain, has h/o CTS. Scheduled for NCS late October.   Diabetes She presents for her follow-up diabetic visit. She has type 2 diabetes mellitus. Her disease course has been fluctuating. There are no hypoglycemic associated symptoms. Pertinent negatives for diabetes include no blurred vision. There are no hypoglycemic complications. Diabetic complications include nephropathy. Risk factors for coronary artery disease include diabetes mellitus, dyslipidemia, hypertension, obesity, sedentary lifestyle and post-menopausal.  Hypertension This is a chronic problem. The current episode started more than 1 year ago. The problem has been gradually improving since onset. The problem is controlled. Pertinent negatives include no blurred vision or palpitations.     Past Medical History:  Diagnosis Date  . Anxiety   . Arthritis   . Blood transfusion    . Depression   . Diabetes mellitus   . Edema   . Heart murmur   . Hyperlipidemia   . Hypertension   . Sleep apnea    cpap  . Stroke ([place])    3 x tias  . TIA (transient ischemic attack)      Family History  Problem Relation Age of Onset  . Healthy Mother   . Prostate cancer Father   . Cervical cancer Granddaughter   . Heart Problems Son   . Prostate cancer Son   . Neuropathy Neg Hx      Current Outpatient Medications:  .  Accu-Chek FastClix Lancets MISC, CHECK BLOOD SUGAR BEFORE BREAKFAST AND DINNER, Disp: 306 each, Rfl: 1 .  ACCU-CHEK SMARTVIEW test strip, TEST TWICE DAILY BEFORE BREAKFAST AND DINNER, Disp: 300 strip, Rfl: 5 .  allopurinol (ZYLOPRIM) 100 MG tablet, Take 2 tablets (200 mg total) by mouth daily., Disp: 180 tablet, Rfl: 0 .  amLODipine (NORVASC) 5 MG tablet, TAKE ONE-HALF (1/2) TABLET DAILY (Patient taking differently: 5 mg daily. ), Disp: 45 tablet, Rfl: 3 .  aspirin 81 MG tablet, Take 1 tablet (81 mg total) by mouth daily., Disp: , Rfl:  .  atorvastatin (LIPITOR) 40 MG tablet, Take 1 tablet (40 mg total) by mouth daily. (Patient taking differently: Take 40 mg by mouth daily. Monday, Wednesday, and Friday), Disp: 90 tablet, Rfl: 3 .  Cholecalciferol (VITAMIN D) 2000 UNITS tablet, Take 2,000 Units by mouth daily., Disp: , Rfl:  .  citalopram (CELEXA) 20 MG tablet, Take 20 mg by mouth daily. , Disp: , Rfl:  .  diclofenac Sodium (VOLTAREN) 1 % GEL, Apply 2 g topically 4 (four) times daily., Disp: 50 g, Rfl: 2 .  donepezil (ARICEPT) 10 MG tablet, TAKE 1 TABLET DAILY IN THE EVENING, Disp: 90 tablet, Rfl: 3 .  ezetimibe (ZETIA) 10 MG tablet, TAKE 1 TABLET AT BEDTIME, Disp: 90 tablet, Rfl: 3 .  ferrous sulfate 325 (65 FE) MG EC tablet, Take 1 tablet (325 mg total) by mouth 2 (two) times daily. (Patient taking differently: Take 325 mg by mouth daily. ), Disp: 60 tablet, Rfl: 3 .  FOLBIC 2.5-25-2 MG TABS tablet, TAKE 1 TABLET DAILY, Disp: 90 tablet, Rfl: 3 .  furosemide  (LASIX) 40 MG tablet, TAKE 1 TABLET TWICE A DAY (NEED TO SCHEDULE AN APPOINTMENT), Disp: 90 tablet, Rfl: 1 .  hydrALAZINE (APRESOLINE) 25 MG tablet, TAKE 1 TABLET THREE TIMES A DAY, Disp: 270 tablet, Rfl: 3 .  hydroxychloroquine (PLAQUENIL) 200 MG tablet, Take 1 tablet (200 mg total) by mouth daily., Disp: 90 tablet, Rfl: 0 .  insulin glargine, 1 Unit Dial, (TOUJEO SOLOSTAR) 300 UNIT/ML Solostar Pen, Inject 19 Units into the skin at bedtime. (Patient taking differently: Inject 17 Units into the skin at bedtime. ), Disp: 4.5 mL, Rfl: 3 .  Insulin Pen Needle (BD PEN NEEDLE MICRO U/F) 32G X 6 MM MISC, Use as directed, Disp: 300 each, Rfl: 2 .  isosorbide mononitrate (IMDUR) 30 MG 24 hr tablet, TAKE 1 TABLET DAILY, Disp: 90 tablet, Rfl: 3 .  loratadine (CLARITIN) 10 MG tablet, Take 10 mg by mouth every morning., Disp: , Rfl:  .  metolazone (ZAROXOLYN) 2.5 MG tablet, TAKE 1 TABLET EVERY OTHER DAY 30 MINUTES PRIOR TO YOUR MORNING LASIX DOSE, Disp: 45 tablet, Rfl: 6 .  metoprolol succinate (TOPROL-XL) 25 MG 24 hr tablet, TAKE 1 TABLET(25 MG) BY MOUTH DAILY, Disp: 90 tablet, Rfl: 0 .  potassium chloride (KLOR-CON) 10 MEQ tablet, TAKE 1 TABLET TWICE A DAY, Disp: 180 tablet, Rfl: 3 .  predniSONE (DELTASONE) 5 MG tablet, Take 4 tabs po x 4 days, 3  tabs po x 4 days, 2  tabs po x 4 days, 1  tab po x 4 days, Disp: 40 tablet, Rfl: 0 .  Probiotic Product (ALIGN) 4 MG CAPS, Take 1 capsule by mouth daily., Disp: , Rfl:  .  Semaglutide,0.25 or 0.5MG /DOS, (OZEMPIC, 0.25 OR 0.5 MG/DOSE,) 2 MG/1.5ML SOPN, Inject 0.375 mLs (0.5 mg total) into the skin once a week. Inject 0.5mg  into skin once weekly., Disp: 4.5 mL, Rfl: 2 .  telmisartan (MICARDIS) 80 MG tablet, TAKE 1 TABLET DAILY, Disp: 90 tablet, Rfl: 3   Allergies  Allergen Reactions  . Ace Inhibitors Other (See Comments)    Angioedema   . Valsartan Other (See Comments)     Review of Systems  Constitutional: Negative.   HENT: Negative.   Eyes: Negative.   Negative for blurred vision.  Respiratory: Negative.   Cardiovascular: Negative.  Negative for palpitations.  Gastrointestinal: Negative.   Endocrine: Negative.   Genitourinary: Negative.   Musculoskeletal: Negative.   Skin: Negative.   Allergic/Immunologic: Negative.   Neurological: Positive for numbness.  Hematological: Negative.   Psychiatric/Behavioral: Negative.      Today's Vitals   06/13/20 0924  BP: 112/66  Pulse: 70  Temp: 98.3 F (36.8 C)  TempSrc: Oral  Weight: 152 lb 12.8 oz (69.3 kg)  Height: 4\' 11"  (1.499 m)  PainSc: 10-Worst pain ever  PainLoc: Hand   Body mass index is 30.86 kg/m.  Objective:  Physical Exam Vitals and nursing note reviewed.  Constitutional:      Appearance: Normal appearance. She is obese.  HENT:     Head: Normocephalic and atraumatic.  Cardiovascular:     Rate and Rhythm: Normal rate and regular rhythm.     Heart sounds: Normal heart sounds.  Pulmonary:     Breath sounds: Normal breath sounds.  Skin:    General: Skin is warm.  Neurological:     General: No focal deficit present.     Mental Status: She is alert and oriented to person, place, and time.         Assessment And Plan:     1. Type 2 diabetes mellitus with stage 3 chronic kidney disease, with long-term current use of insulin, unspecified whether stage 3a or 3b CKD ([place]) Comments: Chronic, I will check an a1c. Encouraged her to get COVID booster - advised to wait at least two weeks.  - Hemoglobin A1c  2. Hypertensive heart and kidney disease with chronic diastolic congestive heart failure and stage 3 chronic kidney disease, unspecified whether stage 3a or 3b CKD ([place]) Comments: Chronic, well controlled. She will continue with current meds. Encouraged to follow low sodium diet.   3. Paresthesia of hand, bilateral Comments: Has h/o CTS. Has been evaluated by Neuro, notes have been reviewed. EMG/NCS scheduled for 10/21. Will refer her to Hand specialist as needed.    4. Need for vaccination Comments: She was given high dose flu vaccine today.  - Flu Vaccine QUAD High Dose(Fluad)  5. Class 1 obesity due to excess calories with serious comorbidity and body mass index (BMI) of 30.0 to 30.9 in adult Comments: Her BMI is acceptable for her demographics. Ambulatory with walker. Encouraged to perform chair exercises while watching TV.     Patient was given opportunity to ask questions. Patient verbalized understanding of the plan and was able to repeat key elements of the plan. All questions were answered to their satisfaction.  Maximino Greenland, MD   I, Maximino Greenland, MD, have reviewed all documentation for this visit. The documentation on 06/26/20 for the exam, diagnosis, procedures, and orders are all accurate and complete.  THE PATIENT IS ENCOURAGED TO PRACTICE SOCIAL DISTANCING DUE TO THE COVID-19 PANDEMIC.

## 2020-06-14 LAB — HEMOGLOBIN A1C
Est. average glucose Bld gHb Est-mCnc: 126 mg/dL
Hgb A1c MFr Bld: 6 % — ABNORMAL HIGH (ref 4.8–5.6)

## 2020-06-17 ENCOUNTER — Other Ambulatory Visit: Payer: Self-pay

## 2020-06-17 ENCOUNTER — Telehealth: Payer: Self-pay

## 2020-06-17 ENCOUNTER — Telehealth: Payer: Medicare Other

## 2020-06-17 ENCOUNTER — Ambulatory Visit: Payer: Self-pay

## 2020-06-17 DIAGNOSIS — E1122 Type 2 diabetes mellitus with diabetic chronic kidney disease: Secondary | ICD-10-CM

## 2020-06-17 DIAGNOSIS — D631 Anemia in chronic kidney disease: Secondary | ICD-10-CM

## 2020-06-17 DIAGNOSIS — E782 Mixed hyperlipidemia: Secondary | ICD-10-CM

## 2020-06-17 DIAGNOSIS — I130 Hypertensive heart and chronic kidney disease with heart failure and stage 1 through stage 4 chronic kidney disease, or unspecified chronic kidney disease: Secondary | ICD-10-CM

## 2020-06-17 DIAGNOSIS — N183 Chronic kidney disease, stage 3 unspecified: Secondary | ICD-10-CM

## 2020-06-17 DIAGNOSIS — N189 Chronic kidney disease, unspecified: Secondary | ICD-10-CM

## 2020-06-17 DIAGNOSIS — I5032 Chronic diastolic (congestive) heart failure: Secondary | ICD-10-CM

## 2020-06-17 DIAGNOSIS — Z794 Long term (current) use of insulin: Secondary | ICD-10-CM

## 2020-06-17 DIAGNOSIS — M109 Gout, unspecified: Secondary | ICD-10-CM

## 2020-06-17 NOTE — Chronic Care Management (AMB) (Signed)
Chronic Care Management Pharmacy  Name: Maria [name]  MRN: [id number] DOB: July 24, 1926  Chief Complaint/ HPI  Maria [name],  86 y.o. , female presents for their Follow-Up CCM visit with the clinical pharmacist via telephone to review recent blood sugar readings. Pt found out that she has carpal tunnel in her hands and is going to have surgery done.   PCP : Maria Chard, MD  Their chronic conditions include: Hypertension, CHF, Diabetes, CKD, Hyperlipidemia and Gout  Current Diagnosis/Assessment: SDOH Interventions     Most Recent Value  SDOH Interventions  Financial Strain Interventions Intervention Not Indicated     Goals Addressed            This Visit's Progress   . Pharmacy Care Plan       CARE PLAN ENTRY (see longitudinal plan of care for additional care plan information)  Current Barriers:  . Chronic Disease Management support, education, and care coordination needs related to Hypertension, Hyperlipidemia, Diabetes, and Heart Failure   Hypertension BP Readings from Last 3 Encounters:  06/13/20 112/66  06/07/20 111/67  06/02/20 110/74   . Pharmacist Clinical Goal(s): o Over the next 90 days, patient will work with PharmD and providers to maintain BP goal <130/80 . Current regimen:  o Amlodipine 5mg  1/2 tablet daily o Telmisartan 80mg  daily . Interventions: o Dietary recommendations provided (limit salt intake) o Recommend patient check blood pressure more consistently . Patient self care activities - Over the next 90 days, patient will: o Check BP 1-2 times weekly and if symptomatic, document, and provide at future appointments o Ensure daily salt intake < 2300 mg/day  Hyperlipidemia Lab Results  Component Value Date/Time   LDLCALC 71 01/06/2020 12:03 PM   . Pharmacist Clinical Goal(s): o Over the next 90 days, patient will work with PharmD and providers to maintain LDL goal < 70 . Current regimen:  o Atorvastatin 40mg  three times  weekly o Ezetimibe 10mg  daily . Interventions: o Provided dietary and exercise recommendations o Discussed cholesterol levels are great . Patient self care activities - Over the next 90 days, patient will: o Take cholesterol medication as directed o Exercise as able  Diabetes Lab Results  Component Value Date/Time   HGBA1C 6.0 (H) 06/13/2020 10:41 AM   HGBA1C 8.1 (H) 03/07/2020 11:01 AM   . Pharmacist Clinical Goal(s): o Over the next 90 days, patient will work with PharmD and providers to maintain A1c goal <7% . Current regimen:  o Toujeo 17 units nightly o Ozempic 0.5mg  once weekly . Interventions: o Discussed current diabetes treatment regimen o Reviewed recent blood sugar readings o Assessed for symptoms of low blood sugar, patient denies o Discussed eating small meals/portions when appetite is low o Will eventually decrease insulin as patient's blood sugar comes down after completing prednisone . Patient self care activities - Over the next 90 days, patient will: o Check blood sugar twice daily, document, and provide at future appointments o Contact provider with any episodes of hypoglycemia o Continue Toujeo 17 units daily and Ozempic 0.5mg  once weekly  Heart Failure . Pharmacist Clinical Goal(s) o Over the next 90 days, patient will work with PharmD and providers to reduce symptoms associated with heart failure . Current regimen:   Furosemide 40mg  twice daily   Hydralazine 25mg  three times daily  Isosorbide mononitrate ER 30mg  daily  Metolazone 2.5mg  every other day prior to morning Lasix dose  Metoprolol Succinate XL 25mg  daily  Potassium 10mEq twice daily . Interventions: o  Encouraged patient to exercise as able . Patient self care activities - Over the next 90 days, patient will: o Weigh daily and record to check for fluid buildup o Notify PCP of significant shortness of breath  Medication management . Pharmacist Clinical Goal(s): o Over the next 90  days, patient will work with PharmD and providers to achieve optimal medication adherence . [facility] . Interventions o Comprehensive medication review performed. o Continue current medication management strategy o Continue Docusate twice daily and Dulcolax as needed for constipation . Patient self care activities - Over the next 90 days, patient will: o Focus on medication adherence by continued use of pillbox and calendar reminders o Take medications as prescribed o Report any questions or concerns to PharmD and/or provider(s)  Please see past updates related to this goal by clicking on the "Past Updates" button in the selected goal        Diabetes   Recent Relevant Labs: Lab Results  Component Value Date/Time   HGBA1C 6.0 (H) 06/13/2020 10:41 AM   HGBA1C 8.1 (H) 03/07/2020 11:01 AM   MICROALBUR 10 09/15/2019 12:20 PM   MICROALBUR 10 03/03/2019 12:52 PM    Checking BG: 2x per Day  Recent FBG Readings: 92, 124, 108, 98 Recent pre-meal BG readings (around 4pm before Toujeo dose): 114, 158, 130  Patient has failed these meds in past: Lantus Patient is currently uncontrolled on the following medications:   Toujeo 17 units in the afternoon  Ozempic 0.5mg  weekly on Thursdays  We discussed  HgbA1c is down to 6%. Congratulated patient on big improvement!  Pt forgot Ozempic dose yesterday morning, but took it this morning  Reviewed BG since OV with Dr. Baird [name] on 10/4  Denies symptoms of hypoglycemia  Pt states her appetite is coming back  Discussed eating small portions/small meals when her appetite is not good  Pt is concerned about kidney function and is going to follow up with nephrologist   Plan Continue current medications  Will begin wean off of insulin as able in the future  Anemia   Patient has failed these meds in past: N/A Patient is currently controlled on the following medications:   Ferrous sulfate  325mg  twice daily  We discussed:    Pt is only taking ferrous sulfate once daily right now  Pt states that constipation has been getting better  Stool softener she is taking is helping  Plan Continue current medications   Follow up: 6 week phone visit  Maria [name], PharmD Clinical Pharmacist [facility] [phone]

## 2020-06-17 NOTE — Telephone Encounter (Cosign Needed)
  Chronic Care Management   Outreach Note  06/17/2020 Name: Maria [name] MRN: [id number] DOB: [birth year]  Referred by: [place] Chard, MD Reason for referral : Chronic Care Management (CCM RNCM FU Call )   An unsuccessful telephone outreach was attempted today. The patient was referred to the case management team for assistance with care management and care coordination.   Follow Up Plan: A HIPAA compliant phone message was left for the patient providing contact information and requesting a return call.   Barb Merino, RN, BSN, CCM Care Management Coordinator [place] [facility]  Direct Phone: [phone]

## 2020-06-17 NOTE — Patient Instructions (Addendum)
Visit Information  Goals Addressed            This Visit's Progress   . Pharmacy Care Plan       CARE PLAN ENTRY (see longitudinal plan of care for additional care plan information)  Current Barriers:  . Chronic Disease Management support, education, and care coordination needs related to Hypertension, Hyperlipidemia, Diabetes, and Heart Failure   Hypertension BP Readings from Last 3 Encounters:  06/13/20 112/66  06/07/20 111/67  06/02/20 110/74   . Pharmacist Clinical Goal(s): o Over the next 90 days, patient will work with PharmD and providers to maintain BP goal <130/80 . Current regimen:  o Amlodipine 38m 1/2 tablet daily o Telmisartan 857mdaily . Interventions: o Dietary recommendations provided (limit salt intake) o Recommend patient check blood pressure more consistently . Patient self care activities - Over the next 90 days, patient will: o Check BP 1-2 times weekly and if symptomatic, document, and provide at future appointments o Ensure daily salt intake < 2300 mg/day  Hyperlipidemia Lab Results  Component Value Date/Time   LDLCALC 71 01/06/2020 12:03 PM   . Pharmacist Clinical Goal(s): o Over the next 90 days, patient will work with PharmD and providers to maintain LDL goal < 70 . Current regimen:  o Atorvastatin 4021mhree times weekly o Ezetimibe 43m71mily . Interventions: o Provided dietary and exercise recommendations o Discussed cholesterol levels are great . Patient self care activities - Over the next 90 days, patient will: o Take cholesterol medication as directed o Exercise as able  Diabetes Lab Results  Component Value Date/Time   HGBA1C 6.0 (H) 06/13/2020 10:41 AM   HGBA1C 8.1 (H) 03/07/2020 11:01 AM   . Pharmacist Clinical Goal(s): o Over the next 90 days, patient will work with PharmD and providers to maintain A1c goal <7% . Current regimen:  o Toujeo 17 units nightly o Ozempic 0.5mg 27me weekly . Interventions: o Discussed  current diabetes treatment regimen o Reviewed recent blood sugar readings o Assessed for symptoms of low blood sugar, patient denies o Discussed eating small meals/portions when appetite is low o Will eventually decrease insulin as patient's blood sugar comes down after completing prednisone . Patient self care activities - Over the next 90 days, patient will: o Check blood sugar twice daily, document, and provide at future appointments o Contact provider with any episodes of hypoglycemia o Continue Toujeo 17 units daily and Ozempic 0.5mg o44m weekly  Heart Failure . Pharmacist Clinical Goal(s) o Over the next 90 days, patient will work with PharmD and providers to reduce symptoms associated with heart failure . Current regimen:   Furosemide 40mg t30m daily   Hydralazine 25mg th29mtimes daily  Isosorbide mononitrate ER 30mg dai78mMetolazone 2.5mg every41mher day prior to morning Lasix dose  Metoprolol Succinate XL 25mg daily78mtassium 43mEq twice65mly . Interventions: o Encouraged patient to exercise as able . Patient self care activities - Over the next 90 days, patient will: o Weigh daily and record to check for fluid buildup o Notify PCP of significant shortness of breath  Medication management . Pharmacist Clinical Goal(s): o Over the next 90 days, patient will work with PharmD and providers to achieve optimal medication adherence . [facility] o Comprehensive medication review performed. o Continue current medication management strategy o Continue Docusate twice daily and Dulcolax as needed for constipation . Patient self care activities - Over the next 90 days,  patient will: o Focus on medication adherence by continued use of pillbox and calendar reminders o Take medications as prescribed o Report any questions or concerns to PharmD and/or provider(s)  Please see past updates related to this goal by  clicking on the "Past Updates" button in the selected goal         The patient verbalized understanding of instructions provided today and agreed to receive a mailed copy of patient instruction and/or educational materials.  Telephone follow up appointment with pharmacy team member scheduled for: 07/27/20 @ 4:15 PM  Jannette Fogo, PharmD Clinical Pharmacist [facility] [phone]   Hypoglycemia Hypoglycemia is when the sugar (glucose) level in your blood is too low. Signs of low blood sugar may include:  Feeling: ? Hungry. ? Worried or nervous (anxious). ? Sweaty and clammy. ? Confused. ? Dizzy. ? Sleepy. ? Sick to your stomach (nauseous).  Having: ? A fast heartbeat. ? A headache. ? A change in your vision. ? Tingling or no feeling (numbness) around your mouth, lips, or tongue. ? Jerky movements that you cannot control (seizure).  Having trouble with: ? Moving (coordination). ? Sleeping. ? Passing out (fainting). ? Getting upset easily (irritability). Low blood sugar can happen to people who have diabetes and people who do not have diabetes. Low blood sugar can happen quickly, and it can be an emergency. Treating low blood sugar Low blood sugar is often treated by eating or drinking something sugary right away, such as:  Fruit juice, 4-6 oz (120-150 mL).  Regular soda (not diet soda), 4-6 oz (120-150 mL).  Low-fat milk, 4 oz (120 mL).  Several pieces of hard candy.  Sugar or honey, 1 Tbsp (15 mL). Treating low blood sugar if you have diabetes If you can think clearly and swallow safely, follow the 15:15 rule:  Take 15 grams of a fast-acting carb (carbohydrate). Talk with your doctor about how much you should take.  Always keep a source of fast-acting carb with you, such as: ? Sugar tablets (glucose pills). Take 3-4 pills. ? 6-8 pieces of hard candy. ? 4-6 oz (120-150 mL) of fruit juice. ? 4-6 oz (120-150 mL) of regular (not  diet) soda. ? 1 Tbsp (15 mL) honey or sugar.  Check your blood sugar 15 minutes after you take the carb.  If your blood sugar is still at or below 70 mg/dL (3.9 mmol/L), take 15 grams of a carb again.  If your blood sugar does not go above 70 mg/dL (3.9 mmol/L) after 3 tries, get help right away.  After your blood sugar goes back to normal, eat a meal or a snack within 1 hour.  Treating very low blood sugar If your blood sugar is at or below 54 mg/dL (3 mmol/L), you have very low blood sugar (severe hypoglycemia). This may also cause:  Passing out.  Jerky movements you cannot control (seizure).  Losing consciousness (coma). This is an emergency. Do not wait to see if the symptoms will go away. Get medical help right away. Call your local emergency services (911 in the [place] Do not drive yourself to the hospital. If you have very low blood sugar and you cannot eat or drink, you may need a glucagon shot (injection). A family member or friend should learn how to check your blood sugar and how to give you a glucagon shot. Ask your doctor if you need to have a glucagon shot kit at home. Follow these instructions at home: General instructions  Take over-the-counter and prescription medicines only as told by your doctor.  Stay aware of your blood sugar as told by your doctor.  Limit alcohol intake to no more than 1 drink a day for nonpregnant women and 2 drinks a day for men. One drink equals 12 oz of beer (355 mL), 5 oz of wine (148 mL), or 1 oz of hard liquor (44 mL).  Keep all follow-up visits as told by your doctor. This is important. If you have diabetes:   Follow your diabetes care plan as told by your doctor. Make sure you: ? Know the signs of low blood sugar. ? Take your medicines as told. ? Follow your exercise and meal plan. ? Eat on time. Do not skip meals. ? Check your blood sugar as often as told by your doctor. Always check it before and after exercise. ? Follow your  sick day plan when you cannot eat or drink normally. Make this plan ahead of time with your doctor.  Share your diabetes care plan with: ? Your work or school. ? People you live with.  Check your pee (urine) for ketones: ? When you are sick. ? As told by your doctor.  Carry a card or wear jewelry that says you have diabetes. Contact a doctor if:  You have trouble keeping your blood sugar in your target range.  You have low blood sugar often. Get help right away if:  You still have symptoms after you eat or drink something sugary.  Your blood sugar is at or below 54 mg/dL (3 mmol/L).  You have jerky movements that you cannot control.  You pass out. These symptoms may be an emergency. Do not wait to see if the symptoms will go away. Get medical help right away. Call your local emergency services (911 in the [place] Do not drive yourself to the hospital. Summary  Hypoglycemia happens when the level of sugar (glucose) in your blood is too low.  Low blood sugar can happen to people who have diabetes and people who do not have diabetes. Low blood sugar can happen quickly, and it can be an emergency.  Make sure you know the signs of low blood sugar and know how to treat it.  Always keep a source of sugar (fast-acting carb) with you to treat low blood sugar. This information is not intended to replace advice given to you by your health care provider. Make sure you discuss any questions you have with your health care provider. Document Revised: 12/18/2018 Document Reviewed: 09/30/2015 Elsevier Patient Education  2020 [employer].

## 2020-06-21 NOTE — Patient Instructions (Signed)
Visit Information  Goals Addressed              This Visit's Progress     Patient Stated   .  "I don't know what food I can eat" (pt-stated)   On track     Current Barriers:  Marland Kitchen Multiple conditions impacted by diet choices including DM II, CKD III, HTN, and Gout . Limited knowledge of specific foods okay to eat and/or avoid in relation to comorbid conditions   RN Clinical Goal(s):  Marland Kitchen Over the next 180 days, patient will continue to work with the CCM team and PCP for disease education and support for improved Self health management of DM  CCM RN CM Interventions:  06/17/20 call completed with patient  . Evaluation of current treatment plan related to dietary recommendations for DM and patient's adherence to plan as established by provider . Reviewed recent A1c has decreased to 6.0 from 8.1; Positive reinforcement given to patient for making efforts to lower her A1c  . Re-educated patient re: dietary recommendations and exercise recommendations; Educated on 15'15' rule  . Re-educated patient with rationale, to check cbg daily before meals and record, calling the CCM team and or PCP for findings outside established parameters FBS 80-130, <180 after meals  . Confirmed patient received and reviewed the printed educational materials related to Meal Planning Using the Plate Method and Portion Control; 6 Ways to be Water Wise; Elderly Nutrition 101 . Discussed plans with patient for ongoing care management follow up and provided patient with direct contact information for care management team  Patient Self Care Activities:  . Attends all scheduled provider appointments . Performs ADL's independently . Calls provider office for new concerns or questions . Unable to verbalize appropriate diet restrictions in relation to health conditions  Please see past updates related to this goal by clicking on the "Past Updates" button in the selected goal      .  "I have Congestive Heart Failue"  (pt-stated)   On track     Current Barriers:  Marland Kitchen Knowledge Deficits related to disease process and Self health management of CHF . Chronic Disease Management support and education needs related to DM II with Stage III CKD, Hypertensive Nephropathy, Mixed Hyperlipidemia  Nurse Case Manager Clinical Goal(s):  . New 06/17/20 Over the next 180 days, patient will continue to work with the CCM team and PCP for disease education and support for improved Self health management of CHF  CCM RN CM Interventions:  06/17/20 call completed with patient . Evaluation of current treatment plan related to CHF and patient's adherence to plan as established by provider . Determined patient continues to have her CHF managed by Dr. Gwenlyn Found, Cardiologist, discussed recent OV f/u: o ASSESSMENT AND PLAN:  o Essential hypertension o History of essential hypertension with blood pressure measured today 120/62.  She is on amlodipine, hydralazine and metoprolol as well as Micardis. o Bilateral lower extremity edema o History of bilateral lower extreme edema probably related to diastolic dysfunction on oral diuretics.  She has 1-2+ left ankle edema today. o Hyperlipidemia o History of hyperlipidemia on statin therapy with lipid profile performed 12/29/2019 revealing total cholesterol 165, LDL 71 and HDL of 78. o Acute on chronic diastolic CHF (congestive heart failure) (HCC) o History of chronic diastolic heart failure with normal LV systolic function by echo recently checked 04/19/2020 oral diuretics. Marland Kitchen  Re-educated patient with rationale, to weigh daily and record, calling the CCM team and MD [facility] or  [facility]) for weight gain of 3lbs overnight or 5 pounds in a week . Discussed plans with patient for ongoing care management follow up and provided patient with direct contact information for care management team  Patient Self Care Activities:  . Self administers medications as prescribed . Attends all scheduled provider  appointments . Calls pharmacy for medication refills . Calls provider office for new concerns or questions  Please see past updates related to this goal by clicking on the "Past Updates" button in the selected goal      .  "to get my gout under better control" (pt-stated)        CARE PLAN ENTRY (see longitudinal plan of care for additional care plan information)  Current Barriers:  Marland Kitchen Knowledge Deficits related to disease process and Self health management  . Chronic Disease Management support and education needs related to DM II with Stage III CKD, Hypertensive Nephropathy, Mixed Hyperlipidemia  Nurse Case Manager Clinical Goal(s):  Marland Kitchen Over the next 90 days, patient will work with the CCM team and PCP to address needs related to disease education and support for improved Self health management of Gout   CCM RN CM Interventions:  06/17/20 call completed with patient  . Inter-disciplinary care team collaboration (see longitudinal plan of care) . Evaluation of current treatment plan related to Gout and patient's adherence to plan as established by provider . Reviewed medications with patient and discussed patient is taking allopurinol 200 mg p.o.daily as directed for increased Uric Acid  . Discussed plans with patient for ongoing care management follow up and provided patient with direct contact information for care management team . Provided patient with printed educational materials related to Gout   Patient Self Care Activities:  . Self administers medications as prescribed . Attends all scheduled provider appointments . Calls pharmacy for medication refills . Calls provider office for new concerns or questions  Initial goal documentation       Patient verbalizes understanding of instructions provided today.   Telephone follow up appointment with care management team member scheduled for: 09/01/20  Barb Merino, RN, BSN, CCM Care Management Coordinator [place]  [facility]  Direct Phone: [phone]

## 2020-06-21 NOTE — Chronic Care Management (AMB) (Signed)
Chronic Care Management   Follow Up Note   06/17/2020 Name: Maria [name] MRN: [id number] DOB: October 08, 1925  Referred by: Glendale Chard, MD Reason for referral : Chronic Care Management (CCM RNCM RU Call )   Maria [name] is a 86 y.o. year old female who is a primary care patient of Glendale Chard, MD. The CCM team was consulted for assistance with chronic disease management and care coordination needs.    Review of patient status, including review of consultants reports, relevant laboratory and other test results, and collaboration with appropriate care team members and the patient's provider was performed as part of comprehensive patient evaluation and provision of chronic care management services.    SDOH (Social Determinants of Health) assessments performed: Yes - no acute challenges identified at this time See Care Plan activities for detailed interventions related to [place])   Placed outbound CCM RN CM follow up call to patient for a care plan update.    Outpatient Encounter Medications as of 06/17/2020  Medication Sig  . Accu-Chek FastClix Lancets MISC CHECK BLOOD SUGAR BEFORE BREAKFAST AND DINNER  . ACCU-CHEK SMARTVIEW test strip TEST TWICE DAILY BEFORE BREAKFAST AND DINNER  . allopurinol (ZYLOPRIM) 100 MG tablet Take 2 tablets (200 mg total) by mouth daily.  Marland Kitchen amLODipine (NORVASC) 5 MG tablet TAKE ONE-HALF (1/2) TABLET DAILY (Patient taking differently: 5 mg daily. )  . aspirin 81 MG tablet Take 1 tablet (81 mg total) by mouth daily.  Marland Kitchen atorvastatin (LIPITOR) 40 MG tablet Take 1 tablet (40 mg total) by mouth daily. (Patient taking differently: Take 40 mg by mouth daily. Monday, Wednesday, and Friday)  . Cholecalciferol (VITAMIN D) 2000 UNITS tablet Take 2,000 Units by mouth daily.  . citalopram (CELEXA) 20 MG tablet Take 20 mg by mouth daily.   . diclofenac Sodium (VOLTAREN) 1 % GEL Apply 2 g topically 4 (four) times daily.  Marland Kitchen donepezil (ARICEPT) 10 MG tablet TAKE 1 TABLET  DAILY IN THE EVENING  . ezetimibe (ZETIA) 10 MG tablet TAKE 1 TABLET AT BEDTIME  . ferrous sulfate 325 (65 FE) MG EC tablet Take 1 tablet (325 mg total) by mouth 2 (two) times daily. (Patient taking differently: Take 325 mg by mouth daily. )  . FOLBIC 2.5-25-2 MG TABS tablet TAKE 1 TABLET DAILY  . furosemide (LASIX) 40 MG tablet TAKE 1 TABLET TWICE A DAY (NEED TO SCHEDULE AN APPOINTMENT)  . hydrALAZINE (APRESOLINE) 25 MG tablet TAKE 1 TABLET THREE TIMES A DAY  . hydroxychloroquine (PLAQUENIL) 200 MG tablet Take 1 tablet (200 mg total) by mouth daily.  . insulin glargine, 1 Unit Dial, (TOUJEO SOLOSTAR) 300 UNIT/ML Solostar Pen Inject 19 Units into the skin at bedtime. (Patient taking differently: Inject 17 Units into the skin at bedtime. )  . Insulin Pen Needle (BD PEN NEEDLE MICRO U/F) 32G X 6 MM MISC Use as directed  . isosorbide mononitrate (IMDUR) 30 MG 24 hr tablet TAKE 1 TABLET DAILY  . loratadine (CLARITIN) 10 MG tablet Take 10 mg by mouth every morning.  . metolazone (ZAROXOLYN) 2.5 MG tablet TAKE 1 TABLET EVERY OTHER DAY 30 MINUTES PRIOR TO YOUR MORNING LASIX DOSE  . metoprolol succinate (TOPROL-XL) 25 MG 24 hr tablet TAKE 1 TABLET(25 MG) BY MOUTH DAILY  . potassium chloride (KLOR-CON) 10 MEQ tablet TAKE 1 TABLET TWICE A DAY  . predniSONE (DELTASONE) 5 MG tablet Take 4 tabs po x 4 days, 3  tabs po x 4 days, 2  tabs po x  4 days, 1  tab po x 4 days  . Probiotic Product (ALIGN) 4 MG CAPS Take 1 capsule by mouth daily.  . Semaglutide,0.25 or 0.5MG /DOS, (OZEMPIC, 0.25 OR 0.5 MG/DOSE,) 2 MG/1.5ML SOPN Inject 0.375 mLs (0.5 mg total) into the skin once a week. Inject 0.5mg  into skin once weekly.  Marland Kitchen telmisartan (MICARDIS) 80 MG tablet TAKE 1 TABLET DAILY   No facility-administered encounter medications on file as of 06/17/2020.     Objective:  Lab Results  Component Value Date   HGBA1C 6.0 (H) 06/13/2020   HGBA1C 8.1 (H) 03/07/2020   HGBA1C 8.6 (H) 01/06/2020   Lab Results  Component  Value Date   MICROALBUR 10 09/15/2019   LDLCALC 71 01/06/2020   CREATININE 2.23 (H) 06/02/2020   BP Readings from Last 3 Encounters:  06/13/20 112/66  06/07/20 111/67  06/02/20 110/74    Goals Addressed      Patient Stated   .  "I don't know what food I can eat" (pt-stated)   On track     Current Barriers:  Marland Kitchen Multiple conditions impacted by diet choices including DM II, CKD III, HTN, and Gout . Limited knowledge of specific foods okay to eat and/or avoid in relation to comorbid conditions   RN Clinical Goal(s):  Marland Kitchen Over the next 180 days, patient will continue to work with the CCM team and PCP for disease education and support for improved Self health management of DM  CCM RN CM Interventions:  06/17/20 call completed with patient  . Evaluation of current treatment plan related to dietary recommendations for DM and patient's adherence to plan as established by provider . Reviewed recent A1c has decreased to 6.0 from 8.1; Positive reinforcement given to patient for making efforts to lower her A1c  . Re-educated patient re: dietary recommendations and exercise recommendations; Educated on 15'15' rule  . Re-educated patient with rationale, to check cbg daily before meals and record, calling the CCM team and or PCP for findings outside established parameters FBS 80-130, <180 after meals  . Confirmed patient received and reviewed the printed educational materials related to Meal Planning Using the Plate Method and Portion Control; 6 Ways to be Water Wise; Elderly Nutrition 101 . Discussed plans with patient for ongoing care management follow up and provided patient with direct contact information for care management team  Patient Self Care Activities:  . Attends all scheduled provider appointments . Performs ADL's independently . Calls provider office for new concerns or questions . Unable to verbalize appropriate diet restrictions in relation to health conditions  Please see past  updates related to this goal by clicking on the "Past Updates" button in the selected goal      .  "I have Congestive Heart Failue" (pt-stated)   On track     Current Barriers:  Marland Kitchen Knowledge Deficits related to disease process and Self health management of CHF . Chronic Disease Management support and education needs related to DM II with Stage III CKD, Hypertensive Nephropathy, Mixed Hyperlipidemia  Nurse Case Manager Clinical Goal(s):  . New 06/17/20 Over the next 180 days, patient will continue to work with the CCM team and PCP for disease education and support for improved Self health management of CHF  CCM RN CM Interventions:  06/17/20 call completed with patient . Evaluation of current treatment plan related to CHF and patient's adherence to plan as established by provider . Determined patient continues to have her CHF managed by Dr. Gwenlyn Found, Cardiologist, discussed recent  OV f/u: o ASSESSMENT AND PLAN:  o Essential hypertension o History of essential hypertension with blood pressure measured today 120/62.  She is on amlodipine, hydralazine and metoprolol as well as Micardis. o Bilateral lower extremity edema o History of bilateral lower extreme edema probably related to diastolic dysfunction on oral diuretics.  She has 1-2+ left ankle edema today. o Hyperlipidemia o History of hyperlipidemia on statin therapy with lipid profile performed 12/29/2019 revealing total cholesterol 165, LDL 71 and HDL of 78. o Acute on chronic diastolic CHF (congestive heart failure) (HCC) o History of chronic diastolic heart failure with normal LV systolic function by echo recently checked 04/19/2020 oral diuretics. Marland Kitchen  Re-educated patient with rationale, to weigh daily and record, calling the CCM team and MD Baird Cancer or Gwenlyn Found) for weight gain of 3lbs overnight or 5 pounds in a week . Discussed plans with patient for ongoing care management follow up and provided patient with direct contact information for care  management team  Patient Self Care Activities:  . Self administers medications as prescribed . Attends all scheduled provider appointments . Calls pharmacy for medication refills . Calls provider office for new concerns or questions  Please see past updates related to this goal by clicking on the "Past Updates" button in the selected goal      .  "to get my gout under better control" (pt-stated)        CARE PLAN ENTRY (see longitudinal plan of care for additional care plan information)  Current Barriers:  Marland Kitchen Knowledge Deficits related to disease process and Self health management  . Chronic Disease Management support and education needs related to DM II with Stage III CKD, Hypertensive Nephropathy, Mixed Hyperlipidemia  Nurse Case Manager Clinical Goal(s):  Marland Kitchen Over the next 90 days, patient will work with the CCM team and PCP to address needs related to disease education and support for improved Self health management of Gout   CCM RN CM Interventions:  06/17/20 call completed with patient  . Inter-disciplinary care team collaboration (see longitudinal plan of care) . Evaluation of current treatment plan related to Gout and patient's adherence to plan as established by provider . Reviewed medications with patient and discussed patient is taking allopurinol 200 mg p.o.daily as directed for increased Uric Acid  . Discussed plans with patient for ongoing care management follow up and provided patient with direct contact information for care management team . Provided patient with printed educational materials related to Gout   Patient Self Care Activities:  . Self administers medications as prescribed . Attends all scheduled provider appointments . Calls pharmacy for medication refills . Calls provider office for new concerns or questions  Initial goal documentation       Plan:   Telephone follow up appointment with care management team member scheduled for: 09/01/20  Barb Merino, RN, BSN, CCM Care Management Coordinator [place] [facility]  Direct Phone: [phone]

## 2020-06-30 ENCOUNTER — Ambulatory Visit: Payer: Medicare Other | Admitting: Neurology

## 2020-06-30 ENCOUNTER — Ambulatory Visit (INDEPENDENT_AMBULATORY_CARE_PROVIDER_SITE_OTHER): Payer: Medicare Other | Admitting: Neurology

## 2020-06-30 DIAGNOSIS — Z0289 Encounter for other administrative examinations: Secondary | ICD-10-CM

## 2020-06-30 DIAGNOSIS — R202 Paresthesia of skin: Secondary | ICD-10-CM

## 2020-06-30 DIAGNOSIS — G5603 Carpal tunnel syndrome, bilateral upper limbs: Secondary | ICD-10-CM | POA: Diagnosis not present

## 2020-06-30 DIAGNOSIS — M6281 Muscle weakness (generalized): Secondary | ICD-10-CM

## 2020-06-30 DIAGNOSIS — M79641 Pain in right hand: Secondary | ICD-10-CM

## 2020-06-30 DIAGNOSIS — R200 Anesthesia of skin: Secondary | ICD-10-CM

## 2020-06-30 NOTE — Progress Notes (Signed)
See procedure note.

## 2020-06-30 NOTE — Progress Notes (Signed)
Full Name: Maria [name] Gender: Female MRN #: [id number] Date of Birth: Jan 20, 1926    Visit Date: 06/30/2020 09:41 Age: 86 Years Examining Physician: Sarina Ill, MD  Requesting Provider: Marcelle Smiling, MD Primary Care Provider:  Glendale Chard, MD    History: Numbness in the left hand. Right hand less affected.   Summary:  Nerve Conduction Studies were performed on the bilateral upper extremities.   The right median APB motor nerve showed prolonged distal onset latency (6.1 ms, N<4.4) and reduced amplitude(1.37mV, N>4). The left  median APB motor nerve showed prolonged distal onset latency (5.2 ms, N<4.4). The right Median 2nd Digit orthodromic sensory nerve showed no response. The left  Median 2nd Digit orthodromic sensory nerve showed no response. The left median/ulnar (palm) comparison nerve showed prolonged distal peak latency (Median Palm, 4.0 ms, N<2.2) and abnormal peak latency difference (Median Palm-Ulnar Palm, 1.5 ms, N<0.4) with a relative median delay.  The right median/ulnar (palm) comparison nerve showed prolonged distal peak latency (Median Palm, 3.6 ms, N<2.2) and abnormal peak latency difference (Median Palm-Ulnar Palm, 1.2 ms, N<0.4) with a relative median delay.     Conclusion:  This is an abnormal study. There is electrophysiologic evidence of bilateral moderately-severe  left > right Carpal Tunnel Syndrome.  No suggestion of radiculopathy.   Sarina Ill M.D.  [facility] [place], [place] [address] Tel: [phone] Fax: [phone]  Verbal informed consent was obtained from the patient, patient was informed of potential risk of procedure, including bruising, bleeding, hematoma formation, infection, muscle weakness, muscle pain, numbness, among others.         [place]    Nerve / Sites Muscle Latency Ref. Amplitude Ref. Rel Amp Segments Distance Velocity Ref. Area    ms ms mV mV %  cm m/s m/s mVms  L Median - APB     Wrist  APB 5.2 ?4.4 4.5 ?4.0 100 Wrist - APB 7   19.5     Upper arm APB 9.3  4.2  91.7 Upper arm - Wrist 20 49 ?49 18.9  R Median - APB     Wrist APB 6.1 ?4.4 1.4 ?4.0 100 Wrist - APB 7   4.0     Upper arm APB 10.3  3.3  230 Upper arm - Wrist 21 50 ?49 21.4     Ulnar wrist APB 4.2  2.4  72.4 Ulnar wrist - Upper arm    4.4  L Ulnar - ADM     Wrist ADM 2.6 ?3.3 6.5 ?6.0 100 Wrist - ADM 7   20.7     B.Elbow ADM 6.5  5.8  89.1 B.Elbow - Wrist 19 49 ?49 19.6     A.Elbow ADM 8.1  5.6  96.5 A.Elbow - B.Elbow 8 49 ?49 19.9         A.Elbow - Wrist               SNC    Nerve / Sites Rec. Site Peak Lat Ref.  Amp Ref. Segments Distance Peak Diff Ref.    ms ms V V  cm ms ms  L Median, Ulnar - Transcarpal comparison     Median Palm Wrist 4.0 ?2.2 11 ?35 Median Palm - Wrist 8       Ulnar Palm Wrist 2.5 ?2.2 16 ?12 Ulnar Palm - Wrist 8          Median Palm - Ulnar Palm  1.5 ?0.4  R Median, Ulnar -  Transcarpal comparison     Median Palm Wrist 3.6 ?2.2 12 ?35 Median Palm - Wrist 8       Ulnar Palm Wrist 2.4 ?2.2 21 ?12 Ulnar Palm - Wrist 8          Median Palm - Ulnar Palm  1.2 ?0.4  L Median - Orthodromic (Dig II, Mid palm)     Dig II Wrist NR ?3.4 NR ?10 Dig II - Wrist 13    R Median - Orthodromic (Dig II, Mid palm)     Dig II Wrist NR ?3.4 NR ?10 Dig II - Wrist 13    L Ulnar - Orthodromic, (Dig V, Mid palm)     Dig V Wrist 3.1 ?3.1 5 ?5 Dig V - Wrist 35                 F  Wave    Nerve F Lat Ref.   ms ms  L Ulnar - ADM 31.3 ?32.0       EMG Summary Table    Spontaneous MUAP Recruitment  Muscle IA Fib PSW Fasc Other Amp Dur. Poly Pattern  L. Deltoid Normal None None None _______ Normal Normal Normal Normal  L. Triceps brachii Normal None None None _______ Normal Normal Normal Normal  L. Pronator teres Normal None None None _______ Normal Normal Normal Normal  L. Opponens pollicis Normal None None None _______ Normal Normal Normal Normal  L. First dorsal interosseous Normal None None None  _______ Normal Normal Normal Normal

## 2020-07-03 NOTE — Addendum Note (Signed)
Addended by: Sarina Ill B on: 07/03/2020 01:26 PM   Modules accepted: Orders

## 2020-07-03 NOTE — Procedures (Signed)
Full Name: Maria [name] Gender: Female MRN #: [id number] Date of Birth: [birth year]    Visit Date: 06/30/2020 09:41 Age: 86 Years Examining Physician: Sarina Ill, MD  Requesting Provider: Marcelle Smiling, MD Primary Care Provider:  Glendale Chard, MD    History: Numbness in the left hand. Right hand less affected.   Summary:  Nerve Conduction Studies were performed on the bilateral upper extremities.   The right median APB motor nerve showed prolonged distal onset latency (6.1 ms, N<4.4) and reduced amplitude(1.47mV, N>4). The left  median APB motor nerve showed prolonged distal onset latency (5.2 ms, N<4.4). The right Median 2nd Digit orthodromic sensory nerve showed no response. The left  Median 2nd Digit orthodromic sensory nerve showed no response. The left median/ulnar (palm) comparison nerve showed prolonged distal peak latency (Median Palm, 4.0 ms, N<2.2) and abnormal peak latency difference (Median Palm-Ulnar Palm, 1.5 ms, N<0.4) with a relative median delay.  The right median/ulnar (palm) comparison nerve showed prolonged distal peak latency (Median Palm, 3.6 ms, N<2.2) and abnormal peak latency difference (Median Palm-Ulnar Palm, 1.2 ms, N<0.4) with a relative median delay.     Conclusion:  This is an abnormal study. There is electrophysiologic evidence of bilateral moderately-severe  left > right Carpal Tunnel Syndrome.  No suggestion of radiculopathy.   Sarina Ill M.D.  [facility] [place],  [address] Tel: [phone] Fax: [phone]  Verbal informed consent was obtained from the patient, patient was informed of potential risk of procedure, including bruising, bleeding, hematoma formation, infection, muscle weakness, muscle pain, numbness, among others.         [place]    Nerve / Sites Muscle Latency Ref. Amplitude Ref. Rel Amp Segments Distance Velocity Ref. Area    ms ms mV mV %  cm m/s m/s mVms  L Median - APB     Wrist  APB 5.2 ?4.4 4.5 ?4.0 100 Wrist - APB 7   19.5     Upper arm APB 9.3  4.2  91.7 Upper arm - Wrist 20 49 ?49 18.9  R Median - APB     Wrist APB 6.1 ?4.4 1.4 ?4.0 100 Wrist - APB 7   4.0     Upper arm APB 10.3  3.3  230 Upper arm - Wrist 21 50 ?49 21.4     Ulnar wrist APB 4.2  2.4  72.4 Ulnar wrist - Upper arm    4.4  L Ulnar - ADM     Wrist ADM 2.6 ?3.3 6.5 ?6.0 100 Wrist - ADM 7   20.7     B.Elbow ADM 6.5  5.8  89.1 B.Elbow - Wrist 19 49 ?49 19.6     A.Elbow ADM 8.1  5.6  96.5 A.Elbow - B.Elbow 8 49 ?49 19.9         A.Elbow - Wrist               SNC    Nerve / Sites Rec. Site Peak Lat Ref.  Amp Ref. Segments Distance Peak Diff Ref.    ms ms V V  cm ms ms  L Median, Ulnar - Transcarpal comparison     Median Palm Wrist 4.0 ?2.2 11 ?35 Median Palm - Wrist 8       Ulnar Palm Wrist 2.5 ?2.2 16 ?12 Ulnar Palm - Wrist 8          Median Palm - Ulnar Palm  1.5 ?0.4  R Median, Ulnar -  Transcarpal comparison     Median Palm Wrist 3.6 ?2.2 12 ?35 Median Palm - Wrist 8       Ulnar Palm Wrist 2.4 ?2.2 21 ?12 Ulnar Palm - Wrist 8          Median Palm - Ulnar Palm  1.2 ?0.4  L Median - Orthodromic (Dig II, Mid palm)     Dig II Wrist NR ?3.4 NR ?10 Dig II - Wrist 13    R Median - Orthodromic (Dig II, Mid palm)     Dig II Wrist NR ?3.4 NR ?10 Dig II - Wrist 13    L Ulnar - Orthodromic, (Dig V, Mid palm)     Dig V Wrist 3.1 ?3.1 5 ?5 Dig V - Wrist 3                 F  Wave    Nerve F Lat Ref.   ms ms  L Ulnar - ADM 31.3 ?32.0       EMG Summary Table    Spontaneous MUAP Recruitment  Muscle IA Fib PSW Fasc Other Amp Dur. Poly Pattern  L. Deltoid Normal None None None _______ Normal Normal Normal Normal  L. Triceps brachii Normal None None None _______ Normal Normal Normal Normal  L. Pronator teres Normal None None None _______ Normal Normal Normal Normal  L. Opponens pollicis Normal None None None _______ Normal Normal Normal Normal  L. First dorsal interosseous Normal None None None  _______ Normal Normal Normal Normal

## 2020-07-04 ENCOUNTER — Telehealth: Payer: Self-pay | Admitting: *Deleted

## 2020-07-04 DIAGNOSIS — G5603 Carpal tunnel syndrome, bilateral upper limbs: Secondary | ICD-10-CM

## 2020-07-04 NOTE — Telephone Encounter (Signed)
Attempted to contact the patient and left message for patient to call the office.  

## 2020-07-04 NOTE — Telephone Encounter (Signed)
-----   Message from Bo Merino, MD sent at 07/03/2020  9:36 PM EDT ----- Nerve conduction velocity revealed bilateral moderate to severe carpal tunnel syndrome.  Please advise patient that carpal tunnel release surgery is recommended.  If patient is willing to have surgery we can refer her to Ortho care or her orthopedic surgeon of choice. Thank you, Bo Merino, MD  ----- Message ----- From: Melvenia Beam, MD Sent: 07/03/2020   1:26 PM EDT To: Bo Merino, MD, Roseanne Kaufman, MD

## 2020-07-04 NOTE — Telephone Encounter (Signed)
Patient advised Nerve conduction velocity revealed bilateral moderate to severe carpal tunnel syndrome.  Patient advised that carpal tunnel release surgery is recommended.  If patient is willing to have surgery we can refer her to Ortho care or her orthopedic surgeon of choice.  Patient is in agreement for a referral to Ortho surgeon.

## 2020-07-04 NOTE — Addendum Note (Signed)
Addended by: Carole Binning on: 07/04/2020 04:02 PM   Modules accepted: Orders

## 2020-07-05 ENCOUNTER — Telehealth: Payer: Self-pay

## 2020-07-05 ENCOUNTER — Telehealth: Payer: Medicare Other

## 2020-07-05 ENCOUNTER — Ambulatory Visit: Payer: Self-pay

## 2020-07-05 ENCOUNTER — Other Ambulatory Visit: Payer: Self-pay

## 2020-07-05 DIAGNOSIS — I130 Hypertensive heart and chronic kidney disease with heart failure and stage 1 through stage 4 chronic kidney disease, or unspecified chronic kidney disease: Secondary | ICD-10-CM

## 2020-07-05 DIAGNOSIS — E782 Mixed hyperlipidemia: Secondary | ICD-10-CM

## 2020-07-05 DIAGNOSIS — N183 Chronic kidney disease, stage 3 unspecified: Secondary | ICD-10-CM

## 2020-07-05 DIAGNOSIS — E1122 Type 2 diabetes mellitus with diabetic chronic kidney disease: Secondary | ICD-10-CM

## 2020-07-05 DIAGNOSIS — I5032 Chronic diastolic (congestive) heart failure: Secondary | ICD-10-CM

## 2020-07-05 DIAGNOSIS — M109 Gout, unspecified: Secondary | ICD-10-CM

## 2020-07-05 DIAGNOSIS — Z794 Long term (current) use of insulin: Secondary | ICD-10-CM

## 2020-07-05 NOTE — Telephone Encounter (Signed)
The pt called with her blood sugar readings for fri and Monday 55 and 66, the pt was notified that Dr. Baird Cancer said to decrease her insulin by 3 units.

## 2020-07-07 ENCOUNTER — Other Ambulatory Visit: Payer: Self-pay | Admitting: Internal Medicine

## 2020-07-12 NOTE — Patient Instructions (Signed)
Visit Information  Goals Addressed      Patient Stated   .  "I don't know what food I can eat" (pt-stated)        Current Barriers:  Marland Kitchen Multiple conditions impacted by diet choices including DM II, CKD III, HTN, and Gout . Limited knowledge of specific foods okay to eat and/or avoid in relation to comorbid conditions   RN Clinical Goal(s):  Marland Kitchen Over the next 180 days, patient will continue to work with the CCM team and PCP for disease education and support for improved Self health management of DM  CCM RN CM Interventions:  07/05/20 call completed with patient  . Evaluation of current treatment plan related to dietary recommendations for DM and patient's adherence to plan as established by provider . Reviewed recent A1c has decreased to 6.0 from 8.1; Positive reinforcement given to patient for making efforts to lower her A1c  . Re-educated patient re: dietary recommendations and exercise recommendations; Educated on 15'15' rule  . Re-educated patient with rationale, to check cbg daily before meals and record, calling the CCM team and or PCP for findings outside established parameters FBS 80-130, <180 after meals  . Confirmed patient received and reviewed the printed educational materials related to Meal Planning Using the Plate Method and Portion Control; 6 Ways to be Water Wise; Elderly Nutrition 101 . Discussed plans with patient for ongoing care management follow up and provided patient with direct contact information for care management team  Patient Self Care Activities:  . Attends all scheduled provider appointments . Performs ADL's independently . Calls provider office for new concerns or questions . Unable to verbalize appropriate diet restrictions in relation to health conditions  Please see past updates related to this goal by clicking on the "Past Updates" button in the selected goal      .  "to improve my kidney function" (pt-stated)        CARE PLAN ENTRY (see longitudinal  plan of care for additional care plan information)  Current Barriers:  Marland Kitchen Knowledge Deficits related to disease process and Self Health management of CKD  . Chronic Disease Management support and education needs related to DM II with Stage III CKD, Hypertensive Nephropathy, Mixed Hyperlipidemia, Gout   Nurse Case Manager Clinical Goal(s):  Marland Kitchen Over the next 180 days, patient will work with the CCM team and PCP to address needs related to disease education and support for improved Self Health management of CKD  CCM RN CM Interventions:  07/05/20 call completed with patient  . Inter-disciplinary care team collaboration (see longitudinal plan of care) . Evaluation of current treatment plan related to CKD and patient's adherence to plan as established by provider . Educated patient on current GFR <21; Re-educated on stages of CKD; Re-educated on ways to improve renal function and importance of ongoing monitoring of this condition; Educated on importance of increasing water intake  . Reviewed medications with patient and discussed potential for medication induced renal impairment, especially with increased use of Lasix . Discussed plans with patient for ongoing care management follow up and provided patient with direct contact information for care management team . Provided patient with printed educational materials related to Diabetes and Kidney Disease; The Best Salt Substitute for Kidney Patient's; Eating Right for Chronic Kidney Disease   Patient Self Care Activities:  . Self administers medications as prescribed . Attends all scheduled provider appointments . Calls pharmacy for medication refills . Calls provider office for new concerns or questions  Initial goal documentation       Patient verbalizes understanding of instructions provided today.   Telephone follow up appointment with care management team member scheduled for: 08/15/20  Barb Merino, RN, BSN, CCM Care Management  Coordinator [place] [facility]  Direct Phone: [phone]

## 2020-07-12 NOTE — Chronic Care Management (AMB) (Signed)
Chronic Care Management   Follow Up Note   07/05/2020 Name: Maria [name] MRN: [id number] DOB: Apr 26, 1926  Referred by: Glendale Chard, MD Reason for referral : Chronic Care Management (Inbound call from patient)   Maria [name] is a 86 y.o. year old female who is a primary care patient of Glendale Chard, MD. The CCM team was consulted for assistance with chronic disease management and care coordination needs.    Review of patient status, including review of consultants reports, relevant laboratory and other test results, and collaboration with appropriate care team members and the patient's provider was performed as part of comprehensive patient evaluation and provision of chronic care management services.    SDOH (Social Determinants of Health) assessments performed: Yes - no acute challenges identified  See Care Plan activities for detailed interventions related to [place])   Placed CCM RN CM outbound follow up call to patient for a care plan update.     Outpatient Encounter Medications as of 07/05/2020  Medication Sig  . Accu-Chek FastClix Lancets MISC CHECK BLOOD SUGAR BEFORE BREAKFAST AND DINNER  . ACCU-CHEK SMARTVIEW test strip TEST TWICE DAILY BEFORE BREAKFAST AND DINNER  . allopurinol (ZYLOPRIM) 100 MG tablet Take 2 tablets (200 mg total) by mouth daily.  Marland Kitchen amLODipine (NORVASC) 5 MG tablet TAKE ONE-HALF (1/2) TABLET DAILY (Patient taking differently: 5 mg daily. )  . aspirin 81 MG tablet Take 1 tablet (81 mg total) by mouth daily.  Marland Kitchen atorvastatin (LIPITOR) 40 MG tablet Take 1 tablet (40 mg total) by mouth daily. (Patient taking differently: Take 40 mg by mouth daily. Monday, Wednesday, and Friday)  . Cholecalciferol (VITAMIN D) 2000 UNITS tablet Take 2,000 Units by mouth daily.  . citalopram (CELEXA) 20 MG tablet Take 20 mg by mouth daily.   . diclofenac Sodium (VOLTAREN) 1 % GEL Apply 2 g topically 4 (four) times daily.  Marland Kitchen donepezil (ARICEPT) 10 MG tablet TAKE 1 TABLET  DAILY IN THE EVENING  . ezetimibe (ZETIA) 10 MG tablet TAKE 1 TABLET AT BEDTIME  . ferrous sulfate 325 (65 FE) MG EC tablet Take 1 tablet (325 mg total) by mouth 2 (two) times daily. (Patient taking differently: Take 325 mg by mouth daily. )  . FOLBIC 2.5-25-2 MG TABS tablet TAKE 1 TABLET DAILY  . hydrALAZINE (APRESOLINE) 25 MG tablet TAKE 1 TABLET THREE TIMES A DAY  . hydroxychloroquine (PLAQUENIL) 200 MG tablet Take 1 tablet (200 mg total) by mouth daily.  . insulin glargine, 1 Unit Dial, (TOUJEO SOLOSTAR) 300 UNIT/ML Solostar Pen Inject 19 Units into the skin at bedtime. (Patient taking differently: Inject 14 Units into the skin at bedtime. )  . Insulin Pen Needle (BD PEN NEEDLE MICRO U/F) 32G X 6 MM MISC Use as directed  . isosorbide mononitrate (IMDUR) 30 MG 24 hr tablet TAKE 1 TABLET DAILY  . loratadine (CLARITIN) 10 MG tablet Take 10 mg by mouth every morning.  . metolazone (ZAROXOLYN) 2.5 MG tablet TAKE 1 TABLET EVERY OTHER DAY 30 MINUTES PRIOR TO YOUR MORNING LASIX DOSE  . metoprolol succinate (TOPROL-XL) 25 MG 24 hr tablet TAKE 1 TABLET(25 MG) BY MOUTH DAILY  . potassium chloride (KLOR-CON) 10 MEQ tablet TAKE 1 TABLET TWICE A DAY  . predniSONE (DELTASONE) 5 MG tablet Take 4 tabs po x 4 days, 3  tabs po x 4 days, 2  tabs po x 4 days, 1  tab po x 4 days  . Probiotic Product (ALIGN) 4 MG CAPS Take 1 capsule  by mouth daily.  . Semaglutide,0.25 or 0.5MG /DOS, (OZEMPIC, 0.25 OR 0.5 MG/DOSE,) 2 MG/1.5ML SOPN Inject 0.375 mLs (0.5 mg total) into the skin once a week. Inject 0.5mg  into skin once weekly.  Marland Kitchen telmisartan (MICARDIS) 80 MG tablet TAKE 1 TABLET DAILY  . [DISCONTINUED] furosemide (LASIX) 40 MG tablet TAKE 1 TABLET TWICE A DAY (NEED TO SCHEDULE AN APPOINTMENT)   No facility-administered encounter medications on file as of 07/05/2020.     Objective:  Lab Results  Component Value Date   HGBA1C 6.0 (H) 06/13/2020   HGBA1C 8.1 (H) 03/07/2020   HGBA1C 8.6 (H) 01/06/2020   Lab Results   Component Value Date   MICROALBUR 10 09/15/2019   LDLCALC 71 01/06/2020   CREATININE 2.23 (H) 06/02/2020   BP Readings from Last 3 Encounters:  06/13/20 112/66  06/07/20 111/67  06/02/20 110/74    Goals Addressed      Patient Stated   .  "I don't know what food I can eat" (pt-stated)        Current Barriers:  Marland Kitchen Multiple conditions impacted by diet choices including DM II, CKD III, HTN, and Gout . Limited knowledge of specific foods okay to eat and/or avoid in relation to comorbid conditions   RN Clinical Goal(s):  Marland Kitchen Over the next 180 days, patient will continue to work with the CCM team and PCP for disease education and support for improved Self health management of DM  CCM RN CM Interventions:  07/05/20 call completed with patient  . Evaluation of current treatment plan related to dietary recommendations for DM and patient's adherence to plan as established by provider . Reviewed recent A1c has decreased to 6.0 from 8.1; Positive reinforcement given to patient for making efforts to lower her A1c  . Re-educated patient re: dietary recommendations and exercise recommendations; Educated on 15'15' rule  . Re-educated patient with rationale, to check cbg daily before meals and record, calling the CCM team and or PCP for findings outside established parameters FBS 80-130, <180 after meals  . Confirmed patient received and reviewed the printed educational materials related to Meal Planning Using the Plate Method and Portion Control; 6 Ways to be Water Wise; Elderly Nutrition 101 . Discussed plans with patient for ongoing care management follow up and provided patient with direct contact information for care management team  Patient Self Care Activities:  . Attends all scheduled provider appointments . Performs ADL's independently . Calls provider office for new concerns or questions . Unable to verbalize appropriate diet restrictions in relation to health conditions  Please see past  updates related to this goal by clicking on the "Past Updates" button in the selected goal      .  "to improve my kidney function" (pt-stated)        CARE PLAN ENTRY (see longitudinal plan of care for additional care plan information)  Current Barriers:  Marland Kitchen Knowledge Deficits related to disease process and Self Health management of CKD  . Chronic Disease Management support and education needs related to DM II with Stage III CKD, Hypertensive Nephropathy, Mixed Hyperlipidemia, Gout   Nurse Case Manager Clinical Goal(s):  Marland Kitchen Over the next 180 days, patient will work with the CCM team and PCP to address needs related to disease education and support for improved Self Health management of CKD  CCM RN CM Interventions:  07/05/20 call completed with patient  . Inter-disciplinary care team collaboration (see longitudinal plan of care) . Evaluation of current treatment plan related to  CKD and patient's adherence to plan as established by provider . Educated patient on current GFR <21; Re-educated on stages of CKD; Re-educated on ways to improve renal function and importance of ongoing monitoring of this condition; Educated on importance of increasing water intake  . Reviewed medications with patient and discussed potential for medication induced renal impairment, especially with increased use of Lasix . Discussed plans with patient for ongoing care management follow up and provided patient with direct contact information for care management team . Provided patient with printed educational materials related to Diabetes and Kidney Disease; The Best Salt Substitute for Kidney Patient's; Eating Right for Chronic Kidney Disease   Patient Self Care Activities:  . Self administers medications as prescribed . Attends all scheduled provider appointments . Calls pharmacy for medication refills . Calls provider office for new concerns or questions  Initial goal documentation       Plan:   Telephone  follow up appointment with care management team member scheduled for: 08/15/20  Barb Merino, RN, BSN, CCM Care Management Coordinator [place] [facility]  Direct Phone: [phone]

## 2020-07-14 ENCOUNTER — Telehealth: Payer: Self-pay

## 2020-07-14 ENCOUNTER — Telehealth: Payer: Medicare Other

## 2020-07-14 NOTE — Telephone Encounter (Signed)
  Chronic Care Management   Outreach Note  07/14/2020 Name: Maria [name] MRN: [id number] DOB: 27-Feb-1926  Referred by: Glendale Chard, MD Reason for referral : Care Coordination   An unsuccessful telephone outreach was attempted today. The patient was referred to the case management team for assistance with care management and care coordination.   Follow Up Plan: A HIPAA compliant phone message was left for the patient providing contact information and requesting a return call.  The care management team will reach out to the patient again over the next 28 days.   Daneen Schick, BSW, CDP Social Worker, Certified Dementia Practitioner [place] [facility] [place] [facility] [phone]

## 2020-07-18 DIAGNOSIS — D509 Iron deficiency anemia, unspecified: Secondary | ICD-10-CM | POA: Diagnosis not present

## 2020-07-18 DIAGNOSIS — N1832 Chronic kidney disease, stage 3b: Secondary | ICD-10-CM | POA: Diagnosis not present

## 2020-07-25 ENCOUNTER — Telehealth: Payer: Self-pay

## 2020-07-25 NOTE — Telephone Encounter (Signed)
The pt was told that that Dr. Baird Cancer said to take 6 units of Toujeo tonight because the pt's blood sugar was 195 this morning.  The pt was told to call with her morning fasting sugar reading.

## 2020-07-27 ENCOUNTER — Other Ambulatory Visit: Payer: Self-pay

## 2020-07-27 ENCOUNTER — Telehealth: Payer: Self-pay

## 2020-07-27 NOTE — Telephone Encounter (Signed)
The pt was told notified that Dr Baird Cancer said for the pt to continue with her 6 units of Tougeo.  The pt called with her sugars for yesterday am 95, pm 14, this am 90.

## 2020-07-29 ENCOUNTER — Telehealth: Payer: Self-pay

## 2020-07-29 NOTE — Telephone Encounter (Signed)
I left a detailed message at the pt's request.

## 2020-07-29 NOTE — Telephone Encounter (Signed)
Patient notified

## 2020-07-29 NOTE — Telephone Encounter (Signed)
The pt said her sugar this am 82 and that she didn't really have an appetite yesterday but that she ate soup and had some milk and that she feels weak today, she has a little appetiti this morning and she will eat some oat meal, a little orange juice and coffee.  The pt wants to know if she can drink cherry juice sometimes.  The pt was told that Dr. Baird Cancer is off today and I would call her back on Monday.  The pt said that she is on 6 units of insulin.

## 2020-07-29 NOTE — Telephone Encounter (Signed)
-----   Message from Glendale Chard, MD sent at 07/28/2020  7:41 PM EST ----- Pls call her Friday - how is she feeling? Is she feeling better now that her sugars are higher?  Pls confirm number of units of insulin she is using.   RS ----- Message ----- From: Michelle Nasuti, [place] Sent: 07/28/2020   4:41 PM EST To: Glendale Chard, MD  The pt called with her morning fasting sugar of 100 for today.

## 2020-07-29 NOTE — Telephone Encounter (Signed)
Please tell pt to stop the insulin immediately. Keep log of sugars over the weekend. Tell her to stay hydrated

## 2020-08-01 ENCOUNTER — Telehealth: Payer: Self-pay

## 2020-08-01 NOTE — Telephone Encounter (Signed)
The pt left a message with her sugar readings Friday am 82, pm 151 Saturday 115, pm 190, Sunday am 100, pm 111, This am 113.

## 2020-08-01 NOTE — Telephone Encounter (Signed)
Advise her to stay off of insulin for now. Keep track of blood sugars. Does she feel better with higher sugar readings?

## 2020-08-02 ENCOUNTER — Ambulatory Visit: Payer: Medicare Other

## 2020-08-02 ENCOUNTER — Other Ambulatory Visit: Payer: Self-pay

## 2020-08-02 DIAGNOSIS — N183 Chronic kidney disease, stage 3 unspecified: Secondary | ICD-10-CM

## 2020-08-02 DIAGNOSIS — E782 Mixed hyperlipidemia: Secondary | ICD-10-CM

## 2020-08-02 NOTE — Telephone Encounter (Signed)
You can advise her to stop for one week. Her sugars will go up. She can then start back at 0.25mg  once weekly. At the lower dose, she may require some insulin.

## 2020-08-02 NOTE — Chronic Care Management (AMB) (Signed)
Chronic Care Management Pharmacy  Name: Maria [name]  MRN: [id number] DOB: 11/12/1925  Chief Complaint/ HPI  Maria [name],  86 y.o. , female presents for their Follow-Up CCM visit with the clinical pharmacist via telephone. She has an aide Tanya who puts her medication together. She also does most of the house work for Maria [name]. She is originally from [place] and retired from [employer] and was there for over 28 years. She is a widow and was married for over 21 years. She reports that the holidays are hard for her, sometimes she gets a bit depressed and sad but she gets through it. She has a grand daughter that lives in [place], [place] that is a Higher education careers adviser.   PCP : Glendale Chard, MD  Their chronic conditions include: Hypertension, CHF, Diabetes, CKD, Hyperlipidemia and Gout  Current Diagnosis/Assessment:  Goals Addressed            This Visit's Progress   . Pharmacy Care Plan       CARE PLAN ENTRY (see longitudinal plan of care for additional care plan information)  Current Barriers:  . Chronic Disease Management support, education, and care coordination needs related to Hypertension, Hyperlipidemia, Diabetes, and Heart Failure   Hypertension BP Readings from Last 3 Encounters:  06/13/20 112/66  06/07/20 111/67  06/02/20 110/74   . Pharmacist Clinical Goal(s): o Over the next 90 days, patient will work with PharmD and providers to maintain BP goal <130/80 . Current regimen:  o Amlodipine 5mg  1/2 tablet daily o Telmisartan 80mg  daily . Interventions: o Dietary recommendations provided (limit salt intake) o Recommend patient check blood pressure more consistently . Patient self care activities - Over the next 90 days, patient will: o Check BP 1-2 times weekly and if symptomatic, document, and provide at future appointments o Ensure daily salt intake < 2300 mg/day  Hyperlipidemia Lab Results  Component Value Date/Time   LDLCALC 71 01/06/2020 12:03 PM    . Pharmacist Clinical Goal(s): o Over the next 90 days, patient will work with PharmD and providers to maintain LDL goal < 70 . Current regimen:  o Atorvastatin 40mg  three times weekly o Ezetimibe 10mg  daily . Interventions: o Provided dietary and exercise recommendations o Discussed cholesterol levels are great . Patient self care activities - Over the next 90 days, patient will: o Take cholesterol medication as directed o Exercise as able  Diabetes Lab Results  Component Value Date/Time   HGBA1C 6.0 (H) 06/13/2020 10:41 AM   HGBA1C 8.1 (H) 03/07/2020 11:01 AM   . Pharmacist Clinical Goal(s): o Over the next 90 days, patient will work with PharmD and providers to maintain A1c goal <7% . Current regimen:  o Ozempic 0.5 mg once weekly  . Interventions: o Discussed current diabetes treatment regimen o Reviewed recent blood sugar readings o Assessed for symptoms of low blood sugar, patient denies o Discussed eating small meals/portions when appetite is low o Will eventually decrease insulin as patient's blood sugar comes down after completing prednisone . Patient self care activities - Over the next 90 days, patient will: o Check blood sugar twice daily, document, and provide at future appointments o Contact provider with any episodes of hypoglycemia o Stop taking Toujeo 17 units o Continue taking Ozempic 0.5 mg once a week   Heart Failure . Pharmacist Clinical Goal(s) o Over the next 90 days, patient will work with PharmD and providers to reduce symptoms associated with heart failure . Current regimen:  Furosemide 40mg  twice daily   Hydralazine 25mg  three times daily  Isosorbide mononitrate ER 30mg  daily  Metolazone 2.5mg  every other day prior to morning Lasix dose  Metoprolol Succinate XL 25mg  daily  Potassium 79mEq twice daily . Interventions: o Encouraged patient to exercise as able . Patient self care activities - Over the next 90 days, patient  will: o Weigh daily and record to check for fluid buildup o Notify PCP of significant shortness of breath  Medication management . Pharmacist Clinical Goal(s): o Over the next 90 days, patient will work with PharmD and providers to achieve optimal medication adherence . Current pharmacy: [facility] . Interventions o Comprehensive medication review performed. o Continue current medication management strategy o Continue Docusate twice daily and Dulcolax as needed for constipation . Patient self care activities - Over the next 90 days, patient will: o Focus on medication adherence by continued use of pillbox and calendar reminders o Take medications as prescribed o Report any questions or concerns to PharmD and/or provider(s)  Please see past updates related to this goal by clicking on the "Past Updates" button in the selected goal        Hypertension   BP goal is:  <140/90  Office blood pressures are  BP Readings from Last 3 Encounters:  08/17/20 (!) 153/73  09/02/2020 109/70  06/13/20 112/66   Patient checks BP at home daily Patient home BP readings are ranging:  125/79,  Patient has failed these meds in the past: None Patient is currently controlled on the following medications:  . Amlodipine 5 MG - Take daily . Metoprolol Succinate 25 MG - Take daily . Telmisartan 80 MG- Take daily . Isosorbide Mononitrate- Take daily . Hydralazine 25 MG - Take 1 three times daily   We discussed:  Checking her BP at least once per day              Low salt diet               Plan  Continue current medications      Diabetes   Recent Relevant Labs: Lab Results  Component Value Date/Time   HGBA1C 6.0 (H) 06/13/2020 10:41 AM   HGBA1C 8.1 (H) 03/07/2020 11:01 AM   MICROALBUR 10 09/15/2019 12:20 PM   MICROALBUR 10 03/03/2019 12:52 PM    Checking BG: 2x per Day  Recent FBG Readings: 82, 115, 100, 113, 111 Patient has failed these meds in past:  Lantus, Toujeo  Patient is currently uncontrolled on the following medications:   Ozempic 0.5mg  weekly on Thursdays  We discussed  Patient should stop taking Toujeo 17 units   HgbA1c is down to 6%. Congratulated patient on big improvement!  Denies symptoms of hypoglycemia  Pt states her appetite is coming back  She reports eating a bit more in moderation   Plan Continue current medications  Patient to discontinue insulin per her conversation with Auburn Bilberry at the clinic   Anemia   Patient has failed these meds in past: N/A Patient is currently controlled on the following medications:   Ferrous sulfate 325mg  twice daily  We discussed:    Pt is only taking ferrous sulfate once daily right now  Pt states that constipation has been getting better  Stool softener she is taking is helping  Plan Continue current medications   Vaccines   Reviewed and discussed patient's vaccination history.    Immunization History  Administered Date(s) Administered  . Fluad Quad(high Dose  65+) 06/13/2020  . Influenza, High Dose Seasonal PF 06/25/2018, 06/03/2019  . PFIZER SARS-COV-2 Vaccination 11/24/2019, 12/15/2019  . Pneumococcal Conjugate-13 01/26/2015    Plan  Recommended patient receive Shingrix vaccine at the office. It is a two part series  .   Follow up: 8 week phone visit  Orlando Penner, PharmD Clinical Pharmacist [facility] [phone]

## 2020-08-02 NOTE — Telephone Encounter (Signed)
The pt said her sugar last night was 188 and that she just doesn't have any appetite and that she is just weak but that she has been trying to eat. The pt wants to know if she is to continue with the Ozempic 0.5 mg?

## 2020-08-03 DIAGNOSIS — N2581 Secondary hyperparathyroidism of renal origin: Secondary | ICD-10-CM | POA: Diagnosis not present

## 2020-08-03 DIAGNOSIS — I129 Hypertensive chronic kidney disease with stage 1 through stage 4 chronic kidney disease, or unspecified chronic kidney disease: Secondary | ICD-10-CM | POA: Diagnosis not present

## 2020-08-03 DIAGNOSIS — D631 Anemia in chronic kidney disease: Secondary | ICD-10-CM | POA: Diagnosis not present

## 2020-08-03 DIAGNOSIS — N1832 Chronic kidney disease, stage 3b: Secondary | ICD-10-CM | POA: Diagnosis not present

## 2020-08-03 DIAGNOSIS — E8770 Fluid overload, unspecified: Secondary | ICD-10-CM | POA: Diagnosis not present

## 2020-08-08 ENCOUNTER — Telehealth: Payer: Self-pay

## 2020-08-08 NOTE — Telephone Encounter (Signed)
The pt said that Dr. Wilford Sports her kidney doctor said that she has anemia, he stopped the telmisartan, restricted her water and salt intake, the pt said that is probably why she was dizzy, bp was 114/69 for today, her sugar was 112 today.  The pt said that she will call back when she gets her appt to go and get her transfusion at [facility] because the kidney doctor said he was setting her pt an appt there and she is back taking her iron.

## 2020-08-08 NOTE — Telephone Encounter (Signed)
Yes she was told to stop her insulin.

## 2020-08-08 NOTE — Telephone Encounter (Signed)
She will feel better soon now! She has been advised to stop the insulin correct?

## 2020-08-09 ENCOUNTER — Telehealth: Payer: Self-pay

## 2020-08-09 ENCOUNTER — Encounter: Payer: Self-pay | Admitting: Orthopaedic Surgery

## 2020-08-09 ENCOUNTER — Ambulatory Visit: Payer: Medicare Other | Admitting: Orthopaedic Surgery

## 2020-08-09 ENCOUNTER — Ambulatory Visit: Payer: Self-pay

## 2020-08-09 VITALS — Ht 59.0 in | Wt 152.0 lb

## 2020-08-09 DIAGNOSIS — G5602 Carpal tunnel syndrome, left upper limb: Secondary | ICD-10-CM

## 2020-08-09 DIAGNOSIS — M1711 Unilateral primary osteoarthritis, right knee: Secondary | ICD-10-CM | POA: Diagnosis not present

## 2020-08-09 NOTE — Progress Notes (Signed)
Office Visit Note   Patient: Maria [name]           Date of Birth: 1926/03/25           MRN: [id number] Visit Date: 08/09/2020              Requested by: Bo Merino, MD [address],  [place] [address] PCP: Glendale Chard, MD   Assessment & Plan: Visit Diagnoses:  1. Unilateral primary osteoarthritis, right knee   2. Carpal tunnel syndrome, left upper limb     Plan: Impression is left carpal tunnel syndrome and right knee advanced generative joint disease.  Regards to the left hand paresthesias, we will refer her to Dr. Ernestina Patches for left upper extremity nerve conduction study/EMG.  She will follow up with Maria [name] once that has been completed.  In regards to the right knee, we have submitted approval for viscosupplementation injection.  Follow-up once approved.  This patient is diagnosed with osteoarthritis of the knee(s).    Radiographs show evidence of joint space narrowing, osteophytes, subchondral sclerosis and/or subchondral cysts.  This patient has knee pain which interferes with functional and activities of daily living.    This patient has experienced inadequate response, adverse effects and/or intolerance with conservative treatments such as acetaminophen, NSAIDS, topical creams, physical therapy or regular exercise, knee bracing and/or weight loss.   This patient has experienced inadequate response or has a contraindication to intra articular steroid injections for at least 3 months.   This patient is not scheduled to have a total knee replacement within 6 months of starting treatment with viscosupplementation.   Follow-Up Instructions: Return for after NCS LUE/once approved for right knee visco inj.   Orders:  Orders Placed This Encounter  Procedures  . XR Knee Complete 4 Views Right  . Ambulatory referral to Physical Medicine Rehab   No orders of the defined types were placed in this encounter.     Procedures: No procedures  performed   Clinical Data: No additional findings.   Subjective: Chief Complaint  Patient presents with  . Left Hand - Numbness    HPI patient is a very pleasant 86 year old right-hand-dominant female who comes in today with left hand paresthesias as well as right knee pain.  In regards to the left hand, she has had symptoms here for the past month.  No known injury or change in activity.  Her symptoms are to the thumb, index and long fingers.  She describes primarily numbness and tingling.  She has not previously had nerve conduction study but does note remote history of right carpal tunnel release back in 2012.  Other issue she brings up today is her right knee.  She has seen Dr. Lynann [name] in the past for this where she has been injected with viscosupplementation.  She has previously had cortisone injections as well which did not seem to help.  The pain she has is affecting her ability to walk and she is now ambulating with a walker.  Review of Systems as detailed in HPI.  All other reviewed and are negative.   Objective: Vital Signs: Ht 4\' 11"  (1.499 m)   Wt 152 lb (68.9 kg)   BMI 30.70 kg/m   Physical Exam well-developed well-nourished female no acute distress.  Alert and oriented x3.  Ortho Exam left upper extremity exam shows a positive Phalen and positive Tinel wrist.  She does have slight decrease sensation to the median nerve distribution.  Mild thenar atrophy.  Right knee exam shows a trace effusion.  Range of motion 0 to 95 degrees.  Medial and lateral joint line tenderness.  Moderate patellofemoral crepitus.  Ligaments are stable.  He is neurovascular intact distally.  Specialty Comments:  No specialty comments available.  Imaging: XR Knee Complete 4 Views Right  Result Date: 08/09/2020 X-rays demonstrate marked tricompartmental degenerative changes    PMFS History: Patient Active Problem List   Diagnosis Date Noted  . Seronegative rheumatoid arthritis ([place])  06/02/2020  . Chronic renal disease, stage 3, moderately decreased glomerular filtration rate between 30-59 mL/min/1.73 square meter ([place]) 01/08/2020  . Hypertensive heart and kidney disease with chronic diastolic congestive heart failure and stage 3 chronic kidney disease ([place]) 01/06/2020  . Primary osteoarthritis of right knee 01/06/2020  . Joint pain 12/03/2019  . Hav (hallux abducto valgus), right 11/04/2019  . Diabetic peripheral vascular disease ([place]) 09/15/2019  . Gout 07/02/2019  . Insulin dependent type 2 diabetes mellitus (Maria [name]) 07/02/2019  . Acute on chronic diastolic CHF (congestive heart failure) ([place]) 03/26/2018  . Acute on chronic renal insufficiency 03/26/2018  . Dyspnea on exertion 02/14/2018  . Palpitations 02/13/2017  . Chest pain 03/25/2015  . Acute diverticulitis 03/04/2013  . Bilateral lower extremity edema 01/23/2013  . Hyperlipidemia 01/23/2013  . Diverticulitis 01/31/2012  . Diabetes mellitus ([place]) 01/31/2012  . Essential hypertension 01/31/2012  . Obstructive sleep apnea on CPAP 01/31/2012  . History of TIAs 01/31/2012  . Obesity 01/31/2012  . H/O vertigo 01/31/2012   Past Medical History:  Diagnosis Date  . Anxiety   . Arthritis   . Blood transfusion   . Depression   . Diabetes mellitus   . Edema   . Heart murmur   . Hyperlipidemia   . Hypertension   . Sleep apnea    cpap  . Stroke (Maria [name])    3 x tias  . TIA (transient ischemic attack)     Family History  Problem Relation Age of Onset  . Healthy Mother   . Prostate cancer Father   . Cervical cancer Granddaughter   . Heart Problems Son   . Prostate cancer Son   . Neuropathy Neg Hx     Past Surgical History:  Procedure Laterality Date  . ABDOMINAL HYSTERECTOMY    . APPENDECTOMY    . NM MYOVIEW LTD  [id number]   post stress left ventricle is normal in size, ejection fraction is 65%, normal myocardial perfusion study, low risk scan  . PARTIAL HYSTERECTOMY    . TRANSTHORACIC ECHOCARDIOGRAM   [id number]   Social History   Occupational History  . Occupation: retired  Tobacco Use  . Smoking status: Never Smoker  . Smokeless tobacco: Never Used  Vaping Use  . Vaping Use: Never used  Substance and Sexual Activity  . Alcohol use: No  . Drug use: No  . Sexual activity: Not Currently

## 2020-08-09 NOTE — Telephone Encounter (Signed)
Please precert for right knee gel injection. This is Dr.Xu's patient. Thanks!

## 2020-08-10 ENCOUNTER — Observation Stay (HOSPITAL_COMMUNITY): Payer: Medicare Other

## 2020-08-10 ENCOUNTER — Encounter (HOSPITAL_COMMUNITY): Payer: Self-pay

## 2020-08-10 ENCOUNTER — Other Ambulatory Visit: Payer: Self-pay

## 2020-08-10 ENCOUNTER — Telehealth: Payer: Medicare Other

## 2020-08-10 ENCOUNTER — Inpatient Hospital Stay (HOSPITAL_COMMUNITY)
Admission: EM | Admit: 2020-08-10 | Discharge: 2020-08-16 | DRG: 378 | Disposition: A | Discharge status: Home Health | Payer: Medicare Other | Attending: Internal Medicine | Admitting: Internal Medicine

## 2020-08-10 DIAGNOSIS — E860 Dehydration: Secondary | ICD-10-CM | POA: Diagnosis present

## 2020-08-10 DIAGNOSIS — R531 Weakness: Secondary | ICD-10-CM | POA: Diagnosis not present

## 2020-08-10 DIAGNOSIS — N179 Acute kidney failure, unspecified: Secondary | ICD-10-CM | POA: Diagnosis not present

## 2020-08-10 DIAGNOSIS — R55 Syncope and collapse: Principal | ICD-10-CM | POA: Diagnosis present

## 2020-08-10 DIAGNOSIS — M109 Gout, unspecified: Secondary | ICD-10-CM | POA: Diagnosis present

## 2020-08-10 DIAGNOSIS — R2689 Other abnormalities of gait and mobility: Secondary | ICD-10-CM | POA: Diagnosis not present

## 2020-08-10 DIAGNOSIS — K254 Chronic or unspecified gastric ulcer with hemorrhage: Principal | ICD-10-CM | POA: Diagnosis present

## 2020-08-10 DIAGNOSIS — I130 Hypertensive heart and chronic kidney disease with heart failure and stage 1 through stage 4 chronic kidney disease, or unspecified chronic kidney disease: Secondary | ICD-10-CM | POA: Diagnosis present

## 2020-08-10 DIAGNOSIS — I5032 Chronic diastolic (congestive) heart failure: Secondary | ICD-10-CM

## 2020-08-10 DIAGNOSIS — R5381 Other malaise: Secondary | ICD-10-CM | POA: Diagnosis not present

## 2020-08-10 DIAGNOSIS — I10 Essential (primary) hypertension: Secondary | ICD-10-CM | POA: Diagnosis not present

## 2020-08-10 DIAGNOSIS — M0600 Rheumatoid arthritis without rheumatoid factor, unspecified site: Secondary | ICD-10-CM | POA: Diagnosis present

## 2020-08-10 DIAGNOSIS — K921 Melena: Secondary | ICD-10-CM | POA: Diagnosis not present

## 2020-08-10 DIAGNOSIS — N281 Cyst of kidney, acquired: Secondary | ICD-10-CM | POA: Diagnosis not present

## 2020-08-10 DIAGNOSIS — E785 Hyperlipidemia, unspecified: Secondary | ICD-10-CM | POA: Diagnosis present

## 2020-08-10 DIAGNOSIS — I160 Hypertensive urgency: Secondary | ICD-10-CM | POA: Diagnosis not present

## 2020-08-10 DIAGNOSIS — E876 Hypokalemia: Secondary | ICD-10-CM | POA: Diagnosis not present

## 2020-08-10 DIAGNOSIS — N184 Chronic kidney disease, stage 4 (severe): Secondary | ICD-10-CM | POA: Diagnosis not present

## 2020-08-10 DIAGNOSIS — I63412 Cerebral infarction due to embolism of left middle cerebral artery: Secondary | ICD-10-CM | POA: Diagnosis not present

## 2020-08-10 DIAGNOSIS — Z515 Encounter for palliative care: Secondary | ICD-10-CM | POA: Diagnosis not present

## 2020-08-10 DIAGNOSIS — D509 Iron deficiency anemia, unspecified: Secondary | ICD-10-CM

## 2020-08-10 DIAGNOSIS — T502X5A Adverse effect of carbonic-anhydrase inhibitors, benzothiadiazides and other diuretics, initial encounter: Secondary | ICD-10-CM | POA: Diagnosis present

## 2020-08-10 DIAGNOSIS — K279 Peptic ulcer, site unspecified, unspecified as acute or chronic, without hemorrhage or perforation: Secondary | ICD-10-CM | POA: Diagnosis not present

## 2020-08-10 DIAGNOSIS — K319 Disease of stomach and duodenum, unspecified: Secondary | ICD-10-CM | POA: Diagnosis not present

## 2020-08-10 DIAGNOSIS — N183 Chronic kidney disease, stage 3 unspecified: Secondary | ICD-10-CM | POA: Diagnosis present

## 2020-08-10 DIAGNOSIS — K2970 Gastritis, unspecified, without bleeding: Secondary | ICD-10-CM

## 2020-08-10 DIAGNOSIS — D649 Anemia, unspecified: Secondary | ICD-10-CM

## 2020-08-10 DIAGNOSIS — G4733 Obstructive sleep apnea (adult) (pediatric): Secondary | ICD-10-CM | POA: Diagnosis present

## 2020-08-10 DIAGNOSIS — E669 Obesity, unspecified: Secondary | ICD-10-CM | POA: Diagnosis present

## 2020-08-10 DIAGNOSIS — Z888 Allergy status to other drugs, medicaments and biological substances status: Secondary | ICD-10-CM

## 2020-08-10 DIAGNOSIS — I491 Atrial premature depolarization: Secondary | ICD-10-CM | POA: Diagnosis not present

## 2020-08-10 DIAGNOSIS — E7841 Elevated Lipoprotein(a): Secondary | ICD-10-CM | POA: Diagnosis not present

## 2020-08-10 DIAGNOSIS — K922 Gastrointestinal hemorrhage, unspecified: Secondary | ICD-10-CM | POA: Diagnosis not present

## 2020-08-10 DIAGNOSIS — Z7982 Long term (current) use of aspirin: Secondary | ICD-10-CM

## 2020-08-10 DIAGNOSIS — I421 Obstructive hypertrophic cardiomyopathy: Secondary | ICD-10-CM | POA: Diagnosis not present

## 2020-08-10 DIAGNOSIS — R197 Diarrhea, unspecified: Secondary | ICD-10-CM | POA: Diagnosis present

## 2020-08-10 DIAGNOSIS — D62 Acute posthemorrhagic anemia: Secondary | ICD-10-CM | POA: Diagnosis not present

## 2020-08-10 DIAGNOSIS — F32A Depression, unspecified: Secondary | ICD-10-CM | POA: Diagnosis present

## 2020-08-10 DIAGNOSIS — F419 Anxiety disorder, unspecified: Secondary | ICD-10-CM | POA: Diagnosis present

## 2020-08-10 DIAGNOSIS — K2960 Other gastritis without bleeding: Secondary | ICD-10-CM | POA: Diagnosis present

## 2020-08-10 DIAGNOSIS — I639 Cerebral infarction, unspecified: Secondary | ICD-10-CM | POA: Diagnosis not present

## 2020-08-10 DIAGNOSIS — E119 Type 2 diabetes mellitus without complications: Secondary | ICD-10-CM

## 2020-08-10 DIAGNOSIS — R42 Dizziness and giddiness: Secondary | ICD-10-CM | POA: Diagnosis not present

## 2020-08-10 DIAGNOSIS — M1990 Unspecified osteoarthritis, unspecified site: Secondary | ICD-10-CM | POA: Diagnosis present

## 2020-08-10 DIAGNOSIS — K2210 Ulcer of esophagus without bleeding: Secondary | ICD-10-CM | POA: Diagnosis present

## 2020-08-10 DIAGNOSIS — R1312 Dysphagia, oropharyngeal phase: Secondary | ICD-10-CM | POA: Diagnosis not present

## 2020-08-10 DIAGNOSIS — K2211 Ulcer of esophagus with bleeding: Secondary | ICD-10-CM | POA: Diagnosis not present

## 2020-08-10 DIAGNOSIS — K222 Esophageal obstruction: Secondary | ICD-10-CM | POA: Diagnosis present

## 2020-08-10 DIAGNOSIS — Z8719 Personal history of other diseases of the digestive system: Secondary | ICD-10-CM | POA: Diagnosis not present

## 2020-08-10 DIAGNOSIS — N3000 Acute cystitis without hematuria: Secondary | ICD-10-CM | POA: Diagnosis not present

## 2020-08-10 DIAGNOSIS — I6602 Occlusion and stenosis of left middle cerebral artery: Secondary | ICD-10-CM | POA: Diagnosis not present

## 2020-08-10 DIAGNOSIS — N170 Acute kidney failure with tubular necrosis: Secondary | ICD-10-CM | POA: Diagnosis not present

## 2020-08-10 DIAGNOSIS — K449 Diaphragmatic hernia without obstruction or gangrene: Secondary | ICD-10-CM | POA: Diagnosis not present

## 2020-08-10 DIAGNOSIS — D500 Iron deficiency anemia secondary to blood loss (chronic): Secondary | ICD-10-CM | POA: Diagnosis not present

## 2020-08-10 DIAGNOSIS — K209 Esophagitis, unspecified without bleeding: Secondary | ICD-10-CM | POA: Diagnosis not present

## 2020-08-10 DIAGNOSIS — N189 Chronic kidney disease, unspecified: Secondary | ICD-10-CM | POA: Diagnosis not present

## 2020-08-10 DIAGNOSIS — Z794 Long term (current) use of insulin: Secondary | ICD-10-CM

## 2020-08-10 DIAGNOSIS — E1122 Type 2 diabetes mellitus with diabetic chronic kidney disease: Secondary | ICD-10-CM | POA: Diagnosis not present

## 2020-08-10 DIAGNOSIS — D638 Anemia in other chronic diseases classified elsewhere: Secondary | ICD-10-CM | POA: Diagnosis not present

## 2020-08-10 DIAGNOSIS — Z8042 Family history of malignant neoplasm of prostate: Secondary | ICD-10-CM

## 2020-08-10 DIAGNOSIS — Z8673 Personal history of transient ischemic attack (TIA), and cerebral infarction without residual deficits: Secondary | ICD-10-CM

## 2020-08-10 DIAGNOSIS — R195 Other fecal abnormalities: Secondary | ICD-10-CM | POA: Diagnosis not present

## 2020-08-10 DIAGNOSIS — Z4659 Encounter for fitting and adjustment of other gastrointestinal appliance and device: Secondary | ICD-10-CM | POA: Diagnosis not present

## 2020-08-10 DIAGNOSIS — Z20822 Contact with and (suspected) exposure to covid-19: Secondary | ICD-10-CM | POA: Diagnosis present

## 2020-08-10 DIAGNOSIS — K3189 Other diseases of stomach and duodenum: Secondary | ICD-10-CM | POA: Diagnosis not present

## 2020-08-10 DIAGNOSIS — I959 Hypotension, unspecified: Secondary | ICD-10-CM | POA: Diagnosis present

## 2020-08-10 DIAGNOSIS — Z79899 Other long term (current) drug therapy: Secondary | ICD-10-CM

## 2020-08-10 DIAGNOSIS — R1310 Dysphagia, unspecified: Secondary | ICD-10-CM | POA: Diagnosis not present

## 2020-08-10 DIAGNOSIS — Z6830 Body mass index (BMI) 30.0-30.9, adult: Secondary | ICD-10-CM

## 2020-08-10 DIAGNOSIS — N1832 Chronic kidney disease, stage 3b: Secondary | ICD-10-CM | POA: Diagnosis present

## 2020-08-10 DIAGNOSIS — Z66 Do not resuscitate: Secondary | ICD-10-CM | POA: Diagnosis not present

## 2020-08-10 DIAGNOSIS — N9089 Other specified noninflammatory disorders of vulva and perineum: Secondary | ICD-10-CM | POA: Diagnosis not present

## 2020-08-10 DIAGNOSIS — R0902 Hypoxemia: Secondary | ICD-10-CM | POA: Diagnosis not present

## 2020-08-10 DIAGNOSIS — M25561 Pain in right knee: Secondary | ICD-10-CM | POA: Diagnosis not present

## 2020-08-10 DIAGNOSIS — Z7189 Other specified counseling: Secondary | ICD-10-CM | POA: Diagnosis not present

## 2020-08-10 DIAGNOSIS — R569 Unspecified convulsions: Secondary | ICD-10-CM | POA: Diagnosis not present

## 2020-08-10 LAB — CBC WITH DIFFERENTIAL/PLATELET
Abs Immature Granulocytes: 0.02 10*3/uL (ref 0.00–0.07)
Basophils Absolute: 0 10*3/uL (ref 0.0–0.1)
Basophils Relative: 0 %
Eosinophils Absolute: 0.1 10*3/uL (ref 0.0–0.5)
Eosinophils Relative: 1 %
HCT: 27.2 % — ABNORMAL LOW (ref 36.0–46.0)
Hemoglobin: 8.2 g/dL — ABNORMAL LOW (ref 12.0–15.0)
Immature Granulocytes: 0 %
Lymphocytes Relative: 14 %
Lymphs Abs: 0.9 10*3/uL (ref 0.7–4.0)
MCH: 28.3 pg (ref 26.0–34.0)
MCHC: 30.1 g/dL (ref 30.0–36.0)
MCV: 93.8 fL (ref 80.0–100.0)
Monocytes Absolute: 0.4 10*3/uL (ref 0.1–1.0)
Monocytes Relative: 5 %
Neutro Abs: 5.3 10*3/uL (ref 1.7–7.7)
Neutrophils Relative %: 80 %
Platelets: 219 10*3/uL (ref 150–400)
RBC: 2.9 MIL/uL — ABNORMAL LOW (ref 3.87–5.11)
RDW: 17.8 % — ABNORMAL HIGH (ref 11.5–15.5)
WBC: 6.7 10*3/uL (ref 4.0–10.5)
nRBC: 0 % (ref 0.0–0.2)

## 2020-08-10 LAB — MAGNESIUM: Magnesium: 1.4 mg/dL — ABNORMAL LOW (ref 1.7–2.4)

## 2020-08-10 LAB — IRON AND TIBC
Iron: 38 ug/dL (ref 28–170)
Saturation Ratios: 19 % (ref 10.4–31.8)
TIBC: 205 ug/dL — ABNORMAL LOW (ref 250–450)
UIBC: 167 ug/dL

## 2020-08-10 LAB — RETICULOCYTES
Immature Retic Fract: 24.2 % — ABNORMAL HIGH (ref 2.3–15.9)
RBC.: 2.82 MIL/uL — ABNORMAL LOW (ref 3.87–5.11)
Retic Count, Absolute: 60.3 10*3/uL (ref 19.0–186.0)
Retic Ct Pct: 2.1 % (ref 0.4–3.1)

## 2020-08-10 LAB — BASIC METABOLIC PANEL
Anion gap: 12 (ref 5–15)
BUN: 38 mg/dL — ABNORMAL HIGH (ref 8–23)
CO2: 25 mmol/L (ref 22–32)
Calcium: 8.4 mg/dL — ABNORMAL LOW (ref 8.9–10.3)
Chloride: 103 mmol/L (ref 98–111)
Creatinine, Ser: 2.67 mg/dL — ABNORMAL HIGH (ref 0.44–1.00)
GFR, Estimated: 16 mL/min — ABNORMAL LOW (ref >60)
Glucose, Bld: 160 mg/dL — ABNORMAL HIGH (ref 70–99)
Potassium: 3.5 mmol/L (ref 3.5–5.1)
Sodium: 140 mmol/L (ref 135–145)

## 2020-08-10 LAB — RESP PANEL BY RT-PCR (FLU A&B, COVID) ARPGX2
Influenza A by PCR: NEGATIVE
Influenza B by PCR: NEGATIVE
SARS Coronavirus 2 by RT PCR: NEGATIVE

## 2020-08-10 LAB — HEMOGLOBIN AND HEMATOCRIT, BLOOD
HCT: 25.1 % — ABNORMAL LOW (ref 36.0–46.0)
HCT: 22.3 % — ABNORMAL LOW (ref 36.0–46.0)
Hemoglobin: 7.7 g/dL — ABNORMAL LOW (ref 12.0–15.0)
Hemoglobin: 6.9 g/dL — CL (ref 12.0–15.0)

## 2020-08-10 LAB — GLUCOSE, CAPILLARY
Glucose-Capillary: 137 mg/dL — ABNORMAL HIGH (ref 70–99)
Glucose-Capillary: 128 mg/dL — ABNORMAL HIGH (ref 70–99)

## 2020-08-10 LAB — FOLATE: Folate: >100 ng/mL (ref >5.9)

## 2020-08-10 LAB — CBG MONITORING, ED: Glucose-Capillary: 157 mg/dL — ABNORMAL HIGH (ref 70–99)

## 2020-08-10 LAB — ABO/RH: ABO/RH(D): O POS

## 2020-08-10 LAB — FERRITIN: Ferritin: 63 ng/mL (ref 11–307)

## 2020-08-10 LAB — VITAMIN B12: Vitamin B-12: >7500 pg/mL — ABNORMAL HIGH (ref 180–914)

## 2020-08-10 MED ORDER — ACETAMINOPHEN 325 MG PO TABS
650.0000 mg | ORAL_TABLET | Freq: Four times a day (QID) | ORAL | Status: DC | PRN
  Administered 2020-08-14 – 2020-08-16 (×4): 650 mg via ORAL
  Filled 2020-08-10 (×4): qty 2

## 2020-08-10 MED ORDER — ATORVASTATIN CALCIUM 40 MG PO TABS
40.0000 mg | ORAL_TABLET | ORAL | Status: DC
  Administered 2020-08-10 – 2020-08-15 (×3): 40 mg via ORAL
  Filled 2020-08-10 (×3): qty 1

## 2020-08-10 MED ORDER — SODIUM CHLORIDE 0.9 % IV BOLUS
1000.0000 mL | Freq: Once | INTRAVENOUS | Status: AC
  Administered 2020-08-10: 1000 mL via INTRAVENOUS

## 2020-08-10 MED ORDER — AMLODIPINE BESYLATE 5 MG PO TABS
2.5000 mg | ORAL_TABLET | Freq: Every day | ORAL | Status: DC
  Administered 2020-08-11: 2.5 mg via ORAL
  Filled 2020-08-10: qty 1

## 2020-08-10 MED ORDER — PROMETHAZINE HCL 25 MG PO TABS: 12.5000 mg | ORAL_TABLET | Freq: Four times a day (QID) | ORAL | Status: DC | PRN

## 2020-08-10 MED ORDER — ACETAMINOPHEN 650 MG RE SUPP: 650.0000 mg | Freq: Four times a day (QID) | RECTAL | Status: DC | PRN

## 2020-08-10 MED ORDER — DONEPEZIL HCL 10 MG PO TABS
10.0000 mg | ORAL_TABLET | Freq: Every day | ORAL | Status: DC
  Administered 2020-08-10 – 2020-08-15 (×6): 10 mg via ORAL
  Filled 2020-08-10 (×6): qty 1

## 2020-08-10 MED ORDER — CITALOPRAM HYDROBROMIDE 20 MG PO TABS
20.0000 mg | ORAL_TABLET | Freq: Every day | ORAL | Status: DC
  Administered 2020-08-10 – 2020-08-16 (×7): 20 mg via ORAL
  Filled 2020-08-10 (×6): qty 1

## 2020-08-10 MED ORDER — MAGNESIUM SULFATE IN D5W 1-5 GM/100ML-% IV SOLN
1.0000 g | Freq: Once | INTRAVENOUS | Status: AC
  Administered 2020-08-10: 1 g via INTRAVENOUS
  Filled 2020-08-10: qty 100

## 2020-08-10 MED ORDER — EZETIMIBE 10 MG PO TABS
10.0000 mg | ORAL_TABLET | Freq: Every day | ORAL | Status: DC
  Administered 2020-08-10 – 2020-08-15 (×6): 10 mg via ORAL
  Filled 2020-08-10 (×6): qty 1

## 2020-08-10 MED ORDER — FERROUS SULFATE 325 (65 FE) MG PO TABS
325.0000 mg | ORAL_TABLET | Freq: Every day | ORAL | Status: DC
  Administered 2020-08-11: 325 mg via ORAL
  Filled 2020-08-10: qty 1

## 2020-08-10 MED ORDER — INSULIN ASPART 100 UNIT/ML ~~LOC~~ SOLN
0.0000 [IU] | Freq: Three times a day (TID) | SUBCUTANEOUS | Status: DC
  Administered 2020-08-10 – 2020-08-12 (×2): 1 [IU] via SUBCUTANEOUS
  Administered 2020-08-12: 2 [IU] via SUBCUTANEOUS
  Administered 2020-08-13: 1 [IU] via SUBCUTANEOUS
  Administered 2020-08-15: 3 [IU] via SUBCUTANEOUS
  Administered 2020-08-15 – 2020-08-16 (×2): 1 [IU] via SUBCUTANEOUS
  Administered 2020-08-16: 2 [IU] via SUBCUTANEOUS

## 2020-08-10 MED ORDER — HYDROXYCHLOROQUINE SULFATE 200 MG PO TABS
200.0000 mg | ORAL_TABLET | Freq: Every day | ORAL | Status: DC
  Administered 2020-08-11 – 2020-08-16 (×6): 200 mg via ORAL
  Filled 2020-08-10 (×6): qty 1

## 2020-08-10 MED ORDER — SODIUM CHLORIDE 0.9% FLUSH
3.0000 mL | Freq: Two times a day (BID) | INTRAVENOUS | Status: DC
  Administered 2020-08-10 – 2020-08-16 (×12): 3 mL via INTRAVENOUS

## 2020-08-10 MED ORDER — PANTOPRAZOLE SODIUM 40 MG IV SOLR
40.0000 mg | INTRAVENOUS | Status: DC
  Administered 2020-08-10 – 2020-08-12 (×3): 40 mg via INTRAVENOUS
  Filled 2020-08-10 (×3): qty 40

## 2020-08-10 MED ORDER — POTASSIUM CHLORIDE CRYS ER 20 MEQ PO TBCR
20.0000 meq | EXTENDED_RELEASE_TABLET | Freq: Every day | ORAL | Status: DC
  Administered 2020-08-10 – 2020-08-16 (×7): 20 meq via ORAL
  Filled 2020-08-10 (×7): qty 1

## 2020-08-10 NOTE — ED Notes (Signed)
Report called to [place]

## 2020-08-10 NOTE — ED Triage Notes (Addendum)
Pt was sitting down for breakfast when she felt like she was going to pass out. Home health aid reported to ems brief syncopal episode. Pt denies injury or pain. 500cc NS en route

## 2020-08-10 NOTE — ED Notes (Signed)
Blood consent form signed and at bedside

## 2020-08-10 NOTE — ED Notes (Signed)
Pt given water, coffee and graham crackers with peanut butter

## 2020-08-10 NOTE — Telephone Encounter (Signed)
Noted  

## 2020-08-10 NOTE — H&P (Addendum)
History and Physical    Maria [name] ZOX:[id number] DOB: 09-Dec-1925 DOA: 08/10/2020  PCP: Glendale Chard, MD  Patient coming from: Home  Chief Complaint: "passing out"  HPI: Maria [name] is a 86 y.o. female with medical history significant of DM2, CKD3b, HTN, HFpEF. Presenting with syncopal episode. Her caregiver is at bedside and assisting with history. At 0710 hrs this morning, the patient became dizzy and nauseated. This lasted for about 5 minutes and then the patient passed out for a brief time of 5 - 10 seconds. She slowly started coming around over the course of the next several minutes. EMS was alerted and the patient was brought to the hospital. The caregiver reports that she has had these episodes intermittently over the last few months.   ED Course: She was given fluids. EDP spoke with [facility]) who recommended holding ASA, PPI and observing. TRH was called for admission.   Review of Systems:  No chest pain, palpitations, visual/auditory changes, seizure-like activity, vomiting. Reports intermittent dark stools. Review of systems is otherwise negative for all not mentioned in HPI.   PMHx Past Medical History:  Diagnosis Date  . Anxiety   . Arthritis   . Blood transfusion   . Depression   . Diabetes mellitus   . Edema   . Heart murmur   . Hyperlipidemia   . Hypertension   . Sleep apnea    cpap  . Stroke ([place])    3 x tias  . TIA (transient ischemic attack)     PSHx Past Surgical History:  Procedure Laterality Date  . ABDOMINAL HYSTERECTOMY    . APPENDECTOMY    . NM MYOVIEW LTD  [id number]   post stress left ventricle is normal in size, ejection fraction is 65%, normal myocardial perfusion study, low risk scan  . PARTIAL HYSTERECTOMY    . TRANSTHORACIC ECHOCARDIOGRAM  [id number]    SocHx  reports that she has never smoked. She has never used smokeless tobacco. She reports that she does not drink alcohol and does not use drugs.  Allergies  Allergen  Reactions  . Ace Inhibitors Other (See Comments)    Angioedema   . Valsartan Other (See Comments)    FamHx Family History  Problem Relation Age of Onset  . Healthy Mother   . Prostate cancer Father   . Cervical cancer Granddaughter   . Heart Problems Son   . Prostate cancer Son   . Neuropathy Neg Hx     Prior to Admission medications   Medication Sig Start Date End Date Taking? Authorizing Provider  Accu-Chek FastClix Lancets MISC CHECK BLOOD SUGAR BEFORE BREAKFAST AND DINNER 03/07/20   Glendale Chard, MD  ACCU-CHEK SMARTVIEW test strip TEST TWICE DAILY BEFORE BREAKFAST AND DINNER 10/05/19   Glendale Chard, MD  allopurinol (ZYLOPRIM) 100 MG tablet Take 2 tablets (200 mg total) by mouth daily. 12/01/19   Bo Merino, MD  amLODipine (NORVASC) 5 MG tablet TAKE ONE-HALF (1/2) TABLET DAILY Patient taking differently: 5 mg daily.  04/05/20   Lorretta Harp, MD  aspirin 81 MG tablet Take 1 tablet (81 mg total) by mouth daily. 03/26/18   Erlene Quan, PA-C  atorvastatin (LIPITOR) 40 MG tablet Take 1 tablet (40 mg total) by mouth daily. Patient taking differently: Take 40 mg by mouth daily. Monday, Wednesday, and Friday 02/24/20   Lorretta Harp, MD  Cholecalciferol (VITAMIN D) 2000 UNITS tablet Take 2,000 Units by mouth daily.    [provider]  citalopram (CELEXA) 20 MG tablet Take 20 mg by mouth daily.     [provider]  diclofenac Sodium (VOLTAREN) 1 % GEL Apply 2 g topically 4 (four) times daily. 08/13/19   Glendale Chard, MD  donepezil (ARICEPT) 10 MG tablet TAKE 1 TABLET DAILY IN THE EVENING 11/24/18   Glendale Chard, MD  ezetimibe (ZETIA) 10 MG tablet TAKE 1 TABLET AT BEDTIME 03/29/20   Lorretta Harp, MD  ferrous sulfate 325 (65 FE) MG EC tablet Take 1 tablet (325 mg total) by mouth 2 (two) times daily. Patient taking differently: Take 325 mg by mouth daily.  04/26/20 04/26/21  [provider]  FOLBIC 2.5-25-2 MG TABS tablet TAKE 1 TABLET DAILY  01/26/20   Lorretta Harp, MD  furosemide (LASIX) 40 MG tablet TAKE 1 TABLET TWICE A DAY (NEED TO SCHEDULE AN APPOINTMENT) 07/07/20   Glendale Chard, MD  hydrALAZINE (APRESOLINE) 25 MG tablet TAKE 1 TABLET THREE TIMES A DAY 11/19/19   Lorretta Harp, MD  hydroxychloroquine (PLAQUENIL) 200 MG tablet Take 1 tablet (200 mg total) by mouth daily. 04/04/20   Ofilia Neas, PA-C  insulin glargine, 1 Unit Dial, (TOUJEO SOLOSTAR) 300 UNIT/ML Solostar Pen Inject 19 Units into the skin at bedtime. 02/09/20   Glendale Chard, MD  Insulin Pen Needle (BD PEN NEEDLE MICRO U/F) 32G X 6 MM MISC Use as directed 12/18/18   Glendale Chard, MD  isosorbide mononitrate (IMDUR) 30 MG 24 hr tablet TAKE 1 TABLET DAILY 01/19/20   Lorretta Harp, MD  loratadine (CLARITIN) 10 MG tablet Take 10 mg by mouth every morning.    [provider]  metolazone (ZAROXOLYN) 2.5 MG tablet TAKE 1 TABLET EVERY OTHER DAY 30 MINUTES PRIOR TO YOUR MORNING LASIX DOSE 02/25/20   Lorretta Harp, MD  metoprolol succinate (TOPROL-XL) 25 MG 24 hr tablet TAKE 1 TABLET(25 MG) BY MOUTH DAILY 01/07/20   Kilroy, Doreene Burke, PA-C  potassium chloride (KLOR-CON) 10 MEQ tablet TAKE 1 TABLET TWICE A DAY 03/29/20   Lorretta Harp, MD  predniSONE (DELTASONE) 5 MG tablet Take 4 tabs po x 4 days, 3  tabs po x 4 days, 2  tabs po x 4 days, 1  tab po x 4 days 06/02/20   Bo Merino, MD  Probiotic Product (ALIGN) 4 MG CAPS Take 1 capsule by mouth daily.    [provider]  Semaglutide,0.25 or 0.5MG /DOS, (OZEMPIC, 0.25 OR 0.5 MG/DOSE,) 2 MG/1.5ML SOPN Inject 0.375 mLs (0.5 mg total) into the skin once a week. Inject 0.5mg  into skin once weekly. 03/07/20   Glendale Chard, MD  telmisartan (MICARDIS) 80 MG tablet TAKE 1 TABLET DAILY 03/29/20   Lorretta Harp, MD    Physical Exam: Vitals:   08/10/20 0830 08/10/20 0834 08/10/20 0930 08/10/20 1015  BP: (!) 127/57  (!) 130/53 (!) 134/53  Pulse:   93 83  Resp: 18  17 19   Temp: 97.6 F (36.4 C)      TempSrc: Oral     SpO2:  99% 95% 98%    General: 86 y.o. female resting in bed in NAD Eyes: PERRL, normal sclera ENMT: Nares patent w/o discharge, orophaynx clear, dentition normal, ears w/o discharge/lesions/ulcers Neck: Supple, trachea midline Cardiovascular: RRR, +S1, S2, no m/g/r, equal pulses throughout Respiratory: CTABL, no w/r/r, normal WOB GI: BS+, NDNT, no masses noted, no organomegaly noted MSK: No c/c; BLE edema Skin: No rashes, bruises, ulcerations noted Neuro: A&O x 3, no focal deficits Psyc:  Appropriate interaction and affect, calm/cooperative  Labs on Admission: I have personally reviewed following labs and imaging studies  CBC: Recent Labs  Lab 08/10/20 0841  WBC 6.7  NEUTROABS 5.3  HGB 8.2*  HCT 27.2*  MCV 93.8  PLT 161   Basic Metabolic Panel: Recent Labs  Lab 08/10/20 0841  NA 140  K 3.5  CL 103  CO2 25  GLUCOSE 160*  BUN 38*  CREATININE 2.67*  CALCIUM 8.4*   GFR: Estimated Creatinine Clearance: 11.4 mL/min (A) (by C-G formula based on SCr of 2.67 mg/dL (H)). Liver Function Tests: No results for input(s): AST, ALT, ALKPHOS, BILITOT, PROT, ALBUMIN in the last 168 hours. No results for input(s): LIPASE, AMYLASE in the last 168 hours. No results for input(s): AMMONIA in the last 168 hours. Coagulation Profile: No results for input(s): INR, PROTIME in the last 168 hours. Cardiac Enzymes: No results for input(s): CKTOTAL, CKMB, CKMBINDEX, TROPONINI in the last 168 hours. BNP (last 3 results) No results for input(s): PROBNP in the last 8760 hours. HbA1C: No results for input(s): HGBA1C in the last 72 hours. CBG: Recent Labs  Lab 08/10/20 0845  GLUCAP 157*   Lipid Profile: No results for input(s): CHOL, HDL, LDLCALC, TRIG, CHOLHDL, LDLDIRECT in the last 72 hours. Thyroid Function Tests: No results for input(s): TSH, T4TOTAL, FREET4, T3FREE, THYROIDAB in the last 72 hours. Anemia Panel: No results for input(s): VITAMINB12, FOLATE,  FERRITIN, TIBC, IRON, RETICCTPCT in the last 72 hours. Urine analysis:    Component Value Date/Time   COLORURINE YELLOW 09/01/2016 1943   APPEARANCEUR HAZY (A) 09/01/2016 1943   LABSPEC 1.013 09/01/2016 1943   PHURINE 5.0 09/01/2016 1943   GLUCOSEU NEGATIVE 09/01/2016 1943   HGBUR NEGATIVE 09/01/2016 1943   BILIRUBINUR negative 01/06/2020 [place] 09/01/2016 1943   PROTEINUR Negative 01/06/2020 1122   PROTEINUR NEGATIVE 09/01/2016 1943   UROBILINOGEN 0.2 01/06/2020 1122   UROBILINOGEN 0.2 03/04/2013 0832   NITRITE negative 01/06/2020 1122   NITRITE NEGATIVE 09/01/2016 1943   LEUKOCYTESUR Negative 01/06/2020 1122    Radiological Exams on Admission: XR Knee Complete 4 Views Right  Result Date: 08/09/2020 X-rays demonstrate marked tricompartmental degenerative changes   EKG: Independently reviewed. Sinus, no st elevation, prolonged Qtc  Assessment/Plan Syncope     - place in obs, telemetry     - reviewed Hgb over last year and a half and she's cycled between 8.8 and 9.7. She is a little off her baseline. So we will check q8h H&H, place on protonix, hold her home ASA     - question if she's a little dry; Scr is up a little and her gfr has progressed to very late CKD 4 territory; she's received fluids and feels a little better, hold her diuretics for tonight, her BLE edema is chronic and she needs to wear compression stockings     - check orthostatics     - check echo, CXR, UA  Chronic diastolic HF     - per caregiver, she had an increase in her diuretic a couple of months ago; hold diuretic tonight; can continue her other HF meds as her BP tolerates     - TED hose  DM2     - last A1c is 6.0 on 06/13/20     - DM2 diet, SSI, glucose checks  HTN     - resume home meds slowly d/t hypotensive episode this AM; start with norvasc; add her other BP meds as her BP  tolerates  Iron deficiency anemia     - continue iron, otherwise, follow H&H as above, no transfusion  just yet  AKI on CKD4     - she was CKD3b up until the summer; her gfr took a good dip in September and has continued that dip through today.      - hold diuretics for tonight, rpt labs in AM and determine time to resume as tolerated.     - renal US  Hypomagnesemia     - replace, follow  DVT prophylaxis: TED hose  Code Status: FULL  Family Communication: With caregiver at bedside  Consults called: EDP spoke with [facility]; not formally consulted  Status is: Observation  The patient remains OBS appropriate and will d/c before 2 midnights.  Dispo: The patient is from: Home              Anticipated d/c is to: Home              Anticipated d/c date is: 1 day              Patient currently is not medically stable to d/c.  Jonnie Finner DO [facility]  If 7PM-7AM, please contact night-coverage [email or web address]  08/10/2020, 10:31 AM

## 2020-08-10 NOTE — ED Provider Notes (Signed)
[name] DEPT Provider Note   CSN: [id number] Arrival date & time: 08/10/20  3546     History Chief Complaint  Patient presents with  . Loss of Consciousness    Maria [name] is a 86 y.o. female.  86 yo F with a chief complaints of a syncopal event.  Patient states that she was making breakfast and her home health aide who is over she suddenly felt bad like she may pass out and the nurses aide made her sit down in her rolling chair and she passed out.  She denied any chest pain or pressure.  Denied headache or neck pain.  Denies cough congestion or fever denies nausea vomiting or diarrhea.  Now feels fine.  She has been having shortness of breath on exertion for some time.  Not any worse over the past few days.  No recent medication changes.  Eating and drinking normally.  Has had some dark stools, but is chronically on iron for her anemia.   Loss of Consciousness Episode history:  Single Most recent episode:  Today Duration:  1 minute Timing:  Rare Progression:  Resolved Witnessed: yes   Relieved by:  Nothing Worsened by:  Nothing Ineffective treatments:  None tried Associated symptoms: shortness of breath   Associated symptoms: no chest pain, no dizziness, no fever, no headaches, no nausea, no palpitations and no vomiting        Past Medical History:  Diagnosis Date  . Anxiety   . Arthritis   . Blood transfusion   . Depression   . Diabetes mellitus   . Edema   . Heart murmur   . Hyperlipidemia   . Hypertension   . Sleep apnea    cpap  . Stroke ([place])    3 x tias  . TIA (transient ischemic attack)     Patient Active Problem List   Diagnosis Date Noted  . Syncope 08/10/2020  . Seronegative rheumatoid arthritis ([place]) 06/02/2020  . Chronic renal disease, stage 3, moderately decreased glomerular filtration rate between 30-59 mL/min/1.73 square meter ([place]) 01/08/2020  . Hypertensive heart and kidney disease with chronic  diastolic congestive heart failure and stage 3 chronic kidney disease ([place]) 01/06/2020  . Primary osteoarthritis of right knee 01/06/2020  . Joint pain 12/03/2019  . Hav (hallux abducto valgus), right 11/04/2019  . Diabetic peripheral vascular disease ([place]) 09/15/2019  . Gout 07/02/2019  . Insulin dependent type 2 diabetes mellitus ([place]) 07/02/2019  . Acute on chronic diastolic CHF (congestive heart failure) ([place]) 03/26/2018  . Acute on chronic renal insufficiency 03/26/2018  . Dyspnea on exertion 02/14/2018  . Palpitations 02/13/2017  . Chest pain 03/25/2015  . Acute diverticulitis 03/04/2013  . Bilateral lower extremity edema 01/23/2013  . Hyperlipidemia 01/23/2013  . Diverticulitis 01/31/2012  . Diabetes mellitus ([place]) 01/31/2012  . Essential hypertension 01/31/2012  . Obstructive sleep apnea on CPAP 01/31/2012  . History of TIAs 01/31/2012  . Obesity 01/31/2012  . H/O vertigo 01/31/2012    Past Surgical History:  Procedure Laterality Date  . ABDOMINAL HYSTERECTOMY    . APPENDECTOMY    . NM MYOVIEW LTD  [id number]   post stress left ventricle is normal in size, ejection fraction is 65%, normal myocardial perfusion study, low risk scan  . PARTIAL HYSTERECTOMY    . TRANSTHORACIC ECHOCARDIOGRAM  [id number]     OB History   No obstetric history on file.     Family History  Problem Relation Age of Onset  .  Healthy Mother   . Prostate cancer Father   . Cervical cancer Granddaughter   . Heart Problems Son   . Prostate cancer Son   . Neuropathy Neg Hx     Social History   Tobacco Use  . Smoking status: Never Smoker  . Smokeless tobacco: Never Used  Vaping Use  . Vaping Use: Never used  Substance Use Topics  . Alcohol use: No  . Drug use: No    Home Medications Prior to Admission medications   Medication Sig Start Date End Date Taking? Authorizing Provider  Accu-Chek FastClix Lancets MISC CHECK BLOOD SUGAR BEFORE BREAKFAST AND DINNER 03/07/20   Glendale Chard, MD   ACCU-CHEK SMARTVIEW test strip TEST TWICE DAILY BEFORE BREAKFAST AND DINNER 10/05/19   Glendale Chard, MD  allopurinol (ZYLOPRIM) 100 MG tablet Take 2 tablets (200 mg total) by mouth daily. 12/01/19   Bo Merino, MD  amLODipine (NORVASC) 5 MG tablet TAKE ONE-HALF (1/2) TABLET DAILY Patient taking differently: 5 mg daily.  04/05/20   Lorretta Harp, MD  aspirin 81 MG tablet Take 1 tablet (81 mg total) by mouth daily. 03/26/18   Erlene Quan, PA-C  atorvastatin (LIPITOR) 40 MG tablet Take 1 tablet (40 mg total) by mouth daily. Patient taking differently: Take 40 mg by mouth daily. Monday, Wednesday, and Friday 02/24/20   Lorretta Harp, MD  Cholecalciferol (VITAMIN D) 2000 UNITS tablet Take 2,000 Units by mouth daily.    [provider]  citalopram (CELEXA) 20 MG tablet Take 20 mg by mouth daily.     [provider]  diclofenac Sodium (VOLTAREN) 1 % GEL Apply 2 g topically 4 (four) times daily. 08/13/19   Glendale Chard, MD  donepezil (ARICEPT) 10 MG tablet TAKE 1 TABLET DAILY IN THE EVENING 11/24/18   Glendale Chard, MD  ezetimibe (ZETIA) 10 MG tablet TAKE 1 TABLET AT BEDTIME 03/29/20   Lorretta Harp, MD  ferrous sulfate 325 (65 FE) MG EC tablet Take 1 tablet (325 mg total) by mouth 2 (two) times daily. Patient taking differently: Take 325 mg by mouth daily.  04/26/20 04/26/21  [provider]  FOLBIC 2.5-25-2 MG TABS tablet TAKE 1 TABLET DAILY 01/26/20   Lorretta Harp, MD  furosemide (LASIX) 40 MG tablet TAKE 1 TABLET TWICE A DAY (NEED TO SCHEDULE AN APPOINTMENT) 07/07/20   Glendale Chard, MD  hydrALAZINE (APRESOLINE) 25 MG tablet TAKE 1 TABLET THREE TIMES A DAY 11/19/19   Lorretta Harp, MD  hydroxychloroquine (PLAQUENIL) 200 MG tablet Take 1 tablet (200 mg total) by mouth daily. 04/04/20   Ofilia Neas, PA-C  insulin glargine, 1 Unit Dial, (TOUJEO SOLOSTAR) 300 UNIT/ML Solostar Pen Inject 19 Units into the skin at bedtime. 02/09/20   Glendale Chard, MD   Insulin Pen Needle (BD PEN NEEDLE MICRO U/F) 32G X 6 MM MISC Use as directed 12/18/18   Glendale Chard, MD  isosorbide mononitrate (IMDUR) 30 MG 24 hr tablet TAKE 1 TABLET DAILY 01/19/20   Lorretta Harp, MD  loratadine (CLARITIN) 10 MG tablet Take 10 mg by mouth every morning.    [provider]  metolazone (ZAROXOLYN) 2.5 MG tablet TAKE 1 TABLET EVERY OTHER DAY 30 MINUTES PRIOR TO YOUR MORNING LASIX DOSE 02/25/20   Lorretta Harp, MD  metoprolol succinate (TOPROL-XL) 25 MG 24 hr tablet TAKE 1 TABLET(25 MG) BY MOUTH DAILY 01/07/20   Kilroy, Doreene Burke, PA-C  potassium chloride (KLOR-CON) 10 MEQ tablet TAKE 1 TABLET  TWICE A DAY 03/29/20   Lorretta Harp, MD  predniSONE (DELTASONE) 5 MG tablet Take 4 tabs po x 4 days, 3  tabs po x 4 days, 2  tabs po x 4 days, 1  tab po x 4 days 06/02/20   Bo Merino, MD  Probiotic Product (ALIGN) 4 MG CAPS Take 1 capsule by mouth daily.    [provider]  Semaglutide,0.25 or 0.5MG /DOS, (OZEMPIC, 0.25 OR 0.5 MG/DOSE,) 2 MG/1.5ML SOPN Inject 0.375 mLs (0.5 mg total) into the skin once a week. Inject 0.5mg  into skin once weekly. 03/07/20   Glendale Chard, MD  telmisartan (MICARDIS) 80 MG tablet TAKE 1 TABLET DAILY 03/29/20   Lorretta Harp, MD    Allergies    Ace inhibitors and Valsartan  Review of Systems   Review of Systems  Constitutional: Negative for chills and fever.  HENT: Negative for congestion and rhinorrhea.   Eyes: Negative for redness and visual disturbance.  Respiratory: Positive for shortness of breath. Negative for wheezing.   Cardiovascular: Positive for syncope. Negative for chest pain and palpitations.  Gastrointestinal: Negative for nausea and vomiting.  Genitourinary: Negative for dysuria and urgency.  Musculoskeletal: Negative for arthralgias and myalgias.  Skin: Negative for pallor and wound.  Neurological: Positive for syncope. Negative for dizziness and headaches.    Physical Exam Updated Vital Signs BP  (!) 134/53   Pulse 83   Temp 97.6 F (36.4 C) (Oral)   Resp 19   SpO2 98%   Physical Exam Vitals and nursing note reviewed.  Constitutional:      General: She is not in acute distress.    Appearance: She is well-developed. She is not diaphoretic.     Comments: Subconjunctival pallor  HENT:     Head: Normocephalic and atraumatic.  Eyes:     Pupils: Pupils are equal, round, and reactive to light.  Cardiovascular:     Rate and Rhythm: Normal rate and regular rhythm.     Heart sounds: No murmur heard.  No friction rub. No gallop.   Pulmonary:     Effort: Pulmonary effort is normal.     Breath sounds: No wheezing or rales.  Abdominal:     General: There is no distension.     Palpations: Abdomen is soft.     Tenderness: There is no abdominal tenderness.  Musculoskeletal:        General: No tenderness.     Cervical back: Normal range of motion and neck supple.     Right lower leg: Edema present.     Left lower leg: Edema present.     Comments: 3+ edema up to the shin bilaterally  Skin:    General: Skin is warm and dry.  Neurological:     Mental Status: She is alert and oriented to person, place, and time.  Psychiatric:        Behavior: Behavior normal.     ED Results / Procedures / Treatments   Labs (all labs ordered are listed, but only abnormal results are displayed) Labs Reviewed  CBC WITH DIFFERENTIAL/PLATELET - Abnormal; Notable for the following components:      Result Value   RBC 2.90 (*)    Hemoglobin 8.2 (*)    HCT 27.2 (*)    RDW 17.8 (*)    All other components within normal limits  BASIC METABOLIC PANEL - Abnormal; Notable for the following components:   Glucose, Bld 160 (*)    BUN 38 (*)  Creatinine, Ser 2.67 (*)    Calcium 8.4 (*)    GFR, Estimated 16 (*)    All other components within normal limits  CBG MONITORING, ED - Abnormal; Notable for the following components:   Glucose-Capillary 157 (*)    All other components within normal limits   RESP PANEL BY RT-PCR (FLU A&B, COVID) ARPGX2  VITAMIN B12  FOLATE  IRON AND TIBC  FERRITIN  RETICULOCYTES  TYPE AND SCREEN    EKG EKG Interpretation  Date/Time:  Wednesday August 10 2020 08:29:50 EST Ventricular Rate:  82 PR Interval:    QRS Duration: 136 QT Interval:  421 QTC Calculation: 492 R Axis:   33 Text Interpretation: Sinus rhythm Right bundle branch block Artifact in lead(s) I III aVL V1 V2 with QRS widening Otherwise no significant change Confirmed by Deno Etienne [phone]) on 08/10/2020 8:47:16 AM   Radiology XR Knee Complete 4 Views Right  Result Date: 08/09/2020 X-rays demonstrate marked tricompartmental degenerative changes   Procedures Procedures (including critical care time)  Medications Ordered in ED Medications  sodium chloride 0.9 % bolus 1,000 mL (0 mLs Intravenous Stopped 08/10/20 1028)    ED Course  I have reviewed the triage vital signs and the nursing notes.  Pertinent labs & imaging results that were available during my care of the patient were reviewed by me and considered in my medical decision making (see chart for details).    MDM Rules/Calculators/A&P                          86 yo F with a chief complaints of a syncopal event.  This occurred just prior to arrival.   She feels somewhat better now but very weak.  Hx of anemia.  Give a bolus of IV fluids allow the patient to eat and drink.  Check lab work as she has been chronically anemic.  Patient has a history of diastolic dysfunction most recent echo had an EF greater than 65%.  Hemoglobin is down a gram from baseline.  On reassessment the patient states that she still feels very weak.  She was describing some dark tarry stools though soft and brown on my exam.  Will discuss with GI.   Discussed with Dr. Watt Climes, without obvious GI bleeding, recommended holding asa, ppi and observe. Likely GI outpatient follow up.  Unless worsening anemia.   The patients results and plan were  reviewed and discussed.   Any x-rays performed were independently reviewed by myself.   Differential diagnosis were considered with the presenting HPI.  Medications  sodium chloride 0.9 % bolus 1,000 mL (0 mLs Intravenous Stopped 08/10/20 1028)    Vitals:   08/10/20 0830 08/10/20 0834 08/10/20 0930 08/10/20 1015  BP: (!) 127/57  (!) 130/53 (!) 134/53  Pulse:   93 83  Resp: 18  17 19   Temp: 97.6 F (36.4 C)     TempSrc: Oral     SpO2:  99% 95% 98%    Final diagnoses:  Syncope and collapse  Acute on chronic anemia    Admission/ observation were discussed with the admitting physician, patient and/or family and they are comfortable with the plan.   Final Clinical Impression(s) / ED Diagnoses Final diagnoses:  Syncope and collapse  Acute on chronic anemia    Rx / DC Orders ED Discharge Orders    None       Deno Etienne, DO 08/10/20 1031

## 2020-08-11 ENCOUNTER — Observation Stay (HOSPITAL_BASED_OUTPATIENT_CLINIC_OR_DEPARTMENT_OTHER): Payer: Medicare Other

## 2020-08-11 DIAGNOSIS — D649 Anemia, unspecified: Secondary | ICD-10-CM

## 2020-08-11 DIAGNOSIS — I421 Obstructive hypertrophic cardiomyopathy: Secondary | ICD-10-CM | POA: Diagnosis not present

## 2020-08-11 DIAGNOSIS — R531 Weakness: Secondary | ICD-10-CM

## 2020-08-11 DIAGNOSIS — R55 Syncope and collapse: Secondary | ICD-10-CM | POA: Diagnosis not present

## 2020-08-11 LAB — GLUCOSE, CAPILLARY
Glucose-Capillary: 90 mg/dL (ref 70–99)
Glucose-Capillary: 112 mg/dL — ABNORMAL HIGH (ref 70–99)
Glucose-Capillary: 106 mg/dL — ABNORMAL HIGH (ref 70–99)
Glucose-Capillary: 95 mg/dL (ref 70–99)

## 2020-08-11 LAB — HEMOGLOBIN AND HEMATOCRIT, BLOOD
HCT: 23.4 % — ABNORMAL LOW (ref 36.0–46.0)
HCT: 29.1 % — ABNORMAL LOW (ref 36.0–46.0)
Hemoglobin: 7.2 g/dL — ABNORMAL LOW (ref 12.0–15.0)
Hemoglobin: 8.9 g/dL — ABNORMAL LOW (ref 12.0–15.0)

## 2020-08-11 LAB — COMPREHENSIVE METABOLIC PANEL
ALT: 13 U/L (ref 0–44)
AST: 21 U/L (ref 15–41)
Albumin: 2.6 g/dL — ABNORMAL LOW (ref 3.5–5.0)
Alkaline Phosphatase: 45 U/L (ref 38–126)
Anion gap: 8 (ref 5–15)
BUN: 29 mg/dL — ABNORMAL HIGH (ref 8–23)
CO2: 25 mmol/L (ref 22–32)
Calcium: 8.1 mg/dL — ABNORMAL LOW (ref 8.9–10.3)
Chloride: 109 mmol/L (ref 98–111)
Creatinine, Ser: 1.72 mg/dL — ABNORMAL HIGH (ref 0.44–1.00)
GFR, Estimated: 28 mL/min — ABNORMAL LOW (ref >60)
Glucose, Bld: 95 mg/dL (ref 70–99)
Potassium: 3.3 mmol/L — ABNORMAL LOW (ref 3.5–5.1)
Sodium: 142 mmol/L (ref 135–145)
Total Bilirubin: 0.5 mg/dL (ref 0.3–1.2)
Total Protein: 4.7 g/dL — ABNORMAL LOW (ref 6.5–8.1)

## 2020-08-11 LAB — ECHOCARDIOGRAM COMPLETE
AR max vel: 1.86 cm2
AV Area VTI: 1.76 cm2
AV Area mean vel: 1.85 cm2
AV Mean grad: 13 mmHg
AV Peak grad: 22.1 mmHg
Ao pk vel: 2.35 m/s
Area-P 1/2: 4.68 cm2
Calc EF: 72.7 %
S' Lateral: 1.84 cm
Single Plane A2C EF: 66.1 %
Single Plane A4C EF: 77.1 %
Weight: 2391.55 oz

## 2020-08-11 LAB — OCCULT BLOOD X 1 CARD TO LAB, STOOL: Fecal Occult Bld: POSITIVE — AB

## 2020-08-11 LAB — MAGNESIUM: Magnesium: 1.7 mg/dL (ref 1.7–2.4)

## 2020-08-11 LAB — PREPARE RBC (CROSSMATCH)

## 2020-08-11 LAB — PHOSPHORUS: Phosphorus: 2.5 mg/dL (ref 2.5–4.6)

## 2020-08-11 MED ORDER — SODIUM CHLORIDE 0.9% IV SOLUTION: Freq: Once | INTRAVENOUS | Status: AC

## 2020-08-11 MED ORDER — POTASSIUM CHLORIDE CRYS ER 20 MEQ PO TBCR
40.0000 meq | EXTENDED_RELEASE_TABLET | Freq: Two times a day (BID) | ORAL | Status: AC
  Administered 2020-08-11: 40 meq via ORAL
  Filled 2020-08-11 (×2): qty 2

## 2020-08-11 NOTE — Care Management Obs Status (Signed)
[place] NOTIFICATION   Patient Details  Name: Maria [name] MRN: [id number] Date of Birth: Dec 17, 1925   Medicare Observation Status Notification Given:  Yes    Lynnell Catalan, RN 08/11/2020, 1:18 PM

## 2020-08-11 NOTE — Progress Notes (Signed)
PT Cancellation Note  Patient Details Name: Maria [name] MRN: [id number] DOB: [birth year]   Cancelled Treatment:     PT order received but eval deferred - RN advises pt with transfusion in progress and multiple episodes of loose stool.  Will follow in am.   Haniel Fix 08/11/2020, 3:28 PM

## 2020-08-11 NOTE — Progress Notes (Signed)
  Echocardiogram 2D Echocardiogram has been performed.  Maria [name] 08/11/2020, 10:38 AM

## 2020-08-11 NOTE — Consult Note (Signed)
2D echo attempted, patient eating. Will try later

## 2020-08-11 NOTE — Progress Notes (Signed)
PROGRESS NOTE    [name]  ZGY:[id number] DOB: 11/09/1925 DOA: 08/10/2020 PCP: Glendale Chard, MD   Brief Narrative:  HPI per Dr. Cherylann Ratel on 08/10/20 HPI: Maria [name] is a 86 y.o. female with medical history significant of DM2, CKD3b, HTN, HFpEF. Presenting with syncopal episode. Her caregiver is at bedside and assisting with history. At 0710 hrs this morning, the patient became dizzy and nauseated. This lasted for about 5 minutes and then the patient passed out for a brief time of 5 - 10 seconds. She slowly started coming around over the course of the next several minutes. EMS was alerted and the patient was brought to the hospital. The caregiver reports that she has had these episodes intermittently over the last few months.   ED Course: She was given fluids. EDP spoke with [facility]) who recommended holding ASA, PPI and observing. TRH was called for admission.   **Interim History Patient's blood count dropped overnight and she was typed and screened and transfused 1 unit PRBCs.  Echocardiogram was obtained and will consult cardiology for further recommendations given her syncopal episodes and new HCOM.  Diuretics have been held and will continue to hold today and will defer further management of diuretics to Cardiology  Assessment & Plan:   Active Problems:   Syncope  Syncope likely in the setting of Anemia versus Overdiuresis vs Cardiac Etiology -Placed in Observation Telemetry -Reviewed Hgb over last year and a half and she's cycled between 8.8 and 9.7. Repeat yesterday evening was 6.9/22.3 and this AM was 7.2 was 23.4 -Continue to check q8h H&H, place on protonix, hold her home ASA if there is concern for GI bleeding -She appeared little dry; Scr is up a little and her gfr has progressed to very late CKD 4 territory; she's received fluids and feels a little better, Continue to hold her diuretics again for tonight, her BLE edema is chronic and she needs to wear  compression stockings -Echocardiogram done and showed a hyperdynamic left ventricular function, grade 1 diastolic dysfunction and she had an EF of greater than 75% -We will hold her diuretics given that she may have been over diuresed as she is now taking Lasix 80 mg p.o. 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 daily but may need to reinitate -Cardiology consulted for assistance with management and syncope -Will get PT/OT to evaluate and Treat  Chronic Diastolic HF -Per caregiver, she had an increase in her diuretic a couple of months ago; hold diuretic tonight; can continue her other HF meds as her BP tolerates -She has had more dyspnea on Exertion and Fatigue  -Only have resume amlodipine and her metoprolol succinate 25 mg p.o. daily, telmisartan 80 mg p.o. daily as well as her isosorbide mononitrate 30 mg p.o. daily and hydralazine 25 mg p.o. 3 times daily has been held -ECHOCardiogram done and showed Hyperdynamic LV Fxn with an EF >75%, G1DD, and Findings consistent with HOCM -We will hold her diuretics of furosemide 80 mg p.o. in the a.m., 40 1 PM as well as metolazone 2.5 mg every Tuesday Thursday Saturday -Strict I's and O's and daily weights -Cardiology consulted for further  assistance and management with diuretics and volume maintenance  DM2 -Last A1c is 6.0 on 06/13/20  -Currently holding her semaglutide 0.375 mL into the skin once a week, Lantus 19 units into the skin nightly, -C/w sensitive NovoLog sliding scale insulin AC as well as blood glucose checks -Continue monitor and adjust insulin regimen as necessary  Gout -Allopurinol was held due to Renal Fxn -Will  consider resuming in the AM  Seronegative RA -C/w Hydroxychloroquine 200 mg po Daily   Depression and Anxiety -Continue Citalopram  HTN -Resuming home meds slowly d/t hypotensive episode yestedayAM;  -Holding her hydralazine, telmisartan, furosemide and metolazone -Continue with Amlodipine 2.5 mg daily  Iron Deficiency Anemia with concern for ? Acute Blood Loss -Caregiver states she has been having Dark hard stools  -Hemoglobin/hematocrit went from 8.2/27.2 and trended down to 6.9/22.3 and this morning was 7.2/23.4; she is typed and screened and transfused 1 unit PRBCs -Holding her home aspirin -Started on PPI with IV pantoprazole every 24 hours -Anemia panel done and showed an iron level of 38, U IBC 167, TIBC of 205, saturation ratios 19%, ferritin level 63, folate level greater than 100.0, vitamin B12 greater than 7500 -Continue to monitor for signs and symptoms of bleeding; currently no overt bleeding noted -Will check FOBT especially since Niece states she  -Repeat CBC in a.m.  AKI on CKD3b/Stage 4  -She was CKD3b up until the summer; her gfr worsened in September and has continued to through today.  -Her Diuretics were Held and will hold again for today -She normally takes furosemide 80 mg in the morning and 40 in the evening as well as metolazone 2.5 mg every Tuesday, Thursday, Saturday -Avoid further nephrotoxic medications, contrast dyes and hypotension and hold her naproxen to 20 mg p.o. twice daily as needed as well as telmisartan 80 mils p.o. daily -Patient's BUN/creatinine went from 38/2.6 and is now 29/1.72; GFR is improved and went from 16 -> 28 -Continue monitor and trend renal function panel repeat CMP in a.m.  HLD -C/w Atorvastatin 40 mg MWF and Ezetimibe 10 mg po qHS  Hypomagnesemia -Patient's K+ was 3.3 -Replete with po KCl 20 mEQ with an additional 40 mEQ BID x2 -Continue to Monitor and Replete as Necessary -Repeat Mag Level in the AM    Hypomagnesemia -Mag Level was 1.4 -Replete and repeat this AM not done so will order one now -Continue to Monitor and Replete as Necessary -Repeat Mag Level in the AM   Obesity -Complicates overall prognosis and Care -Estimated body mass index is 30.19 kg/m as calculated from the following:   Height as of 08/09/20: 4\' 11"  (1.499 m).   Weight as of this encounter: 67.8 kg. -Weight Loss and Dietary Counseling given   DVT prophylaxis: SCDs given low Hgb Code Status: FULL CODE  Family Communication: Discussed with Niece via the Telephone Disposition Plan: Pending further clinical improvement and evaluation by cardiology  Status is: Observation  The patient will require care spanning > 2 midnights and should be moved to inpatient because: Unsafe d/c plan, IV treatments appropriate due to intensity of illness or inability to take PO and Inpatient level of care appropriate due to severity of illness  Dispo: The patient is from: Home              Anticipated d/c is to: TBD              Anticipated d/c date is: 2 days  Patient currently is not medically stable to d/c.  Consultants:   Cardiology   Procedures:  ECHOCARDIOGRAM IMPRESSIONS    1. Hyperdynamic LV function; proximal septal thickening with systolic  anterior motion of MV; LVOT velocity of 2.5 m/s that increased to 3.5 m/s;  findings c/w HOCM.  2. Left ventricular ejection fraction, by estimation, is >75%. The left  ventricle has hyperdynamic function. The left ventricle has no regional  wall motion abnormalities. There is mild left ventricular hypertrophy.  Left ventricular diastolic parameters  are consistent with Grade I diastolic dysfunction (impaired relaxation).  3. Right ventricular systolic function is normal. The right ventricular  size is normal.  4. The mitral valve is normal in structure. No evidence of mitral valve  regurgitation. No evidence of mitral stenosis.  5. The aortic valve  has an indeterminant number of cusps. Aortic valve  regurgitation is not visualized. No aortic stenosis is present.  6. The inferior vena cava is normal in size with greater than 50%  respiratory variability, suggesting right atrial pressure of 3 mmHg.   FINDINGS  Left Ventricle: Left ventricular ejection fraction, by estimation, is  >75%. The left ventricle has hyperdynamic function. The left ventricle has  no regional wall motion abnormalities. The left ventricular internal  cavity size was normal in size. There  is mild left ventricular hypertrophy. Left ventricular diastolic  parameters are consistent with Grade I diastolic dysfunction (impaired  relaxation).   Right Ventricle: The right ventricular size is normal.Right ventricular  systolic function is normal.   Left Atrium: Left atrial size was normal in size.   Right Atrium: Right atrial size was normal in size.   Pericardium: There is no evidence of pericardial effusion.   Mitral Valve: The mitral valve is normal in structure. No evidence of  mitral valve regurgitation. No evidence of mitral valve stenosis. MV peak  gradient, 7.8 mmHg. The mean mitral valve gradient is 2.0 mmHg.   Tricuspid Valve: The tricuspid valve is normal in structure. Tricuspid  valve regurgitation is trivial. No evidence of tricuspid stenosis.   Aortic Valve: The aortic valve has an indeterminant number of cusps.  Aortic valve regurgitation is not visualized. No aortic stenosis is  present. Aortic valve mean gradient measures 13.0 mmHg. Aortic valve peak  gradient measures 22.1 mmHg. Aortic valve  area, by VTI measures 1.76 cm.   Pulmonic Valve: The pulmonic valve was not well visualized. Pulmonic valve  regurgitation is not visualized. No evidence of pulmonic stenosis.   Aorta: The aortic root is normal in size and structure.   Venous: The inferior vena cava is normal in size with greater than 50%  respiratory variability, suggesting right  atrial pressure of 3 mmHg.     Additional Comments: Hyperdynamic LV function; proximal septal thickening  with systolic anterior motion of MV; LVOT velocity of 2.5 m/s that  increased to 3.5 m/s; findings c/w HOCM.     LEFT VENTRICLE  PLAX 2D  LVIDd:     2.93 cm   Diastology  LVIDs:     1.84 cm   LV e' medial:  8.43 cm/s  LV PW:     1.34 cm   LV E/e' medial: 10.1  LV IVS:    1.19 cm   LV e' lateral:  8.05 cm/s  LVOT diam:   1.75 cm   LV E/e' lateral: 10.6  LV SV:     77  LV SV Index:  47  LVOT Area:   2.41 cm  LV Volumes (MOD)  LV vol d, MOD A2C: 49.6 ml  LV vol d, MOD A4C: 44.6 ml  LV vol s, MOD A2C: 16.8 ml  LV vol s, MOD A4C: 10.2 ml  LV SV MOD A2C:   32.8 ml  LV SV MOD A4C:   44.6 ml  LV SV MOD BP:   36.6 ml   RIGHT VENTRICLE       IVC  RV S prime:   12.70 cm/s IVC diam: 1.36 cm  TAPSE (M-mode): 1.6 cm   LEFT ATRIUM       Index    RIGHT ATRIUM      Index  LA diam:    3.10 cm 1.90 cm/m RA Area:   13.20 cm  LA Vol (A2C):  36.1 ml 22.15 ml/m RA Volume:  28.80 ml 17.67 ml/m  LA Vol (A4C):  31.4 ml 19.27 ml/m  LA Biplane Vol: 35.0 ml 21.48 ml/m  AORTIC VALVE  AV Area (Vmax):  1.86 cm  AV Area (Vmean):  1.85 cm  AV Area (VTI):   1.76 cm  AV Vmax:      235.00 cm/s  AV Vmean:     169.000 cm/s  AV VTI:      0.439 m  AV Peak Grad:   22.1 mmHg  AV Mean Grad:   13.0 mmHg  LVOT Vmax:     182.00 cm/s  LVOT Vmean:    130.000 cm/s  LVOT VTI:     0.321 m  LVOT/AV VTI ratio: 0.73    AORTA  Ao Root diam: 3.00 cm   MITRAL VALVE  MV Area (PHT): 4.68 cm   SHUNTS  MV Peak grad: 7.8 mmHg   Systemic VTI: 0.32 m  MV Mean grad: 2.0 mmHg   Systemic Diam: 1.75 cm  MV Vmax:    1.40 m/s  MV Vmean:   62.8 cm/s  MV Decel Time: 162 msec  MV E velocity: 85.30 cm/s  MV A velocity: 127.00 cm/s  MV E/A ratio: 0.67   Antimicrobials:  (specify start and planned stop date. Auto populated tables are space occupying and do not give end dates) Anti-infectives (From admission, onward)   Start     Dose/Rate Route Frequency Ordered Stop   08/11/20 1000  hydroxychloroquine (PLAQUENIL) tablet 200 mg        200 mg Oral Daily 08/10/20 1752          Subjective: Seen and examined at bedside and still was feeling fatigued and dyspneic.  No nausea or vomiting.  Denies any lightheadedness or dizziness.  Feels extremely tired and does not know why she passed out.  No other concerns or complaints at this time.  Objective: Vitals:   08/11/20 0347 08/11/20 0540 08/11/20 1354 08/11/20 1415  BP: (!) 150/73 (!) 122/59 125/60 123/60  Pulse: 89 79  82  Resp: 18 16    Temp: 97.8 F (36.6 C) 98.3 F (36.8 C) 98.3 F (36.8 C) 98.8 F (37.1 C)  TempSrc: Oral Oral Oral Oral  SpO2: 100% 95% 95% 97%  Weight:  67.8 kg      Intake/Output Summary (Last 24 hours) at 08/11/2020 1527 Last data filed at 08/11/2020 1400 Gross per 24 hour  Intake 609.87 ml  Output 2 ml  Net 607.87 ml   Filed Weights   08/11/20 0540  Weight: 67.8 kg   Examination: Physical Exam:  Constitutional: WN/WD elderly obese African-American female currently in NAD and appears slightly anxious and mildly uncomfortable Eyes:  Lids and conjunctivae normal, sclerae anicteric  ENMT: External Ears, Nose appear normal. Grossly normal hearing.  Neck: Appears normal, supple, no cervical masses, normal ROM, no appreciable thyromegaly, slight JVD Respiratory: Diminished to auscultation bilaterally, no wheezing, rales, rhonchi or crackles. Normal respiratory effort and patient is not tachypenic. No accessory muscle use.  Cardiovascular: RRR, 2 out of 6 systolic murmur noted. has 1+ lower from edema Abdomen: Soft, non-tender, Distended 2/2 body habitus. Bowel sounds positive.  GU: Deferred. Musculoskeletal: No clubbing / cyanosis of digits/nails. No joint deformity upper and  lower extremities.  Skin: No rashes, lesions, ulcers on limited skin evaluation. No induration; Warm and dry.  Neurologic: CN 2-12 grossly intact with no focal deficits. Romberg sign cerebellar reflexes not assessed.  Psychiatric: Normal judgment and insight. Alert and oriented x 3. Normal mood and appropriate affect.   Data Reviewed: I have personally reviewed following labs and imaging studies  CBC: Recent Labs  Lab 08/10/20 0841 08/10/20 1535 08/10/20 2203 08/11/20 0650  WBC 6.7  --   --   --   NEUTROABS 5.3  --   --   --   HGB 8.2* 7.7* 6.9* 7.2*  HCT 27.2* 25.1* 22.3* 23.4*  MCV 93.8  --   --   --   PLT 219  --   --   --    Basic Metabolic Panel: Recent Labs  Lab 08/10/20 0841 08/11/20 0650  NA 140 142  K 3.5 3.3*  CL 103 109  CO2 25 25  GLUCOSE 160* 95  BUN 38* 29*  CREATININE 2.67* 1.72*  CALCIUM 8.4* 8.1*  MG 1.4*  --    GFR: Estimated Creatinine Clearance: 17.5 mL/min (A) (by C-G formula based on SCr of 1.72 mg/dL (H)). Liver Function Tests: Recent Labs  Lab 08/11/20 0650  AST 21  ALT 13  ALKPHOS 45  BILITOT 0.5  PROT 4.7*  ALBUMIN 2.6*   No results for input(s): LIPASE, AMYLASE in the last 168 hours. No results for input(s): AMMONIA in the last 168 hours. Coagulation Profile: No results for input(s): INR, PROTIME in the last 168 hours. Cardiac Enzymes: No results for input(s): CKTOTAL, CKMB, CKMBINDEX, TROPONINI in the last 168 hours. BNP (last 3 results) No results for input(s): PROBNP in the last 8760 hours. HbA1C: No results for input(s): HGBA1C in the last 72 hours. CBG: Recent Labs  Lab 08/10/20 0845 08/10/20 1622 08/10/20 2133 08/11/20 0749 08/11/20 1154  GLUCAP 157* 137* 128* 90 112*   Lipid Profile: No results for input(s): CHOL, HDL, LDLCALC, TRIG, CHOLHDL, LDLDIRECT in the last 72 hours. Thyroid Function Tests: No results for input(s): TSH, T4TOTAL, FREET4, T3FREE, THYROIDAB in the last 72 hours. Anemia Panel: Recent Labs     08/10/20 0841  VITAMINB12 >7,500*  FOLATE >100.0  FERRITIN 63  TIBC 205*  IRON 38  RETICCTPCT 2.1   Sepsis Labs: No results for input(s): PROCALCITON, LATICACIDVEN in the last 168 hours.  Recent Results (from the past 240 hour(s))  Resp Panel by RT-PCR (Flu A&B, Covid) Nasopharyngeal Swab     Status: None   Collection Time: 08/10/20 10:22 AM   Specimen: Nasopharyngeal Swab; Nasopharyngeal(NP) swabs in vial transport medium  Result Value Ref Range Status   SARS Coronavirus 2 by RT PCR NEGATIVE NEGATIVE Final    Comment: (NOTE) SARS-CoV-2 target nucleic acids are NOT DETECTED.  The SARS-CoV-2 RNA is generally detectable in upper respiratory specimens during the acute phase of infection. The lowest concentration of SARS-CoV-2 viral copies  this assay can detect is 138 copies/mL. A negative result does not preclude SARS-Cov-2 infection and should not be used as the sole basis for treatment or other patient management decisions. A negative result may occur with  improper specimen collection/handling, submission of specimen other than nasopharyngeal swab, presence of viral mutation(s) within the areas targeted by this assay, and inadequate number of viral copies(<138 copies/mL). A negative result must be combined with clinical observations, patient history, and epidemiological information. The expected result is Negative.  Fact Sheet for Patients:  EntrepreneurPulse.com.au  Fact Sheet for Healthcare Providers:  IncredibleEmployment.be  This test is no t yet approved or cleared by the [place] FDA and  has been authorized for detection and/or diagnosis of SARS-CoV-2 by FDA under an Emergency Use Authorization (EUA). This EUA will remain  in effect (meaning this test can be used) for the duration of the COVID-19 declaration under Section 564(b)(1) of the Act, 21 U.S.C.section 360bbb-3(b)(1), unless the authorization is terminated  or  revoked sooner.       Influenza A by PCR NEGATIVE NEGATIVE Final   Influenza B by PCR NEGATIVE NEGATIVE Final    Comment: (NOTE) The Xpert Xpress SARS-CoV-2/FLU/RSV plus assay is intended as an aid in the diagnosis of influenza from Nasopharyngeal swab specimens and should not be used as a sole basis for treatment. Nasal washings and aspirates are unacceptable for Xpert Xpress SARS-CoV-2/FLU/RSV testing.  Fact Sheet for Patients: EntrepreneurPulse.com.au  Fact Sheet for Healthcare Providers: IncredibleEmployment.be  This test is not yet approved or cleared by the [place] FDA and has been authorized for detection and/or diagnosis of SARS-CoV-2 by FDA under an Emergency Use Authorization (EUA). This EUA will remain in effect (meaning this test can be used) for the duration of the COVID-19 declaration under Section 564(b)(1) of the Act, 21 U.S.C. section 360bbb-3(b)(1), unless the authorization is terminated or revoked.  Performed at [facility], [place] [address], [place], [place] [address]      RN Pressure Injury Documentation:     Estimated body mass index is 30.19 kg/m as calculated from the following:   Height as of 08/09/20: 4\' 11"  (1.499 m).   Weight as of this encounter: 67.8 kg.  Malnutrition Type:      Malnutrition Characteristics:      Nutrition Interventions:        Radiology Studies: US RENAL  Result Date: 08/10/2020 CLINICAL DATA:  Inpatient.  Acute kidney injury. EXAM: RENAL / URINARY TRACT ULTRASOUND COMPLETE COMPARISON:  04/25/2020 renal sonogram. FINDINGS: Right Kidney: Renal measurements: 9.5 x 4.9 x 5.4 cm = volume: 132 mL. No hydronephrosis. Mildly echogenic right renal parenchyma, mildly atrophic (9 mm thickness). Simple 1.1 x 0.8 x 0.8 cm renal cyst in lower right kidney. Left Kidney: Renal measurements: 10.4 x 5.8 x 5.1 cm = volume: 160 mL. No hydronephrosis. Echogenic left renal  parenchymar, mildly atrophic (9 mm thickness). Simple 1.1 x 0.8 x 0.9 cm lower left renal cyst. Bladder: Not visualized. Other: None. IMPRESSION: 1. No hydronephrosis. 2. Mildly atrophic and echogenic kidneys bilaterally, compatible with provided history of nonspecific acute renal parenchymal disease, probably acute on chronic. 3. Small simple renal cysts bilaterally. Electronically Signed   By: Ilona Sorrel M.D.   On: 08/10/2020 16:25   Portable Chest 1 View  Result Date: 08/10/2020 CLINICAL DATA:  86 yo F with a chief complaints of a syncopal event. Patient states that she was making breakfast and her home health aide who is over she  suddenly felt bad like she may pass out and the nurses aide made her sit down in her rolling chair and she passed out. She denied any chest pain or pressure. Denied headache or neck pain. Denies cough congestion or fever denies nausea vomiting or diarrhea EXAM: PORTABLE CHEST 1 VIEW COMPARISON:  11/07/2010 FINDINGS: Cardiac silhouette is normal in size. No mediastinal or hilar masses. Clear lungs.  No convincing pleural effusion and no pneumothorax. Skeletal structures are grossly intact. IMPRESSION: No active disease. Electronically Signed   By: Lajean Manes M.D.   On: 08/10/2020 15:33   ECHOCARDIOGRAM COMPLETE  Result Date: 08/11/2020    ECHOCARDIOGRAM REPORT   Patient Name:   MERCEDEES CONVERY Scarola Date of Exam: 08/11/2020 Medical Rec #:  [id number]        Height:       59.0 in Accession #:    [id number]       Weight:       149.5 lb Date of Birth:  [birth year]         BSA:          1.630 m Patient Age:    51 years         BP:           122/59 mmHg Patient Gender: F                HR:           86 bpm. Exam Location:  Inpatient Procedure: 2D Echo, Cardiac Doppler and Color Doppler Indications:    R55 Syncope  History:        Patient has prior history of Echocardiogram examinations, most                 recent 04/20/2019. Abnormal ECG, TIA, Signs/Symptoms:Dyspnea and                  Shortness of Breath; Risk Factors:Dyslipidemia, Hypertension and                 Diabetes.  Sonographer:    Roseanna Rainbow RDCS Referring Phys: [id number] Jonnie Finner  Sonographer Comments: Technically difficult study due to poor echo windows. IMPRESSIONS  1. Hyperdynamic LV function; proximal septal thickening with systolic anterior motion of MV; LVOT velocity of 2.5 m/s that increased to 3.5 m/s; findings c/w HOCM.  2. Left ventricular ejection fraction, by estimation, is >75%. The left ventricle has hyperdynamic function. The left ventricle has no regional wall motion abnormalities. There is mild left ventricular hypertrophy. Left ventricular diastolic parameters are consistent with Grade I diastolic dysfunction (impaired relaxation).  3. Right ventricular systolic function is normal. The right ventricular size is normal.  4. The mitral valve is normal in structure. No evidence of mitral valve regurgitation. No evidence of mitral stenosis.  5. The aortic valve has an indeterminant number of cusps. Aortic valve regurgitation is not visualized. No aortic stenosis is present.  6. The inferior vena cava is normal in size with greater than 50% respiratory variability, suggesting right atrial pressure of 3 mmHg. FINDINGS  Left Ventricle: Left ventricular ejection fraction, by estimation, is >75%. The left ventricle has hyperdynamic function. The left ventricle has no regional wall motion abnormalities. The left ventricular internal cavity size was normal in size. There is mild left ventricular hypertrophy. Left ventricular diastolic parameters are consistent with Grade I diastolic dysfunction (impaired relaxation). Right Ventricle: The right ventricular size is normal.Right ventricular systolic function is normal. Left Atrium: Left atrial size  was normal in size. Right Atrium: Right atrial size was normal in size. Pericardium: There is no evidence of pericardial effusion. Mitral Valve: The mitral valve is normal in  structure. No evidence of mitral valve regurgitation. No evidence of mitral valve stenosis. MV peak gradient, 7.8 mmHg. The mean mitral valve gradient is 2.0 mmHg. Tricuspid Valve: The tricuspid valve is normal in structure. Tricuspid valve regurgitation is trivial. No evidence of tricuspid stenosis. Aortic Valve: The aortic valve has an indeterminant number of cusps. Aortic valve regurgitation is not visualized. No aortic stenosis is present. Aortic valve mean gradient measures 13.0 mmHg. Aortic valve peak gradient measures 22.1 mmHg. Aortic valve area, by VTI measures 1.76 cm. Pulmonic Valve: The pulmonic valve was not well visualized. Pulmonic valve regurgitation is not visualized. No evidence of pulmonic stenosis. Aorta: The aortic root is normal in size and structure. Venous: The inferior vena cava is normal in size with greater than 50% respiratory variability, suggesting right atrial pressure of 3 mmHg.  Additional Comments: Hyperdynamic LV function; proximal septal thickening with systolic anterior motion of MV; LVOT velocity of 2.5 m/s that increased to 3.5 m/s; findings c/w HOCM.  LEFT VENTRICLE PLAX 2D LVIDd:         2.93 cm     Diastology LVIDs:         1.84 cm     LV e' medial:    8.43 cm/s LV PW:         1.34 cm     LV E/e' medial:  10.1 LV IVS:        1.19 cm     LV e' lateral:   8.05 cm/s LVOT diam:     1.75 cm     LV E/e' lateral: 10.6 LV SV:         77 LV SV Index:   47 LVOT Area:     2.41 cm  LV Volumes (MOD) LV vol d, MOD A2C: 49.6 ml LV vol d, MOD A4C: 44.6 ml LV vol s, MOD A2C: 16.8 ml LV vol s, MOD A4C: 10.2 ml LV SV MOD A2C:     32.8 ml LV SV MOD A4C:     44.6 ml LV SV MOD BP:      36.6 ml RIGHT VENTRICLE             IVC RV S prime:     12.70 cm/s  IVC diam: 1.36 cm TAPSE (M-mode): 1.6 cm LEFT ATRIUM             Index       RIGHT ATRIUM           Index LA diam:        3.10 cm 1.90 cm/m  RA Area:     13.20 cm LA Vol (A2C):   36.1 ml 22.15 ml/m RA Volume:   28.80 ml  17.67 ml/m LA Vol  (A4C):   31.4 ml 19.27 ml/m LA Biplane Vol: 35.0 ml 21.48 ml/m  AORTIC VALVE AV Area (Vmax):    1.86 cm AV Area (Vmean):   1.85 cm AV Area (VTI):     1.76 cm AV Vmax:           235.00 cm/s AV Vmean:          169.000 cm/s AV VTI:            0.439 m AV Peak Grad:      22.1 mmHg AV Mean Grad:  13.0 mmHg LVOT Vmax:         182.00 cm/s LVOT Vmean:        130.000 cm/s LVOT VTI:          0.321 m LVOT/AV VTI ratio: 0.73  AORTA Ao Root diam: 3.00 cm MITRAL VALVE MV Area (PHT): 4.68 cm     SHUNTS MV Peak grad:  7.8 mmHg     Systemic VTI:  0.32 m MV Mean grad:  2.0 mmHg     Systemic Diam: 1.75 cm MV Vmax:       1.40 m/s MV Vmean:      62.8 cm/s MV Decel Time: 162 msec MV E velocity: 85.30 cm/s MV A velocity: 127.00 cm/s MV E/A ratio:  0.67 Kirk Ruths MD Electronically signed by Kirk Ruths MD Signature Date/Time: 08/11/2020/1:08:20 PM    Final    Scheduled Meds: . amLODipine  2.5 mg Oral Daily  . atorvastatin  40 mg Oral Q M,W,F  . citalopram  20 mg Oral Daily  . donepezil  10 mg Oral QHS  . ezetimibe  10 mg Oral QHS  . ferrous sulfate  325 mg Oral Q breakfast  . hydroxychloroquine  200 mg Oral Daily  . insulin aspart  0-9 Units Subcutaneous TID WC  . pantoprazole (PROTONIX) IV  40 mg Intravenous Q24H  . potassium chloride  20 mEq Oral Daily  . potassium chloride  40 mEq Oral BID  . sodium chloride flush  3 mL Intravenous Q12H   Continuous Infusions:   LOS: 0 days   Kerney Elbe, DO [facility] PAGER is on AMION  If 7PM-7AM, please contact night-coverage [email or web address]

## 2020-08-12 DIAGNOSIS — N179 Acute kidney failure, unspecified: Secondary | ICD-10-CM | POA: Diagnosis not present

## 2020-08-12 DIAGNOSIS — R197 Diarrhea, unspecified: Secondary | ICD-10-CM

## 2020-08-12 DIAGNOSIS — R55 Syncope and collapse: Secondary | ICD-10-CM

## 2020-08-12 DIAGNOSIS — R195 Other fecal abnormalities: Secondary | ICD-10-CM

## 2020-08-12 DIAGNOSIS — I10 Essential (primary) hypertension: Secondary | ICD-10-CM

## 2020-08-12 DIAGNOSIS — N184 Chronic kidney disease, stage 4 (severe): Secondary | ICD-10-CM

## 2020-08-12 DIAGNOSIS — I5032 Chronic diastolic (congestive) heart failure: Secondary | ICD-10-CM | POA: Diagnosis not present

## 2020-08-12 DIAGNOSIS — N189 Chronic kidney disease, unspecified: Secondary | ICD-10-CM

## 2020-08-12 LAB — CBC WITH DIFFERENTIAL/PLATELET
Abs Immature Granulocytes: 0.02 10*3/uL (ref 0.00–0.07)
Basophils Absolute: 0 10*3/uL (ref 0.0–0.1)
Basophils Relative: 0 %
Eosinophils Absolute: 0.1 10*3/uL (ref 0.0–0.5)
Eosinophils Relative: 2 %
HCT: 30 % — ABNORMAL LOW (ref 36.0–46.0)
Hemoglobin: 9.4 g/dL — ABNORMAL LOW (ref 12.0–15.0)
Immature Granulocytes: 0 %
Lymphocytes Relative: 25 %
Lymphs Abs: 1.3 10*3/uL (ref 0.7–4.0)
MCH: 28.2 pg (ref 26.0–34.0)
MCHC: 31.3 g/dL (ref 30.0–36.0)
MCV: 90.1 fL (ref 80.0–100.0)
Monocytes Absolute: 0.4 10*3/uL (ref 0.1–1.0)
Monocytes Relative: 7 %
Neutro Abs: 3.4 10*3/uL (ref 1.7–7.7)
Neutrophils Relative %: 66 %
Platelets: 169 10*3/uL (ref 150–400)
RBC: 3.33 MIL/uL — ABNORMAL LOW (ref 3.87–5.11)
RDW: 18.6 % — ABNORMAL HIGH (ref 11.5–15.5)
WBC: 5.2 10*3/uL (ref 4.0–10.5)
nRBC: 0 % (ref 0.0–0.2)

## 2020-08-12 LAB — COMPREHENSIVE METABOLIC PANEL
ALT: 16 U/L (ref 0–44)
AST: 23 U/L (ref 15–41)
Albumin: 2.7 g/dL — ABNORMAL LOW (ref 3.5–5.0)
Alkaline Phosphatase: 48 U/L (ref 38–126)
Anion gap: 8 (ref 5–15)
BUN: 22 mg/dL (ref 8–23)
CO2: 24 mmol/L (ref 22–32)
Calcium: 8.5 mg/dL — ABNORMAL LOW (ref 8.9–10.3)
Chloride: 107 mmol/L (ref 98–111)
Creatinine, Ser: 1.39 mg/dL — ABNORMAL HIGH (ref 0.44–1.00)
GFR, Estimated: 36 mL/min — ABNORMAL LOW (ref >60)
Glucose, Bld: 90 mg/dL (ref 70–99)
Potassium: 4.2 mmol/L (ref 3.5–5.1)
Sodium: 139 mmol/L (ref 135–145)
Total Bilirubin: 0.8 mg/dL (ref 0.3–1.2)
Total Protein: 5.2 g/dL — ABNORMAL LOW (ref 6.5–8.1)

## 2020-08-12 LAB — BPAM RBC
Blood Product Expiration Date: 202201022359
ISSUE DATE / TIME: 202112021344
Unit Type and Rh: 5100

## 2020-08-12 LAB — TYPE AND SCREEN
ABO/RH(D): O POS
Antibody Screen: NEGATIVE
Unit division: 0

## 2020-08-12 LAB — PHOSPHORUS: Phosphorus: 2.3 mg/dL — ABNORMAL LOW (ref 2.5–4.6)

## 2020-08-12 LAB — HEMOGLOBIN AND HEMATOCRIT, BLOOD
HCT: 29.3 % — ABNORMAL LOW (ref 36.0–46.0)
HCT: 30.2 % — ABNORMAL LOW (ref 36.0–46.0)
Hemoglobin: 9.3 g/dL — ABNORMAL LOW (ref 12.0–15.0)
Hemoglobin: 9.3 g/dL — ABNORMAL LOW (ref 12.0–15.0)

## 2020-08-12 LAB — GLUCOSE, CAPILLARY
Glucose-Capillary: 81 mg/dL (ref 70–99)
Glucose-Capillary: 84 mg/dL (ref 70–99)
Glucose-Capillary: 131 mg/dL — ABNORMAL HIGH (ref 70–99)
Glucose-Capillary: 176 mg/dL — ABNORMAL HIGH (ref 70–99)
Glucose-Capillary: 101 mg/dL — ABNORMAL HIGH (ref 70–99)

## 2020-08-12 LAB — MAGNESIUM: Magnesium: 1.7 mg/dL (ref 1.7–2.4)

## 2020-08-12 MED ORDER — MAGNESIUM SULFATE 2 GM/50ML IV SOLN
2.0000 g | Freq: Once | INTRAVENOUS | Status: AC
  Administered 2020-08-12: 2 g via INTRAVENOUS
  Filled 2020-08-12: qty 50

## 2020-08-12 MED ORDER — LIP MEDEX EX OINT
TOPICAL_OINTMENT | CUTANEOUS | Status: AC
  Filled 2020-08-12: qty 7

## 2020-08-12 MED ORDER — METOPROLOL SUCCINATE ER 25 MG PO TB24
25.0000 mg | ORAL_TABLET | Freq: Every day | ORAL | Status: DC
  Administered 2020-08-12 – 2020-08-16 (×5): 25 mg via ORAL
  Filled 2020-08-12 (×5): qty 1

## 2020-08-12 MED ORDER — K PHOS MONO-SOD PHOS DI & MONO 155-852-130 MG PO TABS
500.0000 mg | ORAL_TABLET | Freq: Two times a day (BID) | ORAL | Status: AC
  Administered 2020-08-12 (×2): 500 mg via ORAL
  Filled 2020-08-12 (×2): qty 2

## 2020-08-12 NOTE — H&P (View-Only) (Signed)
Reason for Consult: Melena, Anemia, and heme positive stool Referring Physician: [facility]  Edison E Saltos HPI: This is a 86 year old female with a PMH of HTN, HFpEF, OSA, DM, and Stage 3 CKD admitted for syncope.  On the morning of admission she experienced dizziness and a nausea sensation, which was followed by syncope.  The syncope only lasted for a few seconds and she was assisted by her caregiver.  Per her caregiver, she reports having these episodes intermittently over the past several months.  Her HGB on admission showed that her HGB was 8.2 g/dL, but it decreased down to 6.9 g/dL with IV hydration.  After transfusion her HGB increased up to 9.4 g/d, which is her baseline.  She was evaluated by Dr. Collene Mares in the past and the last routine colonoscopy performed was in 2012 with findings of a tubular adenoma.  There is a report of melenic stools and she is heme positive.  Over the past month she reports using some NSAIDs to treat her right knee pain.  The use of the medication is intermittent, but she is not able to quantify the number of times that she used the medication.  She denies any problems with abdominal pain or GERD.  Past Medical History:  Diagnosis Date  . Anxiety   . Arthritis   . Blood transfusion   . Depression   . Diabetes mellitus   . Edema   . Heart murmur   . Hyperlipidemia   . Hypertension   . Sleep apnea    cpap  . Stroke ([place])    3 x tias  . TIA (transient ischemic attack)     Past Surgical History:  Procedure Laterality Date  . ABDOMINAL HYSTERECTOMY    . APPENDECTOMY    . NM MYOVIEW LTD  [id number]   post stress left ventricle is normal in size, ejection fraction is 65%, normal myocardial perfusion study, low risk scan  . PARTIAL HYSTERECTOMY    . TRANSTHORACIC ECHOCARDIOGRAM  [id number]    Family History  Problem Relation Age of Onset  . Healthy Mother   . Prostate cancer Father   . Cervical cancer Granddaughter   . Heart Problems Son   .  Prostate cancer Son   . Neuropathy Neg Hx     Social History:  reports that she has never smoked. She has never used smokeless tobacco. She reports that she does not drink alcohol and does not use drugs.  Allergies:  Allergies  Allergen Reactions  . Ace Inhibitors Other (See Comments)    Angioedema   . Valsartan Other (See Comments)    Medications:  Scheduled: . atorvastatin  40 mg Oral Q M,W,F  . citalopram  20 mg Oral Daily  . donepezil  10 mg Oral QHS  . ezetimibe  10 mg Oral QHS  . hydroxychloroquine  200 mg Oral Daily  . insulin aspart  0-9 Units Subcutaneous TID WC  . metoprolol succinate  25 mg Oral Daily  . pantoprazole (PROTONIX) IV  40 mg Intravenous Q24H  . phosphorus  500 mg Oral BID  . potassium chloride  20 mEq Oral Daily  . sodium chloride flush  3 mL Intravenous Q12H   Continuous:   Results for orders placed or performed during the hospital encounter of 08/10/20 (from the past 24 hour(s))  Glucose, capillary     Status: Abnormal   Collection Time: 08/11/20  4:34 PM  Result Value Ref Range   Glucose-Capillary 106 (H) 70 -  99 mg/dL  Glucose, capillary     Status: None   Collection Time: 08/11/20  9:26 PM  Result Value Ref Range   Glucose-Capillary 95 70 - 99 mg/dL  Hemoglobin and hematocrit, blood     Status: Abnormal   Collection Time: 08/11/20 10:43 PM  Result Value Ref Range   Hemoglobin 8.9 (L) 12.0 - 15.0 g/dL   HCT 29.1 (L) 36 - 46 %  Glucose, capillary     Status: None   Collection Time: 08/12/20  4:43 AM  Result Value Ref Range   Glucose-Capillary 81 70 - 99 mg/dL  CBC with Differential/Platelet     Status: Abnormal   Collection Time: 08/12/20  6:25 AM  Result Value Ref Range   WBC 5.2 4.0 - 10.5 K/uL   RBC 3.33 (L) 3.87 - 5.11 MIL/uL   Hemoglobin 9.4 (L) 12.0 - 15.0 g/dL   HCT 30.0 (L) 36 - 46 %   MCV 90.1 80.0 - 100.0 fL   MCH 28.2 26.0 - 34.0 pg   MCHC 31.3 30.0 - 36.0 g/dL   RDW 18.6 (H) 11.5 - 15.5 %   Platelets 169 150 - 400 K/uL    nRBC 0.0 0.0 - 0.2 %   Neutrophils Relative % 66 %   Neutro Abs 3.4 1.7 - 7.7 K/uL   Lymphocytes Relative 25 %   Lymphs Abs 1.3 0.7 - 4.0 K/uL   Monocytes Relative 7 %   Monocytes Absolute 0.4 0.1 - 1.0 K/uL   Eosinophils Relative 2 %   Eosinophils Absolute 0.1 0.0 - 0.5 K/uL   Basophils Relative 0 %   Basophils Absolute 0.0 0.0 - 0.1 K/uL   Immature Granulocytes 0 %   Abs Immature Granulocytes 0.02 0.00 - 0.07 K/uL  Comprehensive metabolic panel     Status: Abnormal   Collection Time: 08/12/20  6:25 AM  Result Value Ref Range   Sodium 139 135 - 145 mmol/L   Potassium 4.2 3.5 - 5.1 mmol/L   Chloride 107 98 - 111 mmol/L   CO2 24 22 - 32 mmol/L   Glucose, Bld 90 70 - 99 mg/dL   BUN 22 8 - 23 mg/dL   Creatinine, Ser 1.39 (H) 0.44 - 1.00 mg/dL   Calcium 8.5 (L) 8.9 - 10.3 mg/dL   Total Protein 5.2 (L) 6.5 - 8.1 g/dL   Albumin 2.7 (L) 3.5 - 5.0 g/dL   AST 23 15 - 41 U/L   ALT 16 0 - 44 U/L   Alkaline Phosphatase 48 38 - 126 U/L   Total Bilirubin 0.8 0.3 - 1.2 mg/dL   GFR, Estimated 36 (L) >60 mL/min   Anion gap 8 5 - 15  Phosphorus     Status: Abnormal   Collection Time: 08/12/20  6:25 AM  Result Value Ref Range   Phosphorus 2.3 (L) 2.5 - 4.6 mg/dL  Magnesium     Status: None   Collection Time: 08/12/20  6:25 AM  Result Value Ref Range   Magnesium 1.7 1.7 - 2.4 mg/dL  Glucose, capillary     Status: None   Collection Time: 08/12/20  8:08 AM  Result Value Ref Range   Glucose-Capillary 84 70 - 99 mg/dL  Glucose, capillary     Status: Abnormal   Collection Time: 08/12/20 11:30 AM  Result Value Ref Range   Glucose-Capillary 131 (H) 70 - 99 mg/dL     US RENAL  Result Date: 08/10/2020 CLINICAL DATA:  Inpatient.  Acute kidney injury.  EXAM: RENAL / URINARY TRACT ULTRASOUND COMPLETE COMPARISON:  04/25/2020 renal sonogram. FINDINGS: Right Kidney: Renal measurements: 9.5 x 4.9 x 5.4 cm = volume: 132 mL. No hydronephrosis. Mildly echogenic right renal parenchyma, mildly atrophic  (9 mm thickness). Simple 1.1 x 0.8 x 0.8 cm renal cyst in lower right kidney. Left Kidney: Renal measurements: 10.4 x 5.8 x 5.1 cm = volume: 160 mL. No hydronephrosis. Echogenic left renal parenchymar, mildly atrophic (9 mm thickness). Simple 1.1 x 0.8 x 0.9 cm lower left renal cyst. Bladder: Not visualized. Other: None. IMPRESSION: 1. No hydronephrosis. 2. Mildly atrophic and echogenic kidneys bilaterally, compatible with provided history of nonspecific acute renal parenchymal disease, probably acute on chronic. 3. Small simple renal cysts bilaterally. Electronically Signed   By: Ilona Sorrel M.D.   On: 08/10/2020 16:25   Portable Chest 1 View  Result Date: 08/10/2020 CLINICAL DATA:  86 yo F with a chief complaints of a syncopal event. Patient states that she was making breakfast and her home health aide who is over she suddenly felt bad like she may pass out and the nurses aide made her sit down in her rolling chair and she passed out. She denied any chest pain or pressure. Denied headache or neck pain. Denies cough congestion or fever denies nausea vomiting or diarrhea EXAM: PORTABLE CHEST 1 VIEW COMPARISON:  11/07/2010 FINDINGS: Cardiac silhouette is normal in size. No mediastinal or hilar masses. Clear lungs.  No convincing pleural effusion and no pneumothorax. Skeletal structures are grossly intact. IMPRESSION: No active disease. Electronically Signed   By: Lajean Manes M.D.   On: 08/10/2020 15:33   ECHOCARDIOGRAM COMPLETE  Result Date: 08/11/2020    ECHOCARDIOGRAM REPORT   Patient Name:   NELISSA BOLDUC Myron Date of Exam: 08/11/2020 Medical Rec #:  [id number]        Height:       59.0 in Accession #:    [id number]       Weight:       149.5 lb Date of Birth:  [birth year]         BSA:          1.630 m Patient Age:    6 years         BP:           122/59 mmHg Patient Gender: F                HR:           86 bpm. Exam Location:  Inpatient Procedure: 2D Echo, Cardiac Doppler and Color Doppler Indications:     R55 Syncope  History:        Patient has prior history of Echocardiogram examinations, most                 recent 04/20/2019. Abnormal ECG, TIA, Signs/Symptoms:Dyspnea and                 Shortness of Breath; Risk Factors:Dyslipidemia, Hypertension and                 Diabetes.  Sonographer:    Roseanna Rainbow RDCS Referring Phys: [id number] Jonnie Finner  Sonographer Comments: Technically difficult study due to poor echo windows. IMPRESSIONS  1. Hyperdynamic LV function; proximal septal thickening with systolic anterior motion of MV; LVOT velocity of 2.5 m/s that increased to 3.5 m/s; findings c/w HOCM.  2. Left ventricular ejection fraction, by estimation, is >75%. The left ventricle has hyperdynamic function. The left  ventricle has no regional wall motion abnormalities. There is mild left ventricular hypertrophy. Left ventricular diastolic parameters are consistent with Grade I diastolic dysfunction (impaired relaxation).  3. Right ventricular systolic function is normal. The right ventricular size is normal.  4. The mitral valve is normal in structure. No evidence of mitral valve regurgitation. No evidence of mitral stenosis.  5. The aortic valve has an indeterminant number of cusps. Aortic valve regurgitation is not visualized. No aortic stenosis is present.  6. The inferior vena cava is normal in size with greater than 50% respiratory variability, suggesting right atrial pressure of 3 mmHg. FINDINGS  Left Ventricle: Left ventricular ejection fraction, by estimation, is >75%. The left ventricle has hyperdynamic function. The left ventricle has no regional wall motion abnormalities. The left ventricular internal cavity size was normal in size. There is mild left ventricular hypertrophy. Left ventricular diastolic parameters are consistent with Grade I diastolic dysfunction (impaired relaxation). Right Ventricle: The right ventricular size is normal.Right ventricular systolic function is normal. Left Atrium: Left atrial  size was normal in size. Right Atrium: Right atrial size was normal in size. Pericardium: There is no evidence of pericardial effusion. Mitral Valve: The mitral valve is normal in structure. No evidence of mitral valve regurgitation. No evidence of mitral valve stenosis. MV peak gradient, 7.8 mmHg. The mean mitral valve gradient is 2.0 mmHg. Tricuspid Valve: The tricuspid valve is normal in structure. Tricuspid valve regurgitation is trivial. No evidence of tricuspid stenosis. Aortic Valve: The aortic valve has an indeterminant number of cusps. Aortic valve regurgitation is not visualized. No aortic stenosis is present. Aortic valve mean gradient measures 13.0 mmHg. Aortic valve peak gradient measures 22.1 mmHg. Aortic valve area, by VTI measures 1.76 cm. Pulmonic Valve: The pulmonic valve was not well visualized. Pulmonic valve regurgitation is not visualized. No evidence of pulmonic stenosis. Aorta: The aortic root is normal in size and structure. Venous: The inferior vena cava is normal in size with greater than 50% respiratory variability, suggesting right atrial pressure of 3 mmHg.  Additional Comments: Hyperdynamic LV function; proximal septal thickening with systolic anterior motion of MV; LVOT velocity of 2.5 m/s that increased to 3.5 m/s; findings c/w HOCM.  LEFT VENTRICLE PLAX 2D LVIDd:         2.93 cm     Diastology LVIDs:         1.84 cm     LV e' medial:    8.43 cm/s LV PW:         1.34 cm     LV E/e' medial:  10.1 LV IVS:        1.19 cm     LV e' lateral:   8.05 cm/s LVOT diam:     1.75 cm     LV E/e' lateral: 10.6 LV SV:         77 LV SV Index:   47 LVOT Area:     2.41 cm  LV Volumes (MOD) LV vol d, MOD A2C: 49.6 ml LV vol d, MOD A4C: 44.6 ml LV vol s, MOD A2C: 16.8 ml LV vol s, MOD A4C: 10.2 ml LV SV MOD A2C:     32.8 ml LV SV MOD A4C:     44.6 ml LV SV MOD BP:      36.6 ml RIGHT VENTRICLE             IVC RV S prime:     12.70 cm/s  IVC diam: 1.36 cm TAPSE (M-mode):  1.6 cm LEFT ATRIUM              Index       RIGHT ATRIUM           Index LA diam:        3.10 cm 1.90 cm/m  RA Area:     13.20 cm LA Vol (A2C):   36.1 ml 22.15 ml/m RA Volume:   28.80 ml  17.67 ml/m LA Vol (A4C):   31.4 ml 19.27 ml/m LA Biplane Vol: 35.0 ml 21.48 ml/m  AORTIC VALVE AV Area (Vmax):    1.86 cm AV Area (Vmean):   1.85 cm AV Area (VTI):     1.76 cm AV Vmax:           235.00 cm/s AV Vmean:          169.000 cm/s AV VTI:            0.439 m AV Peak Grad:      22.1 mmHg AV Mean Grad:      13.0 mmHg LVOT Vmax:         182.00 cm/s LVOT Vmean:        130.000 cm/s LVOT VTI:          0.321 m LVOT/AV VTI ratio: 0.73  AORTA Ao Root diam: 3.00 cm MITRAL VALVE MV Area (PHT): 4.68 cm     SHUNTS MV Peak grad:  7.8 mmHg     Systemic VTI:  0.32 m MV Mean grad:  2.0 mmHg     Systemic Diam: 1.75 cm MV Vmax:       1.40 m/s MV Vmean:      62.8 cm/s MV Decel Time: 162 msec MV E velocity: 85.30 cm/s MV A velocity: 127.00 cm/s MV E/A ratio:  0.67 Kirk Ruths MD Electronically signed by Kirk Ruths MD Signature Date/Time: 08/11/2020/1:08:20 PM    Final     ROS:  As stated above in the HPI otherwise negative.  Blood pressure (!) 158/83, pulse 82, temperature (!) 97.5 F (36.4 C), temperature source Oral, resp. rate 15, height 4\' 11"  (1.499 m), weight 66.8 kg, SpO2 95 %.    PE: Gen: NAD, Alert and Oriented HEENT:  [place]/AT, EOMI Neck: Supple, no LAD Lungs: CTA Bilaterally CV: RRR without M/G/R ABD: Soft, NTND, +BS Ext: No C/C/E  Assessment/Plan: 1) Melena. 2) Heme positive stool. 3) Anemia. 4) Syncope. 5) HFpEF. 6) Dehydration - improving.   Her syncope was felt to be associated with dehydration.  Her anemia is a contributing source.  Further evaluation with an EGD will be performed.  She may have an NSAID-induced ulceration(s) or erosions as the source of the bleeding.  Plan: 1) EGD tomorrow with Dr. Rush Landmark.  Dawnisha Marquina D 08/12/2020, 12:44 PM

## 2020-08-12 NOTE — Consult Note (Signed)
Cardiology Consultation:   Patient ID: Maria [name] MRN: [id number]; DOB: [birth year]  Admit date: 08/10/2020 Date of Consult: 08/12/2020  Primary Care Provider: Glendale Chard, MD [facility] Cardiologist: Quay Burow, MD  [place] Electrophysiologist:  None    Patient Profile:   Maria [name] is a 86 y.o. female with a hx of DM2, CKD3b, HTN, HFpEF and OSA on CPAP. who is being seen today for the evaluation of syncope at the request of Raiford Noble, DO.  History of Present Illness:   Maria [name] is a 86yo female with medical history significant of DM2, CKD3b, HTN, HFpEF, OSA on CPAP and normal coronary arteries by cath 2012. She walks with a walker.  She has chronic LE edema and chronic DOE related to chronic diastolic CHF. She was last seen by Dr. Gwenlyn Found in Sept 2021 and was doing well.    She presented yesterday to [facility] ER with a syncopal spell.  At 0710 hrs 12/2, the patient became dizzy and nauseated. This lasted for about 5 minutes and then the patient passed out for a brief time of 5 - 10 seconds. She slowly started coming around over the course of the next several minutes. EMS was alerted and the patient was brought to the hospital.   The caregiver that was present in the ER reported that she has had these episodes intermittently over the last few months.  She denied any palpitations, chest pain or pressure, seizure like activity, vomiting or worsening SOB or LE edema.  She had reported intermittent dark stools and has had anemia with Hbg between 8.8 and 9.7 over the past year.  Her SCr was elevated in ER and felt to be dry with worsening of her CKD stage 4 and was given IVF which made her feel better.  Her diuretics were held (her diuretics were increased a few months ago). Bp meds were held yesterday.  Cardiology is asked to help with evaluation of syncope.   Past Medical History:  Diagnosis Date  . Anxiety   . Arthritis   . Blood transfusion   . Depression   .  Diabetes mellitus   . Edema   . Heart murmur   . Hyperlipidemia   . Hypertension   . Sleep apnea    cpap  . Stroke ([place])    3 x tias  . TIA (transient ischemic attack)     Past Surgical History:  Procedure Laterality Date  . ABDOMINAL HYSTERECTOMY    . APPENDECTOMY    . NM MYOVIEW LTD  [id number]   post stress left ventricle is normal in size, ejection fraction is 65%, normal myocardial perfusion study, low risk scan  . PARTIAL HYSTERECTOMY    . TRANSTHORACIC ECHOCARDIOGRAM  [id number]     Home Medications:  Prior to Admission medications   Medication Sig Start Date End Date Taking? Authorizing Provider  allopurinol (ZYLOPRIM) 100 MG tablet Take 2 tablets (200 mg total) by mouth daily. 12/01/19  Yes Deveshwar, Abel Presto, MD  amLODipine (NORVASC) 5 MG tablet TAKE ONE-HALF (1/2) TABLET DAILY Patient taking differently: Take 2.5 mg by mouth daily.  04/05/20  Yes Lorretta Harp, MD  aspirin 81 MG tablet Take 1 tablet (81 mg total) by mouth daily. 03/26/18  Yes Kilroy, Luke K, PA-C  atorvastatin (LIPITOR) 40 MG tablet Take 1 tablet (40 mg total) by mouth daily. Patient taking differently: Take 40 mg by mouth daily. Monday, Wednesday, and Friday 02/24/20  Yes Lorretta Harp, MD  citalopram (  CELEXA) 20 MG tablet Take 20 mg by mouth daily.    Yes [provider]  docusate sodium (COLACE) 100 MG capsule Take 100 mg by mouth 2 (two) times daily.   Yes [provider]  donepezil (ARICEPT) 10 MG tablet TAKE 1 TABLET DAILY IN THE EVENING Patient taking differently: Take 10 mg by mouth at bedtime.  11/24/18  Yes Glendale Chard, MD  ezetimibe (ZETIA) 10 MG tablet TAKE 1 TABLET AT BEDTIME Patient taking differently: Take 10 mg by mouth daily.  03/29/20  Yes Lorretta Harp, MD  ferrous sulfate 325 (65 FE) MG EC tablet Take 1 tablet (325 mg total) by mouth 2 (two) times daily. Patient taking differently: Take 325 mg by mouth daily.  04/26/20 04/26/21 Yes [provider]   FOLBIC 2.5-25-2 MG TABS tablet TAKE 1 TABLET DAILY Patient taking differently: Take 1 tablet by mouth daily.  01/26/20  Yes Lorretta Harp, MD  furosemide (LASIX) 40 MG tablet TAKE 1 TABLET TWICE A DAY (NEED TO SCHEDULE AN APPOINTMENT) Patient taking differently: Take 40 mg by mouth 2 (two) times daily.  07/07/20  Yes Glendale Chard, MD  hydrALAZINE (APRESOLINE) 25 MG tablet TAKE 1 TABLET THREE TIMES A DAY Patient taking differently: Take 25 mg by mouth 3 (three) times daily.  11/19/19  Yes Lorretta Harp, MD  hydroxychloroquine (PLAQUENIL) 200 MG tablet Take 1 tablet (200 mg total) by mouth daily. 04/04/20  Yes Ofilia Neas, PA-C  isosorbide mononitrate (IMDUR) 30 MG 24 hr tablet TAKE 1 TABLET DAILY Patient taking differently: Take 30 mg by mouth daily.  01/19/20  Yes Lorretta Harp, MD  Menthol-Methyl Salicylate (MUSCLE RUB) 10-15 % CREA Apply 1 application topically as needed for muscle pain.   Yes [provider]  metolazone (ZAROXOLYN) 2.5 MG tablet TAKE 1 TABLET EVERY OTHER DAY 30 MINUTES PRIOR TO YOUR MORNING LASIX DOSE Patient taking differently: Take 2.5 mg by mouth. Tuesday Thursday Saturday 02/25/20  Yes Lorretta Harp, MD  metoprolol succinate (TOPROL-XL) 25 MG 24 hr tablet TAKE 1 TABLET(25 MG) BY MOUTH DAILY Patient taking differently: Take 25 mg by mouth daily.  01/07/20  Yes Kilroy, Luke K, PA-C  naproxen sodium (ALEVE) 220 MG tablet Take 220 mg by mouth 2 (two) times daily as needed (headache/pain).   Yes [provider]  potassium chloride (KLOR-CON) 10 MEQ tablet TAKE 1 TABLET TWICE A DAY Patient taking differently: Take 10 mEq by mouth 2 (two) times daily.  03/29/20  Yes Lorretta Harp, MD  telmisartan (MICARDIS) 80 MG tablet TAKE 1 TABLET DAILY Patient taking differently: Take 80 mg by mouth daily.  03/29/20  Yes Lorretta Harp, MD  Accu-Chek FastClix Lancets MISC CHECK BLOOD SUGAR BEFORE BREAKFAST AND DINNER 03/07/20   Glendale Chard, MD   ACCU-CHEK SMARTVIEW test strip TEST TWICE DAILY BEFORE BREAKFAST AND DINNER 10/05/19   Glendale Chard, MD  insulin glargine, 1 Unit Dial, (TOUJEO SOLOSTAR) 300 UNIT/ML Solostar Pen Inject 19 Units into the skin at bedtime. Patient not taking: Reported on 08/10/2020 02/09/20   Glendale Chard, MD  Insulin Pen Needle (BD PEN NEEDLE MICRO U/F) 32G X 6 MM MISC Use as directed 12/18/18   Glendale Chard, MD  predniSONE (DELTASONE) 5 MG tablet Take 4 tabs po x 4 days, 3  tabs po x 4 days, 2  tabs po x 4 days, 1  tab po x 4 days Patient not taking: Reported on 08/10/2020 06/02/20   Bo Merino, MD  Semaglutide,0.25 or 0.5MG /DOS, (OZEMPIC, 0.25 OR 0.5 MG/DOSE,) 2 MG/1.5ML SOPN Inject 0.375 mLs (0.5 mg total) into the skin once a week. Inject 0.5mg  into skin once weekly. Patient not taking: Reported on 08/10/2020 03/07/20   Glendale Chard, MD    Inpatient Medications: Scheduled Meds: . amLODipine  2.5 mg Oral Daily  . atorvastatin  40 mg Oral Q M,W,F  . citalopram  20 mg Oral Daily  . donepezil  10 mg Oral QHS  . ezetimibe  10 mg Oral QHS  . hydroxychloroquine  200 mg Oral Daily  . insulin aspart  0-9 Units Subcutaneous TID WC  . pantoprazole (PROTONIX) IV  40 mg Intravenous Q24H  . phosphorus  500 mg Oral BID  . potassium chloride  20 mEq Oral Daily  . potassium chloride  40 mEq Oral BID  . sodium chloride flush  3 mL Intravenous Q12H   Continuous Infusions: . magnesium sulfate bolus IVPB     PRN Meds: acetaminophen **OR** acetaminophen, promethazine  Allergies:    Allergies  Allergen Reactions  . Ace Inhibitors Other (See Comments)    Angioedema   . Valsartan Other (See Comments)    Social History:   Social History   Socioeconomic History  . Marital status: Widowed    Spouse name: Not on file  . Number of children: 2  . Years of education: Not on file  . Highest education level: Not on file  Occupational History  . Occupation: retired  Tobacco Use  . Smoking status: Never  Smoker  . Smokeless tobacco: Never Used  Vaping Use  . Vaping Use: Never used  Substance and Sexual Activity  . Alcohol use: No  . Drug use: No  . Sexual activity: Not Currently  Other Topics Concern  . Not on file  Social History Narrative   Has SCAT--pt states she is using her friend for transportation now      Lives alone at [place] in an apartment for seniors. She has an aid that comes 4 days a week for 2 hours.    Right handed   Social Determinants of Health   Financial Resource Strain: Low Risk   . Difficulty of Paying Living Expenses: Not hard at all  Food Insecurity: No Food Insecurity  . Worried About Charity fundraiser in the Last Year: Never true  . Ran Out of Food in the Last Year: Never true  Transportation Needs: No Transportation Needs  . Lack of Transportation (Medical): No  . Lack of Transportation (Non-Medical): No  Physical Activity: Inactive  . Days of Exercise per Week: 0 days  . Minutes of Exercise per Session: 0 min  Stress: No Stress Concern Present  . Feeling of Stress : Not at all  Social Connections: Moderately Integrated  . Frequency of Communication with Friends and Family: More than three times a week  . Frequency of Social Gatherings with Friends and Family: More than three times a week  . Attends Religious Services: More than 4 times per year  . Active Member of Clubs or Organizations: Yes  . Attends Archivist Meetings: More than 4 times per year  . Marital Status: Widowed  Intimate Partner Violence:   . Fear of Current or Ex-Partner: Not on file  . Emotionally Abused: Not on file  . Physically Abused: Not on file  . Sexually Abused: Not on file    Family History:    Family History  Problem Relation Age of Onset  .  Healthy Mother   . Prostate cancer Father   . Cervical cancer Granddaughter   . Heart Problems Son   . Prostate cancer Son   . Neuropathy Neg Hx      ROS:  Please see the history of present illness.    All other ROS reviewed and negative.     Physical Exam/Data:   Vitals:   08/11/20 1932 08/11/20 2122 08/12/20 0446 08/12/20 0447  BP:  (!) 156/68  (!) 158/83  Pulse:  89  82  Resp:  16  15  Temp:  98 F (36.7 C)  (!) 97.5 F (36.4 C)  TempSrc:  Oral  Oral  SpO2:  95%  95%  Weight: 67.8 kg  66.8 kg   Height: 4\' 11"  (1.499 m)       Intake/Output Summary (Last 24 hours) at 08/12/2020 0856 Last data filed at 08/12/2020 0600 Gross per 24 hour  Intake 1488.33 ml  Output --  Net 1488.33 ml   Last 3 Weights 08/12/2020 08/11/2020 08/11/2020  Weight (lbs) 147 lb 4.3 oz 149 lb 7.6 oz 149 lb 7.6 oz  Weight (kg) 66.8 kg 67.8 kg 67.8 kg     Body mass index is 29.74 kg/m.  General:  Well nourished, well developed, in no acute distress HEENT: normal Lymph: no adenopathy Neck: no JVD Endocrine:  No thryomegaly Vascular: No carotid bruits; FA pulses 2+ bilaterally without bruits  Cardiac:  normal S1, S2; RRR; no murmur  Lungs:  clear to auscultation bilaterally, no wheezing, rhonchi or rales  Abd: soft, nontender, no hepatomegaly  Ext: no edema Musculoskeletal:  No deformities, BUE and BLE strength normal and equal Skin: warm and dry  Neuro:  CNs 2-12 intact, no focal abnormalities noted Psych:  Normal affect   EKG:  The EKG was personally reviewed and demonstrates:  NSR with RBBB unchanged from 02/2020 Telemetry:  Telemetry was personally reviewed and demonstrates:  NSR  Relevant CV Studies: 2D echo  08/11/2020 IMPRESSIONS   1. Hyperdynamic LV function; proximal septal thickening with systolic  anterior motion of MV; LVOT velocity of 2.5 m/s that increased to 3.5 m/s;  findings c/w HOCM.  2. Left ventricular ejection fraction, by estimation, is >75%. The left  ventricle has hyperdynamic function. The left ventricle has no regional  wall motion abnormalities. There is mild left ventricular hypertrophy.  Left ventricular diastolic parameters  are consistent with Grade I  diastolic dysfunction (impaired relaxation).  3. Right ventricular systolic function is normal. The right ventricular  size is normal.  4. The mitral valve is normal in structure. No evidence of mitral valve  regurgitation. No evidence of mitral stenosis.  5. The aortic valve has an indeterminant number of cusps. Aortic valve  regurgitation is not visualized. No aortic stenosis is present.  6. The inferior vena cava is normal in size with greater than 50%  respiratory variability, suggesting right atrial pressure of 3 mmHg.   Laboratory Data:  High Sensitivity Troponin:  No results for input(s): TROPONINIHS in the last 720 hours.   Chemistry Recent Labs  Lab 08/10/20 0841 08/11/20 0650 08/12/20 0625  NA 140 142 139  K 3.5 3.3* 4.2  CL 103 109 107  CO2 25 25 24   GLUCOSE 160* 95 90  BUN 38* 29* 22  CREATININE 2.67* 1.72* 1.39*  CALCIUM 8.4* 8.1* 8.5*  GFRNONAA 16* 28* 36*  ANIONGAP 12 8 8     Recent Labs  Lab 08/11/20 0650 08/12/20 0625  PROT 4.7* 5.2*  ALBUMIN 2.6* 2.7*  AST 21 23  ALT 13 16  ALKPHOS 45 48  BILITOT 0.5 0.8   Hematology Recent Labs  Lab 09-04-20 0841 09/04/20 1535 08/11/20 0650 08/11/20 2243 08/12/20 0625  WBC 6.7  --   --   --  5.2  RBC 2.90*  2.82*  --   --   --  3.33*  HGB 8.2*   < > 7.2* 8.9* 9.4*  HCT 27.2*   < > 23.4* 29.1* 30.0*  MCV 93.8  --   --   --  90.1  MCH 28.3  --   --   --  28.2  MCHC 30.1  --   --   --  31.3  RDW 17.8*  --   --   --  18.6*  PLT 219  --   --   --  169   < > = values in this interval not displayed.   BNPNo results for input(s): BNP, PROBNP in the last 168 hours.  DDimer No results for input(s): DDIMER in the last 168 hours.   Radiology/Studies:  US RENAL  Result Date: 09-04-2020 CLINICAL DATA:  Inpatient.  Acute kidney injury. EXAM: RENAL / URINARY TRACT ULTRASOUND COMPLETE COMPARISON:  04/25/2020 renal sonogram. FINDINGS: Right Kidney: Renal measurements: 9.5 x 4.9 x 5.4 cm = volume: 132 mL. No  hydronephrosis. Mildly echogenic right renal parenchyma, mildly atrophic (9 mm thickness). Simple 1.1 x 0.8 x 0.8 cm renal cyst in lower right kidney. Left Kidney: Renal measurements: 10.4 x 5.8 x 5.1 cm = volume: 160 mL. No hydronephrosis. Echogenic left renal parenchymar, mildly atrophic (9 mm thickness). Simple 1.1 x 0.8 x 0.9 cm lower left renal cyst. Bladder: Not visualized. Other: None. IMPRESSION: 1. No hydronephrosis. 2. Mildly atrophic and echogenic kidneys bilaterally, compatible with provided history of nonspecific acute renal parenchymal disease, probably acute on chronic. 3. Small simple renal cysts bilaterally. Electronically Signed   By: Ilona Sorrel M.D.   On: Sep 04, 2020 16:25   Portable Chest 1 View  Result Date: 2020/09/04 CLINICAL DATA:  86 yo F with a chief complaints of a syncopal event. Patient states that she was making breakfast and her home health aide who is over she suddenly felt bad like she may pass out and the nurses aide made her sit down in her rolling chair and she passed out. She denied any chest pain or pressure. Denied headache or neck pain. Denies cough congestion or fever denies nausea vomiting or diarrhea EXAM: PORTABLE CHEST 1 VIEW COMPARISON:  11/07/2010 FINDINGS: Cardiac silhouette is normal in size. No mediastinal or hilar masses. Clear lungs.  No convincing pleural effusion and no pneumothorax. Skeletal structures are grossly intact. IMPRESSION: No active disease. Electronically Signed   By: Lajean Manes M.D.   On: 09/04/20 15:33   ECHOCARDIOGRAM COMPLETE  Result Date: 08/11/2020    ECHOCARDIOGRAM REPORT   Patient Name:   DEBRAH GRANDERSON Ingle Date of Exam: 08/11/2020 Medical Rec #:  [id number]        Height:       59.0 in Accession #:    [id number]       Weight:       149.5 lb Date of Birth:  18-Jan-1926         BSA:          1.630 m Patient Age:    25 years         BP:  122/59 mmHg Patient Gender: F                HR:           86 bpm. Exam Location:   Inpatient Procedure: 2D Echo, Cardiac Doppler and Color Doppler Indications:    R55 Syncope  History:        Patient has prior history of Echocardiogram examinations, most                 recent 04/20/2019. Abnormal ECG, TIA, Signs/Symptoms:Dyspnea and                 Shortness of Breath; Risk Factors:Dyslipidemia, Hypertension and                 Diabetes.  Sonographer:    Roseanna Rainbow RDCS Referring Phys: [id number] Jonnie Finner  Sonographer Comments: Technically difficult study due to poor echo windows. IMPRESSIONS  1. Hyperdynamic LV function; proximal septal thickening with systolic anterior motion of MV; LVOT velocity of 2.5 m/s that increased to 3.5 m/s; findings c/w HOCM.  2. Left ventricular ejection fraction, by estimation, is >75%. The left ventricle has hyperdynamic function. The left ventricle has no regional wall motion abnormalities. There is mild left ventricular hypertrophy. Left ventricular diastolic parameters are consistent with Grade I diastolic dysfunction (impaired relaxation).  3. Right ventricular systolic function is normal. The right ventricular size is normal.  4. The mitral valve is normal in structure. No evidence of mitral valve regurgitation. No evidence of mitral stenosis.  5. The aortic valve has an indeterminant number of cusps. Aortic valve regurgitation is not visualized. No aortic stenosis is present.  6. The inferior vena cava is normal in size with greater than 50% respiratory variability, suggesting right atrial pressure of 3 mmHg. FINDINGS  Left Ventricle: Left ventricular ejection fraction, by estimation, is >75%. The left ventricle has hyperdynamic function. The left ventricle has no regional wall motion abnormalities. The left ventricular internal cavity size was normal in size. There is mild left ventricular hypertrophy. Left ventricular diastolic parameters are consistent with Grade I diastolic dysfunction (impaired relaxation). Right Ventricle: The right ventricular size is  normal.Right ventricular systolic function is normal. Left Atrium: Left atrial size was normal in size. Right Atrium: Right atrial size was normal in size. Pericardium: There is no evidence of pericardial effusion. Mitral Valve: The mitral valve is normal in structure. No evidence of mitral valve regurgitation. No evidence of mitral valve stenosis. MV peak gradient, 7.8 mmHg. The mean mitral valve gradient is 2.0 mmHg. Tricuspid Valve: The tricuspid valve is normal in structure. Tricuspid valve regurgitation is trivial. No evidence of tricuspid stenosis. Aortic Valve: The aortic valve has an indeterminant number of cusps. Aortic valve regurgitation is not visualized. No aortic stenosis is present. Aortic valve mean gradient measures 13.0 mmHg. Aortic valve peak gradient measures 22.1 mmHg. Aortic valve area, by VTI measures 1.76 cm. Pulmonic Valve: The pulmonic valve was not well visualized. Pulmonic valve regurgitation is not visualized. No evidence of pulmonic stenosis. Aorta: The aortic root is normal in size and structure. Venous: The inferior vena cava is normal in size with greater than 50% respiratory variability, suggesting right atrial pressure of 3 mmHg.  Additional Comments: Hyperdynamic LV function; proximal septal thickening with systolic anterior motion of MV; LVOT velocity of 2.5 m/s that increased to 3.5 m/s; findings c/w HOCM.  LEFT VENTRICLE PLAX 2D LVIDd:         2.93 cm  Diastology LVIDs:         1.84 cm     LV e' medial:    8.43 cm/s LV PW:         1.34 cm     LV E/e' medial:  10.1 LV IVS:        1.19 cm     LV e' lateral:   8.05 cm/s LVOT diam:     1.75 cm     LV E/e' lateral: 10.6 LV SV:         77 LV SV Index:   47 LVOT Area:     2.41 cm  LV Volumes (MOD) LV vol d, MOD A2C: 49.6 ml LV vol d, MOD A4C: 44.6 ml LV vol s, MOD A2C: 16.8 ml LV vol s, MOD A4C: 10.2 ml LV SV MOD A2C:     32.8 ml LV SV MOD A4C:     44.6 ml LV SV MOD BP:      36.6 ml RIGHT VENTRICLE             IVC RV S prime:      12.70 cm/s  IVC diam: 1.36 cm TAPSE (M-mode): 1.6 cm LEFT ATRIUM             Index       RIGHT ATRIUM           Index LA diam:        3.10 cm 1.90 cm/m  RA Area:     13.20 cm LA Vol (A2C):   36.1 ml 22.15 ml/m RA Volume:   28.80 ml  17.67 ml/m LA Vol (A4C):   31.4 ml 19.27 ml/m LA Biplane Vol: 35.0 ml 21.48 ml/m  AORTIC VALVE AV Area (Vmax):    1.86 cm AV Area (Vmean):   1.85 cm AV Area (VTI):     1.76 cm AV Vmax:           235.00 cm/s AV Vmean:          169.000 cm/s AV VTI:            0.439 m AV Peak Grad:      22.1 mmHg AV Mean Grad:      13.0 mmHg LVOT Vmax:         182.00 cm/s LVOT Vmean:        130.000 cm/s LVOT VTI:          0.321 m LVOT/AV VTI ratio: 0.73  AORTA Ao Root diam: 3.00 cm MITRAL VALVE MV Area (PHT): 4.68 cm     SHUNTS MV Peak grad:  7.8 mmHg     Systemic VTI:  0.32 m MV Mean grad:  2.0 mmHg     Systemic Diam: 1.75 cm MV Vmax:       1.40 m/s MV Vmean:      62.8 cm/s MV Decel Time: 162 msec MV E velocity: 85.30 cm/s MV A velocity: 127.00 cm/s MV E/A ratio:  0.67 Kirk Ruths MD Electronically signed by Kirk Ruths MD Signature Date/Time: 08/11/2020/1:08:20 PM    Final    XR Knee Complete 4 Views Right  Result Date: 08/09/2020 X-rays demonstrate marked tricompartmental degenerative changes    Assessment and Plan:   1. Syncope -suspect due to dehydration  -her SCr increased from 2.23>2.67 after increase in diuretics a few months ago -2D echo with hyperdynamic LVF and read out as HOCM although she only has mild [place] and has never had HOCM dx on prior echoes.   -  2D images personally reviewed and show near cavity obliteration during systole.  Suspect dynamic LV gradient related to hyperdynamic LVF in setting of decreased preload from dehydration.  -SCr has improved to 1.39 this am after IVF and holding diuretics -not orthostatic on admission -Carotid dopplers in 2014 with < 50% stenosis>>repeat dopplers -consider outpt event monitor -Hbg also has been drifting down and  now 8.2 with dark stools>>need to consider GI blood loss >>further workup per TRH  2.  Chronic diastolic CHF -she has chronic LE edema and DOE that have been stable on higher dose of diuretic -weight down from 153lbs at OV in Sept to 147lbs this admit (looks like over the past year her weight in the office has run 155-160lbs) -use compression hose to treat LE edema -continue to hold diuretics for now as weight is much below what it has been over the past year  3.  HTN -BP borderline elevated today  -stop amlodipine which can contribute to LE edema -restart Toprol XL 25mg  daily -If BP remains elevated then restart Hydralazine 10mg  TID and titrate as needed for BP control (on 25mg  TID at home) -TRH to decide on restarting ARB in setting of AKI  4.  AKI on CKD stage 4 -likely due to dehydration from overdiuresis -SCr much improved after holding diuretics and IVF    New York Heart Association (NYHA) Functional Class NYHA Class II      For questions or updates, please contact [facility] Please consult [email or web address] for contact info under    Signed, Fransico Him, MD  08/12/2020 8:56 AM

## 2020-08-12 NOTE — Consult Note (Signed)
Reason for Consult: Melena, Anemia, and heme positive stool Referring Physician: [facility]  Maria [name] HPI: This is a 86 year old female with a PMH of HTN, HFpEF, OSA, DM, and Stage 3 CKD admitted for syncope.  On the morning of admission she experienced dizziness and a nausea sensation, which was followed by syncope.  The syncope only lasted for a few seconds and she was assisted by her caregiver.  Per her caregiver, she reports having these episodes intermittently over the past several months.  Her HGB on admission showed that her HGB was 8.2 g/dL, but it decreased down to 6.9 g/dL with IV hydration.  After transfusion her HGB increased up to 9.4 g/d, which is her baseline.  She was evaluated by Dr. Collene Mares in the past and the last routine colonoscopy performed was in 2012 with findings of a tubular adenoma.  There is a report of melenic stools and she is heme positive.  Over the past month she reports using some NSAIDs to treat her right knee pain.  The use of the medication is intermittent, but she is not able to quantify the number of times that she used the medication.  She denies any problems with abdominal pain or GERD.  Past Medical History:  Diagnosis Date  . Anxiety   . Arthritis   . Blood transfusion   . Depression   . Diabetes mellitus   . Edema   . Heart murmur   . Hyperlipidemia   . Hypertension   . Sleep apnea    cpap  . Stroke ([place])    3 x tias  . TIA (transient ischemic attack)     Past Surgical History:  Procedure Laterality Date  . ABDOMINAL HYSTERECTOMY    . APPENDECTOMY    . NM MYOVIEW LTD  [id number]   post stress left ventricle is normal in size, ejection fraction is 65%, normal myocardial perfusion study, low risk scan  . PARTIAL HYSTERECTOMY    . TRANSTHORACIC ECHOCARDIOGRAM  [id number]    Family History  Problem Relation Age of Onset  . Healthy Mother   . Prostate cancer Father   . Cervical cancer Granddaughter   . Heart Problems Son   .  Prostate cancer Son   . Neuropathy Neg Hx     Social History:  reports that she has never smoked. She has never used smokeless tobacco. She reports that she does not drink alcohol and does not use drugs.  Allergies:  Allergies  Allergen Reactions  . Ace Inhibitors Other (See Comments)    Angioedema   . Valsartan Other (See Comments)    Medications:  Scheduled: . atorvastatin  40 mg Oral Q M,W,F  . citalopram  20 mg Oral Daily  . donepezil  10 mg Oral QHS  . ezetimibe  10 mg Oral QHS  . hydroxychloroquine  200 mg Oral Daily  . insulin aspart  0-9 Units Subcutaneous TID WC  . metoprolol succinate  25 mg Oral Daily  . pantoprazole (PROTONIX) IV  40 mg Intravenous Q24H  . phosphorus  500 mg Oral BID  . potassium chloride  20 mEq Oral Daily  . sodium chloride flush  3 mL Intravenous Q12H   Continuous:   Results for orders placed or performed during the hospital encounter of 08/10/20 (from the past 24 hour(s))  Glucose, capillary     Status: Abnormal   Collection Time: 08/11/20  4:34 PM  Result Value Ref Range   Glucose-Capillary 106 (H) 70 -  99 mg/dL  Glucose, capillary     Status: None   Collection Time: 08/11/20  9:26 PM  Result Value Ref Range   Glucose-Capillary 95 70 - 99 mg/dL  Hemoglobin and hematocrit, blood     Status: Abnormal   Collection Time: 08/11/20 10:43 PM  Result Value Ref Range   Hemoglobin 8.9 (L) 12.0 - 15.0 g/dL   HCT 29.1 (L) 36 - 46 %  Glucose, capillary     Status: None   Collection Time: 08/12/20  4:43 AM  Result Value Ref Range   Glucose-Capillary 81 70 - 99 mg/dL  CBC with Differential/Platelet     Status: Abnormal   Collection Time: 08/12/20  6:25 AM  Result Value Ref Range   WBC 5.2 4.0 - 10.5 K/uL   RBC 3.33 (L) 3.87 - 5.11 MIL/uL   Hemoglobin 9.4 (L) 12.0 - 15.0 g/dL   HCT 30.0 (L) 36 - 46 %   MCV 90.1 80.0 - 100.0 fL   MCH 28.2 26.0 - 34.0 pg   MCHC 31.3 30.0 - 36.0 g/dL   RDW 18.6 (H) 11.5 - 15.5 %   Platelets 169 150 - 400 K/uL    nRBC 0.0 0.0 - 0.2 %   Neutrophils Relative % 66 %   Neutro Abs 3.4 1.7 - 7.7 K/uL   Lymphocytes Relative 25 %   Lymphs Abs 1.3 0.7 - 4.0 K/uL   Monocytes Relative 7 %   Monocytes Absolute 0.4 0.1 - 1.0 K/uL   Eosinophils Relative 2 %   Eosinophils Absolute 0.1 0.0 - 0.5 K/uL   Basophils Relative 0 %   Basophils Absolute 0.0 0.0 - 0.1 K/uL   Immature Granulocytes 0 %   Abs Immature Granulocytes 0.02 0.00 - 0.07 K/uL  Comprehensive metabolic panel     Status: Abnormal   Collection Time: 08/12/20  6:25 AM  Result Value Ref Range   Sodium 139 135 - 145 mmol/L   Potassium 4.2 3.5 - 5.1 mmol/L   Chloride 107 98 - 111 mmol/L   CO2 24 22 - 32 mmol/L   Glucose, Bld 90 70 - 99 mg/dL   BUN 22 8 - 23 mg/dL   Creatinine, Ser 1.39 (H) 0.44 - 1.00 mg/dL   Calcium 8.5 (L) 8.9 - 10.3 mg/dL   Total Protein 5.2 (L) 6.5 - 8.1 g/dL   Albumin 2.7 (L) 3.5 - 5.0 g/dL   AST 23 15 - 41 U/L   ALT 16 0 - 44 U/L   Alkaline Phosphatase 48 38 - 126 U/L   Total Bilirubin 0.8 0.3 - 1.2 mg/dL   GFR, Estimated 36 (L) >60 mL/min   Anion gap 8 5 - 15  Phosphorus     Status: Abnormal   Collection Time: 08/12/20  6:25 AM  Result Value Ref Range   Phosphorus 2.3 (L) 2.5 - 4.6 mg/dL  Magnesium     Status: None   Collection Time: 08/12/20  6:25 AM  Result Value Ref Range   Magnesium 1.7 1.7 - 2.4 mg/dL  Glucose, capillary     Status: None   Collection Time: 08/12/20  8:08 AM  Result Value Ref Range   Glucose-Capillary 84 70 - 99 mg/dL  Glucose, capillary     Status: Abnormal   Collection Time: 08/12/20 11:30 AM  Result Value Ref Range   Glucose-Capillary 131 (H) 70 - 99 mg/dL     US RENAL  Result Date: 08/10/2020 CLINICAL DATA:  Inpatient.  Acute kidney injury.  EXAM: RENAL / URINARY TRACT ULTRASOUND COMPLETE COMPARISON:  04/25/2020 renal sonogram. FINDINGS: Right Kidney: Renal measurements: 9.5 x 4.9 x 5.4 cm = volume: 132 mL. No hydronephrosis. Mildly echogenic right renal parenchyma, mildly atrophic  (9 mm thickness). Simple 1.1 x 0.8 x 0.8 cm renal cyst in lower right kidney. Left Kidney: Renal measurements: 10.4 x 5.8 x 5.1 cm = volume: 160 mL. No hydronephrosis. Echogenic left renal parenchymar, mildly atrophic (9 mm thickness). Simple 1.1 x 0.8 x 0.9 cm lower left renal cyst. Bladder: Not visualized. Other: None. IMPRESSION: 1. No hydronephrosis. 2. Mildly atrophic and echogenic kidneys bilaterally, compatible with provided history of nonspecific acute renal parenchymal disease, probably acute on chronic. 3. Small simple renal cysts bilaterally. Electronically Signed   By: Ilona Sorrel M.D.   On: 08/10/2020 16:25   Portable Chest 1 View  Result Date: 08/10/2020 CLINICAL DATA:  86 yo F with a chief complaints of a syncopal event. Patient states that she was making breakfast and her home health aide who is over she suddenly felt bad like she may pass out and the nurses aide made her sit down in her rolling chair and she passed out. She denied any chest pain or pressure. Denied headache or neck pain. Denies cough congestion or fever denies nausea vomiting or diarrhea EXAM: PORTABLE CHEST 1 VIEW COMPARISON:  11/07/2010 FINDINGS: Cardiac silhouette is normal in size. No mediastinal or hilar masses. Clear lungs.  No convincing pleural effusion and no pneumothorax. Skeletal structures are grossly intact. IMPRESSION: No active disease. Electronically Signed   By: Lajean Manes M.D.   On: 08/10/2020 15:33   ECHOCARDIOGRAM COMPLETE  Result Date: 08/11/2020    ECHOCARDIOGRAM REPORT   Patient Name:   Maria [name] Date of Exam: 08/11/2020 Medical Rec #:  [id number]        Height:       59.0 in Accession #:    [id number]       Weight:       149.5 lb Date of Birth:  03/29/1926         BSA:          1.630 m Patient Age:    51 years         BP:           122/59 mmHg Patient Gender: F                HR:           86 bpm. Exam Location:  Inpatient Procedure: 2D Echo, Cardiac Doppler and Color Doppler Indications:     R55 Syncope  History:        Patient has prior history of Echocardiogram examinations, most                 recent 04/20/2019. Abnormal ECG, TIA, Signs/Symptoms:Dyspnea and                 Shortness of Breath; Risk Factors:Dyslipidemia, Hypertension and                 Diabetes.  Sonographer:    Roseanna Rainbow RDCS Referring Phys: [id number] Jonnie Finner  Sonographer Comments: Technically difficult study due to poor echo windows. IMPRESSIONS  1. Hyperdynamic LV function; proximal septal thickening with systolic anterior motion of MV; LVOT velocity of 2.5 m/s that increased to 3.5 m/s; findings c/w HOCM.  2. Left ventricular ejection fraction, by estimation, is >75%. The left ventricle has hyperdynamic function. The left  ventricle has no regional wall motion abnormalities. There is mild left ventricular hypertrophy. Left ventricular diastolic parameters are consistent with Grade I diastolic dysfunction (impaired relaxation).  3. Right ventricular systolic function is normal. The right ventricular size is normal.  4. The mitral valve is normal in structure. No evidence of mitral valve regurgitation. No evidence of mitral stenosis.  5. The aortic valve has an indeterminant number of cusps. Aortic valve regurgitation is not visualized. No aortic stenosis is present.  6. The inferior vena cava is normal in size with greater than 50% respiratory variability, suggesting right atrial pressure of 3 mmHg. FINDINGS  Left Ventricle: Left ventricular ejection fraction, by estimation, is >75%. The left ventricle has hyperdynamic function. The left ventricle has no regional wall motion abnormalities. The left ventricular internal cavity size was normal in size. There is mild left ventricular hypertrophy. Left ventricular diastolic parameters are consistent with Grade I diastolic dysfunction (impaired relaxation). Right Ventricle: The right ventricular size is normal.Right ventricular systolic function is normal. Left Atrium: Left atrial  size was normal in size. Right Atrium: Right atrial size was normal in size. Pericardium: There is no evidence of pericardial effusion. Mitral Valve: The mitral valve is normal in structure. No evidence of mitral valve regurgitation. No evidence of mitral valve stenosis. MV peak gradient, 7.8 mmHg. The mean mitral valve gradient is 2.0 mmHg. Tricuspid Valve: The tricuspid valve is normal in structure. Tricuspid valve regurgitation is trivial. No evidence of tricuspid stenosis. Aortic Valve: The aortic valve has an indeterminant number of cusps. Aortic valve regurgitation is not visualized. No aortic stenosis is present. Aortic valve mean gradient measures 13.0 mmHg. Aortic valve peak gradient measures 22.1 mmHg. Aortic valve area, by VTI measures 1.76 cm. Pulmonic Valve: The pulmonic valve was not well visualized. Pulmonic valve regurgitation is not visualized. No evidence of pulmonic stenosis. Aorta: The aortic root is normal in size and structure. Venous: The inferior vena cava is normal in size with greater than 50% respiratory variability, suggesting right atrial pressure of 3 mmHg.  Additional Comments: Hyperdynamic LV function; proximal septal thickening with systolic anterior motion of MV; LVOT velocity of 2.5 m/s that increased to 3.5 m/s; findings c/w HOCM.  LEFT VENTRICLE PLAX 2D LVIDd:         2.93 cm     Diastology LVIDs:         1.84 cm     LV e' medial:    8.43 cm/s LV PW:         1.34 cm     LV E/e' medial:  10.1 LV IVS:        1.19 cm     LV e' lateral:   8.05 cm/s LVOT diam:     1.75 cm     LV E/e' lateral: 10.6 LV SV:         77 LV SV Index:   47 LVOT Area:     2.41 cm  LV Volumes (MOD) LV vol d, MOD A2C: 49.6 ml LV vol d, MOD A4C: 44.6 ml LV vol s, MOD A2C: 16.8 ml LV vol s, MOD A4C: 10.2 ml LV SV MOD A2C:     32.8 ml LV SV MOD A4C:     44.6 ml LV SV MOD BP:      36.6 ml RIGHT VENTRICLE             IVC RV S prime:     12.70 cm/s  IVC diam: 1.36 cm TAPSE (M-mode):  1.6 cm LEFT ATRIUM              Index       RIGHT ATRIUM           Index LA diam:        3.10 cm 1.90 cm/m  RA Area:     13.20 cm LA Vol (A2C):   36.1 ml 22.15 ml/m RA Volume:   28.80 ml  17.67 ml/m LA Vol (A4C):   31.4 ml 19.27 ml/m LA Biplane Vol: 35.0 ml 21.48 ml/m  AORTIC VALVE AV Area (Vmax):    1.86 cm AV Area (Vmean):   1.85 cm AV Area (VTI):     1.76 cm AV Vmax:           235.00 cm/s AV Vmean:          169.000 cm/s AV VTI:            0.439 m AV Peak Grad:      22.1 mmHg AV Mean Grad:      13.0 mmHg LVOT Vmax:         182.00 cm/s LVOT Vmean:        130.000 cm/s LVOT VTI:          0.321 m LVOT/AV VTI ratio: 0.73  AORTA Ao Root diam: 3.00 cm MITRAL VALVE MV Area (PHT): 4.68 cm     SHUNTS MV Peak grad:  7.8 mmHg     Systemic VTI:  0.32 m MV Mean grad:  2.0 mmHg     Systemic Diam: 1.75 cm MV Vmax:       1.40 m/s MV Vmean:      62.8 cm/s MV Decel Time: 162 msec MV E velocity: 85.30 cm/s MV A velocity: 127.00 cm/s MV E/A ratio:  0.67 Kirk Ruths MD Electronically signed by Kirk Ruths MD Signature Date/Time: 08/11/2020/1:08:20 PM    Final     ROS:  As stated above in the HPI otherwise negative.  Blood pressure (!) 158/83, pulse 82, temperature (!) 97.5 F (36.4 C), temperature source Oral, resp. rate 15, height 4\' 11"  (1.499 m), weight 66.8 kg, SpO2 95 %.    PE: Gen: NAD, Alert and Oriented HEENT:  [place]/AT, EOMI Neck: Supple, no LAD Lungs: CTA Bilaterally CV: RRR without M/G/R ABD: Soft, NTND, +BS Ext: No C/C/E  Assessment/Plan: 1) Melena. 2) Heme positive stool. 3) Anemia. 4) Syncope. 5) HFpEF. 6) Dehydration - improving.   Her syncope was felt to be associated with dehydration.  Her anemia is a contributing source.  Further evaluation with an EGD will be performed.  She may have an NSAID-induced ulceration(s) or erosions as the source of the bleeding.  Plan: 1) EGD tomorrow with Dr. Rush Landmark.  Airon Sahni D 08/12/2020, 12:44 PM

## 2020-08-12 NOTE — Progress Notes (Signed)
PROGRESS NOTE    Maria [name]  TIW:[id number] DOB: December 21, 1925 DOA: 08/10/2020 PCP: Glendale Chard, MD   Brief Narrative:  HPI per Dr. Cherylann Ratel on 08/10/20 HPI: Maria [name] is a 86 y.o. female with medical history significant of DM2, CKD3b, HTN, HFpEF. Presenting with syncopal episode. Her caregiver is at bedside and assisting with history. At 0710 hrs this morning, the patient became dizzy and nauseated. This lasted for about 5 minutes and then the patient passed out for a brief time of 5 - 10 seconds. She slowly started coming around over the course of the next several minutes. EMS was alerted and the patient was brought to the hospital. The caregiver reports that she has had these episodes intermittently over the last few months.   ED Course: She was given fluids. EDP spoke with [facility]) who recommended holding ASA, PPI and observing. TRH was called for admission.   **Interim History Patient's blood count dropped overnight and she was typed and screened and transfused 1 unit PRBCs.  Echocardiogram was obtained and will consult cardiology for further recommendations given her syncopal episodes and new HCOM.  Diuretics have been held and will continue to hold today and will defer further management of diuretics to Cardiology  Assessment & Plan:   Active Problems:   Syncope  Syncope likely in the setting of Anemia versus Overdiuresis vs Cardiac Etiology; [place] secondary to overdiuresis and hypovolemia complicated by anemia -Placed in Observation Telemetry -Reviewed Hgb over last year and a half and she's cycled between 8.8 and 9.7. Repeat yesterday evening was 6.9/22.3 and this AM was 7.2 was 23.4 -Continue to check q8h H&H, place on protonix, hold her home ASA if there is concern for GI bleeding -She appeared little dry; Scr is up a little and her gfr has progressed to very late CKD 4 territory; she's received fluids and feels a little better, Continue to hold her diuretics  again for tonight, her BLE edema is chronic and she needs to wear compression stockings -Echocardiogram done and showed a hyperdynamic left ventricular function, grade 1 diastolic dysfunction and she had an EF of greater than 75% -We will hold her diuretics given that she may have been over diuresed as she is now taking Lasix 80 mg p.o. in a.m., and 40 mg in the evening as well as metolazone 2.5 mg by mouth every Tuesday, Thursday, Saturday; Of Note Nov 17 she had her Diuretics increased and has been more fatigued by then -Given her new HOCM on ECHO and recurrent syncope we are concerned so we have consulted Cardiology; cardiology does not feel that she has true HOCM and Dr. Radford Pax believes that she has hyperdynamic function in the setting of dehydration -Cardiology has reinitiated her beta-blocker -Cardiology is recommending repeat carotid Dopplers and this has been ordered and they are recommending considering an outpatient event monitor -Cardiology consulted for assistance with management and syncope -Will get PT/OT to evaluate and Treat and they are recommending SNF versus home health PT but her evaluation was limited due to syncope -Cardiology recommending TED hose but patient resistant to this given her leg pain -GI consulted for her anemia and FOBT positive so she will undergo an EGD in the morning  Chronic Diastolic HF -Per caregiver, she had an increase in her diuretic a couple of months ago; hold diuretic tonight; can continue her other HF meds as her BP tolerates -She has had more dyspnea on Exertion and Fatigue  -Only have resume amlodipine  and her metoprolol succinate 25 mg p.o. daily, telmisartan 80 mg p.o. daily as well as her isosorbide mononitrate 30 mg p.o. daily and hydralazine 25 mg p.o. 3 times daily has been held -ECHOCardiogram done and showed Hyperdynamic LV Fxn with an EF >75%, G1DD, and Findings consistent with HOCM -We will hold her diuretics of furosemide 80 mg p.o. in  the a.m., 40 1 PM as well as metolazone 2.5 mg every Tuesday Thursday Saturday -Likely she is over diuresed and cardiology recommends continue to hold diuretics for now as weight has been much lower than what it has been in the past year -Strict I's and O's and daily weights -She has bilateral lower extremity edema swelling is chronic and may need to rule out DVT and obtain a lower extremity venous duplex -Cardiology consulted for further assistance and management with diuretics and volume maintenance; cardiology recommending to watch her off diuretics currently and stop amlodipine  DM2 -Last A1c is 6.0 on 06/13/20  -Currently holding her semaglutide 0.375 mL into the skin once a week, Lantus 19 units into the skin nightly, -C/w sensitive NovoLog sliding scale insulin AC as well as blood glucose checks -Continue monitor and adjust insulin regimen as necessary -CBGs ranging from 81-176  Gout -Allopurinol was held due to Renal Fxn -Will consider resuming in the AM  Seronegative RA -C/w Hydroxychloroquine 200 mg po Daily   Depression and Anxiety -Continue Citalopram  HTN -Resuming home meds slowly d/t hypotensive episode yestedayAM;  -Holding her hydralazine, telmisartan, furosemide and metolazone -Continue with Amlodipine 2.5 mg daily  Iron Deficiency Anemia with concern for acute blood loss anemia -Caregiver states she has been having Dark hard stools  -Hemoglobin/hematocrit went from 8.2/27.2 and trended down to 6.9/22.3 and this morning was 7.2/23.4; she is typed and screened and transfused 1 unit PRBCs -Holding her home aspirin for now -Started on PPI with IV pantoprazole every 24 hours -Anemia panel done and showed an iron level of 38, U IBC 167, TIBC of 205, saturation ratios 19%, ferritin level 63, folate level greater than 100.0, vitamin B12 greater than 7500 -Continue to monitor for signs and symptoms of bleeding; currently no overt bleeding noted -Will check FOBT and this  was positive -continue to monitor and trend hemoglobins every 6 hours and last hemoglobin/hematocrit levels stable at 9.3/29.3 -Gastroenterology consulted for further evaluation and recommendations and they will be taking the patient for endoscopy in the morning -Repeat CBC in a.m.  AKI on CKD3b/Stage 4  -She was CKD3b up until the summer; her gfr worsened in September and has continued to through today.  -Her Diuretics were Held and will hold again for today -She normally takes furosemide 80 mg in the morning and 40 in the evening as well as metolazone 2.5 mg every Tuesday, Thursday, Saturday -Avoid further nephrotoxic medications, contrast dyes and hypotension and hold her naproxen to 20 mg p.o. twice daily as needed as well as telmisartan 80 mils p.o. daily -Patient's BUN/creatinine went from 38/2.6 and is now 29/1.72 yesterday and today it is 22/1.39; GFR is improved and went from 16 -> 28 and today is 36 -Continue monitor and trend renal function panel repeat CMP in a.m.  Diarrhea -Unclear etiology next-placed on enteric precautions and check GI pathogen panel -Also send C. difficile Patient had multiple loose bowel movements of unclear etiology but clinically do not suspect C. difficile given that she is afebrile with no leukocytosis -Gastroenterology has been consulted for further evaluation appreciate their recommendations and evaluation -  Follow-up on her diarrheal episodes  HLD -C/w Atorvastatin 40 mg MWF and Ezetimibe 10 mg po qHS  Hypomagnesemia -Patient's K+ was 3.3 and improved to 4.2 -Continue to Monitor and Replete as Necessary -Repeat Mag Level in the AM   Hypophosphatemia -Patient's phosphorus level was 2.3 -replete with p.o. K-Phos neutral -Continue monitor and replete as necessary -Repeat phosphorus level in the morning  Hypomagnesemia -Mag Level was 1.4 and improved to 1.7 -Replete with IV mag sulfate -Continue to Monitor and Replete as Necessary -Repeat  Mag Level in the AM   Obesity -Complicates overall prognosis and Care -Estimated body mass index is 29.74 kg/m as calculated from the following:   Height as of this encounter: 4\' 11"  (1.499 m).   Weight as of this encounter: 66.8 kg. -Weight Loss and Dietary Counseling given   DVT prophylaxis: SCDs given low Hgb Code Status: FULL CODE  Family Communication: Discussed with Niece via the Telephone yesterday Disposition Plan: Pending further clinical improvement and evaluation by cardiology and gastroenterology  Status is: Observation  The patient will require care spanning > 2 midnights and should be moved to inpatient because: Unsafe d/c plan, IV treatments appropriate due to intensity of illness or inability to take PO and Inpatient level of care appropriate due to severity of illness  Dispo: The patient is from: Home              Anticipated d/c is to: TBD              Anticipated d/c date is: 2 days              Patient currently is not medically stable to d/c.  Consultants:   Cardiology  Gastroenterology   Procedures:  ECHOCARDIOGRAM IMPRESSIONS    1. Hyperdynamic LV function; proximal septal thickening with systolic  anterior motion of MV; LVOT velocity of 2.5 m/s that increased to 3.5 m/s;  findings c/w HOCM.  2. Left ventricular ejection fraction, by estimation, is >75%. The left  ventricle has hyperdynamic function. The left ventricle has no regional  wall motion abnormalities. There is mild left ventricular hypertrophy.  Left ventricular diastolic parameters  are consistent with Grade I diastolic dysfunction (impaired relaxation).  3. Right ventricular systolic function is normal. The right ventricular  size is normal.  4. The mitral valve is normal in structure. No evidence of mitral valve  regurgitation. No evidence of mitral stenosis.  5. The aortic valve has an indeterminant number of cusps. Aortic valve  regurgitation is not visualized. No aortic  stenosis is present.  6. The inferior vena cava is normal in size with greater than 50%  respiratory variability, suggesting right atrial pressure of 3 mmHg.   FINDINGS  Left Ventricle: Left ventricular ejection fraction, by estimation, is  >75%. The left ventricle has hyperdynamic function. The left ventricle has  no regional wall motion abnormalities. The left ventricular internal  cavity size was normal in size. There  is mild left ventricular hypertrophy. Left ventricular diastolic  parameters are consistent with Grade I diastolic dysfunction (impaired  relaxation).   Right Ventricle: The right ventricular size is normal.Right ventricular  systolic function is normal.   Left Atrium: Left atrial size was normal in size.   Right Atrium: Right atrial size was normal in size.   Pericardium: There is no evidence of pericardial effusion.   Mitral Valve: The mitral valve is normal in structure. No evidence of  mitral valve regurgitation. No evidence of mitral valve stenosis.  MV peak  gradient, 7.8 mmHg. The mean mitral valve gradient is 2.0 mmHg.   Tricuspid Valve: The tricuspid valve is normal in structure. Tricuspid  valve regurgitation is trivial. No evidence of tricuspid stenosis.   Aortic Valve: The aortic valve has an indeterminant number of cusps.  Aortic valve regurgitation is not visualized. No aortic stenosis is  present. Aortic valve mean gradient measures 13.0 mmHg. Aortic valve peak  gradient measures 22.1 mmHg. Aortic valve  area, by VTI measures 1.76 cm.   Pulmonic Valve: The pulmonic valve was not well visualized. Pulmonic valve  regurgitation is not visualized. No evidence of pulmonic stenosis.   Aorta: The aortic root is normal in size and structure.   Venous: The inferior vena cava is normal in size with greater than 50%  respiratory variability, suggesting right atrial pressure of 3 mmHg.     Additional Comments: Hyperdynamic LV function; proximal  septal thickening  with systolic anterior motion of MV; LVOT velocity of 2.5 m/s that  increased to 3.5 m/s; findings c/w HOCM.     LEFT VENTRICLE  PLAX 2D  LVIDd:     2.93 cm   Diastology  LVIDs:     1.84 cm   LV e' medial:  8.43 cm/s  LV PW:     1.34 cm   LV E/e' medial: 10.1  LV IVS:    1.19 cm   LV e' lateral:  8.05 cm/s  LVOT diam:   1.75 cm   LV E/e' lateral: 10.6  LV SV:     77  LV SV Index:  47  LVOT Area:   2.41 cm    LV Volumes (MOD)  LV vol d, MOD A2C: 49.6 ml  LV vol d, MOD A4C: 44.6 ml  LV vol s, MOD A2C: 16.8 ml  LV vol s, MOD A4C: 10.2 ml  LV SV MOD A2C:   32.8 ml  LV SV MOD A4C:   44.6 ml  LV SV MOD BP:   36.6 ml   RIGHT VENTRICLE       IVC  RV S prime:   12.70 cm/s IVC diam: 1.36 cm  TAPSE (M-mode): 1.6 cm   LEFT ATRIUM       Index    RIGHT ATRIUM      Index  LA diam:    3.10 cm 1.90 cm/m RA Area:   13.20 cm  LA Vol (A2C):  36.1 ml 22.15 ml/m RA Volume:  28.80 ml 17.67 ml/m  LA Vol (A4C):  31.4 ml 19.27 ml/m  LA Biplane Vol: 35.0 ml 21.48 ml/m  AORTIC VALVE  AV Area (Vmax):  1.86 cm  AV Area (Vmean):  1.85 cm  AV Area (VTI):   1.76 cm  AV Vmax:      235.00 cm/s  AV Vmean:     169.000 cm/s  AV VTI:      0.439 m  AV Peak Grad:   22.1 mmHg  AV Mean Grad:   13.0 mmHg  LVOT Vmax:     182.00 cm/s  LVOT Vmean:    130.000 cm/s  LVOT VTI:     0.321 m  LVOT/AV VTI ratio: 0.73    AORTA  Ao Root diam: 3.00 cm   MITRAL VALVE  MV Area (PHT): 4.68 cm   SHUNTS  MV Peak grad: 7.8 mmHg   Systemic VTI: 0.32 m  MV Mean grad: 2.0 mmHg   Systemic Diam: 1.75 cm  MV Vmax:    1.40  m/s  MV Vmean:   62.8 cm/s  MV Decel Time: 162 msec  MV E velocity: 85.30 cm/s  MV A velocity: 127.00 cm/s  MV E/A ratio: 0.67   Antimicrobials: Anti-infectives (From admission, onward)   Start     Dose/Rate Route Frequency Ordered Stop    08/11/20 1000  hydroxychloroquine (PLAQUENIL) tablet 200 mg        200 mg Oral Daily 08/10/20 1752          Subjective: Seen and examined at bedside states she is doing a little bit better today but states that she is having continuous diarrhea and had a little bit of nausea.  No vomiting.  After further discussion she recalls having some dark stools with some blood in them.  Denies any chest pain but is still mildly short of breath but feels much improved after the unit of blood.  No other concerns or complaints at this time.  Objective: Vitals:   08/11/20 1932 08/11/20 2122 08/12/20 0446 08/12/20 0447  BP:  (!) 156/68  (!) 158/83  Pulse:  89  82  Resp:  16  15  Temp:  98 F (36.7 C)  (!) 97.5 F (36.4 C)  TempSrc:  Oral  Oral  SpO2:  95%  95%  Weight: 67.8 kg  66.8 kg   Height: 4\' 11"  (1.499 m)       Intake/Output Summary (Last 24 hours) at 08/12/2020 1856 Last data filed at 08/12/2020 0800 Gross per 24 hour  Intake 840 ml  Output --  Net 840 ml   Filed Weights   08/11/20 0540 08/11/20 1932 08/12/20 0446  Weight: 67.8 kg 67.8 kg 66.8 kg   Examination: Physical Exam:  Constitutional: WN/WD elderly obese African-American female currently in no acute distress appears mildly anxious again but more comfortable today than she did yesterday Eyes: Lids and conjunctivae normal, sclerae anicteric  ENMT: External Ears, Nose appear normal. Grossly normal hearing.  Neck: Appears normal, supple, no cervical masses, normal ROM, no appreciable thyromegaly, no appreciable JVD Respiratory: Diminished to auscultation bilaterally, no wheezing, rales, rhonchi or crackles. Normal respiratory effort and patient is not tachypenic. No accessory muscle use.  Unlabored breathing Cardiovascular: RRR, no murmurs / rubs / gallops. S1 and S2 auscultated.  Has some lower extremity 1+ edema Abdomen: Soft, non-tender, non-distended.  Bowel sounds positive.  GU: Deferred. Musculoskeletal: No clubbing /  cyanosis of digits/nails. No joint deformity upper and lower extremities.  Skin: No rashes, lesions, ulcers on limited skin evaluation. No induration; Warm and dry.  Neurologic: CN 2-12 grossly intact with no focal deficits. Romberg sign and cerebellar reflexes not assessed.  Psychiatric: Normal judgment and insight. Alert and oriented x 3. Normal mood and appropriate affect.   Data Reviewed: I have personally reviewed following labs and imaging studies  CBC: Recent Labs  Lab 08/10/20 0841 08/10/20 1535 08/10/20 2203 08/11/20 0650 08/11/20 2243 08/12/20 0625 08/12/20 1427  WBC 6.7  --   --   --   --  5.2  --   NEUTROABS 5.3  --   --   --   --  3.4  --   HGB 8.2*   < > 6.9* 7.2* 8.9* 9.4* 9.3*  HCT 27.2*   < > 22.3* 23.4* 29.1* 30.0* 29.3*  MCV 93.8  --   --   --   --  90.1  --   PLT 219  --   --   --   --  169  --    < > =  values in this interval not displayed.   Basic Metabolic Panel: Recent Labs  Lab 08/10/20 0841 08/11/20 0650 08/12/20 0625  NA 140 142 139  K 3.5 3.3* 4.2  CL 103 109 107  CO2 25 25 24   GLUCOSE 160* 95 90  BUN 38* 29* 22  CREATININE 2.67* 1.72* 1.39*  CALCIUM 8.4* 8.1* 8.5*  MG 1.4* 1.7 1.7  PHOS  --  2.5 2.3*   GFR: Estimated Creatinine Clearance: 21.4 mL/min (A) (by C-G formula based on SCr of 1.39 mg/dL (H)). Liver Function Tests: Recent Labs  Lab 08/11/20 0650 08/12/20 0625  AST 21 23  ALT 13 16  ALKPHOS 45 48  BILITOT 0.5 0.8  PROT 4.7* 5.2*  ALBUMIN 2.6* 2.7*   No results for input(s): LIPASE, AMYLASE in the last 168 hours. No results for input(s): AMMONIA in the last 168 hours. Coagulation Profile: No results for input(s): INR, PROTIME in the last 168 hours. Cardiac Enzymes: No results for input(s): CKTOTAL, CKMB, CKMBINDEX, TROPONINI in the last 168 hours. BNP (last 3 results) No results for input(s): PROBNP in the last 8760 hours. HbA1C: No results for input(s): HGBA1C in the last 72 hours. CBG: Recent Labs  Lab  08/11/20 2126 08/12/20 0443 08/12/20 0808 08/12/20 1130 08/12/20 1744  GLUCAP 95 81 84 131* 176*   Lipid Profile: No results for input(s): CHOL, HDL, LDLCALC, TRIG, CHOLHDL, LDLDIRECT in the last 72 hours. Thyroid Function Tests: No results for input(s): TSH, T4TOTAL, FREET4, T3FREE, THYROIDAB in the last 72 hours. Anemia Panel: Recent Labs    08/10/20 0841  VITAMINB12 >7,500*  FOLATE >100.0  FERRITIN 63  TIBC 205*  IRON 38  RETICCTPCT 2.1   Sepsis Labs: No results for input(s): PROCALCITON, LATICACIDVEN in the last 168 hours.  Recent Results (from the past 240 hour(s))  Resp Panel by RT-PCR (Flu A&B, Covid) Nasopharyngeal Swab     Status: None   Collection Time: 08/10/20 10:22 AM   Specimen: Nasopharyngeal Swab; Nasopharyngeal(NP) swabs in vial transport medium  Result Value Ref Range Status   SARS Coronavirus 2 by RT PCR NEGATIVE NEGATIVE Final    Comment: (NOTE) SARS-CoV-2 target nucleic acids are NOT DETECTED.  The SARS-CoV-2 RNA is generally detectable in upper respiratory specimens during the acute phase of infection. The lowest concentration of SARS-CoV-2 viral copies this assay can detect is 138 copies/mL. A negative result does not preclude SARS-Cov-2 infection and should not be used as the sole basis for treatment or other patient management decisions. A negative result may occur with  improper specimen collection/handling, submission of specimen other than nasopharyngeal swab, presence of viral mutation(s) within the areas targeted by this assay, and inadequate number of viral copies(<138 copies/mL). A negative result must be combined with clinical observations, patient history, and epidemiological information. The expected result is Negative.  Fact Sheet for Patients:  EntrepreneurPulse.com.au  Fact Sheet for Healthcare Providers:  IncredibleEmployment.be  This test is no t yet approved or cleared by the [place] FDA and  has been authorized for detection and/or diagnosis of SARS-CoV-2 by FDA under an Emergency Use Authorization (EUA). This EUA will remain  in effect (meaning this test can be used) for the duration of the COVID-19 declaration under Section 564(b)(1) of the Act, 21 U.S.C.section 360bbb-3(b)(1), unless the authorization is terminated  or revoked sooner.       Influenza A by PCR NEGATIVE NEGATIVE Final   Influenza B by PCR NEGATIVE NEGATIVE Final    Comment: (NOTE) The  Xpert Xpress SARS-CoV-2/FLU/RSV plus assay is intended as an aid in the diagnosis of influenza from Nasopharyngeal swab specimens and should not be used as a sole basis for treatment. Nasal washings and aspirates are unacceptable for Xpert Xpress SARS-CoV-2/FLU/RSV testing.  Fact Sheet for Patients: EntrepreneurPulse.com.au  Fact Sheet for Healthcare Providers: IncredibleEmployment.be  This test is not yet approved or cleared by the [place] FDA and has been authorized for detection and/or diagnosis of SARS-CoV-2 by FDA under an Emergency Use Authorization (EUA). This EUA will remain in effect (meaning this test can be used) for the duration of the COVID-19 declaration under Section 564(b)(1) of the Act, 21 U.S.C. section 360bbb-3(b)(1), unless the authorization is terminated or revoked.  Performed at [facility] [place] [address] [place],  [address]      RN Pressure Injury Documentation:     Estimated body mass index is 29.74 kg/m as calculated from the following:   Height as of this encounter: 4\' 11"  (1.499 m).   Weight as of this encounter: 66.8 kg.  Malnutrition Type:    Malnutrition Characteristics:   Nutrition Interventions:   Radiology Studies: ECHOCARDIOGRAM COMPLETE  Result Date: 08/11/2020    ECHOCARDIOGRAM REPORT   Patient Name:   BRUCE MAYERS Ezzell Date of Exam: 08/11/2020 Medical Rec #:  [id number]         Height:       59.0 in Accession #:    [id number]       Weight:       149.5 lb Date of Birth:  19-Jun-1926         BSA:          1.630 m Patient Age:    51 years         BP:           122/59 mmHg Patient Gender: F                HR:           86 bpm. Exam Location:  Inpatient Procedure: 2D Echo, Cardiac Doppler and Color Doppler Indications:    R55 Syncope  History:        Patient has prior history of Echocardiogram examinations, most                 recent 04/20/2019. Abnormal ECG, TIA, Signs/Symptoms:Dyspnea and                 Shortness of Breath; Risk Factors:Dyslipidemia, Hypertension and                 Diabetes.  Sonographer:    Roseanna Rainbow RDCS Referring Phys: [id number] Jonnie Finner  Sonographer Comments: Technically difficult study due to poor echo windows. IMPRESSIONS  1. Hyperdynamic LV function; proximal septal thickening with systolic anterior motion of MV; LVOT velocity of 2.5 m/s that increased to 3.5 m/s; findings c/w HOCM.  2. Left ventricular ejection fraction, by estimation, is >75%. The left ventricle has hyperdynamic function. The left ventricle has no regional wall motion abnormalities. There is mild left ventricular hypertrophy. Left ventricular diastolic parameters are consistent with Grade I diastolic dysfunction (impaired relaxation).  3. Right ventricular systolic function is normal. The right ventricular size is normal.  4. The mitral valve is normal in structure. No evidence of mitral valve regurgitation. No evidence of mitral stenosis.  5. The aortic valve has an indeterminant number of cusps. Aortic valve regurgitation is not visualized. No aortic stenosis is present.  6. The inferior vena cava is normal in size with greater than 50% respiratory variability, suggesting right atrial pressure of 3 mmHg. FINDINGS  Left Ventricle: Left ventricular ejection fraction, by estimation, is >75%. The left ventricle has hyperdynamic function. The left ventricle has no regional wall motion abnormalities.  The left ventricular internal cavity size was normal in size. There is mild left ventricular hypertrophy. Left ventricular diastolic parameters are consistent with Grade I diastolic dysfunction (impaired relaxation). Right Ventricle: The right ventricular size is normal.Right ventricular systolic function is normal. Left Atrium: Left atrial size was normal in size. Right Atrium: Right atrial size was normal in size. Pericardium: There is no evidence of pericardial effusion. Mitral Valve: The mitral valve is normal in structure. No evidence of mitral valve regurgitation. No evidence of mitral valve stenosis. MV peak gradient, 7.8 mmHg. The mean mitral valve gradient is 2.0 mmHg. Tricuspid Valve: The tricuspid valve is normal in structure. Tricuspid valve regurgitation is trivial. No evidence of tricuspid stenosis. Aortic Valve: The aortic valve has an indeterminant number of cusps. Aortic valve regurgitation is not visualized. No aortic stenosis is present. Aortic valve mean gradient measures 13.0 mmHg. Aortic valve peak gradient measures 22.1 mmHg. Aortic valve area, by VTI measures 1.76 cm. Pulmonic Valve: The pulmonic valve was not well visualized. Pulmonic valve regurgitation is not visualized. No evidence of pulmonic stenosis. Aorta: The aortic root is normal in size and structure. Venous: The inferior vena cava is normal in size with greater than 50% respiratory variability, suggesting right atrial pressure of 3 mmHg.  Additional Comments: Hyperdynamic LV function; proximal septal thickening with systolic anterior motion of MV; LVOT velocity of 2.5 m/s that increased to 3.5 m/s; findings c/w HOCM.  LEFT VENTRICLE PLAX 2D LVIDd:         2.93 cm     Diastology LVIDs:         1.84 cm     LV e' medial:    8.43 cm/s LV PW:         1.34 cm     LV E/e' medial:  10.1 LV IVS:        1.19 cm     LV e' lateral:   8.05 cm/s LVOT diam:     1.75 cm     LV E/e' lateral: 10.6 LV SV:         77 LV SV Index:   47 LVOT Area:      2.41 cm  LV Volumes (MOD) LV vol d, MOD A2C: 49.6 ml LV vol d, MOD A4C: 44.6 ml LV vol s, MOD A2C: 16.8 ml LV vol s, MOD A4C: 10.2 ml LV SV MOD A2C:     32.8 ml LV SV MOD A4C:     44.6 ml LV SV MOD BP:      36.6 ml RIGHT VENTRICLE             IVC RV S prime:     12.70 cm/s  IVC diam: 1.36 cm TAPSE (M-mode): 1.6 cm LEFT ATRIUM             Index       RIGHT ATRIUM           Index LA diam:        3.10 cm 1.90 cm/m  RA Area:     13.20 cm LA Vol (A2C):   36.1 ml 22.15 ml/m RA Volume:   28.80 ml  17.67 ml/m LA Vol (A4C):   31.4  ml 19.27 ml/m LA Biplane Vol: 35.0 ml 21.48 ml/m  AORTIC VALVE AV Area (Vmax):    1.86 cm AV Area (Vmean):   1.85 cm AV Area (VTI):     1.76 cm AV Vmax:           235.00 cm/s AV Vmean:          169.000 cm/s AV VTI:            0.439 m AV Peak Grad:      22.1 mmHg AV Mean Grad:      13.0 mmHg LVOT Vmax:         182.00 cm/s LVOT Vmean:        130.000 cm/s LVOT VTI:          0.321 m LVOT/AV VTI ratio: 0.73  AORTA Ao Root diam: 3.00 cm MITRAL VALVE MV Area (PHT): 4.68 cm     SHUNTS MV Peak grad:  7.8 mmHg     Systemic VTI:  0.32 m MV Mean grad:  2.0 mmHg     Systemic Diam: 1.75 cm MV Vmax:       1.40 m/s MV Vmean:      62.8 cm/s MV Decel Time: 162 msec MV E velocity: 85.30 cm/s MV A velocity: 127.00 cm/s MV E/A ratio:  0.67 Kirk Ruths MD Electronically signed by Kirk Ruths MD Signature Date/Time: 08/11/2020/1:08:20 PM    Final    Scheduled Meds: . atorvastatin  40 mg Oral Q M,W,F  . citalopram  20 mg Oral Daily  . donepezil  10 mg Oral QHS  . ezetimibe  10 mg Oral QHS  . hydroxychloroquine  200 mg Oral Daily  . insulin aspart  0-9 Units Subcutaneous TID WC  . metoprolol succinate  25 mg Oral Daily  . pantoprazole (PROTONIX) IV  40 mg Intravenous Q24H  . phosphorus  500 mg Oral BID  . potassium chloride  20 mEq Oral Daily  . sodium chloride flush  3 mL Intravenous Q12H   Continuous Infusions:   LOS: 0 days   Kerney Elbe, DO [facility] PAGER is on  AMION  If 7PM-7AM, please contact night-coverage [email or web address]

## 2020-08-12 NOTE — Evaluation (Signed)
Occupational Therapy Evaluation Patient Details Name: Maria [name] MRN: [id number] DOB: 05/17/1926 Today's Date: 08/12/2020    History of Present Illness Pt is 86 yo female with PMH including DM2, CKD, HF, HTN, and OSA.  Pt presented to the ED with syncopal symptoms.  Her hgb was 7.2 on 08/11/20 and she received 1 unit PRBC.  Cardiology has also been consulted.   Clinical Impression   Maria [name] is a 86 year old woman who normally lives at home and has a caregiver for a couple of hours a day that helps her with bathing and IADLs but typically modified independent with dressing and toileting with use of rollator and Depends. On evaluation presents with decreased activity tolerance, generalized weakness and reports of right knee pain. Patient supervision for bed mobility, min assist with +2 for safety with standing. Patient complaining of dizziness with sitting and standing and became unresponsive when attempting to get standing BP. Patient requiring increased assistance with ADLs and standing due to syncopal episode (see general comments) and inability to ambulate at this time. Patient will benefit from skilled OT services while in hospital to improve deficits and learn compensatory strategies as needed in order to return PLOF.  At this time patient is not safe to return home as she is unable to ambulate and perform baseline ADLs therefore recommend short term rehab. If patient improves and able to mobilize without syncopal episode may be able to return home with [facility].    Follow Up Recommendations  Home health OT;SNF;Supervision/Assistance - 24 hour    Equipment Recommendations  None recommended by OT    Recommendations for Other Services       Precautions / Restrictions Precautions Precautions: Fall Precaution Comments: syncope Restrictions Weight Bearing Restrictions: No      Mobility Bed Mobility Overal bed mobility: Needs Assistance Bed Mobility: Supine to Sit;Sit to  Supine     Supine to sit: Supervision Sit to supine: Supervision        Transfers Overall transfer level: Needs assistance Equipment used: Rolling walker (2 wheeled) Transfers: Sit to/from Stand Sit to Stand: Min assist;+2 safety/equipment         General transfer comment: Performed x 2 with min A of 2 for safety due to c/o dizziness    Balance Overall balance assessment: Needs assistance Sitting-balance support: Bilateral upper extremity supported;No upper extremity supported Sitting balance-Leahy Scale: Fair Sitting balance - Comments: Static sit without UE support but when donning brief had LOB posteriorly     Standing balance-Leahy Scale: Poor Standing balance comment: requiring RW and assist                           ADL either performed or assessed with clinical judgement   ADL Overall ADL's : Needs assistance/impaired Eating/Feeding: Set up   Grooming: Set up   Upper Body Bathing: Set up   Lower Body Bathing: Moderate assistance;Sit to/from stand;+2 for safety/equipment;Sitting/lateral leans Lower Body Bathing Details (indicate cue type and reason): +2 to assistance with standing due to dizziness/syncope - one person to steady patient and the other to assist with washing buttocks/back of legs. Upper Body Dressing : Set up   Lower Body Dressing: +2 for safety/equipment;Maximal assistance;Sit to/from stand Lower Body Dressing Details (indicate cue type and reason): Patient neeing more assistance with LB dressing due to needing to hold onto walker and syncope/dizziness. Able to assist with donning clothing from seated position but limited by height of  bed. Toilet Transfer: +2 for safety/equipment;Moderate assistance;Stand-pivot;BSC   Toileting- Clothing Manipulation and Hygiene: Maximal assistance;Sit to/from stand Toileting - Clothing Manipulation Details (indicate cue type and reason): Patient attempting to assist with pulling up brief.              Vision Patient Visual Report: No change from baseline       Perception     Praxis      Pertinent Vitals/Pain Pain Assessment: Faces Faces Pain Scale: Hurts little more Pain Location: R knee - with weight bearing Pain Descriptors / Indicators: Grimacing Pain Intervention(s): Limited activity within patient's tolerance     Hand Dominance     Extremity/Trunk Assessment Upper Extremity Assessment Upper Extremity Assessment: Overall WFL for tasks assessed   Lower Extremity Assessment Lower Extremity Assessment: Defer to PT evaluation RLE Deficits / Details: Grossly WFL and demonstrating functional strength - however limited testing due to dizziness/syncope LLE Deficits / Details: Grossly WFL and demonstrating functional strength - however limited testing due to dizziness/syncope   Cervical / Trunk Assessment Cervical / Trunk Assessment: Normal   Communication Communication Communication: No difficulties   Cognition Arousal/Alertness: Awake/alert Behavior During Therapy: WFL for tasks assessed/performed Overall Cognitive Status: Within Functional Limits for tasks assessed                                 General Comments: Pt quick to answer questions and lively - after syncopal episode took several minutes to return to this level   General Comments    Pt had c/o dizziness upon sitting that resolved after a few mins.  She then stood and reports dizziness again and then became unresponsive (unable to get BP in standing) having to return to sitting and supine.  Her VSS (see below). Pt took 30 sec-73min to recover alertness and 4-5 mins to fully recover cognitively.  RN and MD notified.  After several mins, pt wanted to attempt again while PT/OT present.  Again became dizzy at EOB but BP stable.  She stood and reports dizziness/syncopal symptoms but did not fully become syncopal - did have decreased responses, voice volume, and with shakiness.  This time BP did  decrease in standing.  RN and MD notified. Vitals below   HR 66-82 bpm throughout session Blood pressures Supine: 137/66 Sitting: 152/68 Standing: - unable to obtain in standing , pt became unresponsive BP that was started in standing finished in sitting: 147/74  Return to supine: 146/83 Pt wanted to attempt again: Sitting: 138/73 Sitting 3 mins : 136/73 Standing: 115/75 Standing 1-2 mins: 119/80     Exercises     Shoulder Instructions      Home Living Family/patient expects to be discharged to:: Private residence Living Arrangements: Alone Available Help at Discharge: Personal care attendant (5 days a week 6am - 830 am) Type of Home: Apartment Home Access: Ramped entrance     Home Layout: One level     Bathroom Shower/Tub: Occupational psychologist: Standard     Home Equipment: Bedside commode;Walker - 2 wheels;Walker - 4 wheels;Shower seat          Prior Functioning/Environment Level of Independence: Needs assistance  Gait / Transfers Assistance Needed: Always uses rollator; able to ambulate household distance without rest but needed rest breaks on rollator to go further ADL's / Homemaking Assistance Needed: Aide assist with bathing but pt able to do toielting and dressing  OT Problem List: Decreased activity tolerance;Impaired balance (sitting and/or standing);Decreased knowledge of use of DME or AE;Pain;Decreased strength      OT Treatment/Interventions: Self-care/ADL training;Therapeutic exercise;DME and/or AE instruction;Therapeutic activities;Balance training;Patient/family education    OT Goals(Current goals can be found in the care plan section) Acute Rehab OT Goals Patient Stated Goal: figure out why she is syncopal before going home OT Goal Formulation: With patient Time For Goal Achievement: 08/26/20 Potential to Achieve Goals: Good  OT Frequency: Min 2X/week   Barriers to D/C: Decreased caregiver support           Co-evaluation PT/OT/SLP Co-Evaluation/Treatment: Yes Reason for Co-Treatment: For patient/therapist safety PT goals addressed during session: Mobility/safety with mobility OT goals addressed during session: ADL's and self-care;Proper use of Adaptive equipment and DME      AM-PAC OT "6 Clicks" Daily Activity     Outcome Measure Help from another person eating meals?: A Little Help from another person taking care of personal grooming?: A Little Help from another person toileting, which includes using toliet, bedpan, or urinal?: A Lot Help from another person bathing (including washing, rinsing, drying)?: A Lot Help from another person to put on and taking off regular upper body clothing?: A Little Help from another person to put on and taking off regular lower body clothing?: A Lot 6 Click Score: 15   End of Session Equipment Utilized During Treatment: Rolling walker Nurse Communication: Mobility status (BP, passing out)  Activity Tolerance: Other (comment) (syncopal episode.) Patient left:    OT Visit Diagnosis: Unsteadiness on feet (R26.81);Pain;Dizziness and giddiness (R42) Pain - Right/Left: Right Pain - part of body: Knee                Time: 3159-4585 OT Time Calculation (min): 40 min Charges:  OT General Charges $OT Visit: 1 Visit OT Evaluation $OT Eval Moderate Complexity: 1 Mod  Tascha Casares, OTR/L [place]  Office [phone] Pager: [place] 08/12/2020, 1:29 PM

## 2020-08-12 NOTE — Evaluation (Signed)
Physical Therapy Evaluation Patient Details Name: Maria [name] MRN: [id number] DOB: 1926/04/21 Today's Date: 08/12/2020   History of Present Illness  Pt is 86 yo female with PMH including DM2, CKD, HF, HTN, and OSA.  Pt presented to the ED with syncopal symptoms.  Her hgb was 7.2 on 08/11/20 and she received 1 unit PRBC.  Cardiology has also been consulted.  Clinical Impression   Pt admitted with above diagnosis. Evaluation was limited due to syncopal symptoms.  Pt was able to get to EOB with supervision but after sitting there for a few mins had c/o syncope/dizziness but BP was stable.  With first stand , pt reports dizziness then became unresponsive and had to return to sitting (BP upon sitting stable) then supine.  Pt was out for 30 sec-1 minute but took 4-5 minutes to return to baseline cognition.  Pt wanted to attempt again.  This time she did have symptoms in standing but not as severe - bp did drop to 115/75 in standing.  See below for all blood pressures.  Pt resides alone with an aide to assist a couple hours 5 days a week.  She demonstrated good bed mobility but unable to further assess due to syncope.  Due to living alone, pt may need SNF at d/c but will further assess as syncope managed.   Pt currently with functional limitations due to the deficits listed below (see PT Problem List). Pt will benefit from skilled PT to increase their independence and safety with mobility to allow discharge to the venue listed below.       Follow Up Recommendations Other (comment) (SNF vs HHPT - eval limited due to syncope, needs further assessment)    Equipment Recommendations  None recommended by PT    Recommendations for Other Services       Precautions / Restrictions Precautions Precautions: Fall Precaution Comments: syncope      Mobility  Bed Mobility Overal bed mobility: Needs Assistance Bed Mobility: Supine to Sit;Sit to Supine     Supine to sit: Supervision Sit to supine:  Supervision        Transfers Overall transfer level: Needs assistance Equipment used: Rolling walker (2 wheeled) Transfers: Sit to/from Stand Sit to Stand: Min assist;+2 safety/equipment         General transfer comment: Performed x 2 with min A of 2 for safety due to c/o dizziness  Ambulation/Gait             General Gait Details: unable due to syncopal symptoms  Stairs            Wheelchair Mobility    Modified Rankin (Stroke Patients Only)       Balance Overall balance assessment: Needs assistance Sitting-balance support: Bilateral upper extremity supported;No upper extremity supported Sitting balance-Leahy Scale: Fair Sitting balance - Comments: Static sith without UE support but when donning brief had LOB posteriorly     Standing balance-Leahy Scale: Poor Standing balance comment: requiring RW and assist                             Pertinent Vitals/Pain Pain Assessment: No/denies pain    Home Living Family/patient expects to be discharged to:: Private residence Living Arrangements: Alone Available Help at Discharge: Personal care attendant (5 days a week 6am - 830 am) Type of Home: Apartment Home Access: Ramped entrance     Home Layout: One level Home Equipment: Bedside commode;Walker - 2 wheels;Walker -  4 wheels;Shower seat      Prior Function Level of Independence: Needs assistance   Gait / Transfers Assistance Needed: Always uses rollator; able to ambulate household distance without rest but needed rest breaks on rollator to go further  ADL's / Homemaking Assistance Needed: Aide assist with bathing but pt able to do toielting and dressing        Hand Dominance        Extremity/Trunk Assessment   Upper Extremity Assessment Upper Extremity Assessment: Defer to OT evaluation    Lower Extremity Assessment Lower Extremity Assessment: LLE deficits/detail;RLE deficits/detail RLE Deficits / Details: Grossly WFL and  demonstrating functional strength - however limited testing due to dizziness/syncope LLE Deficits / Details: Grossly WFL and demonstrating functional strength - however limited testing due to dizziness/syncope    Cervical / Trunk Assessment Cervical / Trunk Assessment: Normal  Communication   Communication: No difficulties  Cognition Arousal/Alertness: Awake/alert Behavior During Therapy: WFL for tasks assessed/performed Overall Cognitive Status: Within Functional Limits for tasks assessed                                 General Comments: Pt quick to answer questions and lively - after syncopal episode took several minutes to return to this level      General Comments   Pt had c/o dizziness upon sitting that resolved after a few mins.  She then stood and reports dizziness again and then became unresponsive (unable to get BP in standing) having to return to sitting and supine.  Her VSS (see below). Pt took 30 sec-66min to recover alertness and 4-5 mins to fully recover cognitively.  RN and MD notified.  After several mins, pt wanted to attempt again while PT/OT present.  Again became dizzy at EOB but BP stable.  She stood and reports dizziness/syncopal symptoms but did not fully become syncopal - did have decreased responses, voice volume, and with shakiness.  This time BP did decrease in standing.  RN and MD notified. Vitals below   HR 66-82 bpm throughout session Blood pressures Supine: 137/66 Sitting: 152/68 Standing: - unable to obtain in standing , pt became unresponsive BP that was started in standing finished in sitting: 147/74  Return to supine: 146/83 Pt wanted to attempt again: Sitting: 138/73 Sitting 3 mins : 136/73 Standing: 115/75 Standing 1-2 mins: 119/80   Exercises     Assessment/Plan    PT Assessment Patient needs continued PT services  PT Problem List Decreased strength;Decreased mobility;Decreased activity tolerance;Cardiopulmonary status limiting  activity;Decreased balance;Decreased knowledge of use of DME       PT Treatment Interventions DME instruction;Therapeutic activities;Gait training;Therapeutic exercise;Patient/family education;Balance training;Functional mobility training    PT Goals (Current goals can be found in the Care Plan section)  Acute Rehab PT Goals Patient Stated Goal: figure out why she is syncopal PT Goal Formulation: With patient Time For Goal Achievement: 08/26/20 Potential to Achieve Goals: Good    Frequency Min 3X/week   Barriers to discharge Decreased caregiver support      Co-evaluation PT/OT/SLP Co-Evaluation/Treatment: Yes Reason for Co-Treatment: For patient/therapist safety;Complexity of the patient's impairments (multi-system involvement) PT goals addressed during session: Mobility/safety with mobility;Proper use of DME OT goals addressed during session: ADL's and self-care;Proper use of Adaptive equipment and DME       AM-PAC PT "6 Clicks" Mobility  Outcome Measure Help needed turning from your back to your side while in a flat  bed without using bedrails?: A Little Help needed moving from lying on your back to sitting on the side of a flat bed without using bedrails?: A Little Help needed moving to and from a bed to a chair (including a wheelchair)?: A Little Help needed standing up from a chair using your arms (e.g., wheelchair or bedside chair)?: A Little Help needed to walk in hospital room?: A Lot Help needed climbing 3-5 steps with a railing? : A Lot 6 Click Score: 16    End of Session Equipment Utilized During Treatment: Gait belt Activity Tolerance: Other (comment) (limited by syncope) Patient left: in bed;with call bell/phone within reach;with bed alarm set Nurse Communication: Mobility status;Other (comment) (syncopal) PT Visit Diagnosis: Other abnormalities of gait and mobility (R26.89);Muscle weakness (generalized) (M62.81);Dizziness and giddiness (R42)    Time:  1610-9604 PT Time Calculation (min) (ACUTE ONLY): 45 min   Charges:   PT Evaluation $PT Eval Moderate Complexity: 1 Mod PT Treatments $Therapeutic Activity: 8-22 mins        Abran Richard, PT Acute Rehab Services Pager [phone] [facility] [phone]    Karlton Lemon 08/12/2020, 12:52 PM

## 2020-08-13 ENCOUNTER — Encounter (HOSPITAL_COMMUNITY): Payer: Self-pay | Admitting: Internal Medicine

## 2020-08-13 ENCOUNTER — Encounter (HOSPITAL_COMMUNITY)
Admission: EM | Disposition: A | Discharge status: Home Health | Payer: Self-pay | Source: Home / Self Care | Attending: Internal Medicine

## 2020-08-13 ENCOUNTER — Observation Stay (HOSPITAL_COMMUNITY): Payer: Medicare Other | Admitting: Anesthesiology

## 2020-08-13 DIAGNOSIS — K2211 Ulcer of esophagus with bleeding: Secondary | ICD-10-CM

## 2020-08-13 DIAGNOSIS — I160 Hypertensive urgency: Secondary | ICD-10-CM | POA: Diagnosis not present

## 2020-08-13 DIAGNOSIS — K209 Esophagitis, unspecified without bleeding: Secondary | ICD-10-CM

## 2020-08-13 DIAGNOSIS — I6602 Occlusion and stenosis of left middle cerebral artery: Secondary | ICD-10-CM | POA: Diagnosis not present

## 2020-08-13 DIAGNOSIS — Z20822 Contact with and (suspected) exposure to covid-19: Secondary | ICD-10-CM | POA: Diagnosis present

## 2020-08-13 DIAGNOSIS — K222 Esophageal obstruction: Secondary | ICD-10-CM | POA: Diagnosis present

## 2020-08-13 DIAGNOSIS — Z8719 Personal history of other diseases of the digestive system: Secondary | ICD-10-CM | POA: Diagnosis not present

## 2020-08-13 DIAGNOSIS — Z7189 Other specified counseling: Secondary | ICD-10-CM | POA: Diagnosis not present

## 2020-08-13 DIAGNOSIS — Z515 Encounter for palliative care: Secondary | ICD-10-CM | POA: Diagnosis not present

## 2020-08-13 DIAGNOSIS — I130 Hypertensive heart and chronic kidney disease with heart failure and stage 1 through stage 4 chronic kidney disease, or unspecified chronic kidney disease: Secondary | ICD-10-CM | POA: Diagnosis present

## 2020-08-13 DIAGNOSIS — E1122 Type 2 diabetes mellitus with diabetic chronic kidney disease: Secondary | ICD-10-CM | POA: Diagnosis present

## 2020-08-13 DIAGNOSIS — R1312 Dysphagia, oropharyngeal phase: Secondary | ICD-10-CM | POA: Diagnosis not present

## 2020-08-13 DIAGNOSIS — N170 Acute kidney failure with tubular necrosis: Secondary | ICD-10-CM | POA: Diagnosis not present

## 2020-08-13 DIAGNOSIS — K2960 Other gastritis without bleeding: Secondary | ICD-10-CM | POA: Diagnosis present

## 2020-08-13 DIAGNOSIS — R1310 Dysphagia, unspecified: Secondary | ICD-10-CM | POA: Diagnosis not present

## 2020-08-13 DIAGNOSIS — K279 Peptic ulcer, site unspecified, unspecified as acute or chronic, without hemorrhage or perforation: Secondary | ICD-10-CM | POA: Diagnosis not present

## 2020-08-13 DIAGNOSIS — Z66 Do not resuscitate: Secondary | ICD-10-CM | POA: Diagnosis not present

## 2020-08-13 DIAGNOSIS — K319 Disease of stomach and duodenum, unspecified: Secondary | ICD-10-CM | POA: Diagnosis present

## 2020-08-13 DIAGNOSIS — E669 Obesity, unspecified: Secondary | ICD-10-CM | POA: Diagnosis present

## 2020-08-13 DIAGNOSIS — D62 Acute posthemorrhagic anemia: Secondary | ICD-10-CM | POA: Diagnosis present

## 2020-08-13 DIAGNOSIS — K2210 Ulcer of esophagus without bleeding: Secondary | ICD-10-CM | POA: Diagnosis present

## 2020-08-13 DIAGNOSIS — E785 Hyperlipidemia, unspecified: Secondary | ICD-10-CM | POA: Diagnosis present

## 2020-08-13 DIAGNOSIS — M109 Gout, unspecified: Secondary | ICD-10-CM | POA: Diagnosis present

## 2020-08-13 DIAGNOSIS — R55 Syncope and collapse: Secondary | ICD-10-CM | POA: Diagnosis not present

## 2020-08-13 DIAGNOSIS — K449 Diaphragmatic hernia without obstruction or gangrene: Secondary | ICD-10-CM | POA: Diagnosis present

## 2020-08-13 DIAGNOSIS — D500 Iron deficiency anemia secondary to blood loss (chronic): Secondary | ICD-10-CM | POA: Diagnosis not present

## 2020-08-13 DIAGNOSIS — Z4659 Encounter for fitting and adjustment of other gastrointestinal appliance and device: Secondary | ICD-10-CM | POA: Diagnosis not present

## 2020-08-13 DIAGNOSIS — I5032 Chronic diastolic (congestive) heart failure: Secondary | ICD-10-CM | POA: Diagnosis present

## 2020-08-13 DIAGNOSIS — N179 Acute kidney failure, unspecified: Secondary | ICD-10-CM | POA: Diagnosis not present

## 2020-08-13 DIAGNOSIS — N9089 Other specified noninflammatory disorders of vulva and perineum: Secondary | ICD-10-CM | POA: Diagnosis not present

## 2020-08-13 DIAGNOSIS — F419 Anxiety disorder, unspecified: Secondary | ICD-10-CM | POA: Diagnosis present

## 2020-08-13 DIAGNOSIS — K922 Gastrointestinal hemorrhage, unspecified: Secondary | ICD-10-CM | POA: Diagnosis not present

## 2020-08-13 DIAGNOSIS — T502X5A Adverse effect of carbonic-anhydrase inhibitors, benzothiadiazides and other diuretics, initial encounter: Secondary | ICD-10-CM | POA: Diagnosis present

## 2020-08-13 DIAGNOSIS — E876 Hypokalemia: Secondary | ICD-10-CM | POA: Diagnosis present

## 2020-08-13 DIAGNOSIS — D638 Anemia in other chronic diseases classified elsewhere: Secondary | ICD-10-CM | POA: Diagnosis not present

## 2020-08-13 DIAGNOSIS — N1832 Chronic kidney disease, stage 3b: Secondary | ICD-10-CM | POA: Diagnosis present

## 2020-08-13 DIAGNOSIS — I63412 Cerebral infarction due to embolism of left middle cerebral artery: Secondary | ICD-10-CM | POA: Diagnosis not present

## 2020-08-13 DIAGNOSIS — E7841 Elevated Lipoprotein(a): Secondary | ICD-10-CM | POA: Diagnosis not present

## 2020-08-13 DIAGNOSIS — I639 Cerebral infarction, unspecified: Secondary | ICD-10-CM | POA: Diagnosis not present

## 2020-08-13 DIAGNOSIS — K921 Melena: Secondary | ICD-10-CM | POA: Diagnosis not present

## 2020-08-13 DIAGNOSIS — M0600 Rheumatoid arthritis without rheumatoid factor, unspecified site: Secondary | ICD-10-CM | POA: Diagnosis present

## 2020-08-13 DIAGNOSIS — E860 Dehydration: Secondary | ICD-10-CM | POA: Diagnosis present

## 2020-08-13 DIAGNOSIS — K254 Chronic or unspecified gastric ulcer with hemorrhage: Secondary | ICD-10-CM | POA: Diagnosis present

## 2020-08-13 DIAGNOSIS — F32A Depression, unspecified: Secondary | ICD-10-CM | POA: Diagnosis present

## 2020-08-13 HISTORY — PX: HEMOSTASIS CLIP PLACEMENT: SHX6857

## 2020-08-13 HISTORY — PX: BIOPSY: SHX5522

## 2020-08-13 HISTORY — PX: ESOPHAGOGASTRODUODENOSCOPY (EGD) WITH PROPOFOL: SHX5813

## 2020-08-13 LAB — CBC WITH DIFFERENTIAL/PLATELET
Abs Immature Granulocytes: 0.02 10*3/uL (ref 0.00–0.07)
Basophils Absolute: 0 10*3/uL (ref 0.0–0.1)
Basophils Relative: 0 %
Eosinophils Absolute: 0.1 10*3/uL (ref 0.0–0.5)
Eosinophils Relative: 2 %
HCT: 30.6 % — ABNORMAL LOW (ref 36.0–46.0)
Hemoglobin: 9.4 g/dL — ABNORMAL LOW (ref 12.0–15.0)
Immature Granulocytes: 0 %
Lymphocytes Relative: 20 %
Lymphs Abs: 1.3 10*3/uL (ref 0.7–4.0)
MCH: 27.9 pg (ref 26.0–34.0)
MCHC: 30.7 g/dL (ref 30.0–36.0)
MCV: 90.8 fL (ref 80.0–100.0)
Monocytes Absolute: 0.5 10*3/uL (ref 0.1–1.0)
Monocytes Relative: 9 %
Neutro Abs: 4.4 10*3/uL (ref 1.7–7.7)
Neutrophils Relative %: 69 %
Platelets: 193 10*3/uL (ref 150–400)
RBC: 3.37 MIL/uL — ABNORMAL LOW (ref 3.87–5.11)
RDW: 18.4 % — ABNORMAL HIGH (ref 11.5–15.5)
WBC: 6.4 10*3/uL (ref 4.0–10.5)
nRBC: 0 % (ref 0.0–0.2)

## 2020-08-13 LAB — GASTROINTESTINAL PANEL BY PCR, STOOL (REPLACES STOOL CULTURE)

## 2020-08-13 LAB — COMPREHENSIVE METABOLIC PANEL
ALT: 16 U/L (ref 0–44)
AST: 23 U/L (ref 15–41)
Albumin: 2.5 g/dL — ABNORMAL LOW (ref 3.5–5.0)
Alkaline Phosphatase: 56 U/L (ref 38–126)
Anion gap: 10 (ref 5–15)
BUN: 17 mg/dL (ref 8–23)
CO2: 25 mmol/L (ref 22–32)
Calcium: 8.3 mg/dL — ABNORMAL LOW (ref 8.9–10.3)
Chloride: 105 mmol/L (ref 98–111)
Creatinine, Ser: 1.18 mg/dL — ABNORMAL HIGH (ref 0.44–1.00)
GFR, Estimated: 43 mL/min — ABNORMAL LOW (ref >60)
Glucose, Bld: 99 mg/dL (ref 70–99)
Potassium: 4.2 mmol/L (ref 3.5–5.1)
Sodium: 140 mmol/L (ref 135–145)
Total Bilirubin: 0.7 mg/dL (ref 0.3–1.2)
Total Protein: 5.2 g/dL — ABNORMAL LOW (ref 6.5–8.1)

## 2020-08-13 LAB — HEMOGLOBIN AND HEMATOCRIT, BLOOD
HCT: 30.8 % — ABNORMAL LOW (ref 36.0–46.0)
HCT: 31.4 % — ABNORMAL LOW (ref 36.0–46.0)
HCT: 31.5 % — ABNORMAL LOW (ref 36.0–46.0)
Hemoglobin: 9.5 g/dL — ABNORMAL LOW (ref 12.0–15.0)
Hemoglobin: 9.7 g/dL — ABNORMAL LOW (ref 12.0–15.0)
Hemoglobin: 9.7 g/dL — ABNORMAL LOW (ref 12.0–15.0)

## 2020-08-13 LAB — GLUCOSE, CAPILLARY
Glucose-Capillary: 101 mg/dL — ABNORMAL HIGH (ref 70–99)
Glucose-Capillary: 88 mg/dL (ref 70–99)
Glucose-Capillary: 83 mg/dL (ref 70–99)
Glucose-Capillary: 127 mg/dL — ABNORMAL HIGH (ref 70–99)
Glucose-Capillary: 122 mg/dL — ABNORMAL HIGH (ref 70–99)
Glucose-Capillary: 91 mg/dL (ref 70–99)

## 2020-08-13 LAB — PHOSPHORUS: Phosphorus: 3.4 mg/dL (ref 2.5–4.6)

## 2020-08-13 LAB — MAGNESIUM: Magnesium: 2 mg/dL (ref 1.7–2.4)

## 2020-08-13 SURGERY — ESOPHAGOGASTRODUODENOSCOPY (EGD) WITH PROPOFOL
Anesthesia: Monitor Anesthesia Care

## 2020-08-13 MED ORDER — SODIUM CHLORIDE 0.9 % IV SOLN
510.0000 mg | Freq: Once | INTRAVENOUS | Status: AC
  Administered 2020-08-13: 510 mg via INTRAVENOUS
  Filled 2020-08-13: qty 510

## 2020-08-13 MED ORDER — PANTOPRAZOLE SODIUM 40 MG PO TBEC
40.0000 mg | DELAYED_RELEASE_TABLET | Freq: Two times a day (BID) | ORAL | Status: DC
  Administered 2020-08-14 – 2020-08-16 (×4): 40 mg via ORAL
  Filled 2020-08-13 (×5): qty 1

## 2020-08-13 MED ORDER — HYDRALAZINE HCL 10 MG PO TABS
10.0000 mg | ORAL_TABLET | Freq: Three times a day (TID) | ORAL | Status: DC
  Administered 2020-08-13 – 2020-08-14 (×2): 10 mg via ORAL
  Filled 2020-08-13 (×2): qty 1

## 2020-08-13 MED ORDER — PANTOPRAZOLE SODIUM 40 MG IV SOLR
40.0000 mg | Freq: Two times a day (BID) | INTRAVENOUS | Status: DC
  Administered 2020-08-13: 40 mg via INTRAVENOUS
  Filled 2020-08-13: qty 40

## 2020-08-13 MED ORDER — LIDOCAINE HCL (CARDIAC) PF 100 MG/5ML IV SOSY
PREFILLED_SYRINGE | INTRAVENOUS | Status: DC | PRN
  Administered 2020-08-13: 50 mg via INTRAVENOUS

## 2020-08-13 MED ORDER — PROPOFOL 500 MG/50ML IV EMUL
INTRAVENOUS | Status: DC | PRN
  Administered 2020-08-13: 125 ug/kg/min via INTRAVENOUS

## 2020-08-13 MED ORDER — SODIUM CHLORIDE 0.9 % IV SOLN: INTRAVENOUS | Status: DC

## 2020-08-13 MED ORDER — GLYCOPYRROLATE 0.2 MG/ML IJ SOLN
INTRAMUSCULAR | Status: DC | PRN
  Administered 2020-08-13: .2 mg via INTRAVENOUS

## 2020-08-13 MED ORDER — PROPOFOL 500 MG/50ML IV EMUL
INTRAVENOUS | Status: DC | PRN
  Administered 2020-08-13: 30 mg via INTRAVENOUS

## 2020-08-13 MED ORDER — LACTATED RINGERS IV SOLN
INTRAVENOUS | Status: AC | PRN
  Administered 2020-08-13: 20 mL/h via INTRAVENOUS

## 2020-08-13 SURGICAL SUPPLY — 15 items

## 2020-08-13 NOTE — Op Note (Addendum)
[facility] Patient Name: Maria [name] Procedure Date: 08/13/2020 MRN: [id number] Attending MD: Justice Britain , MD Date of Birth: [birth year] CSN: [id number] Age: 86 Admit Type: Inpatient Procedure:                Upper GI endoscopy Indications:              Diagnostic procedure, Acute post hemorrhagic                            anemia, Iron deficiency anemia, Melena, Occult                            blood in stool, Increased NSAID use Providers:                Justice Britain, MD, Elmer Ramp. Tilden Dome, RN,                            Tyrone Apple, Technician, Lesia Sago,                            Technician, Arnoldo Hooker, CRNA Referring MD:             Carol Ada, MD, Juanita Craver, MD, [facility] Medicines:                Monitored Anesthesia Care Complications:            No immediate complications. Estimated Blood Loss:     Estimated blood loss was minimal. Procedure:                Pre-Anesthesia Assessment:                           - Prior to the procedure, a History and Physical                            was performed, and patient medications and                            allergies were reviewed. The patient's tolerance of                            previous anesthesia was also reviewed. The risks                            and benefits of the procedure and the sedation                            options and risks were discussed with the patient.                            All questions were answered, and informed consent  was obtained. Prior Anticoagulants: The patient has                            taken no previous anticoagulant or antiplatelet                            agents except for NSAID medication. ASA Grade                            Assessment: III - A patient with severe systemic                            disease. After reviewing the risks and benefits,                             the patient was deemed in satisfactory condition to                            undergo the procedure.                           After obtaining informed consent, the endoscope was                            passed under direct vision. Throughout the                            procedure, the patient's blood pressure, pulse, and                            oxygen saturations were monitored continuously. The                            GIF-H190 ([id number]) Olympus gastroscope was                            introduced through the mouth, and advanced to the                            second part of duodenum. The upper GI endoscopy was                            accomplished without difficulty. The patient                            tolerated the procedure. Scope In: Scope Out: Findings:      No gross lesions were noted in the proximal esophagus and in the mid       esophagus.      LA Grade B (one or more mucosal breaks greater than 5 mm, not extending       between the tops of two mucosal folds) esophagitis with no bleeding was       found in the distal esophagus.      A widely patent and non-obstructing Schatzki ring was found at  the       gastroesophageal junction.      A 4 cm hiatal hernia was present.      Multiple dispersed small erosions with no bleeding and no stigmata of       recent bleeding were found in the hiatal hernia consistent with       Cameron's erosions.      Scattered severe inflammation with hemorrhage characterized by       significant erosions, erythema, friability and shallow ulcers were found       in the gastric body, at the incisura and in the gastric antrum. In one       area of the proximal body, there was evidence of oozing from an ulcer       that was lavaged and returned (no overt vessel noted). For hemostasis,       one hemostatic clip was successfully placed (MR conditional). There was       no bleeding at the end of the procedure.      No other gross  lesions were noted in the entire examined stomach.       Biopsies were taken with a cold forceps for histology and Helicobacter       pylori testing.      No gross lesions were noted in the duodenal bulb, in the first portion       of the duodenum and in the second portion of the duodenum. Impression:               - No gross lesions in esophagus proximally. LA                            Grade B esophagitis with no bleeding distally.                           - Widely patent and non-obstructing Schatzki ring.                           - 4 cm hiatal hernia with evidence of Cameron's                            erosions present (not active bleeding currently).                           - Erosive gastropathy and significant gastritis as                            well as scattered ulcers and erosions with one                            ulcer with active oozing was noted. Clip (MR                            conditional) was placed to the oozing ulcer with                            good effect.                           -  No other gross lesions in the stomach. Biopsied                            for HP evaluation.                           - No gross lesions in the duodenal bulb, in the                            first portion of the duodenum and in the second                            portion of the duodenum. Moderate Sedation:      Not Applicable - Patient had care per Anesthesia. Recommendation:           - The patient will be observed post-procedure,                            until all discharge criteria are met.                           - Return patient to hospital ward for ongoing care.                           - Patient's current presentation and clinical and                            endoscopic findings are consistent with likely                            NSAID related anemia of the Upper GI tract. Will be                            awaiting pathology results to confirm no H.  pylori.                            No need for Colonoscopy at this time.                           - IV PPI BID x24 hours and may transition tomorrow                            to PO PPI 40 mg twice daily and maintain for next                            3-4 months.                           - Await pathology results.                           - Observe patient's clinical course.                           -  Trend Hgb while in house daily.                           - Consider repeat EGD in 3-4 months to ensure                            healing of gastric ulcerations and consideration of                            downtitration of PPI at that time.                           - ADAT.                           - If evidence of transfusion dependent anemia                            occurs in this patient consider relook EGD.                           - Avoid chemical VTE PPx for at least 48 hours                            (SCDs and TED hose very reasonable and UOB).                           - May restart PO Iron within 1-week.                           - Would be reasonable to give IV Iron x1 dose while                            in house.                           - Inpatient GI [place] service will be available                            this weekend if necessary but otherwise if she                            remains in the hospital, Dr. Mann/Dr. Benson Norway will be                            able to coordinate any furtherworkup. Call with                            questions.                           - The findings and recommendations were discussed  with the patient.                           - The findings and recommendations were discussed                            with the designated responsible adult.                           - The findings and recommendations were discussed                            with the referring physician. Procedure Code(s):         --- Professional ---                           [phone], Esophagogastroduodenoscopy, flexible,                            transoral; with biopsy, single or multiple Diagnosis Code(s):        --- Professional ---                           K20.90, Esophagitis, unspecified without bleeding                           K22.2, Esophageal obstruction                           K44.9, Diaphragmatic hernia without obstruction or                            gangrene                           K31.89, Other diseases of stomach and duodenum                           K29.71, Gastritis, unspecified, with bleeding                           D62, Acute posthemorrhagic anemia                           D50.9, Iron deficiency anemia, unspecified                           K92.1, Melena (includes Hematochezia)                           R19.5, Other fecal abnormalities CPT copyright 2019 [employer]. All rights reserved. The codes documented in this report are preliminary and upon coder review may  be revised to meet current compliance requirements. Justice Britain, MD 08/13/2020 12:26:42 PM Number of Addenda: 0

## 2020-08-13 NOTE — Interval H&P Note (Signed)
History and Physical Interval Note:  08/13/2020 8:04 AM  Maria [name]  has presented today for surgery, with the diagnosis of gi bleed.  The various methods of treatment have been discussed with the patient and family. After consideration of risks, benefits and other options for treatment, the patient has consented to  Procedure(s): ESOPHAGOGASTRODUODENOSCOPY (EGD) WITH PROPOFOL (N/A) as a surgical intervention.  The patient's history has been reviewed, patient examined, no change in status, stable for surgery.  I have reviewed the patient's chart and labs.  Questions were answered to the patient's satisfaction.     [employer]

## 2020-08-13 NOTE — Anesthesia Postprocedure Evaluation (Signed)
Anesthesia Post Note  Patient: Maria [name]  Procedure(s) Performed: ESOPHAGOGASTRODUODENOSCOPY (EGD) WITH PROPOFOL (N/A ) BIOPSY HEMOSTASIS CLIP PLACEMENT     Patient location during evaluation: Endoscopy Anesthesia Type: MAC Level of consciousness: awake and alert Pain management: pain level controlled Vital Signs Assessment: post-procedure vital signs reviewed and stable Respiratory status: spontaneous breathing, nonlabored ventilation and respiratory function stable Cardiovascular status: blood pressure returned to baseline and stable Postop Assessment: no apparent nausea or vomiting Anesthetic complications: no   No complications documented.  Last Vitals:  Vitals:   08/13/20 1230 08/13/20 1235  BP: (!) 124/59   Pulse: 87 87  Resp: (!) 22 17  Temp:    SpO2: 100% 99%    Last Pain:  Vitals:   08/13/20 1228  TempSrc:   PainSc: 0-No pain                 Merlinda Frederick

## 2020-08-13 NOTE — Anesthesia Preprocedure Evaluation (Addendum)
Anesthesia Evaluation  Patient identified by MRN, date of birth, ID band Patient awake    Reviewed: Allergy & Precautions, NPO status , Patient's Chart, lab work & pertinent test results  Airway Mallampati: II  TM Distance: >3 FB Neck ROM: Full    Dental  (+) Partial Upper   Pulmonary sleep apnea and Continuous Positive Airway Pressure Ventilation ,    Pulmonary exam normal breath sounds clear to auscultation       Cardiovascular hypertension, + Peripheral Vascular Disease and +CHF  Normal cardiovascular exam Rhythm:Regular Rate:Normal  08/11/20 1. Hyperdynamic LV function; proximal septal thickening with systolic  anterior motion of MV; LVOT velocity of 2.5 m/s that increased to 3.5 m/s;  findings c/w HOCM.  2. Left ventricular ejection fraction, by estimation, is >75%. The left  ventricle has hyperdynamic function. The left ventricle has no regional  wall motion abnormalities. There is mild left ventricular hypertrophy.  Left ventricular diastolic parameters  are consistent with Grade I diastolic dysfunction (impaired relaxation).  3. Right ventricular systolic function is normal. The right ventricular  size is normal.  4. The mitral valve is normal in structure. No evidence of mitral valve  regurgitation. No evidence of mitral stenosis.  5. The aortic valve has an indeterminant number of cusps. Aortic valve  regurgitation is not visualized. No aortic stenosis is present.  6. The inferior vena cava is normal in size with greater than 50%  respiratory variability, suggesting right atrial pressure of 3 mmHg.    Neuro/Psych PSYCHIATRIC DISORDERS Anxiety Depression TIACVA    GI/Hepatic Gi bleed   Endo/Other  diabetes, Type 2, Insulin Dependent  Renal/GU Renal Insufficiency and CRFRenal disease     Musculoskeletal  (+) Arthritis ,   Abdominal Normal abdominal exam  (+)   Peds  Hematology   Anesthesia Other  Findings   Reproductive/Obstetrics                            Anesthesia Physical Anesthesia Plan  ASA: III and emergent  Anesthesia Plan: MAC   Post-op Pain Management:    Induction:   PONV Risk Score and Plan: 2 and Propofol infusion and Treatment may vary due to age or medical condition  Airway Management Planned: Nasal Cannula and Natural Airway  Additional Equipment: None  Intra-op Plan:   Post-operative Plan:   Informed Consent: I have reviewed the patients History and Physical, chart, labs and discussed the procedure including the risks, benefits and alternatives for the proposed anesthesia with the patient or authorized representative who has indicated his/her understanding and acceptance.       Plan Discussed with: CRNA and Anesthesiologist  Anesthesia Plan Comments:        Anesthesia Quick Evaluation

## 2020-08-13 NOTE — Transfer of Care (Signed)
Immediate Anesthesia Transfer of Care Note  Patient: Maria [name]  Procedure(s) Performed: Procedure(s): ESOPHAGOGASTRODUODENOSCOPY (EGD) WITH PROPOFOL (N/A) BIOPSY HEMOSTASIS CLIP PLACEMENT  Patient Location: PACU  Anesthesia Type:MAC  Level of Consciousness:  sedated, patient cooperative and responds to stimulation  Airway & Oxygen Therapy:Patient Spontanous Breathing and Patient connected to face mask oxgen  Post-op Assessment:  Report given to PACU RN and Post -op Vital signs reviewed and stable  Post vital signs:  Reviewed and stable  Last Vitals:  Vitals:   08/13/20 0535 08/13/20 1117  BP: (!) 172/78 (!) 172/78  Pulse: 76 84  Resp: 15 (!) 22  Temp:  37.3 C  SpO2: 56% 43%    Complications: No apparent anesthesia complications

## 2020-08-13 NOTE — Progress Notes (Signed)
PROGRESS NOTE    Maria [name]  EXH:[id number] DOB: Jan 28, 1926 DOA: 08/10/2020 PCP: Glendale Chard, MD   Brief Narrative:   Maria Marrone Sellersis a 86 y.o.femalewith medical history significant ofDM2, CKD3b, HTN, HFpEF. Presenting with syncopal episode. Her caregiver is at bedside and assisting with history. At 0710 hrs this morning, the patient became dizzy and nauseated. This lasted for about 5 minutes and then the patient passed out for a brief time of 5 - 10 seconds. She slowly started coming around over the course of the next several minutes. EMS was alerted and the patient was brought to the hospital. The caregiver reports that she has had these episodes intermittently over the last few months.  12/4: EGD shows NSAID related gastritis and ulcers. Will get IV PPI today and transition to PO PPI in the AM. IV iron today. Stool studies still pending. Add back hydralazine today. Still holding diuretics. Will assess for resuming in the AM.   Assessment & Plan:  Syncope     - likely secondary to a combination of anemia and overdiuresis     - Echocardiogram done and showed a hyperdynamic left ventricular function, grade 1 diastolic dysfunction and she had an EF of greater than 75%; appeared to show HOCM, cardiology was consulted     - Cardiology does not feel that she has true HOCM and Dr. Radford Pax believes that she has hyperdynamic function in the setting of dehydration     - Cardiology has reinitiated her beta-blocker, recommended repeat carotid Dopplers, TED hose, and recommended outpatient event monitor     - PT/OT eval limited d/t syncope; awaiting final recs     - GI consulted; EGD: NSAID related gastritis and gastric ulcers and Cameron's erosions; give IV iron, IV BID PPI x 24h then BID PO PPI starting 08/14/20 PM  Chronic Diastolic HF     - Per caregiver, she had an increase in her diuretic a couple of months ago      - metoprolol succinate 25 mg p.o. daily resumed     - telmisartan  80 mg p.o. daily as well as her isosorbide mononitrate 30 mg p.o. daily and hydralazine 25 mg p.o. 3 times daily has been held     - echocardiogram done and showed Hyperdynamic LV Fxn with an EF >75%, G1DD, and Findings consistent with HOCM     - cardiology recommends continue to hold diuretics for now as weight has been much lower than what it has been in the past year     - Strict I's and O's and daily weights     - BLE edema is chronic, needs TED hose     - 12/4: resume hydralazine today; look to add isosorbide mononitrate in the AM; continue to hold telmisartan  DM2     - Last A1c is 6.0 on 06/13/20      - currently holding her semaglutide 0.375 mL into the skin once a week     - SSI, DM diet, glucose checks  Gout     - resume allopurinol  Seronegative RA     - continue plaquenil  Depression and Anxiety     - continue citalopram  HTN     - continue metoprolol, resume hydralzine     - d/c amlodipine     - continue to hold isosorbide mononitrate, telmisartan, furosemide and metolazone  Iron Deficiency Anemia with concern for acute blood loss anemia     - Caregiver states she has been  having Dark hard stools      - s/p 1 unit pRBCs     - Hgb looks good this AM     - Holding her home aspirin for now     - GI onboard; EGD w/ NSAID related gastritis and gastric ulcers and Cameron's erosions; see syncope above  AKI on CKD3b/Stage 4      - She was CKD3b up until the summer; her gfr worsened in September and has continued to through today.      - continue to hold her diuretics today     - SCr improved today, appreciate cards assistance on diuresis  Diarrhea     - stool panel pending     - she is improving, follow  HLD     - continue atorvastatin, zetia  Hypomagnesemia Hypophosphatemia Hypokalemia     - replace, follow  DVT prophylaxis: SCDs Code Status: FULL Family Communication: None at bedside.   Status is: Inpatient  Remains inpatient appropriate  because:Inpatient level of care appropriate due to severity of illness   Dispo: The patient is from: Home              Anticipated d/c is to: Home              Anticipated d/c date is: 2 days              Patient currently is not medically stable to d/c.  Consultants:   GI  Cardiology  Procedures:   EGD  Antimicrobials:  . None   ROS:  Denies CP, dyspnea, N/V . Remainder ROS is negative for all not previously mentioned.  Subjective: "When can I eat?"  Objective: Vitals:   08/13/20 1228 08/13/20 1230 08/13/20 1235 08/13/20 1256  BP: (!) 112/56 (!) 124/59  135/75  Pulse: 81 87 87 86  Resp: 15 (!) 22 17 16   Temp:    (!) 97.5 F (36.4 C)  TempSrc:    Oral  SpO2: 100% 100% 99% 98%  Weight:      Height:        Intake/Output Summary (Last 24 hours) at 08/13/2020 1524 Last data filed at 08/12/2020 2231 Gross per 24 hour  Intake 0 ml  Output --  Net 0 ml   Filed Weights   08/11/20 0540 08/11/20 1932 08/12/20 0446  Weight: 67.8 kg 67.8 kg 66.8 kg    Examination:  General: 86 y.o. female resting in bed in NAD Eyes: PERRL, normal sclera ENMT: Nares patent w/o discharge, orophaynx clear, dentition normal, ears w/o discharge/lesions/ulcers Neck: Supple, trachea midline Cardiovascular: RRR, +S1, S2, no m/g/r, equal pulses throughout Respiratory: CTABL, no w/r/r, normal WOB GI: BS+, NDNT, no masses noted, no organomegaly noted MSK: No c/c; BLE edema Skin: No rashes, bruises, ulcerations noted Neuro: A&O x 3, no focal deficits Psyc: Appropriate interaction and affect, calm/cooperative   Data Reviewed: I have personally reviewed following labs and imaging studies.  CBC: Recent Labs  Lab 08/10/20 0841 08/10/20 1535 08/11/20 2243 08/12/20 0625 08/12/20 1427 08/12/20 2253 08/13/20 0703  WBC 6.7  --   --  5.2  --   --  6.4  NEUTROABS 5.3  --   --  3.4  --   --  4.4  HGB 8.2*   < > 8.9* 9.4* 9.3* 9.3* 9.4*  9.5*  HCT 27.2*   < > 29.1* 30.0* 29.3* 30.2* 30.6*   30.8*  MCV 93.8  --   --  90.1  --   --  90.8  PLT 219  --   --  169  --   --  193   < > = values in this interval not displayed.   Basic Metabolic Panel: Recent Labs  Lab 08/10/20 0841 08/11/20 0650 08/12/20 0625 08/13/20 0703  NA 140 142 139 140  K 3.5 3.3* 4.2 4.2  CL 103 109 107 105  CO2 25 25 24 25   GLUCOSE 160* 95 90 99  BUN 38* 29* 22 17  CREATININE 2.67* 1.72* 1.39* 1.18*  CALCIUM 8.4* 8.1* 8.5* 8.3*  MG 1.4* 1.7 1.7 2.0  PHOS  --  2.5 2.3* 3.4   GFR: Estimated Creatinine Clearance: 25.3 mL/min (A) (by C-G formula based on SCr of 1.18 mg/dL (H)). Liver Function Tests: Recent Labs  Lab 08/11/20 0650 08/12/20 0625 08/13/20 0703  AST 21 23 23   ALT 13 16 16   ALKPHOS 45 48 56  BILITOT 0.5 0.8 0.7  PROT 4.7* 5.2* 5.2*  ALBUMIN 2.6* 2.7* 2.5*   No results for input(s): LIPASE, AMYLASE in the last 168 hours. No results for input(s): AMMONIA in the last 168 hours. Coagulation Profile: No results for input(s): INR, PROTIME in the last 168 hours. Cardiac Enzymes: No results for input(s): CKTOTAL, CKMB, CKMBINDEX, TROPONINI in the last 168 hours. BNP (last 3 results) No results for input(s): PROBNP in the last 8760 hours. HbA1C: No results for input(s): HGBA1C in the last 72 hours. CBG: Recent Labs  Lab 08/12/20 1744 08/12/20 2222 08/13/20 0805 08/13/20 1147 08/13/20 1305  GLUCAP 176* 101* 101* 88 83   Lipid Profile: No results for input(s): CHOL, HDL, LDLCALC, TRIG, CHOLHDL, LDLDIRECT in the last 72 hours. Thyroid Function Tests: No results for input(s): TSH, T4TOTAL, FREET4, T3FREE, THYROIDAB in the last 72 hours. Anemia Panel: No results for input(s): VITAMINB12, FOLATE, FERRITIN, TIBC, IRON, RETICCTPCT in the last 72 hours. Sepsis Labs: No results for input(s): PROCALCITON, LATICACIDVEN in the last 168 hours.  Recent Results (from the past 240 hour(s))  Resp Panel by RT-PCR (Flu A&B, Covid) Nasopharyngeal Swab     Status: None   Collection Time:  08/10/20 10:22 AM   Specimen: Nasopharyngeal Swab; Nasopharyngeal(NP) swabs in vial transport medium  Result Value Ref Range Status   SARS Coronavirus 2 by RT PCR NEGATIVE NEGATIVE Final    Comment: (NOTE) SARS-CoV-2 target nucleic acids are NOT DETECTED.  The SARS-CoV-2 RNA is generally detectable in upper respiratory specimens during the acute phase of infection. The lowest concentration of SARS-CoV-2 viral copies this assay can detect is 138 copies/mL. A negative result does not preclude SARS-Cov-2 infection and should not be used as the sole basis for treatment or other patient management decisions. A negative result may occur with  improper specimen collection/handling, submission of specimen other than nasopharyngeal swab, presence of viral mutation(s) within the areas targeted by this assay, and inadequate number of viral copies(<138 copies/mL). A negative result must be combined with clinical observations, patient history, and epidemiological information. The expected result is Negative.  Fact Sheet for Patients:  EntrepreneurPulse.com.au  Fact Sheet for Healthcare Providers:  IncredibleEmployment.be  This test is no t yet approved or cleared by the [place] FDA and  has been authorized for detection and/or diagnosis of SARS-CoV-2 by FDA under an Emergency Use Authorization (EUA). This EUA will remain  in effect (meaning this test can be used) for the duration of the COVID-19 declaration under Section 564(b)(1) of the Act, 21 U.S.C.section 360bbb-3(b)(1), unless the authorization is terminated  or  revoked sooner.       Influenza A by PCR NEGATIVE NEGATIVE Final   Influenza B by PCR NEGATIVE NEGATIVE Final    Comment: (NOTE) The Xpert Xpress SARS-CoV-2/FLU/RSV plus assay is intended as an aid in the diagnosis of influenza from Nasopharyngeal swab specimens and should not be used as a sole basis for treatment. Nasal washings  and aspirates are unacceptable for Xpert Xpress SARS-CoV-2/FLU/RSV testing.  Fact Sheet for Patients: EntrepreneurPulse.com.au  Fact Sheet for Healthcare Providers: IncredibleEmployment.be  This test is not yet approved or cleared by the [place] FDA and has been authorized for detection and/or diagnosis of SARS-CoV-2 by FDA under an Emergency Use Authorization (EUA). This EUA will remain in effect (meaning this test can be used) for the duration of the COVID-19 declaration under Section 564(b)(1) of the Act, 21 U.S.C. section 360bbb-3(b)(1), unless the authorization is terminated or revoked.  Performed at [facility], [place] [address] [place], [place] [address]       Radiology Studies: No results found.   Scheduled Meds: . atorvastatin  40 mg Oral Q M,W,F  . citalopram  20 mg Oral Daily  . donepezil  10 mg Oral QHS  . ezetimibe  10 mg Oral QHS  . hydroxychloroquine  200 mg Oral Daily  . insulin aspart  0-9 Units Subcutaneous TID WC  . metoprolol succinate  25 mg Oral Daily  . [START ON 08/14/2020] pantoprazole  40 mg Oral BID AC  . pantoprazole (PROTONIX) IV  40 mg Intravenous Q12H  . potassium chloride  20 mEq Oral Daily  . sodium chloride flush  3 mL Intravenous Q12H   Continuous Infusions:   LOS: 0 days    Time spent: 35 minutes spent in the coordination of care today.    Jonnie Finner, DO [facility]  If 7PM-7AM, please contact night-coverage [email or web address] 08/13/2020, 3:24 PM

## 2020-08-14 DIAGNOSIS — K279 Peptic ulcer, site unspecified, unspecified as acute or chronic, without hemorrhage or perforation: Secondary | ICD-10-CM

## 2020-08-14 DIAGNOSIS — R55 Syncope and collapse: Secondary | ICD-10-CM | POA: Diagnosis not present

## 2020-08-14 DIAGNOSIS — D62 Acute posthemorrhagic anemia: Secondary | ICD-10-CM

## 2020-08-14 DIAGNOSIS — K921 Melena: Secondary | ICD-10-CM

## 2020-08-14 LAB — GLUCOSE, CAPILLARY
Glucose-Capillary: 79 mg/dL (ref 70–99)
Glucose-Capillary: 115 mg/dL — ABNORMAL HIGH (ref 70–99)
Glucose-Capillary: 120 mg/dL — ABNORMAL HIGH (ref 70–99)

## 2020-08-14 LAB — CBC WITH DIFFERENTIAL/PLATELET
Abs Immature Granulocytes: 0.02 10*3/uL (ref 0.00–0.07)
Basophils Absolute: 0 10*3/uL (ref 0.0–0.1)
Basophils Relative: 0 %
Eosinophils Absolute: 0.1 10*3/uL (ref 0.0–0.5)
Eosinophils Relative: 1 %
HCT: 31.2 % — ABNORMAL LOW (ref 36.0–46.0)
Hemoglobin: 9.7 g/dL — ABNORMAL LOW (ref 12.0–15.0)
Immature Granulocytes: 0 %
Lymphocytes Relative: 16 %
Lymphs Abs: 1 10*3/uL (ref 0.7–4.0)
MCH: 28.4 pg (ref 26.0–34.0)
MCHC: 31.1 g/dL (ref 30.0–36.0)
MCV: 91.2 fL (ref 80.0–100.0)
Monocytes Absolute: 0.5 10*3/uL (ref 0.1–1.0)
Monocytes Relative: 8 %
Neutro Abs: 4.6 10*3/uL (ref 1.7–7.7)
Neutrophils Relative %: 75 %
Platelets: 164 10*3/uL (ref 150–400)
RBC: 3.42 MIL/uL — ABNORMAL LOW (ref 3.87–5.11)
RDW: 17.9 % — ABNORMAL HIGH (ref 11.5–15.5)
WBC: 6.3 10*3/uL (ref 4.0–10.5)
nRBC: 0 % (ref 0.0–0.2)

## 2020-08-14 LAB — RENAL FUNCTION PANEL
Albumin: 2.5 g/dL — ABNORMAL LOW (ref 3.5–5.0)
Anion gap: 8 (ref 5–15)
BUN: 18 mg/dL (ref 8–23)
CO2: 23 mmol/L (ref 22–32)
Calcium: 8.5 mg/dL — ABNORMAL LOW (ref 8.9–10.3)
Chloride: 106 mmol/L (ref 98–111)
Creatinine, Ser: 1.12 mg/dL — ABNORMAL HIGH (ref 0.44–1.00)
GFR, Estimated: 46 mL/min — ABNORMAL LOW (ref >60)
Glucose, Bld: 85 mg/dL (ref 70–99)
Phosphorus: 3.3 mg/dL (ref 2.5–4.6)
Potassium: 4.2 mmol/L (ref 3.5–5.1)
Sodium: 137 mmol/L (ref 135–145)

## 2020-08-14 LAB — HEMOGLOBIN AND HEMATOCRIT, BLOOD
HCT: 31.9 % — ABNORMAL LOW (ref 36.0–46.0)
Hemoglobin: 9.9 g/dL — ABNORMAL LOW (ref 12.0–15.0)

## 2020-08-14 LAB — MAGNESIUM: Magnesium: 1.7 mg/dL (ref 1.7–2.4)

## 2020-08-14 MED ORDER — ISOSORBIDE MONONITRATE ER 30 MG PO TB24
30.0000 mg | ORAL_TABLET | Freq: Every day | ORAL | Status: DC
  Administered 2020-08-14 – 2020-08-15 (×2): 30 mg via ORAL
  Filled 2020-08-14 (×2): qty 1

## 2020-08-14 MED ORDER — DICLOFENAC SODIUM 1 % EX GEL
2.0000 g | Freq: Three times a day (TID) | CUTANEOUS | Status: DC | PRN
  Administered 2020-08-15 – 2020-08-16 (×3): 2 g via TOPICAL
  Filled 2020-08-14: qty 100

## 2020-08-14 MED ORDER — HYDRALAZINE HCL 25 MG PO TABS
25.0000 mg | ORAL_TABLET | Freq: Three times a day (TID) | ORAL | Status: DC
  Administered 2020-08-14 – 2020-08-15 (×4): 25 mg via ORAL
  Filled 2020-08-14 (×4): qty 1

## 2020-08-14 NOTE — Progress Notes (Signed)
Daily Rounding Note  08/14/2020, 8:28 AM  LOS: 1 day   SUBJECTIVE:   Chief complaint:  UGI bleed.  Erosive gastritis/gastric ulcers.  camerons erosions.  Blood loss anemia.   Sore throat last PM after EGD, some effect on swallowing but it is better today. denies sore mouth or lips.    HR, BP, oxygen sats all stable.  Tolerating HH diet.  Has not yet had breakfast Soft stool this AM, color not clear.  OBJECTIVE:         Vital signs in last 24 hours:    Temp:  [97.5 F (36.4 C)-99.9 F (37.7 C)] 98.8 F (37.1 C) (12/05 0554) Pulse Rate:  [80-87] 81 (12/05 0554) Resp:  [15-22] 16 (12/05 0554) BP: (94-172)/(46-78) 159/67 (12/05 0554) SpO2:  [96 %-100 %] 98 % (12/05 0554) Weight:  [69.3 kg] 69.3 kg (12/05 0554) Last BM Date: 08/14/20 Filed Weights   08/11/20 1932 08/12/20 0446 08/14/20 0554  Weight: 67.8 kg 66.8 kg 69.3 kg   General: pleasant, well apperaing, alert, comfortable ENT:  Lips swollen.  Plagues of raised tans islets on lower inner lips and several on tongue, not associated w bleeding or erythema and do not see aphthous or other types of ulcers.  Patches of hypoemic islets at posterior pharynx.   Heart: RRR Chest: no labored breathing Abdomen: soft, NT, active BS  Extremities: no CCE Derm: no sores or rashes on extremities, face or trunk.   Neuro/Psych:  Appropriate, fluuid speech, pleasant, calm.    Intake/Output from previous day: 12/04 0701 - 12/05 0700 In: 117 [IV Piggyback:117] Out: 300 [Urine:300]  Intake/Output this shift: No intake/output data recorded.  Lab Results: Recent Labs    08/12/20 0625 08/12/20 1427 08/13/20 0703 08/13/20 0703 08/13/20 1613 08/13/20 2330 08/14/20 0654  WBC 5.2  --  6.4  --   --   --  6.3  HGB 9.4*   < > 9.4*  9.5*   < > 9.7* 9.7* 9.7*  HCT 30.0*   < > 30.6*  30.8*   < > 31.5* 31.4* 31.2*  PLT 169  --  193  --   --   --  164   < > = values in this interval  not displayed.   BMET Recent Labs    08/12/20 0625 08/13/20 0703  NA 139 140  K 4.2 4.2  CL 107 105  CO2 24 25  GLUCOSE 90 99  BUN 22 17  CREATININE 1.39* 1.18*  CALCIUM 8.5* 8.3*   LFT Recent Labs    08/12/20 0625 08/13/20 0703  PROT 5.2* 5.2*  ALBUMIN 2.7* 2.5*  AST 23 23  ALT 16 16  ALKPHOS 48 56  BILITOT 0.8 0.7   PT/INR No results for input(s): LABPROT, INR in the last 72 hours. Hepatitis Panel No results for input(s): HEPBSAG, HCVAB, HEPAIGM, HEPBIGM in the last 72 hours.  Studies/Results: No results found.  ASSESMENT:    *    UGI bleed with melena and dizziness.   08/13/20 EGD.  Moderate esophagitis, 4 cm HH w Cameron's erosions (not bleeding).  Erosive gastritis/scattered gastric ulcers, 1 ulcer oozing and clipped.  Biopsies obtained, path pndg. Taking 81 ASA, aleve and no PPI or H2B at homw  *   Blood loss anemia a/w dizziness, nausea.  Hgb 6.9 >> 1 PRBC >> 9.7 and stable S/p Feraheme  12/4.  On po iron at home.    *  Painless labial and oral plaques, lip swelling.  ? Etilogy.  ? If related to her c/o :sore throat"  PLAN   *   Transition to Protonix 40 po bid for 3 to 4 months.  Avoid NSAIds Restart po iron in 1 week.   Restart 81 ASA (if deemed essential) in 1 month.   Follow up w Dr Collene Mares (or Dr Benson [place]) within next 2 to 3 weeks.  Follow up pathology and treat if H Pylori is positive.   GI signing off.       Maria [name]  08/14/2020, 8:28 AM Phone [phone]

## 2020-08-14 NOTE — Progress Notes (Addendum)
PROGRESS NOTE    Maria [name]  NTZ:[id number] DOB: 1926-07-01 DOA: 08/10/2020 PCP: Glendale Chard, MD   Brief Narrative:   Maria Heater Sellersis a 86 y.o.femalewith medical history significant ofDM2, CKD3b, HTN, HFpEF. Presenting with syncopal episode. Her caregiver is at bedside and assisting with history. At 0710 hrs this morning, the patient became dizzy and nauseated. This lasted for about 5 minutes and then the patient passed out for a brief time of 5 - 10 seconds. She slowly started coming around over the course of the next several minutes. EMS was alerted and the patient was brought to the hospital. The caregiver reports that she has had these episodes intermittently over the last few months.  Eval'd by cardiology and GI. Cards held diuretics and amlodipine. GI performed EGD. Showed NSAID related gastritis and ulcers.  12/5: BP creeping up; add back full hydralazine dose and imdur. Scr has leveled out. Can likely add back lower dose lasix in AM. Per GI rec, hold ASA for 2 weeks. Start PO iron in 1 week. Continue BID PPI for 4 months. Close to discharge.   Assessment & Plan: Syncope     - likely secondary to a combination of anemia and overdiuresis     - Echocardiogram done and showed a hyperdynamic left ventricular function, grade 1 diastolic dysfunction and she had an EF of greater than 75%; appeared to show HOCM, cardiology was consulted     - Cardiology does not feel that she has true HOCM and Dr. Radford Pax believes that she has hyperdynamic function in the setting of dehydration     - Cardiology has reinitiated her beta-blocker, recommended repeat carotid Dopplers, TED hose, and recommended outpatient event monitor     - PT/OT eval limited d/t syncope; awaiting final recs     - GI consulted; EGD: NSAID related gastritis and gastric ulcers and Cameron's erosions     - 12/5: Per GI: hold ASA for 2 weeks, continue BID PPI for 4 months, start PO iron in 1 week. Hgb is stable today.  Follow  Chronic Diastolic HF     - Per caregiver, she had an increase in her diuretic a couple of months ago      - metoprolol succinate 25 mg p.o. daily resumed     - telmisartan 80 mg p.o. daily as well as her isosorbide mononitrate 30 mg p.o. daily and hydralazine 25 mg p.o. 3 times daily has been held     - echocardiogram done and showed Hyperdynamic LV Fxn with an EF >75%, G1DD, and Findings consistent with HOCM     - cardiology recommends continue to hold diuretics for now as weight has been much lower than what it has been in the past year     - Strict I's and O's and daily weights     - BLE edema is chronic, needs TED hose     - 12/5: resume full dose hydralazine and imdur; Scr has leveled out. Can likely resume lower dose lasix in AM. Needs to wear TED hose.  DM2     - Last A1c is 6.0 on 06/13/20      - currently holding her semaglutide 0.375 mL into the skin once a week     - 08/14/20: SSI, glucose checks, DM diet  Gout     - resume allopurinol  Seronegative RA     - continue plaquenil  Depression and Anxiety     - continue citalopram  HTN     -  continue metoprolol hydralzine     - d/c amlodipine     - continue to hold furosemide and metolazone; can likely resume lower dose lasix in AM     - 12/5: resume imdur  Iron Deficiency Anemia with concern for acute blood loss anemia     - Caregiver states she has been having Dark hard stools      - s/p 1 unit pRBCs     - Hgb looks good this AM     - Holding her home aspirin for now     - GI onboard; EGD w/ NSAID related gastritis and gastric ulcers and Cameron's erosions; see syncope above     - s/p IV iron; start PO iron in 1 week  AKI on CKD3b     - She was CKD3b up until the summer; her gfr worsened in September     - continue to hold her diuretics     - 12/5: SCr is stable today  Diarrhea     - stool panel pending     - she is improving, follow  HLD     - continue atorvastatin,  zetia  Hypomagnesemia Hypophosphatemia Hypokalemia     - resolved, follow  DVT prophylaxis: SCDs Code Status: FULL Family Communication: None at bedside   Status is: Inpatient  Remains inpatient appropriate because:Inpatient level of care appropriate due to severity of illness   Dispo: The patient is from: Home              Anticipated d/c is to: Home              Anticipated d/c date is: 1 day              Patient currently is not medically stable to d/c.   Consultants:   GI  Cardiology  Procedures:  . EGD  ROS:  Reports sore throat. Denies CP, N, V, D, palpitations. Remainder ROS is negative for all not previously mentioned.  Subjective: "It has been sore since they did the procedure."  Objective: Vitals:   08/13/20 1617 08/13/20 1634 08/13/20 2116 08/14/20 0554  BP: 134/72 138/61 (!) 146/66 (!) 159/67  Pulse: 82 81 80 81  Resp: 15 16 16 16   Temp: 98.7 F (37.1 C) 98.8 F (37.1 C) 99.9 F (37.7 C) 98.8 F (37.1 C)  TempSrc: Oral Oral Oral Oral  SpO2: 97% 98% 96% 98%  Weight:    69.3 kg  Height:        Intake/Output Summary (Last 24 hours) at 08/14/2020 1002 Last data filed at 08/14/2020 7322 Gross per 24 hour  Intake 117 ml  Output 300 ml  Net -183 ml   Filed Weights   08/11/20 1932 08/12/20 0446 08/14/20 0554  Weight: 67.8 kg 66.8 kg 69.3 kg    Examination:  General: 86 y.o. female resting in bed in NAD Eyes: PERRL, normal sclera ENMT: Nares patent w/o discharge, orophaynx clear, dentition normal, ears w/o discharge/lesions/ulcers Neck: Supple, trachea midline Cardiovascular: RRR, +S1, S2, no m/g/r, equal pulses throughout Respiratory: CTABL, no w/r/r, normal WOB GI: BS+, NDNT, no masses noted, no organomegaly noted MSK: No e/c/c Skin: No rashes, bruises, ulcerations noted Neuro: A&O x 3, no focal deficits Psyc: Appropriate interaction and affect, calm/cooperative   Data Reviewed: I have personally reviewed following labs and imaging  studies.  CBC: Recent Labs  Lab 08/10/20 0841 08/10/20 1535 08/12/20 0254 08/12/20 1427 08/12/20 2253 08/13/20 0703 08/13/20 1613 08/13/20 2330 08/14/20 2706  WBC 6.7  --  5.2  --   --  6.4  --   --  6.3  NEUTROABS 5.3  --  3.4  --   --  4.4  --   --  4.6  HGB 8.2*   < > 9.4*   < > 9.3* 9.4*  9.5* 9.7* 9.7* 9.7*  HCT 27.2*   < > 30.0*   < > 30.2* 30.6*  30.8* 31.5* 31.4* 31.2*  MCV 93.8  --  90.1  --   --  90.8  --   --  91.2  PLT 219  --  169  --   --  193  --   --  164   < > = values in this interval not displayed.   Basic Metabolic Panel: Recent Labs  Lab 08/10/20 0841 08/11/20 0650 08/12/20 0625 08/13/20 0703 08/14/20 0654  NA 140 142 139 140 137  K 3.5 3.3* 4.2 4.2 4.2  CL 103 109 107 105 106  CO2 25 25 24 25 23   GLUCOSE 160* 95 90 99 85  BUN 38* 29* 22 17 18   CREATININE 2.67* 1.72* 1.39* 1.18* 1.12*  CALCIUM 8.4* 8.1* 8.5* 8.3* 8.5*  MG 1.4* 1.7 1.7 2.0 1.7  PHOS  --  2.5 2.3* 3.4 3.3   GFR: Estimated Creatinine Clearance: 27.1 mL/min (A) (by C-G formula based on SCr of 1.12 mg/dL (H)). Liver Function Tests: Recent Labs  Lab 08/11/20 0650 08/12/20 0625 08/13/20 0703 08/14/20 0654  AST 21 23 23   --   ALT 13 16 16   --   ALKPHOS 45 48 56  --   BILITOT 0.5 0.8 0.7  --   PROT 4.7* 5.2* 5.2*  --   ALBUMIN 2.6* 2.7* 2.5* 2.5*   No results for input(s): LIPASE, AMYLASE in the last 168 hours. No results for input(s): AMMONIA in the last 168 hours. Coagulation Profile: No results for input(s): INR, PROTIME in the last 168 hours. Cardiac Enzymes: No results for input(s): CKTOTAL, CKMB, CKMBINDEX, TROPONINI in the last 168 hours. BNP (last 3 results) No results for input(s): PROBNP in the last 8760 hours. HbA1C: No results for input(s): HGBA1C in the last 72 hours. CBG: Recent Labs  Lab 08/13/20 1616 08/13/20 1705 08/13/20 2113 08/14/20 0813 08/14/20 0906  GLUCAP 127* 122* 91 79 115*   Lipid Profile: No results for input(s): CHOL, HDL,  LDLCALC, TRIG, CHOLHDL, LDLDIRECT in the last 72 hours. Thyroid Function Tests: No results for input(s): TSH, T4TOTAL, FREET4, T3FREE, THYROIDAB in the last 72 hours. Anemia Panel: No results for input(s): VITAMINB12, FOLATE, FERRITIN, TIBC, IRON, RETICCTPCT in the last 72 hours. Sepsis Labs: No results for input(s): PROCALCITON, LATICACIDVEN in the last 168 hours.  Recent Results (from the past 240 hour(s))  Resp Panel by RT-PCR (Flu A&B, Covid) Nasopharyngeal Swab     Status: None   Collection Time: 08/10/20 10:22 AM   Specimen: Nasopharyngeal Swab; Nasopharyngeal(NP) swabs in vial transport medium  Result Value Ref Range Status   SARS Coronavirus 2 by RT PCR NEGATIVE NEGATIVE Final    Comment: (NOTE) SARS-CoV-2 target nucleic acids are NOT DETECTED.  The SARS-CoV-2 RNA is generally detectable in upper respiratory specimens during the acute phase of infection. The lowest concentration of SARS-CoV-2 viral copies this assay can detect is 138 copies/mL. A negative result does not preclude SARS-Cov-2 infection and should not be used as the sole basis for treatment or other patient management decisions. A negative result may occur with  improper specimen collection/handling, submission of specimen other than nasopharyngeal swab, presence of viral mutation(s) within the areas targeted by this assay, and inadequate number of viral copies(<138 copies/mL). A negative result must be combined with clinical observations, patient history, and epidemiological information. The expected result is Negative.  Fact Sheet for Patients:  EntrepreneurPulse.com.au  Fact Sheet for Healthcare Providers:  IncredibleEmployment.be  This test is no t yet approved or cleared by the [place] FDA and  has been authorized for detection and/or diagnosis of SARS-CoV-2 by FDA under an Emergency Use Authorization (EUA). This EUA will remain  in effect (meaning this test  can be used) for the duration of the COVID-19 declaration under Section 564(b)(1) of the Act, 21 U.S.C.section 360bbb-3(b)(1), unless the authorization is terminated  or revoked sooner.       Influenza A by PCR NEGATIVE NEGATIVE Final   Influenza B by PCR NEGATIVE NEGATIVE Final    Comment: (NOTE) The Xpert Xpress SARS-CoV-2/FLU/RSV plus assay is intended as an aid in the diagnosis of influenza from Nasopharyngeal swab specimens and should not be used as a sole basis for treatment. Nasal washings and aspirates are unacceptable for Xpert Xpress SARS-CoV-2/FLU/RSV testing.  Fact Sheet for Patients: EntrepreneurPulse.com.au  Fact Sheet for Healthcare Providers: IncredibleEmployment.be  This test is not yet approved or cleared by the [place] FDA and has been authorized for detection and/or diagnosis of SARS-CoV-2 by FDA under an Emergency Use Authorization (EUA). This EUA will remain in effect (meaning this test can be used) for the duration of the COVID-19 declaration under Section 564(b)(1) of the Act, 21 U.S.C. section 360bbb-3(b)(1), unless the authorization is terminated or revoked.  Performed at [facility], [place] [address] [place], [place] [address]   Gastrointestinal Panel by PCR , Stool     Status: None   Collection Time: 08/12/20  6:00 PM   Specimen: Stool  Result Value Ref Range Status   Campylobacter species NOT DETECTED NOT DETECTED Final   Plesimonas shigelloides NOT DETECTED NOT DETECTED Final   Salmonella species NOT DETECTED NOT DETECTED Final   Yersinia enterocolitica NOT DETECTED NOT DETECTED Final   Vibrio species NOT DETECTED NOT DETECTED Final   Vibrio cholerae NOT DETECTED NOT DETECTED Final   Enteroaggregative E coli (EAEC) NOT DETECTED NOT DETECTED Final   Enteropathogenic E coli (EPEC) NOT DETECTED NOT DETECTED Final   Enterotoxigenic E coli (ETEC) NOT DETECTED NOT DETECTED Final   Shiga  like toxin producing E coli (STEC) NOT DETECTED NOT DETECTED Final   Shigella/Enteroinvasive E coli (EIEC) NOT DETECTED NOT DETECTED Final   Cryptosporidium NOT DETECTED NOT DETECTED Final   Cyclospora cayetanensis NOT DETECTED NOT DETECTED Final   Entamoeba histolytica NOT DETECTED NOT DETECTED Final   Giardia lamblia NOT DETECTED NOT DETECTED Final   Adenovirus F40/41 NOT DETECTED NOT DETECTED Final   Astrovirus NOT DETECTED NOT DETECTED Final   Norovirus GI/GII NOT DETECTED NOT DETECTED Final   Rotavirus A NOT DETECTED NOT DETECTED Final   Sapovirus (I, II, IV, and V) NOT DETECTED NOT DETECTED Final    Comment: Performed at [facility], [address], [place], [place] [address]      Radiology Studies: No results found.   Scheduled Meds: . atorvastatin  40 mg Oral Q M,W,F  . citalopram  20 mg Oral Daily  . donepezil  10 mg Oral QHS  . ezetimibe  10 mg Oral QHS  . hydrALAZINE  25 mg Oral Q8H  . hydroxychloroquine  200 mg Oral Daily  . insulin aspart  0-9 Units Subcutaneous TID WC  . isosorbide mononitrate  30 mg Oral Daily  . metoprolol succinate  25 mg Oral Daily  . pantoprazole  40 mg Oral BID AC  . potassium chloride  20 mEq Oral Daily  . sodium chloride flush  3 mL Intravenous Q12H   Continuous Infusions:   LOS: 1 day    Time spent: 25 minutes spent in the coordination of care today.    Jonnie Finner, DO [facility]  If 7PM-7AM, please contact night-coverage [email or web address] 08/14/2020, 10:02 AM

## 2020-08-15 ENCOUNTER — Ambulatory Visit: Payer: Self-pay

## 2020-08-15 ENCOUNTER — Telehealth: Payer: Self-pay

## 2020-08-15 ENCOUNTER — Telehealth: Payer: Medicare Other

## 2020-08-15 ENCOUNTER — Inpatient Hospital Stay (HOSPITAL_COMMUNITY): Payer: Medicare Other

## 2020-08-15 DIAGNOSIS — R55 Syncope and collapse: Secondary | ICD-10-CM

## 2020-08-15 DIAGNOSIS — N183 Chronic kidney disease, stage 3 unspecified: Secondary | ICD-10-CM

## 2020-08-15 DIAGNOSIS — E1122 Type 2 diabetes mellitus with diabetic chronic kidney disease: Secondary | ICD-10-CM

## 2020-08-15 LAB — GLUCOSE, CAPILLARY
Glucose-Capillary: 113 mg/dL — ABNORMAL HIGH (ref 70–99)
Glucose-Capillary: 148 mg/dL — ABNORMAL HIGH (ref 70–99)
Glucose-Capillary: 222 mg/dL — ABNORMAL HIGH (ref 70–99)
Glucose-Capillary: 167 mg/dL — ABNORMAL HIGH (ref 70–99)

## 2020-08-15 LAB — CBC WITH DIFFERENTIAL/PLATELET
Abs Immature Granulocytes: 0.02 10*3/uL (ref 0.00–0.07)
Basophils Absolute: 0 10*3/uL (ref 0.0–0.1)
Basophils Relative: 0 %
Eosinophils Absolute: 0.1 10*3/uL (ref 0.0–0.5)
Eosinophils Relative: 1 %
HCT: 30.6 % — ABNORMAL LOW (ref 36.0–46.0)
Hemoglobin: 9.5 g/dL — ABNORMAL LOW (ref 12.0–15.0)
Immature Granulocytes: 0 %
Lymphocytes Relative: 14 %
Lymphs Abs: 1.2 10*3/uL (ref 0.7–4.0)
MCH: 28.3 pg (ref 26.0–34.0)
MCHC: 31 g/dL (ref 30.0–36.0)
MCV: 91.1 fL (ref 80.0–100.0)
Monocytes Absolute: 0.7 10*3/uL (ref 0.1–1.0)
Monocytes Relative: 8 %
Neutro Abs: 6.3 10*3/uL (ref 1.7–7.7)
Neutrophils Relative %: 77 %
Platelets: 142 10*3/uL — ABNORMAL LOW (ref 150–400)
RBC: 3.36 MIL/uL — ABNORMAL LOW (ref 3.87–5.11)
RDW: 17.9 % — ABNORMAL HIGH (ref 11.5–15.5)
WBC: 8.3 10*3/uL (ref 4.0–10.5)
nRBC: 0 % (ref 0.0–0.2)

## 2020-08-15 LAB — HEMOGLOBIN AND HEMATOCRIT, BLOOD
HCT: 31 % — ABNORMAL LOW (ref 36.0–46.0)
HCT: 28.2 % — ABNORMAL LOW (ref 36.0–46.0)
Hemoglobin: 9.6 g/dL — ABNORMAL LOW (ref 12.0–15.0)
Hemoglobin: 8.9 g/dL — ABNORMAL LOW (ref 12.0–15.0)

## 2020-08-15 LAB — MAGNESIUM: Magnesium: 1.7 mg/dL (ref 1.7–2.4)

## 2020-08-15 LAB — RENAL FUNCTION PANEL
Albumin: 2.4 g/dL — ABNORMAL LOW (ref 3.5–5.0)
Anion gap: 9 (ref 5–15)
BUN: 19 mg/dL (ref 8–23)
CO2: 25 mmol/L (ref 22–32)
Calcium: 8.4 mg/dL — ABNORMAL LOW (ref 8.9–10.3)
Chloride: 104 mmol/L (ref 98–111)
Creatinine, Ser: 1.07 mg/dL — ABNORMAL HIGH (ref 0.44–1.00)
GFR, Estimated: 49 mL/min — ABNORMAL LOW (ref >60)
Glucose, Bld: 105 mg/dL — ABNORMAL HIGH (ref 70–99)
Phosphorus: 2.9 mg/dL (ref 2.5–4.6)
Potassium: 4.4 mmol/L (ref 3.5–5.1)
Sodium: 138 mmol/L (ref 135–145)

## 2020-08-15 NOTE — TOC Initial Note (Signed)
Transition of Care [facility]) - Initial/Assessment Note    Patient Details  Name: Maria [name] MRN: [id number] Date of Birth: [birth year]  [facility] CM/SW Contact:    Lynnell Catalan, RN Phone Number: 08/15/2020, 3:23 PM  Clinical Narrative:                 Spoke with pt at bedside for dc planning. Pt has private duty aides 2 hours a day Monday-Thursday. Pt informed of Physical therapy recommendations for SNF vs HHPT. Pt insistent that she is going home and that she will have additional help from friends/family when she gets home. Pt has a 3in1 and RW at home already. Choice offered for HHPT/OT. Pt chooses [facility]. [facility] liaison contacted for referral. Will need MD orders for HHPT/OT. TOC will continue to follow.  Expected Discharge Plan: [place] Barriers to Discharge: Continued Medical Work up   Patient Goals and CMS Choice Patient states their goals for this hospitalization and ongoing recovery are:: To get home CMS Medicare.gov Compare Post Acute Care list provided to:: Patient Choice offered to / list presented to : Patient  Expected Discharge Plan and Services Expected Discharge Plan: [place]   Discharge Planning Services: CM Consult Post Acute Care Choice: [place] arrangements for the past 2 months: Apartment                           HH Arranged: PT, OT HH Agency: [place] ([facility]) Date HH Agency Contacted: 08/15/20 Time HH Agency Contacted: 51 Representative spoke with at [place]: [place] Arrangements/Services Living arrangements for the past 2 months: [place] with:: Self Patient language and need for interpreter reviewed:: Yes Do you feel safe going back to the place where you live?: Yes      Need for Family Participation in Patient Care: Yes (Comment) Care giver support system in place?: Yes (comment) Current home services: Homehealth aide Criminal Activity/Legal  Involvement Pertinent to Current Situation/Hospitalization: No - Comment as needed  Activities of Daily Living Home Assistive Devices/Equipment: Dentures (specify type), Eyeglasses, CBG Meter, Walker (specify type) (front wheeled and 4 wheeled walker) ADL Screening (condition at time of admission) Patient's cognitive ability adequate to safely complete daily activities?: Yes Is the patient deaf or have difficulty hearing?: No Does the patient have difficulty seeing, even when wearing glasses/contacts?: No Does the patient have difficulty concentrating, remembering, or making decisions?: Yes (sometimes) Patient able to express need for assistance with ADLs?: Yes Does the patient have difficulty dressing or bathing?: Yes Independently performs ADLs?: No Communication: Independent Dressing (OT): Needs assistance Is this a change from baseline?: Pre-admission baseline Grooming: Independent Feeding: Independent Bathing: Needs assistance Is this a change from baseline?: Pre-admission baseline Toileting: Independent with device (comment) In/Out Bed: Independent Walks in Home: Independent with device (comment) Does the patient have difficulty walking or climbing stairs?: Yes (secondary to weakness) Weakness of Legs: Both Weakness of Arms/Hands: None  Permission Sought/Granted Permission sought to share information with : Chartered certified accountant granted to share information with : Yes, Verbal Permission Granted     Permission granted to share info w AGENCY: [facility]        Emotional Assessment Appearance:: Appears stated age Attitude/Demeanor/Rapport: Gracious Affect (typically observed): Calm Orientation: : Oriented to Self, Oriented to Place, Oriented to  Time, Oriented to Situation      Admission diagnosis:  Syncope  and collapse [R55] Syncope [R55] Acute on chronic anemia [D64.9] Patient Active Problem List   Diagnosis Date Noted  . Syncope 08/10/2020  .  Seronegative rheumatoid arthritis ([place]) 06/02/2020  . Chronic renal disease, stage 3, moderately decreased glomerular filtration rate between 30-59 mL/min/1.73 square meter ([place]) 01/08/2020  . Hypertensive heart and kidney disease with chronic diastolic congestive heart failure and stage 3 chronic kidney disease ([place]) 01/06/2020  . Primary osteoarthritis of right knee 01/06/2020  . Joint pain 12/03/2019  . Hav (hallux abducto valgus), right 11/04/2019  . Diabetic peripheral vascular disease ([place]) 09/15/2019  . Gout 07/02/2019  . Insulin dependent type 2 diabetes mellitus ([place]) 07/02/2019  . Acute on chronic diastolic CHF (congestive heart failure) ([place]) 03/26/2018  . Acute on chronic renal insufficiency 03/26/2018  . Dyspnea on exertion 02/14/2018  . Palpitations 02/13/2017  . Chest pain 03/25/2015  . Acute diverticulitis 03/04/2013  . Bilateral lower extremity edema 01/23/2013  . Hyperlipidemia 01/23/2013  . Diverticulitis 01/31/2012  . Diabetes mellitus ([place]) 01/31/2012  . Essential hypertension 01/31/2012  . Obstructive sleep apnea on CPAP 01/31/2012  . History of TIAs 01/31/2012  . Obesity 01/31/2012  . H/O vertigo 01/31/2012   PCP:  Glendale Chard, MD Pharmacy:   [facility], [address] [place] [address] Phone: [phone] Fax: [phone]  [facility], [place] [address] [place] [address] Phone: [phone] Fax: [phone]     Social Determinants of Health (SDOH) Interventions    Readmission Risk Interventions No flowsheet data found.

## 2020-08-15 NOTE — Progress Notes (Signed)
PROGRESS NOTE    Maria [name]   XBM:[id number]  DOB: 08-28-1926  DOA: 08/10/2020     2  PCP: Glendale Chard, MD  CC: syncope  Hospital Course: Marletta E Sellersis a 86 y.o.femalewith medical history significant ofDM2, CKD3b, HTN, HFpEF. Patient presented after a syncopal episode at home with associated dizziness and nausea.  It was reported that she was unconscious for approximately 5 to 10 seconds before spontaneously regaining consciousness.  She was brought to the hospital via EMS for further evaluation.  Caregiver on admission had reported that patient was having intermittent episodes of similar over the last few months. On admission she was evaluated by GI and cardiology.  She was found to have worsening anemia and underwent EGD which revealed gastritis and erosions.  She was started on PPI and told to discontinue all NSAIDs at home (was taking Aleve) but okay for aspirin after holding for 2 weeks.  Etiology of her syncope was considered due to overdiuresis and multiple antihypertensive medications.  She was also hypotensive.  Medications were adjusted during hospitalization and blood pressure recovered.  Interval History:  No events overnight.  Lying in bed in no distress and wondering when she can go home.  Endorsed that likely she would be ready for discharging tomorrow.  Denies any chest pain, palpitations, shortness of breath.  Old records reviewed in assessment of this patient  ROS: Constitutional: negative for chills and fevers, Respiratory: negative for cough, Cardiovascular: negative for chest pain and Gastrointestinal: negative for abdominal pain  Assessment & Plan: Syncope - likely secondary to a combination of anemia and overdiuresis - Echocardiogram done and showed a hyperdynamic left ventricular function, grade 1 diastolic dysfunction and she had an EF of greater than 75%; appeared to show HOCM, cardiology was consulted - Cardiology does not feel  that she has true HOCM and Dr. Radford Pax believes that she has hyperdynamic function in the setting of dehydration - Cardiology has reinitiated her beta-blocker, recommended repeat carotid Dopplers, TED hose, and recommended outpatient event monitor - PT/OT eval limited d/t syncope; awaiting final recs - GI consulted; EGD: NSAID related gastritis and gastric ulcers and Cameron's erosions     - 12/5: Per GI: hold ASA for 2 weeks, continue BID PPI for 4 months, start PO iron in 1 week. Hgb is stable today. Follow -Will need further BP medication adjustment prior to discharge  Chronic Diastolic HF - Per caregiver, she had an increase in her diuretic a couple of months ago; she is also on multiple antihypertensive agents, and at her age 49 not need as much at this point -Hold amlodipine, Lasix, hydralazine, Imdur, telmisartan - continue Toprol - will resume in priority order: ARB, then lasix, then amlodipine (probable lower doses) - needs outpatient follow up with cardiology as well - echocardiogram done and showed Hyperdynamic LV Fxn with an EF >75%, G1DD, and Findings consistent with HOCM - Strict I's and O's and daily weights - BLE edema is chronic, needs TED hose  DM2 - Last A1c is 6.0 on 06/13/20  - currently holding her semaglutide 0.375 mL into the skin once a week - 08/14/20: SSI, glucose checks, DM diet  Gout - resume allopurinol  Seronegative RA - continue plaquenil  Depression and Anxiety - continue citalopram  HTN - continue Toprol - hold all others until BP recovers some - see CHF  Iron Deficiency Anemia with concern for acute blood loss anemia - Caregiver states she has been having Dark hard stools  -  s/p 1 unit pRBCs - Holding her home aspirin for now - GI onboard; EGD w/ NSAID related gastritis and gastric ulcers and Cameron's erosions; see syncope above     - s/p IV iron; start PO iron in 1  week  AKI on CKD3b - She was CKD3b up until the summer; her gfr worsened in September - continue to hold her diuretics  Diarrhea - improving - d/c precautions  HLD - continue atorvastatin, zetia  Hypomagnesemia Hypophosphatemia Hypokalemia - resolved, follow  Antimicrobials:   DVT prophylaxis: SCD Code Status: Full Family Communication: none present Disposition Plan: Status is: Inpatient  Remains inpatient appropriate because:IV treatments appropriate due to intensity of illness or inability to take PO and Inpatient level of care appropriate due to severity of illness   Dispo: The patient is from: Home              Anticipated d/c is to: Home              Anticipated d/c date is: 1 day              Patient currently is not medically stable to d/c.  Objective: Blood pressure (!) 122/52, pulse 77, temperature 99.5 F (37.5 C), temperature source Oral, resp. rate 16, height 4\' 11"  (1.499 m), weight 70.2 kg, SpO2 94 %.  Examination: General appearance: alert, cooperative and no distress Head: Normocephalic, without obvious abnormality, atraumatic Eyes: EOMI Lungs: clear to auscultation bilaterally Heart: regular rate and rhythm and S1, S2 normal Abdomen: normal findings: bowel sounds normal and soft, non-tender Extremities: chronic LE edema noted Skin: mobility and turgor normal Neurologic: Grossly normal  Consultants:   Cardiology  GI  Procedures:     Data Reviewed: I have personally reviewed following labs and imaging studies Results for orders placed or performed during the hospital encounter of 08/10/20 (from the past 24 hour(s))  Glucose, capillary     Status: Abnormal   Collection Time: 08/14/20  5:28 PM  Result Value Ref Range   Glucose-Capillary 120 (H) 70 - 99 mg/dL  CBC with Differential/Platelet     Status: Abnormal   Collection Time: 08/15/20  6:00 AM  Result Value Ref Range   WBC 8.3 4.0 - 10.5 K/uL   RBC 3.36 (L)  3.87 - 5.11 MIL/uL   Hemoglobin 9.5 (L) 12.0 - 15.0 g/dL   HCT 30.6 (L) 36 - 46 %   MCV 91.1 80.0 - 100.0 fL   MCH 28.3 26.0 - 34.0 pg   MCHC 31.0 30.0 - 36.0 g/dL   RDW 17.9 (H) 11.5 - 15.5 %   Platelets 142 (L) 150 - 400 K/uL   nRBC 0.0 0.0 - 0.2 %   Neutrophils Relative % 77 %   Neutro Abs 6.3 1.7 - 7.7 K/uL   Lymphocytes Relative 14 %   Lymphs Abs 1.2 0.7 - 4.0 K/uL   Monocytes Relative 8 %   Monocytes Absolute 0.7 0.1 - 1.0 K/uL   Eosinophils Relative 1 %   Eosinophils Absolute 0.1 0.0 - 0.5 K/uL   Basophils Relative 0 %   Basophils Absolute 0.0 0.0 - 0.1 K/uL   Immature Granulocytes 0 %   Abs Immature Granulocytes 0.02 0.00 - 0.07 K/uL  Magnesium     Status: None   Collection Time: 08/15/20  6:00 AM  Result Value Ref Range   Magnesium 1.7 1.7 - 2.4 mg/dL  Renal function panel     Status: Abnormal   Collection Time: 08/15/20  6:00 AM  Result Value Ref Range   Sodium 138 135 - 145 mmol/L   Potassium 4.4 3.5 - 5.1 mmol/L   Chloride 104 98 - 111 mmol/L   CO2 25 22 - 32 mmol/L   Glucose, Bld 105 (H) 70 - 99 mg/dL   BUN 19 8 - 23 mg/dL   Creatinine, Ser 1.07 (H) 0.44 - 1.00 mg/dL   Calcium 8.4 (L) 8.9 - 10.3 mg/dL   Phosphorus 2.9 2.5 - 4.6 mg/dL   Albumin 2.4 (L) 3.5 - 5.0 g/dL   GFR, Estimated 49 (L) >60 mL/min   Anion gap 9 5 - 15  Glucose, capillary     Status: Abnormal   Collection Time: 08/15/20  8:37 AM  Result Value Ref Range   Glucose-Capillary 113 (H) 70 - 99 mg/dL  Glucose, capillary     Status: Abnormal   Collection Time: 08/15/20 12:39 PM  Result Value Ref Range   Glucose-Capillary 148 (H) 70 - 99 mg/dL  Hemoglobin and hematocrit, blood     Status: Abnormal   Collection Time: 08/15/20 12:54 PM  Result Value Ref Range   Hemoglobin 9.6 (L) 12.0 - 15.0 g/dL   HCT 31.0 (L) 36 - 46 %    Recent Results (from the past 240 hour(s))  Resp Panel by RT-PCR (Flu A&B, Covid) Nasopharyngeal Swab     Status: None   Collection Time: 08/10/20 10:22 AM   Specimen:  Nasopharyngeal Swab; Nasopharyngeal(NP) swabs in vial transport medium  Result Value Ref Range Status   SARS Coronavirus 2 by RT PCR NEGATIVE NEGATIVE Final    Comment: (NOTE) SARS-CoV-2 target nucleic acids are NOT DETECTED.  The SARS-CoV-2 RNA is generally detectable in upper respiratory specimens during the acute phase of infection. The lowest concentration of SARS-CoV-2 viral copies this assay can detect is 138 copies/mL. A negative result does not preclude SARS-Cov-2 infection and should not be used as the sole basis for treatment or other patient management decisions. A negative result may occur with  improper specimen collection/handling, submission of specimen other than nasopharyngeal swab, presence of viral mutation(s) within the areas targeted by this assay, and inadequate number of viral copies(<138 copies/mL). A negative result must be combined with clinical observations, patient history, and epidemiological information. The expected result is Negative.  Fact Sheet for Patients:  EntrepreneurPulse.com.au  Fact Sheet for Healthcare Providers:  IncredibleEmployment.be  This test is no t yet approved or cleared by the [place] FDA and  has been authorized for detection and/or diagnosis of SARS-CoV-2 by FDA under an Emergency Use Authorization (EUA). This EUA will remain  in effect (meaning this test can be used) for the duration of the COVID-19 declaration under Section 564(b)(1) of the Act, 21 U.S.C.section 360bbb-3(b)(1), unless the authorization is terminated  or revoked sooner.       Influenza A by PCR NEGATIVE NEGATIVE Final   Influenza B by PCR NEGATIVE NEGATIVE Final    Comment: (NOTE) The Xpert Xpress SARS-CoV-2/FLU/RSV plus assay is intended as an aid in the diagnosis of influenza from Nasopharyngeal swab specimens and should not be used as a sole basis for treatment. Nasal washings and aspirates are unacceptable for  Xpert Xpress SARS-CoV-2/FLU/RSV testing.  Fact Sheet for Patients: EntrepreneurPulse.com.au  Fact Sheet for Healthcare Providers: IncredibleEmployment.be  This test is not yet approved or cleared by the [place] FDA and has been authorized for detection and/or diagnosis of SARS-CoV-2 by FDA under an Emergency Use Authorization (EUA). This EUA will remain  in effect (meaning this test can be used) for the duration of the COVID-19 declaration under Section 564(b)(1) of the Act, 21 U.S.C. section 360bbb-3(b)(1), unless the authorization is terminated or revoked.  Performed at [facility], [place] [address], [place], [place] [address]   Gastrointestinal Panel by PCR , Stool     Status: None   Collection Time: 08/12/20  6:00 PM   Specimen: Stool  Result Value Ref Range Status   Campylobacter species NOT DETECTED NOT DETECTED Final   Plesimonas shigelloides NOT DETECTED NOT DETECTED Final   Salmonella species NOT DETECTED NOT DETECTED Final   Yersinia enterocolitica NOT DETECTED NOT DETECTED Final   Vibrio species NOT DETECTED NOT DETECTED Final   Vibrio cholerae NOT DETECTED NOT DETECTED Final   Enteroaggregative E coli (EAEC) NOT DETECTED NOT DETECTED Final   Enteropathogenic E coli (EPEC) NOT DETECTED NOT DETECTED Final   Enterotoxigenic E coli (ETEC) NOT DETECTED NOT DETECTED Final   Shiga like toxin producing E coli (STEC) NOT DETECTED NOT DETECTED Final   Shigella/Enteroinvasive E coli (EIEC) NOT DETECTED NOT DETECTED Final   Cryptosporidium NOT DETECTED NOT DETECTED Final   Cyclospora cayetanensis NOT DETECTED NOT DETECTED Final   Entamoeba histolytica NOT DETECTED NOT DETECTED Final   Giardia lamblia NOT DETECTED NOT DETECTED Final   Adenovirus F40/41 NOT DETECTED NOT DETECTED Final   Astrovirus NOT DETECTED NOT DETECTED Final   Norovirus GI/GII NOT DETECTED NOT DETECTED Final   Rotavirus A NOT DETECTED NOT DETECTED  Final   Sapovirus (I, II, IV, and V) NOT DETECTED NOT DETECTED Final    Comment: Performed at [facility], [address], [place], [place] [address]     Radiology Studies: VAS US CAROTID  Result Date: 08/15/2020 Carotid Arterial Duplex Study Indications:       Syncope. Risk Factors:      Hypertension, hyperlipidemia, prior CVA. Comparison Study:  no prior Performing Technologist: Abram Sander RVS  Examination Guidelines: A complete evaluation includes B-mode imaging, spectral Doppler, color Doppler, and power Doppler as needed of all accessible portions of each vessel. Bilateral testing is considered an integral part of a complete examination. Limited examinations for reoccurring indications may be performed as noted.  Right Carotid Findings: +----------+--------+--------+--------+------------------+--------+           PSV cm/sEDV cm/sStenosisPlaque DescriptionComments +----------+--------+--------+--------+------------------+--------+ CCA Prox  65      13              heterogenous               +----------+--------+--------+--------+------------------+--------+ CCA Distal63      17              heterogenous               +----------+--------+--------+--------+------------------+--------+ ICA Prox  59      10      1-39%   heterogenous               +----------+--------+--------+--------+------------------+--------+ ICA Distal69      17                                         +----------+--------+--------+--------+------------------+--------+ ECA       82                                                 +----------+--------+--------+--------+------------------+--------+ +----------+--------+-------+--------+-------------------+  PSV cm/sEDV cmsDescribeArm Pressure (mmHG) +----------+--------+-------+--------+-------------------+ [id number]                                          +----------+--------+-------+--------+-------------------+ +---------+--------+--+--------+--+---------+ VertebralPSV cm/s63EDV cm/s14Antegrade +---------+--------+--+--------+--+---------+  Left Carotid Findings: +----------+--------+--------+--------+------------------+--------+           PSV cm/sEDV cm/sStenosisPlaque DescriptionComments +----------+--------+--------+--------+------------------+--------+ CCA Prox  99      19              heterogenous               +----------+--------+--------+--------+------------------+--------+ CCA Distal65      11              heterogenous               +----------+--------+--------+--------+------------------+--------+ ICA Prox  52      21      1-39%   heterogenous      tortuous +----------+--------+--------+--------+------------------+--------+ ICA Distal77      22                                         +----------+--------+--------+--------+------------------+--------+ ECA       91                                                 +----------+--------+--------+--------+------------------+--------+ +----------+--------+--------+--------+-------------------+           PSV cm/sEDV cm/sDescribeArm Pressure (mmHG) +----------+--------+--------+--------+-------------------+ [id number]                                          +----------+--------+--------+--------+-------------------+ +---------+--------+---+--------+--+---------+ VertebralPSV cm/s103EDV cm/s13Antegrade +---------+--------+---+--------+--+---------+   Summary: Right Carotid: Velocities in the right ICA are consistent with a 1-39% stenosis. Left Carotid: Velocities in the left ICA are consistent with a 1-39% stenosis. Vertebrals: Bilateral vertebral arteries demonstrate antegrade flow. *See table(s) above for measurements and observations.     Preliminary    VAS US CAROTID      US RENAL  Final Result    Portable Chest 1 View  Final  Result      Scheduled Meds: . atorvastatin  40 mg Oral Q M,W,F  . citalopram  20 mg Oral Daily  . donepezil  10 mg Oral QHS  . ezetimibe  10 mg Oral QHS  . hydrALAZINE  25 mg Oral Q8H  . hydroxychloroquine  200 mg Oral Daily  . insulin aspart  0-9 Units Subcutaneous TID WC  . metoprolol succinate  25 mg Oral Daily  . pantoprazole  40 mg Oral BID AC  . potassium chloride  20 mEq Oral Daily  . sodium chloride flush  3 mL Intravenous Q12H   PRN Meds: acetaminophen **OR** acetaminophen, diclofenac Sodium, promethazine Continuous Infusions:   LOS: 2 days  Time spent: Greater than 50% of the 35 minute visit was spent in counseling/coordination of care for the patient as laid out in the A&P.   Dwyane Dee, MD [facility] 08/15/2020, 4:28 PM

## 2020-08-15 NOTE — Progress Notes (Addendum)
Physical Therapy Treatment Patient Details Name: Maria [name] MRN: [id number] DOB: 01-25-1926 Today's Date: 08/15/2020    History of Present Illness Pt is 86 yo female with PMH including DM2, CKD, HF, HTN, and OSA.  Pt presented to the ED with syncopal symptoms.  Her hgb was 7.2 on 08/11/20 and she received 1 unit PRBC.  Cardiology has also been consulted.    PT Comments    General Comments: AxO x 2 but present with some cognitive deficits, decreased memory and word finding then she would mask with anger "don't you know what I am talking about?". Orthostatics with TEDS on: Supine        BP 132/58    HR 78 EOB            BP 134/72    HR 85 EOB 3 min  BP 126/64   HR  79 Standing      BP 122/64   HR 78  Pt required + 2 assist for all mobility.  General bed mobility comments: required increased physical assist this session wsp to support R LE due to knee pain and great difficulty self performing supine to sit upper body.  "I could move if I was in my bed". General transfer comment: Performed x 2 with mod A of 2 with difficulty achieving upright stance.  Hips still resting on bed.  Posterior lean.  "Oh my" stated pt.  "I could standif I had my rolling chair" I think she means her Rollator. Reguardless pt was unable to stand > 60 seconds tio achieve a BP.  Also present with tremors throughout and increased R knee pain.  "I could stand if I had my creme for my leg". General Gait Details: unable to attempt forward mobility as pt was unable to achieve full upright posture away from bed and unable to weight shift to her R LE.  Pt stated again, "I can walk but I need my rolling chair not this:.  Pt required + 2 side by side assist to stand.  Very unsteady.  HIGH FALL RISK. Pt does NOT have a full understanding of her mobility deficits. Pt lives home alone and will need SNF prior to safely returning.  Follow Up Recommendations  SNF     Equipment Recommendations  None recommended by PT     Recommendations for Other Services       Precautions / Restrictions Precautions Precautions: Fall Precaution Comments: syncope/apply TEDS Restrictions Weight Bearing Restrictions: No    Mobility  Bed Mobility Overal bed mobility: Needs Assistance Bed Mobility: Supine to Sit;Sit to Supine     Supine to sit: Mod assist Sit to supine: Max assist   General bed mobility comments: required increased physical assist this session wsp to support R LE due to knee pain and great difficulty self performing supine to sit upper body.  "I could move if I was in my bed".  Transfers Overall transfer level: Needs assistance Equipment used: Rolling walker (2 wheeled) Transfers: Sit to/from Stand Sit to Stand: Mod assist;+2 physical assistance;+2 safety/equipment         General transfer comment: Performed x 2 with mod A of 2 with difficulty achieving upright stance.  Hips still resting on bed.  Posterior lean.  "Oh my" stated pt.  "I could standif I had my rolling chair" I think she means her Rollator. Reguardless pt was unable to stand > 60 seconds tio achieve a BP.  Also present with tremors throughout and increased R knee  pain.  "I could stand if I had my creme for my leg".  Ambulation/Gait             General Gait Details: unable to attempt forward mobility as pt was unable to achieve full upright posture away from bed and unable to weight shift to her R LE.  Pt stated again, "I can walk but I need my rolling chair not this:.  Pt required + 2 side by side assist to stand.  Very unsteady.  HIGH FALL RISK. Pt does NOT have a full understanding of her mobility deficits.   Stairs             Wheelchair Mobility    Modified Rankin (Stroke Patients Only)       Balance                                            Cognition Arousal/Alertness: Awake/alert Behavior During Therapy: WFL for tasks assessed/performed Overall Cognitive Status: Within Functional  Limits for tasks assessed                                 General Comments: AxO x 2 but present with some cognitive deficits, decreased memory and word finding then she would mask with anger "don't you know what I am talking about?".      Exercises      General Comments        Pertinent Vitals/Pain Pain Assessment: Faces Faces Pain Scale: Hurts even more Pain Location: R knee - with weight bearing Pain Descriptors / Indicators: Grimacing Pain Intervention(s): Monitored during session;Repositioned    Home Living                      Prior Function            PT Goals (current goals can now be found in the care plan section) Progress towards PT goals: Progressing toward goals    Frequency    Min 3X/week      PT Plan Current plan remains appropriate    Co-evaluation              AM-PAC PT "6 Clicks" Mobility   Outcome Measure  Help needed turning from your back to your side while in a flat bed without using bedrails?: A Lot Help needed moving from lying on your back to sitting on the side of a flat bed without using bedrails?: A Lot Help needed moving to and from a bed to a chair (including a wheelchair)?: A Lot Help needed standing up from a chair using your arms (e.g., wheelchair or bedside chair)?: A Lot Help needed to walk in hospital room?: Total Help needed climbing 3-5 steps with a railing? : Total 6 Click Score: 10    End of Session Equipment Utilized During Treatment: Gait belt Activity Tolerance: Patient limited by pain;Patient limited by fatigue Patient left: in bed;with call bell/phone within reach;with bed alarm set Nurse Communication: Mobility status PT Visit Diagnosis: Other abnormalities of gait and mobility (R26.89);Muscle weakness (generalized) (M62.81);Dizziness and giddiness (R42)     Time: 1530-1600 PT Time Calculation (min) (ACUTE ONLY): 30 min  Charges:  $Therapeutic Activity: 23-37 mins  Rica Koyanagi  PTA Acute  Rehabilitation Services Pager      [phone] Office      [phone]

## 2020-08-15 NOTE — Chronic Care Management (AMB) (Signed)
  Chronic Care Management   Inpatient Admit Review Note  08/15/2020 Name: Maria [name] MRN: [id number] DOB: 18-Nov-1925  [name] is a 86 y.o. year old female who is a primary care patient of Glendale Chard, MD. [name] is actively engaged with the embedded care management team in the primary care practice and is being followed by RN Case Manager, Pharmacist, and BSW for assistance with disease management and care coordination needs related to HTN, DMII and CKD Stage III.   Hue E Heinke is currently admitted to the hospital for evaluation and treatment of Syncope and Collapse, AKI.  Plan: CM team will collaborate with [facility] and will follow patient post discharge.    Daneen Schick, BSW, CDP Social Worker, Certified Dementia Practitioner [place] [facility] [facility] [phone]

## 2020-08-15 NOTE — Telephone Encounter (Cosign Needed)
  Chronic Care Management   Outreach Note  08/15/2020 Name: Maria [name] MRN: [id number] DOB: 07/21/1926  Referred by: Glendale Chard, MD Reason for referral : Chronic Care Management (RNCM FU Call )   [name] is enrolled in a Managed Medicaid Health Plan: No  An unsuccessful telephone outreach was attempted today. The patient was referred to the case management team for assistance with care management and care coordination.   Follow Up Plan: Telephone follow up appointment with care management team member scheduled for: 09/01/20  Barb Merino, RN, BSN, CCM Care Management Coordinator [place] [facility]  Direct Phone: [phone]

## 2020-08-15 NOTE — Progress Notes (Signed)
Carotid duplex has been completed.   Preliminary results in CV Proc.   Abram Sander 08/15/2020 1:12 PM

## 2020-08-16 ENCOUNTER — Encounter (HOSPITAL_COMMUNITY): Payer: Self-pay | Admitting: Gastroenterology

## 2020-08-16 ENCOUNTER — Other Ambulatory Visit: Payer: Self-pay

## 2020-08-16 ENCOUNTER — Inpatient Hospital Stay (HOSPITAL_COMMUNITY)
Admission: EM | Admit: 2020-08-16 | Discharge: 2020-10-11 | DRG: 064 | Disposition: E | Payer: Medicare Other | Attending: Neurology | Admitting: Neurology

## 2020-08-16 ENCOUNTER — Encounter: Payer: Self-pay | Admitting: Gastroenterology

## 2020-08-16 ENCOUNTER — Other Ambulatory Visit: Payer: Self-pay | Admitting: Physician Assistant

## 2020-08-16 DIAGNOSIS — D638 Anemia in other chronic diseases classified elsewhere: Secondary | ICD-10-CM | POA: Diagnosis not present

## 2020-08-16 DIAGNOSIS — R29810 Facial weakness: Secondary | ICD-10-CM | POA: Diagnosis present

## 2020-08-16 DIAGNOSIS — N9089 Other specified noninflammatory disorders of vulva and perineum: Secondary | ICD-10-CM | POA: Diagnosis not present

## 2020-08-16 DIAGNOSIS — E042 Nontoxic multinodular goiter: Secondary | ICD-10-CM | POA: Diagnosis present

## 2020-08-16 DIAGNOSIS — B9620 Unspecified Escherichia coli [E. coli] as the cause of diseases classified elsewhere: Secondary | ICD-10-CM | POA: Diagnosis present

## 2020-08-16 DIAGNOSIS — I161 Hypertensive emergency: Secondary | ICD-10-CM | POA: Diagnosis not present

## 2020-08-16 DIAGNOSIS — R1310 Dysphagia, unspecified: Secondary | ICD-10-CM | POA: Diagnosis present

## 2020-08-16 DIAGNOSIS — N170 Acute kidney failure with tubular necrosis: Secondary | ICD-10-CM | POA: Diagnosis not present

## 2020-08-16 DIAGNOSIS — T508X5A Adverse effect of diagnostic agents, initial encounter: Secondary | ICD-10-CM | POA: Diagnosis present

## 2020-08-16 DIAGNOSIS — Z66 Do not resuscitate: Secondary | ICD-10-CM | POA: Diagnosis not present

## 2020-08-16 DIAGNOSIS — K2970 Gastritis, unspecified, without bleeding: Secondary | ICD-10-CM

## 2020-08-16 DIAGNOSIS — R2689 Other abnormalities of gait and mobility: Secondary | ICD-10-CM | POA: Diagnosis not present

## 2020-08-16 DIAGNOSIS — I129 Hypertensive chronic kidney disease with stage 1 through stage 4 chronic kidney disease, or unspecified chronic kidney disease: Secondary | ICD-10-CM | POA: Diagnosis not present

## 2020-08-16 DIAGNOSIS — Z90711 Acquired absence of uterus with remaining cervical stump: Secondary | ICD-10-CM

## 2020-08-16 DIAGNOSIS — N179 Acute kidney failure, unspecified: Secondary | ICD-10-CM

## 2020-08-16 DIAGNOSIS — R509 Fever, unspecified: Secondary | ICD-10-CM

## 2020-08-16 DIAGNOSIS — J189 Pneumonia, unspecified organism: Secondary | ICD-10-CM | POA: Diagnosis not present

## 2020-08-16 DIAGNOSIS — I639 Cerebral infarction, unspecified: Secondary | ICD-10-CM | POA: Diagnosis not present

## 2020-08-16 DIAGNOSIS — E870 Hyperosmolality and hypernatremia: Secondary | ICD-10-CM | POA: Diagnosis not present

## 2020-08-16 DIAGNOSIS — Z20822 Contact with and (suspected) exposure to covid-19: Secondary | ICD-10-CM | POA: Diagnosis present

## 2020-08-16 DIAGNOSIS — N1832 Acute kidney failure, unspecified: Secondary | ICD-10-CM

## 2020-08-16 DIAGNOSIS — R531 Weakness: Secondary | ICD-10-CM | POA: Diagnosis not present

## 2020-08-16 DIAGNOSIS — E1110 Type 2 diabetes mellitus with ketoacidosis without coma: Secondary | ICD-10-CM | POA: Diagnosis not present

## 2020-08-16 DIAGNOSIS — I2510 Atherosclerotic heart disease of native coronary artery without angina pectoris: Secondary | ICD-10-CM | POA: Diagnosis present

## 2020-08-16 DIAGNOSIS — E1122 Type 2 diabetes mellitus with diabetic chronic kidney disease: Secondary | ICD-10-CM | POA: Diagnosis present

## 2020-08-16 DIAGNOSIS — R4701 Aphasia: Secondary | ICD-10-CM | POA: Diagnosis present

## 2020-08-16 DIAGNOSIS — M25561 Pain in right knee: Secondary | ICD-10-CM | POA: Diagnosis not present

## 2020-08-16 DIAGNOSIS — Z4659 Encounter for fitting and adjustment of other gastrointestinal appliance and device: Secondary | ICD-10-CM | POA: Diagnosis not present

## 2020-08-16 DIAGNOSIS — E872 Acidosis: Secondary | ICD-10-CM | POA: Diagnosis not present

## 2020-08-16 DIAGNOSIS — Z7982 Long term (current) use of aspirin: Secondary | ICD-10-CM

## 2020-08-16 DIAGNOSIS — Z79899 Other long term (current) drug therapy: Secondary | ICD-10-CM

## 2020-08-16 DIAGNOSIS — R55 Syncope and collapse: Secondary | ICD-10-CM

## 2020-08-16 DIAGNOSIS — N390 Urinary tract infection, site not specified: Secondary | ICD-10-CM | POA: Diagnosis not present

## 2020-08-16 DIAGNOSIS — I6602 Occlusion and stenosis of left middle cerebral artery: Secondary | ICD-10-CM | POA: Diagnosis not present

## 2020-08-16 DIAGNOSIS — I5032 Chronic diastolic (congestive) heart failure: Secondary | ICD-10-CM | POA: Diagnosis present

## 2020-08-16 DIAGNOSIS — S301XXA Contusion of abdominal wall, initial encounter: Secondary | ICD-10-CM | POA: Diagnosis not present

## 2020-08-16 DIAGNOSIS — D509 Iron deficiency anemia, unspecified: Secondary | ICD-10-CM

## 2020-08-16 DIAGNOSIS — X58XXXA Exposure to other specified factors, initial encounter: Secondary | ICD-10-CM | POA: Diagnosis not present

## 2020-08-16 DIAGNOSIS — I130 Hypertensive heart and chronic kidney disease with heart failure and stage 1 through stage 4 chronic kidney disease, or unspecified chronic kidney disease: Secondary | ICD-10-CM | POA: Diagnosis present

## 2020-08-16 DIAGNOSIS — L89152 Pressure ulcer of sacral region, stage 2: Secondary | ICD-10-CM | POA: Diagnosis present

## 2020-08-16 DIAGNOSIS — I421 Obstructive hypertrophic cardiomyopathy: Secondary | ICD-10-CM | POA: Diagnosis present

## 2020-08-16 DIAGNOSIS — K922 Gastrointestinal hemorrhage, unspecified: Secondary | ICD-10-CM | POA: Diagnosis not present

## 2020-08-16 DIAGNOSIS — Z515 Encounter for palliative care: Secondary | ICD-10-CM | POA: Diagnosis not present

## 2020-08-16 DIAGNOSIS — E7841 Elevated Lipoprotein(a): Secondary | ICD-10-CM | POA: Diagnosis not present

## 2020-08-16 DIAGNOSIS — Z794 Long term (current) use of insulin: Secondary | ICD-10-CM | POA: Diagnosis not present

## 2020-08-16 DIAGNOSIS — I160 Hypertensive urgency: Secondary | ICD-10-CM | POA: Diagnosis not present

## 2020-08-16 DIAGNOSIS — R569 Unspecified convulsions: Secondary | ICD-10-CM | POA: Diagnosis not present

## 2020-08-16 DIAGNOSIS — S0003XA Contusion of scalp, initial encounter: Secondary | ICD-10-CM | POA: Diagnosis present

## 2020-08-16 DIAGNOSIS — Z8719 Personal history of other diseases of the digestive system: Secondary | ICD-10-CM | POA: Diagnosis not present

## 2020-08-16 DIAGNOSIS — D6489 Other specified anemias: Secondary | ICD-10-CM | POA: Diagnosis present

## 2020-08-16 DIAGNOSIS — R4702 Dysphasia: Secondary | ICD-10-CM | POA: Diagnosis present

## 2020-08-16 DIAGNOSIS — F0390 Unspecified dementia without behavioral disturbance: Secondary | ICD-10-CM | POA: Diagnosis present

## 2020-08-16 DIAGNOSIS — D62 Acute posthemorrhagic anemia: Secondary | ICD-10-CM | POA: Diagnosis not present

## 2020-08-16 DIAGNOSIS — D500 Iron deficiency anemia secondary to blood loss (chronic): Secondary | ICD-10-CM | POA: Diagnosis not present

## 2020-08-16 DIAGNOSIS — N183 Chronic kidney disease, stage 3 unspecified: Secondary | ICD-10-CM | POA: Diagnosis not present

## 2020-08-16 DIAGNOSIS — I63412 Cerebral infarction due to embolism of left middle cerebral artery: Principal | ICD-10-CM | POA: Diagnosis present

## 2020-08-16 DIAGNOSIS — R5381 Other malaise: Secondary | ICD-10-CM | POA: Diagnosis not present

## 2020-08-16 DIAGNOSIS — L8990 Pressure ulcer of unspecified site, unspecified stage: Secondary | ICD-10-CM | POA: Insufficient documentation

## 2020-08-16 DIAGNOSIS — Z9889 Other specified postprocedural states: Secondary | ICD-10-CM | POA: Diagnosis not present

## 2020-08-16 DIAGNOSIS — G9341 Metabolic encephalopathy: Secondary | ICD-10-CM | POA: Diagnosis not present

## 2020-08-16 DIAGNOSIS — E785 Hyperlipidemia, unspecified: Secondary | ICD-10-CM | POA: Diagnosis present

## 2020-08-16 DIAGNOSIS — D72828 Other elevated white blood cell count: Secondary | ICD-10-CM | POA: Diagnosis present

## 2020-08-16 DIAGNOSIS — J9811 Atelectasis: Secondary | ICD-10-CM | POA: Diagnosis not present

## 2020-08-16 DIAGNOSIS — N3000 Acute cystitis without hematuria: Secondary | ICD-10-CM | POA: Diagnosis not present

## 2020-08-16 DIAGNOSIS — G319 Degenerative disease of nervous system, unspecified: Secondary | ICD-10-CM | POA: Diagnosis not present

## 2020-08-16 DIAGNOSIS — I63512 Cerebral infarction due to unspecified occlusion or stenosis of left middle cerebral artery: Secondary | ICD-10-CM | POA: Diagnosis not present

## 2020-08-16 DIAGNOSIS — I10 Essential (primary) hypertension: Secondary | ICD-10-CM | POA: Diagnosis not present

## 2020-08-16 DIAGNOSIS — Z7189 Other specified counseling: Secondary | ICD-10-CM | POA: Diagnosis not present

## 2020-08-16 DIAGNOSIS — B370 Candidal stomatitis: Secondary | ICD-10-CM | POA: Diagnosis present

## 2020-08-16 DIAGNOSIS — I6782 Cerebral ischemia: Secondary | ICD-10-CM | POA: Diagnosis not present

## 2020-08-16 DIAGNOSIS — I616 Nontraumatic intracerebral hemorrhage, multiple localized: Secondary | ICD-10-CM | POA: Diagnosis not present

## 2020-08-16 DIAGNOSIS — E1151 Type 2 diabetes mellitus with diabetic peripheral angiopathy without gangrene: Secondary | ICD-10-CM | POA: Diagnosis present

## 2020-08-16 DIAGNOSIS — E1165 Type 2 diabetes mellitus with hyperglycemia: Secondary | ICD-10-CM | POA: Diagnosis present

## 2020-08-16 DIAGNOSIS — R1312 Dysphagia, oropharyngeal phase: Secondary | ICD-10-CM | POA: Diagnosis not present

## 2020-08-16 DIAGNOSIS — Z4682 Encounter for fitting and adjustment of non-vascular catheter: Secondary | ICD-10-CM | POA: Diagnosis not present

## 2020-08-16 DIAGNOSIS — G8191 Hemiplegia, unspecified affecting right dominant side: Secondary | ICD-10-CM | POA: Diagnosis present

## 2020-08-16 DIAGNOSIS — G4733 Obstructive sleep apnea (adult) (pediatric): Secondary | ICD-10-CM | POA: Diagnosis not present

## 2020-08-16 DIAGNOSIS — M109 Gout, unspecified: Secondary | ICD-10-CM | POA: Diagnosis present

## 2020-08-16 DIAGNOSIS — N141 Nephropathy induced by other drugs, medicaments and biological substances: Secondary | ICD-10-CM | POA: Diagnosis present

## 2020-08-16 DIAGNOSIS — E860 Dehydration: Secondary | ICD-10-CM | POA: Diagnosis present

## 2020-08-16 DIAGNOSIS — Z7689 Persons encountering health services in other specified circumstances: Secondary | ICD-10-CM

## 2020-08-16 DIAGNOSIS — R29716 NIHSS score 16: Secondary | ICD-10-CM | POA: Diagnosis not present

## 2020-08-16 DIAGNOSIS — Z9989 Dependence on other enabling machines and devices: Secondary | ICD-10-CM | POA: Diagnosis not present

## 2020-08-16 LAB — HEMOGLOBIN AND HEMATOCRIT, BLOOD
HCT: 30.4 % — ABNORMAL LOW (ref 36.0–46.0)
Hemoglobin: 9.2 g/dL — ABNORMAL LOW (ref 12.0–15.0)

## 2020-08-16 LAB — BASIC METABOLIC PANEL
Anion gap: 10 (ref 5–15)
BUN: 26 mg/dL — ABNORMAL HIGH (ref 8–23)
CO2: 24 mmol/L (ref 22–32)
Calcium: 9.1 mg/dL (ref 8.9–10.3)
Chloride: 104 mmol/L (ref 98–111)
Creatinine, Ser: 1.65 mg/dL — ABNORMAL HIGH (ref 0.44–1.00)
GFR, Estimated: 29 mL/min — ABNORMAL LOW (ref >60)
Glucose, Bld: 155 mg/dL — ABNORMAL HIGH (ref 70–99)
Potassium: 5.4 mmol/L — ABNORMAL HIGH (ref 3.5–5.1)
Sodium: 138 mmol/L (ref 135–145)

## 2020-08-16 LAB — CBG MONITORING, ED: Glucose-Capillary: 146 mg/dL — ABNORMAL HIGH (ref 70–99)

## 2020-08-16 LAB — GLUCOSE, CAPILLARY: Glucose-Capillary: 135 mg/dL — ABNORMAL HIGH (ref 70–99)

## 2020-08-16 LAB — SURGICAL PATHOLOGY

## 2020-08-16 MED ORDER — ASPIRIN EC 81 MG PO TBEC: 81.0000 mg | DELAYED_RELEASE_TABLET | Freq: Every day | ORAL | Status: DC

## 2020-08-16 MED ORDER — TELMISARTAN 20 MG PO TABS: 20.0000 mg | ORAL_TABLET | Freq: Every day | ORAL | 3 refills | Status: AC

## 2020-08-16 MED ORDER — FUROSEMIDE 40 MG PO TABS
40.0000 mg | ORAL_TABLET | Freq: Every day | ORAL | Status: DC
  Administered 2020-08-17 – 2020-08-18 (×2): 40 mg via ORAL
  Filled 2020-08-16 (×2): qty 1

## 2020-08-16 MED ORDER — FA-PYRIDOXINE-CYANOCOBALAMIN 2.5-25-2 MG PO TABS
1.0000 | ORAL_TABLET | Freq: Every day | ORAL | Status: DC
  Administered 2020-08-17 – 2020-08-18 (×2): 1 via ORAL
  Filled 2020-08-16 (×4): qty 1

## 2020-08-16 MED ORDER — ATORVASTATIN CALCIUM 40 MG PO TABS: 40.0000 mg | ORAL_TABLET | Freq: Every day | ORAL | Status: AC

## 2020-08-16 MED ORDER — ACETAMINOPHEN 500 MG PO TABS: 500.0000 mg | ORAL_TABLET | ORAL | Status: AC | PRN

## 2020-08-16 MED ORDER — HYDROXYCHLOROQUINE SULFATE 200 MG PO TABS
200.0000 mg | ORAL_TABLET | Freq: Every day | ORAL | Status: DC
  Administered 2020-08-17 – 2020-08-18 (×2): 200 mg via ORAL
  Filled 2020-08-16 (×3): qty 1

## 2020-08-16 MED ORDER — IRBESARTAN 150 MG PO TABS
75.0000 mg | ORAL_TABLET | Freq: Every day | ORAL | Status: DC
  Administered 2020-08-17 – 2020-08-18 (×2): 75 mg via ORAL
  Filled 2020-08-16 (×2): qty 1

## 2020-08-16 MED ORDER — FUROSEMIDE 40 MG PO TABS: 40.0000 mg | ORAL_TABLET | Freq: Every day | ORAL | 3 refills | Status: AC

## 2020-08-16 MED ORDER — INSULIN ASPART 100 UNIT/ML ~~LOC~~ SOLN
0.0000 [IU] | Freq: Every day | SUBCUTANEOUS | Status: DC
  Filled 2020-08-16: qty 0.05

## 2020-08-16 MED ORDER — EZETIMIBE 10 MG PO TABS
10.0000 mg | ORAL_TABLET | Freq: Every day | ORAL | Status: DC
  Filled 2020-08-16 (×2): qty 1

## 2020-08-16 MED ORDER — PANTOPRAZOLE SODIUM 40 MG PO TBEC: 40.0000 mg | DELAYED_RELEASE_TABLET | Freq: Two times a day (BID) | ORAL | 3 refills | Status: AC

## 2020-08-16 MED ORDER — INSULIN ASPART 100 UNIT/ML ~~LOC~~ SOLN
0.0000 [IU] | Freq: Three times a day (TID) | SUBCUTANEOUS | Status: DC
  Filled 2020-08-16: qty 0.15

## 2020-08-16 MED ORDER — METOPROLOL SUCCINATE ER 25 MG PO TB24
25.0000 mg | ORAL_TABLET | Freq: Every day | ORAL | Status: DC
  Administered 2020-08-17 – 2020-08-18 (×2): 25 mg via ORAL
  Filled 2020-08-16 (×2): qty 1

## 2020-08-16 MED ORDER — FERROUS SULFATE 325 (65 FE) MG PO TABS
325.0000 mg | ORAL_TABLET | Freq: Every day | ORAL | Status: DC
  Administered 2020-08-17 – 2020-08-18 (×2): 325 mg via ORAL
  Filled 2020-08-16 (×2): qty 1

## 2020-08-16 MED ORDER — ALLOPURINOL 100 MG PO TABS
200.0000 mg | ORAL_TABLET | Freq: Every day | ORAL | Status: DC
  Administered 2020-08-18: 200 mg via ORAL
  Filled 2020-08-16 (×3): qty 2

## 2020-08-16 MED ORDER — ASPIRIN EC 81 MG PO TBEC: 81.0000 mg | DELAYED_RELEASE_TABLET | Freq: Every day | ORAL | 11 refills | Status: AC

## 2020-08-16 MED ORDER — ATORVASTATIN CALCIUM 40 MG PO TABS
40.0000 mg | ORAL_TABLET | Freq: Every day | ORAL | Status: DC
  Administered 2020-08-17 – 2020-08-18 (×2): 40 mg via ORAL
  Filled 2020-08-16 (×2): qty 1

## 2020-08-16 MED ORDER — ACETAMINOPHEN 500 MG PO TABS
500.0000 mg | ORAL_TABLET | ORAL | Status: DC | PRN
  Administered 2020-08-17: 500 mg via ORAL
  Filled 2020-08-16: qty 1

## 2020-08-16 MED ORDER — DONEPEZIL HCL 10 MG PO TABS
10.0000 mg | ORAL_TABLET | Freq: Every evening | ORAL | Status: DC
  Administered 2020-08-17 (×2): 10 mg via ORAL
  Filled 2020-08-16 (×2): qty 2
  Filled 2020-08-16: qty 1

## 2020-08-16 MED ORDER — IRBESARTAN 75 MG PO TABS
75.0000 mg | ORAL_TABLET | Freq: Every day | ORAL | Status: DC
Start: 2020-08-17 — End: 2020-08-16

## 2020-08-16 MED ORDER — PANTOPRAZOLE SODIUM 40 MG PO TBEC
40.0000 mg | DELAYED_RELEASE_TABLET | Freq: Two times a day (BID) | ORAL | Status: DC
  Administered 2020-08-17 – 2020-08-18 (×3): 40 mg via ORAL
  Filled 2020-08-16 (×3): qty 1

## 2020-08-16 NOTE — Progress Notes (Signed)
Discussed with Dr. Stanford Breed, will arrange outpatient event monitor as recommended by Dr. Radford Pax. A event monitor will be mailed to the patient.   Our scheduler will contact the patient for follow up in 4-5 weeks by which point heart monitor should be done.   Cardiology signing off, please call us if has further questions.    Almyra Deforest PA Pager: [phone]

## 2020-08-16 NOTE — Discharge Instructions (Signed)
Resume aspirin on 08/24/20

## 2020-08-16 NOTE — Progress Notes (Signed)
Occupational Therapy Treatment Patient Details Name: Maria [name] MRN: [id number] DOB: Jan 30, 1926 Today's Date: 08/31/2020    History of present illness Pt is 86 yo female with PMH including DM2, CKD, HF, HTN, and OSA.  Pt presented to the ED with syncopal symptoms.  Her hgb was 7.2 on 08/11/20 and she received 1 unit PRBC.  Cardiology has also been consulted.   OT comments  Patient having difficult time understanding why she is having so much difficulty with mobility "I don't know what happened to me." Tried educating patient that with increased time in bed patient loses strength and mobility requiring increased assistance, however patient keeps repeating "I don't know what happened to me." patient also interested in getting a wheelchair for home "since I can't walk right now." Educated patient that she would still need to demonstrate safe transfer technique in order to get on/off toilet, in/out bed, ect at home as she only has caregiver assist for few hours in AM. Patient required heavy mod A to complete pivot transfer to recliner with mod cues for hand placement/transfer technique to simulate getting into wheelchair. Explained this to patient and revisited idea of rehab or needing increased help at home.   Follow Up Recommendations  Home health OT;Supervision/Assistance - 24 hour;Other (comment) (vs SNF if unable to have increased home support)    Equipment Recommendations  Wheelchair (measurements OT)       Precautions / Restrictions Precautions Precautions: Fall Precaution Comments: syncope/apply TEDS       Mobility Bed Mobility Overal bed mobility: Needs Assistance Bed Mobility: Supine to Sit     Supine to sit: Min guard;HOB elevated     General bed mobility comments: increased time, heavy use of bed rail for trunk support   Transfers Overall transfer level: Needs assistance Equipment used: None Transfers: Stand Pivot Transfers   Stand pivot transfers: Mod assist        General transfer comment: please see toilet transfer in ADL section    Balance Overall balance assessment: Needs assistance Sitting-balance support: Feet supported Sitting balance-Leahy Scale: Fair     Standing balance support: During functional activity Standing balance-Leahy Scale: Poor Standing balance comment: mod A from OT                           ADL either performed or assessed with clinical judgement   ADL Overall ADL's : Needs assistance/impaired     Grooming: Wash/dry face;Set up;Sitting               Lower Body Dressing: Moderate assistance;Sitting/lateral leans Lower Body Dressing Details (indicate cue type and reason): at EOB patient able to don L sock with increased time, due to swelling and R knee pain patient require total A to don. would require increased assist for clothing management over hips in standing due to decreased standing balance Toilet Transfer: Moderate assistance;Stand-pivot;BSC Toilet Transfer Details (indicate cue type and reason): to recliner, simulated as if transferring to w/c as patient is interested in w/c for home "I can't walk right now" patient still a heavy mod A pivoting to chair due to R knee pain and decreased mobility. patient high fall risk currently.         Functional mobility during ADLs: Moderate assistance;Cueing for safety;Cueing for sequencing                 Cognition Arousal/Alertness: Awake/alert Behavior During Therapy: WFL for tasks assessed/performed Overall Cognitive Status: Within Functional  Limits for tasks assessed                                                General Comments patient denies dizziness during session, primary complaint is pain     Pertinent Vitals/ Pain       Pain Assessment: Faces Faces Pain Scale: Hurts even more Pain Location: R knee - with weight bearing Pain Descriptors / Indicators: Grimacing Pain Intervention(s): Premedicated before  session         Frequency  Min 2X/week        Progress Toward Goals  OT Goals(current goals can now be found in the care plan section)  Progress towards OT goals: Progressing toward goals  Acute Rehab OT Goals Patient Stated Goal: be able to go home OT Goal Formulation: With patient Time For Goal Achievement: 08/26/20 Potential to Achieve Goals: Good ADL Goals Pt Will Perform Lower Body Dressing: with modified independence;sit to/from stand Pt Will Transfer to Toilet: with modified independence;ambulating;bedside commode Pt Will Perform Toileting - Clothing Manipulation and hygiene: with modified independence;sit to/from stand  Plan Discharge plan remains appropriate       AM-PAC OT "6 Clicks" Daily Activity     Outcome Measure   Help from another person eating meals?: A Little Help from another person taking care of personal grooming?: A Little Help from another person toileting, which includes using toliet, bedpan, or urinal?: A Lot Help from another person bathing (including washing, rinsing, drying)?: A Lot Help from another person to put on and taking off regular upper body clothing?: A Little Help from another person to put on and taking off regular lower body clothing?: A Lot 6 Click Score: 15    End of Session    OT Visit Diagnosis: Unsteadiness on feet (R26.81);Pain;Dizziness and giddiness (R42) Pain - Right/Left: Right Pain - part of body: Knee   Activity Tolerance Patient limited by pain   Patient Left in chair;with call bell/phone within reach;with chair alarm set   Nurse Communication Mobility status        Time: 4034-7425 OT Time Calculation (min): 24 min  Charges: OT General Charges $OT Visit: 1 Visit OT Treatments $Self Care/Home Management : 23-37 mins  Delbert Phenix OT OT pager: [place] 09/03/2020, 10:15 AM

## 2020-08-16 NOTE — Care Management Important Message (Signed)
Important Message  Patient Details IM Letter given to the Patient. Name: Maria [name] MRN: [id number] Date of Birth: [birth year]   Medicare Important Message Given:  Yes     Kerin Salen 09/07/2020, 11:24 AM

## 2020-08-16 NOTE — Progress Notes (Addendum)
CSW received a call from pt's EPD stating pt was inpatient and refused assistance with SNF placement  and was discharged this afternoon (08/27/2020).  EPD states pt states she has no active complaints other than still retaining an inability to care for herself and has changed her mind and is requesting placement.  11:08 PM CSW spoke with pt and confirmed pt's plan to be discharged to SNF to for rehab at discharge from the ED.  CSW provided active listening and validated pt's concerns that she cannot care for herself at home and has no one to come into the home to help her at tyis time and needs rehab to regain strenght after her hospital stay.   CSW was given permission to complete FL-2 and send referrals out to SNF facilities via the hub, per pt's request.  Pt has been living independently prior to being admitted to [facility].  Pt was A&OX4, and presented with a calm and pleasant affect and was appreciative and thanked the CSW.  EDP agrees to order a PT consult and to initiate a rapid COVID test.  CSW will continue to follow for D/C needs.  Maria [name]  MSW, LCSW, LCAS, CCS Transitions of Care Clinical Social Worker Care Coordination Department Ph: [phone]

## 2020-08-16 NOTE — Consult Note (Signed)
   [facility] CM Inpatient Consult   08/28/2020  Maria [name] Aug 01, 1926 [id number]   Patient is currently active with [facility] for chronic disease management services.  Patient has been working with primary care office embedded care management team, RN, SW, and pharmacy team in order to assist in managing chronic disease, provide community social needs, and medication assistance.   Per chart review, recommendation is for SNF, however, patient refusing SNF level of care. Will update care management team at [facility] of patient progression and disposition plans.  Of note, [facility] Care Management services does not replace or interfere with any services that are arranged by inpatient case management or social work.  [name], MSN, [facility] Liaison Nurse Mobile Phone [phone]  Toll free office [phone]

## 2020-08-16 NOTE — Discharge Summary (Signed)
Physician Discharge Summary   Maria [name]:[id number] DOB: 06/26/1926 DOA: 08/10/2020  PCP: Glendale Chard, MD  Admit date: 08/10/2020 Discharge date: 08/26/2020  Admitted From: home Disposition:  Home with [facility]  Discharging physician: Dwyane Dee, MD  Recommendations for Outpatient Follow-up:  1. Continue with H-H, patient declined rehab despite multiple attempts/offers 2. Cardiology will arrange for outpatient event monitor and outpt followup 3. BP regimen adjusted at discharge; needs ongoing adjustment if still hypotensive and/or if BP has increased 4. Follow up with GI as needed  Home Health: PT, OT Equipment/Devices: WC  Patient discharged to home in Discharge Condition: stable CODE STATUS: Full Diet recommendation:  Diet Orders (From admission, onward)    Start     Ordered   08/11/2020 0000  Diet - low sodium heart healthy        08/12/2020 1158          Hospital Course: Valor E Sellersis a 86 y.o.femalewith medical history significant ofDM2, CKD3b, HTN, HFpEF. Patient presented after a syncopal episode at home with associated dizziness and nausea.  It was reported that she was unconscious for approximately 5 to 10 seconds before spontaneously regaining consciousness.  She was brought to the hospital via EMS for further evaluation.  Caregiver on admission had reported that patient was having intermittent episodes of similar over the last few months. On admission she was evaluated by GI and cardiology.  She was found to have worsening anemia and underwent EGD which revealed gastritis and erosions.  She was started on PPI and told to discontinue all NSAIDs at home (was taking Aleve) but okay for aspirin after holding for 2 weeks (aspirin to be resumed on 08/24/20)  Etiology of her syncope was considered due to overdiuresis/hypotension and multiple antihypertensive medications.  Medications were adjusted during hospitalization and blood pressure recovered. At  discharge she is continued on: lasix 40 mg daily, Toprol 25 mg daily, and telmisartan 20 mg daily. She will need outpatient labs as well.   She was evaluated by physical therapy and required 2+ assist for all mobility.  Despite this, she wished to be discharged home and declined SNF placement despite multiple offers and attempts. PT also noted that patient does not fully understand her mobility deficits and remains a high fall risk and unsteady with mobility.  Despite this, the patient asked for a wheelchair and still wished to return home. Home health was arranged and patient was discharged home per her wish.   Syncope - likely secondary to a combination of anemia and overdiuresis - Echocardiogram done and showed a hyperdynamic left ventricular function, grade 1 diastolic dysfunction and she had an EF of greater than 75%; appeared to show HOCM, cardiology was consulted - Cardiology does not feel that she has true HOCM and Dr. Radford Pax believes that she has hyperdynamic function in the setting of dehydration - Cardiology has reinitiated her beta-blocker, recommended repeat carotid Dopplers, TED hose, and recommended outpatient event monitor - GI consulted; EGD: NSAID related gastritis and gastric ulcers and Cameron's erosions - Per GI: hold ASA for2 weeks from admission, continue BID PPI for 4 months, start PO iron in 1 week.  Physical deconditioning - patient declined SNF multiple times unfortunately - H/H arranged at discharge - wheelchair arranged at discharge - patient high risk for returning to hospital or fall at home  Chronic Diastolic HF - Per caregiver, she had an increase in her diuretic a couple of months ago; she is also on multiple antihypertensive agents, and  at her age 86 not need as much at this point -Hold amlodipine, Lasix, hydralazine, Imdur, telmisartan - continue lasix at lower dose, telmisartan at lower dose, and Toprol; all other home meds  stopped for now - outpt follow up with cardiology  DM2 - Last A1c is 6.0 on 06/13/20  -08/14/20: SSI, glucose checks, DM diet  Gout - resume allopurinol  Seronegative RA - continue plaquenil  Depression and Anxiety - continue citalopram  HTN - see CHF  Iron Deficiency Anemia with concern for acute blood loss anemia - Caregiver states she has been having Dark hard stools  - s/p 1 unit pRBCs - GI onboard; EGD w/ NSAID related gastritis and gastric ulcers and Cameron's erosions; see syncope above - s/p IV iron; start PO iron in 1 week  AKI on CKD3b - She was CKD3b up until the summer; her gfr worsened in September - continue to hold her diuretics  Diarrhea - improving - d/c precautions  HLD - continue atorvastatin, zetia  Principal Diagnosis: Syncope  Discharge Diagnoses: Active Hospital Problems   Diagnosis Date Noted  . Chronic diastolic CHF (congestive heart failure) ([place]) 09/01/2020  . Gastritis 08/19/2020  . Iron deficiency anemia 08/22/2020  . Acute renal failure superimposed on stage 3b chronic kidney disease ([place]) 09/07/2020  . Seronegative rheumatoid arthritis ([place]) 06/02/2020  . Chronic renal disease, stage 3, moderately decreased glomerular filtration rate between 30-59 mL/min/1.73 square meter ([place]) 01/08/2020  . Gout 07/02/2019  . Diabetes mellitus ([place]) 01/31/2012  . Essential hypertension 01/31/2012    Resolved Hospital Problems   Diagnosis Date Noted Date Resolved  . Syncope 08/10/2020 09/01/2020    Discharge Instructions    Diet - low sodium heart healthy   Complete by: As directed    Increase activity slowly   Complete by: As directed      Allergies as of 08/24/2020      Reactions   Ace Inhibitors Other (See Comments)   Angioedema    Valsartan Other (See Comments)      Medication List    STOP taking these medications   amLODipine 5 MG tablet Commonly known as: NORVASC    hydrALAZINE 25 MG tablet Commonly known as: APRESOLINE   isosorbide mononitrate 30 MG 24 hr tablet Commonly known as: IMDUR   metolazone 2.5 MG tablet Commonly known as: ZAROXOLYN   naproxen sodium 220 MG tablet Commonly known as: ALEVE   Ozempic (0.25 or 0.5 MG/DOSE) 2 MG/1.5ML Sopn Generic drug: Semaglutide(0.25 or 0.5MG /DOS)   potassium chloride 10 MEQ tablet Commonly known as: KLOR-CON   predniSONE 5 MG tablet Commonly known as: DELTASONE   Toujeo SoloStar 300 UNIT/ML Solostar Pen Generic drug: insulin glargine (1 Unit Dial)     TAKE these medications   Accu-Chek FastClix Lancets Misc CHECK BLOOD SUGAR BEFORE BREAKFAST AND DINNER   Accu-Chek SmartView test strip Generic drug: glucose blood TEST TWICE DAILY BEFORE BREAKFAST AND DINNER   acetaminophen 500 MG tablet Commonly known as: TYLENOL Take 1 tablet (500 mg total) by mouth every 4 (four) hours as needed for mild pain or moderate pain (or Fever >/= 101).   allopurinol 100 MG tablet Commonly known as: ZYLOPRIM Take 2 tablets (200 mg total) by mouth daily.   aspirin EC 81 MG tablet Take 1 tablet (81 mg total) by mouth daily. Start taking on: August 24, 2020 What changed: These instructions start on August 24, 2020. If you are unsure what to do until then, ask your doctor or other care  provider.   atorvastatin 40 MG tablet Commonly known as: LIPITOR Take 1 tablet (40 mg total) by mouth daily. Monday, Wednesday, and Friday   citalopram 20 MG tablet Commonly known as: CELEXA Take 20 mg by mouth daily.   docusate sodium 100 MG capsule Commonly known as: COLACE Take 100 mg by mouth 2 (two) times daily.   donepezil 10 MG tablet Commonly known as: ARICEPT TAKE 1 TABLET DAILY IN THE EVENING What changed: when to take this   ezetimibe 10 MG tablet Commonly known as: ZETIA TAKE 1 TABLET AT BEDTIME What changed: when to take this   ferrous sulfate 325 (65 FE) MG EC tablet Take 1 tablet (325 mg  total) by mouth 2 (two) times daily. What changed: when to take this   Folbic 2.5-25-2 MG Tabs tablet Generic drug: folic acid-pyridoxine-cyancobalamin TAKE 1 TABLET DAILY   furosemide 40 MG tablet Commonly known as: LASIX Take 1 tablet (40 mg total) by mouth daily. What changed: See the new instructions.   hydroxychloroquine 200 MG tablet Commonly known as: PLAQUENIL Take 1 tablet (200 mg total) by mouth daily.   Insulin Pen Needle 32G X 6 MM Misc Commonly known as: BD Pen Needle Micro U/F Use as directed   metoprolol succinate 25 MG 24 hr tablet Commonly known as: TOPROL-XL TAKE 1 TABLET(25 MG) BY MOUTH DAILY What changed: See the new instructions.   Muscle Rub 10-15 % Crea Apply 1 application topically as needed for muscle pain.   pantoprazole 40 MG tablet Commonly known as: PROTONIX Take 1 tablet (40 mg total) by mouth 2 (two) times daily before a meal.   telmisartan 20 MG tablet Commonly known as: MICARDIS Take 1 tablet (20 mg total) by mouth daily. What changed:   medication strength  how much to take       Follow-up Information    Juanita Craver, MD. Schedule an appointment as soon as possible for a visit.   Specialty: Gastroenterology Why: call office to arrange follow up appt for stomach ulcers. call office if any stomach problems, complaints or to answer any questions re meds.   Contact information: [address] [place] [address] [phone]        Lorretta Harp, MD. Schedule an appointment as soon as possible for a visit in 1 week(s).   Specialties: Cardiology, Radiology Why: office scheduler will reach out to you to arrange follow up and heart monitor. Please give [name] a call if you do not hear from our scheduler in 1 week Contact information: [place] [address] [phone]              Allergies  Allergen Reactions  . Ace Inhibitors Other (See Comments)    Angioedema   .  Valsartan Other (See Comments)    Consultations: Cardiology GI  Discharge Exam: BP 109/70 (BP Location: Right Arm)   Pulse 89   Temp 98.9 F (37.2 C) (Oral)   Resp 18   Ht 4\' 11"  (1.499 m)   Wt 70.2 kg   SpO2 97%   BMI 31.26 kg/m  General appearance: alert, cooperative and no distress Head: Normocephalic, without obvious abnormality, atraumatic Eyes: EOMI Lungs: clear to auscultation bilaterally Heart: regular rate and rhythm and S1, S2 normal Abdomen: normal findings: bowel sounds normal and soft, non-tender Extremities: chronic LE edema noted Skin: mobility and turgor normal Neurologic: Grossly normal  The results of significant diagnostics from this hospitalization (including imaging, microbiology,  ancillary and laboratory) are listed below for reference.   Microbiology: Recent Results (from the past 240 hour(s))  Resp Panel by RT-PCR (Flu A&B, Covid) Nasopharyngeal Swab     Status: None   Collection Time: 08/10/20 10:22 AM   Specimen: Nasopharyngeal Swab; Nasopharyngeal(NP) swabs in vial transport medium  Result Value Ref Range Status   SARS Coronavirus 2 by RT PCR NEGATIVE NEGATIVE Final    Comment: (NOTE) SARS-CoV-2 target nucleic acids are NOT DETECTED.  The SARS-CoV-2 RNA is generally detectable in upper respiratory specimens during the acute phase of infection. The lowest concentration of SARS-CoV-2 viral copies this assay can detect is 138 copies/mL. A negative result does not preclude SARS-Cov-2 infection and should not be used as the sole basis for treatment or other patient management decisions. A negative result may occur with  improper specimen collection/handling, submission of specimen other than nasopharyngeal swab, presence of viral mutation(s) within the areas targeted by this assay, and inadequate number of viral copies(<138 copies/mL). A negative result must be combined with clinical observations, patient history, and  epidemiological information. The expected result is Negative.  Fact Sheet for Patients:  EntrepreneurPulse.com.au  Fact Sheet for Healthcare Providers:  IncredibleEmployment.be  This test is no t yet approved or cleared by the [place] FDA and  has been authorized for detection and/or diagnosis of SARS-CoV-2 by FDA under an Emergency Use Authorization (EUA). This EUA will remain  in effect (meaning this test can be used) for the duration of the COVID-19 declaration under Section 564(b)(1) of the Act, 21 U.S.C.section 360bbb-3(b)(1), unless the authorization is terminated  or revoked sooner.       Influenza A by PCR NEGATIVE NEGATIVE Final   Influenza B by PCR NEGATIVE NEGATIVE Final    Comment: (NOTE) The Xpert Xpress SARS-CoV-2/FLU/RSV plus assay is intended as an aid in the diagnosis of influenza from Nasopharyngeal swab specimens and should not be used as a sole basis for treatment. Nasal washings and aspirates are unacceptable for Xpert Xpress SARS-CoV-2/FLU/RSV testing.  Fact Sheet for Patients: EntrepreneurPulse.com.au  Fact Sheet for Healthcare Providers: IncredibleEmployment.be  This test is not yet approved or cleared by the [place] FDA and has been authorized for detection and/or diagnosis of SARS-CoV-2 by FDA under an Emergency Use Authorization (EUA). This EUA will remain in effect (meaning this test can be used) for the duration of the COVID-19 declaration under Section 564(b)(1) of the Act, 21 U.S.C. section 360bbb-3(b)(1), unless the authorization is terminated or revoked.  Performed at [facility], [place] [address] [place], [place] [address]   Gastrointestinal Panel by PCR , Stool     Status: None   Collection Time: 08/12/20  6:00 PM   Specimen: Stool  Result Value Ref Range Status   Campylobacter species NOT DETECTED NOT DETECTED Final   Plesimonas  shigelloides NOT DETECTED NOT DETECTED Final   Salmonella species NOT DETECTED NOT DETECTED Final   Yersinia enterocolitica NOT DETECTED NOT DETECTED Final   Vibrio species NOT DETECTED NOT DETECTED Final   Vibrio cholerae NOT DETECTED NOT DETECTED Final   Enteroaggregative E coli (EAEC) NOT DETECTED NOT DETECTED Final   Enteropathogenic E coli (EPEC) NOT DETECTED NOT DETECTED Final   Enterotoxigenic E coli (ETEC) NOT DETECTED NOT DETECTED Final   Shiga like toxin producing E coli (STEC) NOT DETECTED NOT DETECTED Final   Shigella/Enteroinvasive E coli (EIEC) NOT DETECTED NOT DETECTED Final   Cryptosporidium NOT DETECTED NOT DETECTED Final   Cyclospora cayetanensis NOT  DETECTED NOT DETECTED Final   Entamoeba histolytica NOT DETECTED NOT DETECTED Final   Giardia lamblia NOT DETECTED NOT DETECTED Final   Adenovirus F40/41 NOT DETECTED NOT DETECTED Final   Astrovirus NOT DETECTED NOT DETECTED Final   Norovirus GI/GII NOT DETECTED NOT DETECTED Final   Rotavirus A NOT DETECTED NOT DETECTED Final   Sapovirus (I, II, IV, and V) NOT DETECTED NOT DETECTED Final    Comment: Performed at [facility], [place] [place],  [address]     Labs: BNP (last 3 results) No results for input(s): BNP in the last 8760 hours. Basic Metabolic Panel: Recent Labs  Lab 08/11/20 0650 08/12/20 0625 08/13/20 0703 08/14/20 0654 08/15/20 0600  NA 142 139 140 137 138  K 3.3* 4.2 4.2 4.2 4.4  CL 109 107 105 106 104  CO2 25 24 25 23 25   GLUCOSE 95 90 99 85 105*  BUN 29* 22 17 18 19   CREATININE 1.72* 1.39* 1.18* 1.12* 1.07*  CALCIUM 8.1* 8.5* 8.3* 8.5* 8.4*  MG 1.7 1.7 2.0 1.7 1.7  PHOS 2.5 2.3* 3.4 3.3 2.9   Liver Function Tests: Recent Labs  Lab 08/11/20 0650 08/12/20 0625 08/13/20 0703 08/14/20 0654 08/15/20 0600  AST 21 23 23   --   --   ALT 13 16 16   --   --   ALKPHOS 45 48 56  --   --   BILITOT 0.5 0.8 0.7  --   --   PROT 4.7* 5.2* 5.2*  --   --   ALBUMIN 2.6* 2.7* 2.5*  2.5* 2.4*   No results for input(s): LIPASE, AMYLASE in the last 168 hours. No results for input(s): AMMONIA in the last 168 hours. CBC: Recent Labs  Lab 08/10/20 0841 08/10/20 1535 08/12/20 0625 08/12/20 1427 08/13/20 0703 08/13/20 1613 08/14/20 0654 08/14/20 0654 08/14/20 1437 08/15/20 0600 08/15/20 1254 08/15/20 2247 08/30/2020 0633  WBC 6.7  --  5.2  --  6.4  --  6.3  --   --  8.3  --   --   --   NEUTROABS 5.3  --  3.4  --  4.4  --  4.6  --   --  6.3  --   --   --   HGB 8.2*   < > 9.4*   < > 9.4*  9.5*   < > 9.7*   < > 9.9* 9.5* 9.6* 8.9* 9.2*  HCT 27.2*   < > 30.0*   < > 30.6*  30.8*   < > 31.2*   < > 31.9* 30.6* 31.0* 28.2* 30.4*  MCV 93.8  --  90.1  --  90.8  --  91.2  --   --  91.1  --   --   --   PLT 219  --  169  --  193  --  164  --   --  142*  --   --   --    < > = values in this interval not displayed.   Cardiac Enzymes: No results for input(s): CKTOTAL, CKMB, CKMBINDEX, TROPONINI in the last 168 hours. BNP: Invalid input(s): POCBNP CBG: Recent Labs  Lab 08/15/20 0837 08/15/20 1239 08/15/20 1758 08/15/20 2240 08/23/2020 0805  GLUCAP 113* 148* 222* 167* 135*   D-Dimer No results for input(s): DDIMER in the last 72 hours. Hgb A1c No results for input(s): HGBA1C in the last 72 hours. Lipid Profile No results for input(s): CHOL, HDL, LDLCALC, TRIG, CHOLHDL,  LDLDIRECT in the last 72 hours. Thyroid function studies No results for input(s): TSH, T4TOTAL, T3FREE, THYROIDAB in the last 72 hours.  Invalid input(s): FREET3 Anemia work up No results for input(s): VITAMINB12, FOLATE, FERRITIN, TIBC, IRON, RETICCTPCT in the last 72 hours. Urinalysis    Component Value Date/Time   COLORURINE YELLOW 09/01/2016 1943   APPEARANCEUR HAZY (A) 09/01/2016 1943   LABSPEC 1.013 09/01/2016 1943   PHURINE 5.0 09/01/2016 1943   GLUCOSEU NEGATIVE 09/01/2016 1943   HGBUR NEGATIVE 09/01/2016 1943   BILIRUBINUR negative 01/06/2020 [place] 09/01/2016 1943    PROTEINUR Negative 01/06/2020 1122   PROTEINUR NEGATIVE 09/01/2016 1943   UROBILINOGEN 0.2 01/06/2020 1122   UROBILINOGEN 0.2 03/04/2013 0832   NITRITE negative 01/06/2020 1122   NITRITE NEGATIVE 09/01/2016 1943   LEUKOCYTESUR Negative 01/06/2020 1122   Sepsis Labs Invalid input(s): PROCALCITONIN,  WBC,  LACTICIDVEN Microbiology Recent Results (from the past 240 hour(s))  Resp Panel by RT-PCR (Flu A&B, Covid) Nasopharyngeal Swab     Status: None   Collection Time: 08/10/20 10:22 AM   Specimen: Nasopharyngeal Swab; Nasopharyngeal(NP) swabs in vial transport medium  Result Value Ref Range Status   SARS Coronavirus 2 by RT PCR NEGATIVE NEGATIVE Final    Comment: (NOTE) SARS-CoV-2 target nucleic acids are NOT DETECTED.  The SARS-CoV-2 RNA is generally detectable in upper respiratory specimens during the acute phase of infection. The lowest concentration of SARS-CoV-2 viral copies this assay can detect is 138 copies/mL. A negative result does not preclude SARS-Cov-2 infection and should not be used as the sole basis for treatment or other patient management decisions. A negative result may occur with  improper specimen collection/handling, submission of specimen other than nasopharyngeal swab, presence of viral mutation(s) within the areas targeted by this assay, and inadequate number of viral copies(<138 copies/mL). A negative result must be combined with clinical observations, patient history, and epidemiological information. The expected result is Negative.  Fact Sheet for Patients:  EntrepreneurPulse.com.au  Fact Sheet for Healthcare Providers:  IncredibleEmployment.be  This test is no t yet approved or cleared by the [place] FDA and  has been authorized for detection and/or diagnosis of SARS-CoV-2 by FDA under an Emergency Use Authorization (EUA). This EUA will remain  in effect (meaning this test can be used) for the duration of  the COVID-19 declaration under Section 564(b)(1) of the Act, 21 U.S.C.section 360bbb-3(b)(1), unless the authorization is terminated  or revoked sooner.       Influenza A by PCR NEGATIVE NEGATIVE Final   Influenza B by PCR NEGATIVE NEGATIVE Final    Comment: (NOTE) The Xpert Xpress SARS-CoV-2/FLU/RSV plus assay is intended as an aid in the diagnosis of influenza from Nasopharyngeal swab specimens and should not be used as a sole basis for treatment. Nasal washings and aspirates are unacceptable for Xpert Xpress SARS-CoV-2/FLU/RSV testing.  Fact Sheet for Patients: EntrepreneurPulse.com.au  Fact Sheet for Healthcare Providers: IncredibleEmployment.be  This test is not yet approved or cleared by the [place] FDA and has been authorized for detection and/or diagnosis of SARS-CoV-2 by FDA under an Emergency Use Authorization (EUA). This EUA will remain in effect (meaning this test can be used) for the duration of the COVID-19 declaration under Section 564(b)(1) of the Act, 21 U.S.C. section 360bbb-3(b)(1), unless the authorization is terminated or revoked.  Performed at [facility], [place] [address], [place], [place] [address]   Gastrointestinal Panel by PCR , Stool     Status: None  Collection Time: 08/12/20  6:00 PM   Specimen: Stool  Result Value Ref Range Status   Campylobacter species NOT DETECTED NOT DETECTED Final   Plesimonas shigelloides NOT DETECTED NOT DETECTED Final   Salmonella species NOT DETECTED NOT DETECTED Final   Yersinia enterocolitica NOT DETECTED NOT DETECTED Final   Vibrio species NOT DETECTED NOT DETECTED Final   Vibrio cholerae NOT DETECTED NOT DETECTED Final   Enteroaggregative E coli (EAEC) NOT DETECTED NOT DETECTED Final   Enteropathogenic E coli (EPEC) NOT DETECTED NOT DETECTED Final   Enterotoxigenic E coli (ETEC) NOT DETECTED NOT DETECTED Final   Shiga like toxin producing E coli (STEC)  NOT DETECTED NOT DETECTED Final   Shigella/Enteroinvasive E coli (EIEC) NOT DETECTED NOT DETECTED Final   Cryptosporidium NOT DETECTED NOT DETECTED Final   Cyclospora cayetanensis NOT DETECTED NOT DETECTED Final   Entamoeba histolytica NOT DETECTED NOT DETECTED Final   Giardia lamblia NOT DETECTED NOT DETECTED Final   Adenovirus F40/41 NOT DETECTED NOT DETECTED Final   Astrovirus NOT DETECTED NOT DETECTED Final   Norovirus GI/GII NOT DETECTED NOT DETECTED Final   Rotavirus A NOT DETECTED NOT DETECTED Final   Sapovirus (I, II, IV, and V) NOT DETECTED NOT DETECTED Final    Comment: Performed at [facility], [address], [place], [place] [address]    Procedures/Studies: US RENAL  Result Date: 08/10/2020 CLINICAL DATA:  Inpatient.  Acute kidney injury. EXAM: RENAL / URINARY TRACT ULTRASOUND COMPLETE COMPARISON:  04/25/2020 renal sonogram. FINDINGS: Right Kidney: Renal measurements: 9.5 x 4.9 x 5.4 cm = volume: 132 mL. No hydronephrosis. Mildly echogenic right renal parenchyma, mildly atrophic (9 mm thickness). Simple 1.1 x 0.8 x 0.8 cm renal cyst in lower right kidney. Left Kidney: Renal measurements: 10.4 x 5.8 x 5.1 cm = volume: 160 mL. No hydronephrosis. Echogenic left renal parenchymar, mildly atrophic (9 mm thickness). Simple 1.1 x 0.8 x 0.9 cm lower left renal cyst. Bladder: Not visualized. Other: None. IMPRESSION: 1. No hydronephrosis. 2. Mildly atrophic and echogenic kidneys bilaterally, compatible with provided history of nonspecific acute renal parenchymal disease, probably acute on chronic. 3. Small simple renal cysts bilaterally. Electronically Signed   By: Ilona Sorrel M.D.   On: 08/10/2020 16:25   Portable Chest 1 View  Result Date: 08/10/2020 CLINICAL DATA:  86 yo F with a chief complaints of a syncopal event. Patient states that she was making breakfast and her home health aide who is over she suddenly felt bad like she may pass out and the nurses aide made her sit down  in her rolling chair and she passed out. She denied any chest pain or pressure. Denied headache or neck pain. Denies cough congestion or fever denies nausea vomiting or diarrhea EXAM: PORTABLE CHEST 1 VIEW COMPARISON:  11/07/2010 FINDINGS: Cardiac silhouette is normal in size. No mediastinal or hilar masses. Clear lungs.  No convincing pleural effusion and no pneumothorax. Skeletal structures are grossly intact. IMPRESSION: No active disease. Electronically Signed   By: Lajean Manes M.D.   On: 08/10/2020 15:33   ECHOCARDIOGRAM COMPLETE  Result Date: 08/11/2020    ECHOCARDIOGRAM REPORT   Patient Name:   ALAINAH PHANG Russman Date of Exam: 08/11/2020 Medical Rec #:  [id number]        Height:       59.0 in Accession #:    [id number]       Weight:       149.5 lb Date of Birth:  [birth year]  BSA:          1.630 m Patient Age:    67 years         BP:           122/59 mmHg Patient Gender: F                HR:           86 bpm. Exam Location:  Inpatient Procedure: 2D Echo, Cardiac Doppler and Color Doppler Indications:    R55 Syncope  History:        Patient has prior history of Echocardiogram examinations, most                 recent 04/20/2019. Abnormal ECG, TIA, Signs/Symptoms:Dyspnea and                 Shortness of Breath; Risk Factors:Dyslipidemia, Hypertension and                 Diabetes.  Sonographer:    Roseanna Rainbow RDCS Referring Phys: [id number] Jonnie Finner  Sonographer Comments: Technically difficult study due to poor echo windows. IMPRESSIONS  1. Hyperdynamic LV function; proximal septal thickening with systolic anterior motion of MV; LVOT velocity of 2.5 m/s that increased to 3.5 m/s; findings c/w HOCM.  2. Left ventricular ejection fraction, by estimation, is >75%. The left ventricle has hyperdynamic function. The left ventricle has no regional wall motion abnormalities. There is mild left ventricular hypertrophy. Left ventricular diastolic parameters are consistent with Grade I diastolic dysfunction (impaired  relaxation).  3. Right ventricular systolic function is normal. The right ventricular size is normal.  4. The mitral valve is normal in structure. No evidence of mitral valve regurgitation. No evidence of mitral stenosis.  5. The aortic valve has an indeterminant number of cusps. Aortic valve regurgitation is not visualized. No aortic stenosis is present.  6. The inferior vena cava is normal in size with greater than 50% respiratory variability, suggesting right atrial pressure of 3 mmHg. FINDINGS  Left Ventricle: Left ventricular ejection fraction, by estimation, is >75%. The left ventricle has hyperdynamic function. The left ventricle has no regional wall motion abnormalities. The left ventricular internal cavity size was normal in size. There is mild left ventricular hypertrophy. Left ventricular diastolic parameters are consistent with Grade I diastolic dysfunction (impaired relaxation). Right Ventricle: The right ventricular size is normal.Right ventricular systolic function is normal. Left Atrium: Left atrial size was normal in size. Right Atrium: Right atrial size was normal in size. Pericardium: There is no evidence of pericardial effusion. Mitral Valve: The mitral valve is normal in structure. No evidence of mitral valve regurgitation. No evidence of mitral valve stenosis. MV peak gradient, 7.8 mmHg. The mean mitral valve gradient is 2.0 mmHg. Tricuspid Valve: The tricuspid valve is normal in structure. Tricuspid valve regurgitation is trivial. No evidence of tricuspid stenosis. Aortic Valve: The aortic valve has an indeterminant number of cusps. Aortic valve regurgitation is not visualized. No aortic stenosis is present. Aortic valve mean gradient measures 13.0 mmHg. Aortic valve peak gradient measures 22.1 mmHg. Aortic valve area, by VTI measures 1.76 cm. Pulmonic Valve: The pulmonic valve was not well visualized. Pulmonic valve regurgitation is not visualized. No evidence of pulmonic stenosis. Aorta:  The aortic root is normal in size and structure. Venous: The inferior vena cava is normal in size with greater than 50% respiratory variability, suggesting right atrial pressure of 3 mmHg.  Additional Comments: Hyperdynamic LV function; proximal septal thickening  with systolic anterior motion of MV; LVOT velocity of 2.5 m/s that increased to 3.5 m/s; findings c/w HOCM.  LEFT VENTRICLE PLAX 2D LVIDd:         2.93 cm     Diastology LVIDs:         1.84 cm     LV e' medial:    8.43 cm/s LV PW:         1.34 cm     LV E/e' medial:  10.1 LV IVS:        1.19 cm     LV e' lateral:   8.05 cm/s LVOT diam:     1.75 cm     LV E/e' lateral: 10.6 LV SV:         77 LV SV Index:   47 LVOT Area:     2.41 cm  LV Volumes (MOD) LV vol d, MOD A2C: 49.6 ml LV vol d, MOD A4C: 44.6 ml LV vol s, MOD A2C: 16.8 ml LV vol s, MOD A4C: 10.2 ml LV SV MOD A2C:     32.8 ml LV SV MOD A4C:     44.6 ml LV SV MOD BP:      36.6 ml RIGHT VENTRICLE             IVC RV S prime:     12.70 cm/s  IVC diam: 1.36 cm TAPSE (M-mode): 1.6 cm LEFT ATRIUM             Index       RIGHT ATRIUM           Index LA diam:        3.10 cm 1.90 cm/m  RA Area:     13.20 cm LA Vol (A2C):   36.1 ml 22.15 ml/m RA Volume:   28.80 ml  17.67 ml/m LA Vol (A4C):   31.4 ml 19.27 ml/m LA Biplane Vol: 35.0 ml 21.48 ml/m  AORTIC VALVE AV Area (Vmax):    1.86 cm AV Area (Vmean):   1.85 cm AV Area (VTI):     1.76 cm AV Vmax:           235.00 cm/s AV Vmean:          169.000 cm/s AV VTI:            0.439 m AV Peak Grad:      22.1 mmHg AV Mean Grad:      13.0 mmHg LVOT Vmax:         182.00 cm/s LVOT Vmean:        130.000 cm/s LVOT VTI:          0.321 m LVOT/AV VTI ratio: 0.73  AORTA Ao Root diam: 3.00 cm MITRAL VALVE MV Area (PHT): 4.68 cm     SHUNTS MV Peak grad:  7.8 mmHg     Systemic VTI:  0.32 m MV Mean grad:  2.0 mmHg     Systemic Diam: 1.75 cm MV Vmax:       1.40 m/s MV Vmean:      62.8 cm/s MV Decel Time: 162 msec MV E velocity: 85.30 cm/s MV A velocity: 127.00 cm/s MV E/A  ratio:  0.67 Kirk Ruths MD Electronically signed by Kirk Ruths MD Signature Date/Time: 08/11/2020/1:08:20 PM    Final    XR Knee Complete 4 Views Right  Result Date: 08/09/2020 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 Risk Factors:      Hypertension, hyperlipidemia, prior CVA. Comparison Study:  no prior Performing Technologist: Abram Sander RVS  Examination Guidelines: A complete evaluation includes B-mode imaging, spectral Doppler, color Doppler, and power Doppler as needed of all accessible portions of each vessel. Bilateral testing is considered an integral part of a complete examination. Limited examinations for reoccurring indications may be performed as noted.  Right Carotid Findings: +----------+--------+--------+--------+------------------+--------+           PSV cm/sEDV cm/sStenosisPlaque DescriptionComments +----------+--------+--------+--------+------------------+--------+ CCA Prox  65      13              heterogenous               +----------+--------+--------+--------+------------------+--------+ CCA Distal63      17              heterogenous               +----------+--------+--------+--------+------------------+--------+ ICA Prox  59      10      1-39%   heterogenous               +----------+--------+--------+--------+------------------+--------+ ICA Distal69      17                                         +----------+--------+--------+--------+------------------+--------+ ECA       82                                                 +----------+--------+--------+--------+------------------+--------+ +----------+--------+-------+--------+-------------------+           PSV cm/sEDV cmsDescribeArm Pressure (mmHG) +----------+--------+-------+--------+-------------------+ [id number]                                          +----------+--------+-------+--------+-------------------+ +---------+--------+--+--------+--+---------+ VertebralPSV cm/s63EDV cm/s14Antegrade +---------+--------+--+--------+--+---------+  Left Carotid Findings: +----------+--------+--------+--------+------------------+--------+           PSV cm/sEDV cm/sStenosisPlaque DescriptionComments +----------+--------+--------+--------+------------------+--------+ CCA Prox  99      19              heterogenous               +----------+--------+--------+--------+------------------+--------+ CCA Distal65      11              heterogenous               +----------+--------+--------+--------+------------------+--------+ ICA Prox  52      21      1-39%   heterogenous      tortuous +----------+--------+--------+--------+------------------+--------+ ICA Distal77      22                                         +----------+--------+--------+--------+------------------+--------+ ECA       91                                                 +----------+--------+--------+--------+------------------+--------+ +----------+--------+--------+--------+-------------------+  PSV cm/sEDV cm/sDescribeArm Pressure (mmHG) +----------+--------+--------+--------+-------------------+ [id number]                                          +----------+--------+--------+--------+-------------------+ +---------+--------+---+--------+--+---------+ VertebralPSV cm/s103EDV cm/s13Antegrade +---------+--------+---+--------+--+---------+   Summary: Right Carotid: Velocities in the right ICA are consistent with a 1-39% stenosis. Left Carotid: Velocities in the left ICA are consistent with a 1-39% stenosis. Vertebrals: Bilateral vertebral arteries demonstrate antegrade flow. *See table(s) above for measurements and observations.  Electronically signed by Ruta Hinds MD on 08/15/2020 at 5:03:40 PM.    Final      Time  coordinating discharge: Over 70 minutes    Dwyane Dee, MD  [facility] 08/15/2020, 6:41 PM

## 2020-08-16 NOTE — TOC Transition Note (Signed)
Transition of Care [facility]) - CM/SW Discharge Note   Patient Details  Name: Maria [name] MRN: [id number] Date of Birth: 1926/01/12  [facility] CM/SW Contact:  Lynnell Catalan, RN Phone Number: 08/20/2020, 10:09 AM   Clinical Narrative:    Spoke with pt again at length for dc planning. Pt insistent that she is going home and not to SNF. Concern was expressed to her about her ability to get around at home. She continues to express that she will be fine at home and requests a wheelchair for home use. Wheelchair ordered from [employer].   Final next level of care: [place] Barriers to Discharge: Continued Medical Work up   Patient Goals and CMS Choice Patient states their goals for this hospitalization and ongoing recovery are:: To get home CMS Medicare.gov Compare Post Acute Care list provided to:: Patient Choice offered to / list presented to : Patient   Discharge Plan and Services   Discharge Planning Services: CM Consult Post Acute Care Choice: Home Health                    HH Arranged: PT, OT [facility] Agency: [place] ([facility]) Date [facility] Agency Contacted: 08/15/20 Time HH Agency Contacted: [phone] Representative spoke with at [place]: [place] ([place]) Interventions     Readmission Risk Interventions No flowsheet data found.

## 2020-08-16 NOTE — Progress Notes (Signed)
    Durable Medical Equipment  (From admission, onward)         Start     Ordered   09/05/2020 1005  For home use only DME lightweight manual wheelchair with seat cushion  Once       Comments: Patient suffers from weakness which impairs their ability to perform daily activities like walking in the home.  A walker will not resolve  issue with performing activities of daily living. A wheelchair will allow patient to safely perform daily activities. Patient is not able to propel themselves in the home using a standard weight wheelchair due to weakness Patient can self propel in the lightweight wheelchair. Length of need lifetime. Accessories: elevating leg rests (ELRs), wheel locks, extensions and anti-tippers.   09/03/2020 1006

## 2020-08-16 NOTE — ED Provider Notes (Signed)
[facility] DEPT Provider Note   CSN: [id number] Arrival date & time: 08/22/2020  1749     History Chief Complaint  Patient presents with  . SNF placement    Maria [name] is a 86 y.o. female.  Presents to ER for SNF placement.  Patient was recently admitted for syncope, physical deconditioning, chronic diastolic heart failure.  During admission, physical therapy and multiple riders recommended patient be sent to inpatient rehab, SNF due to her deconditioning.  She required 2 person assist.  Patient adamant about discharge home.  Patient reports that ever since she was discharged, she has not been able to care for herself.  She has no new medical complaints today.  Specifically she denies any episodes of passing out, no falls, no chest pain and abdominal pain concerning symptoms.  HPI     Past Medical History:  Diagnosis Date  . Anxiety   . Arthritis   . Blood transfusion   . Depression   . Diabetes mellitus   . Edema   . Heart murmur   . Hyperlipidemia   . Hypertension   . Sleep apnea    cpap  . Stroke ([place])    3 x tias  . TIA (transient ischemic attack)     Patient Active Problem List   Diagnosis Date Noted  . Chronic diastolic CHF (congestive heart failure) ([place]) 08/15/2020  . Gastritis 08/11/2020  . Iron deficiency anemia 08/11/2020  . Acute renal failure superimposed on stage 3b chronic kidney disease ([place]) 08/11/2020  . Seronegative rheumatoid arthritis ([place]) 06/02/2020  . Chronic renal disease, stage 3, moderately decreased glomerular filtration rate between 30-59 mL/min/1.73 square meter ([place]) 01/08/2020  . Hypertensive heart and kidney disease with chronic diastolic congestive heart failure and stage 3 chronic kidney disease ([place]) 01/06/2020  . Primary osteoarthritis of right knee 01/06/2020  . Joint pain 12/03/2019  . Hav (hallux abducto valgus), right 11/04/2019  . Diabetic peripheral vascular disease ([place]) 09/15/2019  .  Gout 07/02/2019  . Insulin dependent type 2 diabetes mellitus ([place]) 07/02/2019  . Acute on chronic diastolic CHF (congestive heart failure) ([place]) 03/26/2018  . Acute on chronic renal insufficiency 03/26/2018  . Dyspnea on exertion 02/14/2018  . Palpitations 02/13/2017  . Chest pain 03/25/2015  . Acute diverticulitis 03/04/2013  . Bilateral lower extremity edema 01/23/2013  . Hyperlipidemia 01/23/2013  . Diverticulitis 01/31/2012  . Diabetes mellitus ([place]) 01/31/2012  . Essential hypertension 01/31/2012  . Obstructive sleep apnea on CPAP 01/31/2012  . History of TIAs 01/31/2012  . Obesity 01/31/2012  . H/O vertigo 01/31/2012    Past Surgical History:  Procedure Laterality Date  . ABDOMINAL HYSTERECTOMY    . APPENDECTOMY    . BIOPSY  08/13/2020   Procedure: BIOPSY;  Surgeon: Rush Landmark Telford Nab., MD;  Location: [facility];  Service: Gastroenterology;;  . ESOPHAGOGASTRODUODENOSCOPY (EGD) WITH PROPOFOL N/A 08/13/2020   Procedure: ESOPHAGOGASTRODUODENOSCOPY (EGD) WITH PROPOFOL;  Surgeon: Irving Copas., MD;  Location: [facility];  Service: Gastroenterology;  Laterality: N/A;  . HEMOSTASIS CLIP PLACEMENT  08/13/2020   Procedure: HEMOSTASIS CLIP PLACEMENT;  Surgeon: Irving Copas., MD;  Location: [facility];  Service: Gastroenterology;;  . Carrolyn Leigh LTD  [id number]   post stress left ventricle is normal in size, ejection fraction is 65%, normal myocardial perfusion study, low risk scan  . PARTIAL HYSTERECTOMY    . TRANSTHORACIC ECHOCARDIOGRAM  [id number]     OB History   No obstetric history on file.  Family History  Problem Relation Age of Onset  . Healthy Mother   . Prostate cancer Father   . Cervical cancer Granddaughter   . Heart Problems Son   . Prostate cancer Son   . Neuropathy Neg Hx     Social History   Tobacco Use  . Smoking status: Never Smoker  . Smokeless tobacco: Never Used  Vaping Use  . Vaping Use: Never used  Substance Use  Topics  . Alcohol use: No  . Drug use: No    Home Medications Prior to Admission medications   Medication Sig Start Date End Date Taking? Authorizing Provider  Accu-Chek FastClix Lancets MISC CHECK BLOOD SUGAR BEFORE BREAKFAST AND DINNER 03/07/20   Glendale Chard, MD  ACCU-CHEK SMARTVIEW test strip TEST TWICE DAILY BEFORE BREAKFAST AND DINNER 10/05/19   Glendale Chard, MD  acetaminophen (TYLENOL) 500 MG tablet Take 1 tablet (500 mg total) by mouth every 4 (four) hours as needed for mild pain or moderate pain (or Fever >/= 101). 08/17/2020   Dwyane Dee, MD  allopurinol (ZYLOPRIM) 100 MG tablet Take 2 tablets (200 mg total) by mouth daily. 12/01/19   Bo Merino, MD  aspirin EC 81 MG tablet Take 1 tablet (81 mg total) by mouth daily. 08/24/20   Dwyane Dee, MD  atorvastatin (LIPITOR) 40 MG tablet Take 1 tablet (40 mg total) by mouth daily. Monday, Wednesday, and Friday 08/10/2020   Dwyane Dee, MD  citalopram (CELEXA) 20 MG tablet Take 20 mg by mouth daily.     [provider]  docusate sodium (COLACE) 100 MG capsule Take 100 mg by mouth 2 (two) times daily.    [provider]  donepezil (ARICEPT) 10 MG tablet TAKE 1 TABLET DAILY IN THE EVENING Patient taking differently: Take 10 mg by mouth at bedtime.  11/24/18   Glendale Chard, MD  ezetimibe (ZETIA) 10 MG tablet TAKE 1 TABLET AT BEDTIME Patient taking differently: Take 10 mg by mouth daily.  03/29/20   Lorretta Harp, MD  ferrous sulfate 325 (65 FE) MG EC tablet Take 1 tablet (325 mg total) by mouth 2 (two) times daily. Patient taking differently: Take 325 mg by mouth daily.  04/26/20 04/26/21  [provider]  FOLBIC 2.5-25-2 MG TABS tablet TAKE 1 TABLET DAILY Patient taking differently: Take 1 tablet by mouth daily.  01/26/20   Lorretta Harp, MD  furosemide (LASIX) 40 MG tablet Take 1 tablet (40 mg total) by mouth daily. 09/08/2020   Dwyane Dee, MD  hydroxychloroquine (PLAQUENIL) 200 MG tablet Take 1  tablet (200 mg total) by mouth daily. 04/04/20   Ofilia Neas, PA-C  Insulin Pen Needle (BD PEN NEEDLE MICRO U/F) 32G X 6 MM MISC Use as directed 12/18/18   Glendale Chard, MD  Menthol-Methyl Salicylate (MUSCLE RUB) 10-15 % CREA Apply 1 application topically as needed for muscle pain.    [provider]  metoprolol succinate (TOPROL-XL) 25 MG 24 hr tablet TAKE 1 TABLET(25 MG) BY MOUTH DAILY Patient taking differently: Take 25 mg by mouth daily.  01/07/20   Erlene Quan, PA-C  pantoprazole (PROTONIX) 40 MG tablet Take 1 tablet (40 mg total) by mouth 2 (two) times daily before a meal. 09/01/2020   Dwyane Dee, MD  telmisartan (MICARDIS) 20 MG tablet Take 1 tablet (20 mg total) by mouth daily. 09/08/2020   Dwyane Dee, MD    Allergies    Ace inhibitors and Valsartan  Review of Systems   Review of  Systems  Constitutional: Negative for chills and fever.  HENT: Negative for ear pain and sore throat.   Eyes: Negative for pain and visual disturbance.  Respiratory: Negative for cough and shortness of breath.   Cardiovascular: Negative for chest pain and palpitations.  Gastrointestinal: Negative for abdominal pain, nausea and vomiting.  Genitourinary: Negative for dysuria and hematuria.  Musculoskeletal: Negative for arthralgias and back pain.  Skin: Negative for color change and rash.  Neurological: Negative for seizures and syncope.  All other systems reviewed and are negative.   Physical Exam Updated Vital Signs BP (!) 161/68 (BP Location: Right Arm)   Pulse 83   Temp 99.3 F (37.4 C) (Oral)   Resp 16   Ht 4\' 11"  (1.499 m)   Wt 70 kg   SpO2 97%   BMI 31.17 kg/m   Physical Exam Constitutional:      Comments: Chronically ill-appearing but in no distress  HENT:     Head: Normocephalic.     Nose: Nose normal.     Mouth/Throat:     Mouth: Mucous membranes are dry.  Eyes:     Extraocular Movements: Extraocular movements intact.     Pupils: Pupils are equal, round, and  reactive to light.  Cardiovascular:     Rate and Rhythm: Normal rate.     Pulses: Normal pulses.  Pulmonary:     Effort: Pulmonary effort is normal.     Breath sounds: Normal breath sounds.  Abdominal:     General: Abdomen is flat.  Musculoskeletal:        General: No deformity or signs of injury.     Cervical back: No rigidity or tenderness.  Neurological:     General: No focal deficit present.     Mental Status: She is alert.  Psychiatric:        Mood and Affect: Mood normal.        Behavior: Behavior normal.     ED Results / Procedures / Treatments   Labs (all labs ordered are listed, but only abnormal results are displayed) Labs Reviewed  RESP PANEL BY RT-PCR (FLU A&B, COVID) [id number]  CBC WITH DIFFERENTIAL/PLATELET  BASIC METABOLIC PANEL  URINALYSIS, ROUTINE W REFLEX MICROSCOPIC    EKG None  Radiology VAS US CAROTID  Result Date: 08/15/2020 Carotid Arterial Duplex Study Indications:       Syncope. Risk Factors:      Hypertension, hyperlipidemia, prior CVA. Comparison Study:  no prior Performing Technologist: Abram Sander RVS  Examination Guidelines: A complete evaluation includes B-mode imaging, spectral Doppler, color Doppler, and power Doppler as needed of all accessible portions of each vessel. Bilateral testing is considered an integral part of a complete examination. Limited examinations for reoccurring indications may be performed as noted.  Right Carotid Findings: +----------+--------+--------+--------+------------------+--------+           PSV cm/sEDV cm/sStenosisPlaque DescriptionComments +----------+--------+--------+--------+------------------+--------+ CCA Prox  65      13              heterogenous               +----------+--------+--------+--------+------------------+--------+ CCA Distal63      17              heterogenous               +----------+--------+--------+--------+------------------+--------+ ICA Prox  59      10      1-39%    heterogenous               +----------+--------+--------+--------+------------------+--------+  ICA Distal69      17                                         +----------+--------+--------+--------+------------------+--------+ ECA       82                                                 +----------+--------+--------+--------+------------------+--------+ +----------+--------+-------+--------+-------------------+           PSV cm/sEDV cmsDescribeArm Pressure (mmHG) +----------+--------+-------+--------+-------------------+ [id number]                                         +----------+--------+-------+--------+-------------------+ +---------+--------+--+--------+--+---------+ VertebralPSV cm/s63EDV cm/s14Antegrade +---------+--------+--+--------+--+---------+  Left Carotid Findings: +----------+--------+--------+--------+------------------+--------+           PSV cm/sEDV cm/sStenosisPlaque DescriptionComments +----------+--------+--------+--------+------------------+--------+ CCA Prox  99      19              heterogenous               +----------+--------+--------+--------+------------------+--------+ CCA Distal65      11              heterogenous               +----------+--------+--------+--------+------------------+--------+ ICA Prox  52      21      1-39%   heterogenous      tortuous +----------+--------+--------+--------+------------------+--------+ ICA Distal77      22                                         +----------+--------+--------+--------+------------------+--------+ ECA       91                                                 +----------+--------+--------+--------+------------------+--------+ +----------+--------+--------+--------+-------------------+           PSV cm/sEDV cm/sDescribeArm Pressure (mmHG) +----------+--------+--------+--------+-------------------+ [id number]                                           +----------+--------+--------+--------+-------------------+ +---------+--------+---+--------+--+---------+ VertebralPSV cm/s103EDV cm/s13Antegrade +---------+--------+---+--------+--+---------+   Summary: Right Carotid: Velocities in the right ICA are consistent with a 1-39% stenosis. Left Carotid: Velocities in the left ICA are consistent with a 1-39% stenosis. Vertebrals: Bilateral vertebral arteries demonstrate antegrade flow. *See table(s) above for measurements and observations.  Electronically signed by Ruta Hinds MD on 08/15/2020 at 5:03:40 PM.    Final     Procedures Procedures (including critical care time)  Medications Ordered in ED Medications  acetaminophen (TYLENOL) tablet 500 mg (has no administration in time range)  allopurinol (ZYLOPRIM) tablet 200 mg (has no administration in time range)  aspirin EC tablet 81 mg (has no administration in time range)  atorvastatin (LIPITOR) tablet 40 mg (has no administration in time range)  donepezil (ARICEPT) tablet 10 mg (has no administration in time range)  ezetimibe (ZETIA) tablet 10 mg (has no administration in time range)  ferrous sulfate EC tablet 325 mg (has no administration in time range)  folic acid-pyridoxine-cyancobalamin (FOLTX) 2.5-25-2 MG per tablet 1 tablet (has no administration in time range)  furosemide (LASIX) tablet 40 mg (has no administration in time range)  hydroxychloroquine (PLAQUENIL) tablet 200 mg (has no administration in time range)  metoprolol succinate (TOPROL-XL) 24 hr tablet 25 mg (has no administration in time range)  pantoprazole (PROTONIX) EC tablet 40 mg (has no administration in time range)  irbesartan (AVAPRO) tablet 75 mg (has no administration in time range)  insulin aspart (novoLOG) injection 0-15 Units (has no administration in time range)  insulin aspart (novoLOG) injection 0-5 Units (has no administration in time range)    ED Course  I have reviewed the triage vital signs and the  nursing notes.  Pertinent labs & imaging results that were available during my care of the patient were reviewed by me and considered in my medical decision making (see chart for details).  Clinical Course as of Aug 17 2307  Tue Aug 16, 2020  2244 Called SW phone - no answer    [RD]    Clinical Course User Index [RD] Lucrezia Starch, MD   MDM Rules/Calculators/A&P                         86 year old lady presents to ER with concern for SNF placement.  Recent admission for syncope, deconditioning.  Reviewed with transitions of care, from social work.  Recommends repeat PT consult.  Social work will follow and assist with placement.  Patient has no medical complaints at present.  Vitals stable.  We will check some basic labs.  Assuming labs within normal limits, anticipate holding in ER tonight.  Placed orders for home medications as well as sliding scale insulin.  Final Clinical Impression(s) / ED Diagnoses Final diagnoses:  Encounter for social work intervention    Rx / DC Orders ED Discharge Orders    None       Lucrezia Starch, MD 08/23/2020 2308

## 2020-08-16 NOTE — TOC Initial Note (Signed)
Transition of Care [facility]) - Initial/Assessment Note    Patient Details  Name: Maria [name] MRN: [id number] Date of Birth: 07-01-1926  [facility] CM/SW Contact:    Claudine Mouton, LCSW Phone Number: 09/03/2020, 11:17 PM  Clinical Narrative:         CSW received a call from pt's EPD stating pt was inpatient and refused assistance with SNF placement  and was discharged this afternoon (08/11/2020).  EPD states pt states she has no active complaints other than still retaining an inability to care for herself and has changed her mind and is requesting placement.  11:08 PM CSW spoke with pt and confirmed pt's plan to be discharged to SNF to for rehab at discharge from the ED.  CSW provided active listening and validated pt's concerns that she cannot care for herself at home and has no one to come into the home to help her at tyis time and needs rehab to regain strenght after her hospital stay.   CSW was given permission to complete FL-2 and send referrals out to SNF facilities via the hub, per pt's request.  Pt has been living independently prior to being admitted to [facility].  Pt was A&OX4, and presented with a calm and pleasant affect and was appreciative and thanked the CSW.  EDP agrees to order a PT consult and to initiate a rapid COVID test.            Expected Discharge Plan: [place] Barriers to Discharge: Insurance Authorization   Patient Goals and CMS Choice Patient states their goals for this hospitalization and ongoing recovery are:: To regain her strength sufficiently to return home after her SNF stay.      Expected Discharge Plan and Services Expected Discharge Plan: [place] arrangements for the past 2 months: Single Family Home                                    Prior Living Arrangements/Services Living arrangements for the past 2 months: Single Family Home Lives with:: Self Patient language and need  for interpreter reviewed:: No Do you feel safe going back to the place where you live?: No      Need for Family Participation in Patient Care: No (Comment) Care giver support system in place?: No (comment)      Activities of Daily Living      Permission Sought/Granted Permission sought to share information with : Chartered certified accountant granted to share information with : Yes, Verbal Permission Granted              Emotional Assessment Appearance:: Appears younger than stated age   Affect (typically observed): Calm, Adaptable, Accepting, Pleasant Orientation: : Oriented to Self, Oriented to Place, Oriented to  Time, Oriented to Situation   Psych Involvement: No (comment)  Admission diagnosis:  weakness Patient Active Problem List   Diagnosis Date Noted  . Chronic diastolic CHF (congestive heart failure) ([place]) 08/30/2020  . Gastritis 09/06/2020  . Iron deficiency anemia 09/01/2020  . Acute renal failure superimposed on stage 3b chronic kidney disease ([place]) 09/04/2020  . Seronegative rheumatoid arthritis ([place]) 06/02/2020  . Chronic renal disease, stage 3, moderately decreased glomerular filtration rate between 30-59 mL/min/1.73 square meter ([place]) 01/08/2020  . Hypertensive heart and kidney disease with chronic diastolic congestive heart failure and stage 3 chronic kidney disease (  [place]) 01/06/2020  . Primary osteoarthritis of right knee 01/06/2020  . Joint pain 12/03/2019  . Hav (hallux abducto valgus), right 11/04/2019  . Diabetic peripheral vascular disease ([place]) 09/15/2019  . Gout 07/02/2019  . Insulin dependent type 2 diabetes mellitus ([place]) 07/02/2019  . Acute on chronic diastolic CHF (congestive heart failure) ([place]) 03/26/2018  . Acute on chronic renal insufficiency 03/26/2018  . Dyspnea on exertion 02/14/2018  . Palpitations 02/13/2017  . Chest pain 03/25/2015  . Acute diverticulitis 03/04/2013  . Bilateral lower extremity edema 01/23/2013  .  Hyperlipidemia 01/23/2013  . Diverticulitis 01/31/2012  . Diabetes mellitus ([place]) 01/31/2012  . Essential hypertension 01/31/2012  . Obstructive sleep apnea on CPAP 01/31/2012  . History of TIAs 01/31/2012  . Obesity 01/31/2012  . H/O vertigo 01/31/2012   PCP:  Glendale Chard, MD Pharmacy:   [facility] [place] [address] Phone: [phone] Fax: [phone]  [facility] [place], [place] [address] [address] Phone: [phone] Fax: [phone]     Social Determinants of Health (SDOH) Interventions    Readmission Risk Interventions No flowsheet data found.

## 2020-08-16 NOTE — ED Triage Notes (Signed)
Patient BIB [facility] from home.  Patient was just discharged from the 6th floor today, once she got home she realized she should have went to rehab.  Family called the 6th floor and was told to bring patient back to the hospital. Patient refused to go to SNF when she was admitted.   Vitals were 118/64 96-HR 18-RR 98% on room air 97.9-temp

## 2020-08-17 ENCOUNTER — Telehealth: Payer: Medicare Other

## 2020-08-17 LAB — CBC WITH DIFFERENTIAL/PLATELET
Abs Immature Granulocytes: 0.13 10*3/uL — ABNORMAL HIGH (ref 0.00–0.07)
Basophils Absolute: 0 10*3/uL (ref 0.0–0.1)
Basophils Relative: 0 %
Eosinophils Absolute: 0 10*3/uL (ref 0.0–0.5)
Eosinophils Relative: 0 %
HCT: 34.4 % — ABNORMAL LOW (ref 36.0–46.0)
Hemoglobin: 10.4 g/dL — ABNORMAL LOW (ref 12.0–15.0)
Immature Granulocytes: 1 %
Lymphocytes Relative: 8 %
Lymphs Abs: 0.9 10*3/uL (ref 0.7–4.0)
MCH: 28.7 pg (ref 26.0–34.0)
MCHC: 30.2 g/dL (ref 30.0–36.0)
MCV: 94.8 fL (ref 80.0–100.0)
Monocytes Absolute: 1 10*3/uL (ref 0.1–1.0)
Monocytes Relative: 9 %
Neutro Abs: 8.9 10*3/uL — ABNORMAL HIGH (ref 1.7–7.7)
Neutrophils Relative %: 82 %
Platelets: 142 10*3/uL — ABNORMAL LOW (ref 150–400)
RBC: 3.63 MIL/uL — ABNORMAL LOW (ref 3.87–5.11)
RDW: 18.4 % — ABNORMAL HIGH (ref 11.5–15.5)
WBC: 10.9 10*3/uL — ABNORMAL HIGH (ref 4.0–10.5)
nRBC: 0 % (ref 0.0–0.2)

## 2020-08-17 LAB — URINALYSIS, ROUTINE W REFLEX MICROSCOPIC
Bilirubin Urine: NEGATIVE
Glucose, UA: NEGATIVE mg/dL
Ketones, ur: NEGATIVE mg/dL
Nitrite: NEGATIVE
Protein, ur: 100 mg/dL — AB
RBC / HPF: >50 RBC/hpf — ABNORMAL HIGH (ref 0–5)
Specific Gravity, Urine: 1.014 (ref 1.005–1.030)
WBC, UA: >50 WBC/hpf — ABNORMAL HIGH (ref 0–5)
pH: 5 (ref 5.0–8.0)

## 2020-08-17 LAB — RESP PANEL BY RT-PCR (FLU A&B, COVID) ARPGX2
Influenza A by PCR: NEGATIVE
Influenza B by PCR: NEGATIVE
SARS Coronavirus 2 by RT PCR: NEGATIVE

## 2020-08-17 LAB — CBG MONITORING, ED
Glucose-Capillary: 113 mg/dL — ABNORMAL HIGH (ref 70–99)
Glucose-Capillary: 125 mg/dL — ABNORMAL HIGH (ref 70–99)
Glucose-Capillary: 90 mg/dL (ref 70–99)
Glucose-Capillary: 105 mg/dL — ABNORMAL HIGH (ref 70–99)

## 2020-08-17 NOTE — ED Notes (Signed)
Cleaned pt up from watery stool changed purewick

## 2020-08-17 NOTE — [place] FL2 (Signed)
[place] MEDICAID FL2 LEVEL OF CARE SCREENING TOOL     IDENTIFICATION  Patient Name: Maria [name] Birthdate: 1926/04/23 Sex: female Admission Date (Current Location): 09/03/2020  [facility] and [place] Number:  Herbalist and Address:  [facility],  [place] [address], [place]      Provider Number: [id number]  Attending Physician Name and Address:  Default, Provider, MD  Relative Name and Phone Number:  Margy Clarks (Niece) [phone]    Current Level of Care: Hospital Recommended Level of Care: [place] Prior Approval Number:    Date Approved/Denied:   PASRR Number: [id number]  Discharge Plan: SNF    Current Diagnoses: Patient Active Problem List   Diagnosis Date Noted  . Chronic diastolic CHF (congestive heart failure) ([place]) 08/27/2020  . Gastritis 08/29/2020  . Iron deficiency anemia 08/28/2020  . Acute renal failure superimposed on stage 3b chronic kidney disease ([place]) 08/15/2020  . Seronegative rheumatoid arthritis ([place]) 06/02/2020  . Chronic renal disease, stage 3, moderately decreased glomerular filtration rate between 30-59 mL/min/1.73 square meter ([place]) 01/08/2020  . Hypertensive heart and kidney disease with chronic diastolic congestive heart failure and stage 3 chronic kidney disease ([place]) 01/06/2020  . Primary osteoarthritis of right knee 01/06/2020  . Joint pain 12/03/2019  . Hav (hallux abducto valgus), right 11/04/2019  . Diabetic peripheral vascular disease ([place]) 09/15/2019  . Gout 07/02/2019  . Insulin dependent type 2 diabetes mellitus ([place]) 07/02/2019  . Acute on chronic diastolic CHF (congestive heart failure) ([place]) 03/26/2018  . Acute on chronic renal insufficiency 03/26/2018  . Dyspnea on exertion 02/14/2018  . Palpitations 02/13/2017  . Chest pain 03/25/2015  . Acute diverticulitis 03/04/2013  . Bilateral lower extremity edema 01/23/2013  . Hyperlipidemia 01/23/2013  . Diverticulitis  01/31/2012  . Diabetes mellitus ([place]) 01/31/2012  . Essential hypertension 01/31/2012  . Obstructive sleep apnea on CPAP 01/31/2012  . History of TIAs 01/31/2012  . Obesity 01/31/2012  . H/O vertigo 01/31/2012    Orientation RESPIRATION BLADDER Height & Weight     Self, Situation, Place, Time  Normal Continent Weight: 154 lb 5.2 oz (70 kg) Height:  4\' 11"  (149.9 cm)  BEHAVIORAL SYMPTOMS/MOOD NEUROLOGICAL BOWEL NUTRITION STATUS      Continent    AMBULATORY STATUS COMMUNICATION OF NEEDS Skin   Limited Assist Verbally Normal                       Personal Care Assistance Level of Assistance  Bathing, Feeding, Dressing Bathing Assistance: Limited assistance Feeding assistance: Independent Dressing Assistance: Limited assistance     Functional Limitations Info  Sight, Hearing, Speech Sight Info: Adequate Hearing Info: Adequate Speech Info: Adequate    SPECIAL CARE FACTORS FREQUENCY  PT (By licensed PT), OT (By licensed OT)     PT Frequency: 5 times a week OT Frequency: 5 times a week            Contractures      Additional Factors Info  Code Status, Allergies Code Status Info: Full Code Allergies Info: Ace Inhibitors, Betadine (povidone Iodine),Valsartan           Current Medications (08/17/2020):  This is the current hospital active medication list Current Facility-Administered Medications  Medication Dose Route Frequency Provider Last Rate Last Admin  . acetaminophen (TYLENOL) tablet 500 mg  500 mg Oral Q4H PRN Lucrezia Starch, MD   500 mg at 08/17/20 0011  . allopurinol (ZYLOPRIM) tablet 200 mg  200 mg Oral Daily Lucrezia Starch, MD      . Derrill Memo ON 08/24/2020] aspirin EC tablet 81 mg  81 mg Oral Daily Lucrezia Starch, MD      . atorvastatin (LIPITOR) tablet 40 mg  40 mg Oral Daily Lucrezia Starch, MD      . donepezil (ARICEPT) tablet 10 mg  10 mg Oral QPM Lucrezia Starch, MD   10 mg at 08/17/20 0011  . ezetimibe (ZETIA) tablet 10 mg  10  mg Oral QHS Lucrezia Starch, MD      . ferrous sulfate tablet 325 mg  325 mg Oral Daily Dykstra, Ellwood Dense, MD      . folic acid-pyridoxine-cyancobalamin (FOLTX) 2.5-25-2 MG per tablet 1 tablet  1 tablet Oral Daily Lucrezia Starch, MD      . furosemide (LASIX) tablet 40 mg  40 mg Oral Daily Lucrezia Starch, MD      . hydroxychloroquine (PLAQUENIL) tablet 200 mg  200 mg Oral Daily Lucrezia Starch, MD      . insulin aspart (novoLOG) injection 0-15 Units  0-15 Units Subcutaneous TID WC Lucrezia Starch, MD      . insulin aspart (novoLOG) injection 0-5 Units  0-5 Units Subcutaneous QHS Lucrezia Starch, MD      . irbesartan (AVAPRO) tablet 75 mg  75 mg Oral Daily Lucrezia Starch, MD      . metoprolol succinate (TOPROL-XL) 24 hr tablet 25 mg  25 mg Oral Daily Lucrezia Starch, MD      . pantoprazole (PROTONIX) EC tablet 40 mg  40 mg Oral BID AC Lucrezia Starch, MD   40 mg at 08/17/20 6468   Current Outpatient Medications  Medication Sig Dispense Refill  . acetaminophen (TYLENOL) 500 MG tablet Take 1 tablet (500 mg total) by mouth every 4 (four) hours as needed for mild pain or moderate pain (or Fever >/= 101).    Marland Kitchen allopurinol (ZYLOPRIM) 100 MG tablet Take 2 tablets (200 mg total) by mouth daily. 180 tablet 0  . atorvastatin (LIPITOR) 40 MG tablet Take 1 tablet (40 mg total) by mouth daily. Monday, Wednesday, and Friday    . Cholecalciferol (VITAMIN D) 50 MCG (2000 UT) tablet Take 2,000 Units by mouth daily.    . citalopram (CELEXA) 20 MG tablet Take 20 mg by mouth daily.     Marland Kitchen docusate sodium (COLACE) 100 MG capsule Take 100 mg by mouth 2 (two) times daily.    Marland Kitchen donepezil (ARICEPT) 10 MG tablet TAKE 1 TABLET DAILY IN THE EVENING (Patient taking differently: Take 10 mg by mouth at bedtime. ) 90 tablet 3  . ezetimibe (ZETIA) 10 MG tablet TAKE 1 TABLET AT BEDTIME (Patient taking differently: Take 10 mg by mouth daily. ) 90 tablet 3  . ferrous sulfate 325 (65 FE) MG EC tablet  Take 1 tablet (325 mg total) by mouth 2 (two) times daily. (Patient taking differently: Take 325 mg by mouth daily. ) 60 tablet 3  . FOLBIC 2.5-25-2 MG TABS tablet TAKE 1 TABLET DAILY (Patient taking differently: Take 1 tablet by mouth daily. ) 90 tablet 3  . furosemide (LASIX) 40 MG tablet Take 1 tablet (40 mg total) by mouth daily. 90 tablet 3  . hydroxychloroquine (PLAQUENIL) 200 MG tablet Take 1 tablet (200 mg total) by mouth daily. 90 tablet 0  . Menthol-Methyl Salicylate (MUSCLE RUB) 10-15 % CREA Apply 1 application topically as needed for muscle pain.    Marland Kitchen  metoprolol succinate (TOPROL-XL) 25 MG 24 hr tablet TAKE 1 TABLET(25 MG) BY MOUTH DAILY (Patient taking differently: Take 25 mg by mouth daily. ) 90 tablet 0  . pantoprazole (PROTONIX) 40 MG tablet Take 1 tablet (40 mg total) by mouth 2 (two) times daily before a meal. 60 tablet 3  . telmisartan (MICARDIS) 20 MG tablet Take 1 tablet (20 mg total) by mouth daily. 30 tablet 3  . Accu-Chek FastClix Lancets MISC CHECK BLOOD SUGAR BEFORE BREAKFAST AND DINNER 306 each 1  . ACCU-CHEK SMARTVIEW test strip TEST TWICE DAILY BEFORE BREAKFAST AND DINNER 300 strip 5  . [START ON 08/24/2020] aspirin EC 81 MG tablet Take 1 tablet (81 mg total) by mouth daily. 30 tablet 11  . Insulin Pen Needle (BD PEN NEEDLE MICRO U/F) 32G X 6 MM MISC Use as directed 300 each 2     Discharge Medications: Please see discharge summary for a list of discharge medications.  Relevant Imaging Results:  Relevant Lab Results:   Additional Information SSN #:  [id number]  Kaylen Motl C Tarpley-Carter, LCSWA

## 2020-08-17 NOTE — Progress Notes (Addendum)
.  CSW received a call from [employer] at ph: [phone] at [facility] in [facility] stating the patient has been offered a bed and has been accepted and that the pt can arrive on the day the ins auth is received from [place] (if not too late in the day).  The pt's accepting doctor is the SNF MD.   Of note:  TOC CSW/RN CM please call Claiborne Billings [place] at ph: [phone] at [place] to provide her with the insurance auth when it is received from [place], thank you!  CSW will update RN and EDP.  Alphonse Guild. Elonda Giuliano, Latanya Presser, LCAS Clinical Social Worker Ph: [phone]

## 2020-08-17 NOTE — ED Notes (Signed)
Pt CBG 113 

## 2020-08-17 NOTE — ED Provider Notes (Signed)
See patient in signout from Dr. Roslynn Amble, patient is a 86 year old female with recent hospitalization who had recommendations for skilled nursing facility.  She declined and try to go home but unfortunately is not doing well at home and so came back to the hospital for placement.  Lab work has returned with mild elevation of her potassium at 5.4 with hemolysis.  EKG without concerning findings of hyperkalemia.  Awaiting social work disposition.   Deno Etienne, DO 08/17/20 0107

## 2020-08-17 NOTE — Progress Notes (Signed)
TOC CM/CSW faxed [employer] info requested.  Fax was successful.  CSW will continue to follow for dc needs.  Maria [name], MSW, LCSW-A Pronouns:  She, Her, [place] ED Transitions of CareClinical Social Worker [name] [phone]

## 2020-08-17 NOTE — Progress Notes (Signed)
TOC CM/CSW contacted [employer] to began [place].  CSW spoke with [place] [phone].    Reference #:  [id number]  Start Date:  09/08/2020  Fax #:  [phone]  CSW will continue to follow for dc needs.  Silvia Hightower Tarpley-Carter, MSW, LCSW-A Pronouns:  She, Her, Hers                  [place] ED Transitions of CareClinical Social Worker [email or web address] [phone]

## 2020-08-17 NOTE — Evaluation (Signed)
Physical Therapy Evaluation Patient Details Name: Maria [name] MRN: [id number] DOB: [birth year] Today's Date: 08/17/2020   History of Present Illness  86 yo female back to ED with inability to ambulate, weakness, pain. Hx of severe OA R knee, DM, CKD, HF, OSA, syncope.  Clinical Impression  On eval in ED, pt required Mod assist for bed mobility and to stand at bedside. Pt stood for ~15 seconds before needing to sit back down due to weakness, fatigue, and pain. Pt rated R knee pain 9/10 during session. Pt presents with general weakness, decreased activity tolerance, and impaired gait and balance. She is at high risk for falls.  Recommend ST rehab at SNF to improve strength, functional mobility and safety-pt is now agreeable.     Follow Up Recommendations SNF    Equipment Recommendations  None recommended by PT    Recommendations for Other Services       Precautions / Restrictions Precautions Precautions: Sternal Restrictions Weight Bearing Restrictions: No      Mobility  Bed Mobility Overal bed mobility: Needs Assistance Bed Mobility: Supine to Sit;Sit to Supine     Supine to sit: Mod assist;HOB elevated Sit to supine: Mod assist   General bed mobility comments: Assist for trunk and bil LEs. Increased time. Cues for safety, technique.    Transfers Overall transfer level: Needs assistance Equipment used: Rolling walker (2 wheeled) Transfers: Sit to/from Stand Sit to Stand: Mod assist;From elevated surface         General transfer comment: Assist to power up, stabilize, control descent. Cues for safety, technique, hand/LE placement. Pt stood for ~15 seconds before needing to sit down. Pt c/o dizziness.  Ambulation/Gait             General Gait Details: Unable to attempt any steps 2* pain.  Stairs            Wheelchair Mobility    Modified Rankin (Stroke Patients Only)       Balance Overall balance assessment: Needs assistance Sitting-balance  support: Bilateral upper extremity supported;Feet supported Sitting balance-Leahy Scale: Fair     Standing balance support: Bilateral upper extremity supported Standing balance-Leahy Scale: Poor                               Pertinent Vitals/Pain Pain Assessment: 0-10 Pain Score: 9  Pain Location: R knee Pain Descriptors / Indicators: Grimacing;Guarding;Sharp;Sore;Discomfort Pain Intervention(s): Limited activity within patient's tolerance    Home Living Family/patient expects to be discharged to:: Skilled nursing facility Living Arrangements: Alone Available Help at Discharge: Personal care attendant (5 days a week 6-830) Type of Home: Apartment Home Access: Ramped entrance     Home Layout: One level Home Equipment: Bedside commode;Walker - 2 wheels;Walker - 4 wheels;Shower seat      Prior Function Level of Independence: Needs assistance   Gait / Transfers Assistance Needed: uses rollator for household distances up until last few days  ADL's / Homemaking Assistance Needed: Aide assist with bathing but pt able to do toielting and dressing        Hand Dominance        Extremity/Trunk Assessment   Upper Extremity Assessment Upper Extremity Assessment: Generalized weakness    Lower Extremity Assessment Lower Extremity Assessment: Generalized weakness    Cervical / Trunk Assessment Cervical / Trunk Assessment: Normal  Communication   Communication: No difficulties  Cognition Arousal/Alertness: Awake/alert Behavior During Therapy: WFL for tasks assessed/performed Overall  Cognitive Status: Within Functional Limits for tasks assessed                                        General Comments      Exercises     Assessment/Plan    PT Assessment Patient needs continued PT services  PT Problem List Decreased strength;Decreased mobility;Decreased range of motion;Decreased activity tolerance;Decreased balance;Decreased knowledge of use  of DME;Pain       PT Treatment Interventions DME instruction;Gait training;Patient/family education;Therapeutic exercise;Therapeutic activities;Balance training;Functional mobility training    PT Goals (Current goals can be found in the Care Plan section)  Acute Rehab PT Goals Patient Stated Goal: to go to rehab and regain PLOF PT Goal Formulation: With patient Time For Goal Achievement: 08/31/20 Potential to Achieve Goals: Good    Frequency Min 2X/week   Barriers to discharge Decreased caregiver support      Co-evaluation               AM-PAC PT "6 Clicks" Mobility  Outcome Measure Help needed turning from your back to your side while in a flat bed without using bedrails?: A Lot Help needed moving from lying on your back to sitting on the side of a flat bed without using bedrails?: A Lot Help needed moving to and from a bed to a chair (including a wheelchair)?: A Lot Help needed standing up from a chair using your arms (e.g., wheelchair or bedside chair)?: A Lot Help needed to walk in hospital room?: Total Help needed climbing 3-5 steps with a railing? : Total 6 Click Score: 10    End of Session Equipment Utilized During Treatment: Gait belt Activity Tolerance: Patient limited by fatigue;Patient limited by pain Patient left: in bed;with call bell/phone within reach   PT Visit Diagnosis: Other abnormalities of gait and mobility (R26.89);Muscle weakness (generalized) (M62.81);Dizziness and giddiness (R42)    Time: 7106-2694 PT Time Calculation (min) (ACUTE ONLY): 32 min   Charges:   PT Evaluation $PT Eval Moderate Complexity: 1 Mod PT Treatments $Therapeutic Activity: 8-22 mins          Doreatha Massed, PT Acute Rehabilitation  Office: [phone] Pager: [phone]

## 2020-08-17 NOTE — Patient Instructions (Signed)
Visit Information  Goals Addressed            This Visit's Progress   . Pharmacy Care Plan       CARE PLAN ENTRY (see longitudinal plan of care for additional care plan information)  Current Barriers:  . Chronic Disease Management support, education, and care coordination needs related to Hypertension, Hyperlipidemia, Diabetes, and Heart Failure   Hypertension BP Readings from Last 3 Encounters:  06/13/20 112/66  06/07/20 111/67  06/02/20 110/74   . Pharmacist Clinical Goal(s): o Over the next 90 days, patient will work with PharmD and providers to maintain BP goal <130/80 . Current regimen:  o Amlodipine 5mg  1/2 tablet daily o Telmisartan 80mg  daily . Interventions: o Dietary recommendations provided (limit salt intake) o Recommend patient check blood pressure more consistently . Patient self care activities - Over the next 90 days, patient will: o Check BP 1-2 times weekly and if symptomatic, document, and provide at future appointments o Ensure daily salt intake < 2300 mg/day  Hyperlipidemia Lab Results  Component Value Date/Time   LDLCALC 71 01/06/2020 12:03 PM   . Pharmacist Clinical Goal(s): o Over the next 90 days, patient will work with PharmD and providers to maintain LDL goal < 70 . Current regimen:  o Atorvastatin 40mg  three times weekly o Ezetimibe 10mg  daily . Interventions: o Provided dietary and exercise recommendations o Discussed cholesterol levels are great . Patient self care activities - Over the next 90 days, patient will: o Take cholesterol medication as directed o Exercise as able  Diabetes Lab Results  Component Value Date/Time   HGBA1C 6.0 (H) 06/13/2020 10:41 AM   HGBA1C 8.1 (H) 03/07/2020 11:01 AM   . Pharmacist Clinical Goal(s): o Over the next 90 days, patient will work with PharmD and providers to maintain A1c goal <7% . Current regimen:  o Ozempic 0.5 mg once weekly  . Interventions: o Discussed current diabetes treatment  regimen o Reviewed recent blood sugar readings o Assessed for symptoms of low blood sugar, patient denies o Discussed eating small meals/portions when appetite is low o Will eventually decrease insulin as patient's blood sugar comes down after completing prednisone . Patient self care activities - Over the next 90 days, patient will: o Check blood sugar twice daily, document, and provide at future appointments o Contact provider with any episodes of hypoglycemia o Stop taking Toujeo 17 units o Continue taking Ozempic 0.5 mg once a week   Heart Failure . Pharmacist Clinical Goal(s) o Over the next 90 days, patient will work with PharmD and providers to reduce symptoms associated with heart failure . Current regimen:   Furosemide 40mg  twice daily   Hydralazine 25mg  three times daily  Isosorbide mononitrate ER 30mg  daily  Metolazone 2.5mg  every other day prior to morning Lasix dose  Metoprolol Succinate XL 25mg  daily  Potassium 37mEq twice daily . Interventions: o Encouraged patient to exercise as able . Patient self care activities - Over the next 90 days, patient will: o Weigh daily and record to check for fluid buildup o Notify PCP of significant shortness of breath  Medication management . Pharmacist Clinical Goal(s): o Over the next 90 days, patient will work with PharmD and providers to achieve optimal medication adherence . Current pharmacy: [facility] . Interventions o Comprehensive medication review performed. o Continue current medication management strategy o Continue Docusate twice daily and Dulcolax as needed for constipation . Patient self care activities - Over the next  90 days, patient will: o Focus on medication adherence by continued use of pillbox and calendar reminders o Take medications as prescribed o Report any questions or concerns to PharmD and/or provider(s)  Please see past updates related to this goal by clicking on  the "Past Updates" button in the selected goal         The patient verbalized understanding of instructions, educational materials, and care plan provided today and agreed to receive a mailed copy of patient instructions, educational materials, and care plan.   The pharmacy team will reach out to the patient again over the next 60 days.   Maria [name], PharmD Clinical Pharmacist [facility] [phone]  Diabetes Mellitus and [place] care is an important part of your health, especially when you have diabetes. Diabetes may cause you to have problems because of poor blood flow (circulation) to your feet and legs, which can cause your skin to:  Become thinner and drier.  Break more easily.  Heal more slowly.  Peel and crack. You may also have nerve damage (neuropathy) in your legs and feet, causing decreased feeling in them. This means that you may not notice minor injuries to your feet that could lead to more serious problems. Noticing and addressing any potential problems early is the best way to prevent future foot problems. How to care for your feet Foot hygiene  Wash your feet daily with warm water and mild soap. Do not use hot water. Then, pat your feet and the areas between your toes until they are completely dry. Do not soak your feet as this can dry your skin.  Trim your toenails straight across. Do not dig under them or around the cuticle. File the edges of your nails with an emery board or nail file.  Apply a moisturizing lotion or petroleum jelly to the skin on your feet and to dry, brittle toenails. Use lotion that does not contain alcohol and is unscented. Do not apply lotion between your toes. Shoes and socks  Wear clean socks or stockings every day. Make sure they are not too tight. Do not wear knee-high stockings since they may decrease blood flow to your legs.  Wear shoes that fit properly and have enough cushioning. Always look in  your shoes before you put them on to be sure there are no objects inside.  To break in new shoes, wear them for just a few hours a day. This prevents injuries on your feet. Wounds, scrapes, corns, and calluses  Check your feet daily for blisters, cuts, bruises, sores, and redness. If you cannot see the bottom of your feet, use a mirror or ask someone for help.  Do not cut corns or calluses or try to remove them with medicine.  If you find a minor scrape, cut, or break in the skin on your feet, keep it and the skin around it clean and dry. You may clean these areas with mild soap and water. Do not clean the area with peroxide, alcohol, or iodine.  If you have a wound, scrape, corn, or callus on your foot, look at it several times a day to make sure it is healing and not infected. Check for: ? Redness, swelling, or pain. ? Fluid or blood. ? Warmth. ? Pus or a bad smell. General instructions  Do not cross your legs. This may decrease blood flow to your feet.  Do not use heating pads or hot water bottles on your feet. They may  burn your skin. If you have lost feeling in your feet or legs, you may not know this is happening until it is too late.  Protect your feet from hot and cold by wearing shoes, such as at the beach or on hot pavement.  Schedule a complete foot exam at least once a year (annually) or more often if you have foot problems. If you have foot problems, report any cuts, sores, or bruises to your health care provider immediately. Contact a health care provider if:  You have a medical condition that increases your risk of infection and you have any cuts, sores, or bruises on your feet.  You have an injury that is not healing.  You have redness on your legs or feet.  You feel burning or tingling in your legs or feet.  You have pain or cramps in your legs and feet.  Your legs or feet are numb.  Your feet always feel cold.  You have pain around a toenail. Get help right  away if:  You have a wound, scrape, corn, or callus on your foot and: ? You have pain, swelling, or redness that gets worse. ? You have fluid or blood coming from the wound, scrape, corn, or callus. ? Your wound, scrape, corn, or callus feels warm to the touch. ? You have pus or a bad smell coming from the wound, scrape, corn, or callus. ? You have a fever. ? You have a red line going up your leg. Summary  Check your feet every day for cuts, sores, red spots, swelling, and blisters.  Moisturize feet and legs daily.  Wear shoes that fit properly and have enough cushioning.  If you have foot problems, report any cuts, sores, or bruises to your health care provider immediately.  Schedule a complete foot exam at least once a year (annually) or more often if you have foot problems. This information is not intended to replace advice given to you by your health care provider. Make sure you discuss any questions you have with your health care provider. Document Revised: 05/20/2019 Document Reviewed: 09/28/2016 Elsevier Patient Education  [place].

## 2020-08-17 NOTE — ED Notes (Signed)
Cleaned up pt from a large bm changed chuck pads diaper and purewick

## 2020-08-18 ENCOUNTER — Emergency Department (HOSPITAL_COMMUNITY): Payer: Medicare Other

## 2020-08-18 ENCOUNTER — Encounter (HOSPITAL_COMMUNITY): Payer: Self-pay | Admitting: Radiology

## 2020-08-18 ENCOUNTER — Emergency Department (HOSPITAL_COMMUNITY): Payer: Medicare Other | Admitting: Certified Registered Nurse Anesthetist

## 2020-08-18 ENCOUNTER — Inpatient Hospital Stay (HOSPITAL_COMMUNITY): Payer: Medicare Other

## 2020-08-18 ENCOUNTER — Encounter (HOSPITAL_COMMUNITY): Admission: EM | Disposition: E | Payer: Self-pay | Source: Home / Self Care | Attending: Neurology

## 2020-08-18 DIAGNOSIS — N170 Acute kidney failure with tubular necrosis: Secondary | ICD-10-CM | POA: Diagnosis not present

## 2020-08-18 DIAGNOSIS — I639 Cerebral infarction, unspecified: Secondary | ICD-10-CM | POA: Diagnosis present

## 2020-08-18 DIAGNOSIS — I161 Hypertensive emergency: Secondary | ICD-10-CM | POA: Diagnosis present

## 2020-08-18 DIAGNOSIS — E872 Acidosis: Secondary | ICD-10-CM | POA: Diagnosis not present

## 2020-08-18 DIAGNOSIS — I6602 Occlusion and stenosis of left middle cerebral artery: Secondary | ICD-10-CM | POA: Diagnosis not present

## 2020-08-18 DIAGNOSIS — G4733 Obstructive sleep apnea (adult) (pediatric): Secondary | ICD-10-CM | POA: Diagnosis not present

## 2020-08-18 DIAGNOSIS — I5032 Chronic diastolic (congestive) heart failure: Secondary | ICD-10-CM | POA: Diagnosis present

## 2020-08-18 DIAGNOSIS — I6782 Cerebral ischemia: Secondary | ICD-10-CM | POA: Diagnosis not present

## 2020-08-18 DIAGNOSIS — D500 Iron deficiency anemia secondary to blood loss (chronic): Secondary | ICD-10-CM | POA: Diagnosis not present

## 2020-08-18 DIAGNOSIS — R1310 Dysphagia, unspecified: Secondary | ICD-10-CM | POA: Diagnosis not present

## 2020-08-18 DIAGNOSIS — I2510 Atherosclerotic heart disease of native coronary artery without angina pectoris: Secondary | ICD-10-CM | POA: Diagnosis present

## 2020-08-18 DIAGNOSIS — E870 Hyperosmolality and hypernatremia: Secondary | ICD-10-CM | POA: Diagnosis not present

## 2020-08-18 DIAGNOSIS — Z7189 Other specified counseling: Secondary | ICD-10-CM | POA: Diagnosis not present

## 2020-08-18 DIAGNOSIS — Z9989 Dependence on other enabling machines and devices: Secondary | ICD-10-CM | POA: Diagnosis not present

## 2020-08-18 DIAGNOSIS — J9811 Atelectasis: Secondary | ICD-10-CM | POA: Diagnosis not present

## 2020-08-18 DIAGNOSIS — Z4682 Encounter for fitting and adjustment of non-vascular catheter: Secondary | ICD-10-CM | POA: Diagnosis not present

## 2020-08-18 DIAGNOSIS — G8191 Hemiplegia, unspecified affecting right dominant side: Secondary | ICD-10-CM | POA: Diagnosis present

## 2020-08-18 DIAGNOSIS — G319 Degenerative disease of nervous system, unspecified: Secondary | ICD-10-CM | POA: Diagnosis not present

## 2020-08-18 DIAGNOSIS — Z20822 Contact with and (suspected) exposure to covid-19: Secondary | ICD-10-CM | POA: Diagnosis present

## 2020-08-18 DIAGNOSIS — E1110 Type 2 diabetes mellitus with ketoacidosis without coma: Secondary | ICD-10-CM | POA: Diagnosis not present

## 2020-08-18 DIAGNOSIS — N183 Chronic kidney disease, stage 3 unspecified: Secondary | ICD-10-CM | POA: Diagnosis not present

## 2020-08-18 DIAGNOSIS — R4701 Aphasia: Secondary | ICD-10-CM | POA: Diagnosis present

## 2020-08-18 DIAGNOSIS — I129 Hypertensive chronic kidney disease with stage 1 through stage 4 chronic kidney disease, or unspecified chronic kidney disease: Secondary | ICD-10-CM | POA: Diagnosis not present

## 2020-08-18 DIAGNOSIS — Z515 Encounter for palliative care: Secondary | ICD-10-CM | POA: Diagnosis not present

## 2020-08-18 DIAGNOSIS — R29810 Facial weakness: Secondary | ICD-10-CM | POA: Diagnosis not present

## 2020-08-18 DIAGNOSIS — J189 Pneumonia, unspecified organism: Secondary | ICD-10-CM | POA: Diagnosis not present

## 2020-08-18 DIAGNOSIS — I63412 Cerebral infarction due to embolism of left middle cerebral artery: Secondary | ICD-10-CM | POA: Diagnosis not present

## 2020-08-18 DIAGNOSIS — R1312 Dysphagia, oropharyngeal phase: Secondary | ICD-10-CM | POA: Diagnosis not present

## 2020-08-18 DIAGNOSIS — Z66 Do not resuscitate: Secondary | ICD-10-CM | POA: Diagnosis not present

## 2020-08-18 DIAGNOSIS — Z79899 Other long term (current) drug therapy: Secondary | ICD-10-CM | POA: Diagnosis not present

## 2020-08-18 DIAGNOSIS — I616 Nontraumatic intracerebral hemorrhage, multiple localized: Secondary | ICD-10-CM | POA: Diagnosis not present

## 2020-08-18 DIAGNOSIS — G9341 Metabolic encephalopathy: Secondary | ICD-10-CM | POA: Diagnosis present

## 2020-08-18 DIAGNOSIS — N179 Acute kidney failure, unspecified: Secondary | ICD-10-CM | POA: Diagnosis not present

## 2020-08-18 DIAGNOSIS — B370 Candidal stomatitis: Secondary | ICD-10-CM | POA: Diagnosis present

## 2020-08-18 DIAGNOSIS — N9089 Other specified noninflammatory disorders of vulva and perineum: Secondary | ICD-10-CM | POA: Diagnosis not present

## 2020-08-18 DIAGNOSIS — D638 Anemia in other chronic diseases classified elsewhere: Secondary | ICD-10-CM | POA: Diagnosis not present

## 2020-08-18 DIAGNOSIS — Z4659 Encounter for fitting and adjustment of other gastrointestinal appliance and device: Secondary | ICD-10-CM | POA: Diagnosis not present

## 2020-08-18 DIAGNOSIS — R29716 NIHSS score 16: Secondary | ICD-10-CM | POA: Diagnosis present

## 2020-08-18 DIAGNOSIS — X58XXXA Exposure to other specified factors, initial encounter: Secondary | ICD-10-CM | POA: Diagnosis not present

## 2020-08-18 DIAGNOSIS — Z794 Long term (current) use of insulin: Secondary | ICD-10-CM | POA: Diagnosis not present

## 2020-08-18 DIAGNOSIS — Z7982 Long term (current) use of aspirin: Secondary | ICD-10-CM | POA: Diagnosis not present

## 2020-08-18 DIAGNOSIS — I130 Hypertensive heart and chronic kidney disease with heart failure and stage 1 through stage 4 chronic kidney disease, or unspecified chronic kidney disease: Secondary | ICD-10-CM | POA: Diagnosis present

## 2020-08-18 DIAGNOSIS — I421 Obstructive hypertrophic cardiomyopathy: Secondary | ICD-10-CM | POA: Diagnosis present

## 2020-08-18 DIAGNOSIS — Z8719 Personal history of other diseases of the digestive system: Secondary | ICD-10-CM | POA: Diagnosis not present

## 2020-08-18 DIAGNOSIS — Z90711 Acquired absence of uterus with remaining cervical stump: Secondary | ICD-10-CM | POA: Diagnosis not present

## 2020-08-18 DIAGNOSIS — N390 Urinary tract infection, site not specified: Secondary | ICD-10-CM | POA: Diagnosis not present

## 2020-08-18 DIAGNOSIS — E785 Hyperlipidemia, unspecified: Secondary | ICD-10-CM | POA: Diagnosis present

## 2020-08-18 HISTORY — PX: RADIOLOGY WITH ANESTHESIA: SHX6223

## 2020-08-18 HISTORY — PX: IR PERCUTANEOUS ART THROMBECTOMY/INFUSION INTRACRANIAL INC DIAG ANGIO: IMG6087

## 2020-08-18 HISTORY — PX: IR ANGIO VERTEBRAL SEL VERTEBRAL UNI L MOD SED: IMG5367

## 2020-08-18 LAB — I-STAT CHEM 8, ED
BUN: 27 mg/dL — ABNORMAL HIGH (ref 8–23)
Calcium, Ion: 1.21 mmol/L (ref 1.15–1.40)
Chloride: 101 mmol/L (ref 98–111)
Creatinine, Ser: 1.3 mg/dL — ABNORMAL HIGH (ref 0.44–1.00)
Glucose, Bld: 112 mg/dL — ABNORMAL HIGH (ref 70–99)
HCT: 33 % — ABNORMAL LOW (ref 36.0–46.0)
Hemoglobin: 11.2 g/dL — ABNORMAL LOW (ref 12.0–15.0)
Potassium: 3.6 mmol/L (ref 3.5–5.1)
Sodium: 139 mmol/L (ref 135–145)
TCO2: 24 mmol/L (ref 22–32)

## 2020-08-18 LAB — COMPREHENSIVE METABOLIC PANEL
ALT: 28 U/L (ref 0–44)
AST: 37 U/L (ref 15–41)
Albumin: 2.6 g/dL — ABNORMAL LOW (ref 3.5–5.0)
Alkaline Phosphatase: 72 U/L (ref 38–126)
Anion gap: 13 (ref 5–15)
BUN: 30 mg/dL — ABNORMAL HIGH (ref 8–23)
CO2: 24 mmol/L (ref 22–32)
Calcium: 8.9 mg/dL (ref 8.9–10.3)
Chloride: 102 mmol/L (ref 98–111)
Creatinine, Ser: 1.28 mg/dL — ABNORMAL HIGH (ref 0.44–1.00)
GFR, Estimated: 39 mL/min — ABNORMAL LOW (ref >60)
Glucose, Bld: 115 mg/dL — ABNORMAL HIGH (ref 70–99)
Potassium: 3.6 mmol/L (ref 3.5–5.1)
Sodium: 139 mmol/L (ref 135–145)
Total Bilirubin: 1.1 mg/dL (ref 0.3–1.2)
Total Protein: 6.2 g/dL — ABNORMAL LOW (ref 6.5–8.1)

## 2020-08-18 LAB — ETHANOL: Alcohol, Ethyl (B): <10 mg/dL (ref <10)

## 2020-08-18 LAB — DIFFERENTIAL
Abs Immature Granulocytes: 0.05 10*3/uL (ref 0.00–0.07)
Basophils Absolute: 0 10*3/uL (ref 0.0–0.1)
Basophils Relative: 0 %
Eosinophils Absolute: 0.1 10*3/uL (ref 0.0–0.5)
Eosinophils Relative: 1 %
Immature Granulocytes: 1 %
Lymphocytes Relative: 11 %
Lymphs Abs: 1 10*3/uL (ref 0.7–4.0)
Monocytes Absolute: 0.6 10*3/uL (ref 0.1–1.0)
Monocytes Relative: 7 %
Neutro Abs: 7.5 10*3/uL (ref 1.7–7.7)
Neutrophils Relative %: 80 %

## 2020-08-18 LAB — CBG MONITORING, ED
Glucose-Capillary: 85 mg/dL (ref 70–99)
Glucose-Capillary: 93 mg/dL (ref 70–99)

## 2020-08-18 LAB — CBC
HCT: 32.8 % — ABNORMAL LOW (ref 36.0–46.0)
Hemoglobin: 10.2 g/dL — ABNORMAL LOW (ref 12.0–15.0)
MCH: 28.3 pg (ref 26.0–34.0)
MCHC: 31.1 g/dL (ref 30.0–36.0)
MCV: 90.9 fL (ref 80.0–100.0)
Platelets: 196 10*3/uL (ref 150–400)
RBC: 3.61 MIL/uL — ABNORMAL LOW (ref 3.87–5.11)
RDW: 17.9 % — ABNORMAL HIGH (ref 11.5–15.5)
WBC: 9.2 10*3/uL (ref 4.0–10.5)
nRBC: 0 % (ref 0.0–0.2)

## 2020-08-18 LAB — RAPID URINE DRUG SCREEN, HOSP PERFORMED
Amphetamines: NOT DETECTED
Barbiturates: NOT DETECTED
Benzodiazepines: NOT DETECTED
Cocaine: NOT DETECTED
Opiates: NOT DETECTED
Tetrahydrocannabinol: NOT DETECTED

## 2020-08-18 LAB — PROTIME-INR
INR: 0.9 (ref 0.8–1.2)
Prothrombin Time: 12.1 seconds (ref 11.4–15.2)

## 2020-08-18 LAB — APTT: aPTT: 34 seconds (ref 24–36)

## 2020-08-18 LAB — GLUCOSE, CAPILLARY: Glucose-Capillary: 106 mg/dL — ABNORMAL HIGH (ref 70–99)

## 2020-08-18 SURGERY — IR WITH ANESTHESIA
Anesthesia: General

## 2020-08-18 MED ORDER — SUCCINYLCHOLINE CHLORIDE 200 MG/10ML IV SOSY
PREFILLED_SYRINGE | INTRAVENOUS | Status: DC | PRN
  Administered 2020-08-18: 120 mg via INTRAVENOUS

## 2020-08-18 MED ORDER — LABETALOL HCL 5 MG/ML IV SOLN: 10.0000 mg | Freq: Once | INTRAVENOUS | Status: DC | PRN

## 2020-08-18 MED ORDER — IOHEXOL 300 MG/ML  SOLN
150.0000 mL | Freq: Once | INTRAMUSCULAR | Status: AC | PRN
  Administered 2020-08-18: 75 mL via INTRA_ARTERIAL

## 2020-08-18 MED ORDER — SODIUM CHLORIDE 0.9 % IV SOLN: INTRAVENOUS | Status: DC

## 2020-08-18 MED ORDER — STUDY - TIMELESS - TENECTEPLASE OR PLACEBO IV BOLUS (PI-SETHI)
0.2500 mg/kg | Freq: Once | INTRAVENOUS | Status: AC
  Administered 2020-08-18: 17.5 mg via INTRAVENOUS
  Filled 2020-08-18: qty 3.5

## 2020-08-18 MED ORDER — EPTIFIBATIDE 20 MG/10ML IV SOLN
INTRAVENOUS | Status: AC
  Filled 2020-08-18: qty 10

## 2020-08-18 MED ORDER — NICARDIPINE HCL IN NACL 20-0.86 MG/200ML-% IV SOLN: 0.0000 mg/h | INTRAVENOUS | Status: DC | PRN

## 2020-08-18 MED ORDER — PROPOFOL 10 MG/ML IV BOLUS
INTRAVENOUS | Status: DC | PRN
  Administered 2020-08-18: 60 mg via INTRAVENOUS

## 2020-08-18 MED ORDER — ONDANSETRON HCL 4 MG/2ML IJ SOLN
INTRAMUSCULAR | Status: DC | PRN
  Administered 2020-08-18: 4 mg via INTRAVENOUS

## 2020-08-18 MED ORDER — IOHEXOL 350 MG/ML SOLN
90.0000 mL | Freq: Once | INTRAVENOUS | Status: AC | PRN
  Administered 2020-08-18: 90 mL via INTRAVENOUS

## 2020-08-18 MED ORDER — ACETAMINOPHEN 650 MG RE SUPP: 650.0000 mg | RECTAL | Status: DC | PRN

## 2020-08-18 MED ORDER — CLEVIDIPINE BUTYRATE 0.5 MG/ML IV EMUL: 0.0000 mg/h | INTRAVENOUS | Status: DC

## 2020-08-18 MED ORDER — SODIUM CHLORIDE 0.9 % IV SOLN: 50.0000 mL/h | INTRAVENOUS | Status: DC

## 2020-08-18 MED ORDER — LIDOCAINE 2% (20 MG/ML) 5 ML SYRINGE
INTRAMUSCULAR | Status: DC | PRN
  Administered 2020-08-18: 40 mg via INTRAVENOUS

## 2020-08-18 MED ORDER — SODIUM CHLORIDE 0.9 % IV SOLN
1.0000 g | Freq: Once | INTRAVENOUS | Status: AC
  Administered 2020-08-18: 1 g via INTRAVENOUS
  Filled 2020-08-18: qty 10

## 2020-08-18 MED ORDER — NITROGLYCERIN 0.2 MG/ML ON CALL CATH LAB
INTRAVENOUS | Status: DC | PRN
  Administered 2020-08-18: 40 ug via INTRAVENOUS
  Administered 2020-08-18 (×2): 20 ug via INTRAVENOUS
  Administered 2020-08-18: 40 ug via INTRAVENOUS

## 2020-08-18 MED ORDER — PANTOPRAZOLE SODIUM 40 MG IV SOLR
40.0000 mg | Freq: Every day | INTRAVENOUS | Status: DC
  Administered 2020-08-18 – 2020-08-19 (×2): 40 mg via INTRAVENOUS
  Filled 2020-08-18 (×2): qty 40

## 2020-08-18 MED ORDER — ASPIRIN 81 MG PO CHEW
CHEWABLE_TABLET | ORAL | Status: AC
  Filled 2020-08-18: qty 1

## 2020-08-18 MED ORDER — IOHEXOL 300 MG/ML  SOLN: 100.0000 mL | Freq: Once | INTRAMUSCULAR | Status: DC | PRN

## 2020-08-18 MED ORDER — FENTANYL CITRATE (PF) 100 MCG/2ML IJ SOLN
INTRAMUSCULAR | Status: DC | PRN
Start: 2020-08-18 — End: 2020-08-18
  Administered 2020-08-18: 25 ug via INTRAVENOUS

## 2020-08-18 MED ORDER — SODIUM CHLORIDE (PF) 0.9 % IJ SOLN
INTRAMUSCULAR | Status: AC
  Administered 2020-08-18: 10 mL
  Filled 2020-08-18: qty 50

## 2020-08-18 MED ORDER — CANGRELOR TETRASODIUM 50 MG IV SOLR
INTRAVENOUS | Status: AC
  Filled 2020-08-18: qty 50

## 2020-08-18 MED ORDER — ACETAMINOPHEN 325 MG PO TABS: 650.0000 mg | ORAL_TABLET | ORAL | Status: DC | PRN

## 2020-08-18 MED ORDER — FENTANYL CITRATE (PF) 100 MCG/2ML IJ SOLN
INTRAMUSCULAR | Status: AC
  Filled 2020-08-18: qty 2

## 2020-08-18 MED ORDER — LACTATED RINGERS IV SOLN: INTRAVENOUS | Status: DC | PRN

## 2020-08-18 MED ORDER — IOHEXOL 240 MG/ML SOLN
INTRAMUSCULAR | Status: AC
  Filled 2020-08-18: qty 100

## 2020-08-18 MED ORDER — TIROFIBAN HCL IN NACL 5-0.9 MG/100ML-% IV SOLN
INTRAVENOUS | Status: AC
  Filled 2020-08-18: qty 100

## 2020-08-18 MED ORDER — CLOPIDOGREL BISULFATE 300 MG PO TABS
ORAL_TABLET | ORAL | Status: AC
  Filled 2020-08-18: qty 1

## 2020-08-18 MED ORDER — PHENYLEPHRINE HCL-NACL 10-0.9 MG/250ML-% IV SOLN
INTRAVENOUS | Status: DC | PRN
  Administered 2020-08-18: 40 ug/min via INTRAVENOUS

## 2020-08-18 MED ORDER — SUGAMMADEX SODIUM 200 MG/2ML IV SOLN
INTRAVENOUS | Status: DC | PRN
  Administered 2020-08-18: 200 mg via INTRAVENOUS

## 2020-08-18 MED ORDER — ROCURONIUM BROMIDE 10 MG/ML (PF) SYRINGE
PREFILLED_SYRINGE | INTRAVENOUS | Status: DC | PRN
  Administered 2020-08-18: 60 mg via INTRAVENOUS

## 2020-08-18 MED ORDER — IOHEXOL 300 MG/ML  SOLN
50.0000 mL | Freq: Once | INTRAMUSCULAR | Status: AC | PRN
  Administered 2020-08-18: 50 mL via INTRA_ARTERIAL

## 2020-08-18 MED ORDER — CHLORHEXIDINE GLUCONATE CLOTH 2 % EX PADS
6.0000 | MEDICATED_PAD | Freq: Every day | CUTANEOUS | Status: DC
  Administered 2020-08-18 – 2020-09-10 (×22): 6 via TOPICAL

## 2020-08-18 MED ORDER — ACETAMINOPHEN 160 MG/5ML PO SOLN: 650.0000 mg | ORAL | Status: DC | PRN

## 2020-08-18 MED ORDER — STROKE: EARLY STAGES OF RECOVERY BOOK: Freq: Once | Status: DC

## 2020-08-18 MED ORDER — PHENYLEPHRINE 40 MCG/ML (10ML) SYRINGE FOR IV PUSH (FOR BLOOD PRESSURE SUPPORT)
PREFILLED_SYRINGE | INTRAVENOUS | Status: DC | PRN
  Administered 2020-08-18 (×2): 80 ug via INTRAVENOUS

## 2020-08-18 MED ORDER — TICAGRELOR 90 MG PO TABS
ORAL_TABLET | ORAL | Status: AC
  Filled 2020-08-18: qty 2

## 2020-08-18 MED ORDER — NITROGLYCERIN 1 MG/10 ML FOR IR/CATH LAB
INTRA_ARTERIAL | Status: AC
  Filled 2020-08-18: qty 10

## 2020-08-18 MED ORDER — VERAPAMIL HCL 2.5 MG/ML IV SOLN
INTRAVENOUS | Status: AC
  Filled 2020-08-18: qty 2

## 2020-08-18 MED ORDER — CLEVIDIPINE BUTYRATE 0.5 MG/ML IV EMUL
0.0000 mg/h | INTRAVENOUS | Status: DC
  Administered 2020-08-18: 2 mg/h via INTRAVENOUS

## 2020-08-18 MED ORDER — STROKE: EARLY STAGES OF RECOVERY BOOK: Freq: Once | Status: AC

## 2020-08-18 NOTE — ED Notes (Signed)
[facility] at bedside to transport pt to  for IR

## 2020-08-18 NOTE — Anesthesia Procedure Notes (Signed)
Arterial Line Insertion Start/End12/06/2020 3:45 PM, 08/25/2020 3:48 PM Performed by: Effie Berkshire, MD, anesthesiologist  Patient location: Pre-op. Preanesthetic checklist: patient identified, IV checked, site marked, risks and benefits discussed, surgical consent, monitors and equipment checked, pre-op evaluation, timeout performed and anesthesia consent Lidocaine 1% used for infiltration Left, radial was placed Catheter size: 20 Fr Hand hygiene performed  and maximum sterile barriers used   Attempts: 1 Procedure performed without using ultrasound guided technique. Following insertion, dressing applied. Post procedure assessment: normal and unchanged

## 2020-08-18 NOTE — Code Documentation (Addendum)
Stroke Response Nurse Documentation Code Documentation  Maria [name] is a 86 y.o. female arriving to [facility] ED via [name] on 12/9 with past medical hx of Diabetes, Chronic Kidney Disease, HTN . Code stroke was activated by Elvina Sidle after patient was noted to have right sided weakness and unable to speak. Pt arrived to [name] Tuesday night at 1758 for SNF placement. Pt had been sent home per her wishes and not done well. During her stay, her LKW at 20. At 0700, report was given that the patient was not oreinted and babbling. Nurse Tech who worked with patient the day before noted the change from yesterday 12/8 and reported to the nurse. Code Stroke activated.   Stroke team at the bedside on patient arrival. Patient taken directly to IR Suite and placed on the table. Report given to CRNA and IR RN. NIHSS 17, see documentation for details and code stroke times. Patient with disoriented, not following commands, left gaze preference , right facial droop, right arm weakness, right leg weakness, right decreased sensation, Expressive aphasia  and dysarthria  on exam. Patient to get admitted to [place] after procedure  [name]  Stroke Response RN

## 2020-08-18 NOTE — Sedation Documentation (Signed)
Upon arrival on 4N at 1754, assessed pt groin with 4NRN and a hematoma was noted in the right groin. The right groin was more firm than the left groin and while assessing the dressing the pt began to bleed from the right groin. Pressure was immediately applied by this RN and held until 1800 when PACU RN took over.

## 2020-08-18 NOTE — Consult Note (Addendum)
[facility] TeleNeurology Consult Services   Date of Service:   08/14/2020 12:42:20  Diagnosis:     .  I63.9 - Cerebrovascular accident (CVA), unspecified mechanism ([place])  Impression: Patient with right sided weakness and aphasia. Unclear onset but last known normal was last night at either 19:00 or 23:00. She is not a thrombolytics candidate. Will get a CTA/P to evaluate for a large vessel occlusion.  Metrics: Last Known Well: 08/17/2020 19:00:00 [facility] Notification Time: 08/27/2020 12:41:34 Stamp Time: 08/17/2020 12:42:20 Time First Login Attempt: 08/20/2020 12:46:10 Symptoms: aphasia and right sided weakness. NIHSS Start Assessment Time: 09/01/2020 12:48:19 Patient is not a candidate for Thrombolytic. Thrombolytic Medical Decision: 09/07/2020 12:57:33 Patient was not deemed candidate for Thrombolytic because of following reasons: Last Well Known Above 4.5 Hours.  CT head showed no acute hemorrhage or acute core infarct.  Advanced Imaging: CTA Head and Neck ordered  CTP ordered   Our recommendations are outlined below.  Recommendations:     .  Activate Stroke Protocol Admission/Order Set     .  Stroke/Telemetry Floor     .  Neuro Checks     .  Bedside Swallow Eval     .  DVT Prophylaxis     .  IV Fluids, Normal Saline     .  Head of Bed 30 Degrees     .  Euglycemia and Avoid Hyperthermia (PRN Acetaminophen)     .  Initiate Aspirin 325 MG Daily  Routine Consultation with [facility] for Follow up Care  Sign Out:     .  Discussed with Rapid Response Team    ------------------------------------------------------------------------------  History of Present Illness: Patient is a 86 year old Female.  Inpatient stroke alert was called for symptoms of aphasia and right sided weakness.  86 yo F with history of stroke who presented for SNF placement as she was recently discharged as a 2 person assist and was unable to care for  herself, she now has slurred speech and right sided weakness. Symptoms were noticed at 12:03 to be different from her baseline. Patient was seen at 7:30 this morning and was in this same state but it was thought to be her baseline at that time. Last known normal was 19:00 last night.   Past Medical History:     . Hypertension     . Diabetes Mellitus     . Hyperlipidemia     . Stroke     . There is NO history of Atrial Fibrillation     . There is NO history of Coronary Artery Disease  Anticoagulant use:  No  Antiplatelet use: Yes aspirin 81mg   Allergies:  Reviewed    Examination: BP(148/73), Pulse(87), Blood Glucose(112) 1A: Level of Consciousness - Alert; keenly responsive + 0 1B: Ask Month and Age - Could Not Answer Either Question Correctly + 2 1C: Blink Eyes & Squeeze Hands - Performs 1 Task + 1 2: Test Horizontal Extraocular Movements - Normal + 0 3: Test Visual Fields - No Visual Loss + 0 4: Test Facial Palsy (Use Grimace if Obtunded) - Partial paralysis (lower face) + 2 5A: Test Left Arm Motor Drift - No Drift for 10 Seconds + 0 5B: Test Right Arm Motor Drift - Some Effort Against Gravity + 2 6A: Test Left Leg Motor Drift - Some Effort Against Gravity + 2 6B: Test Right Leg Motor Drift - No Effort Against Gravity + 3 7: Test Limb Ataxia (FNF/Heel-Shin) - Does Not Understand + 0  8: Test Sensation - Normal; No sensory loss + 0 9: Test Language/Aphasia - Severe Aphasia: Fragmentary Expression, Inference Needed, Cannot Identify Materials + 2 10: Test Dysarthria - Severe Dysarthria: Unintelligble Slurring or Out of Proportion to Aphasia + 2 11: Test Extinction/Inattention - No abnormality + 0  NIHSS Score: 16  Pre-Morbid Modified Rankin Scale: Unable to assess   Patient/Family was informed the Neurology Consult would occur via TeleHealth consult by way of interactive audio and video telecommunications and consented to receiving care in this manner.   Patient is being  evaluated for possible acute neurologic impairment and high probability of imminent or life-threatening deterioration. I spent total of 30 minutes providing care to this patient, including time for face to face visit via telemedicine, review of medical records, imaging studies and discussion of findings with providers, the patient and/or family.   Dr Carolin Sicks   [facility] [phone]  Case [id number]  Addendum:  CTA shows a L M2 occlusion and perfusion with mismatch of 80ml. I was able to reach her granddaughter who reports her being able to care for herself a few days ago prior to the syncopal episode with only minimal assistance from her granddaughter. She could feed, dress herself and walk independently then. Her granddaughter would not feel comfortable leaving her unsupervised for a period of time but did not have specific things she couldn't do. Would consider her an mRS of 2 based on this. Discussed with neurology and NIR at [facility] and decision was made to transfer her for evaluation for thrombectomy.  Carolin Sicks, DO [facility]

## 2020-08-18 NOTE — Progress Notes (Signed)
TOC CM/CSW contacted [employer] in regards to auth #.  Lisa/[employer] stated [name] was approved for 5 days with a start date of 09/01/2020, and review date is 08/22/2020 for facility [facility].  Start Date:  08/19/2020  Review Date:  08/22/2020  CSW will continue to follow for dc needs.  Maria [name], MSW, LCSW-A Pronouns:  She, Her, Hers                  Maria [name] ED Transitions of CareClinical Social Worker [name] [phone]

## 2020-08-18 NOTE — H&P (Signed)
Neurology H&P  CC: Right-sided weakness  History is obtained from: Chart review  HPI: Maria [name] is a 86 y.o. female with a history of previous stroke, hypertension, hyperlipidemia who was recently admitted after syncopal episode with associated dizziness and nausea.  She was found to have significant anemia with gastritis and erosions and was started on PPI.  Was felt to be a combination of overdiuresis and anemia.  She had become physically deconditioned while inpatient, and therapy had recommended skilled nursing facility for rehab, but the patient refused wanting to go home instead.  Unfortunately, when she got home she realized that she was not quite able to manage.  She was last definitely normal yesterday evening at shift change.  When the tech went to take her lunch, it was noted that she was significantly different than yesterday, with right-sided weakness and aphasia.  At this point a code stroke was activated and it was identified that the patient had an M2 occlusion.  CT perfusion was suggestive of only 19 cc of ischemia, all of which was penumbra.  CT angio revealed an M2 occlusion.  Her exam was suggestive of a large area of ischemia then what was seen on the CT perfusion, more consistent with what would be expected from a proximal M2 occlusion.  After discussion with family, it was determined that her premorbid MRS was two, and therefore mechanical thrombectomy was offered and the patient was taken.   LKW: 1900 12/8 tpa given?: She was enrolled in the time of study and received either placebo or tenecteplase.  Will manage as if she received tenecteplase. IR Thrombectomy?  Went for intervention Modified Rankin Scale: 2-Slight disability-UNABLE to perform all activities but does not need assistance NIHSS: 16   ROS:  Unable to obtain due to altered mental status.   Past Medical History:  Diagnosis Date  . Anxiety   . Arthritis   . Blood transfusion   . Depression   .  Diabetes mellitus   . Edema   . Heart murmur   . Hyperlipidemia   . Hypertension   . Sleep apnea    cpap  . Stroke ([place])    3 x tias  . TIA (transient ischemic attack)      Family History  Problem Relation Age of Onset  . Healthy Mother   . Prostate cancer Father   . Cervical cancer Granddaughter   . Heart Problems Son   . Prostate cancer Son   . Neuropathy Neg Hx      Social History:  reports that she has never smoked. She has never used smokeless tobacco. She reports that she does not drink alcohol and does not use drugs.   Prior to Admission medications   Medication Sig Start Date End Date Taking? Authorizing Provider  acetaminophen (TYLENOL) 500 MG tablet Take 1 tablet (500 mg total) by mouth every 4 (four) hours as needed for mild pain or moderate pain (or Fever >/= 101). 08/27/2020  Yes Dwyane Dee, MD  allopurinol (ZYLOPRIM) 100 MG tablet Take 2 tablets (200 mg total) by mouth daily. 12/01/19  Yes Deveshwar, Abel Presto, MD  atorvastatin (LIPITOR) 40 MG tablet Take 1 tablet (40 mg total) by mouth daily. Monday, Wednesday, and Friday 09/07/2020  Yes Dwyane Dee, MD  Cholecalciferol (VITAMIN D) 50 MCG (2000 UT) tablet Take 2,000 Units by mouth daily.   Yes [provider]  citalopram (CELEXA) 20 MG tablet Take 20 mg by mouth daily.    Yes [provider]  docusate sodium (COLACE) 100 MG capsule Take 100 mg by mouth 2 (two) times daily.   Yes [provider]  donepezil (ARICEPT) 10 MG tablet TAKE 1 TABLET DAILY IN THE EVENING Patient taking differently: Take 10 mg by mouth at bedtime.  11/24/18  Yes Glendale Chard, MD  ezetimibe (ZETIA) 10 MG tablet TAKE 1 TABLET AT BEDTIME Patient taking differently: Take 10 mg by mouth daily.  03/29/20  Yes Lorretta Harp, MD  ferrous sulfate 325 (65 FE) MG EC tablet Take 1 tablet (325 mg total) by mouth 2 (two) times daily. Patient taking differently: Take 325 mg by mouth daily.  04/26/20 04/26/21 Yes [provider]  FOLBIC 2.5-25-2 MG TABS tablet TAKE 1 TABLET DAILY Patient taking differently: Take 1 tablet by mouth daily.  01/26/20  Yes Lorretta Harp, MD  furosemide (LASIX) 40 MG tablet Take 1 tablet (40 mg total) by mouth daily. 08/25/2020  Yes Dwyane Dee, MD  hydroxychloroquine (PLAQUENIL) 200 MG tablet Take 1 tablet (200 mg total) by mouth daily. 04/04/20  Yes Ofilia Neas, PA-C  Menthol-Methyl Salicylate (MUSCLE RUB) 10-15 % CREA Apply 1 application topically as needed for muscle pain.   Yes [provider]  metoprolol succinate (TOPROL-XL) 25 MG 24 hr tablet TAKE 1 TABLET(25 MG) BY MOUTH DAILY Patient taking differently: Take 25 mg by mouth daily.  01/07/20  Yes Kilroy, Luke K, PA-C  pantoprazole (PROTONIX) 40 MG tablet Take 1 tablet (40 mg total) by mouth 2 (two) times daily before a meal. 09/08/2020  Yes Dwyane Dee, MD  telmisartan (MICARDIS) 20 MG tablet Take 1 tablet (20 mg total) by mouth daily. 08/14/2020  Yes Dwyane Dee, MD  Accu-Chek FastClix Lancets MISC CHECK BLOOD SUGAR BEFORE BREAKFAST AND DINNER 03/07/20   Glendale Chard, MD  ACCU-CHEK SMARTVIEW test strip TEST TWICE DAILY BEFORE BREAKFAST AND DINNER 10/05/19   Glendale Chard, MD  aspirin EC 81 MG tablet Take 1 tablet (81 mg total) by mouth daily. 08/24/20   Dwyane Dee, MD  Insulin Pen Needle (BD PEN NEEDLE MICRO U/F) 32G X 6 MM MISC Use as directed 12/18/18   Glendale Chard, MD     Exam: Current vital signs: BP 130/65   Pulse 80   Temp (!) 96.9 F (36.1 C)   Resp 13   Ht 4\' 11"  (1.499 m)   Wt 70 kg   SpO2 100%   BMI 31.17 kg/m    Physical Exam  Constitutional: Appears well-developed and well-nourished.  Psych: Affect appropriate to situation Eyes: No scleral injection HENT: No OP obstrucion Head: Normocephalic.  Cardiovascular: Normal rate and regular rhythm.  Respiratory: Effort normal and breath sounds normal to anterior ascultation GI: Soft.  No distension. There is no tenderness.   Skin: WDI  Neuro: Mental Status: She is densely aphasic, though she does give the numbers when testing visual fields, the responses are unreliable, and most of her speech is incomprehensible, but she is able to follow commands. Cranial Nerves: II: She blinks to threat bilaterally pupils are equal, round, and reactive to light.   III,IV, VI: She appears to have a mild left gaze preference, though she does cross midline to the right VII: Facial movement with mild right facial weakness.  Motor: Tone is normal. Bulk is normal.  She has good strength on the left, she is able to attempt to lift her right arm, but it falls rapidly back to the bed.  She has little movement of her  right lower extremity Sensory: Sensation is diminished on the right Cerebellar: She does not perform, but no clear ataxia with moving the left   I have reviewed labs in epic and the pertinent results are: Creatinine 1.28 Hemoglobin stable at 10.2  I have reviewed the images obtained: CT/CTA-left M2 occlusion.  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   Secondary Diagnosis: Hypertension Emergency (SBP > 180 or DBP > 120 & end organ damage) and Type 2 diabetes mellitus w/o complications   Impression: 86 year old female with left M2 occlusion going for emergent thrombectomy.  Dr. Leonie Man has also approached family about enrolling in the timeless trial and she has been enrolled.  Plan: - HgbA1c, fasting lipid panel - MRI  of the brain without contrast - Frequent neuro checks - Echocardiogram - Prophylactic therapy-according to study - Risk factor modification - Telemetry monitoring - PT consult, OT consult, Speech consult -permissive hypertension, will hold antihypertensives. - Stroke team to  follow  Gout Continue allopurinol  Dementia Continue Aricept  Hyperlipidemia Continue Lipitor  DM Continue insulin SSI  Gastritis Continue PPI    This patient is critically ill and at significant risk of neurological worsening, death and care requires constant monitoring of vital signs, hemodynamics,respiratory and cardiac monitoring, neurological assessment, discussion with family, other specialists and medical decision making of high complexity. I spent 45 minutes of neurocritical care time  in the care of  this patient. This was time spent independent of any time provided by nurse practitioner or PA.  Roland Rack, MD [facility] [phone]  If 7pm- 7am, please page neurology on call as listed in [place].

## 2020-08-18 NOTE — Progress Notes (Signed)
Pt remains intubated and under the care of anesthesia at this time. Sheath pulled at 1710, IR tech holding pressure at this time.

## 2020-08-18 NOTE — Anesthesia Procedure Notes (Signed)
Procedure Name: Intubation Date/Time: 08/21/2020 3:21 PM Performed by: Darletta Moll, CRNA Pre-anesthesia Checklist: Patient identified, Emergency Drugs available, Suction available and Patient being monitored Patient Re-evaluated:Patient Re-evaluated prior to induction Oxygen Delivery Method: Circle system utilized Preoxygenation: Pre-oxygenation with 100% oxygen Induction Type: IV induction and Rapid sequence Laryngoscope Size: Mac and 3 Grade View: Grade I Tube type: Oral Tube size: 7.0 mm Number of attempts: 1 Airway Equipment and Method: Stylet and Oral airway Placement Confirmation: ETT inserted through vocal cords under direct vision,  positive ETCO2 and breath sounds checked- equal and bilateral Secured at: 22 cm Tube secured with: Tape Dental Injury: Teeth and Oropharynx as per pre-operative assessment

## 2020-08-18 NOTE — Anesthesia Preprocedure Evaluation (Addendum)
Anesthesia Evaluation  Patient identified by MRN, date of birth, ID band  Reviewed: Allergy & Precautions, Patient's Chart, lab work & pertinent test results, Unable to perform ROS - Chart review onlyPreop documentation limited or incomplete due to emergent nature of procedure.  Airway Mallampati: II  TM Distance: >3 FB Neck ROM: Full    Dental  (+) Partial Upper   Pulmonary sleep apnea ,    breath sounds clear to auscultation       Cardiovascular hypertension, Pt. on home beta blockers and Pt. on medications + Peripheral Vascular Disease and +CHF   Rhythm:Regular Rate:Normal     Neuro/Psych PSYCHIATRIC DISORDERS Anxiety Depression TIACVA    GI/Hepatic Neg liver ROS, GERD  Medicated,  Endo/Other  diabetes, Type 2, Insulin Dependent  Renal/GU Renal InsufficiencyRenal disease     Musculoskeletal  (+) Arthritis ,   Abdominal Normal abdominal exam  (+)   Peds  Hematology   Anesthesia Other Findings - HLD  Reproductive/Obstetrics                            Anesthesia Physical Anesthesia Plan  ASA: IV and emergent  Anesthesia Plan: General   Post-op Pain Management:    Induction: Intravenous, Rapid sequence and Cricoid pressure planned  PONV Risk Score and Plan: 4 or greater and Ondansetron and Treatment may vary due to age or medical condition  Airway Management Planned: Oral ETT  Additional Equipment: Arterial line  Intra-op Plan:   Post-operative Plan: Possible Post-op intubation/ventilation  Informed Consent: I have reviewed the patients History and Physical, chart, labs and discussed the procedure including the risks, benefits and alternatives for the proposed anesthesia with the patient or authorized representative who has indicated his/her understanding and acceptance.     History available from chart only and Only emergency history available  Plan Discussed with:  CRNA  Anesthesia Plan Comments:       Anesthesia Quick Evaluation

## 2020-08-18 NOTE — Progress Notes (Signed)
TOC CM/CSW spoke with [facility] to inform her of the current situation.  [facility] is currently on hold, but is still willing to accept pt.  CSW will continue to follow for dc needs.  Frazier Balfour Tarpley-Carter, MSW, LCSW-A Pronouns:  She, Her, Hers                  [place] ED Transitions of CareClinical Social Worker [email or web address] [phone]

## 2020-08-18 NOTE — Progress Notes (Signed)
TIMELESS STROKE RESEARCH STUDY NOTE  Patient presented with LSN y`day7 pm and was found to have aphasia and rt hemiplegia with NIHSS 16. Baseline MRS 2. CTA showed left M 2 occlusion and CTperfusion showed core 0cc and 19 cc penumbra She met criteria for Timeless stroke study Informed consent obtained from her grand daughrer Pia Mau Patient willl get iv Tenecteplase versus placebo followed by mechanical thrombectomy D/w Dr Leonel Ramsay and Manfred Arch, MD

## 2020-08-18 NOTE — Progress Notes (Signed)
At 1753, upon arrival to ICU and during bedside report w/IR nurse, R groin site began to bleed. Manual pressure applied and notified Dr. Estanislado Pandy. IR tech to bedside to redress and continue holding pressure.   R hematoma noted on right upper forehead. Notified Dr. Leonel Ramsay and marked border with skin marker. Received instructions from MD to apply pressure dressing to site if it got larger. No changes noted, will monitor closely.

## 2020-08-18 NOTE — Progress Notes (Signed)
TOC CM/CSW has spoken with Maria [name]/pts granddaughter who pt has given permission to assist with SNF decision.  Pt and granddaughter has chosen [facility].    CSW will contact [facility] to update them on pt and [name]'s decision.  CSW will continue to follow for dc needs.  [name], MSW, LCSW-A Pronouns:  She, Her, Hers                  [place] ED Transitions of CareClinical Social Worker [email or web address] [phone]

## 2020-08-18 NOTE — ED Notes (Signed)
MD Haviland at bedside.

## 2020-08-18 NOTE — Progress Notes (Signed)
Pt remains intubated and under the care of anesthesia.

## 2020-08-18 NOTE — Sedation Documentation (Signed)
Report given at bedside to PACU and 4N RN.

## 2020-08-18 NOTE — ED Provider Notes (Signed)
The nurse just alerted me that pt's ms has changed.  The pt has been here for several hours waiting for SNF placement.  The nurse tech that took care of her yesterday is the same one taking care of her today.  Yesterday, she could speak and feed herself.  The report the nurse received this morning was that she was her normal.  The nurse tech went in to feed her lunch and noticed that pt was unable to speak and could not feed herself.  LSN 1900.  Code stroke called.  Granddaughter called and updated.  Pt d/w telespecialists (Dr. Shara Blazing) and the radiologists.  Pt does have a proximal M2 lesion and IR intervention is possible.  She spoke with Dr. Estanislado Pandy and with Dr. Lorrin Goodell who will accept pt for transfer to the IR suite.  CRITICAL CARE Performed by: Isla Pence   Total critical care time: 45 minutes  Critical care time was exclusive of separately billable procedures and treating other patients.  Critical care was necessary to treat or prevent imminent or life-threatening deterioration.  Critical care was time spent personally by me on the following activities: development of treatment plan with patient and/or surrogate as well as nursing, discussions with consultants, evaluation of patient's response to treatment, examination of patient, obtaining history from patient or surrogate, ordering and performing treatments and interventions, ordering and review of laboratory studies, ordering and review of radiographic studies, pulse oximetry and re-evaluation of patient's condition.   Isla Pence, MD 08/24/2020 1459

## 2020-08-18 NOTE — Progress Notes (Signed)
TOC CM/CSW attempted to contact [facility] admissions coordinator [phone].  CSW left HIPPA compliant message with my contact information.  CSW will continue to follow for dc needs.  Gevin Perea Tarpley-Carter, MSW, LCSW-A Pronouns:  She, Her, Hers                  [place] ED Transitions of CareClinical Social Worker [email or web address] [phone]

## 2020-08-18 NOTE — Progress Notes (Signed)
TOC CM/CSW attempted to contact Maria [name]/[facility] with auth info.  CSW left HIPPA compliant message with my contact information.  CSW will continue to follow for dc needs.  Wilburta Milbourn Tarpley-Carter, MSW, LCSW-A Pronouns:  She, Her, [place] ED Transitions of CareClinical Social Worker [email or web address] [phone]

## 2020-08-18 NOTE — ED Notes (Signed)
Attempted to call report to [facility] IR x2 with no answer

## 2020-08-18 NOTE — ED Notes (Signed)
Nurse tech went into room with tray and came out concerned that the patient can no longer communicate. Nurse tech states that yesterday when she had the patient, the pt was speaking clearly. Pt has been AOx0 and babbling since 7am when care was transferred to me. During report, I was told patient has dementia and is not oriented through the night. MD made aware

## 2020-08-18 NOTE — ED Notes (Signed)
Tele neuro evaluation in progress.

## 2020-08-18 NOTE — ED Notes (Signed)
Pt diaper changed, purewick replaced and put into fresh clean gown and given warm blankets

## 2020-08-18 NOTE — ED Notes (Signed)
Pt to CT

## 2020-08-18 NOTE — Procedures (Signed)
S/P Lt common carotid arteriogram RT CFA approach. Findings. 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 2.Delated partial reconstitution of Lt MCA from Lt ACA collaterals retrogradely. Unsuccessful attempts at revascularization due to severe prox  Lt ICA tortuosity and of the aortic arch. 93F Rt groin sheath removed .hemostasis with manual compression. Distal pulses all dopplerable. Extubated. Maintaining O2 sats. No movement on the right. Unresponsive due to aphasia. S.Melanie Pellot MD

## 2020-08-18 NOTE — Progress Notes (Signed)
TOC CM/CSW received a call from [facility].  [facility] stated they would be able to accept pt today once [name] has been approved.  [facility] also stated Covid test was still good.  CSW will continue to follow for dc needs.  Eldor Conaway Tarpley-Carter, MSW, LCSW-A Pronouns:  She, Her, Hers                  [facility] Transitions of CareClinical Social Worker [email or web address] [phone]

## 2020-08-18 NOTE — Transfer of Care (Signed)
Immediate Anesthesia Transfer of Care Note  Patient: Maria [name]  Procedure(s) Performed: IR WITH ANESTHESIA - CODE STROKE (N/A )  Patient Location: ICU  Anesthesia Type:General  Level of Consciousness: alert , pateint uncooperative and responds to stimulation  Airway & Oxygen Therapy: Patient Spontanous Breathing and Patient connected to face mask oxygen  Post-op Assessment: Report given to RN, Post -op Vital signs reviewed and stable and Patient able to stick tongue midline  Post vital signs: Reviewed and stable  Last Vitals:  Vitals Value Taken Time  BP    Temp    Pulse 82 08/12/2020 1827  Resp 15 08/21/2020 1827  SpO2 100 % 08/21/2020 1827  Vitals shown include unvalidated device data.  Last Pain:  Vitals:   08/31/2020 1805  TempSrc:   PainSc: 0-No pain         Complications: No complications documented.

## 2020-08-19 ENCOUNTER — Inpatient Hospital Stay (HOSPITAL_COMMUNITY): Payer: Medicare Other

## 2020-08-19 ENCOUNTER — Encounter (HOSPITAL_COMMUNITY): Payer: Self-pay | Admitting: Interventional Radiology

## 2020-08-19 LAB — CBC WITH DIFFERENTIAL/PLATELET
Abs Immature Granulocytes: 0.05 10*3/uL (ref 0.00–0.07)
Basophils Absolute: 0 10*3/uL (ref 0.0–0.1)
Basophils Relative: 0 %
Eosinophils Absolute: 0 10*3/uL (ref 0.0–0.5)
Eosinophils Relative: 0 %
HCT: 28.5 % — ABNORMAL LOW (ref 36.0–46.0)
Hemoglobin: 8.6 g/dL — ABNORMAL LOW (ref 12.0–15.0)
Immature Granulocytes: 1 %
Lymphocytes Relative: 13 %
Lymphs Abs: 1.1 10*3/uL (ref 0.7–4.0)
MCH: 27.5 pg (ref 26.0–34.0)
MCHC: 30.2 g/dL (ref 30.0–36.0)
MCV: 91.1 fL (ref 80.0–100.0)
Monocytes Absolute: 0.5 10*3/uL (ref 0.1–1.0)
Monocytes Relative: 6 %
Neutro Abs: 6.7 10*3/uL (ref 1.7–7.7)
Neutrophils Relative %: 80 %
Platelets: 224 10*3/uL (ref 150–400)
RBC: 3.13 MIL/uL — ABNORMAL LOW (ref 3.87–5.11)
RDW: 18 % — ABNORMAL HIGH (ref 11.5–15.5)
WBC: 8.3 10*3/uL (ref 4.0–10.5)
nRBC: 0 % (ref 0.0–0.2)

## 2020-08-19 LAB — BASIC METABOLIC PANEL
Anion gap: 14 (ref 5–15)
BUN: 27 mg/dL — ABNORMAL HIGH (ref 8–23)
CO2: 21 mmol/L — ABNORMAL LOW (ref 22–32)
Calcium: 8.2 mg/dL — ABNORMAL LOW (ref 8.9–10.3)
Chloride: 102 mmol/L (ref 98–111)
Creatinine, Ser: 1.38 mg/dL — ABNORMAL HIGH (ref 0.44–1.00)
GFR, Estimated: 36 mL/min — ABNORMAL LOW (ref >60)
Glucose, Bld: 92 mg/dL (ref 70–99)
Potassium: 3.5 mmol/L (ref 3.5–5.1)
Sodium: 137 mmol/L (ref 135–145)

## 2020-08-19 LAB — HEMOGLOBIN AND HEMATOCRIT, BLOOD
HCT: 27.1 % — ABNORMAL LOW (ref 36.0–46.0)
HCT: 28.8 % — ABNORMAL LOW (ref 36.0–46.0)
Hemoglobin: 8.8 g/dL — ABNORMAL LOW (ref 12.0–15.0)
Hemoglobin: 8.7 g/dL — ABNORMAL LOW (ref 12.0–15.0)

## 2020-08-19 LAB — GLUCOSE, CAPILLARY
Glucose-Capillary: 156 mg/dL — ABNORMAL HIGH (ref 70–99)
Glucose-Capillary: 70 mg/dL (ref 70–99)
Glucose-Capillary: 78 mg/dL (ref 70–99)
Glucose-Capillary: 83 mg/dL (ref 70–99)
Glucose-Capillary: 87 mg/dL (ref 70–99)
Glucose-Capillary: 87 mg/dL (ref 70–99)

## 2020-08-19 LAB — LIPID PANEL
Cholesterol: 104 mg/dL (ref 0–200)
HDL: 39 mg/dL — ABNORMAL LOW (ref >40)
LDL Cholesterol: 50 mg/dL (ref 0–99)
Total CHOL/HDL Ratio: 2.7 RATIO
Triglycerides: 76 mg/dL (ref <150)
VLDL: 15 mg/dL (ref 0–40)

## 2020-08-19 LAB — HEMOGLOBIN A1C
Hgb A1c MFr Bld: 5.6 % (ref 4.8–5.6)
Mean Plasma Glucose: 114.02 mg/dL

## 2020-08-19 LAB — MRSA PCR SCREENING: MRSA by PCR: NEGATIVE

## 2020-08-19 MED ORDER — ORAL CARE MOUTH RINSE
15.0000 mL | Freq: Two times a day (BID) | OROMUCOSAL | Status: DC
  Administered 2020-08-19 – 2020-09-10 (×44): 15 mL via OROMUCOSAL

## 2020-08-19 MED ORDER — ASPIRIN 300 MG RE SUPP
300.0000 mg | Freq: Every day | RECTAL | Status: DC
  Administered 2020-08-19 – 2020-08-20 (×2): 300 mg via RECTAL
  Filled 2020-08-19 (×2): qty 1

## 2020-08-19 MED ORDER — SODIUM CHLORIDE 0.9 % IV BOLUS
500.0000 mL | Freq: Once | INTRAVENOUS | Status: AC
  Administered 2020-08-19: 500 mL via INTRAVENOUS

## 2020-08-19 MED ORDER — INSULIN ASPART 100 UNIT/ML ~~LOC~~ SOLN
0.0000 [IU] | SUBCUTANEOUS | Status: DC
  Administered 2020-08-21 – 2020-08-22 (×4): 2 [IU] via SUBCUTANEOUS
  Administered 2020-08-22: 3 [IU] via SUBCUTANEOUS
  Administered 2020-08-22: 17:00:00 2 [IU] via SUBCUTANEOUS
  Administered 2020-08-22 (×2): 3 [IU] via SUBCUTANEOUS
  Administered 2020-08-22 – 2020-08-23 (×2): 2 [IU] via SUBCUTANEOUS
  Administered 2020-08-23: 21:00:00 3 [IU] via SUBCUTANEOUS
  Administered 2020-08-23 (×2): 2 [IU] via SUBCUTANEOUS
  Administered 2020-08-23: 08:00:00 3 [IU] via SUBCUTANEOUS
  Administered 2020-08-23 – 2020-08-24 (×3): 2 [IU] via SUBCUTANEOUS
  Administered 2020-08-24 – 2020-08-26 (×10): 3 [IU] via SUBCUTANEOUS
  Administered 2020-08-26: 10:00:00 2 [IU] via SUBCUTANEOUS
  Administered 2020-08-26 – 2020-08-27 (×6): 3 [IU] via SUBCUTANEOUS
  Administered 2020-08-27: 09:00:00 5 [IU] via SUBCUTANEOUS
  Administered 2020-08-27 – 2020-08-28 (×5): 3 [IU] via SUBCUTANEOUS
  Administered 2020-08-28: 09:00:00 5 [IU] via SUBCUTANEOUS
  Administered 2020-08-28: 13:00:00 3 [IU] via SUBCUTANEOUS
  Administered 2020-08-28: 04:00:00 5 [IU] via SUBCUTANEOUS
  Administered 2020-08-28 – 2020-08-29 (×4): 3 [IU] via SUBCUTANEOUS
  Administered 2020-08-29: 01:00:00 5 [IU] via SUBCUTANEOUS
  Administered 2020-08-29: 12:00:00 3 [IU] via SUBCUTANEOUS
  Administered 2020-08-29: 04:00:00 5 [IU] via SUBCUTANEOUS
  Administered 2020-08-30 (×3): 3 [IU] via SUBCUTANEOUS
  Administered 2020-08-30 (×3): 5 [IU] via SUBCUTANEOUS
  Administered 2020-08-31 (×5): 3 [IU] via SUBCUTANEOUS
  Administered 2020-08-31: 21:00:00 5 [IU] via SUBCUTANEOUS
  Administered 2020-09-01 (×2): 3 [IU] via SUBCUTANEOUS
  Administered 2020-09-01: 01:00:00 5 [IU] via SUBCUTANEOUS
  Administered 2020-09-01: 05:00:00 3 [IU] via SUBCUTANEOUS

## 2020-08-19 MED ORDER — IOHEXOL 350 MG/ML SOLN
100.0000 mL | Freq: Once | INTRAVENOUS | Status: AC | PRN
  Administered 2020-08-19: 90 mL via INTRAVENOUS

## 2020-08-19 MED ORDER — SODIUM CHLORIDE 0.9 % IV SOLN: INTRAVENOUS | Status: DC

## 2020-08-19 NOTE — Anesthesia Postprocedure Evaluation (Signed)
Anesthesia Post Note  Patient: Maria [name]  Procedure(s) Performed: IR WITH ANESTHESIA - CODE STROKE (N/A )     Patient location during evaluation: ICU Anesthesia Type: General Level of consciousness: awake Pain management: pain level controlled Vital Signs Assessment: post-procedure vital signs reviewed and stable Respiratory status: spontaneous breathing, nonlabored ventilation, respiratory function stable and patient connected to nasal cannula oxygen Cardiovascular status: blood pressure returned to baseline and stable Postop Assessment: no apparent nausea or vomiting Anesthetic complications: no   No complications documented.           Effie Berkshire

## 2020-08-19 NOTE — Progress Notes (Signed)
Physical Therapy Evaluation Patient Details Name: Maria [name] MRN: [id number] DOB: [birth year] Today's Date: 08/19/2020   History of Present Illness  86 yo female back to ED with inability to ambulate, weakness, pain. Hx of severe OA R knee, DM, CKD, HF, OSA, syncope (she discharged home from [facility] on 12/7 as she refused SNF placement, but returned that pm due to inability to care for self.  While waiting in ED for placement on 12/9 she was noted to have Rt sided weakness, and decreased ability to communicate. CTA showed M2 occlusion.  Attempt at revascularization was unsuccessful.  Clinical Impression  Pt continues to have dense hemiplegia on the R side, gaze preference to the L.  She does show ability to focus on a target in R field, but is generally inattentive to the R side.  She is not totally reliable on answers to yes/no questions.  Emphasis limited to bed activity--rolling today due to bedrest.     Follow Up Recommendations SNF    Equipment Recommendations  None recommended by PT    Recommendations for Other Services       Precautions / Restrictions Precautions Precautions: Fall Precaution Comments: h/o syncope during past hospitalization      Mobility  Bed Mobility Overal bed mobility: Needs Assistance Bed Mobility: Rolling Rolling: Max assist         General bed mobility comments: multi modal cues and assist to roll Lt and Rt    Transfers                 General transfer comment: unable to attempt this date  Ambulation/Gait                Stairs            Wheelchair Mobility    Modified Rankin (Stroke Patients Only)       Balance Overall balance assessment: Needs assistance   Sitting balance-Leahy Scale: Poor Sitting balance - Comments: With HOB elevated, pt demonstrates heavy Lt lean requiring max A to correct                                     Pertinent Vitals/Pain Pain Assessment: Faces Faces Pain Scale:  No hurt    Home Living Family/patient expects to be discharged to:: Skilled nursing facility   Available Help at Discharge: Personal care attendant Type of Home: Apartment           Additional Comments: Pt was living at home with 2 hours of caregiver support 4 days/week    Prior Function Level of Independence: Needs assistance   Gait / Transfers Assistance Needed: uses rollator for household distances up until last few days  ADL's / Homemaking Assistance Needed: Aide assist with bathing but pt able to do toielting and dressing  Comments: during recent hospitalization, pt required mod A for transfers.     Hand Dominance   Dominant Hand:  (unsure)    Extremity/Trunk Assessment   Upper Extremity Assessment Upper Extremity Assessment: RUE deficits/detail RUE Deficits / Details: flaccid.  PROM WFL RUE Coordination: decreased fine motor;decreased gross motor    Lower Extremity Assessment Lower Extremity Assessment: Defer to PT evaluation    Cervical / Trunk Assessment Cervical / Trunk Assessment: Other exceptions Cervical / Trunk Exceptions: impaired trunk control  Communication   Communication: Expressive difficulties;Receptive difficulties  Cognition Arousal/Alertness: Awake/alert Behavior During Therapy: WFL for tasks assessed/performed Overall  Cognitive Status: Difficult to assess                                 General Comments: Pt with significant communication deficits and does not appear to be aware of this.  She is very pleasant and interactive.  She will follow one step motor command inconsistently with a delay and multi modal cues      General Comments General comments (skin integrity, edema, etc.): vss    Exercises     Assessment/Plan    PT Assessment    PT Problem List         PT Treatment Interventions      PT Goals (Current goals can be found in the Care Plan section)  Acute Rehab PT Goals PT Goal Formulation: With  patient Time For Goal Achievement: 08/31/20 Potential to Achieve Goals: Fair    Frequency Min 2X/week   Barriers to discharge        Co-evaluation               AM-PAC PT "6 Clicks" Mobility  Outcome Measure Help needed turning from your back to your side while in a flat bed without using bedrails?: Total Help needed moving from lying on your back to sitting on the side of a flat bed without using bedrails?: Total Help needed moving to and from a bed to a chair (including a wheelchair)?: Total Help needed standing up from a chair using your arms (e.g., wheelchair or bedside chair)?: Total Help needed to walk in hospital room?: Total Help needed climbing 3-5 steps with a railing? : Total 6 Click Score: 6    End of Session   Activity Tolerance: Patient limited by fatigue;Patient limited by pain Patient left: in bed;with call bell/phone within reach Nurse Communication: Mobility status PT Visit Diagnosis: Other abnormalities of gait and mobility (R26.89);Muscle weakness (generalized) (M62.81);Dizziness and giddiness (R42)    Time: 1448-1856 PT Time Calculation (min) (ACUTE ONLY): 22 min   Charges:   PT Evaluation $PT Re-evaluation: 1 Re-eval          08/19/2020  Ginger Carne., PT Acute Rehabilitation Services [phone]  (pager) [phone]  (office)  Tessie Fass Trapper Meech 08/19/2020, 2:12 PM

## 2020-08-19 NOTE — Progress Notes (Addendum)
STROKE TEAM PROGRESS NOTE   INTERVAL HISTORY Her ICU RN is at the bedside. Pt was enrolled in TIMELESS trial prior to thrombectomy. NPO for now awaiting SLP eval. Pt is alert, following commands, attempting to speak, but remains hemiplegic.  Blood pressure adequately controlled.  MRI scan shows moderate size left MCA nonhemorrhagic infarct without frank hemorrhagic transformation or regional mass-effect.  There are also multiple tiny punctate subcentimeter cortical and subcortical ischemic infarcts bilaterally in the cerebral and cerebellar hemispheres.  Patient has a small right frontal scalp hematoma and a very tiny right groin hematoma which have remained stable overnight.  Vitals:   08/19/20 0400 08/19/20 0500 08/19/20 0600 08/19/20 0700  BP: 138/72 (!) 137/55 (!) 142/56 108/65  Pulse: 77 76 72 76  Resp: 17 16 18 15   Temp: (!) 97.5 F (36.4 C)     TempSrc: Oral     SpO2: 98% 99% 99% 99%  Weight:      Height:       CBC:  Recent Labs  Lab 09/03/2020 1216 09/08/2020 1226 08/19/20 0529  WBC 9.2  --  8.3  NEUTROABS 7.5  --  6.7  HGB 10.2* 11.2* 8.6*  HCT 32.8* 33.0* 28.5*  MCV 90.9  --  91.1  PLT 196  --  836   Basic Metabolic Panel:  Recent Labs  Lab 08/14/20 0654 08/15/20 0600 08/27/2020 2321 08/13/2020 1216 08/13/2020 1226 08/19/20 0529  NA 137 138   < > 139 139 137  K 4.2 4.4   < > 3.6 3.6 3.5  CL 106 104   < > 102 101 102  CO2 23 25   < > 24  --  21*  GLUCOSE 85 105*   < > 115* 112* 92  BUN 18 19   < > 30* 27* 27*  CREATININE 1.12* 1.07*   < > 1.28* 1.30* 1.38*  CALCIUM 8.5* 8.4*   < > 8.9  --  8.2*  MG 1.7 1.7  --   --   --   --   PHOS 3.3 2.9  --   --   --   --    < > = values in this interval not displayed.   Lipid Panel:  Recent Labs  Lab 08/19/20 0529  CHOL 104  TRIG 76  HDL 39*  CHOLHDL 2.7  VLDL 15  LDLCALC 50   HgbA1c:  Recent Labs  Lab 08/19/20 0528  HGBA1C 5.6   Urine Drug Screen:  Recent Labs  Lab 08/30/2020 1216  LABOPIA NONE DETECTED   COCAINSCRNUR NONE DETECTED  LABBENZ NONE DETECTED  AMPHETMU NONE DETECTED  THCU NONE DETECTED  LABBARB NONE DETECTED    Alcohol Level  Recent Labs  Lab 08/17/2020 1216  ETH <10    IMAGING past 24 hours MR BRAIN WO CONTRAST  Result Date: 08/19/2020 CLINICAL DATA:  Follow-up examination for acute stroke, right-sided weakness and aphasia. EXAM: MRI HEAD WITHOUT CONTRAST TECHNIQUE: Multiplanar, multiecho pulse sequences of the brain and surrounding structures were obtained without intravenous contrast. COMPARISON:  Prior CTs from 08/23/2020. FINDINGS: Brain: Examination degraded by motion artifact. Diffuse prominence of the CSF containing spaces compatible with generalized cerebral atrophy. Patchy and confluent T2/FLAIR hyperintensity within the periventricular and deep white matter both cerebral hemispheres consistent with chronic small vessel ischemic disease, moderate in nature. Scattered remote lacunar infarcts noted about the bilateral basal ganglia/internal capsules. Few scattered remote lacunar infarcts about the midbrain, with additional scattered remote bilateral cerebellar infarcts. Confluent area of  restricted diffusion involving the mid-posterior left frontal region consistent with acute left MCA territory infarct, corresponding with perfusion deficit seen on prior CT perfusion. Suspected associated petechial hemorrhage without frank hemorrhagic transformation, although evaluation somewhat limited due to extensive SWI signal throughout the intracranial vasculature, suspected be related to iron administration. No significant regional mass effect. Multiple additional scattered cortical and subcortical ischemic infarcts seen involving the bilateral cerebral hemispheres, with involvement of the frontal, parietal, and occipital lobes, left slightly worse than right. These are predominantly subcentimeter in nature. Patchy involvement of both pre and postcentral gyri noted. Additional scattered  subcentimeter ischemic infarcts noted involving the right greater than left cerebellar hemispheres. No associated hemorrhage or mass effect about these additional infarcts. No mass lesion, midline shift or mass effect. No hydrocephalus or extra-axial fluid collection. Pituitary gland and suprasellar region within normal limits. Midline structures intact. Few additional small chronic micro hemorrhages noted at the left centrum semi ovale, left basal ganglia, and left cerebellum. Vascular: Major intracranial vascular flow voids are maintained at the skull base. Diffuse SWI signal with T1 hyperintensity throughout the intracranial vasculature, likely related to iron administration. Skull and upper cervical spine: Craniocervical junction normal. Bone marrow signal intensity normal. There is an apparent evolving right frontal scalp contusion (series 5, image 85). Sinuses/Orbits: Globes and orbital soft tissues demonstrate no acute finding. Patient status post bilateral ocular lens replacement. Mild sphenoid sinus disease. Small bilateral mastoid effusions noted, of doubtful significance. Inner ear structures grossly normal. Other: None. IMPRESSION: 1. Moderate-sized acute ischemic nonhemorrhagic left MCA territory infarct. Suspected associated petechial hemorrhage without frank hemorrhagic transformation or significant regional mass effect. Size and distribution closely matches previously seen perfusion deficit. 2. Multiple additional scattered predominantly subcentimeter cortical and subcortical ischemic infarcts involving the bilateral cerebral and cerebellar hemispheres as above. No associated hemorrhage or mass effect. 3. Underlying age-related cerebral atrophy with moderate chronic small vessel ischemic disease, with multiple remote lacunar infarcts about the deep gray nuclei, midbrain, and cerebellum. 4. Apparent evolving right frontal scalp contusion. Electronically Signed   By: Jeannine Boga M.D.   On:  08/19/2020 03:10   CT CEREBRAL PERFUSION W CONTRAST  Result Date: 08/21/2020 CLINICAL DATA:  Neuro deficit, acute, stroke suspected. Additional history provided: Right-sided facial droop, slurred speech. EXAM: CT ANGIOGRAPHY HEAD AND NECK CT PERFUSION BRAIN TECHNIQUE: Multidetector CT imaging of the head and neck was performed using the standard protocol during bolus administration of intravenous contrast. Multiplanar CT image reconstructions and MIPs were obtained to evaluate the vascular anatomy. Carotid stenosis measurements (when applicable) are obtained utilizing NASCET criteria, using the distal internal carotid diameter as the denominator. Multiphase CT imaging of the brain was performed following IV bolus contrast injection. Subsequent parametric perfusion maps were calculated using RAPID software. CONTRAST:  41mL OMNIPAQUE IOHEXOL 350 MG/ML SOLN COMPARISON:  Noncontrast head CT performed earlier today 08/17/2020. FINDINGS: CTA NECK FINDINGS Aortic arch: The left vertebral artery arises directly from the aortic arch. Atherosclerotic plaque within the visualized aortic arch and proximal major branch vessels of the neck. No hemodynamically significant innominate or proximal subclavian artery stenosis. Right carotid system: CCA and ICA patent within the neck without stenosis. Mild calcified plaque within the carotid bifurcation and proximal ICA. Left carotid system: CCA and ICA patent within the neck without significant stenosis (50% or greater). Moderate calcified plaque within the carotid bifurcation and proximal ICA. Vertebral arteries: Vertebral arteries codominant and patent within the neck without stenosis. Nonstenotic calcified plaque at the origin of the left vertebral  artery. Skeleton: No acute bony abnormality or aggressive osseous lesion. Cervical spondylosis with multilevel disc space narrowing, disc bulges, posterior disc osteophytes, uncovertebral hypertrophy and facet arthrosis. Suspected  degenerative fusion across the disc spaces at C4-C5 and C6-C7. Grade 1 anterolisthesis at C3-C4, C4-C5, C5-C6. Other neck: No pathologically enlarged cervical chain lymph nodes. Multiple thyroid nodules, the largest within the left lobe measuring 2.3 cm (series 7, image 110). Upper chest: No consolidation within the imaged lung apices. Review of the MIP images confirms the above findings CTA HEAD FINDINGS Anterior circulation: The intracranial internal carotid arteries are patent. Calcified plaque within both vessels with no more than mild stenosis. The M1 middle cerebral arteries are patent. Occlusion of a superior division proximal M2 left MCA vessel (series 11, image 23) (series 9, image 45). The A1 right anterior cerebral artery is markedly hypoplastic or absent on a developmental basis. The anterior cerebral arteries are otherwise patent. No intracranial aneurysm is identified. Posterior circulation: The intracranial vertebral arteries are patent. Nonstenotic calcified plaque within the left V4 segment. The basilar artery is patent. The posterior cerebral arteries are patent. Posterior communicating arteries are present bilaterally. Venous sinuses: Within the limitations of contrast timing, no convincing thrombus. Anatomic variants: As described Review of the MIP images confirms the above findings CT Brain Perfusion Findings: The perfusion software detects suboptimal contrast bolus timing and/or excessive motion, limiting the reliability of the perfusion data. CBF (<30%) Volume: 81mL Perfusion (Tmax>6.0s) volume: 75mL (predominantly within the left MCA vascular territory). Mismatch Volume: 44mL Infarction Location:None identified Emergent findings were called by telephone at the time of interpretation on 08/26/2020 at 1:54 pm to provider Dr. Gilford Raid, who verbally acknowledged these results. IMPRESSION: CTA neck: 1. The common carotid, internal carotid and vertebral arteries are patent within the neck without  hemodynamically significant stenosis. Atherosclerotic disease within these vessels as described. Most notably, there is moderate calcified plaque within the left carotid bifurcation and proximal ICA. 2. Multiple thyroid nodules, the largest within the left lobe measuring 2.3 cm. Nonemergent thyroid ultrasound is recommended for further evaluation. CTA head: 1. Occlusion of a superior division proximal M2 left MCA vessel. Emergent neuro-interventional consultation is recommended. 2. Calcified plaque within the intracranial ICAs with no more than mild resultant stenosis. CT perfusion head: 1. The perfusion software detects suboptimal contrast bolus timing and/or excessive motion, limiting the reliability of the perfusion data. 2. The perfusion software identifies a 19 mL region of hypoperfused parenchyma predominantly within the left MCA vascular territory. No core infarct is detected. Electronically Signed   By: Kellie Simmering DO   On: 08/15/2020 14:15   DG Chest Port 1 View  Result Date: 08/28/2020 CLINICAL DATA:  Stroke EXAM: PORTABLE CHEST 1 VIEW COMPARISON:  08/10/2020 FINDINGS: The patient is rotated to the right on today's radiograph, reducing diagnostic sensitivity and specificity. Atherosclerotic calcification of the aortic arch. Heart size within normal limits. The lungs appear clear. No blunting of the costophrenic angles. Bony demineralization is present. IMPRESSION: 1. No acute findings. 2.  Aortic Atherosclerosis (ICD10-I70.0). Electronically Signed   By: Van Clines M.D.   On: 08/12/2020 19:10   CT HEAD CODE STROKE WO CONTRAST  Addendum Date: 08/15/2020   ADDENDUM REPORT: 08/21/2020 14:03 ADDENDUM: Question of hyperdensity along the left M2 MCA in the sylvian fissure. Electronically Signed   By: Macy Mis M.D.   On: 08/28/2020 14:03   Result Date: 08/31/2020 CLINICAL DATA:  Right facial droop EXAM: CT HEAD WITHOUT CONTRAST TECHNIQUE: Contiguous axial images were  obtained from the  base of the skull through the vertex without intravenous contrast. COMPARISON:  None. FINDINGS: Brain: There is no acute intracranial hemorrhage or mass effect. Gray-white differentiation is preserved. There are new infarcts involving the left greater than right cerebellum and left basal ganglia and adjacent white matter. Additional patchy and confluent areas of hypoattenuation in the supratentorial white matter are nonspecific but may reflect mild to moderate chronic microvascular ischemic changes. Chronic bilateral basal ganglia infarcts and/or perivascular spaces. Prominence of the ventricles and sulci reflects generalized parenchymal volume loss without substantial progression. No extra-axial fluid collection. Vascular: No hyperdense vessel. There is intracranial atherosclerotic calcification at the skull base. Skull: Unremarkable. Sinuses/Orbits: No acute abnormality. Other: Mastoid air cells are clear. IMPRESSION: No acute intracranial hemorrhage or mass effect. New age-indeterminate small vessel infarcts involving left greater than right cerebellum and left basal ganglia and adjacent white matter. These results were called by telephone at the time of interpretation on 08/28/2020 at 12:36 pm to provider [facility] , who verbally acknowledged these results. Electronically Signed: By: Macy Mis M.D. On: 08/25/2020 12:41   CT ANGIO HEAD CODE STROKE  Result Date: 08/25/2020 CLINICAL DATA:  Neuro deficit, acute, stroke suspected. Additional history provided: Right-sided facial droop, slurred speech. EXAM: CT ANGIOGRAPHY HEAD AND NECK CT PERFUSION BRAIN TECHNIQUE: Multidetector CT imaging of the head and neck was performed using the standard protocol during bolus administration of intravenous contrast. Multiplanar CT image reconstructions and MIPs were obtained to evaluate the vascular anatomy. Carotid stenosis measurements (when applicable) are obtained utilizing NASCET criteria, using the distal  internal carotid diameter as the denominator. Multiphase CT imaging of the brain was performed following IV bolus contrast injection. Subsequent parametric perfusion maps were calculated using RAPID software. CONTRAST:  27mL OMNIPAQUE IOHEXOL 350 MG/ML SOLN COMPARISON:  Noncontrast head CT performed earlier today 09/07/2020. FINDINGS: CTA NECK FINDINGS Aortic arch: The left vertebral artery arises directly from the aortic arch. Atherosclerotic plaque within the visualized aortic arch and proximal major branch vessels of the neck. No hemodynamically significant innominate or proximal subclavian artery stenosis. Right carotid system: CCA and ICA patent within the neck without stenosis. Mild calcified plaque within the carotid bifurcation and proximal ICA. Left carotid system: CCA and ICA patent within the neck without significant stenosis (50% or greater). Moderate calcified plaque within the carotid bifurcation and proximal ICA. Vertebral arteries: Vertebral arteries codominant and patent within the neck without stenosis. Nonstenotic calcified plaque at the origin of the left vertebral artery. Skeleton: No acute bony abnormality or aggressive osseous lesion. Cervical spondylosis with multilevel disc space narrowing, disc bulges, posterior disc osteophytes, uncovertebral hypertrophy and facet arthrosis. Suspected degenerative fusion across the disc spaces at C4-C5 and C6-C7. Grade 1 anterolisthesis at C3-C4, C4-C5, C5-C6. Other neck: No pathologically enlarged cervical chain lymph nodes. Multiple thyroid nodules, the largest within the left lobe measuring 2.3 cm (series 7, image 110). Upper chest: No consolidation within the imaged lung apices. Review of the MIP images confirms the above findings CTA HEAD FINDINGS Anterior circulation: The intracranial internal carotid arteries are patent. Calcified plaque within both vessels with no more than mild stenosis. The M1 middle cerebral arteries are patent. Occlusion of a  superior division proximal M2 left MCA vessel (series 11, image 23) (series 9, image 45). The A1 right anterior cerebral artery is markedly hypoplastic or absent on a developmental basis. The anterior cerebral arteries are otherwise patent. No intracranial aneurysm is identified. Posterior circulation: The intracranial vertebral arteries are patent.  Nonstenotic calcified plaque within the left V4 segment. The basilar artery is patent. The posterior cerebral arteries are patent. Posterior communicating arteries are present bilaterally. Venous sinuses: Within the limitations of contrast timing, no convincing thrombus. Anatomic variants: As described Review of the MIP images confirms the above findings CT Brain Perfusion Findings: The perfusion software detects suboptimal contrast bolus timing and/or excessive motion, limiting the reliability of the perfusion data. CBF (<30%) Volume: 67mL Perfusion (Tmax>6.0s) volume: 63mL (predominantly within the left MCA vascular territory). Mismatch Volume: 3mL Infarction Location:None identified Emergent findings were called by telephone at the time of interpretation on 09/03/2020 at 1:54 pm to provider Dr. Gilford Raid, who verbally acknowledged these results. IMPRESSION: CTA neck: 1. The common carotid, internal carotid and vertebral arteries are patent within the neck without hemodynamically significant stenosis. Atherosclerotic disease within these vessels as described. Most notably, there is moderate calcified plaque within the left carotid bifurcation and proximal ICA. 2. Multiple thyroid nodules, the largest within the left lobe measuring 2.3 cm. Nonemergent thyroid ultrasound is recommended for further evaluation. CTA head: 1. Occlusion of a superior division proximal M2 left MCA vessel. Emergent neuro-interventional consultation is recommended. 2. Calcified plaque within the intracranial ICAs with no more than mild resultant stenosis. CT perfusion head: 1. The perfusion  software detects suboptimal contrast bolus timing and/or excessive motion, limiting the reliability of the perfusion data. 2. The perfusion software identifies a 19 mL region of hypoperfused parenchyma predominantly within the left MCA vascular territory. No core infarct is detected. Electronically Signed   By: Kellie Simmering DO   On: 09/03/2020 14:15   CT ANGIO NECK CODE STROKE  Result Date: 08/15/2020 CLINICAL DATA:  Neuro deficit, acute, stroke suspected. Additional history provided: Right-sided facial droop, slurred speech. EXAM: CT ANGIOGRAPHY HEAD AND NECK CT PERFUSION BRAIN TECHNIQUE: Multidetector CT imaging of the head and neck was performed using the standard protocol during bolus administration of intravenous contrast. Multiplanar CT image reconstructions and MIPs were obtained to evaluate the vascular anatomy. Carotid stenosis measurements (when applicable) are obtained utilizing NASCET criteria, using the distal internal carotid diameter as the denominator. Multiphase CT imaging of the brain was performed following IV bolus contrast injection. Subsequent parametric perfusion maps were calculated using RAPID software. CONTRAST:  47mL OMNIPAQUE IOHEXOL 350 MG/ML SOLN COMPARISON:  Noncontrast head CT performed earlier today 09/03/2020. FINDINGS: CTA NECK FINDINGS Aortic arch: The left vertebral artery arises directly from the aortic arch. Atherosclerotic plaque within the visualized aortic arch and proximal major branch vessels of the neck. No hemodynamically significant innominate or proximal subclavian artery stenosis. Right carotid system: CCA and ICA patent within the neck without stenosis. Mild calcified plaque within the carotid bifurcation and proximal ICA. Left carotid system: CCA and ICA patent within the neck without significant stenosis (50% or greater). Moderate calcified plaque within the carotid bifurcation and proximal ICA. Vertebral arteries: Vertebral arteries codominant and patent  within the neck without stenosis. Nonstenotic calcified plaque at the origin of the left vertebral artery. Skeleton: No acute bony abnormality or aggressive osseous lesion. Cervical spondylosis with multilevel disc space narrowing, disc bulges, posterior disc osteophytes, uncovertebral hypertrophy and facet arthrosis. Suspected degenerative fusion across the disc spaces at C4-C5 and C6-C7. Grade 1 anterolisthesis at C3-C4, C4-C5, C5-C6. Other neck: No pathologically enlarged cervical chain lymph nodes. Multiple thyroid nodules, the largest within the left lobe measuring 2.3 cm (series 7, image 110). Upper chest: No consolidation within the imaged lung apices. Review of the MIP images confirms the above  findings CTA HEAD FINDINGS Anterior circulation: The intracranial internal carotid arteries are patent. Calcified plaque within both vessels with no more than mild stenosis. The M1 middle cerebral arteries are patent. Occlusion of a superior division proximal M2 left MCA vessel (series 11, image 23) (series 9, image 45). The A1 right anterior cerebral artery is markedly hypoplastic or absent on a developmental basis. The anterior cerebral arteries are otherwise patent. No intracranial aneurysm is identified. Posterior circulation: The intracranial vertebral arteries are patent. Nonstenotic calcified plaque within the left V4 segment. The basilar artery is patent. The posterior cerebral arteries are patent. Posterior communicating arteries are present bilaterally. Venous sinuses: Within the limitations of contrast timing, no convincing thrombus. Anatomic variants: As described Review of the MIP images confirms the above findings CT Brain Perfusion Findings: The perfusion software detects suboptimal contrast bolus timing and/or excessive motion, limiting the reliability of the perfusion data. CBF (<30%) Volume: 15mL Perfusion (Tmax>6.0s) volume: 23mL (predominantly within the left MCA vascular territory). Mismatch  Volume: 13mL Infarction Location:None identified Emergent findings were called by telephone at the time of interpretation on 08/31/2020 at 1:54 pm to provider Dr. Gilford Raid, who verbally acknowledged these results. IMPRESSION: CTA neck: 1. The common carotid, internal carotid and vertebral arteries are patent within the neck without hemodynamically significant stenosis. Atherosclerotic disease within these vessels as described. Most notably, there is moderate calcified plaque within the left carotid bifurcation and proximal ICA. 2. Multiple thyroid nodules, the largest within the left lobe measuring 2.3 cm. Nonemergent thyroid ultrasound is recommended for further evaluation. CTA head: 1. Occlusion of a superior division proximal M2 left MCA vessel. Emergent neuro-interventional consultation is recommended. 2. Calcified plaque within the intracranial ICAs with no more than mild resultant stenosis. CT perfusion head: 1. The perfusion software detects suboptimal contrast bolus timing and/or excessive motion, limiting the reliability of the perfusion data. 2. The perfusion software identifies a 19 mL region of hypoperfused parenchyma predominantly within the left MCA vascular territory. No core infarct is detected. Electronically Signed   By: Kellie Simmering DO   On: 09/01/2020 14:15    PHYSICAL EXAM General: Appears well-developed; elderly, frail Psych: Affect appropriate to situation Eyes: No scleral injection HENT: No OP obstrucion Head: Normocephalic.  Cardiovascular: Normal rate and regular rhythm.  Respiratory: Effort normal and breath sounds normal to anterior ascultation GI: Soft.  No distension. There is no tenderness.  Skin: WDI    Neurological Examination Mental Status: Alert, shakes head yes/no, mostly reliable. Speech is very dysarthric and difficult to understand.  Moderate expressive and receptive aphasia.  Follows basic commands, more difficult w/complex commands.  Cranial Nerves: ILeft  gaze pref, but will cross midline to right. PERRL.  Right lower facial droop. Mild neglect. Sensation seems intact to face bilat.  Motor: Tone and bulk:normal tone throughout; no atrophy noted. Flaccid hemiparesis on right. Moving anti-gravity purposefully on left Sensory: decreased on right Plantars: Right: up   Left: downgoing Cerebellar: Unable to complete; no gross ataxia ntoed Gait: did not test NIHSS  18 ASSESSMENT/PLAN [name] is a 86 y.o. female with history of previous stroke, HTN, HLD, initially admitted for acute deconditioning, gen weakness post anemia/gastritis. She was an in-house code stroke when staff noted a change in mentation. CTA showed Left M2 occlusion for which she had emergent thrombectomy with successful recalculation of vessle.  Stroke: LMCA stroke; cardioembolic, source under investigation    Code Stroke CT head: hyperdensity along the left M2 MCA in the sylvian fissure  CTA head & neck: moderate calcified plaque within the left carotid bifurcation and proximal ICA. Distal branch of M2 occlusion.   CT perfusion 58ml with missmatch  MRI  Multiple scattered tiny strokes bilat, Larger LMCA stroke, small amt of petechial hemorrhage noted in bed of infarct. Small vessel ischemic disease and old lacunar strokes noted.   2D Echo pending  LDL 50  HgbA1c 5.6  VTE prophylaxis - post TIMELESS trial, hold for possible IV thrombolytic administration    Diet   Diet NPO time specified     ASA 81mg  (but this was recently put on hold by GI for acute gastritis and anemia) prior to admission, now on holding s/p TIMELESS trial for possible IV thrombolytic administration    Therapy recommendations:  SNF  Disposition:  pending  Hypertension  Home meds:  Toprol XL 25mg  daily, micardis 20mg , Lasix 40mg   Stable . Permissive hypertension to SBP 180 post acute stroke tx, but gradually normalize in 5-7 days . Will add PRNs IV while awaiting PO  access . Long-term BP goal normotensive  Hyperlipidemia  Home meds:  Lipitor 40mg , Zitia 10mg  resume in hospital once PO route est  LDL 50, goal < 70  Continue statin at discharge  Diabetes type II Controlled  Home meds:  insulin  HgbA1c 5.6, goal < 7.0  CBGs Recent Labs    08/21/2020 1128 09/02/2020 2209 08/19/20 0724  GLUCAP 93 106* 78      SSI  Other Stroke Risk Factors  Advanced Age >/= 65   Coronary artery disease  Congestive heart failure  Other Active Problems  Acute anemia and gastritis- will monitor h/h Q6h for next 24h  Incidental finding to f/u on as out pt: Multiple thyroid nodules, the largest within the left lobe measuring 2.3 cm.   Hospital day # 1  Desiree Metzger-Cihelka, ARNP-C, ANVP-BC Pager: [phone] I have personally obtained history,examined this patient, reviewed notes, independently viewed imaging studies, participated in medical decision making and plan of care.ROS completed by me personally and pertinent positives fully documented  I have made any additions or clarifications directly to the above note. Agree with note above.  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  She was enrolled in the timeless trial of IV tenecteplase versus placebo.  Follow-up MRI shows moderate size left MCA infarct as well as multiple tiny punctate bilateral's cerebellar and cerebral infarcts.  Recommend close blood pressure monitoring with strict blood pressure control as per post TPA/TNT protocol.  Speech therapy to do swallow eval.  Mobilize in bed.  Start aspirin for stroke prevention for now.  Continue ongoing stroke work-up with telemetry monitoring and echocardiogram repeat CT scan of the head and CT angiogram and perfusion as per timeless study protocol.  Long discussion over the phone with the  patient and granddaughter and answered questions.  Discussed with Dr. Estanislado Pandy. This patient is critically ill and at significant risk of neurological worsening, death and care requires constant monitoring of vital signs, hemodynamics,respiratory and cardiac monitoring, extensive review of multiple databases, frequent neurological assessment, discussion with family, other specialists and medical decision making of high complexity.I have made any additions or clarifications directly to the above note.This critical care time does not reflect procedure time, or teaching time or supervisory time of PA/NP/Med Resident etc but could involve care discussion time.  I spent 40 minutes of neurocritical care time  in the care of  this patient.   Antony Contras, MD   Antony Contras, MD Medical Director [facility] Pager: [phone] 08/19/2020 2:39 PM  To contact Stroke Continuity provider, please refer to [email or web address] After hours, contact General Neurology

## 2020-08-19 NOTE — Plan of Care (Signed)
Pt failed [facility] Stroke Swallow Screen. Order placed for SLP for swallowing and cognition evaluation.

## 2020-08-19 NOTE — Evaluation (Signed)
Occupational Therapy Evaluation Patient Details Name: Maria [name] MRN: [id number] DOB: 05-05-1926 Today's Date: 08/19/2020    History of Present Illness 86 yo female back to ED with inability to ambulate, weakness, pain. Hx of severe OA R knee, DM, CKD, HF, OSA, syncope (she discharged home from [facility] on 12/7 as she refused SNF placement, but returned that pm due to inability to care for self.  While waiting in ED for placement on 12/9 she was noted to have Rt sided weakness, and decreased ability to communicate. CTA showed M2 occlusion.  Attempt at revascularization was unsuccessful.   Clinical Impression   Pt admitted with above. She demonstrates the below listed deficits and will benefit from continued OT to maximize safety and independence with BADLs.  Pt seen for bed level eval.  She presents with dense Rt hemiplegia, significant communication deficits, impaired sitting balance (with HOB elevated), Rt visual inattention vs Rt field deficits, ideomotor apraxia.  She currently requires max A for simple grooming tasks, and total A for remainder of ADLs.  She lived alone with intermittent caregiver assist.   Recommend SNF level rehab at discharge.       Follow Up Recommendations  SNF    Equipment Recommendations  None recommended by OT    Recommendations for Other Services       Precautions / Restrictions Precautions Precautions: Fall Precaution Comments: h/o syncope during past hospitalization Restrictions Weight Bearing Restrictions: No      Mobility Bed Mobility Overal bed mobility: Needs Assistance Bed Mobility: Rolling Rolling: Max assist         General bed mobility comments: multi modal cues and assist to roll Lt and Rt    Transfers                 General transfer comment: unable to attempt this date    Balance Overall balance assessment: Needs assistance   Sitting balance-Leahy Scale: Poor Sitting balance - Comments: With HOB elevated, pt  demonstrates heavy Lt lean requiring max A to correct                                   ADL either performed or assessed with clinical judgement   ADL Overall ADL's : Needs assistance/impaired Eating/Feeding: NPO   Grooming: Wash/dry face;Brushing hair;Maximal assistance;Bed level Grooming Details (indicate cue type and reason): pt able to demonstrate the correct use of a comb, but demonstrates difficulty manipulating it and orienting her hand appropriately to it to comb hair Upper Body Bathing: Maximal assistance;Bed level   Lower Body Bathing: Total assistance;Bed level   Upper Body Dressing : Total assistance;Bed level   Lower Body Dressing: Total assistance;Bed level   Toilet Transfer: Total assistance Toilet Transfer Details (indicate cue type and reason): unable Toileting- Clothing Manipulation and Hygiene: Total assistance;Bed level               Vision   Additional Comments: Pt will look to therapist on the Lt and Rt. She does loose objects on the right, but can find them at midline     Perception Perception Comments: ? Rt visual inattention vs. field deficit   Praxis Praxis Praxis tested?: Deficits Deficits: Ideomotor    Pertinent Vitals/Pain Pain Assessment: Faces Faces Pain Scale: No hurt Pain Intervention(s): Monitored during session     Hand Dominance  (unsure)   Extremity/Trunk Assessment Upper Extremity Assessment Upper Extremity Assessment: RUE deficits/detail RUE Deficits /  Details: flaccid.  PROM WFL RUE Coordination: decreased fine motor;decreased gross motor   Lower Extremity Assessment Lower Extremity Assessment: Defer to PT evaluation   Cervical / Trunk Assessment Cervical / Trunk Assessment: Other exceptions Cervical / Trunk Exceptions: impaired trunk control   Communication Communication Communication: Expressive difficulties;Receptive difficulties   Cognition Arousal/Alertness: Awake/alert Behavior During Therapy:  WFL for tasks assessed/performed Overall Cognitive Status: Difficult to assess                                 General Comments: Pt with significant communication deficits and does not appear to be aware of this.  She is very pleasant and interactive.  She will follow one step motor command inconsistently with a delay and multi modal cues   General Comments  VSS    Exercises     Shoulder Instructions      Home Living Family/patient expects to be discharged to:: Skilled nursing facility   Available Help at Discharge: Personal care attendant Type of Home: Apartment                           Additional Comments: Pt was living at home with 2 hours of caregiver support 4 days/week      Prior Functioning/Environment Level of Independence: Needs assistance  Gait / Transfers Assistance Needed: uses rollator for household distances up until last few days ADL's / Homemaking Assistance Needed: Aide assist with bathing but pt able to do toielting and dressing   Comments: during recent hospitalization, pt required mod A for transfers.        OT Problem List: Decreased range of motion;Decreased strength;Decreased activity tolerance;Impaired balance (sitting and/or standing);Impaired vision/perception;Decreased coordination;Decreased cognition;Decreased safety awareness;Decreased knowledge of use of DME or AE;Impaired sensation;Impaired tone;Impaired UE functional use      OT Treatment/Interventions: Self-care/ADL training;Therapeutic exercise;DME and/or AE instruction;Therapeutic activities;Balance training;Patient/family education;Neuromuscular education;Manual therapy;Splinting;Cognitive remediation/compensation;Visual/perceptual remediation/compensation    OT Goals(Current goals can be found in the care plan section) Acute Rehab OT Goals OT Goal Formulation: Patient unable to participate in goal setting Time For Goal Achievement: 09/02/20 Potential to Achieve  Goals: Good ADL Goals Pt Will Perform Grooming: with mod assist;sitting Pt Will Perform Upper Body Bathing: with mod assist;sitting Pt Will Transfer to Toilet: with max assist;stand pivot transfer;bedside commode Additional ADL Goal #1: Pt will maintain EOB sitting with mod A x 5 mins in prep for ADLs and functional transfers Additional ADL Goal #2: Pt will use Rt UE as a support with max facilitation  OT Frequency: Min 2X/week   Barriers to D/C: Decreased caregiver support          Co-evaluation              AM-PAC OT "6 Clicks" Daily Activity     Outcome Measure Help from another person eating meals?: Total Help from another person taking care of personal grooming?: A Lot Help from another person toileting, which includes using toliet, bedpan, or urinal?: Total Help from another person bathing (including washing, rinsing, drying)?: Total Help from another person to put on and taking off regular upper body clothing?: Total Help from another person to put on and taking off regular lower body clothing?: Total 6 Click Score: 7   End of Session Nurse Communication: Mobility status  Activity Tolerance: Patient tolerated treatment well Patient left: in bed;with call bell/phone within reach  OT Visit Diagnosis: Hemiplegia and  hemiparesis Hemiplegia - Right/Left: Right Hemiplegia - dominant/non-dominant: Dominant Hemiplegia - caused by: Cerebral infarction                Time: 1053-1110 OT Time Calculation (min): 17 min Charges:  OT General Charges $OT Visit: 1 Visit OT Evaluation $OT Eval Moderate Complexity: 1 Mod  [name]., OTR/L Acute Rehabilitation Services Pager [phone] Office [phone]   [name] 08/19/2020, 11:44 AM

## 2020-08-19 NOTE — Progress Notes (Signed)
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 Estanislado Pandy. Patient awake and alert sitting in bed. Severely dysphasic. Follows simple commands. Can spontaneously move left side with no movements of right side. Right femoral puncture site c/d/i.  MR brain this AM: 1. Moderate-sized acute ischemic nonhemorrhagic left MCA territory infarct. Suspected associated petechial hemorrhage without frank hemorrhagic transformation or significant regional mass effect. Size and distribution closely matches previously seen perfusion deficit. 2. Multiple additional scattered predominantly subcentimeter cortical and subcortical ischemic infarcts involving the bilateral cerebral and cerebellar hemispheres as above. No associated hemorrhage or mass effect. 3. Underlying age-related cerebral atrophy with moderate chronic small vessel ischemic disease, with multiple remote lacunar infarcts about the deep gray nuclei, midbrain, and cerebellum. 4. Apparent evolving right frontal scalp contusion.   Allergies: Ace inhibitors, Betadine [povidone iodine], and Valsartan  Medications: Prior to Admission medications   Medication Sig Start Date End Date Taking? Authorizing Provider  acetaminophen (TYLENOL) 500 MG tablet Take 1 tablet (500 mg total) by mouth every 4 (four) hours as needed for mild pain or moderate pain (or Fever >/= 101). 08/17/2020  Yes Dwyane Dee, MD  allopurinol (ZYLOPRIM) 100 MG tablet Take 2 tablets (200 mg total) by mouth daily. 12/01/19  Yes Deveshwar, Abel Presto, MD   atorvastatin (LIPITOR) 40 MG tablet Take 1 tablet (40 mg total) by mouth daily. Monday, Wednesday, and Friday 09/07/2020  Yes Dwyane Dee, MD  Cholecalciferol (VITAMIN D) 50 MCG (2000 UT) tablet Take 2,000 Units by mouth daily.   Yes [provider]  citalopram (CELEXA) 20 MG tablet Take 20 mg by mouth daily.    Yes [provider]  docusate sodium (COLACE) 100 MG capsule Take 100 mg by mouth 2 (two) times daily.   Yes [provider]  donepezil (ARICEPT) 10 MG tablet TAKE 1 TABLET DAILY IN THE EVENING Patient taking differently: Take 10 mg by mouth at bedtime.  11/24/18  Yes Glendale Chard, MD  ezetimibe (ZETIA) 10 MG tablet TAKE 1 TABLET AT BEDTIME Patient taking differently: Take 10 mg by mouth daily.  03/29/20  Yes Lorretta Harp, MD  ferrous sulfate 325 (65 FE) MG EC tablet Take 1 tablet (325 mg total) by mouth 2 (two) times daily. Patient taking differently: Take 325 mg by mouth daily.  04/26/20 04/26/21 Yes [provider]  FOLBIC 2.5-25-2 MG TABS tablet TAKE 1 TABLET DAILY Patient taking differently: Take 1 tablet by mouth daily.  01/26/20  Yes Lorretta Harp, MD  furosemide (LASIX) 40 MG tablet Take 1 tablet (40 mg total) by mouth daily. 09/03/2020  Yes Dwyane Dee, MD  hydroxychloroquine (PLAQUENIL) 200 MG tablet Take 1 tablet (200 mg total) by mouth daily. 04/04/20  Yes Ofilia Neas, PA-C  Menthol-Methyl Salicylate (MUSCLE RUB) 10-15 % CREA Apply 1 application topically as needed for muscle pain.   Yes [provider]  metoprolol succinate (TOPROL-XL) 25 MG 24 hr tablet TAKE 1 TABLET(25 MG) BY MOUTH DAILY Patient taking differently: Take 25 mg by mouth daily.  01/07/20  Yes Kilroy, Luke K, PA-C  pantoprazole (PROTONIX) 40 MG tablet Take 1 tablet (40 mg total) by mouth 2 (two) times  daily before a meal. 08/19/2020  Yes Dwyane Dee, MD  telmisartan (MICARDIS) 20 MG tablet Take 1 tablet (20 mg total) by mouth daily. 08/12/2020  Yes Dwyane Dee, MD  Accu-Chek FastClix Lancets MISC CHECK BLOOD SUGAR BEFORE BREAKFAST AND DINNER 03/07/20   Glendale Chard, MD  ACCU-CHEK SMARTVIEW test strip TEST TWICE DAILY BEFORE BREAKFAST AND DINNER 10/05/19   Glendale Chard, MD  aspirin EC 81 MG tablet Take 1 tablet (81 mg total) by mouth daily. 08/24/20   Dwyane Dee, MD  Insulin Pen Needle (BD PEN NEEDLE MICRO U/F) 32G X 6 MM MISC Use as directed 12/18/18   Glendale Chard, MD     Vital Signs: BP (!) 128/56   Pulse 85   Temp 98.4 F (36.9 C) (Axillary)   Resp 16   Ht 4\' 11"  (1.499 m)   Wt 154 lb 5.2 oz (70 kg)   SpO2 98%   BMI 31.17 kg/m   Physical Exam Vitals and nursing note reviewed.  Constitutional:      General: She is not in acute distress. Pulmonary:     Effort: Pulmonary effort is normal. No respiratory distress.  Skin:    General: Skin is warm and dry.     Comments: Right femoral puncture site soft without active bleeding or hematoma.  Neurological:     Mental Status: She is alert.     Comments: Alert and awake. Severely dysarthric but follows simple commands. PERRL bilaterally. Tongue midline. Can spontaneously move left side with no movements of right side. Distal pulses (DPs) palpable bilaterally with Doppler.     Imaging: MR BRAIN WO CONTRAST  Result Date: 08/19/2020 CLINICAL DATA:  Follow-up examination for acute stroke, right-sided weakness and aphasia. EXAM: MRI HEAD WITHOUT CONTRAST TECHNIQUE: Multiplanar, multiecho pulse sequences of the brain and surrounding structures were obtained without intravenous contrast. COMPARISON:  Prior CTs from 08/17/2020. FINDINGS: Brain: Examination degraded by motion artifact. Diffuse prominence of the CSF containing spaces compatible with generalized cerebral atrophy. Patchy and confluent T2/FLAIR hyperintensity within the periventricular and deep white matter both cerebral hemispheres consistent with chronic small vessel ischemic disease, moderate in nature. Scattered  remote lacunar infarcts noted about the bilateral basal ganglia/internal capsules. Few scattered remote lacunar infarcts about the midbrain, with additional scattered remote bilateral cerebellar infarcts. Confluent area of restricted diffusion involving the mid-posterior left frontal region consistent with acute left MCA territory infarct, corresponding with perfusion deficit seen on prior CT perfusion. Suspected associated petechial hemorrhage without frank hemorrhagic transformation, although evaluation somewhat limited due to extensive SWI signal throughout the intracranial vasculature, suspected be related to iron administration. No significant regional mass effect. Multiple additional scattered cortical and subcortical ischemic infarcts seen involving the bilateral cerebral hemispheres, with involvement of the frontal, parietal, and occipital lobes, left slightly worse than right. These are predominantly subcentimeter in nature. Patchy involvement of both pre and postcentral gyri noted. Additional scattered subcentimeter ischemic infarcts noted involving the right greater than left cerebellar hemispheres. No associated hemorrhage or mass effect about these additional infarcts. No mass lesion, midline shift or mass effect. No hydrocephalus or extra-axial fluid collection. Pituitary gland and suprasellar region within normal limits. Midline structures intact. Few additional small chronic micro hemorrhages noted at the left centrum semi ovale, left basal ganglia, and left cerebellum. Vascular: Major intracranial vascular flow voids are maintained at the skull base. Diffuse SWI signal with T1 hyperintensity throughout the intracranial vasculature, likely related to iron administration. Skull and upper cervical spine: Craniocervical junction normal. Bone  marrow signal intensity normal. There is an apparent evolving right frontal scalp contusion (series 5, image 85). Sinuses/Orbits: Globes and orbital soft tissues  demonstrate no acute finding. Patient status post bilateral ocular lens replacement. Mild sphenoid sinus disease. Small bilateral mastoid effusions noted, of doubtful significance. Inner ear structures grossly normal. Other: None. IMPRESSION: 1. Moderate-sized acute ischemic nonhemorrhagic left MCA territory infarct. Suspected associated petechial hemorrhage without frank hemorrhagic transformation or significant regional mass effect. Size and distribution closely matches previously seen perfusion deficit. 2. Multiple additional scattered predominantly subcentimeter cortical and subcortical ischemic infarcts involving the bilateral cerebral and cerebellar hemispheres as above. No associated hemorrhage or mass effect. 3. Underlying age-related cerebral atrophy with moderate chronic small vessel ischemic disease, with multiple remote lacunar infarcts about the deep gray nuclei, midbrain, and cerebellum. 4. Apparent evolving right frontal scalp contusion. Electronically Signed   By: Jeannine Boga M.D.   On: 08/19/2020 03:10   CT CEREBRAL PERFUSION W CONTRAST  Result Date: 08/12/2020 CLINICAL DATA:  Neuro deficit, acute, stroke suspected. Additional history provided: Right-sided facial droop, slurred speech. EXAM: CT ANGIOGRAPHY HEAD AND NECK CT PERFUSION BRAIN TECHNIQUE: Multidetector CT imaging of the head and neck was performed using the standard protocol during bolus administration of intravenous contrast. Multiplanar CT image reconstructions and MIPs were obtained to evaluate the vascular anatomy. Carotid stenosis measurements (when applicable) are obtained utilizing NASCET criteria, using the distal internal carotid diameter as the denominator. Multiphase CT imaging of the brain was performed following IV bolus contrast injection. Subsequent parametric perfusion maps were calculated using RAPID software. CONTRAST:  71mL OMNIPAQUE IOHEXOL 350 MG/ML SOLN COMPARISON:  Noncontrast head CT performed earlier  today 08/15/2020. FINDINGS: CTA NECK FINDINGS Aortic arch: The left vertebral artery arises directly from the aortic arch. Atherosclerotic plaque within the visualized aortic arch and proximal major branch vessels of the neck. No hemodynamically significant innominate or proximal subclavian artery stenosis. Right carotid system: CCA and ICA patent within the neck without stenosis. Mild calcified plaque within the carotid bifurcation and proximal ICA. Left carotid system: CCA and ICA patent within the neck without significant stenosis (50% or greater). Moderate calcified plaque within the carotid bifurcation and proximal ICA. Vertebral arteries: Vertebral arteries codominant and patent within the neck without stenosis. Nonstenotic calcified plaque at the origin of the left vertebral artery. Skeleton: No acute bony abnormality or aggressive osseous lesion. Cervical spondylosis with multilevel disc space narrowing, disc bulges, posterior disc osteophytes, uncovertebral hypertrophy and facet arthrosis. Suspected degenerative fusion across the disc spaces at C4-C5 and C6-C7. Grade 1 anterolisthesis at C3-C4, C4-C5, C5-C6. Other neck: No pathologically enlarged cervical chain lymph nodes. Multiple thyroid nodules, the largest within the left lobe measuring 2.3 cm (series 7, image 110). Upper chest: No consolidation within the imaged lung apices. Review of the MIP images confirms the above findings CTA HEAD FINDINGS Anterior circulation: The intracranial internal carotid arteries are patent. Calcified plaque within both vessels with no more than mild stenosis. The M1 middle cerebral arteries are patent. Occlusion of a superior division proximal M2 left MCA vessel (series 11, image 23) (series 9, image 45). The A1 right anterior cerebral artery is markedly hypoplastic or absent on a developmental basis. The anterior cerebral arteries are otherwise patent. No intracranial aneurysm is identified. Posterior circulation: The  intracranial vertebral arteries are patent. Nonstenotic calcified plaque within the left V4 segment. The basilar artery is patent. The posterior cerebral arteries are patent. Posterior communicating arteries are present bilaterally. Venous sinuses: Within the limitations of  contrast timing, no convincing thrombus. Anatomic variants: As described Review of the MIP images confirms the above findings CT Brain Perfusion Findings: The perfusion software detects suboptimal contrast bolus timing and/or excessive motion, limiting the reliability of the perfusion data. CBF (<30%) Volume: 4mL Perfusion (Tmax>6.0s) volume: 58mL (predominantly within the left MCA vascular territory). Mismatch Volume: 75mL Infarction Location:None identified Emergent findings were called by telephone at the time of interpretation on 09/01/2020 at 1:54 pm to provider Dr. Gilford Raid, who verbally acknowledged these results. IMPRESSION: CTA neck: 1. The common carotid, internal carotid and vertebral arteries are patent within the neck without hemodynamically significant stenosis. Atherosclerotic disease within these vessels as described. Most notably, there is moderate calcified plaque within the left carotid bifurcation and proximal ICA. 2. Multiple thyroid nodules, the largest within the left lobe measuring 2.3 cm. Nonemergent thyroid ultrasound is recommended for further evaluation. CTA head: 1. Occlusion of a superior division proximal M2 left MCA vessel. Emergent neuro-interventional consultation is recommended. 2. Calcified plaque within the intracranial ICAs with no more than mild resultant stenosis. CT perfusion head: 1. The perfusion software detects suboptimal contrast bolus timing and/or excessive motion, limiting the reliability of the perfusion data. 2. The perfusion software identifies a 19 mL region of hypoperfused parenchyma predominantly within the left MCA vascular territory. No core infarct is detected. Electronically Signed   By:  Kellie Simmering DO   On: 08/27/2020 14:15   DG Chest Port 1 View  Result Date: 08/17/2020 CLINICAL DATA:  Stroke EXAM: PORTABLE CHEST 1 VIEW COMPARISON:  08/10/2020 FINDINGS: The patient is rotated to the right on today's radiograph, reducing diagnostic sensitivity and specificity. Atherosclerotic calcification of the aortic arch. Heart size within normal limits. The lungs appear clear. No blunting of the costophrenic angles. Bony demineralization is present. IMPRESSION: 1. No acute findings. 2.  Aortic Atherosclerosis (ICD10-I70.0). Electronically Signed   By: Van Clines M.D.   On: 08/30/2020 19:10   CT HEAD CODE STROKE WO CONTRAST  Addendum Date: 08/30/2020   ADDENDUM REPORT: 09/06/2020 14:03 ADDENDUM: Question of hyperdensity along the left M2 MCA in the sylvian fissure. Electronically Signed   By: Macy Mis M.D.   On: 09/05/2020 14:03   Result Date: 08/17/2020 CLINICAL DATA:  Right facial droop EXAM: CT HEAD WITHOUT CONTRAST TECHNIQUE: Contiguous axial images were obtained from the base of the skull through the vertex without intravenous contrast. COMPARISON:  None. FINDINGS: Brain: There is no acute intracranial hemorrhage or mass effect. Gray-white differentiation is preserved. There are new infarcts involving the left greater than right cerebellum and left basal ganglia and adjacent white matter. Additional patchy and confluent areas of hypoattenuation in the supratentorial white matter are nonspecific but may reflect mild to moderate chronic microvascular ischemic changes. Chronic bilateral basal ganglia infarcts and/or perivascular spaces. Prominence of the ventricles and sulci reflects generalized parenchymal volume loss without substantial progression. No extra-axial fluid collection. Vascular: No hyperdense vessel. There is intracranial atherosclerotic calcification at the skull base. Skull: Unremarkable. Sinuses/Orbits: No acute abnormality. Other: Mastoid air cells are clear.  IMPRESSION: No acute intracranial hemorrhage or mass effect. New age-indeterminate small vessel infarcts involving left greater than right cerebellum and left basal ganglia and adjacent white matter. These results were called by telephone at the time of interpretation on 08/10/2020 at 12:36 pm to provider [facility] , who verbally acknowledged these results. Electronically Signed: By: Macy Mis M.D. On: 09/05/2020 12:41   VAS US CAROTID  Result Date: 08/15/2020 Carotid Arterial Duplex Study Indications:  Syncope. Risk Factors:      Hypertension, hyperlipidemia, prior CVA. Comparison Study:  no prior Performing Technologist: Abram Sander RVS  Examination Guidelines: A complete evaluation includes B-mode imaging, spectral Doppler, color Doppler, and power Doppler as needed of all accessible portions of each vessel. Bilateral testing is considered an integral part of a complete examination. Limited examinations for reoccurring indications may be performed as noted.  Right Carotid Findings: +----------+--------+--------+--------+------------------+--------+           PSV cm/sEDV cm/sStenosisPlaque DescriptionComments +----------+--------+--------+--------+------------------+--------+ CCA Prox  65      13              heterogenous               +----------+--------+--------+--------+------------------+--------+ CCA Distal63      17              heterogenous               +----------+--------+--------+--------+------------------+--------+ ICA Prox  59      10      1-39%   heterogenous               +----------+--------+--------+--------+------------------+--------+ ICA Distal69      17                                         +----------+--------+--------+--------+------------------+--------+ ECA       82                                                 +----------+--------+--------+--------+------------------+--------+  +----------+--------+-------+--------+-------------------+           PSV cm/sEDV cmsDescribeArm Pressure (mmHG) +----------+--------+-------+--------+-------------------+ [id number]                                         +----------+--------+-------+--------+-------------------+ +---------+--------+--+--------+--+---------+ VertebralPSV cm/s63EDV cm/s14Antegrade +---------+--------+--+--------+--+---------+  Left Carotid Findings: +----------+--------+--------+--------+------------------+--------+           PSV cm/sEDV cm/sStenosisPlaque DescriptionComments +----------+--------+--------+--------+------------------+--------+ CCA Prox  99      19              heterogenous               +----------+--------+--------+--------+------------------+--------+ CCA Distal65      11              heterogenous               +----------+--------+--------+--------+------------------+--------+ ICA Prox  52      21      1-39%   heterogenous      tortuous +----------+--------+--------+--------+------------------+--------+ ICA Distal77      22                                         +----------+--------+--------+--------+------------------+--------+ ECA       91                                                 +----------+--------+--------+--------+------------------+--------+ +----------+--------+--------+--------+-------------------+  PSV cm/sEDV cm/sDescribeArm Pressure (mmHG) +----------+--------+--------+--------+-------------------+ [id number]                                          +----------+--------+--------+--------+-------------------+ +---------+--------+---+--------+--+---------+ VertebralPSV cm/s103EDV cm/s13Antegrade +---------+--------+---+--------+--+---------+   Summary: Right Carotid: Velocities in the right ICA are consistent with a 1-39% stenosis. Left Carotid: Velocities in the left ICA are consistent with a 1-39%  stenosis. Vertebrals: Bilateral vertebral arteries demonstrate antegrade flow. *See table(s) above for measurements and observations.  Electronically signed by Ruta Hinds MD on 08/15/2020 at 5:03:40 PM.    Final    CT ANGIO HEAD CODE STROKE  Result Date: 08/29/2020 CLINICAL DATA:  Neuro deficit, acute, stroke suspected. Additional history provided: Right-sided facial droop, slurred speech. EXAM: CT ANGIOGRAPHY HEAD AND NECK CT PERFUSION BRAIN TECHNIQUE: Multidetector CT imaging of the head and neck was performed using the standard protocol during bolus administration of intravenous contrast. Multiplanar CT image reconstructions and MIPs were obtained to evaluate the vascular anatomy. Carotid stenosis measurements (when applicable) are obtained utilizing NASCET criteria, using the distal internal carotid diameter as the denominator. Multiphase CT imaging of the brain was performed following IV bolus contrast injection. Subsequent parametric perfusion maps were calculated using RAPID software. CONTRAST:  40mL OMNIPAQUE IOHEXOL 350 MG/ML SOLN COMPARISON:  Noncontrast head CT performed earlier today 08/26/2020. FINDINGS: CTA NECK FINDINGS Aortic arch: The left vertebral artery arises directly from the aortic arch. Atherosclerotic plaque within the visualized aortic arch and proximal major branch vessels of the neck. No hemodynamically significant innominate or proximal subclavian artery stenosis. Right carotid system: CCA and ICA patent within the neck without stenosis. Mild calcified plaque within the carotid bifurcation and proximal ICA. Left carotid system: CCA and ICA patent within the neck without significant stenosis (50% or greater). Moderate calcified plaque within the carotid bifurcation and proximal ICA. Vertebral arteries: Vertebral arteries codominant and patent within the neck without stenosis. Nonstenotic calcified plaque at the origin of the left vertebral artery. Skeleton: No acute bony  abnormality or aggressive osseous lesion. Cervical spondylosis with multilevel disc space narrowing, disc bulges, posterior disc osteophytes, uncovertebral hypertrophy and facet arthrosis. Suspected degenerative fusion across the disc spaces at C4-C5 and C6-C7. Grade 1 anterolisthesis at C3-C4, C4-C5, C5-C6. Other neck: No pathologically enlarged cervical chain lymph nodes. Multiple thyroid nodules, the largest within the left lobe measuring 2.3 cm (series 7, image 110). Upper chest: No consolidation within the imaged lung apices. Review of the MIP images confirms the above findings CTA HEAD FINDINGS Anterior circulation: The intracranial internal carotid arteries are patent. Calcified plaque within both vessels with no more than mild stenosis. The M1 middle cerebral arteries are patent. Occlusion of a superior division proximal M2 left MCA vessel (series 11, image 23) (series 9, image 45). The A1 right anterior cerebral artery is markedly hypoplastic or absent on a developmental basis. The anterior cerebral arteries are otherwise patent. No intracranial aneurysm is identified. Posterior circulation: The intracranial vertebral arteries are patent. Nonstenotic calcified plaque within the left V4 segment. The basilar artery is patent. The posterior cerebral arteries are patent. Posterior communicating arteries are present bilaterally. Venous sinuses: Within the limitations of contrast timing, no convincing thrombus. Anatomic variants: As described Review of the MIP images confirms the above findings CT Brain Perfusion Findings: The perfusion software detects suboptimal contrast bolus timing and/or excessive motion, limiting the reliability of the perfusion data. CBF (<30%) Volume: 2mL  Perfusion (Tmax>6.0s) volume: 10mL (predominantly within the left MCA vascular territory). Mismatch Volume: 6mL Infarction Location:None identified Emergent findings were called by telephone at the time of interpretation on 08/25/2020 at  1:54 pm to provider Dr. Gilford Raid, who verbally acknowledged these results. IMPRESSION: CTA neck: 1. The common carotid, internal carotid and vertebral arteries are patent within the neck without hemodynamically significant stenosis. Atherosclerotic disease within these vessels as described. Most notably, there is moderate calcified plaque within the left carotid bifurcation and proximal ICA. 2. Multiple thyroid nodules, the largest within the left lobe measuring 2.3 cm. Nonemergent thyroid ultrasound is recommended for further evaluation. CTA head: 1. Occlusion of a superior division proximal M2 left MCA vessel. Emergent neuro-interventional consultation is recommended. 2. Calcified plaque within the intracranial ICAs with no more than mild resultant stenosis. CT perfusion head: 1. The perfusion software detects suboptimal contrast bolus timing and/or excessive motion, limiting the reliability of the perfusion data. 2. The perfusion software identifies a 19 mL region of hypoperfused parenchyma predominantly within the left MCA vascular territory. No core infarct is detected. Electronically Signed   By: Kellie Simmering DO   On: 08/20/2020 14:15   CT ANGIO NECK CODE STROKE  Result Date: 08/14/2020 CLINICAL DATA:  Neuro deficit, acute, stroke suspected. Additional history provided: Right-sided facial droop, slurred speech. EXAM: CT ANGIOGRAPHY HEAD AND NECK CT PERFUSION BRAIN TECHNIQUE: Multidetector CT imaging of the head and neck was performed using the standard protocol during bolus administration of intravenous contrast. Multiplanar CT image reconstructions and MIPs were obtained to evaluate the vascular anatomy. Carotid stenosis measurements (when applicable) are obtained utilizing NASCET criteria, using the distal internal carotid diameter as the denominator. Multiphase CT imaging of the brain was performed following IV bolus contrast injection. Subsequent parametric perfusion maps were calculated using RAPID  software. CONTRAST:  10mL OMNIPAQUE IOHEXOL 350 MG/ML SOLN COMPARISON:  Noncontrast head CT performed earlier today 08/17/2020. FINDINGS: CTA NECK FINDINGS Aortic arch: The left vertebral artery arises directly from the aortic arch. Atherosclerotic plaque within the visualized aortic arch and proximal major branch vessels of the neck. No hemodynamically significant innominate or proximal subclavian artery stenosis. Right carotid system: CCA and ICA patent within the neck without stenosis. Mild calcified plaque within the carotid bifurcation and proximal ICA. Left carotid system: CCA and ICA patent within the neck without significant stenosis (50% or greater). Moderate calcified plaque within the carotid bifurcation and proximal ICA. Vertebral arteries: Vertebral arteries codominant and patent within the neck without stenosis. Nonstenotic calcified plaque at the origin of the left vertebral artery. Skeleton: No acute bony abnormality or aggressive osseous lesion. Cervical spondylosis with multilevel disc space narrowing, disc bulges, posterior disc osteophytes, uncovertebral hypertrophy and facet arthrosis. Suspected degenerative fusion across the disc spaces at C4-C5 and C6-C7. Grade 1 anterolisthesis at C3-C4, C4-C5, C5-C6. Other neck: No pathologically enlarged cervical chain lymph nodes. Multiple thyroid nodules, the largest within the left lobe measuring 2.3 cm (series 7, image 110). Upper chest: No consolidation within the imaged lung apices. Review of the MIP images confirms the above findings CTA HEAD FINDINGS Anterior circulation: The intracranial internal carotid arteries are patent. Calcified plaque within both vessels with no more than mild stenosis. The M1 middle cerebral arteries are patent. Occlusion of a superior division proximal M2 left MCA vessel (series 11, image 23) (series 9, image 45). The A1 right anterior cerebral artery is markedly hypoplastic or absent on a developmental basis. The anterior  cerebral arteries are otherwise patent. No intracranial aneurysm  is identified. Posterior circulation: The intracranial vertebral arteries are patent. Nonstenotic calcified plaque within the left V4 segment. The basilar artery is patent. The posterior cerebral arteries are patent. Posterior communicating arteries are present bilaterally. Venous sinuses: Within the limitations of contrast timing, no convincing thrombus. Anatomic variants: As described Review of the MIP images confirms the above findings CT Brain Perfusion Findings: The perfusion software detects suboptimal contrast bolus timing and/or excessive motion, limiting the reliability of the perfusion data. CBF (<30%) Volume: 11mL Perfusion (Tmax>6.0s) volume: 80mL (predominantly within the left MCA vascular territory). Mismatch Volume: 59mL Infarction Location:None identified Emergent findings were called by telephone at the time of interpretation on 08/15/2020 at 1:54 pm to provider Dr. Gilford Raid, who verbally acknowledged these results. IMPRESSION: CTA neck: 1. The common carotid, internal carotid and vertebral arteries are patent within the neck without hemodynamically significant stenosis. Atherosclerotic disease within these vessels as described. Most notably, there is moderate calcified plaque within the left carotid bifurcation and proximal ICA. 2. Multiple thyroid nodules, the largest within the left lobe measuring 2.3 cm. Nonemergent thyroid ultrasound is recommended for further evaluation. CTA head: 1. Occlusion of a superior division proximal M2 left MCA vessel. Emergent neuro-interventional consultation is recommended. 2. Calcified plaque within the intracranial ICAs with no more than mild resultant stenosis. CT perfusion head: 1. The perfusion software detects suboptimal contrast bolus timing and/or excessive motion, limiting the reliability of the perfusion data. 2. The perfusion software identifies a 19 mL region of hypoperfused parenchyma  predominantly within the left MCA vascular territory. No core infarct is detected. Electronically Signed   By: Kellie Simmering DO   On: 09/05/2020 14:15    Labs:  CBC: Recent Labs    08/15/20 0600 08/15/20 1254 08/19/2020 2321 09/09/2020 1216 09/08/2020 1226 08/19/20 0529  WBC 8.3  --  10.9* 9.2  --  8.3  HGB 9.5*   < > 10.4* 10.2* 11.2* 8.6*  HCT 30.6*   < > 34.4* 32.8* 33.0* 28.5*  PLT 142*  --  142* 196  --  224   < > = values in this interval not displayed.    COAGS: Recent Labs    08/15/2020 1216  INR 0.9  APTT 34    BMP: Recent Labs    11/26/19 1144 01/06/20 1203 01/27/20 1107 06/02/20 1154 08/10/20 0841 08/15/20 0600 08/17/2020 2321 08/24/2020 1216 08/26/2020 1226 08/19/20 0529  NA 141 140 140 141   < > 138 138 139 139 137  K 4.9 4.5 4.3 3.9   < > 4.4 5.4* 3.6 3.6 3.5  CL 103 97 100 101   < > 104 104 102 101 102  CO2 30 27 32 29   < > 25 24 24   --  21*  GLUCOSE 185* 243* 306* 124*   < > 105* 155* 115* 112* 92  BUN 53* 46* 43* 38*   < > 19 26* 30* 27* 27*  CALCIUM 9.5 9.2 8.7 8.9   < > 8.4* 9.1 8.9  --  8.2*  CREATININE 1.80* 1.60* 1.52* 2.23*   < > 1.07* 1.65* 1.28* 1.30* 1.38*  GFRNONAA 24* 28* 30* 19*   < > 49* 29* 39*  --  36*  GFRAA 28* 32* 34* 21*  --   --   --   --   --   --    < > = values in this interval not displayed.    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  Estanislado Pandy. Patient's condition stable- awake and alert, severely dysarthric but follows simple commands, can spontaneously move left side with no movements of right side. Right femoral puncture site stable, distal pulses (DPs) palpable bilaterally with Doppler. Further plans per neurology- appreciate and agree with management. Please call NIR with questions/concerns.    Electronically Signed: Earley Abide, PA-C 08/19/2020, 9:41 AM   I spent a total of 15 Minutes at the the patient's bedside AND on the patient's hospital floor or unit, greater than 50% of which was counseling/coordinating care for CVA s/p revascularization.

## 2020-08-19 NOTE — Evaluation (Signed)
Clinical/Bedside Swallow Evaluation Patient Details  Name: Maria [name] MRN: [id number] Date of Birth: 1926/06/08  Today's Date: 08/19/2020 Time: SLP Start Time (ACUTE ONLY): 35 SLP Stop Time (ACUTE ONLY): 1029 SLP Time Calculation (min) (ACUTE ONLY): 10 min  Past Medical History:  Past Medical History:  Diagnosis Date  . Anxiety   . Arthritis   . Blood transfusion   . Depression   . Diabetes mellitus   . Edema   . Heart murmur   . Hyperlipidemia   . Hypertension   . Sleep apnea    cpap  . Stroke ([place])    3 x tias  . TIA (transient ischemic attack)    Past Surgical History:  Past Surgical History:  Procedure Laterality Date  . ABDOMINAL HYSTERECTOMY    . APPENDECTOMY    . BIOPSY  08/13/2020   Procedure: BIOPSY;  Surgeon: Rush Landmark Telford Nab., MD;  Location: [facility];  Service: Gastroenterology;;  . ESOPHAGOGASTRODUODENOSCOPY (EGD) WITH PROPOFOL N/A 08/13/2020   Procedure: ESOPHAGOGASTRODUODENOSCOPY (EGD) WITH PROPOFOL;  Surgeon: Irving Copas., MD;  Location: [facility];  Service: Gastroenterology;  Laterality: N/A;  . HEMOSTASIS CLIP PLACEMENT  08/13/2020   Procedure: HEMOSTASIS CLIP PLACEMENT;  Surgeon: Irving Copas., MD;  Location: [facility];  Service: Gastroenterology;;  . Carrolyn Leigh LTD  [id number]   post stress left ventricle is normal in size, ejection fraction is 65%, normal myocardial perfusion study, low risk scan  . PARTIAL HYSTERECTOMY    . TRANSTHORACIC ECHOCARDIOGRAM  [id number]   HPI:  Maria [name] is a 86 y.o. with a history of previous stroke, hypertension, DM, hyperlipidemia who was recently admitted after syncopal episode and found to have anemia with gastritis and erosions (started on PPI).  Developed with right-sided weakness and aphasia. Found to have cerebral infarction due to occlusion or stenosis of left middle cerebral artery. CXR clear. No prior ST notes.   Assessment / Plan / Recommendation Clinical Impression   Per clinical swallow assessment pt had difficulty containing bolus, right leakage and reduced manipulation given CN VII involvement. She is coughing with teaspoon sips water due to suspected discoordination and timing of pharyngeal phase versus reduced strength warranting instrumental testing.Pt demonstrates oral apraxia and aphasia in addition. Recommend continue oral care as therapist removed dried secretions from tongue/lips. Unable to have MBS today- will plan 12/11. Continue NPO- if has vital med, can crush in pudding. SLP Visit Diagnosis: Dysphagia, unspecified (R13.10)    Aspiration Risk  Mild aspiration risk    Diet Recommendation NPO   Medication Administration: Crushed with puree (if med is vital)    Other  Recommendations Oral Care Recommendations: Oral care QID   Follow up Recommendations Skilled Nursing facility      Frequency and Duration min 2x/week  2 weeks       Prognosis Prognosis for Safe Diet Advancement:  (fair-good) Barriers to Reach Goals: Other (Comment) (aphasia)      Swallow Study   General Date of Onset: 09/01/2020 HPI: Maria [name] is a 85 y.o. with a history of previous stroke, hypertension, DM, hyperlipidemia who was recently admitted after syncopal episode and found to have anemia with gastritis and erosions (started on PPI).  Developed with right-sided weakness and aphasia. Found to have cerebral infarction due to occlusion or stenosis of left middle cerebral artery. CXR clear. No prior ST notes. Type of Study: Bedside Swallow Evaluation Previous Swallow Assessment:  (none) Diet Prior to this Study: NPO Temperature Spikes Noted: No Respiratory Status:  Room air History of Recent Intubation: No Behavior/Cognition: Alert;Pleasant mood;Cooperative;Requires cueing Oral Cavity Assessment: Dry;Dried secretions Oral Care Completed by SLP: Yes Oral Cavity - Dentition: Poor condition;Missing dentition Vision:  (right neglect) Self-Feeding Abilities:  Needs assist Patient Positioning: Upright in bed Baseline Vocal Quality: Low vocal intensity Volitional Cough: Strong Volitional Swallow: Able to elicit    Oral/Motor/Sensory Function Overall Oral Motor/Sensory Function: Mild impairment Facial ROM: Reduced right;Suspected CN VII (facial) dysfunction Facial Symmetry: Abnormal symmetry right;Suspected CN VII (facial) dysfunction Facial Strength: Reduced right;Suspected CN VII (facial) dysfunction Lingual ROM:  (unable due to apraxia)   Ice Chips Ice chips: Impaired Oral Phase Impairments: Reduced lingual movement/coordination Oral Phase Functional Implications: Prolonged oral transit Pharyngeal Phase Impairments: Suspected delayed Swallow   Thin Liquid Thin Liquid: Impaired Presentation: Spoon Oral Phase Impairments: Reduced labial seal;Reduced lingual movement/coordination Oral Phase Functional Implications: Right anterior spillage Pharyngeal  Phase Impairments: Cough - Immediate;Suspected delayed Swallow    Nectar Thick Nectar Thick Liquid: Not tested   Honey Thick Honey Thick Liquid: Not tested   Puree Puree: Impaired Presentation: Spoon Oral Phase Impairments: Reduced lingual movement/coordination Pharyngeal Phase Impairments: Suspected delayed Swallow;Decreased hyoid-laryngeal movement   Solid     Solid: Not tested      [name] 08/19/2020,11:00 AM  [name].Ed Risk analyst [phone] Office [phone]

## 2020-08-20 ENCOUNTER — Inpatient Hospital Stay (HOSPITAL_COMMUNITY): Payer: Medicare Other

## 2020-08-20 DIAGNOSIS — R1312 Dysphagia, oropharyngeal phase: Secondary | ICD-10-CM

## 2020-08-20 DIAGNOSIS — D62 Acute posthemorrhagic anemia: Secondary | ICD-10-CM

## 2020-08-20 DIAGNOSIS — K922 Gastrointestinal hemorrhage, unspecified: Secondary | ICD-10-CM

## 2020-08-20 DIAGNOSIS — N179 Acute kidney failure, unspecified: Secondary | ICD-10-CM

## 2020-08-20 LAB — BASIC METABOLIC PANEL
Anion gap: 11 (ref 5–15)
BUN: 34 mg/dL — ABNORMAL HIGH (ref 8–23)
CO2: 20 mmol/L — ABNORMAL LOW (ref 22–32)
Calcium: 8 mg/dL — ABNORMAL LOW (ref 8.9–10.3)
Chloride: 108 mmol/L (ref 98–111)
Creatinine, Ser: 2.54 mg/dL — ABNORMAL HIGH (ref 0.44–1.00)
GFR, Estimated: 17 mL/min — ABNORMAL LOW (ref >60)
Glucose, Bld: 105 mg/dL — ABNORMAL HIGH (ref 70–99)
Potassium: 3.7 mmol/L (ref 3.5–5.1)
Sodium: 139 mmol/L (ref 135–145)

## 2020-08-20 LAB — CBC WITH DIFFERENTIAL/PLATELET
Abs Immature Granulocytes: 0.05 10*3/uL (ref 0.00–0.07)
Basophils Absolute: 0 10*3/uL (ref 0.0–0.1)
Basophils Relative: 0 %
Eosinophils Absolute: 0.1 10*3/uL (ref 0.0–0.5)
Eosinophils Relative: 1 %
HCT: 23.9 % — ABNORMAL LOW (ref 36.0–46.0)
Hemoglobin: 7.5 g/dL — ABNORMAL LOW (ref 12.0–15.0)
Immature Granulocytes: 1 %
Lymphocytes Relative: 12 %
Lymphs Abs: 0.9 10*3/uL (ref 0.7–4.0)
MCH: 28.8 pg (ref 26.0–34.0)
MCHC: 31.4 g/dL (ref 30.0–36.0)
MCV: 91.9 fL (ref 80.0–100.0)
Monocytes Absolute: 0.5 10*3/uL (ref 0.1–1.0)
Monocytes Relative: 7 %
Neutro Abs: 5.8 10*3/uL (ref 1.7–7.7)
Neutrophils Relative %: 79 %
Platelets: 196 10*3/uL (ref 150–400)
RBC: 2.6 MIL/uL — ABNORMAL LOW (ref 3.87–5.11)
RDW: 18.4 % — ABNORMAL HIGH (ref 11.5–15.5)
WBC: 7.3 10*3/uL (ref 4.0–10.5)
nRBC: 0 % (ref 0.0–0.2)

## 2020-08-20 LAB — GLUCOSE, CAPILLARY
Glucose-Capillary: 61 mg/dL — ABNORMAL LOW (ref 70–99)
Glucose-Capillary: 104 mg/dL — ABNORMAL HIGH (ref 70–99)
Glucose-Capillary: 97 mg/dL (ref 70–99)
Glucose-Capillary: 88 mg/dL (ref 70–99)
Glucose-Capillary: 85 mg/dL (ref 70–99)
Glucose-Capillary: 98 mg/dL (ref 70–99)
Glucose-Capillary: 113 mg/dL — ABNORMAL HIGH (ref 70–99)

## 2020-08-20 LAB — HEMOGLOBIN AND HEMATOCRIT, BLOOD
HCT: 25 % — ABNORMAL LOW (ref 36.0–46.0)
Hemoglobin: 7.5 g/dL — ABNORMAL LOW (ref 12.0–15.0)

## 2020-08-20 LAB — PHOSPHORUS: Phosphorus: 5.6 mg/dL — ABNORMAL HIGH (ref 2.5–4.6)

## 2020-08-20 LAB — MAGNESIUM: Magnesium: 1.6 mg/dL — ABNORMAL LOW (ref 1.7–2.4)

## 2020-08-20 MED ORDER — ATORVASTATIN CALCIUM 40 MG PO TABS
40.0000 mg | ORAL_TABLET | Freq: Every day | ORAL | Status: DC
  Administered 2020-08-21 – 2020-09-01 (×12): 40 mg
  Filled 2020-08-20 (×12): qty 1

## 2020-08-20 MED ORDER — DONEPEZIL HCL 10 MG PO TABS
10.0000 mg | ORAL_TABLET | Freq: Every evening | ORAL | Status: DC
  Administered 2020-08-20 – 2020-08-31 (×12): 10 mg
  Filled 2020-08-20 (×12): qty 1

## 2020-08-20 MED ORDER — ALLOPURINOL 100 MG PO TABS
200.0000 mg | ORAL_TABLET | Freq: Every day | ORAL | Status: DC
  Administered 2020-08-21 – 2020-08-24 (×4): 200 mg
  Filled 2020-08-20 (×4): qty 2

## 2020-08-20 MED ORDER — DEXTROSE 50 % IV SOLN
12.5000 g | INTRAVENOUS | Status: AC
  Administered 2020-08-20: 03:00:00 12.5 g via INTRAVENOUS
  Filled 2020-08-20: qty 50

## 2020-08-20 MED ORDER — FERROUS SULFATE 325 (65 FE) MG PO TABS: 325.0000 mg | ORAL_TABLET | Freq: Two times a day (BID) | ORAL | Status: DC

## 2020-08-20 MED ORDER — FA-PYRIDOXINE-CYANOCOBALAMIN 2.5-25-2 MG PO TABS
1.0000 | ORAL_TABLET | Freq: Every day | ORAL | Status: DC
  Administered 2020-08-21 – 2020-09-01 (×12): 1
  Filled 2020-08-20 (×12): qty 1

## 2020-08-20 MED ORDER — ASPIRIN 325 MG PO TABS
325.0000 mg | ORAL_TABLET | Freq: Every day | ORAL | Status: DC
  Filled 2020-08-20: qty 1

## 2020-08-20 MED ORDER — GLUCERNA 1.5 CAL PO LIQD
1000.0000 mL | ORAL | Status: DC
  Administered 2020-08-20 – 2020-08-22 (×2): 1000 mL
  Filled 2020-08-20 (×2): qty 1000

## 2020-08-20 MED ORDER — FREE WATER
100.0000 mL | Status: DC
  Administered 2020-08-20 – 2020-08-27 (×40): 100 mL

## 2020-08-20 MED ORDER — EZETIMIBE 10 MG PO TABS
10.0000 mg | ORAL_TABLET | Freq: Every day | ORAL | Status: DC
  Administered 2020-08-20 – 2020-08-31 (×12): 10 mg
  Filled 2020-08-20 (×12): qty 1

## 2020-08-20 MED ORDER — METOPROLOL TARTRATE 25 MG PO TABS
12.5000 mg | ORAL_TABLET | Freq: Two times a day (BID) | ORAL | Status: DC
  Administered 2020-08-20: 22:00:00 12.5 mg via ORAL
  Filled 2020-08-20 (×2): qty 1

## 2020-08-20 MED ORDER — PANTOPRAZOLE SODIUM 40 MG PO PACK
40.0000 mg | PACK | Freq: Every day | ORAL | Status: DC
  Administered 2020-08-20 – 2020-09-01 (×13): 40 mg
  Filled 2020-08-20 (×13): qty 20

## 2020-08-20 MED ORDER — PANTOPRAZOLE SODIUM 40 MG IV SOLR: 40.0000 mg | Freq: Two times a day (BID) | INTRAVENOUS | Status: DC

## 2020-08-20 MED ORDER — FERROUS SULFATE 300 (60 FE) MG/5ML PO SYRP
300.0000 mg | ORAL_SOLUTION | Freq: Two times a day (BID) | ORAL | Status: DC
  Administered 2020-08-20 – 2020-08-24 (×8): 300 mg
  Filled 2020-08-20 (×8): qty 5

## 2020-08-20 MED ORDER — PROSOURCE TF PO LIQD
45.0000 mL | Freq: Two times a day (BID) | ORAL | Status: DC
  Administered 2020-08-20 – 2020-09-01 (×24): 45 mL
  Filled 2020-08-20 (×24): qty 45

## 2020-08-20 NOTE — Progress Notes (Signed)
  Speech Language Pathology Treatment: Dysphagia  Patient Details Name: JANISHA BUESO MRN: [id number] DOB: 10/20/1925 Today's Date: 08/20/2020 Time: 5035-4656 SLP Time Calculation (min) (ACUTE ONLY): 14 min  Assessment / Plan / Recommendation Clinical Impression  Planned for MBS today however informed by RN that patient has had a change in status. Patient demonstrating some awareness of surroundings however requiring moderate-max cues to maintain adequate level of alertness to participate in therapy today. Non-verbal and unable to follow commands. In the context of a familiar task, po intake, patient able to open oral cavity for intake of ice chip trials however followed by little-no oral bolus manipulation and no initiation of pharyngeal swallow. Patient not appropriate for pos or instrumental testing today. Recommend full NPO status. Will f/u next date for changes/appropriateness for instrumental testing.    HPI HPI: NOHEALANI MEDINGER is a 86 y.o. with a history of previous stroke, hypertension, DM, hyperlipidemia who was recently admitted after syncopal episode and found to have anemia with gastritis and erosions (started on PPI).  Developed with right-sided weakness and aphasia. Found to have cerebral infarction due to occlusion or stenosis of left middle cerebral artery. CXR clear. No prior ST notes.      SLP Plan  Goals updated       Recommendations  Diet recommendations: NPO Medication Administration: Via alternative means                Oral Care Recommendations: Oral care QID Follow up Recommendations: Skilled Nursing facility SLP Visit Diagnosis: Dysphagia, unspecified (R13.10) Plan: Goals updated       GO             Gabriel Rainwater MA, CCC-SLP     Dominique Ressel Meryl 08/20/2020, 10:48 AM

## 2020-08-20 NOTE — Progress Notes (Signed)
STROKE TEAM PROGRESS NOTE   INTERVAL HISTORY RN is at the bedside. No family in room. Pt initially sleeping, with repeated stimulation, she is able to open eyes but not quite following commands, nonverbal. Pending speech follow up to decide on the swallow status. Her Cre elevated from 1.38 yesterday to today 2.54, and Hb drop to 7.5 today, both could contribute to her encephalopathy. Will follow up with speech recs, if not passing swallow, she needs NG tube and TF with FW.   Vitals:   08/20/20 0500 08/20/20 0600 08/20/20 0700 08/20/20 0800  BP: (!) 137/51 (!) 126/51 (!) 164/73 (!) 161/62  Pulse: 71 69 86 96  Resp: 12 11 15 16   Temp:      TempSrc:      SpO2: 97% 91% 99% 96%  Weight:      Height:       CBC:  Recent Labs  Lab 08/19/20 0529 08/19/20 1200 08/20/20 0025 08/20/20 0435  WBC 8.3  --   --  7.3  NEUTROABS 6.7  --   --  5.8  HGB 8.6*   < > 7.5* 7.5*  HCT 28.5*   < > 25.0* 23.9*  MCV 91.1  --   --  91.9  PLT 224  --   --  196   < > = values in this interval not displayed.   Basic Metabolic Panel:  Recent Labs  Lab 08/14/20 0654 08/15/20 0600 08/26/2020 2321 08/19/20 0529 08/20/20 0435  NA 137 138   < > 137 139  K 4.2 4.4   < > 3.5 3.7  CL 106 104   < > 102 108  CO2 23 25   < > 21* 20*  GLUCOSE 85 105*   < > 92 105*  BUN 18 19   < > 27* 34*  CREATININE 1.12* 1.07*   < > 1.38* 2.54*  CALCIUM 8.5* 8.4*   < > 8.2* 8.0*  MG 1.7 1.7  --   --   --   PHOS 3.3 2.9  --   --   --    < > = values in this interval not displayed.   Lipid Panel:  Recent Labs  Lab 08/19/20 0529  CHOL 104  TRIG 76  HDL 39*  CHOLHDL 2.7  VLDL 15  LDLCALC 50   HgbA1c:  Recent Labs  Lab 08/19/20 0528  HGBA1C 5.6   Urine Drug Screen:  Recent Labs  Lab 08/28/2020 1216  LABOPIA NONE DETECTED  COCAINSCRNUR NONE DETECTED  LABBENZ NONE DETECTED  AMPHETMU NONE DETECTED  THCU NONE DETECTED  LABBARB NONE DETECTED    Alcohol Level  Recent Labs  Lab 09/06/2020 1216  ETH <10     IMAGING past 24 hours CT ANGIO HEAD W OR WO CONTRAST  Result Date: 08/19/2020 CLINICAL DATA:  Stroke, follow-up. Timeless stroke study protocol. Stroke suspected. EXAM: CT ANGIOGRAPHY HEAD AND NECK CT PERFUSION BRAIN TECHNIQUE: Multidetector CT imaging of the head and neck was performed using the standard protocol during bolus administration of intravenous contrast. Multiplanar CT image reconstructions and MIPs were obtained to evaluate the vascular anatomy. Carotid stenosis measurements (when applicable) are obtained utilizing NASCET criteria, using the distal internal carotid diameter as the denominator. Multiphase CT imaging of the brain was performed following IV bolus contrast injection. Subsequent parametric perfusion maps were calculated using RAPID software. CONTRAST:  Administered contrast not known at this time. COMPARISON:  Brain MRI 08/19/2020. Noncontrast head CT, CT angiogram head/neck and CT  perfusion 08/11/2020. FINDINGS: CT HEAD FINDINGS Brain: Mild generalized parenchymal atrophy. Loss of gray-white differentiation within portions of the left insula and along the posterior aspect of the left sylvian fissure. A known moderate-sized acute/early subacute left MCA territory infarct within the mid to posterior left frontal lobe and left frontoparietal operculum was otherwise better appreciated on the brain MRI performed earlier today. Numerous additional predominantly subcentimeter cortical and subcortical acute/early subacute infarcts within the bilateral cerebral and cerebellar hemispheres were also better appreciated on this prior exam. No evidence of hemorrhagic conversion.  No mass effect. Stable background chronic small vessel ischemic disease. Known chronic infarcts within the bilateral deep gray nuclei and cerebellar hemispheres. No extra-axial fluid collection. No midline shift. Vascular: Reported below. Skull: Normal. Negative for fracture or focal lesion. Sinuses/Orbits: Visualized  orbits show no acute finding. Small volume frothy secretions within the left sphenoid sinus. Trace ethmoid and right sphenoid sinus mucosal thickening. CTA HEAD FINDINGS Anterior circulation: The intracranial internal carotid arteries are patent. Calcified plaque within both vessels with no more than mild stenosis. The M1 middle cerebral arteries are patent. Persistent occlusion of a superior division proximal M2 left MCA vessel (series 14, image 21) (series 16, image 28). The A1 right anterior cerebral artery is markedly hypoplastic or absent on a developmental basis. The anterior cerebral arteries are otherwise patent. No intracranial aneurysm is identified. Posterior circulation: The intracranial vertebral arteries are patent. Nonstenotic calcified plaque within the V4 left vertebral artery. The basilar artery is patent. The posterior cerebral arteries are patent. Posterior communicating arteries are present bilaterally. Venous sinuses: Within the limitations of contrast timing, no convincing thrombus. Anatomic variants: As described Review of the MIP images confirms the above findings CT Brain Perfusion Findings: CBF (<30%) Volume: 54mL Perfusion (Tmax>6.0s) volume: 2mL (left MCA vascular territory) Mismatch Volume: 52mL Infarction Location:None identified IMPRESSION: CT head: 1. Loss of gray-white differentiation within portions of the left insula and along the posterior aspect of the left sylvian fissure. A known moderate-sized acute/early subacute left MCA territory infarct was otherwise better appreciated on the brain MRI performed earlier today. 2. Numerous predominantly subcentimeter cortical and subcortical acute/early subacute infarcts within the bilateral cerebral and cerebellar hemispheres were also better appreciated on this prior exam. 3. No evidence of hemorrhagic conversion. 4. Stable background generalized parenchymal atrophy and chronic ischemic changes. CTA head: 1. Persistent occlusion of a  superior division proximal M2 left MCA vessel. 2. Calcified plaque within the intracranial ICAs with no more than mild stenosis. CT perfusion head: The perfusion software identifies no core infarct. However, there are appreciable acute/early subacute left MCA territory infarction changes on the concurrently performed non-contrast head CT, as described. The perfusion software identifies a 14 mL region of hypoperfused parenchyma in the left MCA vascular territory utilizing the Tmax>6 seconds threshold. Reported mismatch volume: 14 mL. Electronically Signed   By: Kellie Simmering DO   On: 08/19/2020 13:39   CT Head Wo Contrast  Result Date: 08/19/2020 CLINICAL DATA:  Stroke, follow-up. Timeless stroke study protocol. Stroke suspected. EXAM: CT ANGIOGRAPHY HEAD AND NECK CT PERFUSION BRAIN TECHNIQUE: Multidetector CT imaging of the head and neck was performed using the standard protocol during bolus administration of intravenous contrast. Multiplanar CT image reconstructions and MIPs were obtained to evaluate the vascular anatomy. Carotid stenosis measurements (when applicable) are obtained utilizing NASCET criteria, using the distal internal carotid diameter as the denominator. Multiphase CT imaging of the brain was performed following IV bolus contrast injection. Subsequent parametric perfusion maps were  calculated using RAPID software. CONTRAST:  Administered contrast not known at this time. COMPARISON:  Brain MRI 08/19/2020. Noncontrast head CT, CT angiogram head/neck and CT perfusion 09/06/2020. FINDINGS: CT HEAD FINDINGS Brain: Mild generalized parenchymal atrophy. Loss of gray-white differentiation within portions of the left insula and along the posterior aspect of the left sylvian fissure. A known moderate-sized acute/early subacute left MCA territory infarct within the mid to posterior left frontal lobe and left frontoparietal operculum was otherwise better appreciated on the brain MRI performed earlier today.  Numerous additional predominantly subcentimeter cortical and subcortical acute/early subacute infarcts within the bilateral cerebral and cerebellar hemispheres were also better appreciated on this prior exam. No evidence of hemorrhagic conversion.  No mass effect. Stable background chronic small vessel ischemic disease. Known chronic infarcts within the bilateral deep gray nuclei and cerebellar hemispheres. No extra-axial fluid collection. No midline shift. Vascular: Reported below. Skull: Normal. Negative for fracture or focal lesion. Sinuses/Orbits: Visualized orbits show no acute finding. Small volume frothy secretions within the left sphenoid sinus. Trace ethmoid and right sphenoid sinus mucosal thickening. CTA HEAD FINDINGS Anterior circulation: The intracranial internal carotid arteries are patent. Calcified plaque within both vessels with no more than mild stenosis. The M1 middle cerebral arteries are patent. Persistent occlusion of a superior division proximal M2 left MCA vessel (series 14, image 21) (series 16, image 28). The A1 right anterior cerebral artery is markedly hypoplastic or absent on a developmental basis. The anterior cerebral arteries are otherwise patent. No intracranial aneurysm is identified. Posterior circulation: The intracranial vertebral arteries are patent. Nonstenotic calcified plaque within the V4 left vertebral artery. The basilar artery is patent. The posterior cerebral arteries are patent. Posterior communicating arteries are present bilaterally. Venous sinuses: Within the limitations of contrast timing, no convincing thrombus. Anatomic variants: As described Review of the MIP images confirms the above findings CT Brain Perfusion Findings: CBF (<30%) Volume: 98mL Perfusion (Tmax>6.0s) volume: 69mL (left MCA vascular territory) Mismatch Volume: 42mL Infarction Location:None identified IMPRESSION: CT head: 1. Loss of gray-white differentiation within portions of the left insula and  along the posterior aspect of the left sylvian fissure. A known moderate-sized acute/early subacute left MCA territory infarct was otherwise better appreciated on the brain MRI performed earlier today. 2. Numerous predominantly subcentimeter cortical and subcortical acute/early subacute infarcts within the bilateral cerebral and cerebellar hemispheres were also better appreciated on this prior exam. 3. No evidence of hemorrhagic conversion. 4. Stable background generalized parenchymal atrophy and chronic ischemic changes. CTA head: 1. Persistent occlusion of a superior division proximal M2 left MCA vessel. 2. Calcified plaque within the intracranial ICAs with no more than mild stenosis. CT perfusion head: The perfusion software identifies no core infarct. However, there are appreciable acute/early subacute left MCA territory infarction changes on the concurrently performed non-contrast head CT, as described. The perfusion software identifies a 14 mL region of hypoperfused parenchyma in the left MCA vascular territory utilizing the Tmax>6 seconds threshold. Reported mismatch volume: 14 mL. Electronically Signed   By: Kellie Simmering DO   On: 08/19/2020 13:39   CT CEREBRAL PERFUSION W CONTRAST  Result Date: 08/19/2020 CLINICAL DATA:  Stroke, follow-up. Timeless stroke study protocol. Stroke suspected. EXAM: CT ANGIOGRAPHY HEAD AND NECK CT PERFUSION BRAIN TECHNIQUE: Multidetector CT imaging of the head and neck was performed using the standard protocol during bolus administration of intravenous contrast. Multiplanar CT image reconstructions and MIPs were obtained to evaluate the vascular anatomy. Carotid stenosis measurements (when applicable) are obtained utilizing NASCET criteria,  using the distal internal carotid diameter as the denominator. Multiphase CT imaging of the brain was performed following IV bolus contrast injection. Subsequent parametric perfusion maps were calculated using RAPID software. CONTRAST:   Administered contrast not known at this time. COMPARISON:  Brain MRI 08/19/2020. Noncontrast head CT, CT angiogram head/neck and CT perfusion 08/15/2020. FINDINGS: CT HEAD FINDINGS Brain: Mild generalized parenchymal atrophy. Loss of gray-white differentiation within portions of the left insula and along the posterior aspect of the left sylvian fissure. A known moderate-sized acute/early subacute left MCA territory infarct within the mid to posterior left frontal lobe and left frontoparietal operculum was otherwise better appreciated on the brain MRI performed earlier today. Numerous additional predominantly subcentimeter cortical and subcortical acute/early subacute infarcts within the bilateral cerebral and cerebellar hemispheres were also better appreciated on this prior exam. No evidence of hemorrhagic conversion.  No mass effect. Stable background chronic small vessel ischemic disease. Known chronic infarcts within the bilateral deep gray nuclei and cerebellar hemispheres. No extra-axial fluid collection. No midline shift. Vascular: Reported below. Skull: Normal. Negative for fracture or focal lesion. Sinuses/Orbits: Visualized orbits show no acute finding. Small volume frothy secretions within the left sphenoid sinus. Trace ethmoid and right sphenoid sinus mucosal thickening. CTA HEAD FINDINGS Anterior circulation: The intracranial internal carotid arteries are patent. Calcified plaque within both vessels with no more than mild stenosis. The M1 middle cerebral arteries are patent. Persistent occlusion of a superior division proximal M2 left MCA vessel (series 14, image 21) (series 16, image 28). The A1 right anterior cerebral artery is markedly hypoplastic or absent on a developmental basis. The anterior cerebral arteries are otherwise patent. No intracranial aneurysm is identified. Posterior circulation: The intracranial vertebral arteries are patent. Nonstenotic calcified plaque within the V4 left vertebral  artery. The basilar artery is patent. The posterior cerebral arteries are patent. Posterior communicating arteries are present bilaterally. Venous sinuses: Within the limitations of contrast timing, no convincing thrombus. Anatomic variants: As described Review of the MIP images confirms the above findings CT Brain Perfusion Findings: CBF (<30%) Volume: 67mL Perfusion (Tmax>6.0s) volume: 88mL (left MCA vascular territory) Mismatch Volume: 31mL Infarction Location:None identified IMPRESSION: CT head: 1. Loss of gray-white differentiation within portions of the left insula and along the posterior aspect of the left sylvian fissure. A known moderate-sized acute/early subacute left MCA territory infarct was otherwise better appreciated on the brain MRI performed earlier today. 2. Numerous predominantly subcentimeter cortical and subcortical acute/early subacute infarcts within the bilateral cerebral and cerebellar hemispheres were also better appreciated on this prior exam. 3. No evidence of hemorrhagic conversion. 4. Stable background generalized parenchymal atrophy and chronic ischemic changes. CTA head: 1. Persistent occlusion of a superior division proximal M2 left MCA vessel. 2. Calcified plaque within the intracranial ICAs with no more than mild stenosis. CT perfusion head: The perfusion software identifies no core infarct. However, there are appreciable acute/early subacute left MCA territory infarction changes on the concurrently performed non-contrast head CT, as described. The perfusion software identifies a 14 mL region of hypoperfused parenchyma in the left MCA vascular territory utilizing the Tmax>6 seconds threshold. Reported mismatch volume: 14 mL. Electronically Signed   By: Kellie Simmering DO   On: 08/19/2020 13:39    Neuro Interventional Radiology - Cerebral Angiogram with Intervention - Dr Estanislado Pandy 09/06/2020 5:25 PM Findings. 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 2.Delated partial  reconstitution of Lt MCA from Lt ACA collaterals retrogradely. Unsuccessful attempts at revascularization due to severe prox  Lt ICA tortuosity and of the aortic arch.  Transthoracic Echocardiogram  08/11/2020 IMPRESSIONS  1. Hyperdynamic LV function; proximal septal thickening with systolic  anterior motion of MV; LVOT velocity of 2.5 m/s that increased to 3.5 m/s;  findings c/w HOCM.  2. Left ventricular ejection fraction, by estimation, is >75%. The left  ventricle has hyperdynamic function. The left ventricle has no regional  wall motion abnormalities. There is mild left ventricular hypertrophy.  Left ventricular diastolic parameters  are consistent with Grade I diastolic dysfunction (impaired relaxation).  3. Right ventricular systolic function is normal. The right ventricular  size is normal.  4. The mitral valve is normal in structure. No evidence of mitral valve  regurgitation. No evidence of mitral stenosis.  5. The aortic valve has an indeterminant number of cusps. Aortic valve  regurgitation is not visualized. No aortic stenosis is present.  6. The inferior vena cava is normal in size with greater than 50%  respiratory variability, suggesting right atrial pressure of 3 mmHg.   PHYSICAL EXAM   Temp:  [97.6 F (36.4 C)-98.5 F (36.9 C)] 97.6 F (36.4 C) (12/11 0400) Pulse Rate:  [57-96] 96 (12/11 0800) Resp:  [9-19] 16 (12/11 0800) BP: (109-164)/(40-73) 161/62 (12/11 0800) SpO2:  [91 %-100 %] 96 % (12/11 0800) Arterial Line BP: (102-180)/(39-61) 173/45 (12/11 0600)  General - Well nourished, well developed, obtunded.  Ophthalmologic - fundi not visualized due to noncooperation.  Cardiovascular - Regular rhythm and rate.  Neuro - initially sleepy and eyes closed, but with repeated voice and pain stimulation, pt was able to open eyes but hard to maintain wakefulness without frequent stimulation. Nonverbal, only followed commands with left toe wiggling, but not to  other commands. Eyes midline, able to gaze bilaterally spontaneously, doll's eye present, bilateral pupil pinpoint, not able to check pupillary reflex. Blinking to visual threat on the left but not on the right. Right nasolabial fold flattening, right eye closure weaker than left. LUE and LLE slight withdraw to pain. RUE and RLE flaccid. Bilaterally babinski positive.   ASSESSMENT/PLAN Ms. ZANAYAH SHADOWENS is a 86 y.o. female with history of previous stroke, HTN, HLD, initially admitted for acute deconditioning, gen weakness post anemia/gastritis. She was an in-house code stroke when staff noted a change in mentation. CTA showed Left M2 occlusion for which she had emergent thrombectomy. Had IR - 12/9 5:25 PM - Occluded Lt MCA inf division in the mid M2 seg. Delated partial reconstitution of Lt MCA from Lt ACA collaterals retrogradely. Unsuccessful attempts at  revascularization due to severe prox  Lt ICA tortuosity and of the aortic arch.  Stroke: LMCA moderate stroke due to left M2 occlusion with unsuccessful IR and in TIMELESS trial, likely cardioembolic etiology, source under investigation   Code Stroke CT head: hyperdensity along the left M2 MCA in the sylvian fissure  CTA head & neck Distal branch of M2 occlusion.   CT perfusion 58ml with missmatch  MRI  Multiple scattered tiny strokes bilat, moderate LMCA stroke, small amt of petechial hemorrhage noted in bed of infarct. Small vessel ischemic disease and old lacunar strokes noted.   Repeat CTA unchanged left M2 occlusion  2D Echo -08/11/2020 - EF > 75%. No cardiac source of emboli identified.   LDL 50  HgbA1c 5.6  UDS - negative  SARS - negative  VTE prophylaxis - post TIMELESS trial, hold for possible IV thrombolytic administration - SCDs  ASA 81mg  (but this was recently put on hold by GI for  acute gastritis and anemia) prior to admission, now on ASA suppository 300 mg daily  Therapy recommendations:  SNF  Disposition:   pending  Recent GIB with anemia  12/2 admission for syncope, GIB  Found to have severe anemia with Hb 6.9  Received PRBC   EGD done showed NSAID related gastritis and gastric ulcers and Cameron's erosions. Per GI at that time, hold ASA for2 weeks from admission (restart 08/24/20), continue BID PPI for 4 months, start PO iron in 1 week.  This admission Hb 10.2->...->7.5  CBC monitoring  If Hb < 7.0, will need PRBC  Resume iron once po access  PPI IV bid  AKI on CKD  Cre 1.12->1.07->1.38->2.54  On IVF  Will follow speech recs  May need NG tube for TF and FW  BMP monitoring  Hypertension  Home meds:  Toprol XL 25 mg daily, micardis 20 mg, Lasix 40mg   Stable . Long-term BP goal 130-150 given left M2 occlusion  Hyperlipidemia  Home meds:  Lipitor 40mg , Zitia 10mg    LDL 50, goal < 70  Resume home meds once po access  Continue statin at discharge  Diabetes type II Controlled  Home meds:  Insulin  Now - SSI  HgbA1c 5.6, goal < 7.0  CBG monitoring  Avoid hypoglycemia  Dysphagia   Did not pass swallow  NPO  On IVF  Speech on board  MBS today vs. NG tube today  Resume home meds after po access  Other Stroke Risk Factors  Advanced Age >/= 6   Coronary artery disease  Congestive heart failure  Other Active Problems and Findings  Code status - Full code  Incidental finding to f/u on as out pt: Multiple thyroid nodules, the largest within the left lobe measuring 2.3 cm.   Pt was enrolled in TIMELESS trial prior to thrombectomy.  Small right frontal scalp hematoma   Rt groin hematoma    Hospital day # 2  This patient is critically ill due to left MCA stroke, enrolled in TIMELESS trial, unsuccessful thrombectomy, AKI, anemia, obtundation, dyaphagia and at significant risk of neurological worsening, death form recurrent stroke, hemorrhagic conversion, renal failure, hypovolumic shock, heart failure, seizure. This patient's care  requires constant monitoring of vital signs, hemodynamics, respiratory and cardiac monitoring, review of multiple databases, neurological assessment, discussion with family, other specialists and medical decision making of high complexity. I spent 40 minutes of neurocritical care time in the care of this patient.  Maria Hawking, MD PhD Stroke Neurology 08/20/2020 10:18 AM      To contact Stroke Continuity provider, please refer to [email or web address] After hours, contact General Neurology

## 2020-08-21 ENCOUNTER — Inpatient Hospital Stay (HOSPITAL_COMMUNITY): Payer: Medicare Other

## 2020-08-21 DIAGNOSIS — Z4659 Encounter for fitting and adjustment of other gastrointestinal appliance and device: Secondary | ICD-10-CM

## 2020-08-21 LAB — CBC
HCT: 27 % — ABNORMAL LOW (ref 36.0–46.0)
Hemoglobin: 8.2 g/dL — ABNORMAL LOW (ref 12.0–15.0)
MCH: 27.8 pg (ref 26.0–34.0)
MCHC: 30.4 g/dL (ref 30.0–36.0)
MCV: 91.5 fL (ref 80.0–100.0)
Platelets: 216 10*3/uL (ref 150–400)
RBC: 2.95 MIL/uL — ABNORMAL LOW (ref 3.87–5.11)
RDW: 18.6 % — ABNORMAL HIGH (ref 11.5–15.5)
WBC: 8.9 10*3/uL (ref 4.0–10.5)
nRBC: 0 % (ref 0.0–0.2)

## 2020-08-21 LAB — GLUCOSE, CAPILLARY
Glucose-Capillary: 103 mg/dL — ABNORMAL HIGH (ref 70–99)
Glucose-Capillary: 121 mg/dL — ABNORMAL HIGH (ref 70–99)
Glucose-Capillary: 118 mg/dL — ABNORMAL HIGH (ref 70–99)
Glucose-Capillary: 139 mg/dL — ABNORMAL HIGH (ref 70–99)
Glucose-Capillary: 149 mg/dL — ABNORMAL HIGH (ref 70–99)
Glucose-Capillary: 165 mg/dL — ABNORMAL HIGH (ref 70–99)

## 2020-08-21 LAB — URINALYSIS, ROUTINE W REFLEX MICROSCOPIC
Bilirubin Urine: NEGATIVE
Glucose, UA: NEGATIVE mg/dL
Ketones, ur: NEGATIVE mg/dL
Nitrite: NEGATIVE
Protein, ur: 30 mg/dL — AB
Specific Gravity, Urine: 1.003 — ABNORMAL LOW (ref 1.005–1.030)
Squamous Epithelial / HPF: >50 — ABNORMAL HIGH (ref 0–5)
WBC, UA: >50 WBC/hpf — ABNORMAL HIGH (ref 0–5)
pH: 5 (ref 5.0–8.0)

## 2020-08-21 LAB — MAGNESIUM
Magnesium: 1.8 mg/dL (ref 1.7–2.4)
Magnesium: 1.9 mg/dL (ref 1.7–2.4)

## 2020-08-21 LAB — BASIC METABOLIC PANEL
Anion gap: 12 (ref 5–15)
BUN: 44 mg/dL — ABNORMAL HIGH (ref 8–23)
CO2: 19 mmol/L — ABNORMAL LOW (ref 22–32)
Calcium: 8.1 mg/dL — ABNORMAL LOW (ref 8.9–10.3)
Chloride: 110 mmol/L (ref 98–111)
Creatinine, Ser: 3.56 mg/dL — ABNORMAL HIGH (ref 0.44–1.00)
GFR, Estimated: 12 mL/min — ABNORMAL LOW (ref >60)
Glucose, Bld: 118 mg/dL — ABNORMAL HIGH (ref 70–99)
Potassium: 3.9 mmol/L (ref 3.5–5.1)
Sodium: 141 mmol/L (ref 135–145)

## 2020-08-21 LAB — PHOSPHORUS
Phosphorus: 6.1 mg/dL — ABNORMAL HIGH (ref 2.5–4.6)
Phosphorus: 5.5 mg/dL — ABNORMAL HIGH (ref 2.5–4.6)

## 2020-08-21 MED ORDER — LACTATED RINGERS IV SOLN: INTRAVENOUS | Status: DC

## 2020-08-21 MED ORDER — METOPROLOL TARTRATE 25 MG PO TABS
12.5000 mg | ORAL_TABLET | Freq: Two times a day (BID) | ORAL | Status: DC
  Administered 2020-08-21 – 2020-08-22 (×2): 12.5 mg
  Filled 2020-08-21 (×2): qty 1

## 2020-08-21 MED ORDER — ASPIRIN 325 MG PO TABS
325.0000 mg | ORAL_TABLET | Freq: Every day | ORAL | Status: DC
  Administered 2020-08-22 – 2020-09-01 (×11): 325 mg
  Filled 2020-08-21 (×11): qty 1

## 2020-08-21 NOTE — Progress Notes (Signed)
Patient seen by nephrology. Order for UA noted, patient remains oliguric despite IVF and FWF via NGT. Bladder scanned with 0cc noted. Order received to straight cath patient to obtain UA. Patient straight cathed by this RN and Patent examiner with only small amount of white and brown pus from urethra noted upon insertion. Dr. Candiss Norse made aware of same, culture obtained as well. Renal sono pending.

## 2020-08-21 NOTE — Consult Note (Signed)
Nephrology Consult  [facility]  Requesting provider: Rosalin Hawking, MD  Assessment/Recommendations:  Oliguric AKI (worsening): Likely secondary to contrast induced AKI (contrast 12/9 and 12/10) in the setting of her being hypovolemic -renal u/s -UA w/ microscopy -supportive care with isotonic fluids, consider LR -at this junction, she is not an ideal candidate for renal replacement therapy given her advanced age, poor functionality (even prior to this admission), dementia. Would recommend addressing goals of care with the family given her current clinical situation -Continue to monitor daily Cr, Dose meds for GFR<15 -Monitor Daily I/Os, Daily weight  -Maintain MAP>65 for optimal renal perfusion.  -Agree with holding ACE-I, avoid further nephrotoxins including NSAIDS, Morphine.  Unless absolutely necessary, avoid CT with contrast and/or MRI with gadolinium.     Hypertension: -goal SBP 130-150 given CVA -on metoprolol and cleviprex as needed  Metabolic acidosis -secondary to AKI, monitor for now  Dysphagia -s/p NGT, receiving TF's and FWF  Anemia -Transfuse for Hgb<7 g/dL -recent GIB, on PPI. On iron   Diabetes Mellitus Type 2 with Hyperglycemia -mgmt per primary service   Recommendations conveyed to primary service.    [facility] 08/21/2020 1:24 PM   _____________________________________________________________________________________   History of Present Illness: Maria [name] is a 86 y.o. female with a past medical history of CKD, hypertension, gout, dementia, DM 2, hyperlipidemia, previous stroke who presents to [facility] initially with dizziness and requesting placement for SNF.  She was recently admitted after a syncopal episode with associated dizziness and nausea and was found to have significant anemia secondary to gastritis and erosions.  There was also concern for overdiuresis at that time.  She was deconditioned and there  was recommendation for her to go to assisted living however patient had refused.  She is not able to manage everything at home on her own therefore she came back.  While in the ER when she was about to get lunch she was noted to be confused and aphasic with right-sided weakness.  A code stroke was activated and was found that the patient had M2 occlusion status post emergent thrombectomy.  She did receive perfusion and angio on 12/9 and 12/10 (100 cc of contrast on both days).  Last dose of Lasix was 12/9. She has also now been oliguric. ROS unobtainable given aphasia and mentation.    Medications:  Current Facility-Administered Medications  Medication Dose Route Frequency Provider Last Rate Last Admin  . 0.9 %  sodium chloride infusion   Intravenous Continuous Rosalin Hawking, MD 50 mL/hr at 08/21/20 1319 New Bag at 08/21/20 1319  . acetaminophen (TYLENOL) 160 MG/5ML solution 650 mg  650 mg Per Tube Q4H PRN Luanne Bras, MD       Or  . acetaminophen (TYLENOL) suppository 650 mg  650 mg Rectal Q4H PRN Deveshwar, Sanjeev, MD      . allopurinol (ZYLOPRIM) tablet 200 mg  200 mg Per Tube Daily Rosalin Hawking, MD   200 mg at 08/21/20 1009  . [START ON 08/22/2020] aspirin tablet 325 mg  325 mg Per Tube Daily Rosalin Hawking, MD      . atorvastatin (LIPITOR) tablet 40 mg  40 mg Per Tube Daily Rosalin Hawking, MD   40 mg at 08/21/20 1007  . Chlorhexidine Gluconate Cloth 2 % PADS 6 each  6 each Topical Daily Greta Doom, MD   6 each at 08/21/20 1039  . clevidipine (CLEVIPREX) infusion 0.5 mg/mL  0-21 mg/hr Intravenous Continuous Luanne Bras, MD 4  mL/hr at 08/30/2020 1757 2 mg/hr at 09/02/2020 1757  . donepezil (ARICEPT) tablet 10 mg  10 mg Per Tube QPM Rosalin Hawking, MD   10 mg at 08/20/20 1738  . ezetimibe (ZETIA) tablet 10 mg  10 mg Per Tube QHS Rosalin Hawking, MD   10 mg at 08/20/20 2223  . feeding supplement (GLUCERNA 1.5 CAL) liquid 1,000 mL  1,000 mL Per Tube Continuous Rosalin Hawking, MD 40 mL/hr at  08/21/20 1100 Infusion Verify at 08/21/20 1100  . feeding supplement (PROSource TF) liquid 45 mL  45 mL Per Tube BID Rosalin Hawking, MD   45 mL at 08/21/20 1008  . ferrous sulfate 300 (60 Fe) MG/5ML syrup 300 mg  300 mg Per Tube BID WC Rosalin Hawking, MD   300 mg at 08/21/20 0742  . folic acid-pyridoxine-cyancobalamin (FOLTX) 2.5-25-2 MG per tablet 1 tablet  1 tablet Per Tube Daily Rosalin Hawking, MD   1 tablet at 08/21/20 1009  . free water 100 mL  100 mL Per Tube Q4H Rosalin Hawking, MD   100 mL at 08/21/20 1158  . insulin aspart (novoLOG) injection 0-15 Units  0-15 Units Subcutaneous Q4H Garvin Fila, MD   2 Units at 08/21/20 [phone]  . MEDLINE mouth rinse  15 mL Mouth Rinse BID Garvin Fila, MD   15 mL at 08/21/20 1039  . metoprolol tartrate (LOPRESSOR) tablet 12.5 mg  12.5 mg Per Tube BID Rosalin Hawking, MD      . pantoprazole sodium (PROTONIX) 40 mg/20 mL oral suspension 40 mg  40 mg Per Tube Daily Rosalin Hawking, MD   40 mg at 08/21/20 1039     ALLERGIES Ace inhibitors, Betadine [povidone iodine], and Valsartan  MEDICAL HISTORY Past Medical History:  Diagnosis Date  . Anxiety   . Arthritis   . Blood transfusion   . Depression   . Diabetes mellitus   . Edema   . Heart murmur   . Hyperlipidemia   . Hypertension   . Sleep apnea    cpap  . Stroke ([place])    3 x tias  . TIA (transient ischemic attack)      SOCIAL HISTORY Social History   Socioeconomic History  . Marital status: Widowed    Spouse name: Not on file  . Number of children: 2  . Years of education: Not on file  . Highest education level: Not on file  Occupational History  . Occupation: retired  Tobacco Use  . Smoking status: Never Smoker  . Smokeless tobacco: Never Used  Vaping Use  . Vaping Use: Never used  Substance and Sexual Activity  . Alcohol use: No  . Drug use: No  . Sexual activity: Not Currently  Other Topics Concern  . Not on file  Social History Narrative   Has SCAT--pt states she is using her friend  for transportation now      Lives alone at [place] in an apartment for seniors. She has an aid that comes 4 days a week for 2 hours.    Right handed   Social Determinants of Health   Financial Resource Strain: Low Risk   . Difficulty of Paying Living Expenses: Not hard at all  Food Insecurity: No Food Insecurity  . Worried About Charity fundraiser in the Last Year: Never true  . Ran Out of Food in the Last Year: Never true  Transportation Needs: No Transportation Needs  . Lack of Transportation (Medical): No  . Lack of  Transportation (Non-Medical): No  Physical Activity: Inactive  . Days of Exercise per Week: 0 days  . Minutes of Exercise per Session: 0 min  Stress: No Stress Concern Present  . Feeling of Stress : Not at all  Social Connections: Moderately Integrated  . Frequency of Communication with Friends and Family: More than three times a week  . Frequency of Social Gatherings with Friends and Family: More than three times a week  . Attends Religious Services: More than 4 times per year  . Active Member of Clubs or Organizations: Yes  . Attends Archivist Meetings: More than 4 times per year  . Marital Status: Widowed  Intimate Partner Violence: Not on file     FAMILY HISTORY Family History  Problem Relation Age of Onset  . Healthy Mother   . Prostate cancer Father   . Cervical cancer Granddaughter   . Heart Problems Son   . Prostate cancer Son   . Neuropathy Neg Hx      Review of Systems: 12 systems reviewed Otherwise as per HPI, all other systems reviewed and negative  Physical Exam: Vitals:   08/21/20 0900 08/21/20 1100  BP: (!) 164/84 (!) 151/74  Pulse: 79 80  Resp: 12 13  Temp:    SpO2: 99% 97%   Total I/O In: 326.2 [I.V.:166.2; NG/GT:160] Out: -   Intake/Output Summary (Last 24 hours) at 08/21/2020 1324 Last data filed at 08/21/2020 1100 Gross per 24 hour  Intake 1847.59 ml  Output 100 ml  Net 1747.59 ml   General:  ill-appearing, nad HEENT: anicteric sclera, oropharynx clear without lesions CV: regular rate, normal rhythm, no murmurs, no gallops, no rubs Lungs: clear to auscultation bilaterally, normal work of breathing Abd: soft, non-tender, non-distended Skin: no visible lesions or rashes Psych: alert, engaged, appropriate mood and affect Musculoskeletal: no obvious deformities Neuro: severe dysarthria, not following all command, right ue weakness  Test Results Reviewed Lab Results  Component Value Date   NA 141 08/21/2020   K 3.9 08/21/2020   CL 110 08/21/2020   CO2 19 (L) 08/21/2020   BUN 44 (H) 08/21/2020   CREATININE 3.56 (H) 08/21/2020   CALCIUM 8.1 (L) 08/21/2020   ALBUMIN 2.6 (L) 08/26/2020   PHOS 6.1 (H) 08/21/2020     I have reviewed all relevant outside healthcare records related to the patient's kidney injury.

## 2020-08-21 NOTE — Progress Notes (Signed)
STROKE TEAM PROGRESS NOTE   INTERVAL HISTORY RN is at the bedside. No family in room. Pt eyes open, awake, tracking bilaterally. However, severe dysarthria, intangible words, able to follow some midline commands, but not peripheral commands. Had NGT yesterday, now on TF and FW. On IVF. However, Cre continues to elevate, today 3.5 with decreased urine output, will call nephrology for consult.   Vitals:   08/21/20 0400 08/21/20 0500 08/21/20 0600 08/21/20 0700  BP: (!) 150/64 (!) 160/71 (!) 168/89 (!) 161/64  Pulse: 65 70 (!) 58 70  Resp: (!) 9 11 (!) 9 10  Temp: 97.9 F (36.6 C)     TempSrc: Oral     SpO2: 99% 100% 99% 99%  Weight:      Height:       CBC:  Recent Labs  Lab 08/19/20 0529 08/19/20 1200 08/20/20 0435 08/21/20 0442  WBC 8.3  --  7.3 8.9  NEUTROABS 6.7  --  5.8  --   HGB 8.6*   < > 7.5* 8.2*  HCT 28.5*   < > 23.9* 27.0*  MCV 91.1  --  91.9 91.5  PLT 224  --  196 216   < > = values in this interval not displayed.   Basic Metabolic Panel:  Recent Labs  Lab 08/20/20 0435 08/20/20 1757 08/21/20 0442  NA 139  --  141  K 3.7  --  3.9  CL 108  --  110  CO2 20*  --  19*  GLUCOSE 105*  --  118*  BUN 34*  --  44*  CREATININE 2.54*  --  3.56*  CALCIUM 8.0*  --  8.1*  MG  --  1.6* 1.8  PHOS  --  5.6* 6.1*   Lipid Panel:  Recent Labs  Lab 08/19/20 0529  CHOL 104  TRIG 76  HDL 39*  CHOLHDL 2.7  VLDL 15  LDLCALC 50   HgbA1c:  Recent Labs  Lab 08/19/20 0528  HGBA1C 5.6   Urine Drug Screen:  Recent Labs  Lab 08/17/2020 1216  LABOPIA NONE DETECTED  COCAINSCRNUR NONE DETECTED  LABBENZ NONE DETECTED  AMPHETMU NONE DETECTED  THCU NONE DETECTED  LABBARB NONE DETECTED    Alcohol Level  Recent Labs  Lab 08/13/2020 1216  ETH <10    IMAGING past 24 hours DG Abd 1 View  Result Date: 08/20/2020 CLINICAL DATA:  Feeding tube placement. EXAM: ABDOMEN - 1 VIEW COMPARISON:  Chest radiograph 08/23/2020 FINDINGS: Imaging focused on the upper abdomen and  lower chest. There is new volume loss in the left hemithorax with opacity at the left lung base and elevated hemidiaphragm. The enteric tube tip and side port are below the left hemidiaphragm in the left upper quadrant. Progressive hazy opacity at the right lung base. Patient is significantly rotated. IMPRESSION: 1. Enteric tube tip and side port below the left hemidiaphragm in the left upper quadrant. 2. New volume loss in the left hemithorax with opacity at the left lung base, this may be due to mucous plugging and/or aspiration. 3. Progressive hazy opacity at the right lung base, may represent atelectasis, airspace disease, pleural fluid or combination there of. Electronically Signed   By: Keith Rake M.D.   On: 08/20/2020 17:06    Neuro Interventional Radiology - Cerebral Angiogram with Intervention - Dr Estanislado Pandy 09/04/2020 5:25 PM Findings. 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 2.Delated partial reconstitution of Lt MCA from Lt ACA collaterals retrogradely. Unsuccessful attempts at  revascularization due to severe prox  Lt ICA tortuosity and of the aortic arch.  Transthoracic Echocardiogram  08/11/2020 IMPRESSIONS  1. Hyperdynamic LV function; proximal septal thickening with systolic  anterior motion of MV; LVOT velocity of 2.5 m/s that increased to 3.5 m/s;  findings c/w HOCM.  2. Left ventricular ejection fraction, by estimation, is >75%. The left  ventricle has hyperdynamic function. The left ventricle has no regional  wall motion abnormalities. There is mild left ventricular hypertrophy.  Left ventricular diastolic parameters  are consistent with Grade I diastolic dysfunction (impaired relaxation).  3. Right ventricular systolic function is normal. The right ventricular  size is normal.  4. The mitral valve is normal in structure. No evidence of mitral valve  regurgitation. No evidence of mitral stenosis.  5. The aortic valve has an indeterminant number of cusps.  Aortic valve  regurgitation is not visualized. No aortic stenosis is present.  6. The inferior vena cava is normal in size with greater than 50%  respiratory variability, suggesting right atrial pressure of 3 mmHg.   PHYSICAL EXAM   Temp:  [97.2 F (36.2 C)-97.9 F (36.6 C)] 97.9 F (36.6 C) (12/12 0400) Pulse Rate:  [58-99] 70 (12/12 0700) Resp:  [9-16] 10 (12/12 0700) BP: (138-173)/(52-99) 161/64 (12/12 0700) SpO2:  [91 %-100 %] 99 % (12/12 0700)  General - Well nourished, well developed, not in acute distress.  Ophthalmologic - fundi not visualized due to noncooperation.  Cardiovascular - Regular rhythm and rate.  Neuro - eyes open, awake, severe dysarthria, intangible words, attempted to talk but very soft voice. Followed midline commands today but not peripheral commands. Eyes midline, able to gaze bilaterally, tracking bilaterally, however, blinking to visual threat on the left but not on the right. Right nasolabial fold flattening, right eye closure weaker than left. LUE at least 3/5 without drift and BLE slight withdraw to pain. RUE flaccid. Bilaterally babinski positive.   ASSESSMENT/PLAN Ms. MAECY PODGURSKI is a 86 y.o. female with history of previous stroke, HTN, HLD, initially admitted for acute deconditioning, gen weakness post anemia/gastritis. She was an in-house code stroke when staff noted a change in mentation. CTA showed Left M2 occlusion for which she had emergent thrombectomy. Had IR - 12/9 5:25 PM - Occluded Lt MCA inf division in the mid M2 seg. Delated partial reconstitution of Lt MCA from Lt ACA collaterals retrogradely. Unsuccessful attempts at  revascularization due to severe prox  Lt ICA tortuosity and of the aortic arch.  Stroke: LMCA moderate stroke due to left M2 occlusion with unsuccessful IR and in TIMELESS trial, likely cardioembolic etiology, source under investigation   Code Stroke CT head: hyperdensity along the left M2 MCA in the sylvian  fissure  CTA head & neck Distal branch of M2 occlusion.   CT perfusion 61ml with missmatch  MRI  Multiple scattered tiny strokes bilat, moderate LMCA stroke, small amt of petechial hemorrhage noted in bed of infarct. Small vessel ischemic disease and old lacunar strokes noted.   Repeat CTA unchanged left M2 occlusion  2D Echo -08/11/2020 - EF > 75%. No cardiac source of emboli identified.   LDL 50  HgbA1c 5.6  UDS - negative  SARS - negative  VTE prophylaxis - post TIMELESS trial, hold for possible IV thrombolytic administration - SCDs  ASA 81mg  (but this was recently put on hold by GI for acute gastritis and anemia) prior to admission, now on ASA 325 mg daily  Therapy recommendations:  SNF  Disposition:  pending  Recent GIB with anemia  12/2 admission for syncope, GIB  Found to have severe anemia with Hb 6.9  Received PRBC   EGD done showed NSAID related gastritis and gastric ulcers and Cameron's erosions. Per GI at that time, hold ASA for2 weeks from admission (restart 08/24/20), continue BID PPI for 4 months, start PO iron in 1 week.  This admission Hb 10.2->...->7.5->8.2  CBC monitoring  If Hb < 7.0, will need PRBC  Iron resumed  PPI IV bid  AKI on CKD  Cre 1.12->1.07->1.38->2.54->3.56  On IVF - NS @ 50-> LR @ 50  Decreased urine output  Nephrology consulted   On TF with Glucerna @ 40 ml / hour and Free water 100 ml Q 4 hrs  BMP monitoring  Hypertension  Home meds:  Toprol XL 25 mg daily, micardis 20 mg, Lasix 40mg    Current BP meds - metoprolol and PRN Cleviprex   Stable . Long-term BP goal 130-150 given left M2 occlusion  Hyperlipidemia  Home meds:  Lipitor 40mg  and Zetia 10mg    Home meds resumed   LDL 50, goal < 70  Continue statin at discharge  Diabetes type II Controlled  Home meds:  Insulin  Now - SSI  HgbA1c 5.6, goal < 7.0  CBG monitoring  Avoid hypoglycemia  Dysphagia   Did not pass swallow  NPO  Had  NGT  On IVF, TF and FW  Speech on board  Other Stroke Risk Factors  Advanced Age >/= 80   Coronary artery disease  Congestive heart failure  Other Active Problems and Findings  Code status - Full code  Incidental finding to f/u on as out pt: Multiple thyroid nodules, the largest within the left lobe measuring 2.3 cm.   Pt was enrolled in TIMELESS trial prior to thrombectomy.  Small right frontal scalp hematoma   Rt groin hematoma -   Soft stool x 2 on 08/21/20   Hospital day # 3  This patient is critically ill due to unsuccessful thrombectomy, left MCA stroke, left MCA occlusion, AKI on CKD, anemia, dysphagia, history of GI bleeding and at significant risk of neurological worsening, death form renal failure, recurrent stroke, hemorrhagic conversion, aspiration, shock, heart failure, seizure. This patient's care requires constant monitoring of vital signs, hemodynamics, respiratory and cardiac monitoring, review of multiple databases, neurological assessment, discussion with family, other specialists and medical decision making of high complexity. I spent 35 minutes of neurocritical care time in the care of this patient.  Rosalin Hawking, MD PhD Stroke Neurology 08/21/2020 7:14 PM   To contact Stroke Continuity provider, please refer to [email or web address] After hours, contact General Neurology

## 2020-08-21 NOTE — Research (Signed)
NIH Stroke Scale  Level Of Consciousness 0=Alert; keenly responsive 1=Arouse to minor stimulation 2=Requires repeated stimulation to arouse or movements to pain 3=postures or unresponsive 1  LOC Questions to Month and Age 86=Answers both questions correctly 1=Answers one question correctly or dysarthria/intubated/trauma/language barrier 2=Answers neither question correctly or aphasia 2  LOC Commands      -Open/Close eyes     -Open/close grip     -Pantomime commands if communication barrier 0=Performs both tasks correctly 1=Performs one task correctly 2=Performs neighter task correctly 1  Best Gaze     -Only assess horizontal gaze 0=Normal 1=Partial gaze palsy 2=Forced deviation, or total gaze paresis 0  Visual 0=No visual loss 1=Partial hemianopia 2=Complete hemianopia 3=Bilateral hemianopia (blind including cortical blindness) 2  Facial Palsy     -Use grimace if obtunded 0=Normal symmetrical movement 1=Minor paralysis (asymmetry) 2=Partial paralysis (lower face) 3=Complete paralysis (upper and lower face) 1  Motor  0=No drift for 10/5 seconds 1=Drift, but does not hit bed 2=Some antigravity effort, hits  bed 3=No effort against gravity, limb falls 4=No movement 0=Amputation/joint fusion Right Arm 4     Leg 3    Left Arm 0     Leg 3  Limb Ataxia     - FNT/HTS 0=Absent or does not understand or paralyzed or amputation/joint fusion 1=Present in one limb 2=Present in two limbs 0  Sensory 0=Normal 1=Mild to moderate sensory loss 2=Severe to total sensory loss or coma/unresponsive 1  Best Language 0=No aphasia, normal 1=Mild to moderate aphasia 2=Severe aphasia 3=Mute, global aphasia, or coma/unresponsive 2  Dysarthria 0=Normal 1=Mild to moderate 2=Severe, unintelligible or mute/anarthric 0=intubated/unable to test 2  Extinction/Neglect 0=No abnormality 1=visual/tactile/auditory/spatia/personal inattention/Extinction to bilateral simultaneous stimulation 2=Profound  neglect/extinction more than 1 modality  2  Total   24

## 2020-08-21 NOTE — Progress Notes (Signed)
  Speech Language Pathology Treatment: Dysphagia  Patient Details Name: Maria [name] MRN: [id number] DOB: 1925/10/13 Today's Date: 08/21/2020 Time: 1550-1610 SLP Time Calculation (min) (ACUTE ONLY): 20 min  Assessment / Plan / Recommendation Clinical Impression  Patient seen by SLP for PO trials with ice chips and small bites of gelatin. SLP completed oral care and aside from dry secretions on lips, oral mucosa was clean and moist. Patient accepted ice chips and did exhibit some mastication and pharyngeal swallow. During swallow, patient appeared to have to put a lot of effort into completing (NG tube in place). She kept gelatin in anterior portion of mouth and despite attempts, was not able to manage and so SLP suctioned out majority. Patient exhibited delayed and prolonged coughing with some gagging response but no emesis. After a few minutes of this, patient then was calm and no further overt s/s of aspiration or penetration were observed. SLP to continue to follow patient for readiness for objective swallow study.    HPI HPI: Maria [name] is a 86 y.o. with a history of previous stroke, hypertension, DM, hyperlipidemia who was recently admitted after syncopal episode and found to have anemia with gastritis and erosions (started on PPI).  Developed with right-sided weakness and aphasia. Found to have cerebral infarction due to occlusion or stenosis of left middle cerebral artery. CXR clear. No prior ST notes. She had neuro change on 12/11 with MRI pending      SLP Plan  Continue with current plan of care       Recommendations  Diet recommendations: NPO Medication Administration: Via alternative means                Oral Care Recommendations: Oral care QID Follow up Recommendations: Skilled Nursing facility SLP Visit Diagnosis: Dysphagia, unspecified (R13.10) Plan: Continue with current plan of care       GO               Sonia Baller, MA, [facility]

## 2020-08-22 ENCOUNTER — Inpatient Hospital Stay (HOSPITAL_COMMUNITY): Payer: Medicare Other

## 2020-08-22 DIAGNOSIS — Z8719 Personal history of other diseases of the digestive system: Secondary | ICD-10-CM

## 2020-08-22 DIAGNOSIS — E7841 Elevated Lipoprotein(a): Secondary | ICD-10-CM

## 2020-08-22 DIAGNOSIS — D500 Iron deficiency anemia secondary to blood loss (chronic): Secondary | ICD-10-CM

## 2020-08-22 DIAGNOSIS — N1832 Chronic kidney disease, stage 3b: Secondary | ICD-10-CM

## 2020-08-22 DIAGNOSIS — I160 Hypertensive urgency: Secondary | ICD-10-CM

## 2020-08-22 LAB — CBC
HCT: 27.7 % — ABNORMAL LOW (ref 36.0–46.0)
Hemoglobin: 8.5 g/dL — ABNORMAL LOW (ref 12.0–15.0)
MCH: 27.9 pg (ref 26.0–34.0)
MCHC: 30.7 g/dL (ref 30.0–36.0)
MCV: 90.8 fL (ref 80.0–100.0)
Platelets: 248 10*3/uL (ref 150–400)
RBC: 3.05 MIL/uL — ABNORMAL LOW (ref 3.87–5.11)
RDW: 19 % — ABNORMAL HIGH (ref 11.5–15.5)
WBC: 8.7 10*3/uL (ref 4.0–10.5)
nRBC: 0 % (ref 0.0–0.2)

## 2020-08-22 LAB — URINE CULTURE

## 2020-08-22 LAB — GLUCOSE, CAPILLARY
Glucose-Capillary: 168 mg/dL — ABNORMAL HIGH (ref 70–99)
Glucose-Capillary: 123 mg/dL — ABNORMAL HIGH (ref 70–99)
Glucose-Capillary: 167 mg/dL — ABNORMAL HIGH (ref 70–99)
Glucose-Capillary: 147 mg/dL — ABNORMAL HIGH (ref 70–99)
Glucose-Capillary: 134 mg/dL — ABNORMAL HIGH (ref 70–99)
Glucose-Capillary: 135 mg/dL — ABNORMAL HIGH (ref 70–99)

## 2020-08-22 LAB — BASIC METABOLIC PANEL
Anion gap: 13 (ref 5–15)
BUN: 58 mg/dL — ABNORMAL HIGH (ref 8–23)
CO2: 20 mmol/L — ABNORMAL LOW (ref 22–32)
Calcium: 7.8 mg/dL — ABNORMAL LOW (ref 8.9–10.3)
Chloride: 107 mmol/L (ref 98–111)
Creatinine, Ser: 4.43 mg/dL — ABNORMAL HIGH (ref 0.44–1.00)
GFR, Estimated: 9 mL/min — ABNORMAL LOW (ref >60)
Glucose, Bld: 131 mg/dL — ABNORMAL HIGH (ref 70–99)
Potassium: 4.1 mmol/L (ref 3.5–5.1)
Sodium: 140 mmol/L (ref 135–145)

## 2020-08-22 LAB — MAGNESIUM: Magnesium: 1.9 mg/dL (ref 1.7–2.4)

## 2020-08-22 LAB — PHOSPHORUS: Phosphorus: 4.8 mg/dL — ABNORMAL HIGH (ref 2.5–4.6)

## 2020-08-22 MED ORDER — METOPROLOL TARTRATE 25 MG PO TABS
25.0000 mg | ORAL_TABLET | Freq: Two times a day (BID) | ORAL | Status: DC
  Administered 2020-08-23 – 2020-08-24 (×4): 25 mg
  Filled 2020-08-22 (×5): qty 1

## 2020-08-22 MED ORDER — GLUCERNA 1.5 CAL PO LIQD
1000.0000 mL | ORAL | Status: DC
  Administered 2020-08-22 – 2020-09-01 (×8): 1000 mL
  Filled 2020-08-22 (×15): qty 1000

## 2020-08-22 NOTE — Progress Notes (Signed)
Initial Nutrition Assessment  DOCUMENTATION CODES:   Not applicable  INTERVENTION:   Tube feeding via NG tube: Increase Glucerna 1.5 to 45 ml/h (1080 ml per day) Prosource TF 45 ml BID  Provides 1700 kcal, 111 gm protein, 820 ml free water daily  100 ml free water every 4 hours Total free water: 1420 ml   NUTRITION DIAGNOSIS:   Inadequate oral intake related to inability to eat as evidenced by NPO status.  GOAL:   Patient will meet greater than or equal to 90% of their needs  MONITOR:   Diet advancement,TF tolerance  REASON FOR ASSESSMENT:   Consult Enteral/tube feeding initiation and management  ASSESSMENT:   Pt with PMH of CKD, HTN, gout, dementia, DM, HLD, and previous stroke admitted to ED on 12/7 for acute deconditioning and SNF placement.   Pt discussed during ICU rounds and with RN.  Pt with difficulty speaking. Friend in room and provides some history. Per friend pt with no recent weight changes but hasn't had the best appetite. Pt lives alone, eats 3 meals per day but doesn't eat much.    12/9 Pt with right sided weakness and aphasia; per CTA pt with L MCA stroke due to L M2 occlusion with unsuccessful IR  12/11 failed swallow evaluation; NG tube placed 12/13 failed MBS, plan to change NG to cortrak   Medications reviewed and include: ferrous sulfate, foltx, SSI Cleviprex @ 4 ml/hr provides: 105 kcal  Labs reviewed: PO4: 4.8 CBG's: [phone]  16 F NG tube; tip gastric  Current TF: Glucerna 1.5 @ 40 ml/hr with 45 ml ProSource TF BID Provides: 1520 kcal and 101 grams protein  NUTRITION - FOCUSED PHYSICAL EXAM:  Flowsheet Row Most Recent Value  Orbital Region No depletion  Upper Arm Region No depletion  Thoracic and Lumbar Region No depletion  Buccal Region No depletion  Temple Region Mild depletion  Clavicle Bone Region Mild depletion  Clavicle and Acromion Bone Region Moderate depletion  Scapular Bone Region Unable to assess  Dorsal Hand No  depletion  Patellar Region No depletion  Anterior Thigh Region No depletion  Posterior Calf Region No depletion  Edema (RD Assessment) Moderate  Hair Reviewed  Eyes Reviewed  Mouth Reviewed  Skin Reviewed  Nails Reviewed       Diet Order:   Diet Order            Diet NPO time specified  Diet effective now                 EDUCATION NEEDS:   No education needs have been identified at this time  Skin:  Skin Assessment: Skin Integrity Issues: Skin Integrity Issues:: DTI DTI: sacrum  Last BM:  12/13  Height:   Ht Readings from Last 1 Encounters:  08/19/2020 4\' 11"  (1.499 m)    Weight:   Wt Readings from Last 1 Encounters:  08/22/20 74 kg    Ideal Body Weight:  44.6 kg  BMI:  Body mass index is 32.95 kg/m.  Estimated Nutritional Needs:   Kcal:  1550-1700  Protein:  100-115 grams  Fluid:  >1.5 L/day  Lockie Pares., RD, LDN, CNSC See [facility] for contact information

## 2020-08-22 NOTE — Progress Notes (Signed)
STROKE TEAM PROGRESS NOTE   INTERVAL HISTORY No family at the bedside. Pt lying in bed, eyes open, more awake alert than yesterday, tried to lower her bed, I initially up the bed, she moans and shook her head. I then lowered the bed, she felt comfortable. She still has global aphasia and right side hemiplegia, right facial droop and tongue protrusion to the right. Cre up to 4.43, nephrology on board, urine output still low.   Vitals:   08/22/20 0500 08/22/20 0600 08/22/20 0700 08/22/20 0800  BP: (!) 145/59 (!) 155/69 (!) 150/71   Pulse: 80 80 84   Resp: 15 13 13    Temp:    98.5 F (36.9 C)  TempSrc:    Oral  SpO2: 100% 100% 100%   Weight: 74 kg     Height:       CBC:  Recent Labs  Lab 08/19/20 0529 08/19/20 1200 08/20/20 0435 08/21/20 0442 08/22/20 0541  WBC 8.3  --  7.3 8.9 8.7  NEUTROABS 6.7  --  5.8  --   --   HGB 8.6*   < > 7.5* 8.2* 8.5*  HCT 28.5*   < > 23.9* 27.0* 27.7*  MCV 91.1  --  91.9 91.5 90.8  PLT 224  --  196 216 248   < > = values in this interval not displayed.   Basic Metabolic Panel:  Recent Labs  Lab 08/21/20 0442 08/21/20 1700 08/22/20 0541  NA 141  --  140  K 3.9  --  4.1  CL 110  --  107  CO2 19*  --  20*  GLUCOSE 118*  --  131*  BUN 44*  --  58*  CREATININE 3.56*  --  4.43*  CALCIUM 8.1*  --  7.8*  MG 1.8 1.9 1.9  PHOS 6.1* 5.5* 4.8*   Lipid Panel:  Recent Labs  Lab 08/19/20 0529  CHOL 104  TRIG 76  HDL 39*  CHOLHDL 2.7  VLDL 15  LDLCALC 50   HgbA1c:  Recent Labs  Lab 08/19/20 0528  HGBA1C 5.6   Urine Drug Screen:  Recent Labs  Lab 09/06/2020 1216  LABOPIA NONE DETECTED  COCAINSCRNUR NONE DETECTED  LABBENZ NONE DETECTED  AMPHETMU NONE DETECTED  THCU NONE DETECTED  LABBARB NONE DETECTED    Alcohol Level  Recent Labs  Lab 08/21/2020 1216  ETH <10    IMAGING past 24 hours MR BRAIN WO CONTRAST  Result Date: 08/21/2020 CLINICAL DATA:  Follow-up examination for acute stroke. EXAM: MRI HEAD WITHOUT CONTRAST  TECHNIQUE: Multiplanar, multiecho pulse sequences of the brain and surrounding structures were obtained without intravenous contrast. COMPARISON:  Prior CTs from 08/19/2020. FINDINGS: Brain: Moderately advanced cerebral atrophy and chronic microvascular ischemic disease again noted, stable. Scattered remote lacunar infarcts about the bilateral basal ganglia/internal capsules again noted. Few additional remote lacunar infarcts about the midbrain, with additional scattered remote bilateral cerebellar infarcts again noted. Continued interval evolution of moderate-sized left MCA distribution infarct involving the mid and posterior left frontal region. Associated diffusion abnormality is somewhat increased and more confluent in appearance as compared to previous, with slightly increased size. Evidence for faint petechial hemorrhage at the infarct bed without hemorrhagic transformation or significant regional mass effect (series 14, image 28). Multiple additional scattered predominantly cortical and subcortical ischemic infarcts again noted involving the bilateral cerebral hemispheres, as well as the right greater than left cerebellum involvement of the pre and postcentral gyri again noted bilaterally. Overall number and distribution of  these additional infarcts is not felt to be significantly changed or progressed. Possible single focus of associated petechial hemorrhage at the left parietal lobe (series 14, image 33). No other evidence for associated hemorrhage or significant mass effect. Few additional scattered chronic micro hemorrhages noted within the brain and cerebellum, otherwise stable. No mass lesion, midline shift or mass effect. Ventricular size is stable without hydrocephalus. No extra-axial fluid collection. Pituitary gland suprasellar region normal. Midline structures intact. Vascular: Focal T1 hyperintensity and susceptibility artifact seen involving a proximal left M2 branch, consistent with major  intracranial vascular flow voids are otherwise maintained. Thrombus (series 14, image 23). Skull and upper cervical spine: Craniocervical junction within normal limits. Bone marrow signal intensity within normal limits. Interval evolution of previously identified right frontal scalp contusion, decreased in prominence from previous. Sinuses/Orbits: Sequelae of prior bilateral ocular lens replacement. Globes and orbital soft tissues demonstrate no acute finding. Chronic mucosal thickening noted within the sphenoethmoidal sinuses. Paranasal sinuses are otherwise clear. Mild to moderate bilateral mastoid effusions, slightly increased in prominence from previous. Inner ear structures within normal limits. Other: None. IMPRESSION: 1. Continued interval evolution of moderate-sized early subacute left MCA distribution infarct involving the mid and posterior left frontal region. Evidence for faint petechial hemorrhage at the infarct bed without hemorrhagic transformation or significant regional mass effect. 2. Multiple additional scattered predominantly cortical and subcortical early subacute ischemic infarcts involving the bilateral cerebral hemispheres and cerebellum, not significantly changed or progressed from previous. 3. Focal T1 hyperintensity and susceptibility artifact involving a proximal left M2 branch, consistent with previously identified intraluminal thrombus. 4. Underlying moderately advanced cerebral atrophy with chronic microvascular ischemic disease with remote ischemic changes as above, stable. Electronically Signed   By: Jeannine Boga M.D.   On: 08/21/2020 21:29   US RENAL  Addendum Date: 08/21/2020   ADDENDUM REPORT: 08/21/2020 15:14 IMPRESSION: Echogenic focus in the right kidney is nonspecific but could represent a small nonobstructing renal stone. Electronically Signed   By: Audie Pinto M.D.   On: 08/21/2020 15:14   Result Date: 08/21/2020 CLINICAL DATA:  Acute kidney injury EXAM:  RENAL / URINARY TRACT ULTRASOUND COMPLETE COMPARISON:  Renal ultrasound 08/10/2020 FINDINGS: Right Kidney: Renal measurements: 10.0 x 5.1 x 5.5 cm = volume: 146 mL. Mild increased echogenicity. There is an echogenic focus in the superior aspect of the kidney measuring 0.4 cm which could represent a small calculus. There is a 1.0 cm cyst. No hydronephrosis. Left Kidney: Renal measurements: 11.1 x 5.6 x 6.6 cm = volume: 212 mL. Mildly increased echogenicity. No hydronephrosis. There are 2 cysts measuring 2.3 cm and 1.0 cm. Bladder: Poorly visualized, possibly decompressed. Other: None. IMPRESSION: No acute finding in the bilateral kidneys. Increased echogenicity bilaterally as can be seen in medical renal disease. Electronically Signed: By: Audie Pinto M.D. On: 08/21/2020 15:08    Neuro Interventional Radiology - Cerebral Angiogram with Intervention - Dr Estanislado Pandy 08/27/2020 5:25 PM Findings. 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 2.Delated partial reconstitution of Lt MCA from Lt ACA collaterals retrogradely. Unsuccessful attempts at revascularization due to severe prox  Lt ICA tortuosity and of the aortic arch.  Transthoracic Echocardiogram  08/11/2020 IMPRESSIONS  1. Hyperdynamic LV function; proximal septal thickening with systolic anterior motion of MV; LVOT velocity of 2.5 m/s that increased to 3.5 m/s; findings c/w HOCM.  2. Left ventricular ejection fraction, by estimation, is >75%. The left ventricle has hyperdynamic function. The left ventricle has no regional wall motion abnormalities. There  is mild left ventricular hypertrophy. Left ventricular diastolic parameters are consistent with Grade I diastolic dysfunction (impaired relaxation).  3. Right ventricular systolic function is normal. The right ventricular size is normal.  4. The mitral valve is normal in structure. No evidence of mitral valve regurgitation. No evidence of mitral stenosis.  5. The aortic valve has an  indeterminant number of cusps. Aortic valve regurgitation is not visualized. No aortic stenosis is present.  6. The inferior vena cava is normal in size with greater than 50% respiratory variability, suggesting right atrial pressure of 3 mmHg.   PHYSICAL EXAM   Temp:  [97.8 F (36.6 C)-98.8 F (37.1 C)] 98.5 F (36.9 C) (12/13 0800) Pulse Rate:  [74-99] 84 (12/13 0700) Resp:  [12-21] 13 (12/13 0700) BP: (133-177)/(58-87) 150/71 (12/13 0700) SpO2:  [97 %-100 %] 100 % (12/13 0700) Weight:  [74 kg] 74 kg (12/13 0500)  General - Well nourished, well developed, not in acute distress.  Ophthalmologic - fundi not visualized due to noncooperation.  Cardiovascular - Regular rhythm and rate.  Neuro - eyes open, awake, alert, severe dysarthria, intangible words. Not able to answer orientation question, followed midline commands but not peripheral commands. Eyes midline, able to gaze bilaterally, tracking bilaterally, however, blinking to visual threat on the left but not on the right. Right facial droop, right eye closure weaker than left. Tongue protrusion to the right. LUE at least 3/5 without drift and BLE slight withdraw to pain. RUE flaccid. Bilaterally babinski positive.   ASSESSMENT/PLAN Ms. TOMIKA ECKLES is a 86 y.o. female with history of previous stroke, HTN, HLD, initially admitted for acute deconditioning, gen weakness post anemia/gastritis. She was an in-house code stroke when staff noted a change in mentation. CTA showed Left M2 occlusion for which she had emergent thrombectomy. Had IR - 12/9 5:25 PM - Occluded Lt MCA inf division in the mid M2 seg. Delated partial reconstitution of Lt MCA from Lt ACA collaterals retrogradely. Unsuccessful attempts at  revascularization due to severe prox  Lt ICA tortuosity and of the aortic arch.  Stroke: L MCA moderate stroke due to left M2 occlusion with unsuccessful IR and in TIMELESS trial, likely cardioembolic etiology  Code Stroke CT head:  hyperdensity along the left M2 MCA in the sylvian fissure  CTA head & neck Distal branch of M2 occlusion.   CT perfusion 2ml with missmatch  MRI  Multiple scattered tiny strokes bilat, moderate LMCA stroke, small amt of petechial hemorrhage noted in bed of infarct. Small vessel ischemic disease and old lacunar strokes noted.   Repeat CTA unchanged left M2 occlusion  2D Echo -08/11/2020 - EF > 75%. No cardiac source of emboli identified.   LDL 50  HgbA1c 5.6  UDS - negative  SARS - negative  VTE prophylaxis - post TIMELESS trial, hold for possible IV thrombolytic administration - SCDs  ASA 81mg  (but this was recently put on hold by GI for acute gastritis and anemia) prior to admission, now on ASA 325 mg daily  Therapy recommendations:  SNF  Disposition:  pending  Recent GIB with anemia  12/2 admission for syncope, GIB  Found to have severe anemia with Hb 6.9  Received PRBC   EGD done showed NSAID related gastritis and gastric ulcers and Cameron's erosions. Per GI at that time, hold ASA for2 weeks from admission (restart 08/24/20), continue BID PPI for 4 months, start PO iron in 1 week.  This admission Hb 10.2->...->7.5->8.2->8.5  CBC monitoring  Iron resumed  PPI IV bid  AKI on CKD  Cre 1.12->1.07->1.38->2.54->3.56->4.43  On IVF - NS @ 50-> LR @ 50->off   Decreased urine output  Nephrology onboard, appreciate help  On TF with Glucerna @ 40 and Free water 100 ml Q 4 hrs  BMP monitoring  Hypertension  Home meds:  Toprol XL 25 mg daily, micardis 20 mg, Lasix 40mg    Off cleviprex  Current BP meds - metoprolol 12.5->25 bid  Stable on the high end . Long-term BP goal 130-150 given left M2 occlusion  Hyperlipidemia  Home meds:  Lipitor 40mg  and Zetia 10mg    Home meds resumed   LDL 50, goal < 70  Continue statin at discharge  Diabetes type II Controlled  Home meds:  Insulin  Now - SSI  HgbA1c 5.6, goal < 7.0  CBG monitoring  Avoid  hypoglycemia  Dysphagia   Did not pass swallow or MBS  NPO  NGT -> cortrak  On TF @ 40 and FW  Speech on board  Other Stroke Risk Factors  Advanced Age >/= 7   Coronary artery disease  Congestive heart failure  Other Active Problems and Findings  Code status - Full code  Incidental finding to f/u on as out pt: Multiple thyroid nodules, the largest within the left lobe measuring 2.3 cm.   Pt was enrolled in TIMELESS trial prior to thrombectomy.  Small right frontal scalp hematoma   Rt groin hematoma -   Soft stool x 2 on 08/21/20   Hospital day # 4  This patient is critically ill due to left MCA stroke status post TPA, unsuccessful thrombectomy, AKI on CKD, GI bleeding, anemia, hypertensive urgency, dysphagia and at significant risk of neurological worsening, death form recurrent stroke, hemorrhagic conversion, renal failure, heart failure, aspiration, shock, hypertensive encephalopathy. This patient's care requires constant monitoring of vital signs, hemodynamics, respiratory and cardiac monitoring, review of multiple databases, neurological assessment, discussion with family, other specialists and medical decision making of high complexity. I spent 45 minutes of neurocritical care time in the care of this patient. I had long discussion with granddaughter over the phone, updated pt current condition, treatment plan and potential prognosis, and answered all the questions.  She expressed understanding and appreciation.   Rosalin Hawking, MD PhD Stroke Neurology 08/22/2020 10:20 PM   To contact Stroke Continuity provider, please refer to [email or web address] After hours, contact General Neurology

## 2020-08-22 NOTE — Progress Notes (Signed)
Patient ID: Maria [name], female   DOB: [birth year], 86 y.o.   MRN: [id number] [facility] Progress Note   Assessment/ Plan:   1. Acute kidney Injury: This is suspected likely to be from contrast-induced nephropathy given the timeline of events. Unfortunately anuric overnight with worsening BUN/creatinine but without any acute indications for dialysis at this time. Overall given her baseline functional status/dementia and current worsening imposed by CVA, would be hesitant offering renal replacement therapy as this would worsen her quality of life. She is currently on LR at 50 cc an hour as maintenance fluid; given the concomitant administration of tube feeds and anuric state-we will discontinue this. 2. Hypertension: Blood pressures currently managed with metoprolol and clevidipine per neurology targets to limit extension of penumbra/infarct. 3. 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 4. Anemia: We will check iron stores/studies with next labs. At the current blood pressures+, will avoid ESA. 5. [place] metabolic acidosis: Bicarbonate level within acceptable range and without any respiratory compromise. Will follow to decide on need for medical management.  Subjective:   Overnight had in and out catheterization yielding pus-like urine; culture sent.   Objective:   BP (!) 167/66   Pulse 93   Temp 98.5 F (36.9 C) (Oral)   Resp (!) 25   Ht 4\' 11"  (1.499 m)   Wt 74 kg   SpO2 100%   BMI 32.95 kg/m   Intake/Output Summary (Last 24 hours) at 08/22/2020 0948 Last data filed at 08/22/2020 0900 Gross per 24 hour  Intake 2932.39 ml  Output --  Net 2932.39 ml   Weight change:   Physical Exam: Gen: Appears comfortable resting in bed, nurse at bedside. NGT in situ. Expressive aphasia/dysphonia noted CVS: Pulse regular rhythm, normal rate, S1 and S2  normal Resp: Clear to auscultation bilaterally, no distinct rales or rhonchi Abd: Soft, flat, nontender, bowel sounds normal Ext: No lower extremity edema noted  Imaging: DG Abd 1 View  Result Date: 08/20/2020 CLINICAL DATA:  Feeding tube placement. EXAM: ABDOMEN - 1 VIEW COMPARISON:  Chest radiograph 09/01/2020 FINDINGS: Imaging focused on the upper abdomen and lower chest. There is new volume loss in the left hemithorax with opacity at the left lung base and elevated hemidiaphragm. The enteric tube tip and side port are below the left hemidiaphragm in the left upper quadrant. Progressive hazy opacity at the right lung base. Patient is significantly rotated. IMPRESSION: 1. Enteric tube tip and side port below the left hemidiaphragm in the left upper quadrant. 2. New volume loss in the left hemithorax with opacity at the left lung base, this may be due to mucous plugging and/or aspiration. 3. Progressive hazy opacity at the right lung base, may represent atelectasis, airspace disease, pleural fluid or combination there of. Electronically Signed   By: Keith Rake M.D.   On: 08/20/2020 17:06   MR BRAIN WO CONTRAST  Result Date: 08/21/2020 CLINICAL DATA:  Follow-up examination for acute stroke. EXAM: MRI HEAD WITHOUT CONTRAST TECHNIQUE: Multiplanar, multiecho pulse sequences of the brain and surrounding structures were obtained without intravenous contrast. COMPARISON:  Prior CTs from 08/19/2020. FINDINGS: Brain: Moderately advanced cerebral atrophy and chronic microvascular ischemic disease again noted, stable. Scattered remote lacunar infarcts about the bilateral basal ganglia/internal capsules again noted. Few additional remote lacunar infarcts about the midbrain, with additional scattered remote bilateral cerebellar infarcts again noted. Continued interval evolution of moderate-sized left MCA distribution infarct involving  the mid and posterior left frontal region. Associated diffusion  abnormality is somewhat increased and more confluent in appearance as compared to previous, with slightly increased size. Evidence for faint petechial hemorrhage at the infarct bed without hemorrhagic transformation or significant regional mass effect (series 14, image 28). Multiple additional scattered predominantly cortical and subcortical ischemic infarcts again noted involving the bilateral cerebral hemispheres, as well as the right greater than left cerebellum involvement of the pre and postcentral gyri again noted bilaterally. Overall number and distribution of these additional infarcts is not felt to be significantly changed or progressed. Possible single focus of associated petechial hemorrhage at the left parietal lobe (series 14, image 33). No other evidence for associated hemorrhage or significant mass effect. Few additional scattered chronic micro hemorrhages noted within the brain and cerebellum, otherwise stable. No mass lesion, midline shift or mass effect. Ventricular size is stable without hydrocephalus. No extra-axial fluid collection. Pituitary gland suprasellar region normal. Midline structures intact. Vascular: Focal T1 hyperintensity and susceptibility artifact seen involving a proximal left M2 branch, consistent with major intracranial vascular flow voids are otherwise maintained. Thrombus (series 14, image 23). Skull and upper cervical spine: Craniocervical junction within normal limits. Bone marrow signal intensity within normal limits. Interval evolution of previously identified right frontal scalp contusion, decreased in prominence from previous. Sinuses/Orbits: Sequelae of prior bilateral ocular lens replacement. Globes and orbital soft tissues demonstrate no acute finding. Chronic mucosal thickening noted within the sphenoethmoidal sinuses. Paranasal sinuses are otherwise clear. Mild to moderate bilateral mastoid effusions, slightly increased in prominence from previous. Inner ear  structures within normal limits. Other: None. IMPRESSION: 1. Continued interval evolution of moderate-sized early subacute left MCA distribution infarct involving the mid and posterior left frontal region. Evidence for faint petechial hemorrhage at the infarct bed without hemorrhagic transformation or significant regional mass effect. 2. Multiple additional scattered predominantly cortical and subcortical early subacute ischemic infarcts involving the bilateral cerebral hemispheres and cerebellum, not significantly changed or progressed from previous. 3. Focal T1 hyperintensity and susceptibility artifact involving a proximal left M2 branch, consistent with previously identified intraluminal thrombus. 4. Underlying moderately advanced cerebral atrophy with chronic microvascular ischemic disease with remote ischemic changes as above, stable. Electronically Signed   By: Jeannine Boga M.D.   On: 08/21/2020 21:29   US RENAL  Addendum Date: 08/21/2020   ADDENDUM REPORT: 08/21/2020 15:14 IMPRESSION: Echogenic focus in the right kidney is nonspecific but could represent a small nonobstructing renal stone. Electronically Signed   By: Audie Pinto M.D.   On: 08/21/2020 15:14   Result Date: 08/21/2020 CLINICAL DATA:  Acute kidney injury EXAM: RENAL / URINARY TRACT ULTRASOUND COMPLETE COMPARISON:  Renal ultrasound 08/10/2020 FINDINGS: Right Kidney: Renal measurements: 10.0 x 5.1 x 5.5 cm = volume: 146 mL. Mild increased echogenicity. There is an echogenic focus in the superior aspect of the kidney measuring 0.4 cm which could represent a small calculus. There is a 1.0 cm cyst. No hydronephrosis. Left Kidney: Renal measurements: 11.1 x 5.6 x 6.6 cm = volume: 212 mL. Mildly increased echogenicity. No hydronephrosis. There are 2 cysts measuring 2.3 cm and 1.0 cm. Bladder: Poorly visualized, possibly decompressed. Other: None. IMPRESSION: No acute finding in the bilateral kidneys. Increased echogenicity  bilaterally as can be seen in medical renal disease. Electronically Signed: By: Audie Pinto M.D. On: 08/21/2020 15:08    Labs: BMET Recent Labs  Lab 09/01/2020 2321 08/15/2020 1216 08/21/2020 1226 08/19/20 0529 08/20/20 0435 08/20/20 1757 08/21/20 0442 08/21/20 1700 08/22/20 0541  NA 138 139 139 137 139  --  141  --  140  K 5.4* 3.6 3.6 3.5 3.7  --  3.9  --  4.1  CL 104 102 101 102 108  --  110  --  107  CO2 24 24  --  21* 20*  --  19*  --  20*  GLUCOSE 155* 115* 112* 92 105*  --  118*  --  131*  BUN 26* 30* 27* 27* 34*  --  44*  --  58*  CREATININE 1.65* 1.28* 1.30* 1.38* 2.54*  --  3.56*  --  4.43*  CALCIUM 9.1 8.9  --  8.2* 8.0*  --  8.1*  --  7.8*  PHOS  --   --   --   --   --  5.6* 6.1* 5.5* 4.8*   CBC Recent Labs  Lab 08/15/2020 2321 08/27/2020 1216 08/21/2020 1226 08/19/20 0529 08/19/20 1200 08/20/20 0025 08/20/20 0435 08/21/20 0442 08/22/20 0541  WBC 10.9* 9.2  --  8.3  --   --  7.3 8.9 8.7  NEUTROABS 8.9* 7.5  --  6.7  --   --  5.8  --   --   HGB 10.4* 10.2*   < > 8.6*   < > 7.5* 7.5* 8.2* 8.5*  HCT 34.4* 32.8*   < > 28.5*   < > 25.0* 23.9* 27.0* 27.7*  MCV 94.8 90.9  --  91.1  --   --  91.9 91.5 90.8  PLT 142* 196  --  224  --   --  196 216 248   < > = values in this interval not displayed.    Medications:    . allopurinol  200 mg Per Tube Daily  . aspirin  325 mg Per Tube Daily  . atorvastatin  40 mg Per Tube Daily  . Chlorhexidine Gluconate Cloth  6 each Topical Daily  . donepezil  10 mg Per Tube QPM  . ezetimibe  10 mg Per Tube QHS  . feeding supplement (PROSource TF)  45 mL Per Tube BID  . ferrous sulfate  300 mg Per Tube BID WC  . folic acid-pyridoxine-cyancobalamin  1 tablet Per Tube Daily  . free water  100 mL Per Tube Q4H  . insulin aspart  0-15 Units Subcutaneous Q4H  . mouth rinse  15 mL Mouth Rinse BID  . metoprolol tartrate  12.5 mg Per Tube BID  . pantoprazole sodium  40 mg Per Tube Daily   Elmarie Shiley, MD 08/22/2020, 9:48 AM

## 2020-08-22 NOTE — Progress Notes (Signed)
  Speech Language Pathology    Patient Details Name: Maria [name] MRN: [id number] DOB: [birth year] Today's Date: 08/22/2020 Time:  -     MBS scheduled today at [name], [name] 08/22/2020, 11:17 AM    [name].Ed Risk analyst [phone] Office [phone]

## 2020-08-22 NOTE — Progress Notes (Signed)
Modified Barium Swallow Progress Note  Patient Details  Name: MILINDA SWEENEY MRN: [id number] Date of Birth: 09/16/1925  Today's Date: 08/22/2020  Modified Barium Swallow completed.  Full report located under Chart Review in the Imaging Section.  Brief recommendations include the following:  Clinical Impression  Oropharyngeal dysphagia is mild-moderate and marked by oral transit delays, residue, reduced propulsion and mistimed swallow onset. Combination of reduced lingual ROM/sensation led to significantly delayed transit to pharynx up to 3 minutes. Excessive right bucal cavity retention and anterior spillage without awareness. All trials reached her valleculae and sat for greater than 8-10 seconds before swallow initiated. Nectar thick was penetrated to her vocal cords, without response and almost exited vestibule but slight remained at petiole of epiglottis. Mild-mod vallecular and pyriform sinus residue with honey thick without penetration/aspiration. Given delays in oral and pharyngeal transit she would likely be unable to maintain nurtitional needs. Recommend continue NPO and therapist willl continue intervention.   Swallow Evaluation Recommendations       SLP Diet Recommendations: NPO       Medication Administration: Via alternative means               Oral Care Recommendations: Oral care QID        [name] 08/22/2020,2:59 PM  [name] Stov[place].Ed Risk analyst [phone] Office [phone]

## 2020-08-22 NOTE — Procedures (Signed)
Cortrak ° °Tube Type:  Cortrak - 43 inches °Tube Location:  Left nare °Initial Placement:  Stomach °Secured by: Bridle °Technique Used to Measure Tube Placement:  Documented cm marking at nare/ corner of mouth °Cortrak Secured At:  70 cm ° ° ° °Cortrak Tube Team Note: ° °Consult received to place a Cortrak feeding tube.  ° °No x-ray is required. RN may begin using tube.  ° °If the tube becomes dislodged please keep the tube and contact the Cortrak team at [email or web address] (password [email or web address]) for replacement.  °If after hours and replacement cannot be delayed, place a NG tube and confirm placement with an abdominal x-ray.  ° ° °[place] Alexa MS, RD, LDN °Please refer to AMION for RD and/or RD on-call/weekend/after hours pager ° °

## 2020-08-22 NOTE — Progress Notes (Signed)
Physical Therapy Treatment Patient Details Name: Maria [name] MRN: [id number] DOB: [birth year] Today's Date: 08/22/2020    History of Present Illness 86 yo female back to ED with inability to ambulate, weakness, pain. Hx of severe OA R knee, DM, CKD, HF, OSA, syncope (she discharged home from [facility] on 12/7 as she refused SNF placement, but returned that pm due to inability to care for self.  While waiting in ED for placement on 12/9 she was noted to have Rt sided weakness, and decreased ability to communicate. CTA showed M2 occlusion.  Attempt at revascularization was unsuccessful.    PT Comments    Patient alert and interactive with therapist today. Follows 1 step motor commands with delay and multimodal cues. Placed bed in egress chair position and worked on thoracic spinal extensors, stretching of pecs, cervical AROM, activating lateral trunk flexors and WB through RUE. Pt engaged when performing reaching activities using elf named Ulice Dash, present in room. Able to understand some words but otherwise speech not intelligible. Pt fatigues pretty quickly post session. Will follow.    Follow Up Recommendations  SNF     Equipment Recommendations  None recommended by PT    Recommendations for Other Services       Precautions / Restrictions Precautions Precautions: Fall Precaution Comments: h/o syncope during past hospitalization, NG tube, mitt on Left hand Restrictions Weight Bearing Restrictions: No    Mobility  Bed Mobility Overal bed mobility: Needs Assistance Bed Mobility: Supine to Sit     Supine to sit: Mod assist;HOB elevated     General bed mobility comments: Brought bed into chair position and worked on bringing trunk forward with Mod A; pt assisting with LUE and with cues to bring head forward.  Transfers                    Ambulation/Gait                 Stairs             Wheelchair Mobility    Modified Rankin (Stroke Patients  Only) Modified Rankin (Stroke Patients Only) Pre-Morbid Rankin Score: Moderately severe disability Modified Rankin: Severe disability     Balance Overall balance assessment: Needs assistance Sitting-balance support: Feet unsupported;Single extremity supported Sitting balance-Leahy Scale: Poor Sitting balance - Comments: Worked on thoracic spinal extension/rotation and pec stretch using sheet; cervical AROM in all directions and reaching outside boS with LUE to activate lateral trunk flexors and abdominals using elf stuffed animal present in room. Worked on pushing/pulling with LUE on rail and WB through [place] as well.                                    Cognition Arousal/Alertness: Awake/alert Behavior During Therapy: WFL for tasks assessed/performed Overall Cognitive Status: Difficult to assess                                 General Comments: Pt with significant communication deficits and does not appear to be aware of this.  Smiling appropriately and making kissy faces when leaving. Laughs as well. trying to verbalize and some words are intelligible. Did well including elf named "Ulice Dash" during session. She will follow one step motor command inconsistently with a delay and multi modal cues      Exercises  General Comments General comments (skin integrity, edema, etc.): Family member present during session.      Pertinent Vitals/Pain Pain Assessment: Faces Faces Pain Scale: No hurt    Home Living                      Prior Function            PT Goals (current goals can now be found in the care plan section) Progress towards PT goals: Progressing toward goals (slowly)    Frequency    Min 2X/week      PT Plan Current plan remains appropriate    Co-evaluation              AM-PAC PT "6 Clicks" Mobility   Outcome Measure  Help needed turning from your back to your side while in a flat bed without using bedrails?:  Total Help needed moving from lying on your back to sitting on the side of a flat bed without using bedrails?: Total Help needed moving to and from a bed to a chair (including a wheelchair)?: Total Help needed standing up from a chair using your arms (e.g., wheelchair or bedside chair)?: Total Help needed to walk in hospital room?: Total Help needed climbing 3-5 steps with a railing? : Total 6 Click Score: 6    End of Session   Activity Tolerance: Patient tolerated treatment well;Patient limited by fatigue Patient left: in bed;with call bell/phone within reach;with bed alarm set;with SCD's reapplied;with family/visitor present Nurse Communication: Mobility status;Need for lift equipment PT Visit Diagnosis: Other abnormalities of gait and mobility (R26.89);Muscle weakness (generalized) (M62.81)     Time: 6629-4765 PT Time Calculation (min) (ACUTE ONLY): 21 min  Charges:  $Neuromuscular Re-education: 8-22 mins                     Marisa Severin, PT, DPT Acute Rehabilitation Services Pager [phone] Office [place] 08/22/2020, 12:55 PM

## 2020-08-23 ENCOUNTER — Encounter (HOSPITAL_COMMUNITY): Payer: Self-pay

## 2020-08-23 DIAGNOSIS — L8990 Pressure ulcer of unspecified site, unspecified stage: Secondary | ICD-10-CM | POA: Insufficient documentation

## 2020-08-23 LAB — GLUCOSE, CAPILLARY
Glucose-Capillary: 131 mg/dL — ABNORMAL HIGH (ref 70–99)
Glucose-Capillary: 168 mg/dL — ABNORMAL HIGH (ref 70–99)
Glucose-Capillary: 139 mg/dL — ABNORMAL HIGH (ref 70–99)
Glucose-Capillary: 127 mg/dL — ABNORMAL HIGH (ref 70–99)
Glucose-Capillary: 151 mg/dL — ABNORMAL HIGH (ref 70–99)
Glucose-Capillary: 152 mg/dL — ABNORMAL HIGH (ref 70–99)

## 2020-08-23 LAB — BASIC METABOLIC PANEL
Anion gap: 11 (ref 5–15)
BUN: 74 mg/dL — ABNORMAL HIGH (ref 8–23)
CO2: 20 mmol/L — ABNORMAL LOW (ref 22–32)
Calcium: 7.9 mg/dL — ABNORMAL LOW (ref 8.9–10.3)
Chloride: 109 mmol/L (ref 98–111)
Creatinine, Ser: 5.11 mg/dL — ABNORMAL HIGH (ref 0.44–1.00)
GFR, Estimated: 7 mL/min — ABNORMAL LOW (ref >60)
Glucose, Bld: 140 mg/dL — ABNORMAL HIGH (ref 70–99)
Potassium: 5 mmol/L (ref 3.5–5.1)
Sodium: 140 mmol/L (ref 135–145)

## 2020-08-23 LAB — CBC
HCT: 27.5 % — ABNORMAL LOW (ref 36.0–46.0)
Hemoglobin: 8.5 g/dL — ABNORMAL LOW (ref 12.0–15.0)
MCH: 28.1 pg (ref 26.0–34.0)
MCHC: 30.9 g/dL (ref 30.0–36.0)
MCV: 90.8 fL (ref 80.0–100.0)
Platelets: 182 10*3/uL (ref 150–400)
RBC: 3.03 MIL/uL — ABNORMAL LOW (ref 3.87–5.11)
RDW: 19.3 % — ABNORMAL HIGH (ref 11.5–15.5)
WBC: 9 10*3/uL (ref 4.0–10.5)
nRBC: 0 % (ref 0.0–0.2)

## 2020-08-23 LAB — TRIGLYCERIDES: Triglycerides: 73 mg/dL (ref <150)

## 2020-08-23 MED ORDER — SODIUM BICARBONATE 650 MG PO TABS
650.0000 mg | ORAL_TABLET | Freq: Three times a day (TID) | ORAL | Status: DC
  Administered 2020-08-23 – 2020-08-27 (×15): 650 mg via NASOGASTRIC
  Filled 2020-08-23 (×15): qty 1

## 2020-08-23 MED ORDER — GERHARDT'S BUTT CREAM
TOPICAL_CREAM | Freq: Every day | CUTANEOUS | Status: DC | PRN
  Filled 2020-08-23 (×2): qty 1

## 2020-08-23 MED ORDER — FUROSEMIDE 10 MG/ML IJ SOLN
80.0000 mg | Freq: Once | INTRAMUSCULAR | Status: AC
  Administered 2020-08-23: 11:00:00 80 mg via INTRAVENOUS
  Filled 2020-08-23: qty 8

## 2020-08-23 MED ORDER — ALBUMIN HUMAN 25 % IV SOLN
12.5000 g | Freq: Once | INTRAVENOUS | Status: AC
  Administered 2020-08-23: 11:00:00 12.5 g via INTRAVENOUS
  Filled 2020-08-23: qty 50

## 2020-08-23 NOTE — Progress Notes (Signed)
[facility] Progress Note   Assessment/ Plan:   1. Acute kidney Injury: This is suspected likely to be from contrast-induced nephropathy given the timeline of events. Unfortunately anuric overnight with worsening BUN/creatinine but without any acute indications for dialysis at this time. Overall given her baseline functional status/dementia and current worsening imposed by CVA, would be hesitant offering renal replacement therapy as this would worsen her quality of life. LR stopped yesterday. Patient remains anuric with worsening kidney function  2. Hypertension: Blood pressures currently managed with metoprolol and clevidipine per neurology targets to limit extension of penumbra/infarct. 3. 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 4. Anemia: We will check iron stores/studies with next labs. At the current blood pressures+, will avoid ESA. 5. [place] metabolic acidosis: Bicarbonate level within acceptable range and without any respiratory compromise. Will follow to decide on need for medical management  Subjective:   Patient seen at bedside, difficult to understand.    Objective:   BP (!) 160/73   Pulse (!) 107   Temp 98.3 F (36.8 C) (Oral)   Resp 19   Ht 4\' 11"  (1.499 m)   Wt 74 kg   SpO2 99%   BMI 32.95 kg/m   Intake/Output Summary (Last 24 hours) at 08/23/2020 4081 Last data filed at 08/23/2020 0805 Gross per 24 hour  Intake 1816.42 ml  Output --  Net 1816.42 ml   Weight change:   Physical Exam: General: Elderly female, ill appearing, NAD, NGT in place, expressive aphasia  Cardiac: Tachycardic, no m/r/g Pulmonary: CTABL, no wheezing, rhonchi, rales Abd: Soft, non-tender, non-distended, normoactive BS  Imaging: MR BRAIN WO CONTRAST  Result Date: 08/21/2020 CLINICAL DATA:  Follow-up examination for acute stroke. EXAM: MRI HEAD  WITHOUT CONTRAST TECHNIQUE: Multiplanar, multiecho pulse sequences of the brain and surrounding structures were obtained without intravenous contrast. COMPARISON:  Prior CTs from 08/19/2020. FINDINGS: Brain: Moderately advanced cerebral atrophy and chronic microvascular ischemic disease again noted, stable. Scattered remote lacunar infarcts about the bilateral basal ganglia/internal capsules again noted. Few additional remote lacunar infarcts about the midbrain, with additional scattered remote bilateral cerebellar infarcts again noted. Continued interval evolution of moderate-sized left MCA distribution infarct involving the mid and posterior left frontal region. Associated diffusion abnormality is somewhat increased and more confluent in appearance as compared to previous, with slightly increased size. Evidence for faint petechial hemorrhage at the infarct bed without hemorrhagic transformation or significant regional mass effect (series 14, image 28). Multiple additional scattered predominantly cortical and subcortical ischemic infarcts again noted involving the bilateral cerebral hemispheres, as well as the right greater than left cerebellum involvement of the pre and postcentral gyri again noted bilaterally. Overall number and distribution of these additional infarcts is not felt to be significantly changed or progressed. Possible single focus of associated petechial hemorrhage at the left parietal lobe (series 14, image 33). No other evidence for associated hemorrhage or significant mass effect. Few additional scattered chronic micro hemorrhages noted within the brain and cerebellum, otherwise stable. No mass lesion, midline shift or mass effect. Ventricular size is stable without hydrocephalus. No extra-axial fluid collection. Pituitary gland suprasellar region normal. Midline structures intact. Vascular: Focal T1 hyperintensity and susceptibility artifact seen involving a proximal left M2 branch, consistent  with major intracranial vascular flow voids are otherwise maintained. Thrombus (series 14, image 23). Skull and upper cervical spine: Craniocervical junction within normal limits. Bone marrow signal intensity within normal limits. Interval  evolution of previously identified right frontal scalp contusion, decreased in prominence from previous. Sinuses/Orbits: Sequelae of prior bilateral ocular lens replacement. Globes and orbital soft tissues demonstrate no acute finding. Chronic mucosal thickening noted within the sphenoethmoidal sinuses. Paranasal sinuses are otherwise clear. Mild to moderate bilateral mastoid effusions, slightly increased in prominence from previous. Inner ear structures within normal limits. Other: None. IMPRESSION: 1. Continued interval evolution of moderate-sized early subacute left MCA distribution infarct involving the mid and posterior left frontal region. Evidence for faint petechial hemorrhage at the infarct bed without hemorrhagic transformation or significant regional mass effect. 2. Multiple additional scattered predominantly cortical and subcortical early subacute ischemic infarcts involving the bilateral cerebral hemispheres and cerebellum, not significantly changed or progressed from previous. 3. Focal T1 hyperintensity and susceptibility artifact involving a proximal left M2 branch, consistent with previously identified intraluminal thrombus. 4. Underlying moderately advanced cerebral atrophy with chronic microvascular ischemic disease with remote ischemic changes as above, stable. Electronically Signed   By: Jeannine Boga M.D.   On: 08/21/2020 21:29   US RENAL  Addendum Date: 08/21/2020   ADDENDUM REPORT: 08/21/2020 15:14 IMPRESSION: Echogenic focus in the right kidney is nonspecific but could represent a small nonobstructing renal stone. Electronically Signed   By: Audie Pinto M.D.   On: 08/21/2020 15:14   Result Date: 08/21/2020 CLINICAL DATA:  Acute kidney  injury EXAM: RENAL / URINARY TRACT ULTRASOUND COMPLETE COMPARISON:  Renal ultrasound 08/10/2020 FINDINGS: Right Kidney: Renal measurements: 10.0 x 5.1 x 5.5 cm = volume: 146 mL. Mild increased echogenicity. There is an echogenic focus in the superior aspect of the kidney measuring 0.4 cm which could represent a small calculus. There is a 1.0 cm cyst. No hydronephrosis. Left Kidney: Renal measurements: 11.1 x 5.6 x 6.6 cm = volume: 212 mL. Mildly increased echogenicity. No hydronephrosis. There are 2 cysts measuring 2.3 cm and 1.0 cm. Bladder: Poorly visualized, possibly decompressed. Other: None. IMPRESSION: No acute finding in the bilateral kidneys. Increased echogenicity bilaterally as can be seen in medical renal disease. Electronically Signed: By: Audie Pinto M.D. On: 08/21/2020 15:08   DG Swallowing Func-Speech Pathology  Result Date: 08/22/2020 Objective Swallowing Evaluation: Type of Study: MBS-Modified Barium Swallow Study  Patient Details Name: Maria [name] MRN: [id number] Date of Birth: 10-11-1925 Today's Date: 08/22/2020 Time: SLP Start Time (ACUTE ONLY): 1345 -SLP Stop Time (ACUTE ONLY): 6734 SLP Time Calculation (min) (ACUTE ONLY): 20 min Past Medical History: Past Medical History: Diagnosis Date . Anxiety  . Arthritis  . Blood transfusion  . Depression  . Diabetes mellitus  . Edema  . Heart murmur  . Hyperlipidemia  . Hypertension  . Sleep apnea   cpap . Stroke ([place])   3 x tias . TIA (transient ischemic attack)  Past Surgical History: Past Surgical History: Procedure Laterality Date . ABDOMINAL HYSTERECTOMY   . APPENDECTOMY   . BIOPSY  08/13/2020  Procedure: BIOPSY;  Surgeon: Rush Landmark Telford Nab., MD;  Location: [facility];  Service: Gastroenterology;; . ESOPHAGOGASTRODUODENOSCOPY (EGD) WITH PROPOFOL N/A 08/13/2020  Procedure: ESOPHAGOGASTRODUODENOSCOPY (EGD) WITH PROPOFOL;  Surgeon: Irving Copas., MD;  Location: [facility];  Service: Gastroenterology;  Laterality: N/A; .  HEMOSTASIS CLIP PLACEMENT  08/13/2020  Procedure: HEMOSTASIS CLIP PLACEMENT;  Surgeon: Irving Copas., MD;  Location: [facility];  Service: Gastroenterology;; . IR PERCUTANEOUS ART THROMBECTOMY/INFUSION INTRACRANIAL INC DIAG ANGIO  08/20/2020 . NM MYOVIEW LTD  [id number]  post stress left ventricle is normal in size, ejection fraction is 65%, normal myocardial perfusion study,  low risk scan . PARTIAL HYSTERECTOMY   . RADIOLOGY WITH ANESTHESIA N/A 08/17/2020  Procedure: IR WITH ANESTHESIA - CODE STROKE;  Surgeon: Luanne Bras, MD;  Location: [facility];  Service: Radiology;  Laterality: N/A; . TRANSTHORACIC ECHOCARDIOGRAM  212248 HPI: CADANCE RAUS is a 86 y.o. with a history of previous stroke, hypertension, DM, hyperlipidemia who was recently admitted after syncopal episode and found to have anemia with gastritis and erosions (started on PPI).  Developed with right-sided weakness and aphasia. Found to have cerebral infarction due to occlusion or stenosis of left middle cerebral artery. CXR clear. No prior ST notes. She had neuro change on 12/11 with MRI pending  No data recorded Assessment / Plan / Recommendation CHL IP CLINICAL IMPRESSIONS 08/22/2020 Clinical Impression Oropharyngeal dysphagia is mild-moderate and marked by oral transit delays, residue, reduced propulsion and mistimed swallow onset. Combination of reduced lingual ROM/sensation led to significantly delayed transit to pharynx up to 3 minutes. Excessive right bucal cavity retention and anterior spillage without awareness. All trials reached her valleculae and sat for greater than 8-10 seconds before swallow initiated. Nectar thick was penetrated to her vocal cords, without response and almost exited vestibule but slight remained at petiole of epiglottis. Mild-mod vallecular and pyriform sinus residue with honey thick without penetration/aspiration. Given delays in oral and pharyngeal transit she would likely be unable to maintain  nurtitional needs. Recommend continue NPO and therapist willl continue intervention. SLP Visit Diagnosis Dysphagia, oropharyngeal phase (R13.12) Attention and concentration deficit following -- Frontal lobe and executive function deficit following -- Impact on safety and function Moderate aspiration risk;Severe aspiration risk   CHL IP TREATMENT RECOMMENDATION 08/22/2020 Treatment Recommendations Therapy as outlined in treatment plan below   Prognosis 08/22/2020 Prognosis for Safe Diet Advancement (No Data) Barriers to Reach Goals Severity of deficits Barriers/Prognosis Comment -- CHL IP DIET RECOMMENDATION 08/22/2020 SLP Diet Recommendations NPO Liquid Administration via -- Medication Administration Via alternative means Compensations -- Postural Changes --   CHL IP OTHER RECOMMENDATIONS 08/22/2020 Recommended Consults -- Oral Care Recommendations Oral care QID Other Recommendations --   CHL IP FOLLOW UP RECOMMENDATIONS 08/22/2020 Follow up Recommendations Skilled Nursing facility   [facility] IP FREQUENCY AND DURATION 08/22/2020 Speech Therapy Frequency (ACUTE ONLY) min 2x/week Treatment Duration 2 weeks      CHL IP ORAL PHASE 08/22/2020 Oral Phase Impaired Oral - Pudding Teaspoon -- Oral - Pudding Cup -- Oral - Honey Teaspoon Decreased bolus cohesion;Delayed oral transit;Lingual/palatal residue;Right pocketing in lateral sulci;Right anterior bolus loss Oral - Honey Cup Decreased bolus cohesion;Delayed oral transit;Lingual/palatal residue;Right pocketing in lateral sulci;Right anterior bolus loss Oral - Nectar Teaspoon Decreased bolus cohesion;Delayed oral transit;Lingual/palatal residue;Right pocketing in lateral sulci;Right anterior bolus loss Oral - Nectar Cup NT Oral - Nectar Straw -- Oral - Thin Teaspoon -- Oral - Thin Cup -- Oral - Thin Straw -- Oral - Puree Decreased bolus cohesion;Delayed oral transit;Lingual/palatal residue;Right pocketing in lateral sulci Oral - Mech Soft -- Oral - Regular -- Oral -  Multi-Consistency -- Oral - Pill -- Oral Phase - Comment --  CHL IP PHARYNGEAL PHASE 08/22/2020 Pharyngeal Phase Impaired Pharyngeal- Pudding Teaspoon -- Pharyngeal -- Pharyngeal- Pudding Cup -- Pharyngeal -- Pharyngeal- Honey Teaspoon Delayed swallow initiation-vallecula;Pharyngeal residue - valleculae;Pharyngeal residue - pyriform Pharyngeal -- Pharyngeal- Honey Cup Delayed swallow initiation-vallecula;Pharyngeal residue - valleculae;Pharyngeal residue - pyriform Pharyngeal -- Pharyngeal- Nectar Teaspoon Penetration/Aspiration during swallow;Delayed swallow initiation-vallecula;Pharyngeal residue - valleculae Pharyngeal Material enters airway, CONTACTS cords and not ejected out Pharyngeal- Nectar Cup NT Pharyngeal --  Pharyngeal- Nectar Straw -- Pharyngeal -- Pharyngeal- Thin Teaspoon -- Pharyngeal -- Pharyngeal- Thin Cup -- Pharyngeal -- Pharyngeal- Thin Straw -- Pharyngeal -- Pharyngeal- Puree Delayed swallow initiation-vallecula;Pharyngeal residue - valleculae Pharyngeal -- Pharyngeal- Mechanical Soft -- Pharyngeal -- Pharyngeal- Regular -- Pharyngeal -- Pharyngeal- Multi-consistency -- Pharyngeal -- Pharyngeal- Pill -- Pharyngeal -- Pharyngeal Comment --  CHL IP CERVICAL ESOPHAGEAL PHASE 08/22/2020 Cervical Esophageal Phase WFL Pudding Teaspoon -- Pudding Cup -- Honey Teaspoon -- Honey Cup -- Nectar Teaspoon -- Nectar Cup -- Nectar Straw -- Thin Teaspoon -- Thin Cup -- Thin Straw -- Puree -- Mechanical Soft -- Regular -- Multi-consistency -- Pill -- Cervical Esophageal Comment -- [name] 08/22/2020, 2:58 PM [name] Litaker M.Ed CCC-SLP Speech-Language Pathologist Pager [phone] Office [phone]               Labs: BMET Recent Labs  Lab 08/13/2020 2321 09/06/2020 1216 08/10/2020 1226 08/19/20 0529 08/20/20 0435 08/20/20 1757 08/21/20 0442 08/21/20 1700 08/22/20 0541 08/23/20 0215  NA 138 139 139 137 139  --  141  --  140 140  K 5.4* 3.6 3.6 3.5 3.7  --  3.9  --  4.1 5.0  CL 104 102  101 102 108  --  110  --  107 109  CO2 24 24  --  21* 20*  --  19*  --  20* 20*  GLUCOSE 155* 115* 112* 92 105*  --  118*  --  131* 140*  BUN 26* 30* 27* 27* 34*  --  44*  --  58* 74*  CREATININE 1.65* 1.28* 1.30* 1.38* 2.54*  --  3.56*  --  4.43* 5.11*  CALCIUM 9.1 8.9  --  8.2* 8.0*  --  8.1*  --  7.8* 7.9*  PHOS  --   --   --   --   --  5.6* 6.1* 5.5* 4.8*  --    CBC Recent Labs  Lab 09/02/2020 2321 08/10/2020 1216 08/30/2020 1226 08/19/20 0529 08/19/20 1200 08/20/20 0435 08/21/20 0442 08/22/20 0541 08/23/20 0215  WBC 10.9* 9.2  --  8.3  --  7.3 8.9 8.7 9.0  NEUTROABS 8.9* 7.5  --  6.7  --  5.8  --   --   --   HGB 10.4* 10.2*   < > 8.6*   < > 7.5* 8.2* 8.5* 8.5*  HCT 34.4* 32.8*   < > 28.5*   < > 23.9* 27.0* 27.7* 27.5*  MCV 94.8 90.9  --  91.1  --  91.9 91.5 90.8 90.8  PLT 142* 196  --  224  --  196 216 248 182   < > = values in this interval not displayed.    Medications:    . allopurinol  200 mg Per Tube Daily  . aspirin  325 mg Per Tube Daily  . atorvastatin  40 mg Per Tube Daily  . Chlorhexidine Gluconate Cloth  6 each Topical Daily  . donepezil  10 mg Per Tube QPM  . ezetimibe  10 mg Per Tube QHS  . feeding supplement (PROSource TF)  45 mL Per Tube BID  . ferrous sulfate  300 mg Per Tube BID WC  . folic acid-pyridoxine-cyancobalamin  1 tablet Per Tube Daily  . free water  100 mL Per Tube Q4H  . insulin aspart  0-15 Units Subcutaneous Q4H  . mouth rinse  15 mL Mouth Rinse BID  . metoprolol tartrate  25 mg Per Tube BID  . pantoprazole  sodium  40 mg Per Tube Daily    Asencion Noble, M.D. [facility] 08/23/2020 9:18 AM

## 2020-08-23 NOTE — NC FL2 (Signed)
[place] MEDICAID FL2 LEVEL OF CARE SCREENING TOOL     IDENTIFICATION  Patient Name: Maria [name] Birthdate: 03/28/1926 Sex: female Admission Date (Current Location): 08/29/2020  [facility] and [place] Number:  Herbalist and Address:  [facility], [place] [address], [place], [place] [address]      Provider Number: [id number]  Attending Physician Name and Address:  Rosalin Hawking, MD  Relative Name and Phone Number:  Margy Clarks (Niece) [phone]    Current Level of Care: Hospital Recommended Level of Care: [place] Prior Approval Number:    Date Approved/Denied:   PASRR Number: [id number]  Discharge Plan: SNF    Current Diagnoses: Patient Active Problem List   Diagnosis Date Noted  . Stroke (cerebrum) ([place]) 08/22/2020  . Middle cerebral artery embolism, left 09/06/2020  . Chronic diastolic CHF (congestive heart failure) ([place]) 08/13/2020  . Gastritis 08/17/2020  . Iron deficiency anemia 09/06/2020  . Acute renal failure superimposed on stage 3b chronic kidney disease ([place]) 08/17/2020  . Seronegative rheumatoid arthritis ([place]) 06/02/2020  . Chronic renal disease, stage 3, moderately decreased glomerular filtration rate between 30-59 mL/min/1.73 square meter ([place]) 01/08/2020  . Hypertensive heart and kidney disease with chronic diastolic congestive heart failure and stage 3 chronic kidney disease ([place]) 01/06/2020  . Primary osteoarthritis of right knee 01/06/2020  . Joint pain 12/03/2019  . Hav (hallux abducto valgus), right 11/04/2019  . Diabetic peripheral vascular disease ([place]) 09/15/2019  . Gout 07/02/2019  . Insulin dependent type 2 diabetes mellitus ([place]) 07/02/2019  . Acute on chronic diastolic CHF (congestive heart failure) ([place]) 03/26/2018  . Acute on chronic renal insufficiency 03/26/2018  . Dyspnea on exertion 02/14/2018  . Palpitations 02/13/2017  . Chest pain 03/25/2015  . Acute diverticulitis 03/04/2013   . Bilateral lower extremity edema 01/23/2013  . Hyperlipidemia 01/23/2013  . Diverticulitis 01/31/2012  . Diabetes mellitus ([place]) 01/31/2012  . Essential hypertension 01/31/2012  . Obstructive sleep apnea on CPAP 01/31/2012  . History of TIAs 01/31/2012  . Obesity 01/31/2012  . H/O vertigo 01/31/2012    Orientation RESPIRATION BLADDER Height & Weight     Self,Time,Situation,Place  Normal Incontinent Weight: 163 lb 2.3 oz (74 kg) Height:  4\' 11"  (149.9 cm)  BEHAVIORAL SYMPTOMS/MOOD NEUROLOGICAL BOWEL NUTRITION STATUS      Incontinent Feeding tube  AMBULATORY STATUS COMMUNICATION OF NEEDS Skin   Total Care Non-Verbally Other (Comment) (cracking, ecchymosis)                       Personal Care Assistance Level of Assistance  Bathing,Feeding,Dressing Bathing Assistance: Maximum assistance Feeding assistance: Maximum assistance Dressing Assistance: Maximum assistance     Functional Limitations Info  Sight,Hearing,Speech Sight Info: Adequate Hearing Info: Adequate Speech Info: Impaired    SPECIAL CARE FACTORS FREQUENCY  PT (By licensed PT),OT (By licensed OT)     PT Frequency: 5 times a week OT Frequency: 5 times a week            Contractures Contractures Info: Present    Additional Factors Info  Code Status Code Status Info: Full Code Allergies Info: Ace Inhibitors, Betadine (povidone Iodine),Valsartan           Current Medications (08/23/2020):  This is the current hospital active medication list Current Facility-Administered Medications  Medication Dose Route Frequency Provider Last Rate Last Admin  . allopurinol (ZYLOPRIM) tablet 200 mg  200 mg Per Tube Daily Rosalin Hawking, MD   200  mg at 08/22/20 0956  . aspirin tablet 325 mg  325 mg Per Tube Daily Rosalin Hawking, MD   325 mg at 08/22/20 0956  . atorvastatin (LIPITOR) tablet 40 mg  40 mg Per Tube Daily Rosalin Hawking, MD   40 mg at 08/22/20 0956  . Chlorhexidine Gluconate Cloth 2 % PADS 6 each  6 each  Topical Daily Greta Doom, MD   6 each at 08/22/20 0957  . donepezil (ARICEPT) tablet 10 mg  10 mg Per Tube QPM Rosalin Hawking, MD   10 mg at 08/22/20 1724  . ezetimibe (ZETIA) tablet 10 mg  10 mg Per Tube QHS Rosalin Hawking, MD   10 mg at 08/22/20 2222  . feeding supplement (GLUCERNA 1.5 CAL) liquid 1,000 mL  1,000 mL Per Tube Continuous Rosalin Hawking, MD 45 mL/hr at 08/22/20 1235 1,000 mL at 08/22/20 1235  . feeding supplement (PROSource TF) liquid 45 mL  45 mL Per Tube BID Rosalin Hawking, MD   45 mL at 08/22/20 2222  . ferrous sulfate 300 (60 Fe) MG/5ML syrup 300 mg  300 mg Per Tube BID WC Rosalin Hawking, MD   300 mg at 08/23/20 0801  . folic acid-pyridoxine-cyancobalamin (FOLTX) 2.5-25-2 MG per tablet 1 tablet  1 tablet Per Tube Daily Rosalin Hawking, MD   1 tablet at 08/22/20 0956  . free water 100 mL  100 mL Per Tube Q4H Rosalin Hawking, MD   100 mL at 08/23/20 0802  . insulin aspart (novoLOG) injection 0-15 Units  0-15 Units Subcutaneous Q4H Garvin Fila, MD   3 Units at 08/23/20 0801  . MEDLINE mouth rinse  15 mL Mouth Rinse BID Garvin Fila, MD   15 mL at 08/22/20 2222  . metoprolol tartrate (LOPRESSOR) tablet 25 mg  25 mg Per Tube BID Rosalin Hawking, MD      . pantoprazole sodium (PROTONIX) 40 mg/20 mL oral suspension 40 mg  40 mg Per Tube Daily Rosalin Hawking, MD   40 mg at 08/22/20 3810     Discharge Medications: Please see discharge summary for a list of discharge medications.  Relevant Imaging Results:  Relevant Lab Results:   Additional Information SSN #:  [id number]  Joanne Chars, LCSW

## 2020-08-23 NOTE — Progress Notes (Signed)
STROKE TEAM PROGRESS NOTE   INTERVAL HISTORY No family at the bedside. Pt lying in bed, more awake alert and blow kisses to me when I came in. She is very pleasant and in high spirit. However, she still has expressive more then receptive aphasia, and right facial droop with right hemiparesis. Cre up to 5.11. nephrology on board.  Vitals:   08/23/20 1200 08/23/20 1300 08/23/20 1400 08/23/20 1500  BP: (!) 129/57 (!) 158/67 (!) 149/66 (!) 168/86  Pulse: 77 86 79 80  Resp: 15 19 15 20   Temp: 98.6 F (37 C)     TempSrc: Oral     SpO2: 98% 100% 98% 100%  Weight:      Height:       CBC:  Recent Labs  Lab 08/19/20 0529 08/19/20 1200 08/20/20 0435 08/21/20 0442 08/22/20 0541 08/23/20 0215  WBC 8.3  --  7.3   < > 8.7 9.0  NEUTROABS 6.7  --  5.8  --   --   --   HGB 8.6*   < > 7.5*   < > 8.5* 8.5*  HCT 28.5*   < > 23.9*   < > 27.7* 27.5*  MCV 91.1  --  91.9   < > 90.8 90.8  PLT 224  --  196   < > 248 182   < > = values in this interval not displayed.   Basic Metabolic Panel:  Recent Labs  Lab 08/21/20 1700 08/22/20 0541 08/23/20 0215  NA  --  140 140  K  --  4.1 5.0  CL  --  107 109  CO2  --  20* 20*  GLUCOSE  --  131* 140*  BUN  --  58* 74*  CREATININE  --  4.43* 5.11*  CALCIUM  --  7.8* 7.9*  MG 1.9 1.9  --   PHOS 5.5* 4.8*  --    Lipid Panel:  Recent Labs  Lab 08/19/20 0529 08/23/20 0215  CHOL 104  --   TRIG 76 73  HDL 39*  --   CHOLHDL 2.7  --   VLDL 15  --   LDLCALC 50  --    HgbA1c:  Recent Labs  Lab 08/19/20 0528  HGBA1C 5.6   Urine Drug Screen:  Recent Labs  Lab 09/01/2020 1216  LABOPIA NONE DETECTED  COCAINSCRNUR NONE DETECTED  LABBENZ NONE DETECTED  AMPHETMU NONE DETECTED  THCU NONE DETECTED  LABBARB NONE DETECTED    Alcohol Level  Recent Labs  Lab 08/20/2020 1216  ETH <10    IMAGING past 24 hours No results found.  Neuro Interventional Radiology - Cerebral Angiogram with Intervention - Dr Estanislado Pandy 09/09/2020 5:25  PM Findings. 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 2.Delated partial reconstitution of Lt MCA from Lt ACA collaterals retrogradely. Unsuccessful attempts at revascularization due to severe prox  Lt ICA tortuosity and of the aortic arch.  Transthoracic Echocardiogram  08/11/2020 IMPRESSIONS  1. Hyperdynamic LV function; proximal septal thickening with systolic anterior motion of MV; LVOT velocity of 2.5 m/s that increased to 3.5 m/s; findings c/w HOCM.  2. Left ventricular ejection fraction, by estimation, is >75%. The left ventricle has hyperdynamic function. The left ventricle has no regional wall motion abnormalities. There is mild left ventricular hypertrophy. Left ventricular diastolic parameters are consistent with Grade I diastolic dysfunction (impaired relaxation).  3. Right ventricular systolic function is normal. The right ventricular size is normal.  4. The mitral valve is  normal in structure. No evidence of mitral valve regurgitation. No evidence of mitral stenosis.  5. The aortic valve has an indeterminant number of cusps. Aortic valve regurgitation is not visualized. No aortic stenosis is present.  6. The inferior vena cava is normal in size with greater than 50% respiratory variability, suggesting right atrial pressure of 3 mmHg.   PHYSICAL EXAM   Temp:  [97.8 F (36.6 C)-98.6 F (37 C)] 98.6 F (37 C) (12/14 1200) Pulse Rate:  [77-114] 80 (12/14 1500) Resp:  [10-20] 20 (12/14 1500) BP: (123-171)/(57-88) 168/86 (12/14 1500) SpO2:  [98 %-100 %] 100 % (12/14 1500)  General - Well nourished, well developed, not in acute distress.  Ophthalmologic - fundi not visualized due to noncooperation.  Cardiovascular - Regular rhythm and rate.  Neuro - eyes open, more awake, alert, pleasant in high spirit, however, still has severe dysarthria, intangible words. Expressive > receptive aphasia. Not able to answer orientation question, followed midline commands but not  peripheral commands. Eyes midline, able to gaze bilaterally, tracking bilaterally, however, blinking to visual threat on the left but not on the right. Right facial droop, right eye closure weaker than left. Tongue protrusion to the right. LUE at least 3/5 without drift and BLE slight withdraw to pain. RUE flaccid. Bilaterally babinski positive.   ASSESSMENT/PLAN Maria [name] is a 86 y.o. female with history of previous stroke, HTN, HLD, initially admitted for acute deconditioning, gen weakness post anemia/gastritis. She was an in-house code stroke when staff noted a change in mentation. CTA showed Left M2 occlusion for which she had emergent thrombectomy. Had IR - 12/9 5:25 PM - Occluded Lt MCA inf division in the mid M2 seg. Delated partial reconstitution of Lt MCA from Lt ACA collaterals retrogradely. Unsuccessful attempts at  revascularization due to severe prox  Lt ICA tortuosity and of the aortic arch.  Stroke: L MCA moderate stroke due to left M2 occlusion with unsuccessful IR and in TIMELESS trial, likely cardioembolic etiology  Code Stroke CT head: hyperdensity along the left M2 MCA in the sylvian fissure  CTA head & neck Distal branch of M2 occlusion.   CT perfusion 82ml with missmatch  MRI  Multiple scattered tiny strokes bilat, moderate LMCA stroke, small amt of petechial hemorrhage noted in bed of infarct. Small vessel ischemic disease and old lacunar strokes noted.   Repeat CTA unchanged left M2 occlusion  2D Echo -08/11/2020 - EF > 75%. No cardiac source of emboli identified.   LDL 50  HgbA1c 5.6  UDS - negative  SARS - negative  VTE prophylaxis - post TIMELESS trial, hold for possible IV thrombolytic administration - SCDs  ASA 81mg  (but this was recently put on hold by GI for acute gastritis and anemia) prior to admission, now on ASA 325 mg daily  Therapy recommendations:  SNF  Disposition:  pending  Recent GIB with anemia  12/2 admission for syncope,  GIB  Found to have severe anemia with Hb 6.9  Received PRBC   EGD done showed NSAID related gastritis and gastric ulcers and Cameron's erosions. Per GI at that time, hold ASA for2 weeks from admission (restart 08/24/20), continue BID PPI for 4 months, start PO iron in 1 week.  This admission Hb 10.2->...->7.5->8.2->8.5->8.5  CBC monitoring  Iron resumed  PPI IV bid  AKI on CKD  Cre 1.12->1.07->1.38->2.54->3.56->4.43->5.11 (per nephrology - try to avoid dialysis at this time)  On IVF - NS @ 50-> LR @ 50->off   Decreased urine  output  Nephrology onboard, appreciate help  On TF with Glucerna @ 40 and Free water 100 ml Q 4 hrs  Nephrology is considering albumin/lasix challenge.   BMP monitoring  Hypertension  Home meds:  Toprol XL 25 mg daily, micardis 20 mg, Lasix 40mg    Off cleviprex  Current BP meds - metoprolol 12.5->25 bid  Stable on the high end . Long-term BP goal 130-150 given left M2 occlusion  Hyperlipidemia  Home meds:  Lipitor 40mg  and Zetia 10mg    Home meds resumed   LDL 50, goal < 70  Continue statin at discharge  Diabetes type II Controlled  Home meds:  Insulin  Now - SSI  HgbA1c 5.6, goal < 7.0  CBG monitoring  Avoid hypoglycemia  Dysphagia   Did not pass swallow or MBS  NPO  NGT -> cortrak  On TF @ 40 and FW  Speech on board  Other Stroke Risk Factors  Advanced Age >/= 51   Coronary artery disease  Congestive heart failure  Other Active Problems and Findings  Code status - Full code  Incidental finding to f/u on as out pt: Multiple thyroid nodules, the largest within the left lobe measuring 2.3 cm.   Pt was enrolled in TIMELESS trial prior to thrombectomy.  Small right frontal scalp hematoma   Rt groin hematoma -   Soft stool x 2 on 08/21/20  Hospital day # 5  This patient is critically ill due to left MCA stroke, aphasia, acute renal failure, recent anemia, dysphagia and at significant risk of  neurological worsening, death form renal failure, hemorrhagic conversion, recurrent stroke, sepsis, seizure. This patient's care requires constant monitoring of vital signs, hemodynamics, respiratory and cardiac monitoring, review of multiple databases, neurological assessment, discussion with family, other specialists and medical decision making of high complexity. I spent 35 minutes of neurocritical care time in the care of this patient.  Rosalin Hawking, MD PhD Stroke Neurology 08/23/2020 8:05 PM    To contact Stroke Continuity provider, please refer to [email or web address] After hours, contact General Neurology

## 2020-08-24 ENCOUNTER — Telehealth: Payer: Medicare Other

## 2020-08-24 DIAGNOSIS — D638 Anemia in other chronic diseases classified elsewhere: Secondary | ICD-10-CM

## 2020-08-24 DIAGNOSIS — I63412 Cerebral infarction due to embolism of left middle cerebral artery: Principal | ICD-10-CM

## 2020-08-24 LAB — RENAL FUNCTION PANEL
Albumin: 2.1 g/dL — ABNORMAL LOW (ref 3.5–5.0)
Anion gap: 13 (ref 5–15)
BUN: 83 mg/dL — ABNORMAL HIGH (ref 8–23)
CO2: 21 mmol/L — ABNORMAL LOW (ref 22–32)
Calcium: 8 mg/dL — ABNORMAL LOW (ref 8.9–10.3)
Chloride: 105 mmol/L (ref 98–111)
Creatinine, Ser: 5.73 mg/dL — ABNORMAL HIGH (ref 0.44–1.00)
GFR, Estimated: 7 mL/min — ABNORMAL LOW (ref >60)
Glucose, Bld: 141 mg/dL — ABNORMAL HIGH (ref 70–99)
Phosphorus: 4.2 mg/dL (ref 2.5–4.6)
Potassium: 4.8 mmol/L (ref 3.5–5.1)
Sodium: 139 mmol/L (ref 135–145)

## 2020-08-24 LAB — CBC
HCT: 25.2 % — ABNORMAL LOW (ref 36.0–46.0)
Hemoglobin: 8.1 g/dL — ABNORMAL LOW (ref 12.0–15.0)
MCH: 28.9 pg (ref 26.0–34.0)
MCHC: 32.1 g/dL (ref 30.0–36.0)
MCV: 90 fL (ref 80.0–100.0)
Platelets: 169 10*3/uL (ref 150–400)
RBC: 2.8 MIL/uL — ABNORMAL LOW (ref 3.87–5.11)
RDW: 19.4 % — ABNORMAL HIGH (ref 11.5–15.5)
WBC: 9.5 10*3/uL (ref 4.0–10.5)
nRBC: 0 % (ref 0.0–0.2)

## 2020-08-24 LAB — GLUCOSE, CAPILLARY
Glucose-Capillary: 148 mg/dL — ABNORMAL HIGH (ref 70–99)
Glucose-Capillary: 142 mg/dL — ABNORMAL HIGH (ref 70–99)
Glucose-Capillary: 173 mg/dL — ABNORMAL HIGH (ref 70–99)
Glucose-Capillary: 159 mg/dL — ABNORMAL HIGH (ref 70–99)
Glucose-Capillary: 142 mg/dL — ABNORMAL HIGH (ref 70–99)
Glucose-Capillary: 162 mg/dL — ABNORMAL HIGH (ref 70–99)

## 2020-08-24 MED ORDER — ALLOPURINOL 100 MG PO TABS: 50.0000 mg | ORAL_TABLET | ORAL | Status: DC

## 2020-08-24 MED ORDER — SODIUM CHLORIDE 0.9 % IV SOLN
250.0000 mg | Freq: Every day | INTRAVENOUS | Status: AC
  Administered 2020-08-24 – 2020-08-27 (×4): 250 mg via INTRAVENOUS
  Filled 2020-08-24 (×6): qty 20

## 2020-08-24 NOTE — Progress Notes (Signed)
  Speech Language Pathology Treatment: Dysphagia;Cognitive-Linquistic  Patient Details Name: Maria [name] MRN: [id number] DOB: 06/16/1926 Today's Date: 08/24/2020 Time: 5053-9767 SLP Time Calculation (min) (ACUTE ONLY): 18 min  Assessment / Plan / Recommendation Clinical Impression  Maria [name] smiling and pleasant throughout session for aphasia and dysphagia treatment who became drowsy  towards end. During MBS Monday, pt demonstrated significant delays for oral transit and swallow onset- major contributor to NPO recommendation. Based on subjective judgement, initiation of swallow appeared timelier with one instance coughing after second trial pudding. Honey thick teaspoons were unremarkable from pharyngeal standpoint via observation only. Verbal and visual assist provided for bolus containment and removal of labial residue was not functional this session given CN XII innervation. She will need repeat MBS. Discussed with Dr Erlinda Hong who states her kidney function is suboptimal and could defer for few days if needed but decision re: longer term nutrition needed soon. Therapist will plan on MBS tomorrow if pt appropriate.  She was able to imitate single words with good phonemic approximations. Appropriate yes/no responses to basic environmental questions. Verbal output continues with neologistic jargon type of pattern without awareness to these errors. RN tech reported pt achieved adequate tune during singing attempts to familiar holiday songs. Continue ST.      HPI HPI: Maria [name] is a 86 y.o. with a history of previous stroke, hypertension, DM, hyperlipidemia who was recently admitted after syncopal episode and found to have anemia with gastritis and erosions (started on PPI).  Developed with right-sided weakness and aphasia. Found to have cerebral infarction due to occlusion or stenosis of left middle cerebral artery. CXR clear. No prior ST notes. She had neuro change on 12/11 with MRI pending       SLP Plan  Continue with current plan of care       Recommendations  Diet recommendations: NPO Medication Administration: Via alternative means                Oral Care Recommendations: Oral care QID Follow up Recommendations: Skilled Nursing facility SLP Visit Diagnosis: Dysphagia, oropharyngeal phase (R13.12);Cognitive communication deficit (H41.937) Plan: Continue with current plan of care                       [name] 08/24/2020, 11:27 AM   [name].Ed Risk analyst [phone] Office [phone]

## 2020-08-24 NOTE — Progress Notes (Signed)
Pt experienced multiple watery stools throughout the shift. She has multiple areas of MASD as well as redness, tenderness, and skin breakdown beginning on her perianal and labial skin.  Called MD to request rectal tube to help patient avoid painful cleanups and to help preserve skin integrity. Order has been placed and rectal tube being inserted.  Boston Cookson GARNER

## 2020-08-24 NOTE — Progress Notes (Signed)
STROKE TEAM PROGRESS NOTE   INTERVAL HISTORY RN is at the bedside. Pt lying in bed, awake alert and interactive. Still has expressive more then receptive aphasia, and right facial droop with right hemiparesis. Cre up to 5.73, however, starting to have more urine outpt. nephrology on board, on foley, received albumin and lasix yesterday.  Vitals:   08/24/20 0000 08/24/20 0100 08/24/20 0200 08/24/20 0400  BP: (!) 160/67 (!) 161/62 (!) 161/78   Pulse: 87 81 81   Resp:      Temp: 98.6 F (37 C)   98.1 F (36.7 C)  TempSrc: Axillary   Axillary  SpO2: 100% 100% 100%   Weight:      Height:       CBC:  Recent Labs  Lab 08/19/20 0529 08/19/20 1200 08/20/20 0435 08/21/20 0442 08/23/20 0215 08/24/20 0101  WBC 8.3  --  7.3   < > 9.0 9.5  NEUTROABS 6.7  --  5.8  --   --   --   HGB 8.6*   < > 7.5*   < > 8.5* 8.1*  HCT 28.5*   < > 23.9*   < > 27.5* 25.2*  MCV 91.1  --  91.9   < > 90.8 90.0  PLT 224  --  196   < > 182 169   < > = values in this interval not displayed.   Basic Metabolic Panel:  Recent Labs  Lab 08/21/20 1700 08/22/20 0541 08/23/20 0215 08/24/20 0101  NA  --  140 140 139  K  --  4.1 5.0 4.8  CL  --  107 109 105  CO2  --  20* 20* 21*  GLUCOSE  --  131* 140* 141*  BUN  --  58* 74* 83*  CREATININE  --  4.43* 5.11* 5.73*  CALCIUM  --  7.8* 7.9* 8.0*  MG 1.9 1.9  --   --   PHOS 5.5* 4.8*  --  4.2   Lipid Panel:  Recent Labs  Lab 08/19/20 0529 08/23/20 0215  CHOL 104  --   TRIG 76 73  HDL 39*  --   CHOLHDL 2.7  --   VLDL 15  --   LDLCALC 50  --    HgbA1c:  Recent Labs  Lab 08/19/20 0528  HGBA1C 5.6   Urine Drug Screen:  Recent Labs  Lab 09/02/2020 1216  LABOPIA NONE DETECTED  COCAINSCRNUR NONE DETECTED  LABBENZ NONE DETECTED  AMPHETMU NONE DETECTED  THCU NONE DETECTED  LABBARB NONE DETECTED    Alcohol Level  Recent Labs  Lab 09/08/2020 1216  ETH <10    IMAGING past 24 hours No results found.  Neuro Interventional Radiology - Cerebral  Angiogram with Intervention - Dr Estanislado Pandy 09/05/2020 5:25 PM Findings. 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 2.Delated partial reconstitution of Lt MCA from Lt ACA collaterals retrogradely. Unsuccessful attempts at revascularization due to severe prox  Lt ICA tortuosity and of the aortic arch.  Transthoracic Echocardiogram  08/11/2020 IMPRESSIONS  1. Hyperdynamic LV function; proximal septal thickening with systolic anterior motion of MV; LVOT velocity of 2.5 m/s that increased to 3.5 m/s; findings c/w HOCM.  2. Left ventricular ejection fraction, by estimation, is >75%. The left ventricle has hyperdynamic function. The left ventricle has no regional wall motion abnormalities. There is mild left ventricular hypertrophy. Left ventricular diastolic parameters are consistent with Grade I diastolic dysfunction (impaired relaxation).  3. Right ventricular systolic function is normal.  The right ventricular size is normal.  4. The mitral valve is normal in structure. No evidence of mitral valve regurgitation. No evidence of mitral stenosis.  5. The aortic valve has an indeterminant number of cusps. Aortic valve regurgitation is not visualized. No aortic stenosis is present.  6. The inferior vena cava is normal in size with greater than 50% respiratory variability, suggesting right atrial pressure of 3 mmHg.   PHYSICAL EXAM   Temp:  [98.1 F (36.7 C)-99.1 F (37.3 C)] 98.1 F (36.7 C) (12/15 0400) Pulse Rate:  [77-113] 81 (12/15 0200) Resp:  [12-24] 24 (12/14 2300) BP: (123-169)/(52-106) 161/78 (12/15 0200) SpO2:  [98 %-100 %] 100 % (12/15 0200)  General - Well nourished, well developed, not in acute distress.  Ophthalmologic - fundi not visualized due to noncooperation.  Cardiovascular - Regular rhythm and rate.  Neuro - eyes open, more awake, alert, pleasant in high spirit, however, still has severe dysarthria, intangible words. Expressive > receptive aphasia. Not able to  answer orientation question, followed midline commands but not peripheral commands. Eyes midline, able to gaze bilaterally, tracking bilaterally, however, blinking to visual threat on the left but not on the right. Right facial droop, right eye closure weaker than left. Tongue protrusion to the right. LUE at least 3/5 without drift and BLE slight withdraw to pain. RUE flaccid. Bilaterally babinski positive.   ASSESSMENT/PLAN Maria [name] is a 86 y.o. female with history of previous stroke, HTN, HLD, initially admitted for acute deconditioning, gen weakness post anemia/gastritis. She was an in-house code stroke when staff noted a change in mentation. CTA showed Left M2 occlusion for which she had emergent thrombectomy. Had IR - 12/9 5:25 PM - Occluded Lt MCA inf division in the mid M2 seg. Delated partial reconstitution of Lt MCA from Lt ACA collaterals retrogradely. Unsuccessful attempts at  revascularization due to severe prox  Lt ICA tortuosity and of the aortic arch.  Stroke: L MCA moderate stroke due to left M2 occlusion with unsuccessful IR and in TIMELESS trial, likely cardioembolic etiology  Code Stroke CT head: hyperdensity along the left M2 MCA in the sylvian fissure  CTA head & neck Distal branch of M2 occlusion.   CT perfusion 32ml with missmatch  MRI  Multiple scattered tiny strokes bilat, moderate LMCA stroke, small amt of petechial hemorrhage noted in bed of infarct. Small vessel ischemic disease and old lacunar strokes noted.   Repeat CTA unchanged left M2 occlusion  2D Echo -08/11/2020 - EF > 75%. No cardiac source of emboli identified.   LDL 50  HgbA1c 5.6  UDS - negative  SARS - negative  VTE prophylaxis - post TIMELESS trial, hold for possible IV thrombolytic administration - SCDs  ASA 81mg  (but this was recently put on hold by GI for acute gastritis and anemia) prior to admission, now on ASA 325 mg daily  Therapy recommendations:  SNF  Disposition:   pending  Recent GIB with anemia  12/2 admission for syncope, GIB  Found to have severe anemia with Hb 6.9  Received PRBC   EGD done showed NSAID related gastritis and gastric ulcers and Cameron's erosions. Per GI at that time, hold ASA for2 weeks from admission (restart 08/24/20), continue BID PPI for 4 months, start PO iron in 1 week.  This admission Hb 10.2->...->7.5->8.2->8.5->8.5->8.1  CBC monitoring  Iron resumed -> receiving iron IV daily now  PPI IV bid  AKI on CKD  Cre 1.12->1.07->1.38->2.54->3.56->4.43->5.11->5.73  On IVF - NS @  50-> LR @ 50->off   More urine output now  Nephrology onboard, appreciate help  On TF with Glucerna @ 45 and Free water 100 ml Q 4 hrs  received albumin/lasix challenge 12/14.   BMP monitoring  Hypertension  Home meds:  Toprol XL 25 mg daily, Lasix 40mg    Off cleviprex  Current BP meds - metoprolol 12.5->25 bid  Stable on the high end . Long-term BP goal 130-150 given left M2 occlusion  Hyperlipidemia  Home meds:  Lipitor 40mg  and Zetia 10mg    Home meds resumed   LDL 50, goal < 70  Continue statin at discharge  Diabetes type II Controlled  Home meds:  Insulin  Now - SSI  HgbA1c 5.6, goal < 7.0  CBG monitoring  Avoid hypoglycemia  Dysphagia   Did not pass swallow or MBS 12/13  NPO  NGT -> cortrak  On TF @ 5 and FW  Speech on board  Other Stroke Risk Factors  Advanced Age >/= 98   Coronary artery disease  Congestive heart failure  Other Active Problems and Findings  Code status - Full code  Incidental finding to f/u on as out pt: Multiple thyroid nodules, the largest within the left lobe measuring 2.3 cm.   Pt was enrolled in TIMELESS trial prior to thrombectomy.  Small right frontal scalp hematoma   Rt groin hematoma -   Soft stool x 2 on 08/21/20  allopurino renal dosing  Hospital day # 6  This patient is critically ill due to left MCA stroke, aphasia, acute renal failure,  dysphagia, anemia with GIB and at significant risk of neurological worsening, death form recurrent stroke, renal failure, aspiration, shock, seizure. This patient's care requires constant monitoring of vital signs, hemodynamics, respiratory and cardiac monitoring, review of multiple databases, neurological assessment, discussion with family, other specialists and medical decision making of high complexity. I spent 35 minutes of neurocritical care time in the care of this patient.  Rosalin Hawking, MD PhD Stroke Neurology 08/24/2020 12:18 PM       To contact Stroke Continuity provider, please refer to [email or web address] After hours, contact General Neurology

## 2020-08-24 NOTE — Progress Notes (Signed)
Occupational Therapy Treatment Patient Details Name: Maria [name] MRN: [id number] DOB: 06/24/1926 Today's Date: 08/24/2020    History of present illness 86 yo female back to ED with inability to ambulate, weakness, pain. Hx of severe OA R knee, DM, CKD, HF, OSA, syncope (she discharged home from [facility] on 12/7 as she refused SNF placement, but returned that pm due to inability to care for self.  While waiting in ED for placement on 12/9 she was noted to have Rt sided weakness, and decreased ability to communicate. CTA showed M2 occlusion.  Attempt at revascularization was unsuccessful.   OT comments  Pt is progressing toward OT goals.  She is able to assist with basic grooming tasks.  She follows commands consistently.   She requires max A for bed mobility.  No volitional movement noted Rt side, but possibly noted increase in tone with attempts at movement.  Will continue to follow.   Follow Up Recommendations  SNF    Equipment Recommendations  None recommended by OT    Recommendations for Other Services      Precautions / Restrictions Precautions Precautions: Fall Precaution Comments: h/o syncope during past hospitalization, NG tube, mitt on Left hand       Mobility Bed Mobility Overal bed mobility: Needs Assistance Bed Mobility: Rolling Rolling: Max assist         General bed mobility comments: Pt requires max A to roll Lt and Rt  Transfers                 General transfer comment: did not attempt this date    Balance Overall balance assessment: Needs assistance Sitting-balance support: Feet unsupported;Single extremity supported Sitting balance-Leahy Scale: Poor Sitting balance - Comments: Pt moved into chair position . Avoided moving to EOB due to buttocks and peri area red and painful  She requires min A to correct posture due to Rt lateral lean                                   ADL either performed or assessed with clinical judgement   ADL  Overall ADL's : Needs assistance/impaired     Grooming: Oral care;Moderate assistance;Bed level (bed level) Grooming Details (indicate cue type and reason): Pt washed face with min A and min cues to thoroughly wash Rt side of face.  She performed oral care with max hand over hand assist.  she demonstrates difficulty orienting hand correctly to move toothette around her mouth                     Toileting- Clothing Manipulation and Hygiene: Total assistance;Bed level Toileting - Clothing Manipulation Details (indicate cue type and reason): Pt incontinent of loose stool  Assisted with peri care in supine             Vision   Additional Comments: Pt tracks therapist Lt and Rt   Perception     Praxis      Cognition Arousal/Alertness: Awake/alert Behavior During Therapy: WFL for tasks assessed/performed Overall Cognitive Status: Difficult to assess                                 General Comments: Pt with significant communication deficits, but does follow one step commands fairly consistently.  She attempts to communicate and is very interactive  Exercises Exercises: Other exercises Other Exercises Other Exercises: attempted AAROM Rt UE shoulder flexion/extension.  No active movement noted, but pt assisted with her Lt UE   Shoulder Instructions       General Comments VSS    Pertinent Vitals/ Pain       Pain Assessment: Faces Faces Pain Scale: Hurts whole lot Pain Location: buttocks and peri area Pain Descriptors / Indicators: Grimacing;Guarding;Moaning Pain Intervention(s): Monitored during session;Repositioned;Limited activity within patient's tolerance  Home Living                                          Prior Functioning/Environment              Frequency  Min 2X/week        Progress Toward Goals  OT Goals(current goals can now be found in the care plan section)  Progress towards OT goals: Progressing  toward goals     Plan Discharge plan remains appropriate    Co-evaluation                 AM-PAC OT "6 Clicks" Daily Activity     Outcome Measure   Help from another person eating meals?: Total Help from another person taking care of personal grooming?: A Lot Help from another person toileting, which includes using toliet, bedpan, or urinal?: Total Help from another person bathing (including washing, rinsing, drying)?: Total Help from another person to put on and taking off regular upper body clothing?: Total Help from another person to put on and taking off regular lower body clothing?: Total 6 Click Score: 7    End of Session    OT Visit Diagnosis: Hemiplegia and hemiparesis Hemiplegia - Right/Left: Right Hemiplegia - dominant/non-dominant: Dominant Hemiplegia - caused by: Cerebral infarction Pain - part of body:  (buttocks)   Activity Tolerance Patient tolerated treatment well   Patient Left in bed;with call bell/phone within reach   Nurse Communication Mobility status        Time: 8916-9450 OT Time Calculation (min): 38 min  Charges: OT General Charges $OT Visit: 1 Visit OT Treatments $Self Care/Home Management : 23-37 mins $Neuromuscular Re-education: 8-22 mins  [name]., OTR/L Acute Rehabilitation Services Pager [phone] Office [phone]    [name] 08/24/2020, 9:49 AM

## 2020-08-24 NOTE — Progress Notes (Addendum)
Patient ID: Maria [name], female   DOB: June 16, 1926, 86 y.o.   MRN: [id number] [facility] Progress Note   Assessment/ Plan:   1. Acute kidney Injury: This is suspected likely to be from contrast-induced nephropathy given the timeline of events. Anuric but with some indication of improving output--will keep Foley in place for another 24-48hr while monitoring for renal recovery. She has worsening BUN/creatinine but without any acute indications for dialysis at this time.  2. Hypertension: Blood pressures currently managed with metoprolol and clevidipine per neurology targets to limit extension of penumbra/infarct. 3. 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 4. Anemia: Low Tsat- will give IV Fe. At the current blood pressures, will avoid ESA. 5. [place] metabolic acidosis: Bicarbonate level within acceptable range and without any respiratory compromise. Continue NaHCO3 via NGT.   Subjective:   Overnight without acute events- negligible response to albumin/lasix trial.   Objective:   BP (!) 155/72   Pulse 87   Temp 98.3 F (36.8 C) (Oral)   Resp (!) 24   Ht 4\' 11"  (1.499 m)   Wt 74 kg   SpO2 99%   BMI 32.95 kg/m   Intake/Output Summary (Last 24 hours) at 08/24/2020 0943 Last data filed at 08/24/2020 0900 Gross per 24 hour  Intake 1524.34 ml  Output 45 ml  Net 1479.34 ml   Weight change:   Physical Exam: Gen: Appears comfortable in bed- currently in chair position. Nodding to questions and able to smile and frown to comments.  CVS: Pulse regular rhythm, normal rate, S1 and S2 normal Resp: Clear to auscultation bilaterally, no distinct rales or rhonchi Abd: Soft, flat, nontender, bowel sounds normal Ext: No lower extremity edema noted  Imaging: DG Swallowing Func-Speech Pathology  Result Date: 08/22/2020 Objective Swallowing  Evaluation: Type of Study: MBS-Modified Barium Swallow Study  Patient Details Name: Maria [name] MRN: [id number] Date of Birth: 1926/03/27 Today's Date: 08/22/2020 Time: SLP Start Time (ACUTE ONLY): 1345 -SLP Stop Time (ACUTE ONLY): 1405 SLP Time Calculation (min) (ACUTE ONLY): 20 min Past Medical History: Past Medical History: Diagnosis Date . Anxiety  . Arthritis  . Blood transfusion  . Depression  . Diabetes mellitus  . Edema  . Heart murmur  . Hyperlipidemia  . Hypertension  . Sleep apnea   cpap . Stroke ([place])   3 x tias . TIA (transient ischemic attack)  Past Surgical History: Past Surgical History: Procedure Laterality Date . ABDOMINAL HYSTERECTOMY   . APPENDECTOMY   . BIOPSY  08/13/2020  Procedure: BIOPSY;  Surgeon: Rush Landmark Telford Nab., MD;  Location: [facility];  Service: Gastroenterology;; . ESOPHAGOGASTRODUODENOSCOPY (EGD) WITH PROPOFOL N/A 08/13/2020  Procedure: ESOPHAGOGASTRODUODENOSCOPY (EGD) WITH PROPOFOL;  Surgeon: Irving Copas., MD;  Location: [facility];  Service: Gastroenterology;  Laterality: N/A; . HEMOSTASIS CLIP PLACEMENT  08/13/2020  Procedure: HEMOSTASIS CLIP PLACEMENT;  Surgeon: Irving Copas., MD;  Location: [facility];  Service: Gastroenterology;; . IR PERCUTANEOUS ART THROMBECTOMY/INFUSION INTRACRANIAL INC DIAG ANGIO  09/01/2020 . NM MYOVIEW LTD  [id number]  post stress left ventricle is normal in size, ejection fraction is 65%, normal myocardial perfusion study, low risk scan . PARTIAL HYSTERECTOMY   . RADIOLOGY WITH ANESTHESIA N/A 08/26/2020  Procedure: IR WITH ANESTHESIA - CODE STROKE;  Surgeon: Luanne Bras, MD;  Location: [facility];  Service: Radiology;  Laterality: N/A; . TRANSTHORACIC ECHOCARDIOGRAM  [id number] HPI: ALYCEN MACK is a 86 y.o. with a  history of previous stroke, hypertension, DM, hyperlipidemia who was recently admitted after syncopal episode and found to have anemia with gastritis and erosions (started on PPI).  Developed with right-sided  weakness and aphasia. Found to have cerebral infarction due to occlusion or stenosis of left middle cerebral artery. CXR clear. No prior ST notes. She had neuro change on 12/11 with MRI pending  No data recorded Assessment / Plan / Recommendation CHL IP CLINICAL IMPRESSIONS 08/22/2020 Clinical Impression Oropharyngeal dysphagia is mild-moderate and marked by oral transit delays, residue, reduced propulsion and mistimed swallow onset. Combination of reduced lingual ROM/sensation led to significantly delayed transit to pharynx up to 3 minutes. Excessive right bucal cavity retention and anterior spillage without awareness. All trials reached her valleculae and sat for greater than 8-10 seconds before swallow initiated. Nectar thick was penetrated to her vocal cords, without response and almost exited vestibule but slight remained at petiole of epiglottis. Mild-mod vallecular and pyriform sinus residue with honey thick without penetration/aspiration. Given delays in oral and pharyngeal transit she would likely be unable to maintain nurtitional needs. Recommend continue NPO and therapist willl continue intervention. SLP Visit Diagnosis Dysphagia, oropharyngeal phase (R13.12) Attention and concentration deficit following -- Frontal lobe and executive function deficit following -- Impact on safety and function Moderate aspiration risk;Severe aspiration risk   CHL IP TREATMENT RECOMMENDATION 08/22/2020 Treatment Recommendations Therapy as outlined in treatment plan below   Prognosis 08/22/2020 Prognosis for Safe Diet Advancement (No Data) Barriers to Reach Goals Severity of deficits Barriers/Prognosis Comment -- CHL IP DIET RECOMMENDATION 08/22/2020 SLP Diet Recommendations NPO Liquid Administration via -- Medication Administration Via alternative means Compensations -- Postural Changes --   CHL IP OTHER RECOMMENDATIONS 08/22/2020 Recommended Consults -- Oral Care Recommendations Oral care QID Other Recommendations --   CHL  IP FOLLOW UP RECOMMENDATIONS 08/22/2020 Follow up Recommendations Skilled Nursing facility   [facility] IP FREQUENCY AND DURATION 08/22/2020 Speech Therapy Frequency (ACUTE ONLY) min 2x/week Treatment Duration 2 weeks      CHL IP ORAL PHASE 08/22/2020 Oral Phase Impaired Oral - Pudding Teaspoon -- Oral - Pudding Cup -- Oral - Honey Teaspoon Decreased bolus cohesion;Delayed oral transit;Lingual/palatal residue;Right pocketing in lateral sulci;Right anterior bolus loss Oral - Honey Cup Decreased bolus cohesion;Delayed oral transit;Lingual/palatal residue;Right pocketing in lateral sulci;Right anterior bolus loss Oral - Nectar Teaspoon Decreased bolus cohesion;Delayed oral transit;Lingual/palatal residue;Right pocketing in lateral sulci;Right anterior bolus loss Oral - Nectar Cup NT Oral - Nectar Straw -- Oral - Thin Teaspoon -- Oral - Thin Cup -- Oral - Thin Straw -- Oral - Puree Decreased bolus cohesion;Delayed oral transit;Lingual/palatal residue;Right pocketing in lateral sulci Oral - Mech Soft -- Oral - Regular -- Oral - Multi-Consistency -- Oral - Pill -- Oral Phase - Comment --  CHL IP PHARYNGEAL PHASE 08/22/2020 Pharyngeal Phase Impaired Pharyngeal- Pudding Teaspoon -- Pharyngeal -- Pharyngeal- Pudding Cup -- Pharyngeal -- Pharyngeal- Honey Teaspoon Delayed swallow initiation-vallecula;Pharyngeal residue - valleculae;Pharyngeal residue - pyriform Pharyngeal -- Pharyngeal- Honey Cup Delayed swallow initiation-vallecula;Pharyngeal residue - valleculae;Pharyngeal residue - pyriform Pharyngeal -- Pharyngeal- Nectar Teaspoon Penetration/Aspiration during swallow;Delayed swallow initiation-vallecula;Pharyngeal residue - valleculae Pharyngeal Material enters airway, CONTACTS cords and not ejected out Pharyngeal- Nectar Cup NT Pharyngeal -- Pharyngeal- Nectar Straw -- Pharyngeal -- Pharyngeal- Thin Teaspoon -- Pharyngeal -- Pharyngeal- Thin Cup -- Pharyngeal -- Pharyngeal- Thin Straw -- Pharyngeal -- Pharyngeal- Puree Delayed  swallow initiation-vallecula;Pharyngeal residue - valleculae Pharyngeal -- Pharyngeal- Mechanical Soft -- Pharyngeal -- Pharyngeal- Regular -- Pharyngeal -- Pharyngeal- Multi-consistency -- Pharyngeal -- Pharyngeal-  Pill -- Pharyngeal -- Pharyngeal Comment --  CHL IP CERVICAL ESOPHAGEAL PHASE 08/22/2020 Cervical Esophageal Phase WFL Pudding Teaspoon -- Pudding Cup -- Honey Teaspoon -- Honey Cup -- Nectar Teaspoon -- Nectar Cup -- Nectar Straw -- Thin Teaspoon -- Thin Cup -- Thin Straw -- Puree -- Mechanical Soft -- Regular -- Multi-consistency -- Pill -- Cervical Esophageal Comment -- [name] 08/22/2020, 2:58 PM [name].Ed CCC-SLP Speech-Language Pathologist Pager [phone] Office [phone]               Labs: BMET Recent Labs  Lab 08/31/2020 1216 08/20/2020 1226 08/19/20 0529 08/20/20 0435 08/20/20 1757 08/21/20 0442 08/21/20 1700 08/22/20 0541 08/23/20 0215 08/24/20 0101  NA 139 139 137 139  --  141  --  140 140 139  K 3.6 3.6 3.5 3.7  --  3.9  --  4.1 5.0 4.8  CL 102 101 102 108  --  110  --  107 109 105  CO2 24  --  21* 20*  --  19*  --  20* 20* 21*  GLUCOSE 115* 112* 92 105*  --  118*  --  131* 140* 141*  BUN 30* 27* 27* 34*  --  44*  --  58* 74* 83*  CREATININE 1.28* 1.30* 1.38* 2.54*  --  3.56*  --  4.43* 5.11* 5.73*  CALCIUM 8.9  --  8.2* 8.0*  --  8.1*  --  7.8* 7.9* 8.0*  PHOS  --   --   --   --  5.6* 6.1* 5.5* 4.8*  --  4.2   CBC Recent Labs  Lab 09/07/2020 1216 08/30/2020 1226 08/19/20 0529 08/19/20 1200 08/20/20 0435 08/21/20 0442 08/22/20 0541 08/23/20 0215 08/24/20 0101  WBC 9.2  --  8.3  --  7.3 8.9 8.7 9.0 9.5  NEUTROABS 7.5  --  6.7  --  5.8  --   --   --   --   HGB 10.2*   < > 8.6*   < > 7.5* 8.2* 8.5* 8.5* 8.1*  HCT 32.8*   < > 28.5*   < > 23.9* 27.0* 27.7* 27.5* 25.2*  MCV 90.9  --  91.1  --  91.9 91.5 90.8 90.8 90.0  PLT 196  --  224  --  196 216 248 182 169   < > = values in this interval not displayed.    Medications:    .  allopurinol  200 mg Per Tube Daily  . aspirin  325 mg Per Tube Daily  . atorvastatin  40 mg Per Tube Daily  . Chlorhexidine Gluconate Cloth  6 each Topical Daily  . donepezil  10 mg Per Tube QPM  . ezetimibe  10 mg Per Tube QHS  . feeding supplement (PROSource TF)  45 mL Per Tube BID  . ferrous sulfate  300 mg Per Tube BID WC  . folic acid-pyridoxine-cyancobalamin  1 tablet Per Tube Daily  . free water  100 mL Per Tube Q4H  . insulin aspart  0-15 Units Subcutaneous Q4H  . mouth rinse  15 mL Mouth Rinse BID  . metoprolol tartrate  25 mg Per Tube BID  . pantoprazole sodium  40 mg Per Tube Daily  . sodium bicarbonate  650 mg Per NG tube TID   Elmarie Shiley, MD 08/24/2020, 9:43 AM

## 2020-08-25 DIAGNOSIS — N170 Acute kidney failure with tubular necrosis: Secondary | ICD-10-CM

## 2020-08-25 LAB — CBC
HCT: 24 % — ABNORMAL LOW (ref 36.0–46.0)
Hemoglobin: 7.9 g/dL — ABNORMAL LOW (ref 12.0–15.0)
MCH: 29.3 pg (ref 26.0–34.0)
MCHC: 32.9 g/dL (ref 30.0–36.0)
MCV: 88.9 fL (ref 80.0–100.0)
Platelets: 177 10*3/uL (ref 150–400)
RBC: 2.7 MIL/uL — ABNORMAL LOW (ref 3.87–5.11)
RDW: 19.9 % — ABNORMAL HIGH (ref 11.5–15.5)
WBC: 9.4 10*3/uL (ref 4.0–10.5)
nRBC: 0.2 % (ref 0.0–0.2)

## 2020-08-25 LAB — GLUCOSE, CAPILLARY
Glucose-Capillary: 162 mg/dL — ABNORMAL HIGH (ref 70–99)
Glucose-Capillary: 183 mg/dL — ABNORMAL HIGH (ref 70–99)
Glucose-Capillary: 187 mg/dL — ABNORMAL HIGH (ref 70–99)
Glucose-Capillary: 186 mg/dL — ABNORMAL HIGH (ref 70–99)
Glucose-Capillary: 160 mg/dL — ABNORMAL HIGH (ref 70–99)
Glucose-Capillary: 165 mg/dL — ABNORMAL HIGH (ref 70–99)
Glucose-Capillary: 154 mg/dL — ABNORMAL HIGH (ref 70–99)

## 2020-08-25 LAB — RENAL FUNCTION PANEL
Albumin: 2 g/dL — ABNORMAL LOW (ref 3.5–5.0)
Anion gap: 11 (ref 5–15)
BUN: 95 mg/dL — ABNORMAL HIGH (ref 8–23)
CO2: 21 mmol/L — ABNORMAL LOW (ref 22–32)
Calcium: 8.3 mg/dL — ABNORMAL LOW (ref 8.9–10.3)
Chloride: 109 mmol/L (ref 98–111)
Creatinine, Ser: 5.83 mg/dL — ABNORMAL HIGH (ref 0.44–1.00)
GFR, Estimated: 6 mL/min — ABNORMAL LOW (ref >60)
Glucose, Bld: 162 mg/dL — ABNORMAL HIGH (ref 70–99)
Phosphorus: 3.8 mg/dL (ref 2.5–4.6)
Potassium: 4.7 mmol/L (ref 3.5–5.1)
Sodium: 141 mmol/L (ref 135–145)

## 2020-08-25 MED ORDER — DARBEPOETIN ALFA 60 MCG/0.3ML IJ SOSY
60.0000 ug | PREFILLED_SYRINGE | INTRAMUSCULAR | Status: DC
  Filled 2020-08-25: qty 0.3

## 2020-08-25 MED ORDER — METOPROLOL TARTRATE 12.5 MG HALF TABLET
12.5000 mg | ORAL_TABLET | Freq: Two times a day (BID) | ORAL | Status: DC
  Administered 2020-08-25 – 2020-09-01 (×14): 12.5 mg
  Filled 2020-08-25 (×14): qty 1

## 2020-08-25 NOTE — Progress Notes (Signed)
Maria [name] a 86 yrs old AA female with rt side weakness ,flaccid with no movement, with severe expressive  Aphasia a transfer  from 4N ICU Pt l alert

## 2020-08-25 NOTE — Progress Notes (Signed)
STROKE TEAM PROGRESS NOTE   INTERVAL HISTORY Granddaughter Tonya at bedside.  Patient seems not as awake alert interactive as yesterday, somehow lethargic with intermittent restless.  Urine output still limited, creatinine slightly increased from yesterday.  Electrolytes and BP stable.  Intermittent tachycardia but brief duration.  Vitals:   08/25/20 0100 08/25/20 0228 08/25/20 0354 08/25/20 0823  BP: 136/79 (!) 151/90 132/68 (!) 146/63  Pulse: 95 89 87 94  Resp: 19 19 18 20   Temp:  97.9 F (36.6 C) 99.4 F (37.4 C) 98.4 F (36.9 C)  TempSrc:  Oral Oral Oral  SpO2: 97% 98% 97% 99%  Weight:      Height:       CBC:  Recent Labs  Lab 08/19/20 0529 08/19/20 1200 08/20/20 0435 08/21/20 0442 08/24/20 0101 08/25/20 0025  WBC 8.3  --  7.3   < > 9.5 9.4  NEUTROABS 6.7  --  5.8  --   --   --   HGB 8.6*   < > 7.5*   < > 8.1* 7.9*  HCT 28.5*   < > 23.9*   < > 25.2* 24.0*  MCV 91.1  --  91.9   < > 90.0 88.9  PLT 224  --  196   < > 169 177   < > = values in this interval not displayed.   Basic Metabolic Panel:  Recent Labs  Lab 08/21/20 1700 08/22/20 0541 08/23/20 0215 08/24/20 0101 08/25/20 0025  NA  --  140   < > 139 141  K  --  4.1   < > 4.8 4.7  CL  --  107   < > 105 109  CO2  --  20*   < > 21* 21*  GLUCOSE  --  131*   < > 141* 162*  BUN  --  58*   < > 83* 95*  CREATININE  --  4.43*   < > 5.73* 5.83*  CALCIUM  --  7.8*   < > 8.0* 8.3*  MG 1.9 1.9  --   --   --   PHOS 5.5* 4.8*  --  4.2 3.8   < > = values in this interval not displayed.   Lipid Panel:  Recent Labs  Lab 08/19/20 0529 08/23/20 0215  CHOL 104  --   TRIG 76 73  HDL 39*  --   CHOLHDL 2.7  --   VLDL 15  --   LDLCALC 50  --    HgbA1c:  Recent Labs  Lab 08/19/20 0528  HGBA1C 5.6   Urine Drug Screen:  Recent Labs  Lab 08/15/2020 1216  LABOPIA NONE DETECTED  COCAINSCRNUR NONE DETECTED  LABBENZ NONE DETECTED  AMPHETMU NONE DETECTED  THCU NONE DETECTED  LABBARB NONE DETECTED    Alcohol  Level  Recent Labs  Lab 08/13/2020 1216  ETH <10    IMAGING past 24 hours No results found.   PHYSICAL EXAM   Temp:  [97.9 F (36.6 C)-99.4 F (37.4 C)] 98.4 F (36.9 C) (12/16 0823) Pulse Rate:  [79-100] 94 (12/16 0823) Resp:  [0-20] 20 (12/16 0823) BP: (121-163)/(50-90) 146/63 (12/16 0823) SpO2:  [94 %-99 %] 99 % (12/16 0823)  General - Well nourished, well developed, not in acute distress, mildly lethargic and restless.  Ophthalmologic - fundi not visualized due to noncooperation.  Cardiovascular - Regular rhythm and rate.  Neuro - eyes open, mildly lethargic and restless, still has severe dysarthria, intangible words. Expressive >  receptive aphasia. Not able to answer orientation question, followed midline commands but not peripheral commands. Eyes midline, able to gaze bilaterally, tracking bilaterally, however, blinking to visual threat on the left but not on the right. Right facial droop, right eye closure weaker than left. Tongue protrusion to the right. LUE at least 3/5 without drift and BLE slight withdraw to pain. RUE flaccid. Bilaterally babinski positive.   ASSESSMENT/PLAN [name] is a 86 y.o. female with history of previous stroke, HTN, HLD, initially admitted for acute deconditioning, gen weakness post anemia/gastritis. She was an in-house code stroke when staff noted a change in mentation. CTA showed Left M2 occlusion for which she had emergent thrombectomy. Had IR - 12/9 5:25 PM - Occluded Lt MCA inf division in the mid M2 seg. Delated partial reconstitution of Lt MCA from Lt ACA collaterals retrogradely. Unsuccessful attempts at  revascularization due to severe prox  Lt ICA tortuosity and of the aortic arch.  Stroke: L MCA moderate stroke due to left M2 occlusion with unsuccessful IR and in TIMELESS trial, likely cardioembolic etiology  Code Stroke CT head: hyperdensity along the left M2 MCA in the sylvian fissure  CTA head & neck Distal branch of M2  occlusion.   CT perfusion 56ml with missmatch  MRI  Multiple scattered tiny strokes bilat, moderate LMCA stroke, small amt of petechial hemorrhage noted in bed of infarct. Small vessel ischemic disease and old lacunar strokes noted.   Repeat CTA unchanged left M2 occlusion  2D Echo -08/11/2020 - EF > 75%. No cardiac source of emboli identified.   LDL 50  HgbA1c 5.6  UDS - negative  SARS - negative  VTE prophylaxis - post TIMELESS trial, hold for possible IV thrombolytic administration - SCDs  ASA 81mg  (but this was recently put on hold by GI for acute gastritis and anemia) prior to admission, now on ASA 325 mg daily  Therapy recommendations:  SNF  Disposition:  pending  Recent GIB with anemia  12/2 admission for syncope, GIB  Found to have severe anemia with Hb 6.9  Received PRBC   EGD done showed NSAID related gastritis and gastric ulcers and Cameron's erosions. Per GI at that time, hold ASA for2 weeks from admission (restart 08/24/20), continue BID PPI for 4 months, start PO iron in 1 week.  This admission Hb 10.2->...->7.5->8.2->8.5->8.5->8.1->7.9  CBC monitoring  Iron resumed -> receiving iron IV daily now  PPI IV bid  AKI on CKD  Likely d/t contrast nephropathy  Cre 1.12->1.07->1.38->2.54->3.56->4.43->5.11->5.73->5.83  On IVF - NS @ 50-> LR @ 50->off   More urine output now  Nephrology onboard, appreciate help  On TF with Glucerna @ 45 and Free water 100 ml Q 4 hrs  received albumin/lasix challenge 12/14.   BMP monitoring  Trying to avoid dialysis  Hypertension  Home meds:  Toprol XL 25 mg daily, Lasix 40mg    Off cleviprex  Current BP meds - metoprolol 12.5->25->12.5 bid  Stable on the high end . Long-term BP goal 130-150 given left M2 occlusion  Hyperlipidemia  Home meds:  Lipitor 40mg  and Zetia 10mg    Home meds resumed   LDL 50, goal < 70  Continue statin at discharge  Diabetes type II Controlled  Home meds:  Insulin  Now  - SSI  HgbA1c 5.6, goal < 7.0  CBG monitoring  Avoid hypoglycemia  Dysphagia   Did not pass swallow or MBS 12/13  Plan for repeat MBS tomorrow  NPO  NGT -> cortrak  On TF @ 36 and FW  Speech on board  Other Stroke Risk Factors  Advanced Age >/= 60   Coronary artery disease  Congestive heart failure  Other Active Problems and Findings  Incidental finding to f/u on as out pt: Multiple thyroid nodules, the largest within the left lobe measuring 2.3 cm.   Pt was enrolled in TIMELESS trial prior to thrombectomy.  Small right frontal scalp hematoma   Rt groin hematoma -   Soft stool x 2 on 08/21/20  allopurino renal dosing  Hospital day # 7  I had long discussion with granddaughter at bedside, updated pt current condition, treatment plan and potential prognosis, and answered all the questions.  She expressed understanding and appreciation.    Rosalin Hawking, MD PhD Stroke Neurology 08/25/2020 9:08 AM  To contact Stroke Continuity provider, please refer to [email or web address] After hours, contact General Neurology

## 2020-08-25 NOTE — Progress Notes (Signed)
Physical Therapy Treatment Patient Details Name: Maria [name] MRN: [id number] DOB: 1926/06/18 Today's Date: 08/25/2020    History of Present Illness 86 yo female back to ED with inability to ambulate, weakness, pain. Hx of severe OA R knee, DM, CKD, HF, OSA, syncope (she discharged home from [facility] on 12/7 as she refused SNF placement, but returned that pm due to inability to care for self.  While waiting in ED for placement on 12/9 she was noted to have Rt sided weakness, and decreased ability to communicate. CTA showed M2 occlusion.  Attempt at revascularization was unsuccessful.    PT Comments    Pt continues to have difficulty forming words but continues to attempt to talk and performs head nods to indicate 'yes' to questions. Pt limited in mobility by pain and declined to sit EOB this date. Focused session on activating muscles in trunk/core and bilat legs and arms. Performed supine > sit long-sitting in bed with HOB elevated and lateral trunk flexion supine in bed in bilat directions to encourage core muscle activation, with pt activating L side but not R. Facilitated muscle activation in limbs through performing PNF D1 flexion and extension 5x in all limbs, with pt not activating R limbs and only activating L limbs min-mod. However, she demonstrates resistance with L elbow and knee flexion. She also displays risk for R shoulder subluxation, thus positioned at end of session with towels and pillow supporting shoulder. Will continue to follow acutely. Current recommendations remain appropriate.  Follow Up Recommendations  SNF     Equipment Recommendations  None recommended by PT    Recommendations for Other Services       Precautions / Restrictions Precautions Precautions: Fall Precaution Comments: h/o syncope during past hospitalization, NG tube Restrictions Weight Bearing Restrictions: No    Mobility  Bed Mobility Overal bed mobility: Needs Assistance Bed Mobility: Supine to  Sit     Supine to sit: Mod assist;HOB elevated     General bed mobility comments: Supine to sit long-sitting in bed with HOB elevated to encourage core muscle activation, 2x5 reps with modA at trunk to complete with pt primarily activating with L core musculature. Transitioning shoulders laterally L and R 5x with maxA to encourage core activation. Pt declined getting up to sit EOB due to pain.  Transfers                 General transfer comment: did not attempt this date  Ambulation/Gait             General Gait Details: Unable to attempt safely.   Stairs             Wheelchair Mobility    Modified Rankin (Stroke Patients Only) Modified Rankin (Stroke Patients Only) Pre-Morbid Rankin Score: Moderately severe disability Modified Rankin: Severe disability     Balance Overall balance assessment: Needs assistance     Sitting balance - Comments: Long-sitting in bed with modA for brief periods of time before returning to supine with HOB elevated.       Standing balance comment: Deferred due to safety concerns.                            Cognition Arousal/Alertness: Awake/alert Behavior During Therapy: WFL for tasks assessed/performed Overall Cognitive Status: Difficult to assess  General Comments: Pt with significant communication deficits, but does follow one step commands fairly consistently.  She attempts to communicate and is very [employer] Other Exercises Other Exercises: PNF D1 flexion and extension performed on bilat UEs, PROM on R with no muscle activation noted, and AAROM on L with pt resisting elbow flexion, 5x each Other Exercises: PNF D1 flexion and extension performed on bilat legs, PROM on R with no muscle activation noted, and AAROM on L with pt resisting knee flexion, 5x each    General Comments General comments (skin integrity, edema, etc.): Positioned pt with  heels floating with pillow udner lower legs at end of session along with pillow and towel roll under R shoulder and elbow to support it and dec pain      Pertinent Vitals/Pain Pain Assessment: Faces Faces Pain Scale: Hurts even more Pain Location: generalized Pain Descriptors / Indicators: Discomfort;Grimacing Pain Intervention(s): Limited activity within patient's tolerance;Monitored during session;Repositioned    Home Living                      Prior Function            PT Goals (current goals can now be found in the care plan section) Acute Rehab PT Goals Patient Stated Goal: to get better PT Goal Formulation: With patient Time For Goal Achievement: 08/31/20 Potential to Achieve Goals: Fair Progress towards PT goals: Progressing toward goals (slowly)    Frequency    Min 2X/week      PT Plan Current plan remains appropriate    Co-evaluation              AM-PAC PT "6 Clicks" Mobility   Outcome Measure  Help needed turning from your back to your side while in a flat bed without using bedrails?: A Lot Help needed moving from lying on your back to sitting on the side of a flat bed without using bedrails?: A Lot Help needed moving to and from a bed to a chair (including a wheelchair)?: Total Help needed standing up from a chair using your arms (e.g., wheelchair or bedside chair)?: Total Help needed to walk in hospital room?: Total Help needed climbing 3-5 steps with a railing? : Total 6 Click Score: 8    End of Session   Activity Tolerance: Patient tolerated treatment well;Patient limited by pain Patient left: in bed;with call bell/phone within reach;with bed alarm set Nurse Communication: Mobility status PT Visit Diagnosis: Other abnormalities of gait and mobility (R26.89);Muscle weakness (generalized) (M62.81);Difficulty in walking, not elsewhere classified (R26.2)     Time: 2426-8341 PT Time Calculation (min) (ACUTE ONLY): 26 min  Charges:   $Neuromuscular Re-education: 23-37 mins                     Moishe Spice, PT, DPT Acute Rehabilitation Services  Pager: [phone] Office: [place] 08/25/2020, 2:50 PM

## 2020-08-25 NOTE — Progress Notes (Deleted)
Office Visit Note  Patient: Maria [name]             Date of Birth: 1925/10/22           MRN: [id number]             PCP: Glendale Chard, MD Referring: Glendale Chard, MD Visit Date: 09/07/2020 Occupation: @GUAROCC @  Subjective:  No chief complaint on file.   History of Present Illness: Maria [name] is a 86 y.o. female ***   Activities of Daily Living:  Patient reports morning stiffness for *** {minute/hour:19697}.   Patient {ACTIONS;DENIES/REPORTS:21021675::"Denies"} nocturnal pain.  Difficulty dressing/grooming: {ACTIONS;DENIES/REPORTS:21021675::"Denies"} Difficulty climbing stairs: {ACTIONS;DENIES/REPORTS:21021675::"Denies"} Difficulty getting out of chair: {ACTIONS;DENIES/REPORTS:21021675::"Denies"} Difficulty using hands for taps, buttons, cutlery, and/or writing: {ACTIONS;DENIES/REPORTS:21021675::"Denies"}  No Rheumatology ROS completed.   PMFS History:  Patient Active Problem List   Diagnosis Date Noted  . Pressure injury of skin 08/23/2020  . Stroke (cerebrum) ([place]) 08/22/2020  . Middle cerebral artery embolism, left 08/15/2020  . Chronic diastolic CHF (congestive heart failure) ([place]) 09/06/2020  . Gastritis 09/01/2020  . Iron deficiency anemia 09/09/2020  . Acute renal failure superimposed on stage 3b chronic kidney disease ([place]) 08/30/2020  . Seronegative rheumatoid arthritis ([place]) 06/02/2020  . Chronic renal disease, stage 3, moderately decreased glomerular filtration rate between 30-59 mL/min/1.73 square meter ([place]) 01/08/2020  . Hypertensive heart and kidney disease with chronic diastolic congestive heart failure and stage 3 chronic kidney disease ([place]) 01/06/2020  . Primary osteoarthritis of right knee 01/06/2020  . Joint pain 12/03/2019  . Hav (hallux abducto valgus), right 11/04/2019  . Diabetic peripheral vascular disease ([place]) 09/15/2019  . Gout 07/02/2019  . Insulin dependent type 2 diabetes mellitus ([place]) 07/02/2019  . Acute on chronic  diastolic CHF (congestive heart failure) ([place]) 03/26/2018  . Acute on chronic renal insufficiency 03/26/2018  . Dyspnea on exertion 02/14/2018  . Palpitations 02/13/2017  . Chest pain 03/25/2015  . Acute diverticulitis 03/04/2013  . Bilateral lower extremity edema 01/23/2013  . Hyperlipidemia 01/23/2013  . Diverticulitis 01/31/2012  . Diabetes mellitus ([place]) 01/31/2012  . Essential hypertension 01/31/2012  . Obstructive sleep apnea on CPAP 01/31/2012  . History of TIAs 01/31/2012  . Obesity 01/31/2012  . H/O vertigo 01/31/2012    Past Medical History:  Diagnosis Date  . Anxiety   . Arthritis   . Blood transfusion   . Depression   . Diabetes mellitus   . Edema   . Heart murmur   . Hyperlipidemia   . Hypertension   . Sleep apnea    cpap  . Stroke ([place])    3 x tias  . TIA (transient ischemic attack)     Family History  Problem Relation Age of Onset  . Healthy Mother   . Prostate cancer Father   . Cervical cancer Granddaughter   . Heart Problems Son   . Prostate cancer Son   . Neuropathy Neg Hx    Past Surgical History:  Procedure Laterality Date  . ABDOMINAL HYSTERECTOMY    . APPENDECTOMY    . BIOPSY  08/13/2020   Procedure: BIOPSY;  Surgeon: Rush Landmark Telford Nab., MD;  Location: [facility];  Service: Gastroenterology;;  . ESOPHAGOGASTRODUODENOSCOPY (EGD) WITH PROPOFOL N/A 08/13/2020   Procedure: ESOPHAGOGASTRODUODENOSCOPY (EGD) WITH PROPOFOL;  Surgeon: Irving Copas., MD;  Location: [facility];  Service: Gastroenterology;  Laterality: N/A;  . HEMOSTASIS CLIP PLACEMENT  08/13/2020   Procedure: HEMOSTASIS CLIP PLACEMENT;  Surgeon: Rush Landmark Telford Nab., MD;  Location: [facility];  Service:  Gastroenterology;;  . IR ANGIO VERTEBRAL SEL VERTEBRAL UNI L MOD SED  08/28/2020  . IR PERCUTANEOUS ART THROMBECTOMY/INFUSION INTRACRANIAL INC DIAG ANGIO  08/31/2020  . NM MYOVIEW LTD  [id number]   post stress left ventricle is normal in size, ejection fraction is 65%,  normal myocardial perfusion study, low risk scan  . PARTIAL HYSTERECTOMY    . RADIOLOGY WITH ANESTHESIA N/A 08/13/2020   Procedure: IR WITH ANESTHESIA - CODE STROKE;  Surgeon: Luanne Bras, MD;  Location: [facility];  Service: Radiology;  Laterality: N/A;  . TRANSTHORACIC ECHOCARDIOGRAM  [id number]   Social History   Social History Narrative   Has SCAT--pt states she is using her friend for transportation now      Lives alone at [place] in an apartment for seniors. She has an aid that comes 4 days a week for 2 hours.    Right handed   Immunization History  Administered Date(s) Administered  . Fluad Quad(high Dose 65+) 06/13/2020  . Influenza, High Dose Seasonal PF 06/25/2018, 06/03/2019  . PFIZER SARS-COV-2 Vaccination 11/24/2019, 12/15/2019  . Pneumococcal Conjugate-13 01/26/2015     Objective: Vital Signs: There were no vitals taken for this visit.   Physical Exam   Musculoskeletal Exam: ***  CDAI Exam: CDAI Score: -- Patient Global: --; Provider Global: -- Swollen: --; Tender: -- Joint Exam 09/07/2020   No joint exam has been documented for this visit   There is currently no information documented on the homunculus. Go to the Rheumatology activity and complete the homunculus joint exam.  Investigation: No additional findings.  Imaging: CT ANGIO HEAD W OR WO CONTRAST  Result Date: 08/19/2020 CLINICAL DATA:  Stroke, follow-up. Timeless stroke study protocol. Stroke suspected. EXAM: CT ANGIOGRAPHY HEAD AND NECK CT PERFUSION BRAIN TECHNIQUE: Multidetector CT imaging of the head and neck was performed using the standard protocol during bolus administration of intravenous contrast. Multiplanar CT image reconstructions and MIPs were obtained to evaluate the vascular anatomy. Carotid stenosis measurements (when applicable) are obtained utilizing NASCET criteria, using the distal internal carotid diameter as the denominator. Multiphase CT imaging of the brain was performed  following IV bolus contrast injection. Subsequent parametric perfusion maps were calculated using RAPID software. CONTRAST:  Administered contrast not known at this time. COMPARISON:  Brain MRI 08/19/2020. Noncontrast head CT, CT angiogram head/neck and CT perfusion 09/04/2020. FINDINGS: CT HEAD FINDINGS Brain: Mild generalized parenchymal atrophy. Loss of gray-white differentiation within portions of the left insula and along the posterior aspect of the left sylvian fissure. A known moderate-sized acute/early subacute left MCA territory infarct within the mid to posterior left frontal lobe and left frontoparietal operculum was otherwise better appreciated on the brain MRI performed earlier today. Numerous additional predominantly subcentimeter cortical and subcortical acute/early subacute infarcts within the bilateral cerebral and cerebellar hemispheres were also better appreciated on this prior exam. No evidence of hemorrhagic conversion.  No mass effect. Stable background chronic small vessel ischemic disease. Known chronic infarcts within the bilateral deep gray nuclei and cerebellar hemispheres. No extra-axial fluid collection. No midline shift. Vascular: Reported below. Skull: Normal. Negative for fracture or focal lesion. Sinuses/Orbits: Visualized orbits show no acute finding. Small volume frothy secretions within the left sphenoid sinus. Trace ethmoid and right sphenoid sinus mucosal thickening. CTA HEAD FINDINGS Anterior circulation: The intracranial internal carotid arteries are patent. Calcified plaque within both vessels with no more than mild stenosis. The M1 middle cerebral arteries are patent. Persistent occlusion of a superior division proximal M2 left MCA vessel (  series 14, image 21) (series 16, image 28). The A1 right anterior cerebral artery is markedly hypoplastic or absent on a developmental basis. The anterior cerebral arteries are otherwise patent. No intracranial aneurysm is identified.  Posterior circulation: The intracranial vertebral arteries are patent. Nonstenotic calcified plaque within the V4 left vertebral artery. The basilar artery is patent. The posterior cerebral arteries are patent. Posterior communicating arteries are present bilaterally. Venous sinuses: Within the limitations of contrast timing, no convincing thrombus. Anatomic variants: As described Review of the MIP images confirms the above findings CT Brain Perfusion Findings: CBF (<30%) Volume: 97mL Perfusion (Tmax>6.0s) volume: 57mL (left MCA vascular territory) Mismatch Volume: 40mL Infarction Location:None identified IMPRESSION: CT head: 1. Loss of gray-white differentiation within portions of the left insula and along the posterior aspect of the left sylvian fissure. A known moderate-sized acute/early subacute left MCA territory infarct was otherwise better appreciated on the brain MRI performed earlier today. 2. Numerous predominantly subcentimeter cortical and subcortical acute/early subacute infarcts within the bilateral cerebral and cerebellar hemispheres were also better appreciated on this prior exam. 3. No evidence of hemorrhagic conversion. 4. Stable background generalized parenchymal atrophy and chronic ischemic changes. CTA head: 1. Persistent occlusion of a superior division proximal M2 left MCA vessel. 2. Calcified plaque within the intracranial ICAs with no more than mild stenosis. CT perfusion head: The perfusion software identifies no core infarct. However, there are appreciable acute/early subacute left MCA territory infarction changes on the concurrently performed non-contrast head CT, as described. The perfusion software identifies a 14 mL region of hypoperfused parenchyma in the left MCA vascular territory utilizing the Tmax>6 seconds threshold. Reported mismatch volume: 14 mL. Electronically Signed   By: Kellie Simmering DO   On: 08/19/2020 13:39   DG Abd 1 View  Result Date: 08/20/2020 CLINICAL DATA:   Feeding tube placement. EXAM: ABDOMEN - 1 VIEW COMPARISON:  Chest radiograph 08/15/2020 FINDINGS: Imaging focused on the upper abdomen and lower chest. There is new volume loss in the left hemithorax with opacity at the left lung base and elevated hemidiaphragm. The enteric tube tip and side port are below the left hemidiaphragm in the left upper quadrant. Progressive hazy opacity at the right lung base. Patient is significantly rotated. IMPRESSION: 1. Enteric tube tip and side port below the left hemidiaphragm in the left upper quadrant. 2. New volume loss in the left hemithorax with opacity at the left lung base, this may be due to mucous plugging and/or aspiration. 3. Progressive hazy opacity at the right lung base, may represent atelectasis, airspace disease, pleural fluid or combination there of. Electronically Signed   By: Keith Rake M.D.   On: 08/20/2020 17:06   CT Head Wo Contrast  Result Date: 08/19/2020 CLINICAL DATA:  Stroke, follow-up. Timeless stroke study protocol. Stroke suspected. EXAM: CT ANGIOGRAPHY HEAD AND NECK CT PERFUSION BRAIN TECHNIQUE: Multidetector CT imaging of the head and neck was performed using the standard protocol during bolus administration of intravenous contrast. Multiplanar CT image reconstructions and MIPs were obtained to evaluate the vascular anatomy. Carotid stenosis measurements (when applicable) are obtained utilizing NASCET criteria, using the distal internal carotid diameter as the denominator. Multiphase CT imaging of the brain was performed following IV bolus contrast injection. Subsequent parametric perfusion maps were calculated using RAPID software. CONTRAST:  Administered contrast not known at this time. COMPARISON:  Brain MRI 08/19/2020. Noncontrast head CT, CT angiogram head/neck and CT perfusion 08/27/2020. FINDINGS: CT HEAD FINDINGS Brain: Mild generalized parenchymal atrophy. Loss of gray-white  differentiation within portions of the left insula and  along the posterior aspect of the left sylvian fissure. A known moderate-sized acute/early subacute left MCA territory infarct within the mid to posterior left frontal lobe and left frontoparietal operculum was otherwise better appreciated on the brain MRI performed earlier today. Numerous additional predominantly subcentimeter cortical and subcortical acute/early subacute infarcts within the bilateral cerebral and cerebellar hemispheres were also better appreciated on this prior exam. No evidence of hemorrhagic conversion.  No mass effect. Stable background chronic small vessel ischemic disease. Known chronic infarcts within the bilateral deep gray nuclei and cerebellar hemispheres. No extra-axial fluid collection. No midline shift. Vascular: Reported below. Skull: Normal. Negative for fracture or focal lesion. Sinuses/Orbits: Visualized orbits show no acute finding. Small volume frothy secretions within the left sphenoid sinus. Trace ethmoid and right sphenoid sinus mucosal thickening. CTA HEAD FINDINGS Anterior circulation: The intracranial internal carotid arteries are patent. Calcified plaque within both vessels with no more than mild stenosis. The M1 middle cerebral arteries are patent. Persistent occlusion of a superior division proximal M2 left MCA vessel (series 14, image 21) (series 16, image 28). The A1 right anterior cerebral artery is markedly hypoplastic or absent on a developmental basis. The anterior cerebral arteries are otherwise patent. No intracranial aneurysm is identified. Posterior circulation: The intracranial vertebral arteries are patent. Nonstenotic calcified plaque within the V4 left vertebral artery. The basilar artery is patent. The posterior cerebral arteries are patent. Posterior communicating arteries are present bilaterally. Venous sinuses: Within the limitations of contrast timing, no convincing thrombus. Anatomic variants: As described Review of the MIP images confirms the above  findings CT Brain Perfusion Findings: CBF (<30%) Volume: 32mL Perfusion (Tmax>6.0s) volume: 72mL (left MCA vascular territory) Mismatch Volume: 59mL Infarction Location:None identified IMPRESSION: CT head: 1. Loss of gray-white differentiation within portions of the left insula and along the posterior aspect of the left sylvian fissure. A known moderate-sized acute/early subacute left MCA territory infarct was otherwise better appreciated on the brain MRI performed earlier today. 2. Numerous predominantly subcentimeter cortical and subcortical acute/early subacute infarcts within the bilateral cerebral and cerebellar hemispheres were also better appreciated on this prior exam. 3. No evidence of hemorrhagic conversion. 4. Stable background generalized parenchymal atrophy and chronic ischemic changes. CTA head: 1. Persistent occlusion of a superior division proximal M2 left MCA vessel. 2. Calcified plaque within the intracranial ICAs with no more than mild stenosis. CT perfusion head: The perfusion software identifies no core infarct. However, there are appreciable acute/early subacute left MCA territory infarction changes on the concurrently performed non-contrast head CT, as described. The perfusion software identifies a 14 mL region of hypoperfused parenchyma in the left MCA vascular territory utilizing the Tmax>6 seconds threshold. Reported mismatch volume: 14 mL. Electronically Signed   By: Kellie Simmering DO   On: 08/19/2020 13:39   MR BRAIN WO CONTRAST  Result Date: 08/21/2020 CLINICAL DATA:  Follow-up examination for acute stroke. EXAM: MRI HEAD WITHOUT CONTRAST TECHNIQUE: Multiplanar, multiecho pulse sequences of the brain and surrounding structures were obtained without intravenous contrast. COMPARISON:  Prior CTs from 08/19/2020. FINDINGS: Brain: Moderately advanced cerebral atrophy and chronic microvascular ischemic disease again noted, stable. Scattered remote lacunar infarcts about the bilateral basal  ganglia/internal capsules again noted. Few additional remote lacunar infarcts about the midbrain, with additional scattered remote bilateral cerebellar infarcts again noted. Continued interval evolution of moderate-sized left MCA distribution infarct involving the mid and posterior left frontal region. Associated diffusion abnormality is somewhat increased and more confluent  in appearance as compared to previous, with slightly increased size. Evidence for faint petechial hemorrhage at the infarct bed without hemorrhagic transformation or significant regional mass effect (series 14, image 28). Multiple additional scattered predominantly cortical and subcortical ischemic infarcts again noted involving the bilateral cerebral hemispheres, as well as the right greater than left cerebellum involvement of the pre and postcentral gyri again noted bilaterally. Overall number and distribution of these additional infarcts is not felt to be significantly changed or progressed. Possible single focus of associated petechial hemorrhage at the left parietal lobe (series 14, image 33). No other evidence for associated hemorrhage or significant mass effect. Few additional scattered chronic micro hemorrhages noted within the brain and cerebellum, otherwise stable. No mass lesion, midline shift or mass effect. Ventricular size is stable without hydrocephalus. No extra-axial fluid collection. Pituitary gland suprasellar region normal. Midline structures intact. Vascular: Focal T1 hyperintensity and susceptibility artifact seen involving a proximal left M2 branch, consistent with major intracranial vascular flow voids are otherwise maintained. Thrombus (series 14, image 23). Skull and upper cervical spine: Craniocervical junction within normal limits. Bone marrow signal intensity within normal limits. Interval evolution of previously identified right frontal scalp contusion, decreased in prominence from previous. Sinuses/Orbits: Sequelae  of prior bilateral ocular lens replacement. Globes and orbital soft tissues demonstrate no acute finding. Chronic mucosal thickening noted within the sphenoethmoidal sinuses. Paranasal sinuses are otherwise clear. Mild to moderate bilateral mastoid effusions, slightly increased in prominence from previous. Inner ear structures within normal limits. Other: None. IMPRESSION: 1. Continued interval evolution of moderate-sized early subacute left MCA distribution infarct involving the mid and posterior left frontal region. Evidence for faint petechial hemorrhage at the infarct bed without hemorrhagic transformation or significant regional mass effect. 2. Multiple additional scattered predominantly cortical and subcortical early subacute ischemic infarcts involving the bilateral cerebral hemispheres and cerebellum, not significantly changed or progressed from previous. 3. Focal T1 hyperintensity and susceptibility artifact involving a proximal left M2 branch, consistent with previously identified intraluminal thrombus. 4. Underlying moderately advanced cerebral atrophy with chronic microvascular ischemic disease with remote ischemic changes as above, stable. Electronically Signed   By: Jeannine Boga M.D.   On: 08/21/2020 21:29   MR BRAIN WO CONTRAST  Result Date: 08/19/2020 CLINICAL DATA:  Follow-up examination for acute stroke, right-sided weakness and aphasia. EXAM: MRI HEAD WITHOUT CONTRAST TECHNIQUE: Multiplanar, multiecho pulse sequences of the brain and surrounding structures were obtained without intravenous contrast. COMPARISON:  Prior CTs from 08/10/2020. FINDINGS: Brain: Examination degraded by motion artifact. Diffuse prominence of the CSF containing spaces compatible with generalized cerebral atrophy. Patchy and confluent T2/FLAIR hyperintensity within the periventricular and deep white matter both cerebral hemispheres consistent with chronic small vessel ischemic disease, moderate in nature.  Scattered remote lacunar infarcts noted about the bilateral basal ganglia/internal capsules. Few scattered remote lacunar infarcts about the midbrain, with additional scattered remote bilateral cerebellar infarcts. Confluent area of restricted diffusion involving the mid-posterior left frontal region consistent with acute left MCA territory infarct, corresponding with perfusion deficit seen on prior CT perfusion. Suspected associated petechial hemorrhage without frank hemorrhagic transformation, although evaluation somewhat limited due to extensive SWI signal throughout the intracranial vasculature, suspected be related to iron administration. No significant regional mass effect. Multiple additional scattered cortical and subcortical ischemic infarcts seen involving the bilateral cerebral hemispheres, with involvement of the frontal, parietal, and occipital lobes, left slightly worse than right. These are predominantly subcentimeter in nature. Patchy involvement of both pre and postcentral gyri noted. Additional scattered subcentimeter  ischemic infarcts noted involving the right greater than left cerebellar hemispheres. No associated hemorrhage or mass effect about these additional infarcts. No mass lesion, midline shift or mass effect. No hydrocephalus or extra-axial fluid collection. Pituitary gland and suprasellar region within normal limits. Midline structures intact. Few additional small chronic micro hemorrhages noted at the left centrum semi ovale, left basal ganglia, and left cerebellum. Vascular: Major intracranial vascular flow voids are maintained at the skull base. Diffuse SWI signal with T1 hyperintensity throughout the intracranial vasculature, likely related to iron administration. Skull and upper cervical spine: Craniocervical junction normal. Bone marrow signal intensity normal. There is an apparent evolving right frontal scalp contusion (series 5, image 85). Sinuses/Orbits: Globes and orbital soft  tissues demonstrate no acute finding. Patient status post bilateral ocular lens replacement. Mild sphenoid sinus disease. Small bilateral mastoid effusions noted, of doubtful significance. Inner ear structures grossly normal. Other: None. IMPRESSION: 1. Moderate-sized acute ischemic nonhemorrhagic left MCA territory infarct. Suspected associated petechial hemorrhage without frank hemorrhagic transformation or significant regional mass effect. Size and distribution closely matches previously seen perfusion deficit. 2. Multiple additional scattered predominantly subcentimeter cortical and subcortical ischemic infarcts involving the bilateral cerebral and cerebellar hemispheres as above. No associated hemorrhage or mass effect. 3. Underlying age-related cerebral atrophy with moderate chronic small vessel ischemic disease, with multiple remote lacunar infarcts about the deep gray nuclei, midbrain, and cerebellum. 4. Apparent evolving right frontal scalp contusion. Electronically Signed   By: Jeannine Boga M.D.   On: 08/19/2020 03:10   US RENAL  Addendum Date: 08/21/2020   ADDENDUM REPORT: 08/21/2020 15:14 IMPRESSION: Echogenic focus in the right kidney is nonspecific but could represent a small nonobstructing renal stone. Electronically Signed   By: Audie Pinto M.D.   On: 08/21/2020 15:14   Result Date: 08/21/2020 CLINICAL DATA:  Acute kidney injury EXAM: RENAL / URINARY TRACT ULTRASOUND COMPLETE COMPARISON:  Renal ultrasound 08/10/2020 FINDINGS: Right Kidney: Renal measurements: 10.0 x 5.1 x 5.5 cm = volume: 146 mL. Mild increased echogenicity. There is an echogenic focus in the superior aspect of the kidney measuring 0.4 cm which could represent a small calculus. There is a 1.0 cm cyst. No hydronephrosis. Left Kidney: Renal measurements: 11.1 x 5.6 x 6.6 cm = volume: 212 mL. Mildly increased echogenicity. No hydronephrosis. There are 2 cysts measuring 2.3 cm and 1.0 cm. Bladder: Poorly visualized,  possibly decompressed. Other: None. IMPRESSION: No acute finding in the bilateral kidneys. Increased echogenicity bilaterally as can be seen in medical renal disease. Electronically Signed: By: Audie Pinto M.D. On: 08/21/2020 15:08   US RENAL  Result Date: 08/10/2020 CLINICAL DATA:  Inpatient.  Acute kidney injury. EXAM: RENAL / URINARY TRACT ULTRASOUND COMPLETE COMPARISON:  04/25/2020 renal sonogram. FINDINGS: Right Kidney: Renal measurements: 9.5 x 4.9 x 5.4 cm = volume: 132 mL. No hydronephrosis. Mildly echogenic right renal parenchyma, mildly atrophic (9 mm thickness). Simple 1.1 x 0.8 x 0.8 cm renal cyst in lower right kidney. Left Kidney: Renal measurements: 10.4 x 5.8 x 5.1 cm = volume: 160 mL. No hydronephrosis. Echogenic left renal parenchymar, mildly atrophic (9 mm thickness). Simple 1.1 x 0.8 x 0.9 cm lower left renal cyst. Bladder: Not visualized. Other: None. IMPRESSION: 1. No hydronephrosis. 2. Mildly atrophic and echogenic kidneys bilaterally, compatible with provided history of nonspecific acute renal parenchymal disease, probably acute on chronic. 3. Small simple renal cysts bilaterally. Electronically Signed   By: Ilona Sorrel M.D.   On: 08/10/2020 16:25   CT CEREBRAL PERFUSION W  CONTRAST  Result Date: 08/19/2020 CLINICAL DATA:  Stroke, follow-up. Timeless stroke study protocol. Stroke suspected. EXAM: CT ANGIOGRAPHY HEAD AND NECK CT PERFUSION BRAIN TECHNIQUE: Multidetector CT imaging of the head and neck was performed using the standard protocol during bolus administration of intravenous contrast. Multiplanar CT image reconstructions and MIPs were obtained to evaluate the vascular anatomy. Carotid stenosis measurements (when applicable) are obtained utilizing NASCET criteria, using the distal internal carotid diameter as the denominator. Multiphase CT imaging of the brain was performed following IV bolus contrast injection. Subsequent parametric perfusion maps were calculated using  RAPID software. CONTRAST:  Administered contrast not known at this time. COMPARISON:  Brain MRI 08/19/2020. Noncontrast head CT, CT angiogram head/neck and CT perfusion 08/21/2020. FINDINGS: CT HEAD FINDINGS Brain: Mild generalized parenchymal atrophy. Loss of gray-white differentiation within portions of the left insula and along the posterior aspect of the left sylvian fissure. A known moderate-sized acute/early subacute left MCA territory infarct within the mid to posterior left frontal lobe and left frontoparietal operculum was otherwise better appreciated on the brain MRI performed earlier today. Numerous additional predominantly subcentimeter cortical and subcortical acute/early subacute infarcts within the bilateral cerebral and cerebellar hemispheres were also better appreciated on this prior exam. No evidence of hemorrhagic conversion.  No mass effect. Stable background chronic small vessel ischemic disease. Known chronic infarcts within the bilateral deep gray nuclei and cerebellar hemispheres. No extra-axial fluid collection. No midline shift. Vascular: Reported below. Skull: Normal. Negative for fracture or focal lesion. Sinuses/Orbits: Visualized orbits show no acute finding. Small volume frothy secretions within the left sphenoid sinus. Trace ethmoid and right sphenoid sinus mucosal thickening. CTA HEAD FINDINGS Anterior circulation: The intracranial internal carotid arteries are patent. Calcified plaque within both vessels with no more than mild stenosis. The M1 middle cerebral arteries are patent. Persistent occlusion of a superior division proximal M2 left MCA vessel (series 14, image 21) (series 16, image 28). The A1 right anterior cerebral artery is markedly hypoplastic or absent on a developmental basis. The anterior cerebral arteries are otherwise patent. No intracranial aneurysm is identified. Posterior circulation: The intracranial vertebral arteries are patent. Nonstenotic calcified plaque  within the V4 left vertebral artery. The basilar artery is patent. The posterior cerebral arteries are patent. Posterior communicating arteries are present bilaterally. Venous sinuses: Within the limitations of contrast timing, no convincing thrombus. Anatomic variants: As described Review of the MIP images confirms the above findings CT Brain Perfusion Findings: CBF (<30%) Volume: 61mL Perfusion (Tmax>6.0s) volume: 23mL (left MCA vascular territory) Mismatch Volume: 4mL Infarction Location:None identified IMPRESSION: CT head: 1. Loss of gray-white differentiation within portions of the left insula and along the posterior aspect of the left sylvian fissure. A known moderate-sized acute/early subacute left MCA territory infarct was otherwise better appreciated on the brain MRI performed earlier today. 2. Numerous predominantly subcentimeter cortical and subcortical acute/early subacute infarcts within the bilateral cerebral and cerebellar hemispheres were also better appreciated on this prior exam. 3. No evidence of hemorrhagic conversion. 4. Stable background generalized parenchymal atrophy and chronic ischemic changes. CTA head: 1. Persistent occlusion of a superior division proximal M2 left MCA vessel. 2. Calcified plaque within the intracranial ICAs with no more than mild stenosis. CT perfusion head: The perfusion software identifies no core infarct. However, there are appreciable acute/early subacute left MCA territory infarction changes on the concurrently performed non-contrast head CT, as described. The perfusion software identifies a 14 mL region of hypoperfused parenchyma in the left MCA vascular territory utilizing the Tmax>6  seconds threshold. Reported mismatch volume: 14 mL. Electronically Signed   By: Kellie Simmering DO   On: 08/19/2020 13:39   CT CEREBRAL PERFUSION W CONTRAST  Result Date: 08/13/2020 CLINICAL DATA:  Neuro deficit, acute, stroke suspected. Additional history provided: Right-sided  facial droop, slurred speech. EXAM: CT ANGIOGRAPHY HEAD AND NECK CT PERFUSION BRAIN TECHNIQUE: Multidetector CT imaging of the head and neck was performed using the standard protocol during bolus administration of intravenous contrast. Multiplanar CT image reconstructions and MIPs were obtained to evaluate the vascular anatomy. Carotid stenosis measurements (when applicable) are obtained utilizing NASCET criteria, using the distal internal carotid diameter as the denominator. Multiphase CT imaging of the brain was performed following IV bolus contrast injection. Subsequent parametric perfusion maps were calculated using RAPID software. CONTRAST:  64mL OMNIPAQUE IOHEXOL 350 MG/ML SOLN COMPARISON:  Noncontrast head CT performed earlier today 09/05/2020. FINDINGS: CTA NECK FINDINGS Aortic arch: The left vertebral artery arises directly from the aortic arch. Atherosclerotic plaque within the visualized aortic arch and proximal major branch vessels of the neck. No hemodynamically significant innominate or proximal subclavian artery stenosis. Right carotid system: CCA and ICA patent within the neck without stenosis. Mild calcified plaque within the carotid bifurcation and proximal ICA. Left carotid system: CCA and ICA patent within the neck without significant stenosis (50% or greater). Moderate calcified plaque within the carotid bifurcation and proximal ICA. Vertebral arteries: Vertebral arteries codominant and patent within the neck without stenosis. Nonstenotic calcified plaque at the origin of the left vertebral artery. Skeleton: No acute bony abnormality or aggressive osseous lesion. Cervical spondylosis with multilevel disc space narrowing, disc bulges, posterior disc osteophytes, uncovertebral hypertrophy and facet arthrosis. Suspected degenerative fusion across the disc spaces at C4-C5 and C6-C7. Grade 1 anterolisthesis at C3-C4, C4-C5, C5-C6. Other neck: No pathologically enlarged cervical chain lymph nodes.  Multiple thyroid nodules, the largest within the left lobe measuring 2.3 cm (series 7, image 110). Upper chest: No consolidation within the imaged lung apices. Review of the MIP images confirms the above findings CTA HEAD FINDINGS Anterior circulation: The intracranial internal carotid arteries are patent. Calcified plaque within both vessels with no more than mild stenosis. The M1 middle cerebral arteries are patent. Occlusion of a superior division proximal M2 left MCA vessel (series 11, image 23) (series 9, image 45). The A1 right anterior cerebral artery is markedly hypoplastic or absent on a developmental basis. The anterior cerebral arteries are otherwise patent. No intracranial aneurysm is identified. Posterior circulation: The intracranial vertebral arteries are patent. Nonstenotic calcified plaque within the left V4 segment. The basilar artery is patent. The posterior cerebral arteries are patent. Posterior communicating arteries are present bilaterally. Venous sinuses: Within the limitations of contrast timing, no convincing thrombus. Anatomic variants: As described Review of the MIP images confirms the above findings CT Brain Perfusion Findings: The perfusion software detects suboptimal contrast bolus timing and/or excessive motion, limiting the reliability of the perfusion data. CBF (<30%) Volume: 64mL Perfusion (Tmax>6.0s) volume: 62mL (predominantly within the left MCA vascular territory). Mismatch Volume: 27mL Infarction Location:None identified Emergent findings were called by telephone at the time of interpretation on 08/20/2020 at 1:54 pm to provider Dr. Gilford Raid, who verbally acknowledged these results. IMPRESSION: CTA neck: 1. The common carotid, internal carotid and vertebral arteries are patent within the neck without hemodynamically significant stenosis. Atherosclerotic disease within these vessels as described. Most notably, there is moderate calcified plaque within the left carotid bifurcation  and proximal ICA. 2. Multiple thyroid nodules, the largest within  the left lobe measuring 2.3 cm. Nonemergent thyroid ultrasound is recommended for further evaluation. CTA head: 1. Occlusion of a superior division proximal M2 left MCA vessel. Emergent neuro-interventional consultation is recommended. 2. Calcified plaque within the intracranial ICAs with no more than mild resultant stenosis. CT perfusion head: 1. The perfusion software detects suboptimal contrast bolus timing and/or excessive motion, limiting the reliability of the perfusion data. 2. The perfusion software identifies a 19 mL region of hypoperfused parenchyma predominantly within the left MCA vascular territory. No core infarct is detected. Electronically Signed   By: Kellie Simmering DO   On: 09/02/2020 14:15   DG Chest Port 1 View  Result Date: 08/10/2020 CLINICAL DATA:  Stroke EXAM: PORTABLE CHEST 1 VIEW COMPARISON:  08/10/2020 FINDINGS: The patient is rotated to the right on today's radiograph, reducing diagnostic sensitivity and specificity. Atherosclerotic calcification of the aortic arch. Heart size within normal limits. The lungs appear clear. No blunting of the costophrenic angles. Bony demineralization is present. IMPRESSION: 1. No acute findings. 2.  Aortic Atherosclerosis (ICD10-I70.0). Electronically Signed   By: Van Clines M.D.   On: 08/22/2020 19:10   Portable Chest 1 View  Result Date: 08/10/2020 CLINICAL DATA:  86 yo F with a chief complaints of a syncopal event. Patient states that she was making breakfast and her home health aide who is over she suddenly felt bad like she may pass out and the nurses aide made her sit down in her rolling chair and she passed out. She denied any chest pain or pressure. Denied headache or neck pain. Denies cough congestion or fever denies nausea vomiting or diarrhea EXAM: PORTABLE CHEST 1 VIEW COMPARISON:  11/07/2010 FINDINGS: Cardiac silhouette is normal in size. No mediastinal or hilar  masses. Clear lungs.  No convincing pleural effusion and no pneumothorax. Skeletal structures are grossly intact. IMPRESSION: No active disease. Electronically Signed   By: Lajean Manes M.D.   On: 08/10/2020 15:33   DG Swallowing Func-Speech Pathology  Result Date: 08/22/2020 Objective Swallowing Evaluation: Type of Study: MBS-Modified Barium Swallow Study  Patient Details Name: Maria [name] MRN: [id number] Date of Birth: 1926/08/18 Today's Date: 08/22/2020 Time: SLP Start Time (ACUTE ONLY): 1345 -SLP Stop Time (ACUTE ONLY): 6789 SLP Time Calculation (min) (ACUTE ONLY): 20 min Past Medical History: Past Medical History: Diagnosis Date . Anxiety  . Arthritis  . Blood transfusion  . Depression  . Diabetes mellitus  . Edema  . Heart murmur  . Hyperlipidemia  . Hypertension  . Sleep apnea   cpap . Stroke ([place])   3 x tias . TIA (transient ischemic attack)  Past Surgical History: Past Surgical History: Procedure Laterality Date . ABDOMINAL HYSTERECTOMY   . APPENDECTOMY   . BIOPSY  08/13/2020  Procedure: BIOPSY;  Surgeon: Rush Landmark Telford Nab., MD;  Location: [facility];  Service: Gastroenterology;; . ESOPHAGOGASTRODUODENOSCOPY (EGD) WITH PROPOFOL N/A 08/13/2020  Procedure: ESOPHAGOGASTRODUODENOSCOPY (EGD) WITH PROPOFOL;  Surgeon: Irving Copas., MD;  Location: [facility];  Service: Gastroenterology;  Laterality: N/A; . HEMOSTASIS CLIP PLACEMENT  08/13/2020  Procedure: HEMOSTASIS CLIP PLACEMENT;  Surgeon: Irving Copas., MD;  Location: [facility];  Service: Gastroenterology;; . IR PERCUTANEOUS ART THROMBECTOMY/INFUSION INTRACRANIAL INC DIAG ANGIO  09/08/2020 . NM MYOVIEW LTD  [id number]  post stress left ventricle is normal in size, ejection fraction is 65%, normal myocardial perfusion study, low risk scan . PARTIAL HYSTERECTOMY   . RADIOLOGY WITH ANESTHESIA N/A 08/22/2020  Procedure: IR WITH ANESTHESIA - CODE STROKE;  Surgeon: Luanne Bras,  MD;  Location: [facility];  Service: Radiology;   Laterality: N/A; . TRANSTHORACIC ECHOCARDIOGRAM  [id number] HPI: KARLYE IHRIG is a 86 y.o. with a history of previous stroke, hypertension, DM, hyperlipidemia who was recently admitted after syncopal episode and found to have anemia with gastritis and erosions (started on PPI).  Developed with right-sided weakness and aphasia. Found to have cerebral infarction due to occlusion or stenosis of left middle cerebral artery. CXR clear. No prior ST notes. She had neuro change on 12/11 with MRI pending  No data recorded Assessment / Plan / Recommendation CHL IP CLINICAL IMPRESSIONS 08/22/2020 Clinical Impression Oropharyngeal dysphagia is mild-moderate and marked by oral transit delays, residue, reduced propulsion and mistimed swallow onset. Combination of reduced lingual ROM/sensation led to significantly delayed transit to pharynx up to 3 minutes. Excessive right bucal cavity retention and anterior spillage without awareness. All trials reached her valleculae and sat for greater than 8-10 seconds before swallow initiated. Nectar thick was penetrated to her vocal cords, without response and almost exited vestibule but slight remained at petiole of epiglottis. Mild-mod vallecular and pyriform sinus residue with honey thick without penetration/aspiration. Given delays in oral and pharyngeal transit she would likely be unable to maintain nurtitional needs. Recommend continue NPO and therapist willl continue intervention. SLP Visit Diagnosis Dysphagia, oropharyngeal phase (R13.12) Attention and concentration deficit following -- Frontal lobe and executive function deficit following -- Impact on safety and function Moderate aspiration risk;Severe aspiration risk   CHL IP TREATMENT RECOMMENDATION 08/22/2020 Treatment Recommendations Therapy as outlined in treatment plan below   Prognosis 08/22/2020 Prognosis for Safe Diet Advancement (No Data) Barriers to Reach Goals Severity of deficits Barriers/Prognosis Comment -- CHL IP  DIET RECOMMENDATION 08/22/2020 SLP Diet Recommendations NPO Liquid Administration via -- Medication Administration Via alternative means Compensations -- Postural Changes --   CHL IP OTHER RECOMMENDATIONS 08/22/2020 Recommended Consults -- Oral Care Recommendations Oral care QID Other Recommendations --   CHL IP FOLLOW UP RECOMMENDATIONS 08/22/2020 Follow up Recommendations Skilled Nursing facility   [facility] IP FREQUENCY AND DURATION 08/22/2020 Speech Therapy Frequency (ACUTE ONLY) min 2x/week Treatment Duration 2 weeks      CHL IP ORAL PHASE 08/22/2020 Oral Phase Impaired Oral - Pudding Teaspoon -- Oral - Pudding Cup -- Oral - Honey Teaspoon Decreased bolus cohesion;Delayed oral transit;Lingual/palatal residue;Right pocketing in lateral sulci;Right anterior bolus loss Oral - Honey Cup Decreased bolus cohesion;Delayed oral transit;Lingual/palatal residue;Right pocketing in lateral sulci;Right anterior bolus loss Oral - Nectar Teaspoon Decreased bolus cohesion;Delayed oral transit;Lingual/palatal residue;Right pocketing in lateral sulci;Right anterior bolus loss Oral - Nectar Cup NT Oral - Nectar Straw -- Oral - Thin Teaspoon -- Oral - Thin Cup -- Oral - Thin Straw -- Oral - Puree Decreased bolus cohesion;Delayed oral transit;Lingual/palatal residue;Right pocketing in lateral sulci Oral - Mech Soft -- Oral - Regular -- Oral - Multi-Consistency -- Oral - Pill -- Oral Phase - Comment --  CHL IP PHARYNGEAL PHASE 08/22/2020 Pharyngeal Phase Impaired Pharyngeal- Pudding Teaspoon -- Pharyngeal -- Pharyngeal- Pudding Cup -- Pharyngeal -- Pharyngeal- Honey Teaspoon Delayed swallow initiation-vallecula;Pharyngeal residue - valleculae;Pharyngeal residue - pyriform Pharyngeal -- Pharyngeal- Honey Cup Delayed swallow initiation-vallecula;Pharyngeal residue - valleculae;Pharyngeal residue - pyriform Pharyngeal -- Pharyngeal- Nectar Teaspoon Penetration/Aspiration during swallow;Delayed swallow initiation-vallecula;Pharyngeal residue  - valleculae Pharyngeal Material enters airway, CONTACTS cords and not ejected out Pharyngeal- Nectar Cup NT Pharyngeal -- Pharyngeal- Nectar Straw -- Pharyngeal -- Pharyngeal- Thin Teaspoon -- Pharyngeal -- Pharyngeal- Thin Cup -- Pharyngeal -- Pharyngeal- Thin Straw -- Pharyngeal -- Pharyngeal-  Puree Delayed swallow initiation-vallecula;Pharyngeal residue - valleculae Pharyngeal -- Pharyngeal- Mechanical Soft -- Pharyngeal -- Pharyngeal- Regular -- Pharyngeal -- Pharyngeal- Multi-consistency -- Pharyngeal -- Pharyngeal- Pill -- Pharyngeal -- Pharyngeal Comment --  CHL IP CERVICAL ESOPHAGEAL PHASE 08/22/2020 Cervical Esophageal Phase WFL Pudding Teaspoon -- Pudding Cup -- Honey Teaspoon -- Honey Cup -- Nectar Teaspoon -- Nectar Cup -- Nectar Straw -- Thin Teaspoon -- Thin Cup -- Thin Straw -- Puree -- Mechanical Soft -- Regular -- Multi-consistency -- Pill -- Cervical Esophageal Comment -- [name] 08/22/2020, 2:58 PM [name] Litaker M.Ed Actor Pager [phone] Office [phone]              ECHOCARDIOGRAM COMPLETE  Result Date: 08/11/2020    ECHOCARDIOGRAM REPORT   Patient Name:   Maria [name] Boldman Date of Exam: 08/11/2020 Medical Rec #:  [id number]        Height:       59.0 in Accession #:    [id number]       Weight:       149.5 lb Date of Birth:  19-Jan-1926         BSA:          1.630 m Patient Age:    19 years         BP:           122/59 mmHg Patient Gender: F                HR:           86 bpm. Exam Location:  Inpatient Procedure: 2D Echo, Cardiac Doppler and Color Doppler Indications:    R55 Syncope  History:        Patient has prior history of Echocardiogram examinations, most                 recent 04/20/2019. Abnormal ECG, TIA, Signs/Symptoms:Dyspnea and                 Shortness of Breath; Risk Factors:Dyslipidemia, Hypertension and                 Diabetes.  Sonographer:    Roseanna Rainbow RDCS Referring Phys: [id number] Jonnie Finner  Sonographer Comments: Technically  difficult study due to poor echo windows. IMPRESSIONS  1. Hyperdynamic LV function; proximal septal thickening with systolic anterior motion of MV; LVOT velocity of 2.5 m/s that increased to 3.5 m/s; findings c/w HOCM.  2. Left ventricular ejection fraction, by estimation, is >75%. The left ventricle has hyperdynamic function. The left ventricle has no regional wall motion abnormalities. There is mild left ventricular hypertrophy. Left ventricular diastolic parameters are consistent with Grade I diastolic dysfunction (impaired relaxation).  3. Right ventricular systolic function is normal. The right ventricular size is normal.  4. The mitral valve is normal in structure. No evidence of mitral valve regurgitation. No evidence of mitral stenosis.  5. The aortic valve has an indeterminant number of cusps. Aortic valve regurgitation is not visualized. No aortic stenosis is present.  6. The inferior vena cava is normal in size with greater than 50% respiratory variability, suggesting right atrial pressure of 3 mmHg. FINDINGS  Left Ventricle: Left ventricular ejection fraction, by estimation, is >75%. The left ventricle has hyperdynamic function. The left ventricle has no regional wall motion abnormalities. The left ventricular internal cavity size was normal in size. There is mild left ventricular hypertrophy. Left ventricular diastolic parameters are consistent with Grade I diastolic dysfunction (impaired relaxation).  Right Ventricle: The right ventricular size is normal.Right ventricular systolic function is normal. Left Atrium: Left atrial size was normal in size. Right Atrium: Right atrial size was normal in size. Pericardium: There is no evidence of pericardial effusion. Mitral Valve: The mitral valve is normal in structure. No evidence of mitral valve regurgitation. No evidence of mitral valve stenosis. MV peak gradient, 7.8 mmHg. The mean mitral valve gradient is 2.0 mmHg. Tricuspid Valve: The tricuspid valve is  normal in structure. Tricuspid valve regurgitation is trivial. No evidence of tricuspid stenosis. Aortic Valve: The aortic valve has an indeterminant number of cusps. Aortic valve regurgitation is not visualized. No aortic stenosis is present. Aortic valve mean gradient measures 13.0 mmHg. Aortic valve peak gradient measures 22.1 mmHg. Aortic valve area, by VTI measures 1.76 cm. Pulmonic Valve: The pulmonic valve was not well visualized. Pulmonic valve regurgitation is not visualized. No evidence of pulmonic stenosis. Aorta: The aortic root is normal in size and structure. Venous: The inferior vena cava is normal in size with greater than 50% respiratory variability, suggesting right atrial pressure of 3 mmHg.  Additional Comments: Hyperdynamic LV function; proximal septal thickening with systolic anterior motion of MV; LVOT velocity of 2.5 m/s that increased to 3.5 m/s; findings c/w HOCM.  LEFT VENTRICLE PLAX 2D LVIDd:         2.93 cm     Diastology LVIDs:         1.84 cm     LV e' medial:    8.43 cm/s LV PW:         1.34 cm     LV E/e' medial:  10.1 LV IVS:        1.19 cm     LV e' lateral:   8.05 cm/s LVOT diam:     1.75 cm     LV E/e' lateral: 10.6 LV SV:         77 LV SV Index:   47 LVOT Area:     2.41 cm  LV Volumes (MOD) LV vol d, MOD A2C: 49.6 ml LV vol d, MOD A4C: 44.6 ml LV vol s, MOD A2C: 16.8 ml LV vol s, MOD A4C: 10.2 ml LV SV MOD A2C:     32.8 ml LV SV MOD A4C:     44.6 ml LV SV MOD BP:      36.6 ml RIGHT VENTRICLE             IVC RV S prime:     12.70 cm/s  IVC diam: 1.36 cm TAPSE (M-mode): 1.6 cm LEFT ATRIUM             Index       RIGHT ATRIUM           Index LA diam:        3.10 cm 1.90 cm/m  RA Area:     13.20 cm LA Vol (A2C):   36.1 ml 22.15 ml/m RA Volume:   28.80 ml  17.67 ml/m LA Vol (A4C):   31.4 ml 19.27 ml/m LA Biplane Vol: 35.0 ml 21.48 ml/m  AORTIC VALVE AV Area (Vmax):    1.86 cm AV Area (Vmean):   1.85 cm AV Area (VTI):     1.76 cm AV Vmax:           235.00 cm/s AV Vmean:           169.000 cm/s AV VTI:            0.439  m AV Peak Grad:      22.1 mmHg AV Mean Grad:      13.0 mmHg LVOT Vmax:         182.00 cm/s LVOT Vmean:        130.000 cm/s LVOT VTI:          0.321 m LVOT/AV VTI ratio: 0.73  AORTA Ao Root diam: 3.00 cm MITRAL VALVE MV Area (PHT): 4.68 cm     SHUNTS MV Peak grad:  7.8 mmHg     Systemic VTI:  0.32 m MV Mean grad:  2.0 mmHg     Systemic Diam: 1.75 cm MV Vmax:       1.40 m/s MV Vmean:      62.8 cm/s MV Decel Time: 162 msec MV E velocity: 85.30 cm/s MV A velocity: 127.00 cm/s MV E/A ratio:  0.67 Kirk Ruths MD Electronically signed by Kirk Ruths MD Signature Date/Time: 08/11/2020/1:08:20 PM    Final    IR PERCUTANEOUS ART THROMBECTOMY/INFUSION INTRACRANIAL INC DIAG ANGIO  Result Date: 08/21/2020 INDICATION: New onset right-sided weakness with global aphasia. Occluded superior division of the left middle cerebral artery M2 segment. EXAM: 1. EMERGENT LARGE VESSEL OCCLUSION THROMBOLYSIS (anterior CIRCULATION) COMPARISON:  CT angio the head and neck of August 18, 2020. MEDICATIONS: Ancef 2 g IV antibiotic was administered within 1 hour of the procedure. ANESTHESIA/SEDATION: General anesthesia CONTRAST:  Isovue 300 approximately 125 mL FLUOROSCOPY TIME:  Fluoroscopy Time: 62 minutes 30 seconds (789 mGy). COMPLICATIONS: None immediate. TECHNIQUE: Following a full explanation of the procedure along with the potential associated complications, an informed witnessed consent was obtained. The risks of intracranial hemorrhage of 10%, worsening neurological deficit, ventilator dependency, death and inability to revascularize were all reviewed in detail with the patient's daughter The patient was then put under general anesthesia by the Department of Anesthesiology at [facility]. The right groin was prepped and draped in the usual sterile fashion. Thereafter using modified Seldinger technique, transfemoral access into the right common femoral artery was obtained  without difficulty. Over a 0.035 inch guidewire an 8 French 25 cm Pinnacle sheath was inserted. Through this, and also over a 0.035 inch guidewire a [place] JB 1 catheter was advanced to the aortic arch region and selectively positioned in the left common carotid artery and the left vertebral artery. FINDINGS: The left common carotid arteriogram demonstrates a severe tortuosity in its proximal 1/3. More distally the left external carotid artery and its branches opacify adequately. The left internal carotid artery at the bulb to the cranial skull base opacifies widely. The petrous, the cavernous and the supraclinoid segments are widely patent. The left posterior communicating artery is seen opacifying the left posterior cerebral artery distribution transiently. The left middle cerebral artery opacifies proximally in the M1 segment and also the inferior division. Occlusion of the superior division in the proximal M2 segment is noted. The left anterior cerebral artery opacifies into the capillary and venous phases. The delayed arterial phase demonstrates retrograde opacification of the left middle cerebral artery superior division territory from the distal collaterals arising from the pericallosal and the callosal marginal branches of the left anterior cerebral artery. Prompt opacification via the anterior communicating artery of the right anterior cerebral A2 segment and distally is noted. The left vertebral artery origin is widely patent. Moderate tortuosity noted of the proximal left vertebral artery just distal to its origin. More distally the vessel opacifies to the cranial skull base. The left vertebrobasilar junction, the left posterior-inferior cerebellar  artery, and the opacified basilar artery, the left posterior cerebral artery and the superior cerebellar artery demonstrate patency into the delayed venous phase. Unopacified blood is noted in the basilar artery from the contralateral vertebral artery.  PROCEDURE: Over a 0.035 inch Roadrunner guidewire, and then an 035 inch regular Glidewire, and an 035 inch Rosen exchange guidewire multiple attempts were made to advance initially a JB 1 catheter, and then a select Penumbra Simmons 2 catheter, a regular Simmons 2 catheter, a Simmons 3 diagnostic catheter, and finally a Simmons 2 6 French 90 cm Envoy guide catheter. Through each of these, initially an 027 160 cm Marksman microcatheter, duo 156 cm Headway microcatheter, an 035 136 cm catheter were advanced over multiple 014, and 016 micro guidewires to gain distal access to the left middle cerebral artery without success. This was primarily due to the severe tortuosity in the proximal left common carotid artery, in addition to severely distorted anatomy of the brachiocephalic origins of the aortic arch region. Following multiple failed attempts at gaining access more distally, the procedure was stopped. A final control arteriogram performed through the proximal left common carotid artery continued to demonstrate occlusion of the superior division M2 segment of the left middle cerebral artery. The 8 French Pinnacle sheath was removed and manual compression was applied for hemostasis over about 30 minutes. The right groin appeared soft. Distal pulses continued to be Dopplerable in both feet unchanged. The patient's general anesthesia was then reversed. The patient was able to breathe on her own. However, she remained paralyzed in the right upper and lower extremity, with expressive aphasia. Patient was then transferred to the PACU and then the neuro ICU for post attempted revascularization. IMPRESSION: Failed endovascular revascularization of the left middle cerebral artery superior division M2 segment as described above. PLAN: As per referring MD. Electronically Signed   By: Luanne Bras M.D.   On: 08/19/2020 10:49   XR Knee Complete 4 Views Right  Result Date: 08/09/2020 X-rays demonstrate marked  tricompartmental degenerative changes  CT HEAD CODE STROKE WO CONTRAST  Addendum Date: 08/26/2020   ADDENDUM REPORT: 08/15/2020 14:03 ADDENDUM: Question of hyperdensity along the left M2 MCA in the sylvian fissure. Electronically Signed   By: Macy Mis M.D.   On: 08/15/2020 14:03   Result Date: 08/10/2020 CLINICAL DATA:  Right facial droop EXAM: CT HEAD WITHOUT CONTRAST TECHNIQUE: Contiguous axial images were obtained from the base of the skull through the vertex without intravenous contrast. COMPARISON:  None. FINDINGS: Brain: There is no acute intracranial hemorrhage or mass effect. Gray-white differentiation is preserved. There are new infarcts involving the left greater than right cerebellum and left basal ganglia and adjacent white matter. Additional patchy and confluent areas of hypoattenuation in the supratentorial white matter are nonspecific but may reflect mild to moderate chronic microvascular ischemic changes. Chronic bilateral basal ganglia infarcts and/or perivascular spaces. Prominence of the ventricles and sulci reflects generalized parenchymal volume loss without substantial progression. No extra-axial fluid collection. Vascular: No hyperdense vessel. There is intracranial atherosclerotic calcification at the skull base. Skull: Unremarkable. Sinuses/Orbits: No acute abnormality. Other: Mastoid air cells are clear. IMPRESSION: No acute intracranial hemorrhage or mass effect. New age-indeterminate small vessel infarcts involving left greater than right cerebellum and left basal ganglia and adjacent white matter. These results were called by telephone at the time of interpretation on 08/22/2020 at 12:36 pm to provider [facility] , who verbally acknowledged these results. Electronically Signed: By: Macy Mis M.D. On: 08/15/2020 12:41  VAS US CAROTID  Result Date: 08/15/2020 Carotid Arterial Duplex Study Indications:       Syncope. Risk Factors:      Hypertension, hyperlipidemia,  prior CVA. Comparison Study:  no prior Performing Technologist: Abram Sander RVS  Examination Guidelines: A complete evaluation includes B-mode imaging, spectral Doppler, color Doppler, and power Doppler as needed of all accessible portions of each vessel. Bilateral testing is considered an integral part of a complete examination. Limited examinations for reoccurring indications may be performed as noted.  Right Carotid Findings: +----------+--------+--------+--------+------------------+--------+           PSV cm/sEDV cm/sStenosisPlaque DescriptionComments +----------+--------+--------+--------+------------------+--------+ CCA Prox  65      13              heterogenous               +----------+--------+--------+--------+------------------+--------+ CCA Distal63      17              heterogenous               +----------+--------+--------+--------+------------------+--------+ ICA Prox  59      10      1-39%   heterogenous               +----------+--------+--------+--------+------------------+--------+ ICA Distal69      17                                         +----------+--------+--------+--------+------------------+--------+ ECA       82                                                 +----------+--------+--------+--------+------------------+--------+ +----------+--------+-------+--------+-------------------+           PSV cm/sEDV cmsDescribeArm Pressure (mmHG) +----------+--------+-------+--------+-------------------+ [id number]                                         +----------+--------+-------+--------+-------------------+ +---------+--------+--+--------+--+---------+ VertebralPSV cm/s63EDV cm/s14Antegrade +---------+--------+--+--------+--+---------+  Left Carotid Findings: +----------+--------+--------+--------+------------------+--------+           PSV cm/sEDV cm/sStenosisPlaque DescriptionComments  +----------+--------+--------+--------+------------------+--------+ CCA Prox  99      19              heterogenous               +----------+--------+--------+--------+------------------+--------+ CCA Distal65      11              heterogenous               +----------+--------+--------+--------+------------------+--------+ ICA Prox  52      21      1-39%   heterogenous      tortuous +----------+--------+--------+--------+------------------+--------+ ICA Distal77      22                                         +----------+--------+--------+--------+------------------+--------+ ECA       91                                                 +----------+--------+--------+--------+------------------+--------+ +----------+--------+--------+--------+-------------------+  PSV cm/sEDV cm/sDescribeArm Pressure (mmHG) +----------+--------+--------+--------+-------------------+ [id number]                                          +----------+--------+--------+--------+-------------------+ +---------+--------+---+--------+--+---------+ VertebralPSV cm/s103EDV cm/s13Antegrade +---------+--------+---+--------+--+---------+   Summary: Right Carotid: Velocities in the right ICA are consistent with a 1-39% stenosis. Left Carotid: Velocities in the left ICA are consistent with a 1-39% stenosis. Vertebrals: Bilateral vertebral arteries demonstrate antegrade flow. *See table(s) above for measurements and observations.  Electronically signed by Ruta Hinds MD on 08/15/2020 at 5:03:40 PM.    Final    CT ANGIO HEAD CODE STROKE  Result Date: 08/24/2020 CLINICAL DATA:  Neuro deficit, acute, stroke suspected. Additional history provided: Right-sided facial droop, slurred speech. EXAM: CT ANGIOGRAPHY HEAD AND NECK CT PERFUSION BRAIN TECHNIQUE: Multidetector CT imaging of the head and neck was performed using the standard protocol during bolus administration of intravenous  contrast. Multiplanar CT image reconstructions and MIPs were obtained to evaluate the vascular anatomy. Carotid stenosis measurements (when applicable) are obtained utilizing NASCET criteria, using the distal internal carotid diameter as the denominator. Multiphase CT imaging of the brain was performed following IV bolus contrast injection. Subsequent parametric perfusion maps were calculated using RAPID software. CONTRAST:  76mL OMNIPAQUE IOHEXOL 350 MG/ML SOLN COMPARISON:  Noncontrast head CT performed earlier today 08/14/2020. FINDINGS: CTA NECK FINDINGS Aortic arch: The left vertebral artery arises directly from the aortic arch. Atherosclerotic plaque within the visualized aortic arch and proximal major branch vessels of the neck. No hemodynamically significant innominate or proximal subclavian artery stenosis. Right carotid system: CCA and ICA patent within the neck without stenosis. Mild calcified plaque within the carotid bifurcation and proximal ICA. Left carotid system: CCA and ICA patent within the neck without significant stenosis (50% or greater). Moderate calcified plaque within the carotid bifurcation and proximal ICA. Vertebral arteries: Vertebral arteries codominant and patent within the neck without stenosis. Nonstenotic calcified plaque at the origin of the left vertebral artery. Skeleton: No acute bony abnormality or aggressive osseous lesion. Cervical spondylosis with multilevel disc space narrowing, disc bulges, posterior disc osteophytes, uncovertebral hypertrophy and facet arthrosis. Suspected degenerative fusion across the disc spaces at C4-C5 and C6-C7. Grade 1 anterolisthesis at C3-C4, C4-C5, C5-C6. Other neck: No pathologically enlarged cervical chain lymph nodes. Multiple thyroid nodules, the largest within the left lobe measuring 2.3 cm (series 7, image 110). Upper chest: No consolidation within the imaged lung apices. Review of the MIP images confirms the above findings CTA HEAD  FINDINGS Anterior circulation: The intracranial internal carotid arteries are patent. Calcified plaque within both vessels with no more than mild stenosis. The M1 middle cerebral arteries are patent. Occlusion of a superior division proximal M2 left MCA vessel (series 11, image 23) (series 9, image 45). The A1 right anterior cerebral artery is markedly hypoplastic or absent on a developmental basis. The anterior cerebral arteries are otherwise patent. No intracranial aneurysm is identified. Posterior circulation: The intracranial vertebral arteries are patent. Nonstenotic calcified plaque within the left V4 segment. The basilar artery is patent. The posterior cerebral arteries are patent. Posterior communicating arteries are present bilaterally. Venous sinuses: Within the limitations of contrast timing, no convincing thrombus. Anatomic variants: As described Review of the MIP images confirms the above findings CT Brain Perfusion Findings: The perfusion software detects suboptimal contrast bolus timing and/or excessive motion, limiting the reliability of the perfusion data. CBF (<30%) Volume: 14mL  Perfusion (Tmax>6.0s) volume: 73mL (predominantly within the left MCA vascular territory). Mismatch Volume: 12mL Infarction Location:None identified Emergent findings were called by telephone at the time of interpretation on 08/10/2020 at 1:54 pm to provider Dr. Gilford Raid, who verbally acknowledged these results. IMPRESSION: CTA neck: 1. The common carotid, internal carotid and vertebral arteries are patent within the neck without hemodynamically significant stenosis. Atherosclerotic disease within these vessels as described. Most notably, there is moderate calcified plaque within the left carotid bifurcation and proximal ICA. 2. Multiple thyroid nodules, the largest within the left lobe measuring 2.3 cm. Nonemergent thyroid ultrasound is recommended for further evaluation. CTA head: 1. Occlusion of a superior division  proximal M2 left MCA vessel. Emergent neuro-interventional consultation is recommended. 2. Calcified plaque within the intracranial ICAs with no more than mild resultant stenosis. CT perfusion head: 1. The perfusion software detects suboptimal contrast bolus timing and/or excessive motion, limiting the reliability of the perfusion data. 2. The perfusion software identifies a 19 mL region of hypoperfused parenchyma predominantly within the left MCA vascular territory. No core infarct is detected. Electronically Signed   By: Kellie Simmering DO   On: 09/02/2020 14:15   CT ANGIO NECK CODE STROKE  Result Date: 09/01/2020 CLINICAL DATA:  Neuro deficit, acute, stroke suspected. Additional history provided: Right-sided facial droop, slurred speech. EXAM: CT ANGIOGRAPHY HEAD AND NECK CT PERFUSION BRAIN TECHNIQUE: Multidetector CT imaging of the head and neck was performed using the standard protocol during bolus administration of intravenous contrast. Multiplanar CT image reconstructions and MIPs were obtained to evaluate the vascular anatomy. Carotid stenosis measurements (when applicable) are obtained utilizing NASCET criteria, using the distal internal carotid diameter as the denominator. Multiphase CT imaging of the brain was performed following IV bolus contrast injection. Subsequent parametric perfusion maps were calculated using RAPID software. CONTRAST:  8mL OMNIPAQUE IOHEXOL 350 MG/ML SOLN COMPARISON:  Noncontrast head CT performed earlier today 09/01/2020. FINDINGS: CTA NECK FINDINGS Aortic arch: The left vertebral artery arises directly from the aortic arch. Atherosclerotic plaque within the visualized aortic arch and proximal major branch vessels of the neck. No hemodynamically significant innominate or proximal subclavian artery stenosis. Right carotid system: CCA and ICA patent within the neck without stenosis. Mild calcified plaque within the carotid bifurcation and proximal ICA. Left carotid system: CCA and  ICA patent within the neck without significant stenosis (50% or greater). Moderate calcified plaque within the carotid bifurcation and proximal ICA. Vertebral arteries: Vertebral arteries codominant and patent within the neck without stenosis. Nonstenotic calcified plaque at the origin of the left vertebral artery. Skeleton: No acute bony abnormality or aggressive osseous lesion. Cervical spondylosis with multilevel disc space narrowing, disc bulges, posterior disc osteophytes, uncovertebral hypertrophy and facet arthrosis. Suspected degenerative fusion across the disc spaces at C4-C5 and C6-C7. Grade 1 anterolisthesis at C3-C4, C4-C5, C5-C6. Other neck: No pathologically enlarged cervical chain lymph nodes. Multiple thyroid nodules, the largest within the left lobe measuring 2.3 cm (series 7, image 110). Upper chest: No consolidation within the imaged lung apices. Review of the MIP images confirms the above findings CTA HEAD FINDINGS Anterior circulation: The intracranial internal carotid arteries are patent. Calcified plaque within both vessels with no more than mild stenosis. The M1 middle cerebral arteries are patent. Occlusion of a superior division proximal M2 left MCA vessel (series 11, image 23) (series 9, image 45). The A1 right anterior cerebral artery is markedly hypoplastic or absent on a developmental basis. The anterior cerebral arteries are otherwise patent. No intracranial aneurysm is  identified. Posterior circulation: The intracranial vertebral arteries are patent. Nonstenotic calcified plaque within the left V4 segment. The basilar artery is patent. The posterior cerebral arteries are patent. Posterior communicating arteries are present bilaterally. Venous sinuses: Within the limitations of contrast timing, no convincing thrombus. Anatomic variants: As described Review of the MIP images confirms the above findings CT Brain Perfusion Findings: The perfusion software detects suboptimal contrast bolus  timing and/or excessive motion, limiting the reliability of the perfusion data. CBF (<30%) Volume: 41mL Perfusion (Tmax>6.0s) volume: 81mL (predominantly within the left MCA vascular territory). Mismatch Volume: 44mL Infarction Location:None identified Emergent findings were called by telephone at the time of interpretation on 08/14/2020 at 1:54 pm to provider Dr. Gilford Raid, who verbally acknowledged these results. IMPRESSION: CTA neck: 1. The common carotid, internal carotid and vertebral arteries are patent within the neck without hemodynamically significant stenosis. Atherosclerotic disease within these vessels as described. Most notably, there is moderate calcified plaque within the left carotid bifurcation and proximal ICA. 2. Multiple thyroid nodules, the largest within the left lobe measuring 2.3 cm. Nonemergent thyroid ultrasound is recommended for further evaluation. CTA head: 1. Occlusion of a superior division proximal M2 left MCA vessel. Emergent neuro-interventional consultation is recommended. 2. Calcified plaque within the intracranial ICAs with no more than mild resultant stenosis. CT perfusion head: 1. The perfusion software detects suboptimal contrast bolus timing and/or excessive motion, limiting the reliability of the perfusion data. 2. The perfusion software identifies a 19 mL region of hypoperfused parenchyma predominantly within the left MCA vascular territory. No core infarct is detected. Electronically Signed   By: Kellie Simmering DO   On: 08/29/2020 14:15   IR ANGIO VERTEBRAL SEL VERTEBRAL UNI L MOD SED  Result Date: 08/23/2020 Narrative & Impression INDICATION: New onset right-sided weakness with global aphasia. Occluded superior division of the left middle cerebral artery M2 segment.  EXAM: 1. EMERGENT LARGE VESSEL OCCLUSION THROMBOLYSIS (anterior CIRCULATION)  COMPARISON:  CT angio the head and neck of August 18, 2020.  MEDICATIONS: Ancef 2 g IV antibiotic was administered within 1  hour of the procedure.  ANESTHESIA/SEDATION: General anesthesia  CONTRAST:  Isovue 300 approximately 125 mL  FLUOROSCOPY TIME:  Fluoroscopy Time: 62 minutes 30 seconds (789 mGy).  COMPLICATIONS: None immediate.  TECHNIQUE: Following a full explanation of the procedure along with the potential associated complications, an informed witnessed consent was obtained. The risks of intracranial hemorrhage of 10%, worsening neurological deficit, ventilator dependency, death and inability to revascularize were all reviewed in detail with the patient's daughter  The patient was then put under general anesthesia by the Department of Anesthesiology at [facility].  The right groin was prepped and draped in the usual sterile fashion. Thereafter using modified Seldinger technique, transfemoral access into the right common femoral artery was obtained without difficulty. Over a 0.035 inch guidewire an 8 French 25 cm Pinnacle sheath was inserted. Through this, and also over a 0.035 inch guidewire a [place] JB 1 catheter was advanced to the aortic arch region and selectively positioned in the left common carotid artery and the left vertebral artery.  FINDINGS: The left common carotid arteriogram demonstrates a severe tortuosity in its proximal 1/3. More distally the left external carotid artery and its branches opacify adequately.  The left internal carotid artery at the bulb to the cranial skull base opacifies widely. The petrous, the cavernous and the supraclinoid segments are widely patent.  The left posterior communicating artery is seen opacifying the left posterior cerebral artery distribution  transiently.  The left middle cerebral artery opacifies proximally in the M1 segment and also the inferior division. Occlusion of the superior division in the proximal M2 segment is noted.  The left anterior cerebral artery opacifies into the capillary and venous phases.  The delayed arterial phase demonstrates  retrograde opacification of the left middle cerebral artery superior division territory from the distal collaterals arising from the pericallosal and the callosal marginal branches of the left anterior cerebral artery.  Prompt opacification via the anterior communicating artery of the right anterior cerebral A2 segment and distally is noted.  The left vertebral artery origin is widely patent. Moderate tortuosity noted of the proximal left vertebral artery just distal to its origin. More distally the vessel opacifies to the cranial skull base.  The left vertebrobasilar junction, the left posterior-inferior cerebellar artery, and the opacified basilar artery, the left posterior cerebral artery and the superior cerebellar artery demonstrate patency into the delayed venous phase.  Unopacified blood is noted in the basilar artery from the contralateral vertebral artery.  PROCEDURE: Over a 0.035 inch Roadrunner guidewire, and then an 035 inch regular Glidewire, and an 035 inch Rosen exchange guidewire multiple attempts were made to advance initially a JB 1 catheter, and then a select Penumbra Simmons 2 catheter, a regular Simmons 2 catheter, a Simmons 3 diagnostic catheter, and finally a Simmons 2 6 French 90 cm Envoy guide catheter.  Through each of these, initially an 027 160 cm Marksman microcatheter, duo 156 cm Headway microcatheter, an 035 136 cm catheter were advanced over multiple 014, and 016 micro guidewires to gain distal access to the left middle cerebral artery without success. This was primarily due to the severe tortuosity in the proximal left common carotid artery, in addition to severely distorted anatomy of the brachiocephalic origins of the aortic arch region.  Following multiple failed attempts at gaining access more distally, the procedure was stopped.  A final control arteriogram performed through the proximal left common carotid artery continued to demonstrate occlusion of the superior  division M2 segment of the left middle cerebral artery.  The 8 French Pinnacle sheath was removed and manual compression was applied for hemostasis over about 30 minutes.  The right groin appeared soft. Distal pulses continued to be Dopplerable in both feet unchanged.  The patient's general anesthesia was then reversed. The patient was able to breathe on her own.  However, she remained paralyzed in the right upper and lower extremity, with expressive aphasia.  Patient was then transferred to the PACU and then the neuro ICU for post attempted revascularization.  IMPRESSION: Failed endovascular revascularization of the left middle cerebral artery superior division M2 segment as described above.  PLAN: As per referring MD.   Electronically Signed   By: Luanne Bras M.D.   On: 08/19/2020 10:49    Recent Labs: Lab Results  Component Value Date   WBC 9.4 08/25/2020   HGB 7.9 (L) 08/25/2020   PLT 177 08/25/2020   NA 141 08/25/2020   K 4.7 08/25/2020   CL 109 08/25/2020   CO2 21 (L) 08/25/2020   GLUCOSE 162 (H) 08/25/2020   BUN 95 (H) 08/25/2020   CREATININE 5.83 (H) 08/25/2020   BILITOT 1.1 08/20/2020   ALKPHOS 72 08/24/2020   AST 37 08/29/2020   ALT 28 08/25/2020   PROT 6.2 (L) 08/28/2020   ALBUMIN 2.0 (L) 08/25/2020   CALCIUM 8.3 (L) 08/25/2020   GFRAA 21 (L) 06/02/2020    Speciality Comments: PLQ eye exam:  01/12/2020 & 01/25/2020 normal @ [facility]. Follow up in 1 year.  Procedures:  No procedures performed Allergies: Ace inhibitors, Betadine [povidone iodine], and Valsartan   Assessment / Plan:     Visit Diagnoses: Seronegative rheumatoid arthritis ([place])  High risk medication use  Idiopathic chronic gout of multiple sites without tophus  Primary osteoarthritis of both hands  Trigger finger, left index finger  Primary osteoarthritis of both knees  Primary osteoarthritis of both feet  History of diabetes mellitus, type II  History of  hyperlipidemia  History of diverticulitis  Obstructive sleep apnea on CPAP  History of TIAs  Acute on chronic renal insufficiency  Hypertensive heart disease with chronic diastolic congestive heart failure ([place])  Orders: No orders of the defined types were placed in this encounter.  No orders of the defined types were placed in this encounter.   Face-to-face time spent with patient was *** minutes. Greater than 50% of time was spent in counseling and coordination of care.  Follow-Up Instructions: No follow-ups on file.   Maria Neas, PA-C  Note - This record has been created using Dragon software.  Chart creation errors have been sought, but may not always  have been located. Such creation errors do not reflect on  the standard of medical care.

## 2020-08-25 NOTE — Progress Notes (Signed)
Patient ID: Maria [name], female   DOB: August 06, 1926, 86 y.o.   MRN: [id number] [facility] Progress Note   Assessment/ Plan:   1. Acute kidney Injury: This is suspected likely to be from contrast-induced nephropathy given the timeline of events.  She has improving urine output noted overnight; we will leave her Foley catheter in place as this might herald renal recovery.  Overnight, slight rise of creatinine/BUN but electrolytes/volume status and signs and symptoms do not indicate need for dialysis.  With her current burden of comorbidities, (chronic) dialysis is best avoided given its deleterious impact on her Q OL. 2. Hypertension: Pressure under satisfactory control on metoprolol; earlier on clevidipine supervised by neurology. 3. 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 4. Anemia: Low Tsat-getting IV Fe.  Will give a dose of Aranesp today. 5. [place] metabolic acidosis: Bicarbonate level within acceptable range and without any respiratory compromise. Continue NaHCO3 via NGT.   Subjective:   Overnight without acute events-study transferred to stepdown neurology unit.   Objective:   BP (!) 146/63 (BP Location: Right Arm)   Pulse 94   Temp 98.4 F (36.9 C) (Oral)   Resp 20   Ht 4\' 11"  (1.499 m)   Wt 74 kg   SpO2 99%   BMI 32.95 kg/m   Intake/Output Summary (Last 24 hours) at 08/25/2020 0946 Last data filed at 08/25/2020 0440 Gross per 24 hour  Intake 1685 ml  Output 550 ml  Net 1135 ml   Weight change:   Physical Exam: Gen: Comfortably resting in bed, easy to awaken and engage-expressive aphasia limiting interaction. CVS: Pulse regular rhythm, normal rate, S1 and S2 normal Resp: Clear to auscultation bilaterally, no distinct rales or rhonchi Abd: Soft, flat, nontender, bowel sounds normal Ext: No lower extremity edema  noted  Imaging: No results found.  Labs: BMET Recent Labs  Lab 08/19/20 0529 08/20/20 0435 08/20/20 1757 08/21/20 0442 08/21/20 1700 08/22/20 0541 08/23/20 0215 08/24/20 0101 08/25/20 0025  NA 137 139  --  141  --  140 140 139 141  K 3.5 3.7  --  3.9  --  4.1 5.0 4.8 4.7  CL 102 108  --  110  --  107 109 105 109  CO2 21* 20*  --  19*  --  20* 20* 21* 21*  GLUCOSE 92 105*  --  118*  --  131* 140* 141* 162*  BUN 27* 34*  --  44*  --  58* 74* 83* 95*  CREATININE 1.38* 2.54*  --  3.56*  --  4.43* 5.11* 5.73* 5.83*  CALCIUM 8.2* 8.0*  --  8.1*  --  7.8* 7.9* 8.0* 8.3*  PHOS  --   --  5.6* 6.1* 5.5* 4.8*  --  4.2 3.8   CBC Recent Labs  Lab 09/01/2020 1216 09/05/2020 1226 08/19/20 0529 08/19/20 1200 08/20/20 0435 08/21/20 0442 08/22/20 0541 08/23/20 0215 08/24/20 0101 08/25/20 0025  WBC 9.2  --  8.3  --  7.3   < > 8.7 9.0 9.5 9.4  NEUTROABS 7.5  --  6.7  --  5.8  --   --   --   --   --   HGB 10.2*   < > 8.6*   < > 7.5*   < > 8.5* 8.5* 8.1* 7.9*  HCT 32.8*   < > 28.5*   < > 23.9*   < >  27.7* 27.5* 25.2* 24.0*  MCV 90.9  --  91.1  --  91.9   < > 90.8 90.8 90.0 88.9  PLT 196  --  224  --  196   < > 248 182 169 177   < > = values in this interval not displayed.    Medications:    . [START ON 09/05/2020] allopurinol  50 mg Per Tube Weekly  . aspirin  325 mg Per Tube Daily  . atorvastatin  40 mg Per Tube Daily  . Chlorhexidine Gluconate Cloth  6 each Topical Daily  . donepezil  10 mg Per Tube QPM  . ezetimibe  10 mg Per Tube QHS  . feeding supplement (PROSource TF)  45 mL Per Tube BID  . folic acid-pyridoxine-cyancobalamin  1 tablet Per Tube Daily  . free water  100 mL Per Tube Q4H  . insulin aspart  0-15 Units Subcutaneous Q4H  . mouth rinse  15 mL Mouth Rinse BID  . metoprolol tartrate  25 mg Per Tube BID  . pantoprazole sodium  40 mg Per Tube Daily  . sodium bicarbonate  650 mg Per NG tube TID   Elmarie Shiley, MD 08/25/2020, 9:46 AM

## 2020-08-26 ENCOUNTER — Inpatient Hospital Stay (HOSPITAL_COMMUNITY): Payer: Medicare Other

## 2020-08-26 DIAGNOSIS — Z515 Encounter for palliative care: Secondary | ICD-10-CM

## 2020-08-26 DIAGNOSIS — Z7189 Other specified counseling: Secondary | ICD-10-CM

## 2020-08-26 LAB — GLUCOSE, CAPILLARY
Glucose-Capillary: 149 mg/dL — ABNORMAL HIGH (ref 70–99)
Glucose-Capillary: 154 mg/dL — ABNORMAL HIGH (ref 70–99)
Glucose-Capillary: 156 mg/dL — ABNORMAL HIGH (ref 70–99)
Glucose-Capillary: 173 mg/dL — ABNORMAL HIGH (ref 70–99)
Glucose-Capillary: 180 mg/dL — ABNORMAL HIGH (ref 70–99)
Glucose-Capillary: 193 mg/dL — ABNORMAL HIGH (ref 70–99)

## 2020-08-26 LAB — CBC
HCT: 24.3 % — ABNORMAL LOW (ref 36.0–46.0)
Hemoglobin: 7.9 g/dL — ABNORMAL LOW (ref 12.0–15.0)
MCH: 28.9 pg (ref 26.0–34.0)
MCHC: 32.5 g/dL (ref 30.0–36.0)
MCV: 89 fL (ref 80.0–100.0)
Platelets: 194 10*3/uL (ref 150–400)
RBC: 2.73 MIL/uL — ABNORMAL LOW (ref 3.87–5.11)
RDW: 20.7 % — ABNORMAL HIGH (ref 11.5–15.5)
WBC: 14 10*3/uL — ABNORMAL HIGH (ref 4.0–10.5)
nRBC: 0.8 % — ABNORMAL HIGH (ref 0.0–0.2)

## 2020-08-26 LAB — RENAL FUNCTION PANEL
Albumin: 1.9 g/dL — ABNORMAL LOW (ref 3.5–5.0)
Anion gap: 15 (ref 5–15)
BUN: 101 mg/dL — ABNORMAL HIGH (ref 8–23)
CO2: 20 mmol/L — ABNORMAL LOW (ref 22–32)
Calcium: 8.4 mg/dL — ABNORMAL LOW (ref 8.9–10.3)
Chloride: 108 mmol/L (ref 98–111)
Creatinine, Ser: 5.28 mg/dL — ABNORMAL HIGH (ref 0.44–1.00)
GFR, Estimated: 7 mL/min — ABNORMAL LOW (ref >60)
Glucose, Bld: 139 mg/dL — ABNORMAL HIGH (ref 70–99)
Phosphorus: 3.5 mg/dL (ref 2.5–4.6)
Potassium: 4.5 mmol/L (ref 3.5–5.1)
Sodium: 143 mmol/L (ref 135–145)

## 2020-08-26 MED ORDER — FLUCONAZOLE 100MG IVPB
50.0000 mg | INTRAVENOUS | Status: DC
  Administered 2020-08-26 – 2020-08-27 (×2): 50 mg via INTRAVENOUS
  Filled 2020-08-26 (×4): qty 25

## 2020-08-26 NOTE — Progress Notes (Signed)
Occupational Therapy Treatment Patient Details Name: Maria [name] MRN: [id number] DOB: Mar 18, 1926 Today's Date: 08/26/2020    History of present illness 86 yo female back to ED with inability to ambulate, weakness, pain. Hx of severe OA R knee, DM, CKD, HF, OSA, syncope (she discharged home from [facility] on 12/7 as she refused SNF placement, but returned that pm due to inability to care for self.  While waiting in ED for placement on 12/9 she was noted to have Rt sided weakness, and decreased ability to communicate. CTA showed M2 occlusion.  Attempt at revascularization was unsuccessful.   OT comments  Pt making gradual progress towards OT goals this session. Pt continues to present with R sided weakness, impaired ability to communicate and decreased activity tolerance. Pt unable to follow commands related to mobility tasks, pt does nod head throughout session but spontaneously. Pt attempted to communicate verbally during session however speech is unintelligible. Session focus on BADL reeducation with pt needing total A for grooming tasks such as oral care and face washing. Pt required MOD- MAX A to reposition in bed. Pt would continue to benefit from skilled occupational therapy while admitted and after d/c to address the below listed limitations in order to improve overall functional mobility and facilitate independence with BADL participation. DC plan remains appropriate, will follow acutely per POC.     Follow Up Recommendations  SNF    Equipment Recommendations  None recommended by OT    Recommendations for Other Services      Precautions / Restrictions Precautions Precautions: Fall Precaution Comments: h/o syncope during past hospitalization, NG tube Restrictions Weight Bearing Restrictions: No       Mobility Bed Mobility Overal bed mobility: Needs Assistance Bed Mobility: Rolling Rolling: Max assist;Mod assist         General bed mobility comments: MOD A - MAX A to roll  R<>L to reposition pt, pt with R lateral lean needing support on R side in bed to maintain upright position  Transfers                 General transfer comment: did not attempt this date    Balance                                           ADL either performed or assessed with clinical judgement   ADL Overall ADL's : Needs assistance/impaired     Grooming: Oral care;Wash/dry face;Bed level;Total assistance Grooming Details (indicate cue type and reason): total A to perform oral care with suction and to wash face, pt unable to follow commands such as "stick your tongue out"                   Toilet Transfer Details (indicate cue type and reason): unable         Functional mobility during ADLs: Maximal assistance (to reposition in bed) General ADL Comments: pt presents with R sided weakness, decreased activity tolerance and cognitive deficits. session focus on BADL reeeducation and bed mobility     Vision   Additional Comments: pt able to track therapists and tech R<>L with cues; pt responds well to elf doll in room named "Ulice Dash" when completing visual tasks   Perception     Praxis      Cognition Arousal/Alertness: Awake/alert Behavior During Therapy: [facility] for tasks assessed/performed Overall Cognitive Status: Difficult to assess  General Comments: difficult to assess cognition secondary to imparied communication, pt attempting to communicate however speech is unintelligible. pt nodding during session but not appropriately and unable to follow commands related to mobility tasks.        Exercises     Shoulder Instructions       General Comments      Pertinent Vitals/ Pain       Pain Assessment: Faces Faces Pain Scale: Hurts little more Pain Location: generalized with mobility Pain Descriptors / Indicators: Discomfort;Grimacing Pain Intervention(s): Limited activity within patient's  tolerance;Monitored during session;Repositioned  Home Living                                          Prior Functioning/Environment              Frequency  Min 2X/week        Progress Toward Goals  OT Goals(current goals can now be found in the care plan section)  Progress towards OT goals: Progressing toward goals  Acute Rehab OT Goals Patient Stated Goal: to get better OT Goal Formulation: Patient unable to participate in goal setting Time For Goal Achievement: 09/02/20 Potential to Achieve Goals: Good  Plan Discharge plan remains appropriate;Frequency remains appropriate    Co-evaluation                 AM-PAC OT "6 Clicks" Daily Activity     Outcome Measure   Help from another person eating meals?: Total (NPO) Help from another person taking care of personal grooming?: Total Help from another person toileting, which includes using toliet, bedpan, or urinal?: Total Help from another person bathing (including washing, rinsing, drying)?: Total Help from another person to put on and taking off regular upper body clothing?: Total Help from another person to put on and taking off regular lower body clothing?: Total 6 Click Score: 6    End of Session    OT Visit Diagnosis: Hemiplegia and hemiparesis Hemiplegia - Right/Left: Right Hemiplegia - dominant/non-dominant: Dominant Hemiplegia - caused by: Cerebral infarction Pain - Right/Left: Right   Activity Tolerance Patient tolerated treatment well   Patient Left in bed;with call bell/phone within reach;with bed alarm set   Nurse Communication Mobility status        Time: 3606-7703 OT Time Calculation (min): 19 min  Charges: OT General Charges $OT Visit: 1 Visit OT Treatments $Self Care/Home Management : 8-22 mins  Lanier Clam., COTA/L Acute Rehabilitation Services [phone]    Ihor Gully 08/26/2020, 2:23 PM

## 2020-08-26 NOTE — Progress Notes (Signed)
STROKE TEAM PROGRESS NOTE   INTERVAL HISTORY No family is at bedside.  Patient no significant change from yesterday, still somehow lethargic but stable.  Urine output improved, creatinine slightly trending down from yesterday.  Palliative care consult initiated for [place] discussion.  Vitals:   08/25/20 1243 08/25/20 1614 08/25/20 1825 08/25/20 1949  BP: (!) 143/56 135/71 (!) 111/53 (!) 129/51  Pulse: 96 (!) 129 95 99  Resp: 19 16 18 16   Temp: 99.1 F (37.3 C) 99.1 F (37.3 C) 99.5 F (37.5 C) 98.7 F (37.1 C)  TempSrc: Oral Oral Axillary Axillary  SpO2: 96% 98% 97% 96%  Weight:      Height:       CBC:  Recent Labs  Lab 08/19/20 0529 08/19/20 1200 08/20/20 0435 08/21/20 0442 08/24/20 0101 08/25/20 0025  WBC 8.3  --  7.3   < > 9.5 9.4  NEUTROABS 6.7  --  5.8  --   --   --   HGB 8.6*   < > 7.5*   < > 8.1* 7.9*  HCT 28.5*   < > 23.9*   < > 25.2* 24.0*  MCV 91.1  --  91.9   < > 90.0 88.9  PLT 224  --  196   < > 169 177   < > = values in this interval not displayed.   Basic Metabolic Panel:  Recent Labs  Lab 08/21/20 1700 08/22/20 0541 08/23/20 0215 08/24/20 0101 08/25/20 0025  NA  --  140   < > 139 141  K  --  4.1   < > 4.8 4.7  CL  --  107   < > 105 109  CO2  --  20*   < > 21* 21*  GLUCOSE  --  131*   < > 141* 162*  BUN  --  58*   < > 83* 95*  CREATININE  --  4.43*   < > 5.73* 5.83*  CALCIUM  --  7.8*   < > 8.0* 8.3*  MG 1.9 1.9  --   --   --   PHOS 5.5* 4.8*  --  4.2 3.8   < > = values in this interval not displayed.   Lipid Panel:  Recent Labs  Lab 08/19/20 0529 08/23/20 0215  CHOL 104  --   TRIG 76 73  HDL 39*  --   CHOLHDL 2.7  --   VLDL 15  --   LDLCALC 50  --    HgbA1c:  Recent Labs  Lab 08/19/20 0528  HGBA1C 5.6   Urine Drug Screen:  No results for input(s): LABOPIA, COCAINSCRNUR, LABBENZ, AMPHETMU, THCU, LABBARB in the last 168 hours.  Alcohol Level  No results for input(s): ETH in the last 168 hours.  IMAGING past 24 hours No results  found.   PHYSICAL EXAM   Temp:  [98.4 F (36.9 C)-99.5 F (37.5 C)] 98.7 F (37.1 C) (12/16 1949) Pulse Rate:  [94-129] 99 (12/16 1949) Resp:  [16-20] 16 (12/16 1949) BP: (107-146)/(49-71) 129/51 (12/16 1949) SpO2:  [96 %-99 %] 96 % (12/16 1949)  General - Well nourished, well developed, not in acute distress, mildly lethargic.  Ophthalmologic - fundi not visualized due to noncooperation.  Cardiovascular - Regular rhythm and rate.  Neuro - eyes open, mildly lethargic, still has severe dysarthria, intangible words. Expressive > receptive aphasia. Not able to answer orientation question, followed midline commands but not peripheral commands. Eyes midline, able to gaze bilaterally, tracking  bilaterally, however, blinking to visual threat on the left but not on the right. Right facial droop, right eye closure weaker than left. Tongue protrusion to the right. LUE at least 3/5 without drift and BLE slight withdraw to pain. RUE flaccid. Bilaterally babinski positive.   ASSESSMENT/PLAN [name] is a 86 y.o. female with history of previous stroke, HTN, HLD, initially admitted for acute deconditioning, gen weakness post anemia/gastritis. She was an in-house code stroke when staff noted a change in mentation. CTA showed Left M2 occlusion for which she had emergent thrombectomy. Had IR - 12/9 5:25 PM - Occluded Lt MCA inf division in the mid M2 seg. Delated partial reconstitution of Lt MCA from Lt ACA collaterals retrogradely. Unsuccessful attempts at  revascularization due to severe prox  Lt ICA tortuosity and of the aortic arch.  Stroke: L MCA moderate stroke due to left M2 occlusion with unsuccessful IR and in TIMELESS trial, likely cardioembolic etiology  Code Stroke CT head: hyperdensity along the left M2 MCA in the sylvian fissure  CTA head & neck Distal branch of M2 occlusion.   CT perfusion 42ml with missmatch  MRI  Multiple scattered tiny strokes bilat, moderate LMCA  stroke, small amt of petechial hemorrhage noted in bed of infarct. Small vessel ischemic disease and old lacunar strokes noted.   Repeat CTA unchanged left M2 occlusion  2D Echo -08/11/2020 - EF > 75%. No cardiac source of emboli identified.   LDL 50  HgbA1c 5.6  UDS - negative  SARS - negative  VTE prophylaxis - post TIMELESS trial, hold for possible IV thrombolytic administration - SCDs  ASA 81mg  (but this was recently put on hold by GI for acute gastritis and anemia) prior to admission, now on ASA 325 mg daily  Therapy recommendations:  SNF  Disposition:  Pending - palliative care on board and pending discussion of [place]  Recent GIB with anemia  12/2 admission for syncope, GIB  Found to have severe anemia with Hb 6.9  Received PRBC   EGD done showed NSAID related gastritis and gastric ulcers and Cameron's erosions. Per GI at that time, hold ASA for2 weeks from admission (restart 08/24/20), continue BID PPI for 4 months, start PO iron in 1 week.  This admission Hb 10.2->...->7.5->8.2->8.5->8.5->8.1->7.9->7.9  CBC monitoring  Iron resumed -> receiving iron IV daily now  PPI IV bid  AKI on CKD  Likely d/t contrast nephropathy  Cre 1.12->1.07->1.38->2.54->3.56->4.43->5.11->5.73->5.83->5.28  On IVF - NS @ 50-> LR @ 50->off   More urine output now  Nephrology onboard, appreciate help  On TF with Glucerna @ 45 and Free water 100 ml Q 4 hrs  received albumin/lasix challenge 12/14.   BMP monitoring  Trying to avoid dialysis  Hypertension  Home meds:  Toprol XL 25 mg daily, Lasix 40mg    Off cleviprex  Current BP meds - metoprolol 12.5->25->12.5 bid  Stable on the high end . Long-term BP goal 130-150 given left M2 occlusion  Hyperlipidemia  Home meds:  Lipitor 40mg  and Zetia 10mg    Home meds resumed   LDL 50, goal < 70  Continue statin at discharge  Diabetes type II Controlled  Home meds:  Insulin  Now - SSI  HgbA1c 5.6, goal < 7.0  CBG  monitoring  Avoid hypoglycemia  Dysphagia   Did not pass swallow or MBS 12/13  Plan for repeat MBS tomorrow  NPO  NGT -> cortrak  On TF @ 56 and FW  Speech on board  Palliative  care on board  Other Stroke Risk Factors  Advanced Age >/= 13   Coronary artery disease  Congestive heart failure  Other Active Problems and Findings  Incidental finding to f/u on as out pt: Multiple thyroid nodules, the largest within the left lobe measuring 2.3 cm.   Pt was enrolled in TIMELESS trial prior to thrombectomy.  Small right frontal scalp hematoma   Rt groin hematoma - resolving   Soft stool x 2 on 08/21/20  allopurino renal dosing  Hospital day # 8  Rosalin Hawking, MD PhD Stroke Neurology 08/26/2020 9:21 PM   To contact Stroke Continuity provider, please refer to [email or web address] After hours, contact General Neurology

## 2020-08-26 NOTE — Progress Notes (Signed)
Patient ID: Maria [name], female   DOB: [birth year], 86 y.o.   MRN: [id number] [facility] Progress Note   Assessment/ Plan:   1. Acute kidney Injury: This is suspected likely to be from contrast-induced nephropathy given the timeline of events.  Excellent urine output noted overnight-nonoliguric with corresponding improvement of BUN and creatinine.  This is indicated that renal recovery is likely underway.  She does not have any acute electrolyte abnormalities or volume excess prompting intervention. 2. Hypertension: Pressure under satisfactory control on metoprolol; earlier on clevidipine supervised by neurology. 3. 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 4. Anemia: Low Tsat-getting IV Fe and status post Aranesp earlier. 5. [place] metabolic acidosis: Bicarbonate level within acceptable range and without any respiratory compromise.  I will continue sodium bicarbonate at this time and discontinue it as levels improved with renal recovery.  Subjective:   Without acute events overnight; had swallow evaluation done earlier today.   Objective:   BP (!) 129/51 (BP Location: Left Arm)   Pulse 99   Temp 98.7 F (37.1 C) (Axillary)   Resp 16   Ht 4\' 11"  (1.499 m)   Wt 74 kg   SpO2 96%   BMI 32.95 kg/m   Intake/Output Summary (Last 24 hours) at 08/26/2020 0919 Last data filed at 08/26/2020 0600 Gross per 24 hour  Intake --  Output 1600 ml  Net -1600 ml   Weight change:   Physical Exam: Gen: Comfortably resting in bed, just got back from swallow evaluation. CVS: Pulse regular rhythm, normal rate, S1 and S2 normal Resp: Clear to auscultation bilaterally, no distinct rales or rhonchi Abd: Soft, flat, nontender, bowel sounds normal Ext: No lower extremity edema noted  Imaging: No results found.  Labs: BMET Recent Labs  Lab 08/20/20 0435  08/20/20 1757 08/21/20 0442 08/21/20 1700 08/22/20 0541 08/23/20 0215 08/24/20 0101 08/25/20 0025 08/26/20 0709  NA 139  --  141  --  140 140 139 141 143  K 3.7  --  3.9  --  4.1 5.0 4.8 4.7 4.5  CL 108  --  110  --  107 109 105 109 108  CO2 20*  --  19*  --  20* 20* 21* 21* 20*  GLUCOSE 105*  --  118*  --  131* 140* 141* 162* 139*  BUN 34*  --  44*  --  58* 74* 83* 95* 101*  CREATININE 2.54*  --  3.56*  --  4.43* 5.11* 5.73* 5.83* 5.28*  CALCIUM 8.0*  --  8.1*  --  7.8* 7.9* 8.0* 8.3* 8.4*  PHOS  --  5.6* 6.1* 5.5* 4.8*  --  4.2 3.8 3.5   CBC Recent Labs  Lab 08/20/20 0435 08/21/20 0442 08/23/20 0215 08/24/20 0101 08/25/20 0025 08/26/20 0709  WBC 7.3   < > 9.0 9.5 9.4 14.0*  NEUTROABS 5.8  --   --   --   --   --   HGB 7.5*   < > 8.5* 8.1* 7.9* 7.9*  HCT 23.9*   < > 27.5* 25.2* 24.0* 24.3*  MCV 91.9   < > 90.8 90.0 88.9 89.0  PLT 196   < > 182 169 177 194   < > = values in this interval not displayed.    Medications:    . [START ON 09/05/2020] allopurinol  50 mg Per Tube Weekly  . aspirin  325 mg Per  Tube Daily  . atorvastatin  40 mg Per Tube Daily  . Chlorhexidine Gluconate Cloth  6 each Topical Daily  . darbepoetin (ARANESP) injection - NON-DIALYSIS  60 mcg Subcutaneous Q Thu-1800  . donepezil  10 mg Per Tube QPM  . ezetimibe  10 mg Per Tube QHS  . feeding supplement (PROSource TF)  45 mL Per Tube BID  . folic acid-pyridoxine-cyancobalamin  1 tablet Per Tube Daily  . free water  100 mL Per Tube Q4H  . insulin aspart  0-15 Units Subcutaneous Q4H  . mouth rinse  15 mL Mouth Rinse BID  . metoprolol tartrate  12.5 mg Per Tube BID  . pantoprazole sodium  40 mg Per Tube Daily  . sodium bicarbonate  650 mg Per NG tube TID   Elmarie Shiley, MD 08/26/2020, 9:19 AM

## 2020-08-26 NOTE — TOC Progression Note (Signed)
Transition of Care [facility]) - Progression Note    Patient Details  Name: Maria [name] MRN: [id number] Date of Birth: [birth year]  Transition of Care [facility]) CM/SW [place], [place] Phone Number: 08/26/2020, 2:21 PM  Clinical Narrative:     CSW  Spoke with the patient's daughter,[name]- her preferred SNF is [facility] when medically stable for discharge or [facility] . No questions or concerns at this time.   Thurmond Butts, MSW, LCSW Clinical Social Worker   Expected Discharge Plan: Skilled Nursing Facility Barriers to Discharge: Continued Medical Work up  Expected Discharge Plan and Services Expected Discharge Plan: [place] arrangements for the past 2 months: Single Family Home                                       Social Determinants of Health (SDOH) Interventions    Readmission Risk Interventions No flowsheet data found.

## 2020-08-26 NOTE — Plan of Care (Signed)

## 2020-08-26 NOTE — Consult Note (Signed)
Palliative Medicine Inpatient Consult Note  Reason for consult:  Goals of Care  HPI:  Per Neurology Note --> [name] is a 86 y.o. female with history of previous stroke, HTN, HLD, initially admitted for acute deconditioning, gen weakness post anemia/gastritis. She was an in-house code stroke when staff noted a change in mentation. CTA showed Left M2 occlusion for which she had emergent thrombectomy. Had IR - 12/9 5:25 PM - Occluded Lt MCA inf division in the mid M2 seg. Delated partial reconstitution of Lt MCA from Lt ACA collaterals retrogradely. Unsuccessful attempts at  revascularization due to severe prox Lt ICA tortuosity and of the aortic arch.  Clinical Assessment/Goals of Care: I have reviewed medical records including EPIC notes, labs and imaging, received report from bedside RN, assessed the patient who was able to open eyes though was aphasic.    I called patients caregiver, Maria [name] (she has worked with the patient for fourteen years as her primary caregiver)  to further discuss diagnosis prognosis, GOC, EOL wishes, disposition and options.   I introduced Palliative Medicine as specialized medical care for people living with serious illness. It focuses on providing relief from the symptoms and stress of a serious illness. The goal is to improve quality of life for both the patient and the family.  Maria [name] is from [facility]. She is a widow and all of her children are deceased though she has five grandchildren. Two live in [place] and three in [place]. 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 She is someone who got great joy out of living. She is a woman of faith and practices within the [employer] denomination.  A detailed discussion was had today regarding advanced directives.  Concepts specific to code status, artifical feeding and hydration, continued IV antibiotics and rehospitalization was had. Patient is presently full code - per [place] she had  elected for this when she was in her right mind prior to all of the more recent events. Maria [name] shares that she and her granddaughter plan to speak about this.   Prior to hospitalization patient was living in an independent living apartment and full functional.   The difference between a aggressive medical intervention path  and a palliative comfort care path for this patient at this time was had.We discussed quality of life and if Maria [name] could view herself from the outside if she feels that this is the quality of life she would want being that she will now be WC bound. Maria [name] shares that she does not think so.   Discussed patients kidney injury and that yes it is improving - talked about what the long term may look like.   Given patients ongoing dysphagia we discussed if in her case a G-Tube would benefit her. I shared that from a quality of life perspective it may pose more burdens than benefits. Maria [name] does understand this though would like to speak to the patients grandchildren more. I provided my phone number so that we can all speak together to better determine the next best steps.   Discussed the importance of continued conversation with family and their  medical providers regarding overall plan of care and treatment options, ensuring decisions are within the context of the patients values and GOCs.  Decision Maker: No documentation in [place]. Patient has five grandchildren and her granddaughter, Maria [name] has helped make decisions in the past.  SUMMARY OF RECOMMENDATIONS   Full Code / Full Scope of Care  for the time being awaiting a conversation with patients granddaughter Maria [name]  Plan to set up a phone meeting with Maria [name] and Maria [name] to determine next steps  Ongoing PMT support  Code Status/Advance Care Planning: FULL CODE  Palliative Prophylaxis:   Oral Care, Range of Motion  Additional Recommendations (Limitations, Scope, Preferences):  Continue current scope of  care  Psycho-social/Spiritual:   Desire for further Chaplaincy support: No  Additional Recommendations: Education on stroke   Prognosis: Poor in the setting of a significant stroke with ongoing dysphagia.   Discharge Planning: Unclear  Vitals:   08/26/20 1016 08/26/20 1134  BP: 104/69 (!) 102/45  Pulse: (!) 104 92  Resp:  19  Temp:  99.6 F (37.6 C)  SpO2:  98%    Intake/Output Summary (Last 24 hours) at 08/26/2020 1350 Last data filed at 08/26/2020 0600 Gross per 24 hour  Intake --  Output 1600 ml  Net -1600 ml   Last Weight  Most recent update: 08/22/2020  5:47 AM   Weight  74 kg (163 lb 2.3 oz)           Gen:  Elderly AA F HEENT: Dry mucous membranes - oral thrush CV: Regular rate and rhythm PULM: clear to auscultation bilaterally. No wheezes/rales/rhonchi ABD: soft/nontender/nondistended/normal bowel sounds EXT: No edema Neuro: Alert  PPS: 10%   This conversation/these recommendations were discussed with patient primary care team, Dr. Erlinda Hong  Time In: 1350 Time Out: 1500 Total Time: 19 Greater than 50%  of this time was spent counseling and coordinating care related to the above assessment and plan.  [place] Team Team Cell Phone: [phone] Please utilize secure chat with additional questions, if there is no response within 30 minutes please call the above phone number  Palliative Medicine Team providers are available by phone from 7am to 7pm daily and can be reached through the team cell phone.  Should this patient require assistance outside of these hours, please call the patient's attending physician.

## 2020-08-26 NOTE — Progress Notes (Signed)
Nutrition Follow-up  DOCUMENTATION CODES:   Not applicable  INTERVENTION:   Continue tube feeding via Cortrak: - Glucerna 1.5 @ 45 ml/hr (1080 ml/day) - ProSource TF 45 ml BID - Free water flushes of 100 ml q 4 hours  Tube feeding regimen provides 1700 kcal, 111 grams of protein, and 820 ml of H2O.   Total free water with flushes: 1420 ml  NUTRITION DIAGNOSIS:   Inadequate oral intake related to inability to eat as evidenced by NPO status.  Ongoing  GOAL:   Patient will meet greater than or equal to 90% of their needs  Met via TF  MONITOR:   Diet advancement,TF tolerance  REASON FOR ASSESSMENT:   Consult Enteral/tube feeding initiation and management  ASSESSMENT:   Pt with PMH of CKD, HTN, gout, dementia, DM, HLD, and previous stroke admitted to ED on 12/7 for acute deconditioning and SNF placement.  12/09 - pt with right-sided weakness and aphasia, per CTA pt with L MCA stroke due to L M2 occlusion with unsuccessful IR  12/11 - failed swallow evaluation, NG tube placed 12/13 - failed MBS, NG tube exchanged for Cortrak 12/17 - repeat MBS with recommendations to continue NPO  Per Nephrology note, excellent urine output noted overnight with corresponding improvement in BUN and creatinine, likely indicating that renal recovery is underway.  Pt remains NPO and failed MBS this morning. Noted Palliative Care has been consulted regarding [place] discussion with granddaughter about PEG tube placement.  Weight trending up from 12/09 to 12/13. Per RN edema documentation, pt with mild pitting generalized edema as well as moderate pitting edema to RUE and mild pitting edema to BLE. No new weights since 12/13. Will continue with current TF regimen at this time.  Briefly met with pt at bedside. Pt asleep at time of visit but woke up briefly to RD voice. Discussed pt with RN. Per RN, pt tolerating tube feeds without difficulty.  Current TF: Glucerna 1.5 @ 45 ml/hr, ProSource TF 45  ml BID, free water 100 ml q 4 hours  Medications reviewed and include: SSI q 4 hours, protonix, sodium bicarb 650 mg TID per tube, IV ferric gluconate, IV fluconazole  Labs reviewed: BUN 101, creatinine 5.28, hemoglobin 7.9 CBG's: 149-165 x 24 hours  UOP: 1600 ml x 24 hours  Diet Order:   Diet Order            Diet NPO time specified  Diet effective now                 EDUCATION NEEDS:   No education needs have been identified at this time  Skin:  Skin Assessment: Skin Integrity Issues: DTI: sacrum Stage II: coccyx Other: MASD to groin  Last BM:  08/25/20 type 7 via rectal tube  Height:   Ht Readings from Last 1 Encounters:  09/04/2020 _0  (1.499 m)    Weight:   Wt Readings from Last 1 Encounters:  08/22/20 74 kg    Ideal Body Weight:  44.6 kg  BMI:  Body mass index is 32.95 kg/m.  Estimated Nutritional Needs:   Kcal:  1550-1700  Protein:  100-115 grams  Fluid:  >1.5 L/day    Gustavus Bryant, MS, RD, LDN Inpatient Clinical Dietitian Please see AMiON for contact information.

## 2020-08-26 NOTE — Progress Notes (Signed)
Modified Barium Swallow Progress Note  Patient Details  Name: Maria [name] MRN: [id number] Date of Birth: 04-06-1926  Today's Date: 08/26/2020  Modified Barium Swallow completed.  Full report located under Chart Review in the Imaging Section.  Brief recommendations include the following:  Clinical Impression  Pt's swallow function has declined mildly from prior MBS. Significant lingual protrusion present and weakness. She was able to retract tongue on request but not maintain this position resulitng in inability to form bolus and honey thick barium fell from right side mouth. With puree, she could not initiate manipulation with SLP strategies of dry spoon presentations with lingual pressure and bolus ultimately suctioned from oral cavity and oral care provided. Non meals of nutrition versus comfort feeds recommended. MD has discussed with granddaughter who had decided to proceed with longer term alternative means of nutrition. ST can provide treatment while here for education and oral manipulation and transit.   Swallow Evaluation Recommendations       SLP Diet Recommendations: NPO       Medication Administration: Via alternative means               Oral Care Recommendations: Oral care QID        [name] 08/26/2020,9:31 AM  [name].Ed Risk analyst [phone] Office [phone]

## 2020-08-26 NOTE — TOC Progression Note (Signed)
Transition of Care [facility]) - Progression Note    Patient Details  Name: Maria [name] MRN: [id number] Date of Birth: [birth year]  Transition of Care [facility]) CM/SW [place], [place] Phone Number: 08/26/2020, 2:03 PM  Clinical Narrative:     CSW left voice message for patient's daughter, [name]- requested call back.  Thurmond Butts, MSW, LCSW Clinical Social Worker   Expected Discharge Plan: Skilled Nursing Facility Barriers to Discharge: Continued Medical Work up  Expected Discharge Plan and Services Expected Discharge Plan: [place] arrangements for the past 2 months: Single Family Home                                       Social Determinants of Health (SDOH) Interventions    Readmission Risk Interventions No flowsheet data found.

## 2020-08-27 LAB — CBC
HCT: 21.4 % — ABNORMAL LOW (ref 36.0–46.0)
Hemoglobin: 7.1 g/dL — ABNORMAL LOW (ref 12.0–15.0)
MCH: 29.8 pg (ref 26.0–34.0)
MCHC: 33.2 g/dL (ref 30.0–36.0)
MCV: 89.9 fL (ref 80.0–100.0)
Platelets: 206 10*3/uL (ref 150–400)
RBC: 2.38 MIL/uL — ABNORMAL LOW (ref 3.87–5.11)
RDW: 20.7 % — ABNORMAL HIGH (ref 11.5–15.5)
WBC: 16.9 10*3/uL — ABNORMAL HIGH (ref 4.0–10.5)
nRBC: 0.9 % — ABNORMAL HIGH (ref 0.0–0.2)

## 2020-08-27 LAB — RENAL FUNCTION PANEL
Albumin: 1.8 g/dL — ABNORMAL LOW (ref 3.5–5.0)
Anion gap: 14 (ref 5–15)
BUN: 104 mg/dL — ABNORMAL HIGH (ref 8–23)
CO2: 22 mmol/L (ref 22–32)
Calcium: 8.3 mg/dL — ABNORMAL LOW (ref 8.9–10.3)
Chloride: 108 mmol/L (ref 98–111)
Creatinine, Ser: 5 mg/dL — ABNORMAL HIGH (ref 0.44–1.00)
GFR, Estimated: 8 mL/min — ABNORMAL LOW (ref >60)
Glucose, Bld: 167 mg/dL — ABNORMAL HIGH (ref 70–99)
Phosphorus: 3.4 mg/dL (ref 2.5–4.6)
Potassium: 4.1 mmol/L (ref 3.5–5.1)
Sodium: 144 mmol/L (ref 135–145)

## 2020-08-27 LAB — GLUCOSE, CAPILLARY
Glucose-Capillary: 154 mg/dL — ABNORMAL HIGH (ref 70–99)
Glucose-Capillary: 204 mg/dL — ABNORMAL HIGH (ref 70–99)
Glucose-Capillary: 172 mg/dL — ABNORMAL HIGH (ref 70–99)
Glucose-Capillary: 170 mg/dL — ABNORMAL HIGH (ref 70–99)
Glucose-Capillary: 170 mg/dL — ABNORMAL HIGH (ref 70–99)
Glucose-Capillary: 166 mg/dL — ABNORMAL HIGH (ref 70–99)
Glucose-Capillary: 190 mg/dL — ABNORMAL HIGH (ref 70–99)

## 2020-08-27 MED ORDER — DARBEPOETIN ALFA 60 MCG/0.3ML IJ SOSY
60.0000 ug | PREFILLED_SYRINGE | INTRAMUSCULAR | Status: DC
  Administered 2020-08-27: 17:00:00 60 ug via SUBCUTANEOUS
  Filled 2020-08-27: qty 0.3

## 2020-08-27 MED ORDER — ACETAMINOPHEN 160 MG/5ML PO SOLN
325.0000 mg | Freq: Four times a day (QID) | ORAL | Status: DC | PRN
  Administered 2020-08-28 – 2020-09-01 (×5): 325 mg
  Filled 2020-08-27 (×4): qty 20.3

## 2020-08-27 MED ORDER — FREE WATER
200.0000 mL | Status: DC
  Administered 2020-08-27 – 2020-08-28 (×6): 200 mL

## 2020-08-27 NOTE — Progress Notes (Signed)
STROKE TEAM PROGRESS NOTE   INTERVAL HISTORY No family is at bedside.  Patient no significant change from yesterday per nursing, still lethargic but stable. Eyes open, gazes at examiner, tried to speak.    Vitals:   08/26/20 1933 08/26/20 2333 08/27/20 0423 08/27/20 0815  BP: (!) 128/53 (!) 135/54 138/62 (!) 114/49  Pulse: 92 98 (!) 104   Resp: 18 20 20    Temp: 98.4 F (36.9 C) 98.6 F (37 C) 98.7 F (37.1 C)   TempSrc: Axillary Axillary Axillary   SpO2: 100% 100% 97%   Weight:      Height:       CBC:  Recent Labs  Lab 08/26/20 0709 08/27/20 0304  WBC 14.0* 16.9*  HGB 7.9* 7.1*  HCT 24.3* 21.4*  MCV 89.0 89.9  PLT 194 240   Basic Metabolic Panel:  Recent Labs  Lab 08/21/20 1700 08/22/20 0541 08/23/20 0215 08/26/20 0709 08/27/20 0304  NA  --  140   < > 143 144  K  --  4.1   < > 4.5 4.1  CL  --  107   < > 108 108  CO2  --  20*   < > 20* 22  GLUCOSE  --  131*   < > 139* 167*  BUN  --  58*   < > 101* 104*  CREATININE  --  4.43*   < > 5.28* 5.00*  CALCIUM  --  7.8*   < > 8.4* 8.3*  MG 1.9 1.9  --   --   --   PHOS 5.5* 4.8*   < > 3.5 3.4   < > = values in this interval not displayed.   Lipid Panel:  Recent Labs  Lab 08/23/20 0215  TRIG 73   HgbA1c:  No results for input(s): HGBA1C in the last 168 hours. Urine Drug Screen:  No results for input(s): LABOPIA, COCAINSCRNUR, LABBENZ, AMPHETMU, THCU, LABBARB in the last 168 hours.  Alcohol Level  No results for input(s): ETH in the last 168 hours.  IMAGING past 24 hours No results found.   PHYSICAL EXAM   Temp:  [98.4 F (36.9 C)-99.6 F (37.6 C)] 98.7 F (37.1 C) (12/18 0423) Pulse Rate:  [92-104] 104 (12/18 0423) Resp:  [18-20] 20 (12/18 0423) BP: (102-138)/(45-83) 114/49 (12/18 0815) SpO2:  [97 %-100 %] 97 % (12/18 0423)  General - Well nourished, well developed, not in acute distress, mildly lethargic.  Ophthalmologic - fundi not visualized due to noncooperation.  Cardiovascular - Regular  rhythm and rate.  Neuro - eyes open, mildly lethargic and falls back to sleep but easily arousable, still has severe dysarthria, intangible words. Expressive > receptive aphasia. Not able to answer questions, followed midline commands but not peripheral commands. Eyes midline, able to gaze bilaterally, tracking bilaterally, however, blinking to visual threat on the left but not on the right. Right facial droop, right eye closure weaker than left. Tongue protrusion to the right. LUE at least 3/5 without drift and BLE slight withdraw to pain. RUE flaccid. Bilaterally babinski positive.   ASSESSMENT/PLAN Maria [name] is a 86 y.o. female with history of previous stroke, HTN, HLD, initially admitted for acute deconditioning, gen weakness post anemia/gastritis. She was an in-house code stroke when staff noted a change in mentation. CTA showed Left M2 occlusion for which she had emergent thrombectomy. Had IR - 12/9 5:25 PM - Occluded Lt MCA inf division in the mid M2 seg. Delated partial reconstitution of  Lt MCA from Lt ACA collaterals retrogradely. Unsuccessful attempts at  revascularization due to severe prox  Lt ICA tortuosity and of the aortic arch.  Stroke: L MCA moderate stroke due to left M2 occlusion with unsuccessful IR and in TIMELESS trial, likely cardioembolic etiology  Code Stroke CT head: hyperdensity along the left M2 MCA in the sylvian fissure  CTA head & neck Distal branch of M2 occlusion.   CT perfusion 37ml with missmatch  MRI  Multiple scattered tiny strokes bilat, moderate LMCA stroke, small amt of petechial hemorrhage noted in bed of infarct. Small vessel ischemic disease and old lacunar strokes noted.   Repeat CTA unchanged left M2 occlusion  2D Echo -08/11/2020 - EF > 75%. No cardiac source of emboli identified.   LDL 50  HgbA1c 5.6  UDS - negative  SARS - negative  VTE prophylaxis - post TIMELESS trial, hold for possible IV thrombolytic administration -  SCDs  ASA 81mg  (but this was recently put on hold by GI for acute gastritis and anemia) prior to admission, now on ASA 325 mg daily  Therapy recommendations:  SNF  Disposition:  Pending - palliative care on board and pending discussion of [place] - appreciate Palliative Care assistance. Pt remains a full code pending conversations with granddaughter.  Recent GIB with anemia  12/2 admission for syncope, GIB  Found to have severe anemia with Hb 6.9  Received PRBC   EGD done showed NSAID related gastritis and gastric ulcers and Cameron's erosions. Per GI at that time, hold ASA for2 weeks from admission (restart 08/24/20), continue BID PPI for 4 months, start PO iron in 1 week.  This admission Hb 10.2->...->7.5->8.2->8.5->8.5->8.1->7.9->7.9->7.1 - may need to consider transfusion - repeat CBC in AM.  CBC monitoring  Iron resumed -> receiving iron IV daily now  PPI IV bid  AKI on CKD  Likely d/t contrast nephropathy  Cre 1.12->1.07->1.38->2.54->3.56->4.43->5.11->5.73->5.83->5.28->5.00 (gradually improving)  On IVF - NS @ 50-> LR @ 50->off   More urine output now  Nephrology onboard, appreciate help  On TF with Glucerna @ 45 and Free water 100 ml Q 4 hrs  received albumin/lasix challenge 12/14.   BMP monitoring  Trying to avoid dialysis  Hypertension  Home meds:  Toprol XL 25 mg daily, Lasix 40mg    Off cleviprex  Current BP meds - metoprolol 12.5->25->12.5 bid  Stable on the high end . Long-term BP goal 130-150 given left M2 occlusion  Hyperlipidemia  Home meds:  Lipitor 40mg  and Zetia 10mg    Home meds resumed   LDL 50, goal < 70  Continue statin at discharge  Diabetes type II Controlled  Home meds:  Insulin  Now - SSI  HgbA1c 5.6, goal < 7.0  CBG monitoring  Avoid hypoglycemia  Dysphagia   Did not pass swallow or MBS 12/13  Repeat MBS 08/26/2020 - pt to remain NPO  NPO  NGT -> cortrak  On TF @ 2 and FW  Speech on board  Palliative  care on board  Other Stroke Risk Factors  Advanced Age >/= 65   Coronary artery disease  Congestive heart failure  Other Active Problems and Findings  Incidental finding to f/u on as out pt: Multiple thyroid nodules, the largest within the left lobe measuring 2.3 cm.   Pt was enrolled in TIMELESS trial prior to thrombectomy.  Small right frontal scalp hematoma   Rt groin hematoma - resolving   Soft stool x 2 on 08/21/20  allopurinlo renal dosing  Labs in  AM  Hospital day # 9   Personally examined patient and images, and have participated in and made any corrections needed to history, physical, neuro exam,assessment and plan as stated above.  I have personally obtained the history, evaluated lab date, reviewed imaging studies and agree with radiology interpretations.    Sarina Ill, MD Stroke Neurology  I spent 15 minutes of face-to-face and non-face-to-face time with patient. This included prechart review, lab review, study review, order entry, electronic health record documentation, patient education on the different diagnostic and therapeutic options, counseling and coordination of care, risks and benefits of management, compliance, or risk factor reduction   To contact Stroke Continuity provider, please refer to [email or web address] After hours, contact General Neurology

## 2020-08-27 NOTE — Plan of Care (Signed)
  Problem: Education: Goal: Knowledge of disease or condition will improve Outcome: Progressing Goal: Knowledge of secondary prevention will improve Outcome: Progressing Goal: Knowledge of patient specific risk factors addressed and post discharge goals established will improve Outcome: Progressing Goal: Individualized Educational Video(s) Outcome: Progressing   Problem: Coping: Goal: Will verbalize positive feelings about self Outcome: Progressing Goal: Will identify appropriate support needs Outcome: Progressing   Problem: Self-Care: Goal: Verbalization of feelings and concerns over difficulty with self-care will improve Outcome: Progressing Goal: Ability to communicate needs accurately will improve Outcome: Progressing   Problem: Nutrition: Goal: Risk of aspiration will decrease Outcome: Progressing   Problem: Ischemic Stroke/TIA Tissue Perfusion: Goal: Complications of ischemic stroke/TIA will be minimized Outcome: Progressing   Problem: Clinical Measurements: Goal: Ability to maintain clinical measurements within normal limits will improve Outcome: Progressing Goal: Respiratory complications will improve Outcome: Progressing Goal: Cardiovascular complication will be avoided Outcome: Progressing   Problem: Activity: Goal: Risk for activity intolerance will decrease Outcome: Progressing   Problem: Nutrition: Goal: Adequate nutrition will be maintained Outcome: Progressing   Problem: Coping: Goal: Level of anxiety will decrease Outcome: Progressing   Problem: Elimination: Goal: Will not experience complications related to bowel motility Outcome: Progressing Goal: Will not experience complications related to urinary retention Outcome: Progressing   Problem: Pain Managment: Goal: General experience of comfort will improve Outcome: Progressing   Problem: Safety: Goal: Ability to remain free from injury will improve Outcome: Progressing   Problem: Skin  Integrity: Goal: Risk for impaired skin integrity will decrease Outcome: Progressing

## 2020-08-27 NOTE — Progress Notes (Signed)
   Palliative Medicine Inpatient Follow Up Note   Reason for consult:  Goals of Care  HPI:  Per Neurology Note --> [name] a 86 y.o.femalewith history of previous stroke, HTN, HLD, initially admitted for acute deconditioning, gen weakness post anemia/gastritis.She was an in-house code stroke when staff noted a change in mentation.CTA showed Left M2 occlusion for which she had emergent thrombectomy. Had IR-12/9 5:25 PM - Occluded Lt MCA inf division in the mid M2 seg. Delated partial reconstitution of Lt MCA from Lt ACA collaterals retrogradely. Unsuccessful attempts at revascularization due to severe prox Lt ICA tortuosity and of the aortic arch.  Today's Discussion (08/27/2020): Patient in no distress at the time of assessment. Oral cavity evaluated - cleaned out. She remains to have aphasia. Able to try to squeeze my hand on the L side.  Have tried to call CG x2 with no response will attempt again tomorrow to set up a phone meeting with granddaughter, Maria [name].  Objective Assessment: Vital Signs Vitals:   08/27/20 0815 08/27/20 1111  BP: (!) 114/49   Pulse:    Resp:    Temp:  98.2 F (36.8 C)  SpO2:      Intake/Output Summary (Last 24 hours) at 08/27/2020 1224 Last data filed at 08/27/2020 1200 Gross per 24 hour  Intake --  Output 1600 ml  Net -1600 ml   Last Weight  Most recent update: 08/22/2020  5:47 AM   Weight  74 kg (163 lb 2.3 oz)           Gen:  Elderly AA F HEENT: Dry mucous membranes - oral thrush CV: Regular rate and rhythm PULM: clear to auscultation bilaterally. No wheezes/rales/rhonchi ABD: soft/nontender/nondistended/normal bowel sounds EXT: No edema Neuro: Alert  SUMMARY OF RECOMMENDATIONS Full Code / Full Scope of Care for the time being awaiting a conversation with patients granddaughter Maria [name]  Plan to set up a phone meeting with Maria [name] and Maria [name] to determine next steps  Ongoing PMT support  Time Spent:  15 Greater than 50% of the time was spent in counseling and coordination of care ______________________________________________________________________________________ [place] Team Team Cell Phone: [phone] Please utilize secure chat with additional questions, if there is no response within 30 minutes please call the above phone number  Palliative Medicine Team providers are available by phone from 7am to 7pm daily and can be reached through the team cell phone.  Should this patient require assistance outside of these hours, please call the patient's attending physician.

## 2020-08-27 NOTE — Progress Notes (Signed)
Patient ID: Maria [name], female   DOB: 30-Jan-1926, 86 y.o.   MRN: [id number] [facility] Progress Note   Assessment/ Plan:   1. Acute kidney Injury: This is suspected likely to be from contrast-induced nephropathy given the timeline of events.  Non-oliguric urine output noted overnight- with some improvement of creatinine but elevated BUN with hypernatremia-- this reflects dehydration for which I will increase her free water flushes to 200cc q 4 hrs. Renal recovery appears to underway. 2. Hypertension: Blood pressure under satisfactory control on metoprolol monoterapy. 3. 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 4. Anemia: Low Tsat-getting IV Fe and status post Aranesp earlier. 5. [place] metabolic acidosis: Bicarbonate level within acceptable range and without any respiratory compromise. Continue on sodium bicarbonate supplement at this time.  Subjective:   Without acute events overnight. She denies any complaints.    Objective:   BP (!) 114/49 (BP Location: Right Arm)   Pulse (!) 104   Temp 98.7 F (37.1 C) (Axillary)   Resp 20   Ht 4\' 11"  (1.499 m)   Wt 74 kg   SpO2 97%   BMI 32.95 kg/m   Intake/Output Summary (Last 24 hours) at 08/27/2020 0848 Last data filed at 08/26/2020 2333 Gross per 24 hour  Intake --  Output 900 ml  Net -900 ml   Weight change:   Physical Exam: Gen: Appears comfortable resting in bed, RN at bedside CVS: Pulse regular rhythm, normal rate, S1 and S2 normal Resp: Clear to auscultation bilaterally, no distinct rales or rhonchi Abd: Soft, flat, nontender, bowel sounds normal Ext: No lower extremity edema noted  Imaging: DG Swallowing Func-Speech Pathology  Result Date: 08/26/2020 Objective Swallowing Evaluation: Type of Study: MBS-Modified Barium Swallow Study  Patient Details Name: Maria [name] MRN:  [id number] Date of Birth: [birth year] Today's Date: 08/26/2020 Time: SLP Start Time (ACUTE ONLY): 0827 -SLP Stop Time (ACUTE ONLY): 0840 SLP Time Calculation (min) (ACUTE ONLY): 13 min Past Medical History: Past Medical History: Diagnosis Date . Anxiety  . Arthritis  . Blood transfusion  . Depression  . Diabetes mellitus  . Edema  . Heart murmur  . Hyperlipidemia  . Hypertension  . Sleep apnea   cpap . Stroke ([place])   3 x tias . TIA (transient ischemic attack)  Past Surgical History: Past Surgical History: Procedure Laterality Date . ABDOMINAL HYSTERECTOMY   . APPENDECTOMY   . BIOPSY  08/13/2020  Procedure: BIOPSY;  Surgeon: Rush Landmark Telford Nab., MD;  Location: [facility];  Service: Gastroenterology;; . ESOPHAGOGASTRODUODENOSCOPY (EGD) WITH PROPOFOL N/A 08/13/2020  Procedure: ESOPHAGOGASTRODUODENOSCOPY (EGD) WITH PROPOFOL;  Surgeon: Irving Copas., MD;  Location: [facility];  Service: Gastroenterology;  Laterality: N/A; . HEMOSTASIS CLIP PLACEMENT  08/13/2020  Procedure: HEMOSTASIS CLIP PLACEMENT;  Surgeon: Irving Copas., MD;  Location: [facility];  Service: Gastroenterology;; . IR ANGIO VERTEBRAL SEL VERTEBRAL UNI L MOD SED  09/03/2020 . IR PERCUTANEOUS ART THROMBECTOMY/INFUSION INTRACRANIAL INC DIAG ANGIO  08/19/2020 . NM MYOVIEW LTD  [id number]  post stress left ventricle is normal in size, ejection fraction is 65%, normal myocardial perfusion study, low risk scan . PARTIAL HYSTERECTOMY   . RADIOLOGY WITH ANESTHESIA N/A 08/23/2020  Procedure: IR WITH ANESTHESIA - CODE STROKE;  Surgeon: Luanne Bras, MD;  Location: [place];  Service: Radiology;  Laterality: N/A; . TRANSTHORACIC ECHOCARDIOGRAM  [id number] HPI: Maria [name] is a 86 y.o. with a history of previous stroke,  hypertension, DM, hyperlipidemia who was recently admitted after syncopal episode and found to have anemia with gastritis and erosions (started on PPI).  Developed with right-sided weakness and aphasia. Found to have cerebral  infarction due to occlusion or stenosis of left middle cerebral artery. CXR clear. No prior ST notes. She had neuro change on 12/11 with MRI pending. Previous MBS 12/13 recommended NPO and repeat today for possible improvements.  No data recorded Assessment / Plan / Recommendation CHL IP CLINICAL IMPRESSIONS 08/26/2020 Clinical Impression Pt's swallow function has declined mildly from prior MBS. Significant lingual protrusion present and weakness. She was able to retract tongue on request but not maintain this position resulitng in inability to form bolus and honey thick barium fell from right side mouth. With puree, she could not initiate manipulation with SLP strategies of dry spoon presentations with lingual pressure and bolus ultimately suctioned from oral cavity and oral care provided. Non meals of nutrition versus comfort feeds recommended. MD has discussed with granddaughter who had decided to proceed with longer term alternative means of nutrition. ST can provide treatment while here for education and oral manipulation and transit. SLP Visit Diagnosis Dysphagia, oral phase (R13.11) Attention and concentration deficit following -- Frontal lobe and executive function deficit following -- Impact on safety and function Severe aspiration risk;Risk for inadequate nutrition/hydration   CHL IP TREATMENT RECOMMENDATION 08/26/2020 Treatment Recommendations Therapy as outlined in treatment plan below   Prognosis 08/26/2020 Prognosis for Safe Diet Advancement Fair Barriers to Reach Goals Severity of deficits Barriers/Prognosis Comment -- CHL IP DIET RECOMMENDATION 08/26/2020 SLP Diet Recommendations NPO Liquid Administration via -- Medication Administration Via alternative means Compensations -- Postural Changes --   CHL IP OTHER RECOMMENDATIONS 08/26/2020 Recommended Consults -- Oral Care Recommendations Oral care QID Other Recommendations --   CHL IP FOLLOW UP RECOMMENDATIONS 08/26/2020 Follow up Recommendations  Skilled Nursing facility   [facility] IP FREQUENCY AND DURATION 08/26/2020 Speech Therapy Frequency (ACUTE ONLY) min 1 x/week Treatment Duration 2 weeks      CHL IP ORAL PHASE 08/26/2020 Oral Phase Impaired Oral - Pudding Teaspoon -- Oral - Pudding Cup -- Oral - Honey Teaspoon Right anterior bolus loss;Holding of bolus Oral - Honey Cup NT Oral - Nectar Teaspoon NT Oral - Nectar Cup NT Oral - Nectar Straw -- Oral - Thin Teaspoon -- Oral - Thin Cup -- Oral - Thin Straw -- Oral - Puree Left anterior bolus loss;Holding of bolus Oral - Mech Soft -- Oral - Regular -- Oral - Multi-Consistency -- Oral - Pill -- Oral Phase - Comment --  CHL IP PHARYNGEAL PHASE 08/26/2020 Pharyngeal Phase (No Data) Pharyngeal- Pudding Teaspoon -- Pharyngeal -- Pharyngeal- Pudding Cup -- Pharyngeal -- Pharyngeal- Honey Teaspoon (No Data) Pharyngeal -- Pharyngeal- Honey Cup NT Pharyngeal -- Pharyngeal- Nectar Teaspoon NT Pharyngeal -- Pharyngeal- Nectar Cup NT Pharyngeal -- Pharyngeal- Nectar Straw -- Pharyngeal -- Pharyngeal- Thin Teaspoon -- Pharyngeal -- Pharyngeal- Thin Cup -- Pharyngeal -- Pharyngeal- Thin Straw -- Pharyngeal -- Pharyngeal- Puree (No Data) Pharyngeal -- Pharyngeal- Mechanical Soft -- Pharyngeal -- Pharyngeal- Regular -- Pharyngeal -- Pharyngeal- Multi-consistency -- Pharyngeal -- Pharyngeal- Pill -- Pharyngeal -- Pharyngeal Comment --  CHL IP CERVICAL ESOPHAGEAL PHASE 08/26/2020 Cervical Esophageal Phase (No Data) Pudding Teaspoon -- Pudding Cup -- Honey Teaspoon -- Honey Cup -- Nectar Teaspoon -- Nectar Cup -- Nectar Straw -- Thin Teaspoon -- Thin Cup -- Thin Straw -- Puree -- Mechanical Soft -- Regular -- Multi-consistency -- Pill -- Cervical Esophageal Comment -- Darliss Cheney  Willis 08/26/2020, 9:29 AM [name].Ed CCC-SLP Speech-Language Pathologist Pager [phone] Office [phone]               Labs: BMET Recent Labs  Lab 08/21/20 0442 08/21/20 1700 08/22/20 0541 08/23/20 0215 08/24/20 0101  08/25/20 0025 08/26/20 0709 08/27/20 0304  NA 141  --  140 140 139 141 143 144  K 3.9  --  4.1 5.0 4.8 4.7 4.5 4.1  CL 110  --  107 109 105 109 108 108  CO2 19*  --  20* 20* 21* 21* 20* 22  GLUCOSE 118*  --  131* 140* 141* 162* 139* 167*  BUN 44*  --  58* 74* 83* 95* 101* 104*  CREATININE 3.56*  --  4.43* 5.11* 5.73* 5.83* 5.28* 5.00*  CALCIUM 8.1*  --  7.8* 7.9* 8.0* 8.3* 8.4* 8.3*  PHOS 6.1* 5.5* 4.8*  --  4.2 3.8 3.5 3.4   CBC Recent Labs  Lab 08/24/20 0101 08/25/20 0025 08/26/20 0709 08/27/20 0304  WBC 9.5 9.4 14.0* 16.9*  HGB 8.1* 7.9* 7.9* 7.1*  HCT 25.2* 24.0* 24.3* 21.4*  MCV 90.0 88.9 89.0 89.9  PLT 169 177 194 206    Medications:    . [START ON 09/05/2020] allopurinol  50 mg Per Tube Weekly  . aspirin  325 mg Per Tube Daily  . atorvastatin  40 mg Per Tube Daily  . Chlorhexidine Gluconate Cloth  6 each Topical Daily  . darbepoetin (ARANESP) injection - NON-DIALYSIS  60 mcg Subcutaneous Q Thu-1800  . donepezil  10 mg Per Tube QPM  . ezetimibe  10 mg Per Tube QHS  . feeding supplement (PROSource TF)  45 mL Per Tube BID  . folic acid-pyridoxine-cyancobalamin  1 tablet Per Tube Daily  . free water  100 mL Per Tube Q4H  . insulin aspart  0-15 Units Subcutaneous Q4H  . mouth rinse  15 mL Mouth Rinse BID  . metoprolol tartrate  12.5 mg Per Tube BID  . pantoprazole sodium  40 mg Per Tube Daily  . sodium bicarbonate  650 mg Per NG tube TID   Elmarie Shiley, MD 08/27/2020, 8:48 AM

## 2020-08-27 NOTE — Progress Notes (Signed)
Attempted to contact MD regarding patient complaint of headache. Non pharmacological interventions tried, no relief. No orders given. Attempted to contact MD again, discussed PRN, no orders given. Several attempts to page/call, MD aware, information passed to night shift nurse Neil Crouch RN

## 2020-08-28 ENCOUNTER — Inpatient Hospital Stay (HOSPITAL_COMMUNITY): Payer: Medicare Other

## 2020-08-28 LAB — CBC
HCT: 23.1 % — ABNORMAL LOW (ref 36.0–46.0)
Hemoglobin: 7.6 g/dL — ABNORMAL LOW (ref 12.0–15.0)
MCH: 29.8 pg (ref 26.0–34.0)
MCHC: 32.9 g/dL (ref 30.0–36.0)
MCV: 90.6 fL (ref 80.0–100.0)
Platelets: 224 10*3/uL (ref 150–400)
RBC: 2.55 MIL/uL — ABNORMAL LOW (ref 3.87–5.11)
RDW: 20.6 % — ABNORMAL HIGH (ref 11.5–15.5)
WBC: 19.3 10*3/uL — ABNORMAL HIGH (ref 4.0–10.5)
nRBC: 1.3 % — ABNORMAL HIGH (ref 0.0–0.2)

## 2020-08-28 LAB — GLUCOSE, CAPILLARY
Glucose-Capillary: 216 mg/dL — ABNORMAL HIGH (ref 70–99)
Glucose-Capillary: 209 mg/dL — ABNORMAL HIGH (ref 70–99)
Glucose-Capillary: 164 mg/dL — ABNORMAL HIGH (ref 70–99)
Glucose-Capillary: 184 mg/dL — ABNORMAL HIGH (ref 70–99)
Glucose-Capillary: 180 mg/dL — ABNORMAL HIGH (ref 70–99)

## 2020-08-28 LAB — URINALYSIS, COMPLETE (UACMP) WITH MICROSCOPIC
Bilirubin Urine: NEGATIVE
Glucose, UA: NEGATIVE mg/dL
Ketones, ur: NEGATIVE mg/dL
Nitrite: POSITIVE — AB
Protein, ur: 100 mg/dL — AB
RBC / HPF: >50 RBC/hpf — ABNORMAL HIGH (ref 0–5)
Specific Gravity, Urine: 1.019 (ref 1.005–1.030)
WBC, UA: >50 WBC/hpf — ABNORMAL HIGH (ref 0–5)
pH: 5 (ref 5.0–8.0)

## 2020-08-28 LAB — RENAL FUNCTION PANEL
Albumin: 1.8 g/dL — ABNORMAL LOW (ref 3.5–5.0)
Anion gap: 13 (ref 5–15)
BUN: 102 mg/dL — ABNORMAL HIGH (ref 8–23)
CO2: 24 mmol/L (ref 22–32)
Calcium: 8.4 mg/dL — ABNORMAL LOW (ref 8.9–10.3)
Chloride: 109 mmol/L (ref 98–111)
Creatinine, Ser: 4.5 mg/dL — ABNORMAL HIGH (ref 0.44–1.00)
GFR, Estimated: 9 mL/min — ABNORMAL LOW (ref >60)
Glucose, Bld: 239 mg/dL — ABNORMAL HIGH (ref 70–99)
Phosphorus: 3.4 mg/dL (ref 2.5–4.6)
Potassium: 4.1 mmol/L (ref 3.5–5.1)
Sodium: 146 mmol/L — ABNORMAL HIGH (ref 135–145)

## 2020-08-28 MED ORDER — SODIUM CHLORIDE 0.9 % IV SOLN
1.0000 g | INTRAVENOUS | Status: DC
  Filled 2020-08-28: qty 1

## 2020-08-28 MED ORDER — METRONIDAZOLE 0.75 % VA GEL
1.0000 | Freq: Every day | VAGINAL | Status: AC
  Administered 2020-08-28 – 2020-09-01 (×5): 1 via VAGINAL
  Filled 2020-08-28: qty 70

## 2020-08-28 MED ORDER — FREE WATER
250.0000 mL | Status: DC
  Administered 2020-08-28 – 2020-09-01 (×25): 250 mL

## 2020-08-28 MED ORDER — FLUCONAZOLE 100MG IVPB
50.0000 mg | INTRAVENOUS | Status: AC
  Administered 2020-08-29 – 2020-08-31 (×2): 50 mg via INTRAVENOUS
  Filled 2020-08-28 (×2): qty 25

## 2020-08-28 MED ORDER — FLUCONAZOLE 100MG IVPB: 50.0000 mg | INTRAVENOUS | Status: DC

## 2020-08-28 MED ORDER — SODIUM CHLORIDE 0.9 % IV SOLN
1.0000 g | INTRAVENOUS | Status: DC
  Administered 2020-08-28 – 2020-09-01 (×5): 1 g via INTRAVENOUS
  Filled 2020-08-28 (×5): qty 1

## 2020-08-28 NOTE — Progress Notes (Signed)
STROKE TEAM PROGRESS NOTE   INTERVAL HISTORY No family is at bedside.  Patient no significant change from yesterday per nursing, still lethargic but stable. Eyes open, gazes at examiner, tried to speak. Points to her head likely headache. Leukocytosis, fever. Infectious workup. Not hypotensive. Pharmacy consulted for antibiotics. Daughter leaning towards palliative care.   Vitals:   08/28/20 0850 08/28/20 0950 08/28/20 1204 08/28/20 1512  BP: (!) 116/49  (!) 118/54 (!) 132/51  Pulse: 98  81 92  Resp: 20  19 20   Temp: (!) 100.7 F (38.2 C) 98.8 F (37.1 C) 98.5 F (36.9 C) 97.6 F (36.4 C)  TempSrc: Oral Oral Oral Axillary  SpO2: 97%  100% 100%  Weight:      Height:       CBC:  Recent Labs  Lab 08/27/20 0304 08/28/20 0121  WBC 16.9* 19.3*  HGB 7.1* 7.6*  HCT 21.4* 23.1*  MCV 89.9 90.6  PLT 206 263   Basic Metabolic Panel:  Recent Labs  Lab 08/22/20 0541 08/23/20 0215 08/27/20 0304 08/28/20 0121  NA 140   < > 144 146*  K 4.1   < > 4.1 4.1  CL 107   < > 108 109  CO2 20*   < > 22 24  GLUCOSE 131*   < > 167* 239*  BUN 58*   < > 104* 102*  CREATININE 4.43*   < > 5.00* 4.50*  CALCIUM 7.8*   < > 8.3* 8.4*  MG 1.9  --   --   --   PHOS 4.8*   < > 3.4 3.4   < > = values in this interval not displayed.   Lipid Panel:  Recent Labs  Lab 08/23/20 0215  TRIG 73   HgbA1c:  No results for input(s): HGBA1C in the last 168 hours. Urine Drug Screen:  No results for input(s): LABOPIA, COCAINSCRNUR, LABBENZ, AMPHETMU, THCU, LABBARB in the last 168 hours.  Alcohol Level  No results for input(s): ETH in the last 168 hours.  IMAGING past 24 hours DG Chest Port 1 View  Result Date: 08/28/2020 CLINICAL DATA:  Fever.  Diabetes. EXAM: PORTABLE CHEST 1 VIEW COMPARISON:  August 18, 2020 FINDINGS: The feeding tube terminates below today's film. No pneumothorax. Mild opacity in the right base may be atelectasis. More significant opacity in the left retrocardiac region is identified  as well, indeterminate. No other acute abnormalities. IMPRESSION: Opacity in the right base may be atelectasis. More significant opacity in the left retrocardiac region is indeterminate and could represent atelectasis or pneumonia. Recommend clinical correlation and short-term follow-up imaging to ensure resolution. Electronically Signed   By: Dorise Bullion III M.D   On: 08/28/2020 12:17     PHYSICAL EXAM   Temp:  [97.6 F (36.4 C)-100.7 F (38.2 C)] 97.6 F (36.4 C) (12/19 1512) Pulse Rate:  [81-99] 92 (12/19 1512) Resp:  [19-23] 20 (12/19 1512) BP: (98-132)/(46-55) 132/51 (12/19 1512) SpO2:  [97 %-100 %] 100 % (12/19 1512)  General - Well nourished, well developed, not in acute distress, mildly lethargic.  Ophthalmologic - fundi not visualized due to noncooperation.  Cardiovascular - Regular rhythm and rate.  Neuro - eyes open, mildly lethargic and falls back to sleep but easily arousable, still has severe dysarthria, intangible words. Expressive > receptive aphasia. Not able to answer questions, followed midline commands but not peripheral commands. Eyes midline, able to gaze bilaterally, tracking bilaterally, however, blinking to visual threat on the left but not on the  right. Right facial droop, right eye closure weaker than left. Tongue protrusion to the right. LUE at least 3/5 without drift and BLE slight withdraw to pain. RUE flaccid. Bilaterally babinski positive.   ASSESSMENT/PLAN Maria [name] is a 86 y.o. female with history of previous stroke, HTN, HLD, initially admitted for acute deconditioning, gen weakness post anemia/gastritis. She was an in-house code stroke when staff noted a change in mentation. CTA showed Left M2 occlusion for which she had emergent thrombectomy. Had IR - 12/9 5:25 PM - Occluded Lt MCA inf division in the mid M2 seg. Delated partial reconstitution of Lt MCA from Lt ACA collaterals retrogradely. Unsuccessful attempts at  revascularization due to  severe prox  Lt ICA tortuosity and of the aortic arch.  Stroke: L MCA moderate stroke due to left M2 occlusion with unsuccessful IR and in TIMELESS trial, likely cardioembolic etiology  Code Stroke CT head: hyperdensity along the left M2 MCA in the sylvian fissure  CTA head & neck Distal branch of M2 occlusion.   CT perfusion 17ml with missmatch  MRI  Multiple scattered tiny strokes bilat, moderate LMCA stroke, small amt of petechial hemorrhage noted in bed of infarct. Small vessel ischemic disease and old lacunar strokes noted.   Repeat CTA unchanged left M2 occlusion  2D Echo -08/11/2020 - EF > 75%. No cardiac source of emboli identified.   LDL 50  HgbA1c 5.6  UDS - negative  SARS - negative  VTE prophylaxis - post TIMELESS trial, hold for possible IV thrombolytic administration - SCDs  ASA 81mg  (but this was recently put on hold by GI for acute gastritis and anemia) prior to admission, now on ASA 325 mg daily  Therapy recommendations:  SNF  Disposition:  Pending - palliative care on board and pending discussion of [place] - appreciate Palliative Care assistance. Pt remains a full code pending conversations with granddaughter who s leaning towards comfort measures.  Recent GIB with anemia  12/2 admission for syncope, GIB  Found to have severe anemia with Hb 6.9  Received PRBC   EGD done showed NSAID related gastritis and gastric ulcers and Cameron's erosions. Per GI at that time, hold ASA for2 weeks from admission (restart 08/24/20), continue BID PPI for 4 months, start PO iron in 1 week.  This admission Hb 10.2->...->7.5->8.2->8.5->8.5->8.1->7.9->7.9->7.1->7.6  CBC monitoring  Iron resumed -> receiving iron IV daily now  PPI IV bid  AKI on CKD  Likely d/t contrast nephropathy  Cre 1.12->1.07->1.38->2.54->3.56->4.43->5.11->5.73->5.83->5.28->5.00->4.50 (gradually improving)  On IVF - NS @ 50-> LR @ 50->off   More urine output now  Nephrology onboard,  appreciate help  On TF with Glucerna @ 45 and Free water 100 ml Q 4 hrs  received albumin/lasix challenge 12/14.   BMP monitoring  Trying to avoid dialysis  Hypertension  Home meds:  Toprol XL 25 mg daily, Lasix 40mg    Off cleviprex  Current BP meds - metoprolol 12.5->25->12.5 bid  Stable on the high end . Long-term BP goal 130-150 given left M2 occlusion  Hyperlipidemia  Home meds:  Lipitor 40mg  and Zetia 10mg    Home meds resumed   LDL 50, goal < 70  Continue statin at discharge  Diabetes type II Controlled  Home meds:  Insulin  Now - SSI  HgbA1c 5.6, goal < 7.0  CBG monitoring  Avoid hypoglycemia  Dysphagia   Did not pass swallow or MBS 12/13  Repeat MBS 08/26/2020 - pt to remain NPO  NPO  NGT -> cortrak  On TF @ 72 and FW  Speech on board  Palliative care on board  Other Stroke Risk Factors  Advanced Age >/= 6   Coronary artery disease  Congestive heart failure  Other Active Problems and Findings  Incidental finding to f/u on as out pt: Multiple thyroid nodules, the largest within the left lobe measuring 2.3 cm.   Pt was enrolled in TIMELESS trial prior to thrombectomy.  Small right frontal scalp hematoma   Rt groin hematoma - resolving   Soft stool x 2 on 08/21/20  allopurinol renal dosing  Leukocytosis, febrile, pharmacy consulted for antibiotic dosing, infectious workup ordered. Cefepime started 12/19. Labs in AM. UA c/w UTI - culture pending. Possible pneumonia on CXR.  Labs 12/19 - improved  Hospital day # 10  Palliative care on board. Family leaning towards comfort measures but needs a few days to think.  Personally examined patient and images, and have participated in and made any corrections needed to history, physical, neuro exam,assessment and plan as stated above.  I have personally obtained the history, evaluated lab date, reviewed imaging studies and agree with radiology interpretations.    Sarina Ill,  MD Stroke Neurology  I spent over 45 minutes of face-to-face and non-face-to-face time with patient. This included prechart review, lab review, study review, order entry, electronic health record documentation, patient education on the different diagnostic and therapeutic options, counseling and coordination of care, risks and benefits of management, compliance, or risk factor reduction     To contact Stroke Continuity provider, please refer to [email or web address] After hours, contact General Neurology

## 2020-08-28 NOTE — Progress Notes (Signed)
Palliative Medicine Inpatient Follow Up Note  Reason for consult:  Goals of Care  HPI:  Per Neurology Note --> [name] a 86 y.o.femalewith history of previous stroke, HTN, HLD, initially admitted for acute deconditioning, gen weakness post anemia/gastritis.She was an in-house code stroke when staff noted a change in mentation.CTA showed Left M2 occlusion for which she had emergent thrombectomy. Had IR-12/9 5:25 PM - Occluded Lt MCA inf division in the mid M2 seg. Delated partial reconstitution of Lt MCA from Lt ACA collaterals retrogradely. Unsuccessful attempts at revascularization due to severe prox Lt ICA tortuosity and of the aortic arch.  Today's Discussion (08/28/2020): Chart reviewed.  I met with Maria [name] at bedside this morning.  She remains quite similar to the prior 2 days is not noted to be in any distress.  I was able to call patient's caregiver, Maria [name] this morning she provided me with [name]'s granddaughter Maria [name] phone number which has been added to the context section of the chart.  I then reached out to [place].  He Maria [name] shared with me that she was just on her way home from a shift she is a [employer].  I was able to summarize Maria [name]'s hospitalization and discuss her current status.    We reviewed the idea of placing a PEG tube in [name] and sending her to rehabilitation.  I shared my concerns that she may not rehabilitate well given her present state.  I asked Maria [name] she feels that her grandmother could see herself in this situation would want to be in this state.  She expresses that she does not believe she would find this to be an acceptable quality of life.   I also brought up the topic of CODE STATUS.  I shared that given everything that he has already been through I do not think providing a full resuscitative effort could she go into cardiac arrest would positively impact her.  Rather, I feel if we did everything to maintain  life at this point we could cause more devastating harm than benefit.  Maria [name] was very understanding of all of the above conversation.  She expresses that this is a lot and she was hopeful that she would be able to see [name] before making these decisions.  She request two days to think about things she feels that she is likely heading in a more comfort focused direction but she wants to enable herself some time.  We talked about touching base with one another again on Tuesday afternoon.  Objective Assessment: Vital Signs Vitals:   08/28/20 0850 08/28/20 0950  BP: (!) 116/49   Pulse: 98   Resp: 20   Temp: (!) 100.7 F (38.2 C) 98.8 F (37.1 C)  SpO2: 97%     Intake/Output Summary (Last 24 hours) at 08/28/2020 1052 Last data filed at 08/28/2020 0853 Gross per 24 hour  Intake 540 ml  Output 1350 ml  Net -810 ml   Last Weight  Most recent update: 08/22/2020  5:47 AM   Weight  74 kg (163 lb 2.3 oz)           Gen:  Elderly AA F HEENT: Dry mucous membranes - oral thrush CV: Regular rate and rhythm PULM: clear to auscultation bilaterally. No wheezes/rales/rhonchi ABD: soft/nontender/nondistended/normal bowel sounds EXT: No edema Neuro: Alert  SUMMARY OF RECOMMENDATIONS Full Code / Full Scope of Care for the time being --> wrongly recommended DO NOT RESUSCITATE CODE STATUS  Plan to enable  patient's granddaughter Maria [name] some time to think about her grandmother's present health state.  The palliative care team will reach out on Tuesday afternoon to get a better impression of which direction we are heading.  Ongoing PMT support  Time Spent: 35 Greater than 50% of the time was spent in counseling and coordination of care ______________________________________________________________________________________ [place] Team Team Cell Phone: [phone] Please utilize secure chat with additional questions, if there is no response  within 30 minutes please call the above phone number  Palliative Medicine Team providers are available by phone from 7am to 7pm daily and can be reached through the team cell phone.  Should this patient require assistance outside of these hours, please call the patient's attending physician.

## 2020-08-28 NOTE — Progress Notes (Addendum)
Pharmacy Antibiotic Note  Maria [name] is a 86 y.o. female admitted on 08/17/2020 with leukocytosis, febrile, unknown source of infection.  Pharmacy has been consulted for cefepime dosing at the request of Dr. Jaynee Eagles.  Patient recently had urine culture (12/12) with mult species present, currently has a catheter - ?urinary source.  WBC steadily increasing (9>19), spiked temp this AM (100).  With neg MRSA PCR and possible urinary source of infection, will initiate gram negative coverage for now.  Repeat blood and urine cultures pending. Timing antibiotics for later this afternoon to allow for cultures to be drawn for an accurate clinical picture.  Plan: Cefepime 1g IV q24 hours  F/u blood cultures, urine culture and adjust abx as necessary Monitor clinical improvement, renal function  Height: 4\' 11"  (149.9 cm) Weight: 74 kg (163 lb 2.3 oz) IBW/kg (Calculated) : 43.2  Temp (24hrs), Avg:99 F (37.2 C), Min:98.4 F (36.9 C), Max:100.7 F (38.2 C)  Recent Labs  Lab 08/24/20 0101 08/25/20 0025 08/26/20 0709 08/27/20 0304 08/28/20 0121  WBC 9.5 9.4 14.0* 16.9* 19.3*  CREATININE 5.73* 5.83* 5.28* 5.00* 4.50*    Estimated Creatinine Clearance: 7 mL/min (A) (by C-G formula based on SCr of 4.5 mg/dL (H)).    Allergies  Allergen Reactions  . Ace Inhibitors Other (See Comments)    Angioedema   . Betadine [Povidone Iodine] Itching  . Valsartan Hives and Other (See Comments)    Antimicrobials this admission: Cefepime 12/19 >>   Microbiology results: 12/19 BCx: pending 12/19 UCx: pending  12/12 UCx: mult species present 12/10 MRSA PCR: negative  Thank you for allowing pharmacy to be a part of this patient's care.  Dimple Nanas, PharmD PGY-1 Acute Care Pharmacy Resident Office: [phone] 08/28/2020 11:30 AM

## 2020-08-28 NOTE — Progress Notes (Signed)
Patient ID: Maria [name], female   DOB: 1925/12/01, 86 y.o.   MRN: [id number] [facility] Progress Note   Assessment/ Plan:   1. Acute kidney Injury: This is suspected likely to be from contrast-induced nephropathy given the timeline of events.  Nonoliguric overnight with continued improvement of renal function seen on labs. Euvolemic on exam.  2.  Hypernatremia: Secondary to inadequate water replacement/dehydration and possibly increased insensible losses in the setting of fever.  Increase free water flushes to 250 cc every 4 hours. 2. Hypertension: Blood pressure under satisfactory control on metoprolol monoterapy. 3. 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 4. Anemia: Low Tsat-getting IV Fe and status post Aranesp earlier. 5. [place] metabolic acidosis: Improving with some renal recovery, will stop bicarbonate supplement given current labs.  Recovering from AKI- continue labs surveillance. Nephrology service will sign off at this time and remain available for questions/concerns.   Subjective:   She complains of some headaches overnight/early this morning-better at this time.  Fever noted earlier this morning.    Objective:   BP (!) 116/49 (BP Location: Left Arm)   Pulse 98   Temp (!) 100.7 F (38.2 C) (Oral)   Resp 20   Ht 4\' 11"  (1.499 m)   Wt 74 kg   SpO2 97%   BMI 32.95 kg/m   Intake/Output Summary (Last 24 hours) at 08/28/2020 0932 Last data filed at 08/28/2020 0853 Gross per 24 hour  Intake 540 ml  Output 1350 ml  Net -810 ml   Weight change:   Physical Exam: Gen: Appears comfortable resting in bed, RN at bedside CVS: Pulse regular rhythm, normal rate, S1 and S2 normal Resp: Clear to auscultation bilaterally, no distinct rales or rhonchi Abd: Soft, flat, nontender, bowel sounds normal Ext: No lower extremity edema  noted  Imaging: No results found.  Labs: BMET Recent Labs  Lab 08/21/20 1700 08/22/20 0541 08/23/20 0215 08/24/20 0101 08/25/20 0025 08/26/20 0709 08/27/20 0304 08/28/20 0121  NA  --  140 140 139 141 143 144 146*  K  --  4.1 5.0 4.8 4.7 4.5 4.1 4.1  CL  --  107 109 105 109 108 108 109  CO2  --  20* 20* 21* 21* 20* 22 24  GLUCOSE  --  131* 140* 141* 162* 139* 167* 239*  BUN  --  58* 74* 83* 95* 101* 104* 102*  CREATININE  --  4.43* 5.11* 5.73* 5.83* 5.28* 5.00* 4.50*  CALCIUM  --  7.8* 7.9* 8.0* 8.3* 8.4* 8.3* 8.4*  PHOS 5.5* 4.8*  --  4.2 3.8 3.5 3.4 3.4   CBC Recent Labs  Lab 08/25/20 0025 08/26/20 0709 08/27/20 0304 08/28/20 0121  WBC 9.4 14.0* 16.9* 19.3*  HGB 7.9* 7.9* 7.1* 7.6*  HCT 24.0* 24.3* 21.4* 23.1*  MCV 88.9 89.0 89.9 90.6  PLT 177 194 206 224    Medications:    . [START ON 09/05/2020] allopurinol  50 mg Per Tube Weekly  . aspirin  325 mg Per Tube Daily  . atorvastatin  40 mg Per Tube Daily  . Chlorhexidine Gluconate Cloth  6 each Topical Daily  . darbepoetin (ARANESP) injection - NON-DIALYSIS  60 mcg Subcutaneous Q Sat-1800  . donepezil  10 mg Per Tube QPM  . ezetimibe  10 mg Per Tube QHS  . feeding supplement (PROSource TF)  45 mL Per Tube BID  . folic acid-pyridoxine-cyancobalamin  1  tablet Per Tube Daily  . free water  200 mL Per Tube Q4H  . insulin aspart  0-15 Units Subcutaneous Q4H  . mouth rinse  15 mL Mouth Rinse BID  . metoprolol tartrate  12.5 mg Per Tube BID  . pantoprazole sodium  40 mg Per Tube Daily  . sodium bicarbonate  650 mg Per NG tube TID   Elmarie Shiley, MD 08/28/2020, 9:32 AM

## 2020-08-28 NOTE — Progress Notes (Signed)
Called by RN regarding [employer] event in which rectal tube was found inserted in the patient's vagina earlier today. The tube was repositioned in the patient's rectum. This case was discussed with Gynecology, who recommended Metrogel applied vaginally qhs x 5 doses to manage possible fecal contamination of the vagina. Order placed.   Electronically signed: Dr. Kerney Elbe

## 2020-08-28 NOTE — Progress Notes (Addendum)
PHARMACY NOTE:  ANTIMICROBIAL RENAL DOSAGE ADJUSTMENT  Current antimicrobial regimen includes a mismatch between antimicrobial dosage and estimated renal function.  As per policy approved by the Pharmacy & Therapeutics and Medical Executive Committees, the antimicrobial dosage will be adjusted accordingly.  Current antimicrobial dosage:  Fluconazole 50mg  IV q24 hours  Indication: oral thrush  Renal Function:  Estimated Creatinine Clearance: 7 mL/min (A) (by C-G formula based on SCr of 4.5 mg/dL (H)). []      On intermittent HD, scheduled: []      On CRRT    Antimicrobial dosage has been changed to:  Fluconazole 50mg  IV q48 hours   Additional comments:   Thank you for allowing pharmacy to be a part of this patient's care.  Dimple Nanas, PharmD PGY-1 Acute Care Pharmacy Resident Office: [phone] 08/28/2020 11:53 AM

## 2020-08-29 ENCOUNTER — Telehealth: Payer: Self-pay

## 2020-08-29 LAB — RENAL FUNCTION PANEL
Albumin: 1.6 g/dL — ABNORMAL LOW (ref 3.5–5.0)
Anion gap: 11 (ref 5–15)
BUN: 101 mg/dL — ABNORMAL HIGH (ref 8–23)
CO2: 25 mmol/L (ref 22–32)
Calcium: 8.1 mg/dL — ABNORMAL LOW (ref 8.9–10.3)
Chloride: 111 mmol/L (ref 98–111)
Creatinine, Ser: 3.95 mg/dL — ABNORMAL HIGH (ref 0.44–1.00)
GFR, Estimated: 10 mL/min — ABNORMAL LOW (ref >60)
Glucose, Bld: 210 mg/dL — ABNORMAL HIGH (ref 70–99)
Phosphorus: 3 mg/dL (ref 2.5–4.6)
Potassium: 3.7 mmol/L (ref 3.5–5.1)
Sodium: 147 mmol/L — ABNORMAL HIGH (ref 135–145)

## 2020-08-29 LAB — GLUCOSE, CAPILLARY
Glucose-Capillary: 155 mg/dL — ABNORMAL HIGH (ref 70–99)
Glucose-Capillary: 174 mg/dL — ABNORMAL HIGH (ref 70–99)
Glucose-Capillary: 186 mg/dL — ABNORMAL HIGH (ref 70–99)
Glucose-Capillary: 186 mg/dL — ABNORMAL HIGH (ref 70–99)
Glucose-Capillary: 191 mg/dL — ABNORMAL HIGH (ref 70–99)
Glucose-Capillary: 209 mg/dL — ABNORMAL HIGH (ref 70–99)
Glucose-Capillary: 211 mg/dL — ABNORMAL HIGH (ref 70–99)

## 2020-08-29 LAB — CBC
HCT: 23 % — ABNORMAL LOW (ref 36.0–46.0)
Hemoglobin: 7 g/dL — ABNORMAL LOW (ref 12.0–15.0)
MCH: 28 pg (ref 26.0–34.0)
MCHC: 30.4 g/dL (ref 30.0–36.0)
MCV: 92 fL (ref 80.0–100.0)
Platelets: 234 10*3/uL (ref 150–400)
RBC: 2.5 MIL/uL — ABNORMAL LOW (ref 3.87–5.11)
RDW: 21.3 % — ABNORMAL HIGH (ref 11.5–15.5)
WBC: 14.4 10*3/uL — ABNORMAL HIGH (ref 4.0–10.5)
nRBC: 0.8 % — ABNORMAL HIGH (ref 0.0–0.2)

## 2020-08-29 NOTE — Progress Notes (Signed)
°   08/29/20 0735  Assess: MEWS Score  Temp 99.3 F (37.4 C)  BP 130/62  Pulse Rate (!) 122  ECG Heart Rate (!) 123  Resp 20  Level of Consciousness Alert  SpO2 96 %  O2 Device Room Air  Assess: MEWS Score  MEWS Temp 0  MEWS Systolic 0  MEWS Pulse 2  MEWS RR 0  MEWS LOC 0  MEWS Score 2  MEWS Score Color Yellow  Assess: if the MEWS score is Yellow or Red  Were vital signs taken at a resting state? Yes  Focused Assessment No change from prior assessment  Early Detection of Sepsis Score *See Row Information* Medium  MEWS guidelines implemented *See Row Information* Yes  Treat  MEWS Interventions Escalated (See documentation below)  Pain Scale PAINAD  Breathing 0  Negative Vocalization 0  Facial Expression 0  Body Language 0  Consolability 0  PAINAD Score 0  Take Vital Signs  Increase Vital Sign Frequency  Yellow: Q 2hr X 2 then Q 4hr X 2, if remains yellow, continue Q 4hrs  Escalate  MEWS: Escalate Yellow: discuss with charge nurse/RN and consider discussing with provider and RRT  Notify: Charge Nurse/RN  Name of Charge Nurse/RN Notified Tracey, RN  Date Charge Nurse/RN Notified 08/29/20  Time Charge Nurse/RN Notified 0800  Notify: Provider  Provider Name/Title Dr. Leonie Man  Date Provider Notified 08/29/20  Time Provider Notified 0800  Notification Type Page  Notification Reason Change in status (HR 120's)  Response No new orders

## 2020-08-29 NOTE — Consult Note (Signed)
[place] Nurse Consult Note: Patient receiving care in [facility] [phone] Reason for Consult: sacral and perineal MASD/PI Wound type: MASD/IAD, Stage 2 sacrum Pressure Injury POA: Yes Measurement: See flowsheet Wound bed: Pink red excoriated Drainage (amount, consistency, odor)  Periwound: Red Dressing procedure/placement/frequency: Gently clean groin and sacral area with no rinse cleanser. Pat dry. Continue Gerhardt's butt cream to all affected areas in the groin and sacral area. Clean and apply PRN   Monitor the wound area(s) for worsening of condition such as: Signs/symptoms of infection, increase in size, development of or worsening of odor, development of pain, or increased pain at the affected locations.   Notify the medical team if any of these develop.  Thank you for the consult. [place] nurse will not follow at this time.   Please re-consult the [place] team if needed.  Cathlean Marseilles Tamala Julian, MSN, RN, [place], Lysle Pearl, [facility] Wound Treatment Associate Pager [phone]

## 2020-08-29 NOTE — Telephone Encounter (Signed)
Submitted VOB, SynviscOne, right knee. 

## 2020-08-29 NOTE — Progress Notes (Signed)
STROKE TEAM PROGRESS NOTE   INTERVAL HISTORY No family is at bedside.  Patient no significant change from yesterday per nursing, still lethargic but stable. Eyes open, gazes at examiner, tried to speak.  Patient had rectal tube inadvertently inserted in the vagina and it has since been removed and correctly placed.  She has redness of the vagina and gynecology was consulted over the phone and she has been started on MetroGel.  Neurological exam remains unchanged.  Vital signs are stable.  Still has low-grade fever.  Her white count has come down today to 14.4.  Vitals:   08/29/20 0342 08/29/20 0735 08/29/20 0945 08/29/20 1345  BP: (!) 115/55 130/62 (!) 125/54 (!) 134/54  Pulse: 96 (!) 122 99 96  Resp: 18 20 18 20   Temp: 98.6 F (37 C) 99.3 F (37.4 C) 98.6 F (37 C) 97.7 F (36.5 C)  TempSrc: Axillary Oral Oral Oral  SpO2: 100% 96% 100% 97%  Weight:      Height:       CBC:  Recent Labs  Lab 08/28/20 0121 08/29/20 0254  WBC 19.3* 14.4*  HGB 7.6* 7.0*  HCT 23.1* 23.0*  MCV 90.6 92.0  PLT 224 664   Basic Metabolic Panel:  Recent Labs  Lab 08/28/20 0121 08/29/20 0254  NA 146* 147*  K 4.1 3.7  CL 109 111  CO2 24 25  GLUCOSE 239* 210*  BUN 102* 101*  CREATININE 4.50* 3.95*  CALCIUM 8.4* 8.1*  PHOS 3.4 3.0   Lipid Panel:  Recent Labs  Lab 08/23/20 0215  TRIG 73   HgbA1c:  No results for input(s): HGBA1C in the last 168 hours. Urine Drug Screen:  No results for input(s): LABOPIA, COCAINSCRNUR, LABBENZ, AMPHETMU, THCU, LABBARB in the last 168 hours.  Alcohol Level  No results for input(s): ETH in the last 168 hours.  IMAGING past 24 hours No results found.   PHYSICAL EXAM   Temp:  [97.7 F (36.5 C)-99.3 F (37.4 C)] 97.7 F (36.5 C) (12/20 1345) Pulse Rate:  [91-122] 96 (12/20 1345) Resp:  [18-24] 20 (12/20 1345) BP: (115-137)/(54-64) 134/54 (12/20 1345) SpO2:  [96 %-100 %] 97 % (12/20 1345)  General -elderly African-American lady appears to be  restless.  Ophthalmologic - fundi not visualized due to noncooperation.  Cardiovascular - Regular rhythm and rate.  Neuro - eyes open, mildly lethargic and falls back to sleep but easily arousable, still has severe dysarthria, intangible words. Expressive > receptive aphasia. Not able to answer questions, followed midline commands but not peripheral commands. Eyes midline, able to gaze bilaterally, tracking bilaterally, however, blinking to visual threat on the left but not on the right. Right facial droop, right eye closure weaker than left. Tongue protrusion to the right. LUE at least 3/5 without drift and BLE slight withdraw to pain. RUE flaccid. Bilaterally babinski positive.   ASSESSMENT/PLAN Ms. FIA HEBERT is a 86 y.o. female with history of previous stroke, HTN, HLD, initially admitted for acute deconditioning, gen weakness post anemia/gastritis. She was an in-house code stroke when staff noted a change in mentation. CTA showed Left M2 occlusion for which she had emergent thrombectomy. Had IR - 12/9 5:25 PM - Occluded Lt MCA inf division in the mid M2 seg. Delated partial reconstitution of Lt MCA from Lt ACA collaterals retrogradely. Unsuccessful attempts at  revascularization due to severe prox  Lt ICA tortuosity and of the aortic arch.  Stroke: L MCA moderate stroke due to left M2 occlusion with unsuccessful  IR and in TIMELESS trial, likely cardioembolic etiology  Code Stroke CT head: hyperdensity along the left M2 MCA in the sylvian fissure  CTA head & neck Distal branch of M2 occlusion.   CT perfusion 66ml with missmatch  MRI  Multiple scattered tiny strokes bilat, moderate LMCA stroke, small amt of petechial hemorrhage noted in bed of infarct. Small vessel ischemic disease and old lacunar strokes noted.   Repeat CTA unchanged left M2 occlusion  2D Echo -08/11/2020 - EF > 75%. No cardiac source of emboli identified.   LDL 50  HgbA1c 5.6  UDS - negative  SARS -  negative  VTE prophylaxis - post TIMELESS trial, hold for possible IV thrombolytic administration - SCDs  ASA 81mg  (but this was recently put on hold by GI for acute gastritis and anemia) prior to admission, now on ASA 325 mg daily  Therapy recommendations:  SNF  Disposition:  Pending - palliative care on board and pending discussion of [place] - appreciate Palliative Care assistance. Pt remains a full code pending conversations with granddaughter who s leaning towards comfort measures.  Recent GIB with anemia  12/2 admission for syncope, GIB  Found to have severe anemia with Hb 6.9  Received PRBC   EGD done showed NSAID related gastritis and gastric ulcers and Cameron's erosions. Per GI at that time, hold ASA for2 weeks from admission (restart 08/24/20), continue BID PPI for 4 months, start PO iron in 1 week.  This admission Hb 10.2->...->7.5->8.2->8.5->8.5->8.1->7.9->7.9->7.1->7.6  CBC monitoring  Iron resumed -> receiving iron IV daily now  PPI IV bid  AKI on CKD  Likely d/t contrast nephropathy  Cre 1.12->1.07->1.38->2.54->3.56->4.43->5.11->5.73->5.83->5.28->5.00->4.50 (gradually improving)  On IVF - NS @ 50-> LR @ 50->off   More urine output now  Nephrology onboard, appreciate help  On TF with Glucerna @ 45 and Free water 100 ml Q 4 hrs  received albumin/lasix challenge 12/14.   BMP monitoring  Trying to avoid dialysis  Hypertension  Home meds:  Toprol XL 25 mg daily, Lasix 40mg    Off cleviprex  Current BP meds - metoprolol 12.5->25->12.5 bid  Stable on the high end . Long-term BP goal 130-150 given left M2 occlusion  Hyperlipidemia  Home meds:  Lipitor 40mg  and Zetia 10mg    Home meds resumed   LDL 50, goal < 70  Continue statin at discharge  Diabetes type II Controlled  Home meds:  Insulin  Now - SSI  HgbA1c 5.6, goal < 7.0  CBG monitoring  Avoid hypoglycemia  Dysphagia   Did not pass swallow or MBS 12/13  Repeat MBS 08/26/2020 -  pt to remain NPO  NPO  NGT -> cortrak  On TF @ 57 and FW  Speech on board  Palliative care on board  Other Stroke Risk Factors  Advanced Age >/= 65   Coronary artery disease  Congestive heart failure  Other Active Problems and Findings  Incidental finding to f/u on as out pt: Multiple thyroid nodules, the largest within the left lobe measuring 2.3 cm.   Pt was enrolled in TIMELESS trial prior to thrombectomy.  Small right frontal scalp hematoma   Rt groin hematoma - resolving   Soft stool x 2 on 08/21/20  allopurinol renal dosing  Leukocytosis, febrile, pharmacy consulted for antibiotic dosing, infectious workup ordered. Cefepime started 12/19. Labs in AM. UA c/w UTI - culture pending. Possible pneumonia on CXR.  Labs 12/19 - improved  Hospital day # 11  Palliative care on board. Family leaning towards comfort  measures but needs a few days to think.  Continue MetroGel application to the vagina.  Foley catheter replaced today because it was leaking.  Will discuss with family goals of care.     I spent over 25 minutes of face-to-face and non-face-to-face time with patient. This included prechart review, lab review, study review, order entry, electronic health record documentation, patient education on the different diagnostic and therapeutic options, counseling and coordination of care, risks and benefits of management, compliance, or risk factor reduction  Antony Contras, MD   To contact Stroke Continuity provider, please refer to [email or web address] After hours, contact General Neurology

## 2020-08-29 NOTE — Care Management Important Message (Signed)
Important Message  Patient Details  Name: Maria [name] MRN: [id number] Date of Birth: 06-Aug-1926   Medicare Important Message Given:  Yes     Thresia Ramanathan 08/29/2020, 4:10 PM

## 2020-08-29 NOTE — Progress Notes (Signed)
Physical Therapy Treatment Patient Details Name: Maria [name] MRN: [id number] DOB: 1926/06/19 Today's Date: 08/29/2020    History of Present Illness 86 yo female back to ED with inability to ambulate, weakness, pain. Hx of severe OA R knee, DM, CKD, HF, OSA, syncope (she discharged home from [facility] on 12/7 as she refused SNF placement, but returned that pm due to inability to care for self.  While waiting in ED for placement on 12/9 she was noted to have Rt sided weakness, and decreased ability to communicate. CTA showed M2 occlusion.  Attempt at revascularization was unsuccessful.    PT Comments    Patient easily aroused and attempting to turn her head to the left with PT on her left side (limited by decreased neck AROM due to left gaze preference). No active movement noted in RUE; slight rt hip internal rotation (repeated x 3 reps). Pt with difficulty following verbal, visual or tactile cues for ROM/exercises for left UE/LE. PROM into rt cervical rotation and lateral flexion. Noted family is considering comfort care.     Follow Up Recommendations  SNF     Equipment Recommendations  None recommended by PT    Recommendations for Other Services       Precautions / Restrictions Precautions Precautions: Fall Precaution Comments: h/o syncope during past hospitalization, NG tube    Mobility  Bed Mobility                  Transfers                    Ambulation/Gait                 Stairs             Wheelchair Mobility    Modified Rankin (Stroke Patients Only) Modified Rankin (Stroke Patients Only) Pre-Morbid Rankin Score: Moderately severe disability Modified Rankin: Severe disability     Balance                                            Cognition Arousal/Alertness: Lethargic Behavior During Therapy: Flat affect Overall Cognitive Status: Difficult to assess                                         Exercises Other Exercises Other Exercises: PROM/AAROM x 4 extremities and neck with pt easily awakened, but drifting off to sleep at times. RUE very edematous and propped on pillow    General Comments        Pertinent Vitals/Pain Pain Assessment: Faces Faces Pain Scale: No hurt    Home Living                      Prior Function            PT Goals (current goals can now be found in the care plan section) Acute Rehab PT Goals Patient Stated Goal: to get better Time For Goal Achievement: 08/31/20 Potential to Achieve Goals: Fair Progress towards PT goals: Not progressing toward goals - comment    Frequency    Min 2X/week      PT Plan Current plan remains appropriate;Other (comment) (family still considering comfort care)    Co-evaluation  AM-PAC PT "6 Clicks" Mobility   Outcome Measure  Help needed turning from your back to your side while in a flat bed without using bedrails?: A Lot Help needed moving from lying on your back to sitting on the side of a flat bed without using bedrails?: A Lot Help needed moving to and from a bed to a chair (including a wheelchair)?: Total Help needed standing up from a chair using your arms (e.g., wheelchair or bedside chair)?: Total Help needed to walk in hospital room?: Total Help needed climbing 3-5 steps with a railing? : Total 6 Click Score: 8    End of Session   Activity Tolerance: Patient tolerated treatment well Patient left: in bed;with call bell/phone within reach;with bed alarm set Nurse Communication: Mobility status;Other (comment) (periods of rapid breathing followed by 10-12 seconds of apnea) PT Visit Diagnosis: Other abnormalities of gait and mobility (R26.89);Muscle weakness (generalized) (M62.81);Difficulty in walking, not elsewhere classified (R26.2)     Time: 8099-8338 PT Time Calculation (min) (ACUTE ONLY): 16 min  Charges:  $Therapeutic Exercise: 8-22 mins                       Arby Barrette, PT Pager [phone]    Rexanne Mano 08/29/2020, 4:57 PM

## 2020-08-30 DIAGNOSIS — R1310 Dysphagia, unspecified: Secondary | ICD-10-CM

## 2020-08-30 DIAGNOSIS — N9089 Other specified noninflammatory disorders of vulva and perineum: Secondary | ICD-10-CM

## 2020-08-30 DIAGNOSIS — Z66 Do not resuscitate: Secondary | ICD-10-CM

## 2020-08-30 LAB — URINE CULTURE
Culture: >=100000 — AB
Special Requests: NORMAL

## 2020-08-30 LAB — COMPREHENSIVE METABOLIC PANEL
ALT: 106 U/L — ABNORMAL HIGH (ref 0–44)
AST: 125 U/L — ABNORMAL HIGH (ref 15–41)
Albumin: 1.7 g/dL — ABNORMAL LOW (ref 3.5–5.0)
Alkaline Phosphatase: 86 U/L (ref 38–126)
Anion gap: 13 (ref 5–15)
BUN: 95 mg/dL — ABNORMAL HIGH (ref 8–23)
CO2: 23 mmol/L (ref 22–32)
Calcium: 7.9 mg/dL — ABNORMAL LOW (ref 8.9–10.3)
Chloride: 108 mmol/L (ref 98–111)
Creatinine, Ser: 3.46 mg/dL — ABNORMAL HIGH (ref 0.44–1.00)
GFR, Estimated: 12 mL/min — ABNORMAL LOW (ref >60)
Glucose, Bld: 210 mg/dL — ABNORMAL HIGH (ref 70–99)
Potassium: 3.7 mmol/L (ref 3.5–5.1)
Sodium: 144 mmol/L (ref 135–145)
Total Bilirubin: 0.6 mg/dL (ref 0.3–1.2)
Total Protein: 4.6 g/dL — ABNORMAL LOW (ref 6.5–8.1)

## 2020-08-30 LAB — GLUCOSE, CAPILLARY
Glucose-Capillary: 184 mg/dL — ABNORMAL HIGH (ref 70–99)
Glucose-Capillary: 210 mg/dL — ABNORMAL HIGH (ref 70–99)
Glucose-Capillary: 173 mg/dL — ABNORMAL HIGH (ref 70–99)
Glucose-Capillary: 201 mg/dL — ABNORMAL HIGH (ref 70–99)
Glucose-Capillary: 201 mg/dL — ABNORMAL HIGH (ref 70–99)

## 2020-08-30 MED ORDER — VALACYCLOVIR HCL 500 MG PO TABS
500.0000 mg | ORAL_TABLET | Freq: Two times a day (BID) | ORAL | Status: DC
  Administered 2020-08-30 (×2): 500 mg
  Filled 2020-08-30 (×2): qty 1

## 2020-08-30 NOTE — Plan of Care (Signed)
°  Problem: Coping: Goal: Will verbalize positive feelings about self Outcome: Progressing Goal: Will identify appropriate support needs Outcome: Progressing   Problem: Health Behavior/Discharge Planning: Goal: Ability to manage health-related needs will improve Outcome: Progressing   Problem: Self-Care: Goal: Ability to participate in self-care as condition permits will improve Outcome: Progressing   Problem: Nutrition: Goal: Risk of aspiration will decrease Outcome: Progressing Goal: Dietary intake will improve Outcome: Progressing   Problem: Ischemic Stroke/TIA Tissue Perfusion: Goal: Complications of ischemic stroke/TIA will be minimized Outcome: Progressing   Problem: Self-Care: Goal: Verbalization of feelings and concerns over difficulty with self-care will improve Outcome: Not Progressing Goal: Ability to communicate needs accurately will improve Outcome: Not Progressing

## 2020-08-30 NOTE — Consult Note (Signed)
Reason for Consult:Labial swelling and lesions Referring Physician: [name] is an 86 y.o. female who is limited in her ability to communicate due to recent CVA. She was admitted on 12/921 with CVA. History is limited to nursing only. No family members present at bedside.  Labial swelling and sores were noted 2-3 days ago and appear to have progressed per nursing. They are unaware of any sexual history.   PMH and PSH: as noted in admitting H & P.   No LMP recorded. Patient has had a hysterectomy.    Past Medical History:  Diagnosis Date  . Anxiety   . Arthritis   . Blood transfusion   . Depression   . Diabetes mellitus   . Edema   . Heart murmur   . Hyperlipidemia   . Hypertension   . Sleep apnea    cpap  . Stroke ([place])    3 x tias  . TIA (transient ischemic attack)     Past Surgical History:  Procedure Laterality Date  . ABDOMINAL HYSTERECTOMY    . APPENDECTOMY    . BIOPSY  08/13/2020   Procedure: BIOPSY;  Surgeon: Rush Landmark Telford Nab., MD;  Location: [facility];  Service: Gastroenterology;;  . ESOPHAGOGASTRODUODENOSCOPY (EGD) WITH PROPOFOL N/A 08/13/2020   Procedure: ESOPHAGOGASTRODUODENOSCOPY (EGD) WITH PROPOFOL;  Surgeon: Irving Copas., MD;  Location: [facility];  Service: Gastroenterology;  Laterality: N/A;  . HEMOSTASIS CLIP PLACEMENT  08/13/2020   Procedure: HEMOSTASIS CLIP PLACEMENT;  Surgeon: Irving Copas., MD;  Location: [facility];  Service: Gastroenterology;;  . IR ANGIO VERTEBRAL SEL VERTEBRAL UNI L MOD SED  08/17/2020  . IR PERCUTANEOUS ART THROMBECTOMY/INFUSION INTRACRANIAL INC DIAG ANGIO  09/08/2020  . NM MYOVIEW LTD  [id number]   post stress left ventricle is normal in size, ejection fraction is 65%, normal myocardial perfusion study, low risk scan  . PARTIAL HYSTERECTOMY    . RADIOLOGY WITH ANESTHESIA N/A 09/06/2020   Procedure: IR WITH ANESTHESIA - CODE STROKE;  Surgeon: Luanne Bras, MD;  Location: [facility];  Service: Radiology;  Laterality: N/A;  . TRANSTHORACIC ECHOCARDIOGRAM  [id number]    Family History  Problem Relation Age of Onset  . Healthy Mother   . Prostate cancer Father   . Cervical cancer Granddaughter   . Heart Problems Son   . Prostate cancer Son   . Neuropathy Neg Hx     Social History:  reports that she has never smoked. She has never used smokeless tobacco. She reports that she does not drink alcohol and does not use drugs.  Allergies:  Allergies  Allergen Reactions  . Ace Inhibitors Other (See Comments)    Angioedema   . Betadine [Povidone Iodine] Itching  . Valsartan Hives and Other (See Comments)    Medications: I have reviewed the patient's current medications.  Review of Systems  Unable to perform ROS: Patient nonverbal    Blood pressure (!) 128/59, pulse 97, temperature 99.5 F (37.5 C), temperature source Axillary, resp. rate 19, height 4\' 11"  (1.499 m), weight 74 kg, SpO2 96 %. Physical Exam Genitourinary:    Comments: Right labial appears swollen in comparison to the left, some erythema note as well, various sized ulcerative lesions on the labial and perineal body, does not appear to extend into the vaginal but exam limited due to pt comfort and in bed, foley and rectal tube in place. Wound and HSV cultures obtained    Results for orders placed or performed during the  hospital encounter of 08/20/2020 (from the past 48 hour(s))  Glucose, capillary     Status: Abnormal   Collection Time: 08/28/20  4:14 PM  Result Value Ref Range   Glucose-Capillary 184 (H) 70 - 99 mg/dL    Comment: Glucose reference range applies only to samples taken after fasting for at least 8 hours.  Glucose, capillary     Status: Abnormal   Collection Time: 08/28/20  7:59 PM  Result Value Ref Range   Glucose-Capillary 180 (H) 70 - 99 mg/dL    Comment: Glucose reference range applies only to samples taken after fasting for at least 8 hours.   Comment 1 Notify RN    Comment 2  Document in Chart   Glucose, capillary     Status: Abnormal   Collection Time: 08/29/20 12:23 AM  Result Value Ref Range   Glucose-Capillary 209 (H) 70 - 99 mg/dL    Comment: Glucose reference range applies only to samples taken after fasting for at least 8 hours.   Comment 1 Notify RN    Comment 2 Document in Chart   Renal function panel     Status: Abnormal   Collection Time: 08/29/20  2:54 AM  Result Value Ref Range   Sodium 147 (H) 135 - 145 mmol/L   Potassium 3.7 3.5 - 5.1 mmol/L   Chloride 111 98 - 111 mmol/L   CO2 25 22 - 32 mmol/L   Glucose, Bld 210 (H) 70 - 99 mg/dL    Comment: Glucose reference range applies only to samples taken after fasting for at least 8 hours.   BUN 101 (H) 8 - 23 mg/dL   Creatinine, Ser 3.95 (H) 0.44 - 1.00 mg/dL   Calcium 8.1 (L) 8.9 - 10.3 mg/dL   Phosphorus 3.0 2.5 - 4.6 mg/dL   Albumin 1.6 (L) 3.5 - 5.0 g/dL   GFR, Estimated 10 (L) >60 mL/min    Comment: (NOTE) Calculated using the CKD-EPI Creatinine Equation (2021)    Anion gap 11 5 - 15    Comment: Performed at [facility] [address], [place], [place] [address]  CBC     Status: Abnormal   Collection Time: 08/29/20  2:54 AM  Result Value Ref Range   WBC 14.4 (H) 4.0 - 10.5 K/uL   RBC 2.50 (L) 3.87 - 5.11 MIL/uL   Hemoglobin 7.0 (L) 12.0 - 15.0 g/dL   HCT 23.0 (L) 36.0 - 46.0 %   MCV 92.0 80.0 - 100.0 fL   MCH 28.0 26.0 - 34.0 pg   MCHC 30.4 30.0 - 36.0 g/dL   RDW 21.3 (H) 11.5 - 15.5 %   Platelets 234 150 - 400 K/uL   nRBC 0.8 (H) 0.0 - 0.2 %    Comment: Performed at [facility], [place] [address], [place], [place] [address]  Glucose, capillary     Status: Abnormal   Collection Time: 08/29/20  4:14 AM  Result Value Ref Range   Glucose-Capillary 211 (H) 70 - 99 mg/dL    Comment: Glucose reference range applies only to samples taken after fasting for at least 8 hours.   Comment 1 Notify RN    Comment 2 Document in Chart   Glucose, capillary     Status: Abnormal    Collection Time: 08/29/20  7:56 AM  Result Value Ref Range   Glucose-Capillary 186 (H) 70 - 99 mg/dL    Comment: Glucose reference range applies only to samples taken after fasting  for at least 8 hours.  Glucose, capillary     Status: Abnormal   Collection Time: 08/29/20 11:36 AM  Result Value Ref Range   Glucose-Capillary 186 (H) 70 - 99 mg/dL    Comment: Glucose reference range applies only to samples taken after fasting for at least 8 hours.  Glucose, capillary     Status: Abnormal   Collection Time: 08/29/20  3:50 PM  Result Value Ref Range   Glucose-Capillary 174 (H) 70 - 99 mg/dL    Comment: Glucose reference range applies only to samples taken after fasting for at least 8 hours.  Glucose, capillary     Status: Abnormal   Collection Time: 08/29/20  8:44 PM  Result Value Ref Range   Glucose-Capillary 191 (H) 70 - 99 mg/dL    Comment: Glucose reference range applies only to samples taken after fasting for at least 8 hours.  Glucose, capillary     Status: Abnormal   Collection Time: 08/29/20 11:47 PM  Result Value Ref Range   Glucose-Capillary 155 (H) 70 - 99 mg/dL    Comment: Glucose reference range applies only to samples taken after fasting for at least 8 hours.  Glucose, capillary     Status: Abnormal   Collection Time: 08/30/20  3:47 AM  Result Value Ref Range   Glucose-Capillary 184 (H) 70 - 99 mg/dL    Comment: Glucose reference range applies only to samples taken after fasting for at least 8 hours.  Glucose, capillary     Status: Abnormal   Collection Time: 08/30/20  7:24 AM  Result Value Ref Range   Glucose-Capillary 210 (H) 70 - 99 mg/dL    Comment: Glucose reference range applies only to samples taken after fasting for at least 8 hours.  Glucose, capillary     Status: Abnormal   Collection Time: 08/30/20 11:50 AM  Result Value Ref Range   Glucose-Capillary 173 (H) 70 - 99 mg/dL    Comment: Glucose reference range applies only to samples taken after fasting for at  least 8 hours.    No results found.  Assessment/Plan: Labial swelling and ulcerative lesions.   Difficult to determine etiology due to limited history. Be that as it may. HSV and wound cultures were obtained. Will start Valtrex to see if this will help until cultures return. Recommend keeping pt's genital area as dry as possible, understand that this is near to impossible due to pt's current health condition. Wound care nurse may be able to add recommendations.  Will follow up on cultures and response to Valtrex.  On behave of [place], I want to thank Dr Mamie Nick and his team for allowing [name] to participate in the care of Ms Phillis. Please feel free to contact us for any questions.  [place] all other times Please do not share contact information with patients or family members.    Chancy Milroy 08/30/2020

## 2020-08-30 NOTE — Consult Note (Signed)
WOC Nurse Consult Note: Patient receiving care in [facility] [phone].  Primary RN, who was her nurse yesterday as well, present during my assessment of the labia. Reason for Consult: worsening labial ulcers Wound type: unknown Pressure Injury POA: Yes/No/NA Measurement: basically the entire right labia is covered in ulcerations, there appears to be drainage from the vagina, and the "urine" in the foley cath tubing looks like pink tinged pus.  I have sent a Secure Chat to doctor Royal Hawthorn explaining he may want to ask a gynecology service to see the patient.  Wound bed: grey-yellow-ish Drainage (amount, consistency, odor) heavy Periwound: edematous right labia majora Dressing procedure/placement/frequency: NA These areas do not appear consistent with ulcerations from fecal and/or urinary incontinence.  A flexiseal is currently in use. Thank you for the consult.  Discussed plan of care with the bedside nurse.  [place] nurse will not follow at this time.  Please re-consult the [facility] team if needed.  Val Riles, RN, MSN, CWOCN, CNS-BC, pager [phone]

## 2020-08-30 NOTE — Progress Notes (Signed)
SLP Cancellation Note  Patient Details Name: Maria [name] MRN: [id number] DOB: Aug 23, 1926   Cancelled treatment:       Reason Eval/Treat Not Completed: Other (comment) (nursing care, SLP will continue to follow along as pt is available to participate in treatment)   Sherman Lipuma, Selinda Orion 08/30/2020, 11:56 AM

## 2020-08-30 NOTE — Progress Notes (Signed)
Occupational Therapy Treatment Patient Details Name: Maria [name] MRN: [id number] DOB: Jan 10, 1926 Today's Date: 08/30/2020    History of present illness 86 yo female back to ED with inability to ambulate, weakness, pain. Hx of severe OA R knee, DM, CKD, HF, OSA, syncope (she discharged home from [facility] on 12/7 as she refused SNF placement, but returned that pm due to inability to care for self.  While waiting in ED for placement on 12/9 she was noted to have Rt sided weakness, and decreased ability to communicate. CTA showed M2 occlusion.  Attempt at revascularization was unsuccessful. Pt with 12/19 peri area wound with WOC consult   OT comments  Pt currently very lethargic and tolerates PROM of all extremities with extension noted at hips and unable to provided PROM. Pt sleeping throughout session with name call and position change of hOB used for arousal. Session limited to patients tolerance and arousal. Goals down graded and updated at this time. Recommendation palliative pending updated/ SNF.    Follow Up Recommendations  SNF    Equipment Recommendations  None recommended by OT    Recommendations for Other Services      Precautions / Restrictions Precautions Precautions: Fall Precaution Comments: NG tube, foley, flexiseal       Mobility Bed Mobility               General bed mobility comments: HOB elevated with same level of arousal  Transfers                      Balance                                           ADL either performed or assessed with clinical judgement   ADL Overall ADL's : Needs assistance/impaired Eating/Feeding: NPO   Grooming: Total assistance                                 General ADL Comments: pt with limited arousal. pt opening eyes and then falling back to sleep. pt tolerated movement without full arousal     Vision       Perception     Praxis      Cognition Arousal/Alertness:  Lethargic Behavior During Therapy: Flat affect Overall Cognitive Status: Difficult to assess                                          Exercises Exercises: Other exercises Other Exercises Other Exercises: PROM R UE - shoulder flexion hand wrist and digits Other Exercises: PROM L UE - shoulder flexion hand wrist and digits Other Exercises: PROM ankle Other Exercises: PROM knee   Shoulder Instructions       General Comments      Pertinent Vitals/ Pain       Pain Assessment: No/denies pain  Home Living                                          Prior Functioning/Environment              Frequency  Min 2X/week  Progress Toward Goals  OT Goals(current goals can now be found in the care plan section)  Progress towards OT goals: Not progressing toward goals - comment  Acute Rehab OT Goals Patient Stated Goal: none stated OT Goal Formulation: Patient unable to participate in goal setting Time For Goal Achievement: 09/02/20 Potential to Achieve Goals: Good ADL Goals Pt Will Perform Grooming: with max assist;bed level Pt Will Perform Upper Body Bathing: with max assist;bed level Pt Will Perform Lower Body Dressing:  (d/c) Pt Will Transfer to Toilet: with max assist;stand pivot transfer;bedside commode;with +2 assist Pt Will Perform Toileting - Clothing Manipulation and hygiene: sit to/from stand;with 2+ total assist;with max assist Additional ADL Goal #1: Pt will maintain EOB sitting with mod A x 5 mins in prep for ADLs and functional transfers Additional ADL Goal #2: Pt will use Rt UE as a support with max facilitation  Plan Discharge plan needs to be updated;Frequency needs to be updated    Co-evaluation                 AM-PAC OT "6 Clicks" Daily Activity     Outcome Measure   Help from another person eating meals?: Total Help from another person taking care of personal grooming?: Total Help from another person  toileting, which includes using toliet, bedpan, or urinal?: Total Help from another person bathing (including washing, rinsing, drying)?: Total Help from another person to put on and taking off regular upper body clothing?: Total Help from another person to put on and taking off regular lower body clothing?: Total 6 Click Score: 6    End of Session    OT Visit Diagnosis: Hemiplegia and hemiparesis Hemiplegia - Right/Left: Right Hemiplegia - dominant/non-dominant: Dominant Hemiplegia - caused by: Cerebral infarction Pain - Right/Left: Right Pain - part of body: Knee   Activity Tolerance Patient limited by lethargy   Patient Left in bed;with call bell/phone within reach;with bed alarm set;with SCD's reapplied   Nurse Communication Mobility status;Precautions;Need for lift equipment        Time: 1447-1500 OT Time Calculation (min): 13 min  Charges: OT General Charges $OT Visit: 1 Visit OT Treatments $Therapeutic Exercise: 8-22 mins   Maria [name], OTR/L  Acute Rehabilitation Services Pager: [phone] Office: [phone] .    Maria [name] 08/30/2020, 3:10 PM

## 2020-08-30 NOTE — Progress Notes (Signed)
Palliative:  HPI: 86 y.o.femalewith history of previous stroke, HTN, HLD, initially admitted for acute deconditioning, gen weakness post anemia/gastritis.She was an in-house code stroke when staff noted a change in mentation.CTA showed Left M2 occlusion for which she had emergent thrombectomy. Continues with poor neurological status and severe dysphagia.   I met today at Maria [name]'s bedside. She is alert and makes attempts at speech but very poor control of tongue and speech is garbled and cannot understand. She smiles and appears in good spirits and without discomfort. Provided much needed oral care and washed her face and she seemed pleased. I also turned the television on and she seemed content.   I called and spoke with granddaughter, Maria [name]. Maria [name] shares with me that she has made decision for DNR and that her grandmother has had a good long life. Maria [name] plans to come down this weekend and would like to continue current care until she can arrive to be with her grandmother and let her know that family will be okay if she needs to go. She would like to continue feeding until she is able to arrive. She is open to hospice placement and does not want long term feeding tube. If hospice facility is willing to continue tube feeding until Christmas when family able to come to bedside Maria [name] agrees with pursuing hospice placement.   All questions/concerns addressed. Emotional support provided.   Exam: Alert, unable to assess orientation due to aphasia. Able to track and makes attempts at verbal responses although unable to understand. No distress. Breathing regular, unlabored. Cortrak in place. Abd flat, soft. GYN to come assess labial swelling and sores - I did not assess today.   Plan: - DNR decided. - Continue Cortrak feeding until Christmas when family able to arrive from out of town.  - Family open to hospice facility placement.   [place], NP Palliative Medicine Team Pager  [phone] (Please see [email or web address] for schedule) Team Phone [phone]    Greater than 50%  of this time was spent counseling and coordinating care related to the above assessment and plan

## 2020-08-30 NOTE — Progress Notes (Addendum)
STROKE TEAM PROGRESS NOTE   INTERVAL HISTORY No family is at bedside.  Patient no significant change from yesterday per nursing. Patient had rectal tube inadvertently inserted in the vagina and it has since been removed and correctly placed.  She has redness of the vagina and gynecology was consulted over the phone and she has been started on MetroGel. Formal consult of GYN as labia is more swollen and open weeping wounds. WOCN also consulted. Neurological exam remains unchanged. UOP is on the lower side of normal; and is pink tinged with sediment. Kidney function is diminished. Creat: 3.95 Bun: 101. Palliative care to reach out to family again today per last note. May need to involve nephrology.  Vitals:   08/29/20 2343 08/30/20 0130 08/30/20 0415 08/30/20 0718  BP: (!) 131/57 (!) 124/52 124/64 (!) 128/59  Pulse: 94 94  97  Resp: 20  18 19   Temp: 98 F (36.7 C)  98.8 F (37.1 C) 99.5 F (37.5 C)  TempSrc: Oral  Oral Axillary  SpO2: 99%   96%  Weight:      Height:       CBC:  Recent Labs  Lab 08/28/20 0121 08/29/20 0254  WBC 19.3* 14.4*  HGB 7.6* 7.0*  HCT 23.1* 23.0*  MCV 90.6 92.0  PLT 224 161   Basic Metabolic Panel:  Recent Labs  Lab 08/28/20 0121 08/29/20 0254  NA 146* 147*  K 4.1 3.7  CL 109 111  CO2 24 25  GLUCOSE 239* 210*  BUN 102* 101*  CREATININE 4.50* 3.95*  CALCIUM 8.4* 8.1*  PHOS 3.4 3.0   IMAGING past 24 hours No results found.   PHYSICAL EXAM   Temp:  [97.7 F (36.5 C)-99.5 F (37.5 C)] 99.5 F (37.5 C) (12/21 0718) Pulse Rate:  [92-101] 97 (12/21 0718) Resp:  [18-20] 19 (12/21 0718) BP: (91-134)/(48-64) 128/59 (12/21 0718) SpO2:  [96 %-100 %] 96 % (12/21 0718)  General -elderly African-American lady appears to be restless.  Ophthalmologic - PERRL  Cardiovascular - Regular rhythm and rate.  Neuro - eyes open, mildly lethargic and falls back to sleep but easily arousable, still has severe dysarthria, intangible words. Expressive >  receptive aphasia. Not able to answer questions, followed midline commands but not peripheral commands. Eyes midline, able to gaze bilaterally, tracking bilaterally, however, blinking to visual threat on the left but not on the right. Right facial droop, right eye closure weaker than left. Tongue protrusion to the right. LUE at least 3/5 without drift and BLE slight withdraw to pain. RUE flaccid. Bilaterally babinski positive.   ASSESSMENT/PLAN Maria [name] is a 86 y.o. female with history of previous stroke, HTN, HLD, initially admitted for acute deconditioning, gen weakness post anemia/gastritis. She was an in-house code stroke when staff noted a change in mentation. CTA showed Left M2 occlusion for which she had emergent thrombectomy. Had IR - 12/9 5:25 PM - Occluded Lt MCA inf division in the mid M2 seg. Delated partial reconstitution of Lt MCA from Lt ACA collaterals retrogradely. Unsuccessful attempts at  revascularization due to severe prox  Lt ICA tortuosity and of the aortic arch.  Stroke: L MCA moderate stroke due to left M2 occlusion with unsuccessful IR and in TIMELESS trial, likely cardioembolic etiology  Code Stroke CT head: hyperdensity along the left M2 MCA in the sylvian fissure  CTA head & neck Distal branch of M2 occlusion.   CT perfusion 34ml with missmatch  MRI  Multiple scattered tiny strokes bilat,  moderate LMCA stroke, small amt of petechial hemorrhage noted in bed of infarct. Small vessel ischemic disease and old lacunar strokes noted.   Repeat CTA unchanged left M2 occlusion  2D Echo -08/11/2020 - EF > 75%. No cardiac source of emboli identified.   LDL 50  HgbA1c 5.6  UDS - negative  SARS - negative  VTE prophylaxis - post TIMELESS trial, hold for possible IV thrombolytic administration - SCDs  ASA 81mg  (but this was recently put on hold by GI for acute gastritis and anemia) prior to admission, now on ASA 325 mg daily  Therapy recommendations:   SNF  Disposition:  Pending - palliative care on board and pending discussion of [place] - appreciate Palliative Care assistance. Pt remains a full code pending conversations with granddaughter who s leaning towards comfort measures.  Recent GIB with anemia  12/2 admission for syncope, GIB  Found to have severe anemia with Hb 6.9  Received PRBC   EGD done showed NSAID related gastritis and gastric ulcers and Cameron's erosions. Per GI at that time, hold ASA for2 weeks from admission (restart 08/24/20), continue BID PPI for 4 months, start PO iron in 1 week.  This admission Hb 10.2->...->7.5->8.2->8.5->8.5->8.1->7.9->7.9->7.1->7.6  CBC monitoring  Iron resumed -> receiving iron IV daily now  PPI IV bid  AKI on CKD  Likely d/t contrast nephropathy  Cre 1.12->1.07->1.38->2.54->3.56->4.43->5.11->5.73->5.83->5.28->5.00->4.50 (gradually improving)  On IVF - NS @ 50-> LR @ 50->off   More urine output now  Nephrology onboard, appreciate help  On TF with Glucerna @ 45 and Free water 100 ml Q 4 hrs  received albumin/lasix challenge 12/14.   BMP monitoring  Trying to avoid dialysis  Hypertension  Home meds:  Toprol XL 25 mg daily, Lasix 40mg    Off cleviprex  Current BP meds - metoprolol 12.5->25->12.5 bid  Stable on the high end . Long-term BP goal 130-150 given left M2 occlusion  Hyperlipidemia  Home meds:  Lipitor 40mg  and Zetia 10mg    Home meds resumed   LDL 50, goal < 70  Continue statin at discharge  Diabetes type II Controlled  Home meds:  Insulin  Now - SSI  HgbA1c 5.6, goal < 7.0  CBG monitoring  Avoid hypoglycemia  Dysphagia   Did not pass swallow or MBS 12/13  Repeat MBS 08/26/2020 - pt to remain NPO  NPO  NGT -> cortrak  On TF @ 85 and FW  Speech on board  Palliative care on board; conversation Today with family for [place]  Other Stroke Risk Factors  Advanced Age >/= 75   Coronary artery disease  Congestive heart  failure  Other Active Problems and Findings  Incidental finding to f/u on as out pt: Multiple thyroid nodules, the largest within the left lobe measuring 2.3 cm.   Pt was enrolled in TIMELESS trial prior to thrombectomy.  Small right frontal scalp hematoma   Rt groin hematoma - resolving   Soft stool x 2 on 08/21/20  allopurinol renal dosing  Leukocytosis, febrile, pharmacy consulted for antibiotic dosing, infectious workup ordered. Cefepime started 12/19. Labs in AM. UA c/w UTI - culture pending. Possible pneumonia on CXR.  Labs 12/19 - improved  Labial wound: re-consult WOCN as labia is more swollen and now with weeping. Also, appreciate GYN consult for any recommendations.  Low UOP: Creatinine:3.95 Bun: 101.-> potential nephrology consult; if not made comfort care. Decision pending palliative care conversation.  Hospital day # [place], MSN, NP-C [facility] [phone] I have  personally obtained history,examined this patient, reviewed notes, independently viewed imaging studies, participated in medical decision making and plan of care.ROS completed by me personally and pertinent positives fully documented  I have made any additions or clarifications directly to the above note. Agree with note above. I spoke to the patient's granddaughter over the phone was currently [place] but plans to drive down later today.  She has spoken to palliative care nurse practitioner and is agreed to DNR and comfort care measures only as she does not want her grandmother to suffer she would not be happy with a feeding tube and life of dependency in the nursing home. Greater than 50% time during this 25-minute visit was spent on counseling and coordination of care and discussion with care team questions  Antony Contras, MD Medical Director [place] Pager: [phone] 08/30/2020 3:48 PM    To contact Stroke Continuity provider, please refer to  [email or web address] After hours, contact General Neurology

## 2020-08-31 ENCOUNTER — Ambulatory Visit: Payer: Medicare Other | Admitting: Podiatry

## 2020-08-31 LAB — GLUCOSE, CAPILLARY
Glucose-Capillary: 176 mg/dL — ABNORMAL HIGH (ref 70–99)
Glucose-Capillary: 177 mg/dL — ABNORMAL HIGH (ref 70–99)
Glucose-Capillary: 188 mg/dL — ABNORMAL HIGH (ref 70–99)
Glucose-Capillary: 189 mg/dL — ABNORMAL HIGH (ref 70–99)
Glucose-Capillary: 191 mg/dL — ABNORMAL HIGH (ref 70–99)
Glucose-Capillary: 203 mg/dL — ABNORMAL HIGH (ref 70–99)
Glucose-Capillary: 206 mg/dL — ABNORMAL HIGH (ref 70–99)

## 2020-08-31 MED ORDER — VALACYCLOVIR HCL 500 MG PO TABS
500.0000 mg | ORAL_TABLET | Freq: Every day | ORAL | Status: DC
  Administered 2020-08-31 – 2020-09-01 (×2): 500 mg
  Filled 2020-08-31 (×2): qty 1

## 2020-08-31 NOTE — Progress Notes (Signed)
STROKE TEAM PROGRESS NOTE   INTERVAL HISTORY Patient neurological condition is unchanged.  She continues to have low-grade fever.  Family has agreed to DNR and want hospice home but want core track tube feeding until family arrives later this week.  Appreciate help from palliative care team Neurological exam is unchanged Vitals:   08/31/20 0045 08/31/20 0333 08/31/20 0814 08/31/20 1148  BP: (!) 109/51 (!) 132/53 (!) 119/46 (!) 110/47  Pulse: 93 99 96 86  Resp: 18 20 (!) 25 (!) 21  Temp: 99.5 F (37.5 C) (!) 100.5 F (38.1 C) 98.4 F (36.9 C) 99.9 F (37.7 C)  TempSrc: Oral Oral Axillary Axillary  SpO2: 96% 97% 96% 100%  Weight:      Height:       CBC:  Recent Labs  Lab 08/28/20 0121 08/29/20 0254  WBC 19.3* 14.4*  HGB 7.6* 7.0*  HCT 23.1* 23.0*  MCV 90.6 92.0  PLT 224 161   Basic Metabolic Panel:  Recent Labs  Lab 08/28/20 0121 08/29/20 0254 08/30/20 1451  NA 146* 147* 144  K 4.1 3.7 3.7  CL 109 111 108  CO2 24 25 23   GLUCOSE 239* 210* 210*  BUN 102* 101* 95*  CREATININE 4.50* 3.95* 3.46*  CALCIUM 8.4* 8.1* 7.9*  PHOS 3.4 3.0  --    IMAGING past 24 hours No results found.   PHYSICAL EXAM   Temp:  [98.2 F (36.8 C)-100.5 F (38.1 C)] 99.9 F (37.7 C) (12/22 1148) Pulse Rate:  [86-101] 86 (12/22 1148) Resp:  [16-25] 21 (12/22 1148) BP: (109-145)/(46-54) 110/47 (12/22 1148) SpO2:  [96 %-100 %] 100 % (12/22 1148)  General -elderly African-American lady appears to be restless.  Ophthalmologic - PERRL  Cardiovascular - Regular rhythm and rate.  Neuro - eyes open, mildly lethargic and falls back to sleep but easily arousable, still has severe dysarthria, intangible words. Expressive > receptive aphasia. Not able to answer questions, followed midline commands but not peripheral commands. Eyes midline, able to gaze bilaterally, tracking bilaterally, however, blinking to visual threat on the left but not on the right. Right facial droop, right eye closure  weaker than left. Tongue protrusion to the right. LUE at least 3/5 without drift and BLE slight withdraw to pain. RUE flaccid. Bilaterally babinski positive.   ASSESSMENT/PLAN Maria [name] is a 86 y.o. female with history of previous stroke, HTN, HLD, initially admitted for acute deconditioning, gen weakness post anemia/gastritis. She was an in-house code stroke when staff noted a change in mentation. CTA showed Left M2 occlusion for which she had emergent thrombectomy. Had IR - 12/9 5:25 PM - Occluded Lt MCA inf division in the mid M2 seg. Delated partial reconstitution of Lt MCA from Lt ACA collaterals retrogradely. Unsuccessful attempts at  revascularization due to severe prox  Lt ICA tortuosity and of the aortic arch.  Stroke: L MCA moderate stroke due to left M2 occlusion with unsuccessful IR and in TIMELESS trial, likely cardioembolic etiology  Code Stroke CT head: hyperdensity along the left M2 MCA in the sylvian fissure  CTA head & neck Distal branch of M2 occlusion.   CT perfusion 30ml with missmatch  MRI  Multiple scattered tiny strokes bilat, moderate LMCA stroke, small amt of petechial hemorrhage noted in bed of infarct. Small vessel ischemic disease and old lacunar strokes noted.   Repeat CTA unchanged left M2 occlusion  2D Echo -08/11/2020 - EF > 75%. No cardiac source of emboli identified.   LDL 50  HgbA1c 5.6  UDS - negative  SARS - negative  VTE prophylaxis - post TIMELESS trial, hold for possible IV thrombolytic administration - SCDs  ASA 81mg  (but this was recently put on hold by GI for acute gastritis and anemia) prior to admission, now on ASA 325 mg daily  Therapy recommendations:  SNF  Disposition:  Pending - palliative care on board and pending discussion of [place] - appreciate Palliative Care assistance. Pt remains a full code pending conversations with granddaughter who is leaning towards comfort measures.  Recent GIB with anemia  12/2 admission for  syncope, GIB  Found to have severe anemia with Hb 6.9  Received PRBC   EGD done showed NSAID related gastritis and gastric ulcers and Cameron's erosions. Per GI at that time, hold ASA for2 weeks from admission (restart 08/24/20), continue BID PPI for 4 months, start PO iron in 1 week.  This admission Hb 10.2->...->7.5->8.2->8.5->8.5->8.1->7.9->7.9->7.1->7.6  CBC monitoring  Iron resumed -> receiving iron IV daily now  PPI IV bid  AKI on CKD  Likely d/t contrast nephropathy  Cre 1.12->1.07->1.38->2.54->3.56->4.43->5.11->5.73->5.83->5.28->5.00->4.50 (gradually improving)  On IVF - NS @ 50-> LR @ 50->off   More urine output now  Nephrology onboard, appreciate help  On TF with Glucerna @ 45 and Free water 100 ml Q 4 hrs  received albumin/lasix challenge 12/14.   BMP monitoring  Trying to avoid dialysis  Hypertension  Home meds:  Toprol XL 25 mg daily, Lasix 40mg    Off cleviprex  Current BP meds - metoprolol 12.5->25->12.5 bid  Stable on the high end . Long-term BP goal 130-150 given left M2 occlusion  Hyperlipidemia  Home meds:  Lipitor 40mg  and Zetia 10mg    Home meds resumed   LDL 50, goal < 70  Continue statin at discharge  Diabetes type II Controlled  Home meds:  Insulin  Now - SSI  HgbA1c 5.6, goal < 7.0  CBG monitoring  Avoid hypoglycemia  Dysphagia   Did not pass swallow or MBS 12/13  Repeat MBS 08/26/2020 - pt to remain NPO  NPO  NGT -> cortrak  On TF @ 82 and FW   Speech on board   Palliative Care  Initial consult 08/26/2020  Pt remains a full code pending conversations with granddaughter who is leaning towards comfort measures.  Plan: from Palliative Care note today - Wed 08/31/20  - Continue current treatments until family able to arrive this weekend.   - Family agree with hospice facility placement although this is unlikely to be an option until comfort care pursued and Cortrak removed.   Code status -  DNR  Appreciate Palliative Care assistance  Leukocytosis / Fever  WBC's - 19.3->14.4  T Max - 99.9->100.5  Pharmacy consult for antibiotic dosing Cefepime started 12/19 -> ->  UA c/w UTI - culture - >100,000 colonies E-Coli  Possible pneumonia on CXR - 12/19 - More significant opacity in the left retrocardiac region is indeterminate and could represent atelectasis or pneumonia.  Other Stroke Risk Factors  Advanced Age >/= 66   Coronary artery disease  Congestive heart failure  Other Active Problems and Findings  Incidental finding to f/u on as out pt: Multiple thyroid nodules, the largest within the left lobe measuring 2.3 cm.   Pt was enrolled in TIMELESS trial prior to thrombectomy.  Small right frontal scalp hematoma   Rt groin hematoma - resolving   Soft stool x 2 on 08/21/20  allopurinol renal dosing  Labs 12/19 - improved  Labial wound:  re-consult WOCN as labia is more swollen and now with weeping. Also, appreciate GYN consult for any recommendations.  Low UOP: Creatinine:3.95 Bun: 101.-> potential nephrology consult; if not made comfort care. Decision pending palliative care conversation. (see above note)  Hospital day # 13  Plan continue ongoing medical care for now including tube feeds till family arrives and makes a full palliative care.  Consult Education officer, museum for hospice placement.  Greater than 50% time during this 25-minute visit was spent on counseling and coordination of care and discussion with care team Antony Contras, MD    To contact Stroke Continuity provider, please refer to [email or web address] After hours, contact General Neurology

## 2020-08-31 NOTE — Progress Notes (Signed)
  Speech Language Pathology Treatment: Dysphagia  Patient Details Name: MINDI AKERSON MRN: [id number] DOB: [birth year] Today's Date: 08/31/2020 Time: 0911-0930 SLP Time Calculation (min) (ACUTE ONLY): 19 min  Assessment / Plan / Recommendation Clinical Impression  Pt was alert and pleasantly interactive for treatment today.  She remains nonverbal.  Dry, thick secretions were removed from lips, tongue, and roof of mouth during oral care.  Pt was unable to accept teaspoons of thin liquids due to the extent of her oral motor weakness but would close her mouth around a moistened oral swab.  No appreciable swallow palpated with any trials but pt did appear to enjoy the cool moisture and nodded yes when asked if she wanted more.  Pt was left in bed with bed alarm set and call bell within reach.  Continue per current plan of care.    HPI HPI: ZERENITY BOWRON is a 86 y.o. with a history of previous stroke, hypertension, DM, hyperlipidemia who was recently admitted after syncopal episode and found to have anemia with gastritis and erosions (started on PPI).  Developed with right-sided weakness and aphasia. Found to have cerebral infarction due to occlusion or stenosis of left middle cerebral artery. CXR clear. No prior ST notes. She had neuro change on 12/11 with MRI pending. Previous MBS 12/13 recommended NPO and repeat today for possible improvements.      SLP Plan  Continue with current plan of care       Recommendations  Diet recommendations: NPO Medication Administration: Via alternative means                Oral Care Recommendations: Oral care QID Follow up Recommendations: Skilled Nursing facility SLP Visit Diagnosis: Dysphagia, oral phase (R13.11) Plan: Continue with current plan of care       GO                PageSelinda Orion 08/31/2020, 9:37 AM

## 2020-08-31 NOTE — Progress Notes (Signed)
Pharmacy Antibiotic Note  Maria [name] is a 86 y.o. female admitted on 08/13/2020 with leukocytosis, febrile, unknown source of infection.  Pharmacy has been consulted for cefepime dosing at the request of Dr. Jaynee Eagles.  Patient recently had urine culture (12/12) with mult species present, currently has a catheter - ?urinary source.  WBC decreasing, spiked temp this AM (100.5).  Repeat blood cultures pending. Urine culture shows E.coli sensitve to CTX and cefepime. Currently covering broadly for PNA as well. Scr some improved, but CrCl still <10 ml/min  Plan: Continue Cefepime 1gm IV q24h F/u blood cultures, urine culture and adjust abx as necessary Monitor clinical improvement, renal function  Height: 4\' 11"  (149.9 cm) Weight: 74 kg (163 lb 2.3 oz) IBW/kg (Calculated) : 43.2  Temp (24hrs), Avg:99.5 F (37.5 C), Min:98.2 F (36.8 C), Max:100.5 F (38.1 C)  Recent Labs  Lab 08/25/20 0025 08/26/20 0709 08/27/20 0304 08/28/20 0121 08/29/20 0254 08/30/20 1451  WBC 9.4 14.0* 16.9* 19.3* 14.4*  --   CREATININE 5.83* 5.28* 5.00* 4.50* 3.95* 3.46*    Estimated Creatinine Clearance: 9.1 mL/min (A) (by C-G formula based on SCr of 3.46 mg/dL (H)).    Allergies  Allergen Reactions  . Ace Inhibitors Other (See Comments)    Angioedema   . Betadine [Povidone Iodine] Itching  . Valsartan Hives and Other (See Comments)    Antimicrobials this admission: Cefepime 12/19 >> Valtrex 12/21>>   Microbiology results: 12/19 BCx: pending 12/19 UCx: E.coli 12/12 UCx: mult species present 12/10 MRSA PCR: negative  Maria [name] A. Levada Dy, PharmD, BCPS, FNKF Clinical Pharmacist [place] Please utilize Amion for appropriate phone number to reach the unit pharmacist ([place])   08/31/2020 8:01 AM

## 2020-08-31 NOTE — Progress Notes (Signed)
Palliative:  HPI: 86 y.o.femalewith history of previous stroke, HTN, HLD, initially admitted for acute deconditioning, gen weakness post anemia/gastritis.She was an in-house code stroke when staff noted a change in mentation.CTA showed Left M2 occlusion for which she had emergent thrombectomy. Continues with poor neurological status and severe dysphagia.  Maria [name] is lying in bed. Sleeping comfortably. No family at bedside. No changes to care today. She continues with poor prognosis. Family wish to continue current care until they arrive this weekend. When family arrives we will make transition to comfort care at that time. Family also open to hospice placement.   Exam: Sleeping comfortably. Somnolent. Unable to communicate or swallow secondary to stroke. No distress. Mouth with dried secretions. Breathing regular, unlabored. Abd soft, flat.   Plan: - Continue current treatments until family able to arrive this weekend.  - Family agree with hospice facility placement although this is unlikely to be an option until comfort care pursued and Cortrak removed.   15 min  Vinie Sill, NP Palliative Medicine Team Pager [phone] (Please see [email or web address] for schedule) Team Phone [phone]    Greater than 50%  of this time was spent counseling and coordinating care related to the above assessment and plan

## 2020-08-31 NOTE — Progress Notes (Signed)
CM following for residential hospice once cortrak removed.

## 2020-08-31 NOTE — Progress Notes (Signed)
PHARMACY NOTE:  ANTIMICROBIAL RENAL DOSAGE ADJUSTMENT  Current antimicrobial regimen includes a mismatch between antimicrobial dosage and estimated renal function.  As per policy approved by the Pharmacy & Therapeutics and Medical Executive Committees, the antimicrobial dosage will be adjusted accordingly.  Current antimicrobial dosage:  Valtrex 500mg  PT BID  Indication: Genital HSV  Renal Function:  Estimated Creatinine Clearance: 9.1 mL/min (A) (by C-G formula based on SCr of 3.46 mg/dL (H)). []      On intermittent HD, scheduled: []      On CRRT    Antimicrobial dosage has been changed to:  Valtrex 500mg  PT daily  Additional comments:   Roman Sandall A. Levada Dy, PharmD, BCPS, FNKF Clinical Pharmacist [place] Please utilize Amion for appropriate phone number to reach the unit pharmacist ([place])   08/31/2020 7:59 AM

## 2020-08-31 NOTE — Plan of Care (Signed)
  Problem: Education: Goal: Knowledge of disease or condition will improve Outcome: Progressing Goal: Knowledge of secondary prevention will improve Outcome: Progressing Goal: Knowledge of patient specific risk factors addressed and post discharge goals established will improve Outcome: Progressing Goal: Individualized Educational Video(s) Outcome: Progressing   Problem: Self-Care: Goal: Ability to participate in self-care as condition permits will improve Outcome: Not Progressing Goal: Ability to communicate needs accurately will improve Outcome: Not Progressing   Problem: Nutrition: Goal: Risk of aspiration will decrease Outcome: Not Progressing Goal: Dietary intake will improve Outcome: Not Progressing

## 2020-09-01 ENCOUNTER — Telehealth: Payer: Medicare Other

## 2020-09-01 LAB — AEROBIC/ANAEROBIC CULTURE W GRAM STAIN (SURGICAL/DEEP WOUND)
Gram Stain: NONE SEEN
Special Requests: NORMAL

## 2020-09-01 LAB — HEMOGLOBIN AND HEMATOCRIT, BLOOD
HCT: 22.1 % — ABNORMAL LOW (ref 36.0–46.0)
Hemoglobin: 6.7 g/dL — CL (ref 12.0–15.0)

## 2020-09-01 LAB — GLUCOSE, CAPILLARY
Glucose-Capillary: 179 mg/dL — ABNORMAL HIGH (ref 70–99)
Glucose-Capillary: 170 mg/dL — ABNORMAL HIGH (ref 70–99)
Glucose-Capillary: 180 mg/dL — ABNORMAL HIGH (ref 70–99)
Glucose-Capillary: 170 mg/dL — ABNORMAL HIGH (ref 70–99)

## 2020-09-01 LAB — BASIC METABOLIC PANEL
Anion gap: 12 (ref 5–15)
BUN: 93 mg/dL — ABNORMAL HIGH (ref 8–23)
CO2: 23 mmol/L (ref 22–32)
Calcium: 7.7 mg/dL — ABNORMAL LOW (ref 8.9–10.3)
Chloride: 105 mmol/L (ref 98–111)
Creatinine, Ser: 3.12 mg/dL — ABNORMAL HIGH (ref 0.44–1.00)
GFR, Estimated: 13 mL/min — ABNORMAL LOW (ref >60)
Glucose, Bld: 195 mg/dL — ABNORMAL HIGH (ref 70–99)
Potassium: 3.4 mmol/L — ABNORMAL LOW (ref 3.5–5.1)
Sodium: 140 mmol/L (ref 135–145)

## 2020-09-01 LAB — CBC
HCT: 20.7 % — ABNORMAL LOW (ref 36.0–46.0)
Hemoglobin: 6.3 g/dL — CL (ref 12.0–15.0)
MCH: 29.2 pg (ref 26.0–34.0)
MCHC: 30.4 g/dL (ref 30.0–36.0)
MCV: 95.8 fL (ref 80.0–100.0)
Platelets: 195 10*3/uL (ref 150–400)
RBC: 2.16 MIL/uL — ABNORMAL LOW (ref 3.87–5.11)
RDW: 22.4 % — ABNORMAL HIGH (ref 11.5–15.5)
WBC: 9.5 10*3/uL (ref 4.0–10.5)
nRBC: 0.2 % (ref 0.0–0.2)

## 2020-09-01 LAB — HSV CULTURE AND TYPING

## 2020-09-01 MED ORDER — ONDANSETRON 4 MG PO TBDP: 4.0000 mg | ORAL_TABLET | Freq: Four times a day (QID) | ORAL | Status: DC | PRN

## 2020-09-01 MED ORDER — ACETAMINOPHEN 650 MG RE SUPP: 650.0000 mg | RECTAL | Status: DC | PRN

## 2020-09-01 MED ORDER — MORPHINE SULFATE 10 MG/5ML PO SOLN: 2.5000 mg | ORAL | Status: DC | PRN

## 2020-09-01 MED ORDER — POLYVINYL ALCOHOL 1.4 % OP SOLN
1.0000 [drp] | Freq: Four times a day (QID) | OPHTHALMIC | Status: DC | PRN
  Filled 2020-09-01: qty 15

## 2020-09-01 MED ORDER — OXYCODONE HCL 5 MG/5ML PO SOLN: 2.5000 mg | ORAL | Status: DC | PRN

## 2020-09-01 MED ORDER — BIOTENE DRY MOUTH MT LIQD: 15.0000 mL | OROMUCOSAL | Status: DC | PRN

## 2020-09-01 MED ORDER — MORPHINE SULFATE (CONCENTRATE) 10 MG/0.5ML PO SOLN
2.5000 mg | ORAL | Status: DC | PRN
  Administered 2020-09-02 – 2020-09-05 (×6): 2.6 mg via SUBLINGUAL
  Filled 2020-09-01 (×6): qty 0.5

## 2020-09-01 MED ORDER — GLYCOPYRROLATE 0.2 MG/ML IJ SOLN
0.4000 mg | INTRAMUSCULAR | Status: DC | PRN
  Administered 2020-09-02 – 2020-09-06 (×3): 0.4 mg via INTRAVENOUS
  Filled 2020-09-01 (×3): qty 2

## 2020-09-01 MED ORDER — HALOPERIDOL 0.5 MG PO TABS: 0.5000 mg | ORAL_TABLET | ORAL | Status: DC | PRN

## 2020-09-01 MED ORDER — HALOPERIDOL LACTATE 2 MG/ML PO CONC
0.5000 mg | ORAL | Status: DC | PRN
  Filled 2020-09-01: qty 0.3

## 2020-09-01 MED ORDER — HALOPERIDOL LACTATE 5 MG/ML IJ SOLN: 0.5000 mg | INTRAMUSCULAR | Status: DC | PRN

## 2020-09-01 MED ORDER — ONDANSETRON HCL 4 MG/2ML IJ SOLN
4.0000 mg | Freq: Four times a day (QID) | INTRAMUSCULAR | Status: DC | PRN
  Administered 2020-09-09: 4 mg via INTRAVENOUS
  Filled 2020-09-01: qty 2

## 2020-09-01 NOTE — Progress Notes (Signed)
Nutrition Follow-up  DOCUMENTATION CODES:   Not applicable  INTERVENTION:  Until pt's family arrives and pt is transitioned to comfort care, continue tube feeding via Cortrak: - Glucerna 1.5 @ 45 ml/hr (1080 ml/day) - ProSource TF 45 ml BID - Free water flushes per MD, currently 250 ml q 4 hours  Tube feeding regimen provides 1700 kcal, 111 grams of protein, and 820 ml of H2O.   Total free water with flushes: 2320 ml   NUTRITION DIAGNOSIS:   Inadequate oral intake related to inability to eat as evidenced by NPO status.  ongoing  GOAL:   Patient will meet greater than or equal to 90% of their needs  Met with TF  MONITOR:   Diet advancement,TF tolerance  REASON FOR ASSESSMENT:   Consult Enteral/tube feeding initiation and management  ASSESSMENT:   Pt with PMH of CKD, HTN, gout, dementia, DM, HLD, and previous stroke admitted to ED on 12/7 for acute deconditioning and SNF placement.  12/09 - pt with right-sided weakness and aphasia, per CTA pt with L MCA stroke due to L M2 occlusion with unsuccessful IR  12/11 - failed swallow evaluation, NG tube placed 12/13 - failed MBS, NG tube exchanged for Cortrak 12/17 - repeat MBS with recommendations to continue NPO  Pt with poor prognosis. Palliative Medicine has been following pt. Per PMT, plan is to continue with current treatment until pt's family can come to the hospital this weekend, at which time pt will be transitioned to comfort care. Until that time, family would like pt to continue receiving TF via Cortrak. Current TF: Glucerna 1.5 @ 45 ml/hr, ProSource TF 45 ml BID, free water 250 ml q 4 hours  UOP: 822m x24 hours  Labs: K+ 3.4 (L), Cr 3.12 (H -- down from yesterday), CBGs 170-206 Medications: aranesp, foltx, ss novolog Q4H  Diet Order:   Diet Order            Diet NPO time specified  Diet effective now                 EDUCATION NEEDS:   No education needs have been identified at this time  Skin:   Skin Assessment: Skin Integrity Issues: Skin Integrity Issues:: DTI,Stage II,Other (Comment) DTI: sacrum Stage II: coccyx Other: MASD to groin  Last BM:  08/25/20 type 7 via rectal tube  Height:   Ht Readings from Last 1 Encounters:  08/25/2020 _0  (1.499 m)    Weight:   Wt Readings from Last 1 Encounters:  08/22/20 74 kg    Ideal Body Weight:  44.6 kg  BMI:  Body mass index is 32.95 kg/m.  Estimated Nutritional Needs:   Kcal:  1550-1700  Protein:  100-115 grams  Fluid:  >1.5 L/day    ALarkin Ina MS, RD, LDN RD pager number and weekend/on-call pager number located in [phone]

## 2020-09-01 NOTE — Progress Notes (Signed)
STROKE TEAM PROGRESS NOTE   INTERVAL HISTORY She is sitting up in bed.  Continues to have severe dysphagia dysarthria and speaks few words which are difficult to understand.  She continues to have right hemiplegia.  Her granddaughter came yesterday and agree to full comfort care measures and core track tube was discontinued.  Patient is on comfort feeds.  She is awaiting transfer to hospice nursing home which may not happen until early next week.  PHYSICAL EXAM    Temp:  [97.6 F (36.4 C)-98.9 F (37.2 C)] 97.6 F (36.4 C) (12/24 0745) Pulse Rate:  [85-92] 91 (12/24 0745) Resp:  [18-21] 18 (12/24 0745) BP: (110-135)/(44-77) 135/56 (12/24 0745) SpO2:  [95 %-98 %] 98 % (12/24 0745) FiO2 (%):  [21 %] 21 % (12/23 1640)  General -elderly African-American lady appears to be restless.  Ophthalmologic - PERRL  Cardiovascular - Regular rhythm and rate.  Neuro - eyes open, awake and alert still has severe dysarthria, intangible words. Expressive > receptive aphasia. Not able to answer questions, followed midline commands but not peripheral commands. Eyes midline, able to gaze bilaterally, tracking bilaterally, however, blinking to visual threat on the left but not on the right. Right facial droop, right eye closure weaker than left. Tongue protrusion to the right. LUE at least 3/5 without drift and BLE slight withdraw to pain. RUE flaccid. Bilaterally babinski positive.   ASSESSMENT/PLAN [name] is a 86 y.o. female with history of previous stroke, HTN, HLD, initially admitted for acute deconditioning, gen weakness post anemia/gastritis. She was an in-house code stroke when staff noted a change in mentation. CTA showed Left M2 occlusion for which she had emergent thrombectomy. Had IR - 12/9 5:25 PM - Occluded Lt MCA inf division in the mid M2 seg. Delated partial reconstitution of Lt MCA from Lt ACA collaterals retrogradely. Unsuccessful attempts at  revascularization due to severe prox   Lt ICA tortuosity and of the aortic arch.  Stroke: L MCA moderate stroke due to left M2 occlusion with unsuccessful IR and in TIMELESS trial, likely cardioembolic etiology  Code Stroke CT head: hyperdensity along the left M2 MCA in the sylvian fissure  CTA head & neck Distal branch of M2 occlusion.   CT perfusion 42m with missmatch  MRI  Multiple scattered tiny strokes bilat, moderate LMCA stroke, small amt of petechial hemorrhage noted in bed of infarct. Small vessel ischemic disease and old lacunar strokes noted.   Repeat CTA unchanged left M2 occlusion  2D Echo -08/11/2020 - EF > 75%. No cardiac source of emboli identified.   LDL 50  HgbA1c 5.6  UDS - negative  SARS - negative  VTE prophylaxis - SCDs  ASA 827m(but this was recently put on hold by GI for acute gastritis and anemia) prior to admission, now on ASA 325 mg daily  Therapy recommendations:  SNF  Disposition:  Pending   Family met with Dr. SeLeonie Mannd palliative care 12/22. Opted for comfort care. Feeding tube removed. DNR  Await Hospice bed (famiy prefers [facility]).   Comfort card orders in place.   Recent GIB with anemia  12/2 admission for syncope, GIB  Found to have severe anemia with Hb 6.9  Received PRBC   EGD done showed NSAID related gastritis and gastric ulcers and Cameron's erosions. Per GI at that time, hold ASA for2 weeks from admission (restart 08/24/20), continue BID PPI for 4 months, start PO iron in 1 week.  This admission Hb 10.2->...->7.5->8.2->8.5->8.5->8.1->7.9->7.9->7.1->7.6  Iron  resumed -> iron IV daily   PPI IV bid  AKI on CKD  Likely d/t contrast nephropathy  Cre 1.12->1.07->1.38->2.54->3.56->4.43->5.11->5.73->5.83->5.28->5.00->4.50   On IVF - NS @ 50-> LR @ 50->off   Increased urine output   Nephrology onboard  received albumin/lasix challenge 12/14.   TF with Glucerna @ 45 and Free water 100 ml Q 4 hrs->off for comfort care  Hypertension  Home meds:   Toprol XL 25 mg daily, Lasix 16m   Off cleviprex  Current BP meds - metoprolol 12.5->25->12.5 bid -> off   Hyperlipidemia  Home meds:  Lipitor 449mand Zetia 106m Home meds resumed   LDL 50, goal < 70  D/c meds for comfort care  Diabetes type II Controlled  Home meds:  Insulin  HgbA1c 5.6, goal < 7.0  Off SSI and CBG monitoring->comfort care  Dysphagia   Did not pass swallow or MBS 12/13  Repeat MBS 08/26/2020 - pt to remain NPO  NPO  NGT -> cortrak  TF @ 45 and FW -> off   Palliative Care  Initial consult 08/26/2020  Pt remains a full code pending conversations with granddaughter who is leaning towards comfort measures.  Family met with Dr. SetLeonie Mand palliative care 12/22. Opted for comfort care. Feeding tube removed. Await Hospice bed (famiy prefers [facility]).   Code status - DNR  Appreciate Palliative Care assistance  Leukocytosis / Fever  WBC's - 19.3->14.4  T Max - 99.9->100.5  UA c/w UTI - culture - >100,000 colonies E-Coli  Possible pneumonia on CXR - 12/19 - More significant opacity in the left retrocardiac region is indeterminate and could represent atelectasis or pneumonia.  Pharmacy consult for antibiotic dosing Cefepime started 12/19 -> off  Other Stroke Risk Factors  Advanced Age >/= 65   Coronary artery disease  Congestive heart failure  Other Active Problems and Findings  Incidental finding to f/u on as out pt: Multiple thyroid nodules, the largest within the left lobe measuring 2.3 cm.   Pt was enrolled in TIMELESS trial prior to thrombectomy.  Small right frontal scalp hematoma   Rt groin hematoma - resolving   Soft stool x 2 on 08/21/20  allopurinol renal dosing  Labial wound: re-consult WOCN as labia is more swollen and now with weeping. Also, appreciate GYN consult for any recommendations. Cx multi flora  Low UOP: Creatinine:3.95 Bun: 101.-> comfort care.   Hospital day # 15  Patient is now full comfort  care measures only.  Is getting comfort feeds.  Anticipate transfer to hospice nursing home after the weekend.  Family is in agreement with plan. PraAntony ContrasD  To contact Stroke Continuity provider, please refer to [email or web address] hours, contact General Neurology

## 2020-09-01 NOTE — Progress Notes (Signed)
STROKE TEAM PROGRESS NOTE   INTERVAL HISTORY Patient neurological condition is unchanged.  She is now afebrile.  White count is normalized.  Neurological exam is unchanged.  Patient daughter is driving from the oxygen today to see her.  Family wants her to go to hospice nursing home but no bed available today so far. Vitals:   08/31/20 2346 09/01/20 0410 09/01/20 0745 09/01/20 1227  BP: (!) 105/57 (!) 117/50 101/70 (!) 110/44  Pulse: 81 87 93 85  Resp: 20 20 (!) 21 (!) 21  Temp: 98.1 F (36.7 C) 97.6 F (36.4 C) 99.6 F (37.6 C) 98.9 F (37.2 C)  TempSrc: Oral Oral  Oral  SpO2: 96% 96% 95% 95%  Weight:      Height:       CBC:  Recent Labs  Lab 08/29/20 0254 09/01/20 0306 09/01/20 0523  WBC 14.4* 9.5  --   HGB 7.0* 6.3* 6.7*  HCT 23.0* 20.7* 22.1*  MCV 92.0 95.8  --   PLT 234 195  --    Basic Metabolic Panel:  Recent Labs  Lab 08/28/20 0121 08/29/20 0254 08/30/20 1451 09/01/20 0306  NA 146* 147* 144 140  K 4.1 3.7 3.7 3.4*  CL 109 111 108 105  CO2 24 25 23 23   GLUCOSE 239* 210* 210* 195*  BUN 102* 101* 95* 93*  CREATININE 4.50* 3.95* 3.46* 3.12*  CALCIUM 8.4* 8.1* 7.9* 7.7*  PHOS 3.4 3.0  --   --    IMAGING past 24 hours No results found.   PHYSICAL EXAM   Temp:  [97.6 F (36.4 C)-99.8 F (37.7 C)] 98.9 F (37.2 C) (12/23 1227) Pulse Rate:  [81-98] 85 (12/23 1227) Resp:  [19-21] 21 (12/23 1227) BP: (101-120)/(44-70) 110/44 (12/23 1227) SpO2:  [95 %-100 %] 95 % (12/23 1227)  General -elderly African-American lady appears to be restless.  Ophthalmologic - PERRL  Cardiovascular - Regular rhythm and rate.  Neuro - eyes open, mildly lethargic and falls back to sleep but easily arousable, still has severe dysarthria, intangible words. Expressive > receptive aphasia. Not able to answer questions, followed midline commands but not peripheral commands. Eyes midline, able to gaze bilaterally, tracking bilaterally, however, blinking to visual threat on the  left but not on the right. Right facial droop, right eye closure weaker than left. Tongue protrusion to the right. LUE at least 3/5 without drift and BLE slight withdraw to pain. RUE flaccid. Bilaterally babinski positive.   ASSESSMENT/PLAN Maria [name] is a 86 y.o. female with history of previous stroke, HTN, HLD, initially admitted for acute deconditioning, gen weakness post anemia/gastritis. She was an in-house code stroke when staff noted a change in mentation. CTA showed Left M2 occlusion for which she had emergent thrombectomy. Had IR - 12/9 5:25 PM - Occluded Lt MCA inf division in the mid M2 seg. Delated partial reconstitution of Lt MCA from Lt ACA collaterals retrogradely. Unsuccessful attempts at  revascularization due to severe prox  Lt ICA tortuosity and of the aortic arch.  Stroke: L MCA moderate stroke due to left M2 occlusion with unsuccessful IR and in TIMELESS trial, likely cardioembolic etiology  Code Stroke CT head: hyperdensity along the left M2 MCA in the sylvian fissure  CTA head & neck Distal branch of M2 occlusion.   CT perfusion 29ml with missmatch  MRI  Multiple scattered tiny strokes bilat, moderate LMCA stroke, small amt of petechial hemorrhage noted in bed of infarct. Small vessel ischemic disease and old lacunar  strokes noted.   Repeat CTA unchanged left M2 occlusion  2D Echo -08/11/2020 - EF > 75%. No cardiac source of emboli identified.   LDL 50  HgbA1c 5.6  UDS - negative  SARS - negative  VTE prophylaxis - post TIMELESS trial, hold for possible IV thrombolytic administration - SCDs  ASA 81mg  (but this was recently put on hold by GI for acute gastritis and anemia) prior to admission, now on ASA 325 mg daily  Therapy recommendations:  SNF  Disposition:  Pending - palliative care on board and pending discussion of [place] - appreciate Palliative Care assistance. Pt remains a full code pending conversations with granddaughter who is leaning towards  comfort measures.  Recent GIB with anemia  12/2 admission for syncope, GIB  Found to have severe anemia with Hb 6.9  Received PRBC   EGD done showed NSAID related gastritis and gastric ulcers and Cameron's erosions. Per GI at that time, hold ASA for2 weeks from admission (restart 08/24/20), continue BID PPI for 4 months, start PO iron in 1 week.  This admission Hb 10.2->...->7.5->8.2->8.5->8.5->8.1->7.9->7.9->7.1->7.6  CBC monitoring  Iron resumed -> receiving iron IV daily now  PPI IV bid  AKI on CKD  Likely d/t contrast nephropathy  Cre 1.12->1.07->1.38->2.54->3.56->4.43->5.11->5.73->5.83->5.28->5.00->4.50 (gradually improving)  On IVF - NS @ 50-> LR @ 50->off   More urine output now  Nephrology onboard, appreciate help  On TF with Glucerna @ 45 and Free water 100 ml Q 4 hrs  received albumin/lasix challenge 12/14.   BMP monitoring  Trying to avoid dialysis  Hypertension  Home meds:  Toprol XL 25 mg daily, Lasix 40mg    Off cleviprex  Current BP meds - metoprolol 12.5->25->12.5 bid  Stable on the high end . Long-term BP goal 130-150 given left M2 occlusion  Hyperlipidemia  Home meds:  Lipitor 40mg  and Zetia 10mg    Home meds resumed   LDL 50, goal < 70  Continue statin at discharge  Diabetes type II Controlled  Home meds:  Insulin  Now - SSI  HgbA1c 5.6, goal < 7.0  CBG monitoring  Avoid hypoglycemia  Dysphagia   Did not pass swallow or MBS 12/13  Repeat MBS 08/26/2020 - pt to remain NPO  NPO  NGT -> cortrak  On TF @ 43 and FW   Speech on board   Palliative Care  Initial consult 08/26/2020  Pt remains a full code pending conversations with granddaughter who is leaning towards comfort measures.  Plan: from Palliative Care note today - Wed 08/31/20  - Continue current treatments until family able to arrive this weekend.   - Family agree with hospice facility placement although this is unlikely to be an option until comfort  care pursued and Cortrak removed.   Code status - DNR  Appreciate Palliative Care assistance  Leukocytosis / Fever  WBC's - 19.3->14.4  T Max - 99.9->100.5  Pharmacy consult for antibiotic dosing Cefepime started 12/19 -> ->  UA c/w UTI - culture - >100,000 colonies E-Coli  Possible pneumonia on CXR - 12/19 - More significant opacity in the left retrocardiac region is indeterminate and could represent atelectasis or pneumonia.  Other Stroke Risk Factors  Advanced Age >/= 45   Coronary artery disease  Congestive heart failure  Other Active Problems and Findings  Incidental finding to f/u on as out pt: Multiple thyroid nodules, the largest within the left lobe measuring 2.3 cm.   Pt was enrolled in TIMELESS trial prior to thrombectomy.  Small right frontal  scalp hematoma   Rt groin hematoma - resolving   Soft stool x 2 on 08/21/20  allopurinol renal dosing  Labs 12/19 - improved  Labial wound: re-consult WOCN as labia is more swollen and now with weeping. Also, appreciate GYN consult for any recommendations.  Low UOP: Creatinine:3.95 Bun: 101.-> potential nephrology consult; if not made comfort care. Decision pending palliative care conversation. (see above note)  Hospital day # 14  Plan continue ongoing medical care for now including tube feeds till family arrives and and then make her full palliative care.  Consulted Education officer, museum for hospice placement but looks like no bed available today.  Spoke to both her granddaughters over the phone and gave them update and answered questions..  Discussed with Education officer, museum and case Freight forwarder.  Greater than 50% time during this 25-minute visit was spent on counseling and coordination of care and discussion with care team Antony Contras, MD    To contact Stroke Continuity provider, please refer to [email or web address] After hours, contact General Neurology

## 2020-09-01 NOTE — TOC Progression Note (Signed)
Transition of Care [facility]) - Progression Note    Patient Details  Name: Maria [name] MRN: [id number] Date of Birth: Oct 13, 1925  Transition of Care [facility] CM/SW Contact  Pollie Friar, RN Phone Number: 09/01/2020, 12:48 PM  Clinical Narrative:    Plan is for hospice after family arrives from out of town. CM spoke to pts granddaughter, over the phone, since [place] have any beds until after the holiday, to see if another facility would be acceptable. Per granddaughter they have other family that is local and this would be a hardship on them. She prefers to have patient d/c to [facility] when bed available.  TOC following.   Expected Discharge Plan: Hospice Medical Facility Barriers to Discharge: Other (comment)  Expected Discharge Plan and Services Expected Discharge Plan: [place]   Discharge Planning Services: CM Consult Post Acute Care Choice: Hospice Living arrangements for the past 2 months: Single Family Home                                       Social Determinants of Health (SDOH) Interventions    Readmission Risk Interventions No flowsheet data found.

## 2020-09-01 NOTE — Progress Notes (Signed)
CRITICAL VALUE ALERT  Critical Value:  Hemoglobin = 6.3  Date & Time Notied:  09/01/2020 at 0458  Provider Notified:Sent page to Bartholome Bill.)  Orders Received/Actions taken:

## 2020-09-01 NOTE — Progress Notes (Signed)
Palliative:  HPI:86 y.o.femalewith history of previous stroke, HTN, HLD, initially admitted for acute deconditioning, gen weakness post anemia/gastritis.She was an in-house code stroke when staff noted a change in mentation.CTA showed Left M2 occlusion for which she had emergent thrombectomy.Continues with poor neurological status and severe dysphagia.  I met today at Maria [name]'s bedside. She is sleeping comfortably.   I was called back to room by RN stating that family had arrived to bedside. I met with granddaughter, [name], at bedside. She confirms desire to pursue comfort care and d/c Cortrak and other aggressive care measures at this time now that she has arrived. She would like to pursue hospice placement at [facility]. We discussed other options for hospice facility that may allow for more liberalized visitation (for patient's grandchildren) but [name] is hopeful for [facility] and wants to stay close to Ms. Liwanag' in case of acute decline at end of life she can get to her quickly.   All questions/concerns addressed. Emotional support provided.   Exam: Alert, Unable to communicate or swallow secondary to stroke. Garbled speech efforts but unable to understand. No distress. Smiling. Mouth with dried secretions. Breathing regular, unlabored. Abd soft, flat.   Plan: - Full comfort care now that family have arrived to bedside.  - Hopeful for [facility] bed placement.  - Liberalized visitation per EOL policy.   25 min  Vinie Sill, NP Palliative Medicine Team Pager [phone] (Please see [email or web address] for schedule) Team Phone [phone]    Greater than 50%  of this time was spent counseling and coordinating care related to the above assessment and plan

## 2020-09-01 NOTE — Progress Notes (Signed)
Manufacturing engineer [facility])  Referral received for EOL care at [facility].  There is not a bed available likely for the next few days.  Noted that other facilities are not preferred due to the close proximity of [facility].  Will place Ms. Devall on our list for a bed.  ACC will update TOC and family once a bed is available.  Venia Carbon RN, BSN, [facility] Liaison

## 2020-09-02 LAB — CULTURE, BLOOD (ROUTINE X 2)
Culture: NO GROWTH
Culture: NO GROWTH
Special Requests: ADEQUATE
Special Requests: ADEQUATE

## 2020-09-02 MED ORDER — ACETAMINOPHEN 650 MG RE SUPP: 650.0000 mg | RECTAL | Status: DC | PRN

## 2020-09-02 MED ORDER — MORPHINE SULFATE (PF) 2 MG/ML IV SOLN
1.0000 mg | Freq: Once | INTRAVENOUS | Status: AC
  Administered 2020-09-02: 10:00:00 1 mg via INTRAVENOUS
  Filled 2020-09-02: qty 1

## 2020-09-02 NOTE — Plan of Care (Signed)
  Problem: Education: Goal: Knowledge of disease or condition will improve Outcome: Not Progressing Goal: Knowledge of secondary prevention will improve Outcome: Not Progressing Goal: Knowledge of patient specific risk factors addressed and post discharge goals established will improve Outcome: Not Progressing Goal: Individualized Educational Video(s) Outcome: Not Progressing   Problem: Coping: Goal: Will verbalize positive feelings about self Outcome: Not Progressing Goal: Will identify appropriate support needs Outcome: Not Progressing   Problem: Health Behavior/Discharge Planning: Goal: Ability to manage health-related needs will improve Outcome: Not Progressing   Problem: Self-Care: Goal: Ability to participate in self-care as condition permits will improve Outcome: Not Progressing Goal: Verbalization of feelings and concerns over difficulty with self-care will improve Outcome: Not Progressing Goal: Ability to communicate needs accurately will improve Outcome: Not Progressing   Problem: Nutrition: Goal: Risk of aspiration will decrease Outcome: Not Progressing Goal: Dietary intake will improve Outcome: Not Progressing   Problem: Ischemic Stroke/TIA Tissue Perfusion: Goal: Complications of ischemic stroke/TIA will be minimized Outcome: Not Progressing   Problem: Education: Goal: Knowledge of General Education information will improve Description Including pain rating scale, medication(s)/side effects and non-pharmacologic comfort measures Outcome: Not Progressing   Problem: Health Behavior/Discharge Planning: Goal: Ability to manage health-related needs will improve Outcome: Not Progressing   Problem: Clinical Measurements: Goal: Ability to maintain clinical measurements within normal limits will improve Outcome: Not Progressing Goal: Will remain free from infection Outcome: Not Progressing Goal: Diagnostic test results will improve Outcome: Not  Progressing Goal: Respiratory complications will improve Outcome: Not Progressing Goal: Cardiovascular complication will be avoided Outcome: Not Progressing   Problem: Activity: Goal: Risk for activity intolerance will decrease Outcome: Not Progressing   Problem: Nutrition: Goal: Adequate nutrition will be maintained Outcome: Not Progressing   Problem: Coping: Goal: Level of anxiety will decrease Outcome: Not Progressing   Problem: Elimination: Goal: Will not experience complications related to bowel motility Outcome: Not Progressing Goal: Will not experience complications related to urinary retention Outcome: Not Progressing   Problem: Pain Managment: Goal: General experience of comfort will improve Outcome: Not Progressing   Problem: Safety: Goal: Ability to remain free from injury will improve Outcome: Not Progressing   Problem: Skin Integrity: Goal: Risk for impaired skin integrity will decrease Outcome: Not Progressing   

## 2020-09-02 NOTE — Progress Notes (Signed)
Palliative:  HPI:86 y.o.femalewith history of previous stroke, HTN, HLD, initially admitted for acute deconditioning, gen weakness post anemia/gastritis.She was an in-house code stroke when staff noted a change in mentation.CTA showed Left M2 occlusion for which she had emergent thrombectomy.Continues with poor neurological status and severe dysphagia.  I met again today at Ms. Abel' bedside. No family present. She is sleeping comfortably but awakens when I come into her room. She smiles. Able to nod head yes/no. She attempts verbal speech but garbled and unable to understand. I asked if she has any pain and she nods head yes but unable to assess further due to aphasia. Requested RN to bring her pain medication. Nods head no to shortness of breath.   Ms. Swing enjoys many soap opera "stories" that she and her granddaughter discuss regularly as a shared enjoyment. She also enjoys westerns. Awaiting [facility] bed. Discussed with granddaughter yesterday and they prefer placement in [facility] ([place] is only hospice facility in [place]).   Emotional support provided.   Exam: Sleeping but awakens easily. Able to track and nod head yes/no. Garbled speech unable to understand but she makes attempts. Dried secretions in oral cavity. No distress. Breathing regular, unlabored. Warm to touch.   Plan: - Full comfort care.  - Hopeful for [facility] bed placement.  - Liberalized visitation per EOL policy.   15 min  Vinie Sill, NP Palliative Medicine Team Pager [phone] (Please see [email or web address] for schedule) Team Phone [phone]    Greater than 50%  of this time was spent counseling and coordinating care related to the above assessment and plan

## 2020-09-02 NOTE — Progress Notes (Signed)
[facility] ([facility]) Hospital Liaison note.    [facility] is unable to offer a room today. Hospital Liaison will follow up tomorrow or sooner if a room becomes available. Please do not hesitate to call with questions.    Thank you for the opportunity to participate in this patient's care.  Chrislyn King, BSN, RN [facility] Hospital Liaison (listed on AMION under [facility]osp[facility])    [phone]  

## 2020-09-03 NOTE — Progress Notes (Signed)
STROKE TEAM PROGRESS NOTE   INTERVAL HISTORY Patient is full comfort care measures only.  She remains with severe dysarthria and expressive aphasia and dense hemiplegia.  No changes.   PHYSICAL EXAM    Temp:  [97.7 F (36.5 C)-97.8 F (36.6 C)] 97.7 F (36.5 C) (12/25 0754) Pulse Rate:  [81-87] 81 (12/25 0754) Resp:  [17-18] 18 (12/25 0754) BP: (137-142)/(54-58) 142/54 (12/25 0754) SpO2:  [100 %] 100 % (12/25 0754)  General -elderly African-American lady appears to be restless.  Ophthalmologic - PERRL  Cardiovascular - Regular rhythm and rate.  Neuro - eyes open, awake and alert still has severe dysarthria, intangible words. Expressive > receptive aphasia. Not able to answer questions, followed midline commands but not peripheral commands. Eyes midline, able to gaze bilaterally, tracking bilaterally, however, blinking to visual threat on the left but not on the right. Right facial droop, right eye closure weaker than left. Tongue protrusion to the right. LUE at least 3/5 without drift and BLE slight withdraw to pain. RUE flaccid. Bilaterally babinski positive.   ASSESSMENT/PLAN Ms. GENEVIENE TESCH is a 86 y.o. female with history of previous stroke, HTN, HLD, initially admitted for acute deconditioning, gen weakness post anemia/gastritis. She was an in-house code stroke when staff noted a change in mentation. CTA showed Left M2 occlusion for which she had emergent thrombectomy. Had IR - 12/9 5:25 PM - Occluded Lt MCA inf division in the mid M2 seg. Delated partial reconstitution of Lt MCA from Lt ACA collaterals retrogradely. Unsuccessful attempts at  revascularization due to severe prox  Lt ICA tortuosity and of the aortic arch.  Stroke: L MCA moderate stroke due to left M2 occlusion with unsuccessful IR and in TIMELESS trial, likely cardioembolic etiology  Code Stroke CT head: hyperdensity along the left M2 MCA in the sylvian fissure  CTA head & neck Distal branch of M2 occlusion.    CT perfusion 74m with missmatch  MRI  Multiple scattered tiny strokes bilat, moderate LMCA stroke, small amt of petechial hemorrhage noted in bed of infarct. Small vessel ischemic disease and old lacunar strokes noted.   Repeat CTA unchanged left M2 occlusion  2D Echo -08/11/2020 - EF > 75%. No cardiac source of emboli identified.   LDL 50  HgbA1c 5.6  UDS - negative  SARS - negative  VTE prophylaxis - SCDs  ASA 871m(but this was recently put on hold by GI for acute gastritis and anemia) prior to admission, now on ASA 325 mg daily -> D/C'd  Therapy recommendations:  Awaiting hospice bed.  Disposition:  Pending   Family met with Dr. SeLeonie Mannd palliative care 12/22. Opted for comfort care. DNRAwait Hospice bed (famiy prefers [facility]).   Comfort care orders in place. SL morphine prn pain.  Cortrak removed -> comfort feeds available  Unrestricted visitation   [facility] - unable to offer a bed at this time (famiy prefers [facility]   Recent GIB with anemia  12/2 admission for syncope, GIB  Found to have severe anemia with Hb 6.9  Received PRBC   EGD done showed NSAID related gastritis and gastric ulcers and Cameron's erosions. Per GI at that time, hold ASA for2 weeks from admission (restart 08/24/20), continue BID PPI for 4 months, start PO iron in 1 week.  This admission Hb 10.2->...->7.5->8.2->8.5->8.5->8.1->7.9->7.9->7.1->7.6  Iron resumed -> iron IV daily   PPI IV bid  AKI on CKD  Likely d/t contrast nephropathy  Cre 1.12->1.07->1.38->2.54->3.56->4.43->5.11->5.73->5.83->5.28->5.00->4.50  On IVF - NS @ 50-> LR @ 50->off   Increased urine output   Nephrology onboard  received albumin/lasix challenge 12/14.   TF with Glucerna @ 45 and Free water 100 ml Q 4 hrs->off for comfort care  Hypertension  Home meds:  Toprol XL 25 mg daily, Lasix 58m   Off cleviprex  Current BP meds - metoprolol 12.5->25->12.5 bid  -> off   Hyperlipidemia  Home meds:  Lipitor 45mand Zetia 1052m Home meds resumed   LDL 50, goal < 70  D/c meds for comfort care  Diabetes type II Controlled  Home meds:  Insulin  HgbA1c 5.6, goal < 7.0  Off SSI and CBG monitoring->comfort care  Dysphagia   Did not pass swallow or MBS 12/13  Repeat MBS 08/26/2020 - pt to remain NPO  NPO  NGT -> cortrak - removed - comfort feeds  TF @ 45 and FW -> off   Palliative Care  Initial consult 08/26/2020  Pt remains a full code pending conversations with granddaughter who is leaning towards comfort measures.  Family met with Dr. SetLeonie Mand palliative care 12/22. Opted for comfort care. Feeding tube removed. Await Hospice bed (famiy prefers [facility]).   Code status - DNR  Appreciate Palliative Care assistance  Leukocytosis / Fever  WBC's - 19.3->14.4  T Max - 99.9->100.5->97.8  UA c/w UTI - culture - >100,000 colonies E-Coli  Possible pneumonia on CXR - 12/19 - More significant opacity in the left retrocardiac region is indeterminate and could represent atelectasis or pneumonia.  Pharmacy consult for antibiotic dosing Cefepime started 12/19 -> off  Other Stroke Risk Factors  Advanced Age >/= 65   Coronary artery disease  Congestive heart failure  Other Active Problems and Findings  Incidental finding - multiple thyroid nodules, the largest within the left lobe measuring 2.3 cm.   Pt was enrolled in TIMELESS trial prior to thrombectomy.  Small right frontal scalp hematoma   Rt groin hematoma - resolving   Soft stool x 2 on 08/21/20  Allopurinol renal dosing  Labial wound: re-consult WOCN as labia is more swollen and now with weeping. Also, appreciate GYN consult for any recommendations. Cx multi flora  Low UOP: Creatinine:3.95 Bun: 101.-> comfort care.    Hospital day # 16 Continue comfort care measures.  Await transfer to hospice nursing home over the next few days when available.  No  family at bedside.  Appreciate help from palliative care team PraAntony ContrasD  To contact Stroke Continuity provider, please refer to [email or web address], contact General Neurology

## 2020-09-03 NOTE — Progress Notes (Signed)
[facility] ([facility]) Hospital Liaison note.   ° °[facility] is unable to offer a room today. Hospice liaison will update with any change. Please do not hesitate to call with questions.     °A Please do not hesitate to call with questions.     ° °Thank you,     °Mary Anne Robertson, RN, CCM        °[facility] Hospital Liaison (listed on AMION under [facility]osp[facility])      °[phone]  °

## 2020-09-04 NOTE — Progress Notes (Signed)
   Palliative Medicine Inpatient Follow Up Note  Reason for consult:  Goals of Care  HPI:  Per Neurology Note --> [name] a 86 y.o.femalewith history of previous stroke, HTN, HLD, initially admitted for acute deconditioning, gen weakness post anemia/gastritis.She was an in-house code stroke when staff noted a change in mentation.CTA showed Left M2 occlusion for which she had emergent thrombectomy. Had IR-12/9 5:25 PM - Occluded Lt MCA inf division in the mid M2 seg. Delated partial reconstitution of Lt MCA from Lt ACA collaterals retrogradely. Unsuccessful attempts at revascularization due to severe prox Lt ICA tortuosity and of the aortic arch.  Today's Discussion (09/03/2020): Chart reviewed.  I met with Maria [name] at bedside this afternoon. Her granddaughter, [name] was also present. We discussed Maria [name]'s condition and the continued plan to maintain comfort while in house. [name] asked is Maria [name] could have some ice cream. We were able to provide her with this while at bedside. Otherwise [name] did not have any additional questions.  In terms of a [facility] bed - there are presently none to offer therefore we will continue with comfort focused care in house.   Objective Assessment: Vital Signs Vitals:   09/04/20 0403 09/04/20 0846  BP: 136/64 (!) 150/68  Pulse: (!) 103 (!) 103  Resp: 20 18  Temp: 99.6 F (37.6 C) 98.1 F (36.7 C)  SpO2: 94% 100%    Intake/Output Summary (Last 24 hours) at 09/04/2020 0888 Last data filed at 09/03/2020 2107 Gross per 24 hour  Intake --  Output 800 ml  Net -800 ml   Last Weight  Most recent update: 08/22/2020  5:47 AM   Weight  74 kg (163 lb 2.3 oz)           Gen:  Elderly AA F HEENT: Dry mucous membranes - oral thrush CV: Regular rate and rhythm PULM: clear to auscultation bilaterally. No wheezes/rales/rhonchi ABD: soft/nontender/nondistended/normal bowel sounds EXT: No edema Neuro: Alert  SUMMARY OF  RECOMMENDATIONS - DNAR/DNI - Full comfort care.  - Hopeful for [facility] bed placement.  - Liberalized visitation per EOL policy - Ongoing PMT support  Time Spent: 25 Greater than 50% of the time was spent in counseling and coordination of care ______________________________________________________________________________________ [place] Team Team Cell Phone: [phone] Please utilize secure chat with additional questions, if there is no response within 30 minutes please call the above phone number  Palliative Medicine Team providers are available by phone from 7am to 7pm daily and can be reached through the team cell phone.  Should this patient require assistance outside of these hours, please call the patient's attending physician.

## 2020-09-04 NOTE — Plan of Care (Signed)
Not progressing 

## 2020-09-04 NOTE — Progress Notes (Signed)
STROKE TEAM PROGRESS NOTE   INTERVAL HISTORY Speech slightly more clear today..  She remains with severe dysarthria and expressive aphasia and dense hemiplegia.  No changes.  [place] still has no hospice bed today. PHYSICAL EXAM    Temp:  [98.1 F (36.7 C)-99.6 F (37.6 C)] 98.1 F (36.7 C) (12/26 0846) Pulse Rate:  [96-103] 103 (12/26 0846) Resp:  [18-20] 18 (12/26 0846) BP: (126-150)/(57-68) 150/68 (12/26 0846) SpO2:  [94 %-100 %] 100 % (12/26 0846)  General -elderly African-American lady appears to be restless.  Ophthalmologic - PERRL  Cardiovascular - Regular rhythm and rate.  Neuro - eyes open, awake and alert still has severe dysarthria, intangible words. Expressive > receptive aphasia. Not able to answer questions, followed midline commands but not peripheral commands. Eyes midline, able to gaze bilaterally, tracking bilaterally, however, blinking to visual threat on the left but not on the right. Right facial droop, right eye closure weaker than left. Tongue protrusion to the right. LUE at least 3/5 without drift and BLE slight withdraw to pain. RUE flaccid. Bilaterally babinski positive.   ASSESSMENT/PLAN [name] is a 86 y.o. female with history of previous stroke, HTN, HLD, initially admitted for acute deconditioning, gen weakness post anemia/gastritis. She was an in-house code stroke when staff noted a change in mentation. CTA showed Left M2 occlusion for which she had emergent thrombectomy. Had IR - 12/9 5:25 PM - Occluded Lt MCA inf division in the mid M2 seg. Delated partial reconstitution of Lt MCA from Lt ACA collaterals retrogradely. Unsuccessful attempts at  revascularization due to severe prox  Lt ICA tortuosity and of the aortic arch.  Stroke: L MCA moderate stroke due to left M2 occlusion with unsuccessful IR and in TIMELESS trial, likely cardioembolic etiology  Code Stroke CT head: hyperdensity along the left M2 MCA in the sylvian fissure  CTA  head & neck Distal branch of M2 occlusion.   CT perfusion 85m with missmatch  MRI  Multiple scattered tiny strokes bilat, moderate LMCA stroke, small amt of petechial hemorrhage noted in bed of infarct. Small vessel ischemic disease and old lacunar strokes noted.   Repeat CTA unchanged left M2 occlusion  2D Echo -08/11/2020 - EF > 75%. No cardiac source of emboli identified.   LDL 50  HgbA1c 5.6  UDS - negative  SARS - negative  VTE prophylaxis - SCDs  ASA 885m(but this was recently put on hold by GI for acute gastritis and anemia) prior to admission, now on ASA 325 mg daily -> D/C'd  Therapy recommendations:  Awaiting hospice bed.  Disposition:  Pending   Family met with Dr. SeLeonie [name] palliative care 12/22. Opted for comfort care. DNR - Await Hospice bed (famiy prefers [facility]).   Comfort care orders in place. SL morphine prn pain.  Cortrak removed -> comfort feeds available  Unrestricted visitation   [facility]/24 - unable to offer a bed at this time (famiy prefers [facility]   Recent GIB with anemia  12/2 admission for syncope, GIB  Found to have severe anemia with Hb 6.9  Received PRBC   EGD done showed NSAID related gastritis and gastric ulcers and Cameron's erosions. Per GI at that time, hold ASA for2 weeks from admission (restart 08/24/20), continue BID PPI for 4 months, start PO iron in 1 week.  This admission Hb 10.2->...->7.5->8.2->8.5->8.5->8.1->7.9->7.9->7.1->7.6  Iron resumed -> iron IV daily   PPI IV bid  AKI on CKD  Likely d/t contrast nephropathy  Cre 1.12->1.07->1.38->2.54->3.56->4.43->5.11->5.73->5.83->5.28->5.00->4.50   On IVF - NS @ 50-> LR @ 50->off   Increased urine output   Nephrology onboard  received albumin/lasix challenge 12/14.   TF with Glucerna @ 45 and Free water 100 ml Q 4 hrs->off for comfort care  Hypertension  Home meds:  Toprol XL 25 mg daily, Lasix 75m   Off  cleviprex  Current BP meds - metoprolol 12.5->25->12.5 bid -> off   Hyperlipidemia  Home meds:  Lipitor 44mand Zetia 1072m Home meds resumed   LDL 50, goal < 70  D/c meds for comfort care  Diabetes type II Controlled  Home meds:  Insulin  HgbA1c 5.6, goal < 7.0  Off SSI and CBG monitoring->comfort care  Dysphagia   Did not pass swallow or MBS 12/13  Repeat MBS 08/26/2020 - pt to remain NPO  NPO  NGT -> cortrak - removed - comfort feeds  TF @ 45 and FW -> off   Palliative Care  Initial consult 08/26/2020  Pt remains a full code pending conversations with granddaughter who is leaning towards comfort measures.  Family met with Dr. SetLeonie [name] palliative care 12/22. Opted for comfort care. Feeding tube removed. Await Hospice bed (famiy prefers [facility]).   Code status - DNR  Appreciate Palliative Care assistance  Leukocytosis / Fever  WBC's - 19.3->14.4  T Max - 99.9->100.5->97.8->99.6  UA c/w UTI - culture - >100,000 colonies E-Coli  Possible pneumonia on CXR - 12/19 - More significant opacity in the left retrocardiac region is indeterminate and could represent atelectasis or pneumonia.  Pharmacy consult for antibiotic dosing Cefepime started 12/19 -> off  Other Stroke Risk Factors  Advanced Age >/= 65   Coronary artery disease  Congestive heart failure  Other Active Problems and Findings  Incidental finding - multiple thyroid nodules, the largest within the left lobe measuring 2.3 cm.   Pt was enrolled in TIMELESS trial prior to thrombectomy.  Small right frontal scalp hematoma   Rt groin hematoma - resolving   Soft stool x 2 on 08/21/20  Allopurinol renal dosing  Labial wound: re-consult WOCN as labia is more swollen and now with weeping. Also, appreciate GYN consult for any recommendations. Cx multi flora  Low UOP: Creatinine:3.95 Bun: 101.-> comfort care.    Hospital day # 17 Continue comfort care measures.  Await transfer to  hospice nursing home over the next few days when available.  Continue present management.  PraAntony ContrasD  To contact Stroke Continuity provider, please refer to [email or web address], contact General Neurology

## 2020-09-04 NOTE — Progress Notes (Signed)
Palliative Medicine Inpatient Follow Up Note  Reason for consult:  Goals of Care  HPI:  Per Neurology Note --> Ms.Arbadella E Sellersis a 86 y.o.femalewith history of previous stroke, HTN, HLD, initially admitted for acute deconditioning, gen weakness post anemia/gastritis.She was an in-house code stroke when staff noted a change in mentation.CTA showed Left M2 occlusion for which she had emergent thrombectomy. Had IR-12/9 5:25 PM - Occluded Lt MCA inf division in the mid M2 seg. Delated partial reconstitution of Lt MCA from Lt ACA collaterals retrogradely. Unsuccessful attempts at revascularization due to severe prox Lt ICA tortuosity and of the aortic arch.  Today's Discussion (09/04/2020): Chart reviewed.  I met with Kenslee at bedside this morning. She is comfortable and in no distress. Her oral mucosa appears much better after a good cleansing yesterday. She did not have concerns at this time and her speech does seem a bit clearer. She requests that she continues to be offered ices cream.  In terms of a [facility] bed - there are presently none to offer therefore we will continue with comfort focused care in house.   Objective Assessment: Vital Signs Vitals:   09/04/20 0403 09/04/20 0846  BP: 136/64 (!) 150/68  Pulse: (!) 103 (!) 103  Resp: 20 18  Temp: 99.6 F (37.6 C) 98.1 F (36.7 C)  SpO2: 94% 100%    Intake/Output Summary (Last 24 hours) at 09/04/2020 1108 Last data filed at 09/03/2020 2107 Gross per 24 hour  Intake --  Output 800 ml  Net -800 ml   Last Weight  Most recent update: 08/22/2020  5:47 AM   Weight  74 kg (163 lb 2.3 oz)           Gen:  Elderly AA F HEENT: Dry mucous membranes - oral thrush CV: Regular rate and rhythm PULM: clear to auscultation bilaterally. No wheezes/rales/rhonchi ABD: soft/nontender/nondistended/normal bowel sounds EXT: No edema Neuro: Alert  SUMMARY OF RECOMMENDATIONS - DNAR/DNI - Full comfort care.  - Hopeful  for [facility] bed placement.  - Liberalized visitation per EOL policy - Ongoing PMT support  Time Spent: 15 Greater than 50% of the time was spent in counseling and coordination of care ______________________________________________________________________________________ [place] Team Team Cell Phone: [phone] Please utilize secure chat with additional questions, if there is no response within 30 minutes please call the above phone number  Palliative Medicine Team providers are available by phone from 7am to 7pm daily and can be reached through the team cell phone.  Should this patient require assistance outside of these hours, please call the patient's attending physician.

## 2020-09-04 NOTE — Progress Notes (Signed)
[employer]ufacturing [facility]) Hospital Liaison note.    [name] is unable to offer a room today. Hospice liaison will update with any change.   A Please do not hesitate to call with questions.      Thank you,     Clementeen Hoof, BSN, RN [place] (listed on AMION under [place])      [phone]

## 2020-09-05 MED ORDER — ACETAMINOPHEN 650 MG RE SUPP
650.0000 mg | RECTAL | Status: DC | PRN
  Administered 2020-09-07: 06:00:00 650 mg via RECTAL
  Filled 2020-09-05: qty 1

## 2020-09-05 MED ORDER — LORAZEPAM 2 MG/ML IJ SOLN
1.0000 mg | INTRAMUSCULAR | Status: DC | PRN
  Administered 2020-09-05 – 2020-09-09 (×2): 1 mg via INTRAVENOUS
  Filled 2020-09-05 (×2): qty 1

## 2020-09-05 MED ORDER — MORPHINE SULFATE (CONCENTRATE) 10 MG/0.5ML PO SOLN
5.0000 mg | ORAL | Status: DC | PRN
  Administered 2020-09-05 – 2020-09-06 (×2): 5 mg via SUBLINGUAL
  Filled 2020-09-05 (×2): qty 0.5

## 2020-09-05 MED ORDER — MORPHINE SULFATE (CONCENTRATE) 10 MG/0.5ML PO SOLN
5.0000 mg | ORAL | Status: DC
  Administered 2020-09-05 – 2020-09-06 (×4): 5 mg via ORAL
  Filled 2020-09-05 (×4): qty 0.5

## 2020-09-05 NOTE — Progress Notes (Signed)
STROKE TEAM PROGRESS NOTE   INTERVAL HISTORY No family at the bedside. Pt lying in bed, initially sleeping but easily open eyes on voice, able to track on the left, able to follow limited midline commands, but still expressive aphasia and right hemiplegia. On comfort care measures, waiting for hospice bed.    PHYSICAL EXAM     Temp:  [97.7 F (36.5 C)-99 F (37.2 C)] 98.3 F (36.8 C) (12/27 0823) Pulse Rate:  [91-110] 108 (12/27 0823) Resp:  [18-20] 20 (12/27 0823) BP: (121-151)/(57-80) 151/59 (12/27 0823) SpO2:  [96 %-100 %] 98 % (12/27 0823)  General -elderly African-American lady appears to be restless.  Ophthalmologic - fundi not examined due to comfort care measures  Cardiovascular - Regular rhythm and rate.  Neuro - initially sleepy but easily open eyes on voice, severe expressive aphasia and dysarthria, intangible words. Not able to answer questions, but followed eye closure and opening, but not other midline commands or peripheral commands. Eyes able to gaze bilaterally, tracking on the left visual field, however, still more left gaze preference and blinking to visual threat on the left but not on the right. Right facial droop. Tongue midline in mouth. LUE spontaneous movement, BLE and RUE no spontaneous movement. Limited muscle strength testing due to comfort care measures. Sensation, coordination not cooperative and gait not tested.   ASSESSMENT/PLAN Maria [name] is a 86 y.o. female with history of previous stroke, HTN, HLD, initially admitted for acute deconditioning, gen weakness post anemia/gastritis. She was an in-house code stroke when staff noted a change in mentation. CTA showed Left M2 occlusion for which she had emergent thrombectomy. Had IR - 12/9 5:25 PM - Occluded Lt MCA inf division in the mid M2 seg. Delated partial reconstitution of Lt MCA from Lt ACA collaterals retrogradely. Unsuccessful attempts at  revascularization due to severe prox  Lt ICA tortuosity  and of the aortic arch.  Stroke: L MCA moderate stroke due to left M2 occlusion with unsuccessful IR and in TIMELESS trial, likely cardioembolic etiology  Code Stroke CT head: hyperdensity along the left M2 MCA in the sylvian fissure  CTA head & neck Distal branch of M2 occlusion.   CT perfusion 37m with missmatch  MRI  Multiple scattered tiny strokes bilat, moderate LMCA stroke, small amt of petechial hemorrhage noted in bed of infarct. Small vessel ischemic disease and old lacunar strokes noted.   Repeat CTA unchanged left M2 occlusion  2D Echo -08/11/2020 - EF > 75%. No cardiac source of emboli identified.   LDL 50  HgbA1c 5.6  UDS - negative  SARS - negative  VTE prophylaxis - SCDs  ASA 848m(but this was recently put on hold by GI for acute gastritis and anemia) prior to admission, was on ASA 325 mg daily -> now D/C'd  Therapy recommendations:  Awaiting hospice bed.  Disposition:  Pending   Comfort care measures  Family met with Dr. SeLeonie Mannd palliative care 12/22. Opted for comfort care. DNR - Await Hospice bed (famiy prefers [facility]).   Comfort care orders in place. SL morphine prn pain.  Cortrak removed -> comfort feeds available  Unrestricted visitation   Waiting for hospice bed  Recent GIB with anemia  12/2 admission for syncope, GIB  Found to have severe anemia with Hb 6.9  Received PRBC   EGD done showed NSAID related gastritis and gastric ulcers and Cameron's erosions. Per GI at that time, hold ASA for2 weeks from admission (restart 08/24/20), continue  BID PPI for 4 months, start PO iron in 1 week.  This admission Hb 10.2->...->7.5->8.2->8.5->8.5->8.1->7.9->7.9->7.1->7.6  Iron resumed -> iron IV daily -> off  PPI IV bid -> off  AKI on CKD  Likely d/t contrast nephropathy  Cre 1.07->1.38->2.54->3.56->4.43->5.11->5.73->5.83->5.28->5.00->4.50->3.46->3.12   On IVF - NS @ 50-> LR @ 50->off   Increased urine output   Nephrology onboard  -> signed off  received albumin/lasix challenge 12/14.   TF with Glucerna @ 45 and Free water 100 ml Q 4 hrs-> off for comfort care  Hypertension  Home meds:  Toprol XL 25 mg daily, Lasix 9m   Off cleviprex  Current BP meds - metoprolol 12.5->25->12.5 bid -> now off   Hyperlipidemia  Home meds:  Lipitor 457mand Zetia 1057m Home meds resumed -> D/c meds for comfort care  LDL 50, goal < 70  Diabetes type II Controlled  Home meds:  Insulin  HgbA1c 5.6, goal < 7.0  Off SSI and CBG monitoring for comfort care  Dysphagia   Did not pass swallow or MBS 12/13  Repeat MBS 08/26/2020 - pt to remain NPO -> comfort feeds now  NGT -> cortrak - removed - comfort feeds  TF @ 45 and FW -> off   Leukocytosis / Fever  WBC's - 19.3->14.4  T Max - 99.9->100.5->97.8->99.6  UA c/w UTI - culture - >100,000 colonies E-Coli  Possible pneumonia on CXR - 12/19 - More significant opacity in the left retrocardiac region is indeterminate and could represent atelectasis or pneumonia.  Pharmacy consult for antibiotic dosing Cefepime started 12/19 -> off  Other Stroke Risk Factors  Advanced Age >/= 65   Coronary artery disease  Congestive heart failure  Other Active Problems and Findings  Incidental finding - multiple thyroid nodules, the largest within the left lobe measuring 2.3 cm.   Pt was enrolled in TIMELESS trial prior to thrombectomy.  Small right frontal scalp hematoma   Rt groin hematoma - resolving   Soft stool x 2 on 08/21/20  Labial wound: re-consult WOCN as labia is more swollen and now with weeping. Also, appreciate GYN consult for any recommendations. Cx multi flora   Hospital day # 18  JinRosalin HawkingD PhD Stroke Neurology 09/05/2020 11:55 AM   To contact Stroke Continuity provider, please refer to [email or web address], contact General Neurology

## 2020-09-05 NOTE — Progress Notes (Signed)
[facility] ([facility] Hospital Liaison Note  Unfortunately, [facility] is unable to offer a room for this patient this morning.  An [facility] Hospital Liaison will follow up with the family and TOC daily or sooner if a bed becomes available.  Please call with any hospice related questions,  Thank you,  Sherry Gibson, RN [facility] HLT (on AMION) [phone]  

## 2020-09-05 NOTE — Progress Notes (Signed)
Reached out to all family in demographics. Made aware of the changes pt has made today and the increase need for medications. Palliative RN made visit and medications were increased and ativan was added. Educated family on end of life, and that if they would like to visit today to come see her. As expected on the changes she had made today, death is more near than before or this morning. Of course, educated family that there is no way to know when they asked when she would pass. Provided education on near end of life s/s that this RN has seen throughout the day.

## 2020-09-05 NOTE — Progress Notes (Signed)
Palliative Medicine RN Note: Symptom check. RN reports Cheyne-Stokes respirations.   Pt is groaning during my visit with labored respirations. Obtained orders from Dr Hilma Favors to increase Roxanol. Will follow up tomorrow if she survives the night.  Marjie Skiff Yarethzy Croak, RN, BSN, [facility] Palliative Medicine Team 09/05/2020 3:25 PM Office [phone]

## 2020-09-06 MED ORDER — MORPHINE SULFATE (PF) 2 MG/ML IV SOLN
1.0000 mg | INTRAVENOUS | Status: DC | PRN
  Administered 2020-09-06 – 2020-09-09 (×5): 1 mg via INTRAVENOUS
  Filled 2020-09-06 (×5): qty 1

## 2020-09-06 MED ORDER — SCOPOLAMINE 1 MG/3DAYS TD PT72
1.0000 | MEDICATED_PATCH | TRANSDERMAL | Status: DC
  Administered 2020-09-06 – 2020-09-09 (×2): 1.5 mg via TRANSDERMAL
  Filled 2020-09-06 (×2): qty 1

## 2020-09-06 NOTE — Plan of Care (Signed)
  Problem: Education: Goal: Knowledge of disease or condition will improve Outcome: Not Applicable Goal: Knowledge of secondary prevention will improve Outcome: Not Applicable Goal: Knowledge of patient specific risk factors addressed and post discharge goals established will improve Outcome: Not Applicable Goal: Individualized Educational Video(s) Outcome: Not Applicable   Problem: Coping: Goal: Will verbalize positive feelings about self Outcome: Not Applicable Goal: Will identify appropriate support needs Outcome: Not Applicable   Problem: Health Behavior/Discharge Planning: Goal: Ability to manage health-related needs will improve Outcome: Not Applicable   Problem: Self-Care: Goal: Ability to participate in self-care as condition permits will improve Outcome: Not Applicable Goal: Verbalization of feelings and concerns over difficulty with self-care will improve Outcome: Not Applicable Goal: Ability to communicate needs accurately will improve Outcome: Not Applicable   Problem: Nutrition: Goal: Risk of aspiration will decrease Outcome: Not Applicable Goal: Dietary intake will improve Outcome: Not Applicable   Problem: Ischemic Stroke/TIA Tissue Perfusion: Goal: Complications of ischemic stroke/TIA will be minimized Outcome: Not Applicable   Problem: Education: Goal: Knowledge of General Education information will improve Description: Including pain rating scale, medication(s)/side effects and non-pharmacologic comfort measures Outcome: Not Applicable   Problem: Health Behavior/Discharge Planning: Goal: Ability to manage health-related needs will improve Outcome: Not Applicable   Problem: Clinical Measurements: Goal: Ability to maintain clinical measurements within normal limits will improve Outcome: Not Applicable Goal: Will remain free from infection Outcome: Not Applicable Goal: Diagnostic test results will improve Outcome: Not Applicable Goal: Respiratory  complications will improve Outcome: Not Applicable Goal: Cardiovascular complication will be avoided Outcome: Not Applicable   Problem: Activity: Goal: Risk for activity intolerance will decrease Outcome: Not Applicable   Problem: Nutrition: Goal: Adequate nutrition will be maintained Outcome: Not Applicable   Problem: Coping: Goal: Level of anxiety will decrease Outcome: Not Applicable   Problem: Elimination: Goal: Will not experience complications related to bowel motility Outcome: Not Applicable Goal: Will not experience complications related to urinary retention Outcome: Not Applicable   Problem: Pain Managment: Goal: General experience of comfort will improve Outcome: Not Applicable   Problem: Safety: Goal: Ability to remain free from injury will improve Outcome: Not Applicable   Problem: Skin Integrity: Goal: Risk for impaired skin integrity will decrease Outcome: Not Applicable

## 2020-09-06 NOTE — Progress Notes (Signed)
[place] [id number] [phone] per family request end of life arrangements have been made here.

## 2020-09-06 NOTE — Progress Notes (Signed)
Patient appears comfortable, few movements, only slight secretions. Family and neighbors in to say good byes. Patient having long periods of apnea. Oral care provided throughout shift and position changes for comfort.  Foley and rectal tube in place. MD aware.

## 2020-09-06 NOTE — Progress Notes (Signed)
Palliative Medicine RN Note: Symptom check. Note that Dr Erlinda Hong states pt not stable for transport. She is having Cheyes-Stokes respirations with apnea up to 20 seconds during my visit. PAINAD 0, no s/s distress. Will f/u tomorrow.  Marjie Skiff Yon Schiffman, RN, BSN, [facility] Palliative Medicine Team 09/06/2020 2:19 PM Office [phone]

## 2020-09-06 NOTE — Progress Notes (Signed)
Manufacturing engineer [facility])  Spoke with MD this morning about transferring patient to [facility]. As of now patient is not medically stable for transport. [facility] will follow up as needed and on patient status.   Please feel free to call [facility] with any discharge plans.  Thank you, Clementeen Hoof, BSN, RN [phone]

## 2020-09-06 NOTE — Progress Notes (Signed)
STROKE TEAM PROGRESS NOTE   INTERVAL HISTORY No family at the bedside. Pt lying in bed, not open eyes on voice, Cheyne-Stokes breathing with oral secretions. Will give Rubinol PRN and also put on scopolamine patch. Pt condition much worse than yesterday, not a good candidate for transportation at this time.  PHYSICAL EXAM     Temp:  [97.5 F (36.4 C)-99.3 F (37.4 C)] 99.3 F (37.4 C) (12/28 0908) Pulse Rate:  [107-117] 107 (12/28 0908) Resp:  [18-21] 18 (12/28 0908) BP: (113-151)/(47-62) 114/47 (12/28 0908) SpO2:  [87 %-99 %] 87 % (12/28 0908)  General -elderly African-American lady, not open eyes on voice, Cheyne-Stokes breathing with oral secretions.  Ophthalmologic - fundi not examined due to comfort care measures  Cardiovascular - Regular rhythm and rate.  Neuro - limited exam due to comfort care measures. Not open eyes on voice but with eyelid movement with voice stimulation, not following commands, nonverbal. Eyes closed, not open with voice. Cheyne-Stokes breathing with oral secretions. No spontaneous movement in all extremities.    ASSESSMENT/PLAN Maria [name] is a 86 y.o. female with history of previous stroke, HTN, HLD, initially admitted for acute deconditioning, gen weakness post anemia/gastritis. She was an in-house code stroke when staff noted a change in mentation. CTA showed Left M2 occlusion for which she had emergent thrombectomy. Had IR - 12/9 5:25 PM - Occluded Lt MCA inf division in the mid M2 seg. Delated partial reconstitution of Lt MCA from Lt ACA collaterals retrogradely. Unsuccessful attempts at  revascularization due to severe prox  Lt ICA tortuosity and of the aortic arch.  Stroke: L MCA moderate stroke due to left M2 occlusion with unsuccessful IR and in TIMELESS trial, likely cardioembolic etiology  Code Stroke CT head: hyperdensity along the left M2 MCA in the sylvian fissure  CTA head & neck Distal branch of M2 occlusion.   CT perfusion 38m  with missmatch  MRI  Multiple scattered tiny strokes bilat, moderate LMCA stroke, small amt of petechial hemorrhage noted in bed of infarct. Small vessel ischemic disease and old lacunar strokes noted.   Repeat CTA unchanged left M2 occlusion  2D Echo -08/11/2020 - EF > 75%. No cardiac source of emboli identified.   LDL 50  HgbA1c 5.6  UDS - negative  SARS - negative  VTE prophylaxis - SCDs  ASA 815m(but this was recently put on hold by GI for acute gastritis and anemia) prior to admission, was on ASA 325 mg daily -> now D/C'd  Therapy recommendations:  Awaiting hospice bed.  Disposition:  Pending   Comfort care measures  Family met with Dr. SeLeonie Mannd palliative care 12/22. Opted for comfort care. DNR - Await Hospice bed (famiy prefers [facility]).   Comfort care orders in place. SL morphine prn pain -> IV morphine PRN.  Cortrak removed -> comfort feeds available  Unrestricted visitation   On scopolamine patch and rubinol PRN  Worsening respiratory status -> not good candidate for transportation -> will remain inpatient at this time  Recent GIB with anemia  12/2 admission for syncope, GIB  Found to have severe anemia with Hb 6.9  Received PRBC   EGD done showed NSAID related gastritis and gastric ulcers and Cameron's erosions. Per GI at that time, hold ASA for2 weeks from admission (restart 08/24/20), continue BID PPI for 4 months, start PO iron in 1 week.  This admission Hb 10.2->...->7.5->8.2->8.5->8.5->8.1->7.9->7.9->7.1->7.6  Iron resumed -> iron IV daily -> off  PPI IV bid ->  off  AKI on CKD  Likely d/t contrast nephropathy  Cre 1.07->1.38->2.54->3.56->4.43->5.11->5.73->5.83->5.28->5.00->4.50->3.46->3.12   On IVF - NS @ 50-> LR @ 50->off   Increased urine output   Nephrology onboard -> signed off  received albumin/lasix challenge 12/14.   TF with Glucerna @ 45 and Free water 100 ml Q 4 hrs-> off for comfort care  Hypertension  Home meds:   Toprol XL 25 mg daily, Lasix 57m   Off cleviprex  Current BP meds - metoprolol 12.5->25->12.5 bid -> now off   Hyperlipidemia  Home meds:  Lipitor 462mand Zetia 1080m Home meds resumed -> D/c meds for comfort care  LDL 50, goal < 70  Diabetes type II Controlled  Home meds:  Insulin  HgbA1c 5.6, goal < 7.0  Off SSI and CBG monitoring for comfort care  Dysphagia   Did not pass swallow or MBS 12/13  Repeat MBS 08/26/2020 - pt to remain NPO -> comfort feeds now  NGT -> cortrak - removed - comfort feeds  TF @ 45 and FW -> off   Leukocytosis / Fever  WBC's - 19.3->14.4  T Max - 99.9->100.5->97.8->99.6  UA c/w UTI - culture - >100,000 colonies E-Coli  Possible pneumonia on CXR - 12/19 - More significant opacity in the left retrocardiac region is indeterminate and could represent atelectasis or pneumonia.  Pharmacy consult for antibiotic dosing Cefepime started 12/19 -> off  Other Stroke Risk Factors  Advanced Age >/= 65   Coronary artery disease  Congestive heart failure  Other Active Problems and Findings  Incidental finding - multiple thyroid nodules, the largest within the left lobe measuring 2.3 cm.   Pt was enrolled in TIMELESS trial prior to thrombectomy.  Small right frontal scalp hematoma   Rt groin hematoma - resolving   Soft stool x 2 on 08/21/20  Labial wound: re-consult WOCN as labia is more swollen and now with weeping. Also, appreciate GYN consult for any recommendations. Cx multi flora   Hospital day # 19 67JinRosalin HawkingD PhD Stroke Neurology 09/06/2020 10:08 AM   To contact Stroke Continuity provider, please refer to [email or web address], contact General Neurology

## 2020-09-06 NOTE — Progress Notes (Signed)
Nutrition Brief Note  Chart reviewed. Pt now transitioning to comfort care.  No further nutrition interventions warranted at this time.  Please re-consult as needed.   Rilda Bulls, MS, RD, LDN RD pager number and weekend/on-call pager number located in Amion.    

## 2020-09-07 ENCOUNTER — Ambulatory Visit: Payer: Medicare Other | Admitting: Physician Assistant

## 2020-09-07 MED ORDER — ACETAMINOPHEN 650 MG RE SUPP
650.0000 mg | Freq: Four times a day (QID) | RECTAL | Status: DC
  Administered 2020-09-07 – 2020-09-10 (×12): 650 mg via RECTAL
  Filled 2020-09-07 (×12): qty 1

## 2020-09-07 MED ORDER — GLYCOPYRROLATE 0.2 MG/ML IJ SOLN
0.4000 mg | Freq: Four times a day (QID) | INTRAMUSCULAR | Status: DC
  Administered 2020-09-07 – 2020-09-09 (×8): 0.4 mg via INTRAVENOUS
  Filled 2020-09-07 (×9): qty 2

## 2020-09-07 NOTE — Progress Notes (Signed)
Palliative Medicine RN Note: Symptom check. Pt is resting, PAINAD 0, no s/s distress. Will follow up tomorrow.  Marjie Skiff Shanese Riemenschneider, RN, BSN, [facility] Palliative Medicine Team 09/07/2020 11:15 AM Office [phone]

## 2020-09-07 NOTE — Progress Notes (Signed)
Palliative:  HPI:86 y.o.femalewith history of previous stroke, HTN, HLD, initially admitted for acute deconditioning, gen weakness post anemia/gastritis.She was an in-house code stroke when staff noted a change in mentation.CTA showed Left M2 occlusion for which she had emergent thrombectomy.Continues with poor neurological status and severe dysphagia. Full comfort care.   Maria [name] is resting comfortably. Breathing is irregular and fast at times but overall comfortably. Worsening secretions. Having fevers. Will adjust medications to ensure ongoing comfort in setting of fevers and secretions. No family currently at bedside. Discussed with RN who has no further concerns.   Exam: Resting comfortably. Unresponsive. Muscles relaxed. No distress. Breathing irregular but appear comfortable.   Plan: - Scheduled Tylenol for fever.  - Scheduled Robinul for increased secretions.  - Full comfort care.   15 min  Vinie Sill, NP Palliative Medicine Team Pager [phone] (Please see [email or web address] for schedule) Team Phone [phone]    Greater than 50%  of this time was spent counseling and coordinating care related to the above assessment and plan

## 2020-09-07 NOTE — Progress Notes (Signed)
STROKE TEAM PROGRESS NOTE   INTERVAL HISTORY No family at the bedside. I have long discussion with pt granddaughter yesterday evening at the bedside. Today pt in coma, heavy breathing with oral secretions but not in distress.    PHYSICAL EXAM     Temp:  [99.3 F (37.4 C)-101.1 F (38.4 C)] 101.1 F (38.4 C) (12/29 0808) Pulse Rate:  [84-115] 115 (12/29 0808) Resp:  [17-20] 20 (12/29 0808) BP: (97-124)/(39-57) 105/39 (12/29 0808) SpO2:  [85 %-100 %] 100 % (12/29 0808)  General -elderly African-American lady, not open eyes on voice, heavy breathing with oral secretions.  Ophthalmologic - fundi not examined due to comfort care measures  Cardiovascular - Regular rhythm and rate.  Neuro - limited exam due to comfort care measures. Not open eyes on voice but with eyelid movement with voice stimulation, not following commands, nonverbal. Eyes closed, not open with voice. Cheyne-Stokes breathing with oral secretions. No spontaneous movement in all extremities.    ASSESSMENT/PLAN [name] is a 86 y.o. female with history of previous stroke, HTN, HLD, initially admitted for acute deconditioning, gen weakness post anemia/gastritis. She was an in-house code stroke when staff noted a change in mentation. CTA showed Left M2 occlusion for which she had emergent thrombectomy. Had IR - 12/9 5:25 PM - Occluded Lt MCA inf division in the mid M2 seg. Delated partial reconstitution of Lt MCA from Lt ACA collaterals retrogradely. Unsuccessful attempts at  revascularization due to severe prox  Lt ICA tortuosity and of the aortic arch.  Stroke: L MCA moderate stroke due to left M2 occlusion with unsuccessful IR and in TIMELESS trial, likely cardioembolic etiology  Code Stroke CT head: hyperdensity along the left M2 MCA in the sylvian fissure  CTA head & neck Distal branch of M2 occlusion.   CT perfusion 25ml with missmatch  MRI  Multiple scattered tiny strokes bilat, moderate LMCA stroke,  small amt of petechial hemorrhage noted in bed of infarct. Small vessel ischemic disease and old lacunar strokes noted.   Repeat CTA unchanged left M2 occlusion  2D Echo -08/11/2020 - EF > 75%. No cardiac source of emboli identified.   LDL 50  HgbA1c 5.6  UDS - negative  SARS - negative  VTE prophylaxis - SCDs  ASA $Re'81mg'cFc$  (but this was recently put on hold by GI for acute gastritis and anemia) prior to admission, was on ASA 325 mg daily -> now D/C'd  Comfort care measures  Family met with Dr. Leonie [name] and palliative care 12/22. Opted for comfort care. DNR   Comfort care orders in place. SL morphine prn pain -> IV morphine PRN.  Cortrak removed -> comfort feeds available  Unrestricted visitation   On scopolamine patch and rubinol PRN  Continued worsening respiratory status, now fever-> not good candidate for transportation -> anticipate hospital death  Recent GIB with anemia  12/2 admission for syncope, GIB  Found to have severe anemia with Hb 6.9  Received PRBC   EGD done showed NSAID related gastritis and gastric ulcers and Cameron's erosions. Per GI at that time, hold ASA for2 weeks from admission (restart 08/24/20), continue BID PPI for 4 months, start PO iron in 1 week.  This admission Hb 10.2->...->7.5->8.2->8.5->8.5->8.1->7.9->7.9->7.1->7.6  Iron resumed -> iron IV daily -> off  PPI IV bid -> off  AKI on CKD  Likely d/t contrast nephropathy  Cre 1.07->1.38->2.54->3.56->4.43->5.11->5.73->5.83->5.28->5.00->4.50->3.46->3.12   On IVF - NS @ 50-> LR @ 50->off   Increased urine output   Nephrology  onboard -> signed off  received albumin/lasix challenge 12/14.   TF with Glucerna @ 45 and Free water 100 ml Q 4 hrs-> off for comfort care  Hypertension  Home meds:  Toprol XL 25 mg daily, Lasix 93m   Off cleviprex  Current BP meds - metoprolol 12.5->25->12.5 bid -> now off   Hyperlipidemia  Home meds:  Lipitor 433mand Zetia 1043m Home meds resumed  -> D/c meds for comfort care  LDL 50, goal < 70  Diabetes type II Controlled  Home meds:  Insulin  HgbA1c 5.6, goal < 7.0  Off SSI and CBG monitoring for comfort care  Dysphagia   Did not pass swallow or MBS 12/13  Repeat MBS 08/26/2020 - pt to remain NPO -> comfort feeds now  NGT -> cortrak - removed - comfort feeds  TF @ 45 and FW -> off   Leukocytosis / Fever  WBC's - 19.3->14.4  T Max - 101.1  UA c/w UTI - culture - >100,000 colonies E-Coli  Possible pneumonia on CXR - 12/19 - More significant opacity in the left retrocardiac region is indeterminate and could represent atelectasis or pneumonia.  Pharmacy consult for antibiotic dosing Cefepime started 12/19 -> off  Other Stroke Risk Factors  Advanced Age >/= 65   Coronary artery disease  Congestive heart failure  Other Active Problems and Findings  Incidental finding - multiple thyroid nodules, the largest within the left lobe measuring 2.3 cm.   Pt was enrolled in TIMELESS trial prior to thrombectomy.  Small right frontal scalp hematoma   Rt groin hematoma - resolving   Soft stool x 2 on 08/21/20  Labial wound: consult WOCN. Cx multi flora  Hospital day # 20   Maria [name] Stroke Neurology 09/07/2020 9:04 AM   To contact Stroke Continuity provider, please refer to [email or web address], contact General Neurology

## 2020-09-08 ENCOUNTER — Ambulatory Visit: Payer: Self-pay

## 2020-09-08 ENCOUNTER — Telehealth: Payer: Medicare Other

## 2020-09-08 DIAGNOSIS — N183 Chronic kidney disease, stage 3 unspecified: Secondary | ICD-10-CM

## 2020-09-08 NOTE — Progress Notes (Signed)
Palliative Medicine RN Note: Symptom check. Resp 24-26/minute. I spoke with RN, who will bring a dose of morphine. PMT will continue to follow.  Marjie Skiff Cliff Damiani, RN, BSN, [facility] Palliative Medicine Team 09/08/2020 2:13 PM Office [phone]

## 2020-09-08 NOTE — Chronic Care Management (AMB) (Signed)
Chronic Care Management    Social Work Follow Up Note  09/08/2020 Name: Maria [name] MRN: [id number] DOB: Feb 25, 1926  Maria [name] is a 86 y.o. year old female who is a primary care patient of Glendale Chard, MD. The CCM team was consulted for assistance with care coordination.   Review of patient status, including review of consultants reports, other relevant assessments, and collaboration with appropriate care team members and the patient's provider was performed as part of comprehensive patient evaluation and provision of chronic care management services.    SW scheduled to contact patient today for ongoing care coordination needs. Upon chart review it is noted patient is currently inpatient following a recent stroke. The patient is comfort care with anticipated discharge to [facility] once a bed is available. SW collaboration with embedded chronic care management team to inform of patients current disposition. SW to perform a case closure.    Facility-Administered Encounter Medications as of 09/08/2020  Medication  . acetaminophen (TYLENOL) suppository 650 mg  . antiseptic oral rinse (BIOTENE) solution 15 mL  . Chlorhexidine Gluconate Cloth 2 % PADS 6 each  . Gerhardt's butt cream  . glycopyrrolate (ROBINUL) injection 0.4 mg  . haloperidol (HALDOL) tablet 0.5 mg   Or  . haloperidol (HALDOL) 2 MG/ML solution 0.5 mg   Or  . haloperidol lactate (HALDOL) injection 0.5 mg  . LORazepam (ATIVAN) injection 1 mg  . MEDLINE mouth rinse  . morphine 2 MG/ML injection 1 mg  . ondansetron (ZOFRAN-ODT) disintegrating tablet 4 mg   Or  . ondansetron (ZOFRAN) injection 4 mg  . polyvinyl alcohol (LIQUIFILM TEARS) 1.4 % ophthalmic solution 1 drop  . scopolamine (TRANSDERM-SCOP) 1 MG/3DAYS 1.5 mg   Outpatient Encounter Medications as of 09/08/2020  Medication Sig Note  . Accu-Chek FastClix Lancets MISC CHECK BLOOD SUGAR BEFORE BREAKFAST AND DINNER   . ACCU-CHEK SMARTVIEW test strip  TEST TWICE DAILY BEFORE BREAKFAST AND DINNER   . acetaminophen (TYLENOL) 500 MG tablet Take 1 tablet (500 mg total) by mouth every 4 (four) hours as needed for mild pain or moderate pain (or Fever >/= 101).   Marland Kitchen allopurinol (ZYLOPRIM) 100 MG tablet Take 2 tablets (200 mg total) by mouth daily.   Marland Kitchen aspirin EC 81 MG tablet Take 1 tablet (81 mg total) by mouth daily.   Marland Kitchen atorvastatin (LIPITOR) 40 MG tablet Take 1 tablet (40 mg total) by mouth daily. Monday, Wednesday, and Friday   . Cholecalciferol (VITAMIN D) 50 MCG (2000 UT) tablet Take 2,000 Units by mouth daily.   . citalopram (CELEXA) 20 MG tablet Take 20 mg by mouth daily.    Marland Kitchen docusate sodium (COLACE) 100 MG capsule Take 100 mg by mouth 2 (two) times daily.   Marland Kitchen donepezil (ARICEPT) 10 MG tablet TAKE 1 TABLET DAILY IN THE EVENING (Patient taking differently: Take 10 mg by mouth at bedtime. )   . ezetimibe (ZETIA) 10 MG tablet TAKE 1 TABLET AT BEDTIME (Patient taking differently: Take 10 mg by mouth daily. )   . ferrous sulfate 325 (65 FE) MG EC tablet Take 1 tablet (325 mg total) by mouth 2 (two) times daily. (Patient taking differently: Take 325 mg by mouth daily. )   . FOLBIC 2.5-25-2 MG TABS tablet TAKE 1 TABLET DAILY (Patient taking differently: Take 1 tablet by mouth daily. )   . furosemide (LASIX) 40 MG tablet Take 1 tablet (40 mg total) by mouth daily.   . hydroxychloroquine (PLAQUENIL) 200 MG tablet Take  1 tablet (200 mg total) by mouth daily.   . Insulin Pen Needle (BD PEN NEEDLE MICRO U/F) 32G X 6 MM MISC Use as directed   . Menthol-Methyl Salicylate (MUSCLE RUB) 10-15 % CREA Apply 1 application topically as needed for muscle pain.   . metoprolol succinate (TOPROL-XL) 25 MG 24 hr tablet TAKE 1 TABLET(25 MG) BY MOUTH DAILY (Patient taking differently: Take 25 mg by mouth daily. ) 08/17/2020: Given @ [facility] on 08/27/2020  . pantoprazole (PROTONIX) 40 MG tablet Take 1 tablet (40 mg total) by mouth 2 (two) times daily before a meal.   . telmisartan  (MICARDIS) 20 MG tablet Take 1 tablet (20 mg total) by mouth daily.      Goals Addressed              This Visit's Progress     Patient Stated   .  COMPLETED: "I don't know what food I can eat" (pt-stated)        Current Barriers:  Marland Kitchen Multiple conditions impacted by diet choices including DM II, CKD III, HTN, and Gout . Limited knowledge of specific foods okay to eat and/or avoid in relation to comorbid conditions   RN Clinical Goal(s):  Marland Kitchen Over the next 180 days, patient will continue to work with the CCM team and PCP for disease education and support for improved Self health management of DM  CCM RN CM Interventions:  07/05/20 call completed with patient  . Evaluation of current treatment plan related to dietary recommendations for DM and patient's adherence to plan as established by provider . Reviewed recent A1c has decreased to 6.0 from 8.1; Positive reinforcement given to patient for making efforts to lower her A1c  . Re-educated patient re: dietary recommendations and exercise recommendations; Educated on 15'15' rule  . Re-educated patient with rationale, to check cbg daily before meals and record, calling the CCM team and or PCP for findings outside established parameters FBS 80-130, <180 after meals  . Confirmed patient received and reviewed the printed educational materials related to Meal Planning Using the Plate Method and Portion Control; 6 Ways to be Water Wise; Elderly Nutrition 101 . Discussed plans with patient for ongoing care management follow up and provided patient with direct contact information for care management team  Patient Self Care Activities:  . Attends all scheduled provider appointments . Performs ADL's independently . Calls provider office for new concerns or questions . Unable to verbalize appropriate diet restrictions in relation to health conditions  Please see past updates related to this goal by clicking on the "Past Updates" button in the  selected goal      .  COMPLETED: "I have Congestive Heart Failue" (pt-stated)        Current Barriers:  Marland Kitchen Knowledge Deficits related to disease process and Self health management of CHF . Chronic Disease Management support and education needs related to DM II with Stage III CKD, Hypertensive Nephropathy, Mixed Hyperlipidemia  Nurse Case Manager Clinical Goal(s):  . New 06/17/20 Over the next 180 days, patient will continue to work with the CCM team and PCP for disease education and support for improved Self health management of CHF  CCM RN CM Interventions:  06/17/20 call completed with patient . Evaluation of current treatment plan related to CHF and patient's adherence to plan as established by provider . Determined patient continues to have her CHF managed by Dr. Gwenlyn Found, Cardiologist, discussed recent OV f/u: o ASSESSMENT AND PLAN:  o Essential hypertension o  History of essential hypertension with blood pressure measured today 120/62.  She is on amlodipine, hydralazine and metoprolol as well as Micardis. o Bilateral lower extremity edema o History of bilateral lower extreme edema probably related to diastolic dysfunction on oral diuretics.  She has 1-2+ left ankle edema today. o Hyperlipidemia o History of hyperlipidemia on statin therapy with lipid profile performed 12/29/2019 revealing total cholesterol 165, LDL 71 and HDL of 78. o Acute on chronic diastolic CHF (congestive heart failure) (HCC) o History of chronic diastolic heart failure with normal LV systolic function by echo recently checked 04/19/2020 oral diuretics. Marland Kitchen  Re-educated patient with rationale, to weigh daily and record, calling the CCM team and MD [facility] or Gwenlyn Found) for weight gain of 3lbs overnight or 5 pounds in a week . Discussed plans with patient for ongoing care management follow up and provided patient with direct contact information for care management team  Patient Self Care Activities:  . Self administers  medications as prescribed . Attends all scheduled provider appointments . Calls pharmacy for medication refills . Calls provider office for new concerns or questions  Please see past updates related to this goal by clicking on the "Past Updates" button in the selected goal      .  COMPLETED: "to get my gout under better control" (pt-stated)        CARE PLAN ENTRY (see longitudinal plan of care for additional care plan information)  Current Barriers:  Marland Kitchen Knowledge Deficits related to disease process and Self health management  . Chronic Disease Management support and education needs related to DM II with Stage III CKD, Hypertensive Nephropathy, Mixed Hyperlipidemia  Nurse Case Manager Clinical Goal(s):  Marland Kitchen Over the next 90 days, patient will work with the CCM team and PCP to address needs related to disease education and support for improved Self health management of Gout   CCM RN CM Interventions:  06/17/20 call completed with patient  . Inter-disciplinary care team collaboration (see longitudinal plan of care) . Evaluation of current treatment plan related to Gout and patient's adherence to plan as established by provider . Reviewed medications with patient and discussed patient is taking allopurinol 200 mg p.o.daily as directed for increased Uric Acid  . Discussed plans with patient for ongoing care management follow up and provided patient with direct contact information for care management team . Provided patient with printed educational materials related to Gout   Patient Self Care Activities:  . Self administers medications as prescribed . Attends all scheduled provider appointments . Calls pharmacy for medication refills . Calls provider office for new concerns or questions  Initial goal documentation     .  COMPLETED: "to improve my kidney function" (pt-stated)        CARE PLAN ENTRY (see longitudinal plan of care for additional care plan information)  Current Barriers:   Marland Kitchen Knowledge Deficits related to disease process and Self Health management of CKD  . Chronic Disease Management support and education needs related to DM II with Stage III CKD, Hypertensive Nephropathy, Mixed Hyperlipidemia, Gout   Nurse Case Manager Clinical Goal(s):  Marland Kitchen Over the next 180 days, patient will work with the CCM team and PCP to address needs related to disease education and support for improved Self Health management of CKD  CCM RN CM Interventions:  07/05/20 call completed with patient  . Inter-disciplinary care team collaboration (see longitudinal plan of care) . Evaluation of current treatment plan related to CKD and patient's adherence to  plan as established by provider . Educated patient on current GFR <21; Re-educated on stages of CKD; Re-educated on ways to improve renal function and importance of ongoing monitoring of this condition; Educated on importance of increasing water intake  . Reviewed medications with patient and discussed potential for medication induced renal impairment, especially with increased use of Lasix . Discussed plans with patient for ongoing care management follow up and provided patient with direct contact information for care management team . Provided patient with printed educational materials related to Diabetes and Kidney Disease; The Best Salt Substitute for Kidney Patient's; Eating Right for Chronic Kidney Disease   Patient Self Care Activities:  . Self administers medications as prescribed . Attends all scheduled provider appointments . Calls pharmacy for medication refills . Calls provider office for new concerns or questions  Initial goal documentation       Other   .  COMPLETED: Assist with care coordination by conducting social determinants of health screen        Current Barriers:  Marland Kitchen Knowledge Barriers related to resources and support available to address needs related to Chronic Care Management and challenges surrounding Social  Determinants of Health  Clinical Social Work Clinical Goal(s):   Over the next 20 days, the patient will understand the role of the CCM team and work with SW to complete SDOH (Social Determinants of Health) screen. Completed  New 06/29/2019 Over the next 45 days the patient will work with SW to review SDOH and follow up with identified resource needs  CCM SW Interventions: Completed 08/13/2019  Outbound call placed to review SDOH screening tool and assist with any current challenges  No challenges identified during today's encounter  Reviewed patient decision to decline referral to meals on wheels program at this time due to continued support from her neighbors providing meals  Encouraged the patient to eat regular meals to assist with diabetes management  Discussed plan to contact the patient over the next 60 days to assist with care coordination needs  Patient Self Care Activities:   Calls provider office with new concerns  Performs ADL's independently  See past updates    .  COMPLETED: Diabetes Management        CARE PLAN ENTRY  Current Barriers:  . Diabetes: Uncontrolled; complicated by chronic medical conditions including hypertension, hyperlipidemia, CKD Lab Results  Component Value Date   HGBA1C 8.1 (H) 03/07/2020 .   Lab Results  Component Value Date   CREATININE 1.52 (H) 01/27/2020   CREATININE 1.60 (H) 01/06/2020   CREATININE 1.80 (H) 11/26/2019   . Current antihyperglycemic regimen: Toujeo 17 units daily, Ozempic 0.5mg  weekly . Current blood glucose readings:  o Recent FBG Readings: 92,91,114,109,89,94,100,87,88,114,86,93,100 o Recent pre-meal BG readings (around 4pm before Toujeo dose): [phone] Pharmacist Clinical Goal(s):  Marland Kitchen Over the next 90 days, patient will work with PharmD and primary care provider to control blood sugar.  Interventions: . Reviewed blood glucose readings from past 2 weeks . Continue  Ozempic 0.5mg  once weekly . Continue Toujeo to 17 units daily  . Provided dietary and exercise recommendations  Patient Self Care Activities:  . Patient will check blood glucose twice daily, document, and provide to PharmD in 21 days . Patient will administer 17 units of Toujeo daily and Ozempic 0.5mg  once weekly . Patient will take medications as prescribed . Patient will contact provider with any episodes of hypoglycemia . Patient will report any questions or concerns to provider   Please see past updates related  to this goal by clicking on the "Past Updates" button in the selected goal      .  COMPLETED: Management of Anemia        CARE PLAN ENTRY (see longitudinal plan of care for additional care plan information)  Current Barriers:  . Chronic Disease Management support, education, and care coordination needs related to  Anemia   Anemia . Pharmacist Clinical Goal(s) o Over the next 90 days, patient will work with PharmD and providers to manage anemia  . Current regimen:  o Ferrous sulfate 325mg  twice daily . Interventions: o Patient is currently only taking ferrous sulfate once daily o Patient mentioned not feeling well and constipation since starting ferrous sulfate o Recommend patient increase docusate to 100mg  twice daily o Discussed there are alternative iron formulations available if patient cannot tolerate ferrous sulfate o Will provide further counseling at next follow up . Patient self care activities - Over the next 30 days, patient will: o Take docusate 100mg  twice daily o Continue to drink at least 64 ounces of water daily  Initial goal documentation     .  COMPLETED: Pharmacy Care Plan        CARE PLAN ENTRY (see longitudinal plan of care for additional care plan information)  Current Barriers:  . Chronic Disease Management support, education, and care coordination needs related to Hypertension, Hyperlipidemia, Diabetes, and Heart Failure   Hypertension BP  Readings from Last 3 Encounters:  06/13/20 112/66  06/07/20 111/67  06/02/20 110/74   . Pharmacist Clinical Goal(s): o Over the next 90 days, patient will work with PharmD and providers to maintain BP goal <130/80 . Current regimen:  o Amlodipine 5mg  1/2 tablet daily o Telmisartan 80mg  daily . Interventions: o Dietary recommendations provided (limit salt intake) o Recommend patient check blood pressure more consistently . Patient self care activities - Over the next 90 days, patient will: o Check BP 1-2 times weekly and if symptomatic, document, and provide at future appointments o Ensure daily salt intake < 2300 mg/day  Hyperlipidemia Lab Results  Component Value Date/Time   LDLCALC 71 01/06/2020 12:03 PM   . Pharmacist Clinical Goal(s): o Over the next 90 days, patient will work with PharmD and providers to maintain LDL goal < 70 . Current regimen:  o Atorvastatin 40mg  three times weekly o Ezetimibe 10mg  daily . Interventions: o Provided dietary and exercise recommendations o Discussed cholesterol levels are great . Patient self care activities - Over the next 90 days, patient will: o Take cholesterol medication as directed o Exercise as able  Diabetes Lab Results  Component Value Date/Time   HGBA1C 6.0 (H) 06/13/2020 10:41 AM   HGBA1C 8.1 (H) 03/07/2020 11:01 AM   . Pharmacist Clinical Goal(s): o Over the next 90 days, patient will work with PharmD and providers to maintain A1c goal <7% . Current regimen:  o Ozempic 0.5 mg once weekly  . Interventions: o Discussed current diabetes treatment regimen o Reviewed recent blood sugar readings o Assessed for symptoms of low blood sugar, patient denies o Discussed eating small meals/portions when appetite is low o Will eventually decrease insulin as patient's blood sugar comes down after completing prednisone . Patient self care activities - Over the next 90 days, patient will: o Check blood sugar twice daily, document,  and provide at future appointments o Contact provider with any episodes of hypoglycemia o Stop taking Toujeo 17 units o Continue taking Ozempic 0.5 mg once a week   Heart Failure .  Pharmacist Clinical Goal(s) o Over the next 90 days, patient will work with PharmD and providers to reduce symptoms associated with heart failure . Current regimen:   Furosemide 40mg  twice daily   Hydralazine 25mg  three times daily  Isosorbide mononitrate ER 30mg  daily  Metolazone 2.5mg  every other day prior to morning Lasix dose  Metoprolol Succinate XL 25mg  daily  Potassium 44mEq twice daily . Interventions: o Encouraged patient to exercise as able . Patient self care activities - Over the next 90 days, patient will: o Weigh daily and record to check for fluid buildup o Notify PCP of significant shortness of breath  Medication management . Pharmacist Clinical Goal(s): o Over the next 90 days, patient will work with PharmD and providers to achieve optimal medication adherence . Current pharmacy: [facility] . Interventions o Comprehensive medication review performed. o Continue current medication management strategy o Continue Docusate twice daily and Dulcolax as needed for constipation . Patient self care activities - Over the next 90 days, patient will: o Focus on medication adherence by continued use of pillbox and calendar reminders o Take medications as prescribed o Report any questions or concerns to PharmD and/or provider(s)  Please see past updates related to this goal by clicking on the "Past Updates" button in the selected goal          Follow Up Plan: No follow up planned at this time. SW has performed a case closure and cancelled future CCM appointments.   Daneen Schick, BSW, CDP Social Worker, Certified Dementia Practitioner [facility] [phone]

## 2020-09-08 NOTE — Progress Notes (Signed)
STROKE TEAM PROGRESS NOTE   INTERVAL HISTORY No family at the bedside. Pt lying in bed, has intermittent pause of breathing. On morphine PRN.     PHYSICAL EXAM     Temp:  [100.7 F (38.2 C)] 100.7 F (38.2 C) (12/29 2337) Pulse Rate:  [146] 146 (12/29 2337) Resp:  [21] 21 (12/29 2337) BP: (66)/(48) 66/48 (12/29 2337) SpO2:  [95 %] 95 % (12/29 2337)  General -elderly African-American lady, not open eyes on voice, intermittent pause of breathing.  Ophthalmologic - fundi not examined due to comfort care measures  Cardiovascular - Regular rhythm and rate.  Neuro - limited exam due to comfort care measures. Not open eyes on voice but with eyelid movement with voice stimulation, not following commands, nonverbal. Eyes closed, not open with voice. Cheyne-Stokes breathing with oral secretions. No spontaneous movement in all extremities.    ASSESSMENT/PLAN Ms. JOSLYNNE KLATT is a 86 y.o. female with history of previous stroke, HTN, HLD, initially admitted for acute deconditioning, gen weakness post anemia/gastritis. She was an in-house code stroke when staff noted a change in mentation. CTA showed Left M2 occlusion for which she had emergent thrombectomy. Had IR - 12/9 5:25 PM - Occluded Lt MCA inf division in the mid M2 seg. Delated partial reconstitution of Lt MCA from Lt ACA collaterals retrogradely. Unsuccessful attempts at  revascularization due to severe prox  Lt ICA tortuosity and of the aortic arch.  Stroke: L MCA moderate stroke due to left M2 occlusion with unsuccessful IR and in TIMELESS trial, likely cardioembolic etiology  Code Stroke CT head: hyperdensity along the left M2 MCA in the sylvian fissure  CTA head & neck Distal branch of M2 occlusion.   CT perfusion 49m with missmatch  MRI  Multiple scattered tiny strokes bilat, moderate LMCA stroke, small amt of petechial hemorrhage noted in bed of infarct. Small vessel ischemic disease and old lacunar strokes noted.   Repeat  CTA unchanged left M2 occlusion  2D Echo -08/11/2020 - EF > 75%. No cardiac source of emboli identified.   LDL 50  HgbA1c 5.6  UDS - negative  SARS - negative  VTE prophylaxis - SCDs  ASA 846m(but this was recently put on hold by GI for acute gastritis and anemia) prior to admission, was on ASA 325 mg daily -> now D/C'd  Comfort care measures  Family met with Dr. SeLeonie Mannd palliative care 12/22. Opted for comfort care. DNR   Comfort care orders in place. SL morphine prn pain -> IV morphine PRN.  Cortrak removed -> comfort feeds available  Unrestricted visitation   On scopolamine patch and rubinol PRN  Continued worsening respiratory status, now fever-> not good candidate for transportation -> anticipate hospital death  Recent GIB with anemia  12/2 admission for syncope, GIB  Found to have severe anemia with Hb 6.9  Received PRBC   EGD done showed NSAID related gastritis and gastric ulcers and Cameron's erosions. Per GI at that time, hold ASA for2 weeks from admission (restart 08/24/20), continue BID PPI for 4 months, start PO iron in 1 week.  This admission Hb 10.2->...->7.5->8.2->8.5->8.5->8.1->7.9->7.9->7.1->7.6  Iron resumed -> iron IV daily -> off  PPI IV bid -> off  AKI on CKD  Likely d/t contrast nephropathy  Cre 1.07->1.38->2.54->3.56->4.43->5.11->5.73->5.83->5.28->5.00->4.50->3.46->3.12   On IVF - NS @ 50-> LR @ 50->off   Increased urine output   Nephrology onboard -> signed off  received albumin/lasix challenge 12/14.   TF with Glucerna @ 45 and  Free water 100 ml Q 4 hrs-> off for comfort care  Hypertension  Home meds:  Toprol XL 25 mg daily, Lasix 7m   Off cleviprex  Current BP meds - metoprolol 12.5->25->12.5 bid -> now off   Hyperlipidemia  Home meds:  Lipitor 468mand Zetia 1030m Home meds resumed -> D/c meds for comfort care  LDL 50, goal < 70  Diabetes type II Controlled  Home meds:  Insulin  HgbA1c 5.6, goal <  7.0  Off SSI and CBG monitoring for comfort care  Dysphagia   Did not pass swallow or MBS 12/13  Repeat MBS 08/26/2020 - pt to remain NPO -> comfort feeds now  NGT -> cortrak - removed - comfort feeds  TF @ 45 and FW -> off   Leukocytosis / Fever  WBC's - 19.3->14.4  T Max - 101.1  UA c/w UTI - culture - >100,000 colonies E-Coli  Possible pneumonia on CXR - 12/19 - More significant opacity in the left retrocardiac region is indeterminate and could represent atelectasis or pneumonia.  Pharmacy consult for antibiotic dosing Cefepime started 12/19 -> off  Other Stroke Risk Factors  Advanced Age >/= 65   Coronary artery disease  Congestive heart failure  Other Active Problems and Findings  Incidental finding - multiple thyroid nodules, the largest within the left lobe measuring 2.3 cm.   Pt was enrolled in TIMELESS trial prior to thrombectomy.  Small right frontal scalp hematoma   Rt groin hematoma - resolving   Soft stool x 2 on 08/21/20  Labial wound: consult WOCN. Cx multi flora  Hospital day # 21   JinRosalin HawkingD PhD Stroke Neurology 09/08/2020 2:23 PM   To contact Stroke Continuity provider, please refer to [email or web address], contact General Neurology

## 2020-09-09 LAB — GLUCOSE, CAPILLARY: Glucose-Capillary: 146 mg/dL — ABNORMAL HIGH (ref 70–99)

## 2020-09-09 MED ORDER — MORPHINE SULFATE (PF) 2 MG/ML IV SOLN
2.0000 mg | INTRAVENOUS | Status: DC | PRN
  Administered 2020-09-10 (×2): 2 mg via INTRAVENOUS
  Filled 2020-09-09 (×2): qty 1

## 2020-09-09 MED ORDER — MORPHINE SULFATE (PF) 2 MG/ML IV SOLN: 1.0000 mg | INTRAVENOUS | Status: DC

## 2020-09-09 MED ORDER — GLYCOPYRROLATE 0.2 MG/ML IJ SOLN
0.4000 mg | Freq: Four times a day (QID) | INTRAMUSCULAR | Status: DC
  Administered 2020-09-09 – 2020-09-10 (×5): 0.4 mg via INTRAVENOUS
  Filled 2020-09-09 (×5): qty 2

## 2020-09-09 MED ORDER — MORPHINE SULFATE (PF) 2 MG/ML IV SOLN
2.0000 mg | INTRAVENOUS | Status: DC
  Administered 2020-09-09 – 2020-09-10 (×7): 2 mg via INTRAVENOUS
  Filled 2020-09-09 (×8): qty 1

## 2020-09-09 NOTE — Progress Notes (Signed)
STROKE TEAM PROGRESS NOTE   INTERVAL HISTORY No family at bedside.  Patient lying on her right side, still has heavy breathing with oral secretions, received a multiple morphine dose today.  PHYSICAL EXAM     Temp:  [97.7 F (36.5 C)-102.4 F (39.1 C)] 97.7 F (36.5 C) (12/31 1216) Pulse Rate:  [80-87] 80 (12/31 1216) Resp:  [16-22] 16 (12/31 1216) BP: (59-78)/(30-37) 78/32 (12/31 1216) SpO2:  [87 %-92 %] 92 % (12/30 2316)  General -elderly African-American lady, not open eyes on voice, intermittent pause of breathing.  Ophthalmologic - fundi not examined due to comfort care measures  Cardiovascular - Regular rhythm and rate.  Neuro - limited exam due to comfort care measures. Not open eyes on voice but with eyelid movement with voice stimulation, not following commands, nonverbal. Eyes closed, not open with voice. Cheyne-Stokes breathing with oral secretions. No spontaneous movement in all extremities.    ASSESSMENT/PLAN Maria [name] is a 86 y.o. female with history of previous stroke, HTN, HLD, initially admitted for acute deconditioning, gen weakness post anemia/gastritis. She was an in-house code stroke when staff noted a change in mentation. CTA showed Left M2 occlusion for which she had emergent thrombectomy. Had IR - 12/9 5:25 PM - Occluded Lt MCA inf division in the mid M2 seg. Delated partial reconstitution of Lt MCA from Lt ACA collaterals retrogradely. Unsuccessful attempts at  revascularization due to severe prox  Lt ICA tortuosity and of the aortic arch.  Stroke: L MCA moderate stroke due to left M2 occlusion with unsuccessful IR and in TIMELESS trial, likely cardioembolic etiology  Code Stroke CT head: hyperdensity along the left M2 MCA in the sylvian fissure  CTA head & neck Distal branch of M2 occlusion.   CT perfusion 87m with missmatch  MRI  Multiple scattered tiny strokes bilat, moderate LMCA stroke, small amt of petechial hemorrhage noted in bed of  infarct. Small vessel ischemic disease and old lacunar strokes noted.   Repeat CTA unchanged left M2 occlusion  2D Echo -08/11/2020 - EF > 75%. No cardiac source of emboli identified.   LDL 50  HgbA1c 5.6  UDS - negative  SARS - negative  VTE prophylaxis - SCDs  ASA 823m(but this was recently put on hold by GI for acute gastritis and anemia) prior to admission, was on ASA 325 mg daily -> now D/C'd  Comfort care measures  Family met with Dr. SeLeonie Mannd palliative care 12/22. Opted for comfort care. DNR   Comfort care orders in place. SL morphine prn pain -> IV morphine PRN.  Cortrak removed -> comfort feeds available  Unrestricted visitation   On scopolamine patch and robinol PRN  Continued worsening now fever, hypotension and tachypnea -> anticipate hospital death  Re-evaluation in am, consider morphine drip if needed for comfort  Recent GIB with anemia  12/2 admission for syncope, GIB  Found to have severe anemia with Hb 6.9  Received PRBC   EGD done showed NSAID related gastritis and gastric ulcers and Cameron's erosions. Per GI at that time, hold ASA for2 weeks from admission (restart 08/24/20), continue BID PPI for 4 months, start PO iron in 1 week.  This admission Hb 10.2->...->7.5->8.2->8.5->8.5->8.1->7.9->7.9->7.1->7.6  Iron resumed -> iron IV daily -> off  PPI IV bid -> off  AKI on CKD  Likely d/t contrast nephropathy  Cre 1.07->1.38->2.54->3.56->4.43->5.11->5.73->5.83->5.28->5.00->4.50->3.46->3.12   On IVF - NS @ 50-> LR @ 50->off   Increased urine output   Nephrology onboard ->  signed off  received albumin/lasix challenge 12/14.   TF with Glucerna @ 45 and Free water 100 ml Q 4 hrs-> off for comfort care  Hypertension  Home meds:  Toprol XL 25 mg daily, Lasix 64m   Off cleviprex  Current BP meds - metoprolol 12.5->25->12.5 bid -> now off   Hyperlipidemia  Home meds:  Lipitor 453mand Zetia 1039m Home meds resumed -> D/c meds  for comfort care  LDL 50, goal < 70  Diabetes type II Controlled  Home meds:  Insulin  HgbA1c 5.6, goal < 7.0  Off SSI and CBG monitoring for comfort care  Dysphagia   Did not pass swallow or MBS 12/13  Repeat MBS 08/26/2020 - pt to remain NPO -> comfort feeds now  NGT -> cortrak - removed - comfort feeds  TF @ 45 and FW -> off   Leukocytosis / Fever  WBC's - 19.3->14.4  T Max - 102.4  UA c/w UTI - culture - >100,000 colonies E-Coli  Possible pneumonia on CXR - 12/19 - More significant opacity in the left retrocardiac region is indeterminate and could represent atelectasis or pneumonia.  Pharmacy consult for antibiotic dosing Cefepime started 12/19 -> off  Other Stroke Risk Factors  Advanced Age >/= 65   Coronary artery disease  Congestive heart failure  Other Active Problems and Findings  Incidental finding - multiple thyroid nodules, the largest within the left lobe measuring 2.3 cm.   Pt was enrolled in TIMELESS trial prior to thrombectomy.  Small right frontal scalp hematoma   Rt groin hematoma - resolving   Soft stool x 2 on 08/21/20  Labial wound: consult WOCN. Cx multi flora  Hospital day # 22   JinRosalin HawkingD PhD Stroke Neurology 09/09/2020 1:53 PM   To contact Stroke Continuity provider, please refer to [email or web address], contact General Neurology

## 2020-09-09 NOTE — Progress Notes (Signed)
Palliative Medicine RN Note: Symptom check.   Pt has gotten several doses of morphine today. She is on her side, and her breathing seems somewhat labored. I will ask PMT NP Elmo Putt to come by the room and adjust her medications.  Marjie Skiff Maria Whitcher, RN, BSN, [facility] Palliative Medicine Team 09/09/2020 3:24 PM Office [phone]

## 2020-09-09 NOTE — Progress Notes (Signed)
Palliative:  HPI:86 y.o.femalewith history of previous stroke, HTN, HLD, initially admitted for acute deconditioning, gen weakness post anemia/gastritis.She was an in-house code stroke when staff noted a change in mentation.CTA showed Left M2 occlusion for which she had emergent thrombectomy.Continues with poor neurological status and severe dysphagia. Full comfort care.   Maria [name] is lying on her side. Appears mostly comfortable but with increased secretions. No labored breathing. Breathing is irregular. Has had increasing discomfort and less response to morphine. Will adjust dosage to better ensure comfort. No family or visitors at bedside.   Exam: Lying on side. Unresponsive. Breathing irregular and tachypnea at times. Abd soft, flat.   Plan: - Full comfort care.  - Increase morphine and add scheduled dosing for pain/discomfort.  - Increase Robinul.   15 min  Vinie Sill, NP Palliative Medicine Team Pager [phone] (Please see [email or web address] for schedule) Team Phone [phone]    Greater than 50%  of this time was spent counseling and coordinating care related to the above assessment and plan

## 2020-09-10 DEATH — deceased

## 2020-09-13 ENCOUNTER — Ambulatory Visit: Payer: Medicare Other | Admitting: Internal Medicine

## 2020-09-15 ENCOUNTER — Telehealth: Payer: Self-pay

## 2020-09-16 ENCOUNTER — Ambulatory Visit: Payer: Medicare Other | Admitting: General Practice

## 2020-09-27 ENCOUNTER — Telehealth: Payer: Medicare Other

## 2020-10-06 ENCOUNTER — Other Ambulatory Visit: Payer: Self-pay | Admitting: Cardiovascular Disease

## 2020-10-11 NOTE — Progress Notes (Addendum)
STROKE TEAM PROGRESS NOTE   INTERVAL HISTORY  No overnight events or new complaints. Family at bedside stating patient responding to PRN morphine and seems comfortable.  PHYSICAL EXAM     Temp:  [97 F (36.1 C)-97.8 F (36.6 C)] 97 F (36.1 C) (01/01 0830) Pulse Rate:  [77-84] 77 (01/01 0830) Resp:  [8-18] 18 (01/01 0830) BP: (65-78)/(32-45) 71/35 (01/01 0830) SpO2:  [66 %-90 %] 66 % (01/01 0830)  General -elderly African-American lady, not open eyes on voice, mouth open and breathing is regular.  Ophthalmologic - fundi not examined due to comfort care measures  Cardiovascular - Regular rhythm and rate.  Neuro - limited exam due to comfort care measures. Not open eyes on voice but with eyelid movement with voice stimulation, not following commands, nonverbal. Eyes closed, not open with voice. Continues to have mild oral secretions. No spontaneous movement in all extremities.    ASSESSMENT/PLAN Ms. RYLIE LIMBURG is a 87 y.o. female with history of previous stroke, HTN, HLD, initially admitted for acute deconditioning, gen weakness post anemia/gastritis. She was an in-house code stroke when staff noted a change in mentation. CTA showed Left M2 occlusion for which she had emergent thrombectomy. Had IR - 12/9 5:25 PM - Occluded Lt MCA inf division in the mid M2 seg. Delated partial reconstitution of Lt MCA from Lt ACA collaterals retrogradely. Unsuccessful attempts at  revascularization due to severe prox  Lt ICA tortuosity and of the aortic arch.  Stroke: L MCA moderate stroke due to left M2 occlusion with unsuccessful IR and in TIMELESS trial, likely cardioembolic etiology  Code Stroke CT head: hyperdensity along the left M2 MCA in the sylvian fissure  CTA head & neck Distal branch of M2 occlusion.   CT perfusion 62m with missmatch  MRI  Multiple scattered tiny strokes bilat, moderate LMCA stroke, small amt of petechial hemorrhage noted in bed of infarct. Small vessel ischemic  disease and old lacunar strokes noted.   Repeat CTA unchanged left M2 occlusion  2D Echo -08/11/2020 - EF > 75%. No cardiac source of emboli identified.   LDL 50  HgbA1c 5.6  UDS - negative  SARS - negative  VTE prophylaxis - SCDs  ASA 829m(but this was recently put on hold by GI for acute gastritis and anemia) prior to admission, was on ASA 325 mg daily -> now D/C'd  Comfort care measures  Family met with Dr. SeLeonie Mannd palliative care 12/22. Opted for comfort care. DNR.   Comfort care orders in place. SL morphine prn pain -> IV morphine PRN.  Cortrak removed -> comfort feeds available  Unrestricted visitation   On scopolamine patch and robinol PRN  Continued worsening now fever, hypotension and tachypnea -> anticipate hospital death  Currently resting comfortably with good response to morphine PRN. Re-evaluation in am, consider morphine drip if needed for comfort.  Recent GIB with anemia  12/2 admission for syncope, GIB  Found to have severe anemia with Hb 6.9  Received PRBC   EGD done showed NSAID related gastritis and gastric ulcers and Cameron's erosions. Per GI at that time, hold ASA for2 weeks from admission (restart 08/24/20), continue BID PPI for 4 months, start PO iron in 1 week.  This admission Hb 10.2->...->7.5->8.2->8.5->8.5->8.1->7.9->7.9->7.1->7.6  Iron resumed -> iron IV daily -> off  PPI IV bid -> off  AKI on CKD  Likely d/t contrast nephropathy  Cre 1.07->1.38->2.54->3.56->4.43->5.11->5.73->5.83->5.28->5.00->4.50->3.46->3.12   On IVF - NS @ 50-> LR @ 50->off   Increased  urine output   Nephrology onboard -> signed off  received albumin/lasix challenge 12/14.   TF with Glucerna @ 45 and Free water 100 ml Q 4 hrs-> off for comfort care  Hypertension  Home meds:  Toprol XL 25 mg daily, Lasix 60m   Off cleviprex  Current BP meds - metoprolol 12.5->25->12.5 bid -> now off   Hyperlipidemia  Home meds:  Lipitor 49mand Zetia 1060m  Home meds resumed -> D/c meds for comfort care  LDL 50, goal < 70  Diabetes type II Controlled  Home meds:  Insulin  HgbA1c 5.6, goal < 7.0  Off SSI and CBG monitoring for comfort care  Dysphagia   Did not pass swallow or MBS 12/13  Repeat MBS 08/26/2020 - pt to remain NPO -> comfort feeds now  NGT -> cortrak - removed - comfort feeds  TF @ 45 and FW -> off   Leukocytosis / Fever  WBC's - 19.3->14.4  T Max - 102.4  UA c/w UTI - culture - >100,000 colonies E-Coli  Possible pneumonia on CXR - 12/19 - More significant opacity in the left retrocardiac region is indeterminate and could represent atelectasis or pneumonia.  Pharmacy consult for antibiotic dosing Cefepime started 12/19 -> off  Other Stroke Risk Factors  Advanced Age >/= 65   Coronary artery disease  Congestive heart failure  Other Active Problems and Findings  Incidental finding - multiple thyroid nodules, the largest within the left lobe measuring 2.3 cm.   Pt was enrolled in TIMELESS trial prior to thrombectomy.  Small right frontal scalp hematoma   Rt groin hematoma - resolving   Soft stool x 2 on 08/21/20  Labial wound: consult WOCN. Cx multi flora  Full comfort care - decision regarding morphine drip pending - Palliative Care following closely. Pt is on scheduled as well as prn morphine IV. Drip has not been started.  Had discussion with family and she felt patient was resting comfortably and did not need drip at this point.  Hospital day # 23     To contact Stroke Continuity provider, please refer to [email or web address], contact General Neurology

## 2020-10-11 NOTE — Progress Notes (Signed)
Upon entering room @ 2055, patient noted to be breathless and pulseless. No heart sounds auscultated, and no palpable pulses present. Death pronounced per 2 RNs [facility] Merlyn Bollen/Tara Poudel). Physician notified as well as patient's granddaughter, Jonette Pesa notified of patient's death. [facility] notified; patient not a candidate for donation due to advanced age. See post-mortem flowsheet for additional details. Patient's granddaughter Brayton Layman presented to bedside and obtained patient's belongings that were in room (two artifical flowers). Patient Placement notified @ 2308.

## 2020-10-11 NOTE — Progress Notes (Signed)
Palliative:  HPI:87 y.o.femalewith history of previous stroke, HTN, HLD, initially admitted for acute deconditioning, gen weakness post anemia/gastritis.She was an in-house code stroke when staff noted a change in mentation.CTA showed Left M2 occlusion for which she had emergent thrombectomy.Continues with poor neurological status and severe dysphagia. Full comfort care.  [name] appears to be resting comfortably. Secretions better controlled. Breathing irregular but comfortable. Anticipate hospital death hours-days. No family at bedside.   Exam: Unresponsive, respirations irregular with periods of apnea, pulse weak, cool extremities, no s/s of distress  Plan: Continue current comfort measures with scheduled robinul, tylenol, and morphine. Anticipate hospital death soon.  15 minutes  [name], DNP, [facility] Palliative Medicine Team Team Phone # [phone]  Pager # [phone]  Greater than 50%  of this time was spent counseling and coordinating care related to the above assessment and plan.

## 2020-10-11 NOTE — Plan of Care (Signed)
Called by RN. Pt passed away at 12/20/2053. Family contacted by RN. Death certificate will be signed electronically.   Rosalin Hawking, MD PhD Stroke Neurology 2020/10/04 10:14 PM

## 2020-10-11 NOTE — Death Summary Note (Signed)
DEATH SUMMARY   Patient Details  Name: Maria [name] MRN: [id number] DOB: 10-Jan-1926  Admission/Discharge Information   Admit Date:  08/21/2020  Date of Death: Date of Death: September 15, 2020  Time of Death: Time of Death: 2053-10-28  Length of Stay: 2022-11-07  Referring Physician: Glendale Chard, MD   Reason(s) for Hospitalization  stroke  Diagnoses  Preliminary cause of death:  Secondary Diagnoses (including complications and co-morbidities):  Active Problems:   Left MCA stroke (cerebrum) (HCC) s/p TNK and unsuccessful thrombectomy   Middle cerebral artery embolism, left   ARF   Anemia    Recent GIB   Fever   Leukocytosis   HTN   HLD   DM   CAD   Pressure injury of skin   Brief Hospital Course (including significant findings, care, treatment, and services provided and events leading to death)  Maria [name] is a 87 y.o. year old female with history of previous stroke, HTN, HLD, initially admitted for acute deconditioning, gen weakness post anemia/gastritis. She was an in-house code stroke when staff noted a change in mentation. CTA showed Left M2 occlusion for which she had emergent thrombectomy. Had IR - 12/9 5:25 PM - Occluded Lt MCA inf division in the mid M2 seg. Delated partial reconstitution of Lt MCA from Lt ACA collaterals retrogradely. Unsuccessful attempts at  revascularization due to severe prox Lt ICA tortuosity and of the aortic arch.  Stroke: L MCA moderate stroke due to left M2 occlusion with unsuccessful IR and in TIMELESS trial, likely cardioembolic etiology  Code Stroke CT head: hyperdensity along the left M2 MCA in the sylvian fissure  CTA head & neck Distal branch of M2 occlusion.   CT perfusion 50m with missmatch  MRI  Multiple scattered tiny strokes bilat, moderate LMCA stroke, small amt of petechial hemorrhage noted in bed of infarct. Small vessel ischemic disease and old lacunar strokes noted.   Repeat CTA unchanged left M2 occlusion  2D Echo -08/11/2020  - EF > 75%. No cardiac source of emboli identified.   LDL 50  HgbA1c 5.6  UDS - negative  SARS - negative  ASA 863m(but this was recently put on hold by GI for acute gastritis and anemia) prior to admission, was on ASA 325 mg daily -> D/C'd due to comfort care  Comfort care measures  Family met with Dr. SeLeonie Mannd palliative care 12/22. Opted for comfort care. DNR.   Comfort care orders in place. SL morphine prn pain -> IV morphine PRN.  Cortrak removed -> comfort feeds available  Unrestricted visitation   On scopolamine patch and robinol PRN  hospital death 09/2020-01-06t 2002/18/2055Recent GIB with anemia  12/2 admission for syncope, GIB  Found to have severe anemia with Hb 6.9  Received PRBC   EGD done showed NSAID related gastritis and gastric ulcers and Cameron's erosions. Per GI at that time, hold ASA for2 weeksfrom admission (restart 08/24/20), continue BID PPI for 4 months, start PO iron in 1 week.  This admission Hb 10.2->...->7.5->8.2->8.5->8.5->8.1->7.9->7.9->7.1->7.6  Iron resumed -> iron IV daily -> off  PPI IV bid -> off  ARF  Likely d/t contrast nephropathy  Cre 1.07->1.38->2.54->3.56->4.43->5.11->5.73->5.83->5.28->5.00->4.50->3.46->3.12   On IVF - NS @ 50-> LR @ 50->off   Increased urine output   Nephrology onboard -> signed off  received albumin/lasix challenge 12/14.   TF with Glucerna @ 45 and Free water 100 ml Q 4 hrs-> off for comfort care  Hypertension  Home meds:  Toprol XL  25 mg daily, Lasix 36m   Off cleviprex  Current BP meds - metoprolol 12.5->25->12.5 bid -> now off   Hyperlipidemia  Home meds:  Lipitor 420mand Zetia 1047m Home meds resumed -> D/c meds for comfort care  LDL 50   Diabetes type II Controlled  Home meds:  Insulin  HgbA1c 5.6, goal < 7.0  Off SSI and CBG monitoring for comfort care  Dysphagia   Did not pass swallow or MBS 12/13  Repeat MBS 08/26/2020 - pt to remain NPO -> comfort feeds    NGT -> cortrak - removed - comfort feeds  TF @ 45 and FW -> off   Leukocytosis / Fever  WBC's - 19.3->14.4  T Max - 102.4  UA c/w UTI - culture - >100,000 colonies E-Coli  Possible pneumonia on CXR - 12/19 - More significant opacity in the left retrocardiac region is indeterminate and could represent atelectasis or pneumonia.  Pharmacy consult for antibiotic dosing Cefepime started 12/19 -> off  Other Stroke Risk Factors  Advanced Age >/= 65   Coronary artery disease  Congestive heart failure  Other Active Problems and Findings  Incidental finding - multiple thyroid nodules, the largest within the left lobe measuring 2.3 cm.   Pt was enrolled in TIMELESS trial prior to thrombectomy.  Small right frontal scalp hematoma   Rt groin hematoma - resolving   Soft stool x 2 on 08/21/20  Labial wound: consult WOCN. Cx multi flora   Pertinent Labs and Studies  Significant Diagnostic Studies CT ANGIO HEAD W OR WO CONTRAST  Result Date: 08/19/2020 CLINICAL DATA:  Stroke, follow-up. Timeless stroke study protocol. Stroke suspected. EXAM: CT ANGIOGRAPHY HEAD AND NECK CT PERFUSION BRAIN TECHNIQUE: Multidetector CT imaging of the head and neck was performed using the standard protocol during bolus administration of intravenous contrast. Multiplanar CT image reconstructions and MIPs were obtained to evaluate the vascular anatomy. Carotid stenosis measurements (when applicable) are obtained utilizing NASCET criteria, using the distal internal carotid diameter as the denominator. Multiphase CT imaging of the brain was performed following IV bolus contrast injection. Subsequent parametric perfusion maps were calculated using RAPID software. CONTRAST:  Administered contrast not known at this time. COMPARISON:  Brain MRI 08/19/2020. Noncontrast head CT, CT angiogram head/neck and CT perfusion 09/01/2020. FINDINGS: CT HEAD FINDINGS Brain: Mild generalized parenchymal atrophy. Loss of  gray-white differentiation within portions of the left insula and along the posterior aspect of the left sylvian fissure. A known moderate-sized acute/early subacute left MCA territory infarct within the mid to posterior left frontal lobe and left frontoparietal operculum was otherwise better appreciated on the brain MRI performed earlier today. Numerous additional predominantly subcentimeter cortical and subcortical acute/early subacute infarcts within the bilateral cerebral and cerebellar hemispheres were also better appreciated on this prior exam. No evidence of hemorrhagic conversion.  No mass effect. Stable background chronic small vessel ischemic disease. Known chronic infarcts within the bilateral deep gray nuclei and cerebellar hemispheres. No extra-axial fluid collection. No midline shift. Vascular: Reported below. Skull: Normal. Negative for fracture or focal lesion. Sinuses/Orbits: Visualized orbits show no acute finding. Small volume frothy secretions within the left sphenoid sinus. Trace ethmoid and right sphenoid sinus mucosal thickening. CTA HEAD FINDINGS Anterior circulation: The intracranial internal carotid arteries are patent. Calcified plaque within both vessels with no more than mild stenosis. The M1 middle cerebral arteries are patent. Persistent occlusion of a superior division proximal M2 left MCA vessel (series 14, image 21) (series 16, image 28).  The A1 right anterior cerebral artery is markedly hypoplastic or absent on a developmental basis. The anterior cerebral arteries are otherwise patent. No intracranial aneurysm is identified. Posterior circulation: The intracranial vertebral arteries are patent. Nonstenotic calcified plaque within the V4 left vertebral artery. The basilar artery is patent. The posterior cerebral arteries are patent. Posterior communicating arteries are present bilaterally. Venous sinuses: Within the limitations of contrast timing, no convincing thrombus. Anatomic  variants: As described Review of the MIP images confirms the above findings CT Brain Perfusion Findings: CBF (<30%) Volume: 52m Perfusion (Tmax>6.0s) volume: 172m(left MCA vascular territory) Mismatch Volume: 1464mnfarction Location:None identified IMPRESSION: CT head: 1. Loss of gray-white differentiation within portions of the left insula and along the posterior aspect of the left sylvian fissure. A known moderate-sized acute/early subacute left MCA territory infarct was otherwise better appreciated on the brain MRI performed earlier today. 2. Numerous predominantly subcentimeter cortical and subcortical acute/early subacute infarcts within the bilateral cerebral and cerebellar hemispheres were also better appreciated on this prior exam. 3. No evidence of hemorrhagic conversion. 4. Stable background generalized parenchymal atrophy and chronic ischemic changes. CTA head: 1. Persistent occlusion of a superior division proximal M2 left MCA vessel. 2. Calcified plaque within the intracranial ICAs with no more than mild stenosis. CT perfusion head: The perfusion software identifies no core infarct. However, there are appreciable acute/early subacute left MCA territory infarction changes on the concurrently performed non-contrast head CT, as described. The perfusion software identifies a 14 mL region of hypoperfused parenchyma in the left MCA vascular territory utilizing the Tmax>6 seconds threshold. Reported mismatch volume: 14 mL. Electronically Signed   By: KylKellie Simmering   On: 08/19/2020 13:39   DG Abd 1 View  Result Date: 08/20/2020 CLINICAL DATA:  Feeding tube placement. EXAM: ABDOMEN - 1 VIEW COMPARISON:  Chest radiograph 08/26/2020 FINDINGS: Imaging focused on the upper abdomen and lower chest. There is new volume loss in the left hemithorax with opacity at the left lung base and elevated hemidiaphragm. The enteric tube tip and side port are below the left hemidiaphragm in the left upper quadrant.  Progressive hazy opacity at the right lung base. Patient is significantly rotated. IMPRESSION: 1. Enteric tube tip and side port below the left hemidiaphragm in the left upper quadrant. 2. New volume loss in the left hemithorax with opacity at the left lung base, this may be due to mucous plugging and/or aspiration. 3. Progressive hazy opacity at the right lung base, may represent atelectasis, airspace disease, pleural fluid or combination there of. Electronically Signed   By: MelKeith RakeD.   On: 08/20/2020 17:06   CT Head Wo Contrast  Result Date: 08/19/2020 CLINICAL DATA:  Stroke, follow-up. Timeless stroke study protocol. Stroke suspected. EXAM: CT ANGIOGRAPHY HEAD AND NECK CT PERFUSION BRAIN TECHNIQUE: Multidetector CT imaging of the head and neck was performed using the standard protocol during bolus administration of intravenous contrast. Multiplanar CT image reconstructions and MIPs were obtained to evaluate the vascular anatomy. Carotid stenosis measurements (when applicable) are obtained utilizing NASCET criteria, using the distal internal carotid diameter as the denominator. Multiphase CT imaging of the brain was performed following IV bolus contrast injection. Subsequent parametric perfusion maps were calculated using RAPID software. CONTRAST:  Administered contrast not known at this time. COMPARISON:  Brain MRI 08/19/2020. Noncontrast head CT, CT angiogram head/neck and CT perfusion 08/26/2020. FINDINGS: CT HEAD FINDINGS Brain: Mild generalized parenchymal atrophy. Loss of gray-white differentiation within portions of the left insula and  along the posterior aspect of the left sylvian fissure. A known moderate-sized acute/early subacute left MCA territory infarct within the mid to posterior left frontal lobe and left frontoparietal operculum was otherwise better appreciated on the brain MRI performed earlier today. Numerous additional predominantly subcentimeter cortical and subcortical  acute/early subacute infarcts within the bilateral cerebral and cerebellar hemispheres were also better appreciated on this prior exam. No evidence of hemorrhagic conversion.  No mass effect. Stable background chronic small vessel ischemic disease. Known chronic infarcts within the bilateral deep gray nuclei and cerebellar hemispheres. No extra-axial fluid collection. No midline shift. Vascular: Reported below. Skull: Normal. Negative for fracture or focal lesion. Sinuses/Orbits: Visualized orbits show no acute finding. Small volume frothy secretions within the left sphenoid sinus. Trace ethmoid and right sphenoid sinus mucosal thickening. CTA HEAD FINDINGS Anterior circulation: The intracranial internal carotid arteries are patent. Calcified plaque within both vessels with no more than mild stenosis. The M1 middle cerebral arteries are patent. Persistent occlusion of a superior division proximal M2 left MCA vessel (series 14, image 21) (series 16, image 28). The A1 right anterior cerebral artery is markedly hypoplastic or absent on a developmental basis. The anterior cerebral arteries are otherwise patent. No intracranial aneurysm is identified. Posterior circulation: The intracranial vertebral arteries are patent. Nonstenotic calcified plaque within the V4 left vertebral artery. The basilar artery is patent. The posterior cerebral arteries are patent. Posterior communicating arteries are present bilaterally. Venous sinuses: Within the limitations of contrast timing, no convincing thrombus. Anatomic variants: As described Review of the MIP images confirms the above findings CT Brain Perfusion Findings: CBF (<30%) Volume: 16m Perfusion (Tmax>6.0s) volume: 121m(left MCA vascular territory) Mismatch Volume: 1441mnfarction Location:None identified IMPRESSION: CT head: 1. Loss of gray-white differentiation within portions of the left insula and along the posterior aspect of the left sylvian fissure. A known  moderate-sized acute/early subacute left MCA territory infarct was otherwise better appreciated on the brain MRI performed earlier today. 2. Numerous predominantly subcentimeter cortical and subcortical acute/early subacute infarcts within the bilateral cerebral and cerebellar hemispheres were also better appreciated on this prior exam. 3. No evidence of hemorrhagic conversion. 4. Stable background generalized parenchymal atrophy and chronic ischemic changes. CTA head: 1. Persistent occlusion of a superior division proximal M2 left MCA vessel. 2. Calcified plaque within the intracranial ICAs with no more than mild stenosis. CT perfusion head: The perfusion software identifies no core infarct. However, there are appreciable acute/early subacute left MCA territory infarction changes on the concurrently performed non-contrast head CT, as described. The perfusion software identifies a 14 mL region of hypoperfused parenchyma in the left MCA vascular territory utilizing the Tmax>6 seconds threshold. Reported mismatch volume: 14 mL. Electronically Signed   By: KylKellie Simmering   On: 08/19/2020 13:39   MR BRAIN WO CONTRAST  Result Date: 08/21/2020 CLINICAL DATA:  Follow-up examination for acute stroke. EXAM: MRI HEAD WITHOUT CONTRAST TECHNIQUE: Multiplanar, multiecho pulse sequences of the brain and surrounding structures were obtained without intravenous contrast. COMPARISON:  Prior CTs from 08/19/2020. FINDINGS: Brain: Moderately advanced cerebral atrophy and chronic microvascular ischemic disease again noted, stable. Scattered remote lacunar infarcts about the bilateral basal ganglia/internal capsules again noted. Few additional remote lacunar infarcts about the midbrain, with additional scattered remote bilateral cerebellar infarcts again noted. Continued interval evolution of moderate-sized left MCA distribution infarct involving the mid and posterior left frontal region. Associated diffusion abnormality is  somewhat increased and more confluent in appearance as compared to previous, with slightly  increased size. Evidence for faint petechial hemorrhage at the infarct bed without hemorrhagic transformation or significant regional mass effect (series 14, image 28). Multiple additional scattered predominantly cortical and subcortical ischemic infarcts again noted involving the bilateral cerebral hemispheres, as well as the right greater than left cerebellum involvement of the pre and postcentral gyri again noted bilaterally. Overall number and distribution of these additional infarcts is not felt to be significantly changed or progressed. Possible single focus of associated petechial hemorrhage at the left parietal lobe (series 14, image 33). No other evidence for associated hemorrhage or significant mass effect. Few additional scattered chronic micro hemorrhages noted within the brain and cerebellum, otherwise stable. No mass lesion, midline shift or mass effect. Ventricular size is stable without hydrocephalus. No extra-axial fluid collection. Pituitary gland suprasellar region normal. Midline structures intact. Vascular: Focal T1 hyperintensity and susceptibility artifact seen involving a proximal left M2 branch, consistent with major intracranial vascular flow voids are otherwise maintained. Thrombus (series 14, image 23). Skull and upper cervical spine: Craniocervical junction within normal limits. Bone marrow signal intensity within normal limits. Interval evolution of previously identified right frontal scalp contusion, decreased in prominence from previous. Sinuses/Orbits: Sequelae of prior bilateral ocular lens replacement. Globes and orbital soft tissues demonstrate no acute finding. Chronic mucosal thickening noted within the sphenoethmoidal sinuses. Paranasal sinuses are otherwise clear. Mild to moderate bilateral mastoid effusions, slightly increased in prominence from previous. Inner ear structures within  normal limits. Other: None. IMPRESSION: 1. Continued interval evolution of moderate-sized early subacute left MCA distribution infarct involving the mid and posterior left frontal region. Evidence for faint petechial hemorrhage at the infarct bed without hemorrhagic transformation or significant regional mass effect. 2. Multiple additional scattered predominantly cortical and subcortical early subacute ischemic infarcts involving the bilateral cerebral hemispheres and cerebellum, not significantly changed or progressed from previous. 3. Focal T1 hyperintensity and susceptibility artifact involving a proximal left M2 branch, consistent with previously identified intraluminal thrombus. 4. Underlying moderately advanced cerebral atrophy with chronic microvascular ischemic disease with remote ischemic changes as above, stable. Electronically Signed   By: Jeannine Boga M.D.   On: 08/21/2020 21:29   MR BRAIN WO CONTRAST  Result Date: 08/19/2020 CLINICAL DATA:  Follow-up examination for acute stroke, right-sided weakness and aphasia. EXAM: MRI HEAD WITHOUT CONTRAST TECHNIQUE: Multiplanar, multiecho pulse sequences of the brain and surrounding structures were obtained without intravenous contrast. COMPARISON:  Prior CTs from 08/25/2020. FINDINGS: Brain: Examination degraded by motion artifact. Diffuse prominence of the CSF containing spaces compatible with generalized cerebral atrophy. Patchy and confluent T2/FLAIR hyperintensity within the periventricular and deep white matter both cerebral hemispheres consistent with chronic small vessel ischemic disease, moderate in nature. Scattered remote lacunar infarcts noted about the bilateral basal ganglia/internal capsules. Few scattered remote lacunar infarcts about the midbrain, with additional scattered remote bilateral cerebellar infarcts. Confluent area of restricted diffusion involving the mid-posterior left frontal region consistent with acute left MCA  territory infarct, corresponding with perfusion deficit seen on prior CT perfusion. Suspected associated petechial hemorrhage without frank hemorrhagic transformation, although evaluation somewhat limited due to extensive SWI signal throughout the intracranial vasculature, suspected be related to iron administration. No significant regional mass effect. Multiple additional scattered cortical and subcortical ischemic infarcts seen involving the bilateral cerebral hemispheres, with involvement of the frontal, parietal, and occipital lobes, left slightly worse than right. These are predominantly subcentimeter in nature. Patchy involvement of both pre and postcentral gyri noted. Additional scattered subcentimeter ischemic infarcts noted involving the right greater than  left cerebellar hemispheres. No associated hemorrhage or mass effect about these additional infarcts. No mass lesion, midline shift or mass effect. No hydrocephalus or extra-axial fluid collection. Pituitary gland and suprasellar region within normal limits. Midline structures intact. Few additional small chronic micro hemorrhages noted at the left centrum semi ovale, left basal ganglia, and left cerebellum. Vascular: Major intracranial vascular flow voids are maintained at the skull base. Diffuse SWI signal with T1 hyperintensity throughout the intracranial vasculature, likely related to iron administration. Skull and upper cervical spine: Craniocervical junction normal. Bone marrow signal intensity normal. There is an apparent evolving right frontal scalp contusion (series 5, image 85). Sinuses/Orbits: Globes and orbital soft tissues demonstrate no acute finding. Patient status post bilateral ocular lens replacement. Mild sphenoid sinus disease. Small bilateral mastoid effusions noted, of doubtful significance. Inner ear structures grossly normal. Other: None. IMPRESSION: 1. Moderate-sized acute ischemic nonhemorrhagic left MCA territory infarct.  Suspected associated petechial hemorrhage without frank hemorrhagic transformation or significant regional mass effect. Size and distribution closely matches previously seen perfusion deficit. 2. Multiple additional scattered predominantly subcentimeter cortical and subcortical ischemic infarcts involving the bilateral cerebral and cerebellar hemispheres as above. No associated hemorrhage or mass effect. 3. Underlying age-related cerebral atrophy with moderate chronic small vessel ischemic disease, with multiple remote lacunar infarcts about the deep gray nuclei, midbrain, and cerebellum. 4. Apparent evolving right frontal scalp contusion. Electronically Signed   By: Jeannine Boga M.D.   On: 08/19/2020 03:10   US RENAL  Addendum Date: 08/21/2020   ADDENDUM REPORT: 08/21/2020 15:14 IMPRESSION: Echogenic focus in the right kidney is nonspecific but could represent a small nonobstructing renal stone. Electronically Signed   By: Audie Pinto M.D.   On: 08/21/2020 15:14   Result Date: 08/21/2020 CLINICAL DATA:  Acute kidney injury EXAM: RENAL / URINARY TRACT ULTRASOUND COMPLETE COMPARISON:  Renal ultrasound 08/10/2020 FINDINGS: Right Kidney: Renal measurements: 10.0 x 5.1 x 5.5 cm = volume: 146 mL. Mild increased echogenicity. There is an echogenic focus in the superior aspect of the kidney measuring 0.4 cm which could represent a small calculus. There is a 1.0 cm cyst. No hydronephrosis. Left Kidney: Renal measurements: 11.1 x 5.6 x 6.6 cm = volume: 212 mL. Mildly increased echogenicity. No hydronephrosis. There are 2 cysts measuring 2.3 cm and 1.0 cm. Bladder: Poorly visualized, possibly decompressed. Other: None. IMPRESSION: No acute finding in the bilateral kidneys. Increased echogenicity bilaterally as can be seen in medical renal disease. Electronically Signed: By: Audie Pinto M.D. On: 08/21/2020 15:08   CT CEREBRAL PERFUSION W CONTRAST  Result Date: 08/19/2020 CLINICAL DATA:  Stroke,  follow-up. Timeless stroke study protocol. Stroke suspected. EXAM: CT ANGIOGRAPHY HEAD AND NECK CT PERFUSION BRAIN TECHNIQUE: Multidetector CT imaging of the head and neck was performed using the standard protocol during bolus administration of intravenous contrast. Multiplanar CT image reconstructions and MIPs were obtained to evaluate the vascular anatomy. Carotid stenosis measurements (when applicable) are obtained utilizing NASCET criteria, using the distal internal carotid diameter as the denominator. Multiphase CT imaging of the brain was performed following IV bolus contrast injection. Subsequent parametric perfusion maps were calculated using RAPID software. CONTRAST:  Administered contrast not known at this time. COMPARISON:  Brain MRI 08/19/2020. Noncontrast head CT, CT angiogram head/neck and CT perfusion 09/06/2020. FINDINGS: CT HEAD FINDINGS Brain: Mild generalized parenchymal atrophy. Loss of gray-white differentiation within portions of the left insula and along the posterior aspect of the left sylvian fissure. A known moderate-sized acute/early subacute left MCA territory infarct  within the mid to posterior left frontal lobe and left frontoparietal operculum was otherwise better appreciated on the brain MRI performed earlier today. Numerous additional predominantly subcentimeter cortical and subcortical acute/early subacute infarcts within the bilateral cerebral and cerebellar hemispheres were also better appreciated on this prior exam. No evidence of hemorrhagic conversion.  No mass effect. Stable background chronic small vessel ischemic disease. Known chronic infarcts within the bilateral deep gray nuclei and cerebellar hemispheres. No extra-axial fluid collection. No midline shift. Vascular: Reported below. Skull: Normal. Negative for fracture or focal lesion. Sinuses/Orbits: Visualized orbits show no acute finding. Small volume frothy secretions within the left sphenoid sinus. Trace ethmoid and  right sphenoid sinus mucosal thickening. CTA HEAD FINDINGS Anterior circulation: The intracranial internal carotid arteries are patent. Calcified plaque within both vessels with no more than mild stenosis. The M1 middle cerebral arteries are patent. Persistent occlusion of a superior division proximal M2 left MCA vessel (series 14, image 21) (series 16, image 28). The A1 right anterior cerebral artery is markedly hypoplastic or absent on a developmental basis. The anterior cerebral arteries are otherwise patent. No intracranial aneurysm is identified. Posterior circulation: The intracranial vertebral arteries are patent. Nonstenotic calcified plaque within the V4 left vertebral artery. The basilar artery is patent. The posterior cerebral arteries are patent. Posterior communicating arteries are present bilaterally. Venous sinuses: Within the limitations of contrast timing, no convincing thrombus. Anatomic variants: As described Review of the MIP images confirms the above findings CT Brain Perfusion Findings: CBF (<30%) Volume: 32m Perfusion (Tmax>6.0s) volume: 142m(left MCA vascular territory) Mismatch Volume: [id number] Location:None identified IMPRESSION: CT head: 1. Loss of gray-white differentiation within portions of the left insula and along the posterior aspect of the left sylvian fissure. A known moderate-sized acute/early subacute left MCA territory infarct was otherwise better appreciated on the brain MRI performed earlier today. 2. Numerous predominantly subcentimeter cortical and subcortical acute/early subacute infarcts within the bilateral cerebral and cerebellar hemispheres were also better appreciated on this prior exam. 3. No evidence of hemorrhagic conversion. 4. Stable background generalized parenchymal atrophy and chronic ischemic changes. CTA head: 1. Persistent occlusion of a superior division proximal M2 left MCA vessel. 2. Calcified plaque within the intracranial ICAs with no more than  mild stenosis. CT perfusion head: The perfusion software identifies no core infarct. However, there are appreciable acute/early subacute left MCA territory infarction changes on the concurrently performed non-contrast head CT, as described. The perfusion software identifies a 14 mL region of hypoperfused parenchyma in the left MCA vascular territory utilizing the Tmax>6 seconds threshold. Reported mismatch volume: 14 mL. Electronically Signed   By: KylKellie Simmering   On: 08/19/2020 13:39   CT CEREBRAL PERFUSION W CONTRAST  Result Date: 08/27/2020 CLINICAL DATA:  Neuro deficit, acute, stroke suspected. Additional history provided: Right-sided facial droop, slurred speech. EXAM: CT ANGIOGRAPHY HEAD AND NECK CT PERFUSION BRAIN TECHNIQUE: Multidetector CT imaging of the head and neck was performed using the standard protocol during bolus administration of intravenous contrast. Multiplanar CT image reconstructions and MIPs were obtained to evaluate the vascular anatomy. Carotid stenosis measurements (when applicable) are obtained utilizing NASCET criteria, using the distal internal carotid diameter as the denominator. Multiphase CT imaging of the brain was performed following IV bolus contrast injection. Subsequent parametric perfusion maps were calculated using RAPID software. CONTRAST:  42m40mNIPAQUE IOHEXOL 350 MG/ML SOLN COMPARISON:  Noncontrast head CT performed earlier today 08/28/2020. FINDINGS: CTA NECK FINDINGS Aortic arch: The left vertebral artery arises directly from  the aortic arch. Atherosclerotic plaque within the visualized aortic arch and proximal major branch vessels of the neck. No hemodynamically significant innominate or proximal subclavian artery stenosis. Right carotid system: CCA and ICA patent within the neck without stenosis. Mild calcified plaque within the carotid bifurcation and proximal ICA. Left carotid system: CCA and ICA patent within the neck without significant stenosis (50% or  greater). Moderate calcified plaque within the carotid bifurcation and proximal ICA. Vertebral arteries: Vertebral arteries codominant and patent within the neck without stenosis. Nonstenotic calcified plaque at the origin of the left vertebral artery. Skeleton: No acute bony abnormality or aggressive osseous lesion. Cervical spondylosis with multilevel disc space narrowing, disc bulges, posterior disc osteophytes, uncovertebral hypertrophy and facet arthrosis. Suspected degenerative fusion across the disc spaces at C4-C5 and C6-C7. Grade 1 anterolisthesis at C3-C4, C4-C5, C5-C6. Other neck: No pathologically enlarged cervical chain lymph nodes. Multiple thyroid nodules, the largest within the left lobe measuring 2.3 cm (series 7, image 110). Upper chest: No consolidation within the imaged lung apices. Review of the MIP images confirms the above findings CTA HEAD FINDINGS Anterior circulation: The intracranial internal carotid arteries are patent. Calcified plaque within both vessels with no more than mild stenosis. The M1 middle cerebral arteries are patent. Occlusion of a superior division proximal M2 left MCA vessel (series 11, image 23) (series 9, image 45). The A1 right anterior cerebral artery is markedly hypoplastic or absent on a developmental basis. The anterior cerebral arteries are otherwise patent. No intracranial aneurysm is identified. Posterior circulation: The intracranial vertebral arteries are patent. Nonstenotic calcified plaque within the left V4 segment. The basilar artery is patent. The posterior cerebral arteries are patent. Posterior communicating arteries are present bilaterally. Venous sinuses: Within the limitations of contrast timing, no convincing thrombus. Anatomic variants: As described Review of the MIP images confirms the above findings CT Brain Perfusion Findings: The perfusion software detects suboptimal contrast bolus timing and/or excessive motion, limiting the reliability of the  perfusion data. CBF (<30%) Volume: 45m Perfusion (Tmax>6.0s) volume: 152m(predominantly within the left MCA vascular territory). Mismatch Volume: 1970mnfarction Location:None identified Emergent findings were called by telephone at the time of interpretation on 08/24/2020 at 1:54 pm to provider Dr. HavGilford Raidho verbally acknowledged these results. IMPRESSION: CTA neck: 1. The common carotid, internal carotid and vertebral arteries are patent within the neck without hemodynamically significant stenosis. Atherosclerotic disease within these vessels as described. Most notably, there is moderate calcified plaque within the left carotid bifurcation and proximal ICA. 2. Multiple thyroid nodules, the largest within the left lobe measuring 2.3 cm. Nonemergent thyroid ultrasound is recommended for further evaluation. CTA head: 1. Occlusion of a superior division proximal M2 left MCA vessel. Emergent neuro-interventional consultation is recommended. 2. Calcified plaque within the intracranial ICAs with no more than mild resultant stenosis. CT perfusion head: 1. The perfusion software detects suboptimal contrast bolus timing and/or excessive motion, limiting the reliability of the perfusion data. 2. The perfusion software identifies a 19 mL region of hypoperfused parenchyma predominantly within the left MCA vascular territory. No core infarct is detected. Electronically Signed   By: KylKellie Simmering   On: 08/14/2020 14:15   DG Chest Port 1 View  Result Date: 08/28/2020 CLINICAL DATA:  Fever.  Diabetes. EXAM: PORTABLE CHEST 1 VIEW COMPARISON:  August 18, 2020 FINDINGS: The feeding tube terminates below today's film. No pneumothorax. Mild opacity in the right base may be atelectasis. More significant opacity in the left retrocardiac region is identified as  well, indeterminate. No other acute abnormalities. IMPRESSION: Opacity in the right base may be atelectasis. More significant opacity in the left retrocardiac region is  indeterminate and could represent atelectasis or pneumonia. Recommend clinical correlation and short-term follow-up imaging to ensure resolution. Electronically Signed   By: Dorise Bullion III M.D   On: 08/28/2020 12:17   DG Chest Port 1 View  Result Date: 08/31/2020 CLINICAL DATA:  Stroke EXAM: PORTABLE CHEST 1 VIEW COMPARISON:  08/10/2020 FINDINGS: The patient is rotated to the right on today's radiograph, reducing diagnostic sensitivity and specificity. Atherosclerotic calcification of the aortic arch. Heart size within normal limits. The lungs appear clear. No blunting of the costophrenic angles. Bony demineralization is present. IMPRESSION: 1. No acute findings. 2.  Aortic Atherosclerosis (ICD10-I70.0). Electronically Signed   By: Van Clines M.D.   On: 08/27/2020 19:10   DG Swallowing Func-Speech Pathology  Result Date: 08/26/2020 Objective Swallowing Evaluation: Type of Study: MBS-Modified Barium Swallow Study  Patient Details Name: Maria [name] MRN: [id number] Date of Birth: May 16, 1926 Today's Date: 08/26/2020 Time: SLP Start Time (ACUTE ONLY): 0827 -SLP Stop Time (ACUTE ONLY): 0840 SLP Time Calculation (min) (ACUTE ONLY): 13 min Past Medical History: Past Medical History: Diagnosis Date . Anxiety  . Arthritis  . Blood transfusion  . Depression  . Diabetes mellitus  . Edema  . Heart murmur  . Hyperlipidemia  . Hypertension  . Sleep apnea   cpap . Stroke ([place])   3 x tias . TIA (transient ischemic attack)  Past Surgical History: Past Surgical History: Procedure Laterality Date . ABDOMINAL HYSTERECTOMY   . APPENDECTOMY   . BIOPSY  08/13/2020  Procedure: BIOPSY;  Surgeon: Rush Landmark Telford Nab., MD;  Location: [facility];  Service: Gastroenterology;; . ESOPHAGOGASTRODUODENOSCOPY (EGD) WITH PROPOFOL N/A 08/13/2020  Procedure: ESOPHAGOGASTRODUODENOSCOPY (EGD) WITH PROPOFOL;  Surgeon: Irving Copas., MD;  Location: [facility];  Service: Gastroenterology;  Laterality: N/A; . HEMOSTASIS  CLIP PLACEMENT  08/13/2020  Procedure: HEMOSTASIS CLIP PLACEMENT;  Surgeon: Irving Copas., MD;  Location: [facility];  Service: Gastroenterology;; . IR ANGIO VERTEBRAL SEL VERTEBRAL UNI L MOD SED  08/29/2020 . IR PERCUTANEOUS ART THROMBECTOMY/INFUSION INTRACRANIAL INC DIAG ANGIO  08/29/2020 . NM MYOVIEW LTD  [id number]  post stress left ventricle is normal in size, ejection fraction is 65%, normal myocardial perfusion study, low risk scan . PARTIAL HYSTERECTOMY   . RADIOLOGY WITH ANESTHESIA N/A 08/19/2020  Procedure: IR WITH ANESTHESIA - CODE STROKE;  Surgeon: Luanne Bras, MD;  Location: [place];  Service: Radiology;  Laterality: N/A; . TRANSTHORACIC ECHOCARDIOGRAM  [id number] HPI: Maria [name] is a 87 y.o. with a history of previous stroke, hypertension, DM, hyperlipidemia who was recently admitted after syncopal episode and found to have anemia with gastritis and erosions (started on PPI).  Developed with right-sided weakness and aphasia. Found to have cerebral infarction due to occlusion or stenosis of left middle cerebral artery. CXR clear. No prior ST notes. She had neuro change on 12/11 with MRI pending. Previous MBS 12/13 recommended NPO and repeat today for possible improvements.  No data recorded Assessment / Plan / Recommendation CHL IP CLINICAL IMPRESSIONS 08/26/2020 Clinical Impression Pt's swallow function has declined mildly from prior MBS. Significant lingual protrusion present and weakness. She was able to retract tongue on request but not maintain this position resulitng in inability to form bolus and honey thick barium fell from right side mouth. With puree, she could not initiate manipulation with SLP strategies of dry spoon presentations with lingual pressure  and bolus ultimately suctioned from oral cavity and oral care provided. Non meals of nutrition versus comfort feeds recommended. MD has discussed with granddaughter who had decided to proceed with longer term alternative means of  nutrition. ST can provide treatment while here for education and oral manipulation and transit. SLP Visit Diagnosis Dysphagia, oral phase (R13.11) Attention and concentration deficit following -- Frontal lobe and executive function deficit following -- Impact on safety and function Severe aspiration risk;Risk for inadequate nutrition/hydration   CHL IP TREATMENT RECOMMENDATION 08/26/2020 Treatment Recommendations Therapy as outlined in treatment plan below   Prognosis 08/26/2020 Prognosis for Safe Diet Advancement Fair Barriers to Reach Goals Severity of deficits Barriers/Prognosis Comment -- CHL IP DIET RECOMMENDATION 08/26/2020 SLP Diet Recommendations NPO Liquid Administration via -- Medication Administration Via alternative means Compensations -- Postural Changes --   CHL IP OTHER RECOMMENDATIONS 08/26/2020 Recommended Consults -- Oral Care Recommendations Oral care QID Other Recommendations --   CHL IP FOLLOW UP RECOMMENDATIONS 08/26/2020 Follow up Recommendations Skilled Nursing facility   [facility] IP FREQUENCY AND DURATION 08/26/2020 Speech Therapy Frequency (ACUTE ONLY) min 1 x/week Treatment Duration 2 weeks      CHL IP ORAL PHASE 08/26/2020 Oral Phase Impaired Oral - Pudding Teaspoon -- Oral - Pudding Cup -- Oral - Honey Teaspoon Right anterior bolus loss;Holding of bolus Oral - Honey Cup NT Oral - Nectar Teaspoon NT Oral - Nectar Cup NT Oral - Nectar Straw -- Oral - Thin Teaspoon -- Oral - Thin Cup -- Oral - Thin Straw -- Oral - Puree Left anterior bolus loss;Holding of bolus Oral - Mech Soft -- Oral - Regular -- Oral - Multi-Consistency -- Oral - Pill -- Oral Phase - Comment --  CHL IP PHARYNGEAL PHASE 08/26/2020 Pharyngeal Phase (No Data) Pharyngeal- Pudding Teaspoon -- Pharyngeal -- Pharyngeal- Pudding Cup -- Pharyngeal -- Pharyngeal- Honey Teaspoon (No Data) Pharyngeal -- Pharyngeal- Honey Cup NT Pharyngeal -- Pharyngeal- Nectar Teaspoon NT Pharyngeal -- Pharyngeal- Nectar Cup NT Pharyngeal -- Pharyngeal-  Nectar Straw -- Pharyngeal -- Pharyngeal- Thin Teaspoon -- Pharyngeal -- Pharyngeal- Thin Cup -- Pharyngeal -- Pharyngeal- Thin Straw -- Pharyngeal -- Pharyngeal- Puree (No Data) Pharyngeal -- Pharyngeal- Mechanical Soft -- Pharyngeal -- Pharyngeal- Regular -- Pharyngeal -- Pharyngeal- Multi-consistency -- Pharyngeal -- Pharyngeal- Pill -- Pharyngeal -- Pharyngeal Comment --  CHL IP CERVICAL ESOPHAGEAL PHASE 08/26/2020 Cervical Esophageal Phase (No Data) Pudding Teaspoon -- Pudding Cup -- Honey Teaspoon -- Honey Cup -- Nectar Teaspoon -- Nectar Cup -- Nectar Straw -- Thin Teaspoon -- Thin Cup -- Thin Straw -- Puree -- Mechanical Soft -- Regular -- Multi-consistency -- Pill -- Cervical Esophageal Comment -- [name] 08/26/2020, 9:29 AM [name].Ed Actor Pager [phone] Office [phone]              DG Swallowing Func-Speech Pathology  Result Date: 08/22/2020 Objective Swallowing Evaluation: Type of Study: MBS-Modified Barium Swallow Study  Patient Details Name: Maria [name] MRN: [id number] Date of Birth: [birth year] Today's Date: 08/22/2020 Time: SLP Start Time (ACUTE ONLY): 1345 -SLP Stop Time (ACUTE ONLY): 9675 SLP Time Calculation (min) (ACUTE ONLY): 20 min Past Medical History: Past Medical History: Diagnosis Date . Anxiety  . Arthritis  . Blood transfusion  . Depression  . Diabetes mellitus  . Edema  . Heart murmur  . Hyperlipidemia  . Hypertension  . Sleep apnea   cpap . Stroke ([place])   3 x tias . TIA (transient ischemic attack)  Past Surgical History: Past Surgical  History: Procedure Laterality Date . ABDOMINAL HYSTERECTOMY   . APPENDECTOMY   . BIOPSY  08/13/2020  Procedure: BIOPSY;  Surgeon: Rush Landmark Telford Nab., MD;  Location: [facility];  Service: Gastroenterology;; . ESOPHAGOGASTRODUODENOSCOPY (EGD) WITH PROPOFOL N/A 08/13/2020  Procedure: ESOPHAGOGASTRODUODENOSCOPY (EGD) WITH PROPOFOL;  Surgeon: Irving Copas., MD;  Location: [facility];  Service: Gastroenterology;  Laterality: N/A; . HEMOSTASIS CLIP PLACEMENT  08/13/2020  Procedure: HEMOSTASIS CLIP PLACEMENT;  Surgeon: Irving Copas., MD;  Location: [facility];  Service: Gastroenterology;; . IR PERCUTANEOUS ART THROMBECTOMY/INFUSION INTRACRANIAL INC DIAG ANGIO  08/19/2020 . NM MYOVIEW LTD  [id number]  post stress left ventricle is normal in size, ejection fraction is 65%, normal myocardial perfusion study, low risk scan . PARTIAL HYSTERECTOMY   . RADIOLOGY WITH ANESTHESIA N/A 09/07/2020  Procedure: IR WITH ANESTHESIA - CODE STROKE;  Surgeon: Luanne Bras, MD;  Location: [place];  Service: Radiology;  Laterality: N/A; . TRANSTHORACIC ECHOCARDIOGRAM  [id number] HPI: Maria [name] is a 87 y.o. with a history of previous stroke, hypertension, DM, hyperlipidemia who was recently admitted after syncopal episode and found to have anemia with gastritis and erosions (started on PPI).  Developed with right-sided weakness and aphasia. Found to have cerebral infarction due to occlusion or stenosis of left middle cerebral artery. CXR clear. No prior ST notes. She had neuro change on 12/11 with MRI pending  No data recorded Assessment / Plan / Recommendation CHL IP CLINICAL IMPRESSIONS 08/22/2020 Clinical Impression Oropharyngeal dysphagia is mild-moderate and marked by oral transit delays, residue, reduced propulsion and mistimed swallow onset. Combination of reduced lingual ROM/sensation led to significantly delayed transit to pharynx up to 3 minutes. Excessive right bucal cavity retention and anterior spillage without awareness. All trials reached her valleculae and sat for greater than 8-10 seconds before swallow initiated. Nectar thick was penetrated to her vocal cords, without response and almost exited vestibule but slight remained at petiole of epiglottis. Mild-mod vallecular and pyriform sinus residue with honey thick without penetration/aspiration. Given delays in oral and  pharyngeal transit she would likely be unable to maintain nurtitional needs. Recommend continue NPO and therapist willl continue intervention. SLP Visit Diagnosis Dysphagia, oropharyngeal phase (R13.12) Attention and concentration deficit following -- Frontal lobe and executive function deficit following -- Impact on safety and function Moderate aspiration risk;Severe aspiration risk   CHL IP TREATMENT RECOMMENDATION 08/22/2020 Treatment Recommendations Therapy as outlined in treatment plan below   Prognosis 08/22/2020 Prognosis for Safe Diet Advancement (No Data) Barriers to Reach Goals Severity of deficits Barriers/Prognosis Comment -- CHL IP DIET RECOMMENDATION 08/22/2020 SLP Diet Recommendations NPO Liquid Administration via -- Medication Administration Via alternative means Compensations -- Postural Changes --   CHL IP OTHER RECOMMENDATIONS 08/22/2020 Recommended Consults -- Oral Care Recommendations Oral care QID Other Recommendations --   CHL IP FOLLOW UP RECOMMENDATIONS 08/22/2020 Follow up Recommendations Skilled Nursing facility   [facility] IP FREQUENCY AND DURATION 08/22/2020 Speech Therapy Frequency (ACUTE ONLY) min 2x/week Treatment Duration 2 weeks      CHL IP ORAL PHASE 08/22/2020 Oral Phase Impaired Oral - Pudding Teaspoon -- Oral - Pudding Cup -- Oral - Honey Teaspoon Decreased bolus cohesion;Delayed oral transit;Lingual/palatal residue;Right pocketing in lateral sulci;Right anterior bolus loss Oral - Honey Cup Decreased bolus cohesion;Delayed oral transit;Lingual/palatal residue;Right pocketing in lateral sulci;Right anterior bolus loss Oral - Nectar Teaspoon Decreased bolus cohesion;Delayed oral transit;Lingual/palatal residue;Right pocketing in lateral sulci;Right anterior bolus loss Oral - Nectar Cup NT Oral - Nectar Straw -- Oral - Thin Teaspoon -- Oral -  Thin Cup -- Oral - Thin Straw -- Oral - Puree Decreased bolus cohesion;Delayed oral transit;Lingual/palatal residue;Right pocketing in lateral  sulci Oral - Mech Soft -- Oral - Regular -- Oral - Multi-Consistency -- Oral - Pill -- Oral Phase - Comment --  CHL IP PHARYNGEAL PHASE 08/22/2020 Pharyngeal Phase Impaired Pharyngeal- Pudding Teaspoon -- Pharyngeal -- Pharyngeal- Pudding Cup -- Pharyngeal -- Pharyngeal- Honey Teaspoon Delayed swallow initiation-vallecula;Pharyngeal residue - valleculae;Pharyngeal residue - pyriform Pharyngeal -- Pharyngeal- Honey Cup Delayed swallow initiation-vallecula;Pharyngeal residue - valleculae;Pharyngeal residue - pyriform Pharyngeal -- Pharyngeal- Nectar Teaspoon Penetration/Aspiration during swallow;Delayed swallow initiation-vallecula;Pharyngeal residue - valleculae Pharyngeal Material enters airway, CONTACTS cords and not ejected out Pharyngeal- Nectar Cup NT Pharyngeal -- Pharyngeal- Nectar Straw -- Pharyngeal -- Pharyngeal- Thin Teaspoon -- Pharyngeal -- Pharyngeal- Thin Cup -- Pharyngeal -- Pharyngeal- Thin Straw -- Pharyngeal -- Pharyngeal- Puree Delayed swallow initiation-vallecula;Pharyngeal residue - valleculae Pharyngeal -- Pharyngeal- Mechanical Soft -- Pharyngeal -- Pharyngeal- Regular -- Pharyngeal -- Pharyngeal- Multi-consistency -- Pharyngeal -- Pharyngeal- Pill -- Pharyngeal -- Pharyngeal Comment --  CHL IP CERVICAL ESOPHAGEAL PHASE 08/22/2020 Cervical Esophageal Phase WFL Pudding Teaspoon -- Pudding Cup -- Honey Teaspoon -- Honey Cup -- Nectar Teaspoon -- Nectar Cup -- Nectar Straw -- Thin Teaspoon -- Thin Cup -- Thin Straw -- Puree -- Mechanical Soft -- Regular -- Multi-consistency -- Pill -- Cervical Esophageal Comment -- [name] 08/22/2020, 2:58 PM [name] Litaker M.Ed Actor Pager [phone] Office [phone]              IR PERCUTANEOUS ART THROMBECTOMY/INFUSION INTRACRANIAL INC DIAG ANGIO  Result Date: 08/21/2020 INDICATION: New onset right-sided weakness with global aphasia. Occluded superior division of the left middle cerebral artery M2 segment.  EXAM: 1. EMERGENT LARGE VESSEL OCCLUSION THROMBOLYSIS (anterior CIRCULATION) COMPARISON:  CT angio the head and neck of August 18, 2020. MEDICATIONS: Ancef 2 g IV antibiotic was administered within 1 hour of the procedure. ANESTHESIA/SEDATION: General anesthesia CONTRAST:  Isovue 300 approximately 125 mL FLUOROSCOPY TIME:  Fluoroscopy Time: 62 minutes 30 seconds (789 mGy). COMPLICATIONS: None immediate. TECHNIQUE: Following a full explanation of the procedure along with the potential associated complications, an informed witnessed consent was obtained. The risks of intracranial hemorrhage of 10%, worsening neurological deficit, ventilator dependency, death and inability to revascularize were all reviewed in detail with the patient's daughter The patient was then put under general anesthesia by the Department of Anesthesiology at [facility]. The right groin was prepped and draped in the usual sterile fashion. Thereafter using modified Seldinger technique, transfemoral access into the right common femoral artery was obtained without difficulty. Over a 0.035 inch guidewire an 8 French 25 cm Pinnacle sheath was inserted. Through this, and also over a 0.035 inch guidewire a [place] JB 1 catheter was advanced to the aortic arch region and selectively positioned in the left common carotid artery and the left vertebral artery. FINDINGS: The left common carotid arteriogram demonstrates a severe tortuosity in its proximal 1/3. More distally the left external carotid artery and its branches opacify adequately. The left internal carotid artery at the bulb to the cranial skull base opacifies widely. The petrous, the cavernous and the supraclinoid segments are widely patent. The left posterior communicating artery is seen opacifying the left posterior cerebral artery distribution transiently. The left middle cerebral artery opacifies proximally in the M1 segment and also the inferior division. Occlusion of the superior  division in the proximal M2 segment is noted. The left anterior cerebral artery opacifies into the  capillary and venous phases. The delayed arterial phase demonstrates retrograde opacification of the left middle cerebral artery superior division territory from the distal collaterals arising from the pericallosal and the callosal marginal branches of the left anterior cerebral artery. Prompt opacification via the anterior communicating artery of the right anterior cerebral A2 segment and distally is noted. The left vertebral artery origin is widely patent. Moderate tortuosity noted of the proximal left vertebral artery just distal to its origin. More distally the vessel opacifies to the cranial skull base. The left vertebrobasilar junction, the left posterior-inferior cerebellar artery, and the opacified basilar artery, the left posterior cerebral artery and the superior cerebellar artery demonstrate patency into the delayed venous phase. Unopacified blood is noted in the basilar artery from the contralateral vertebral artery. PROCEDURE: Over a 0.035 inch Roadrunner guidewire, and then an 035 inch regular Glidewire, and an 035 inch Rosen exchange guidewire multiple attempts were made to advance initially a JB 1 catheter, and then a select Penumbra Simmons 2 catheter, a regular Simmons 2 catheter, a Simmons 3 diagnostic catheter, and finally a Simmons 2 6 French 90 cm Envoy guide catheter. Through each of these, initially an 027 160 cm Marksman microcatheter, duo 156 cm Headway microcatheter, an 035 136 cm catheter were advanced over multiple 014, and 016 micro guidewires to gain distal access to the left middle cerebral artery without success. This was primarily due to the severe tortuosity in the proximal left common carotid artery, in addition to severely distorted anatomy of the brachiocephalic origins of the aortic arch region. Following multiple failed attempts at gaining access more distally, the procedure was  stopped. A final control arteriogram performed through the proximal left common carotid artery continued to demonstrate occlusion of the superior division M2 segment of the left middle cerebral artery. The 8 French Pinnacle sheath was removed and manual compression was applied for hemostasis over about 30 minutes. The right groin appeared soft. Distal pulses continued to be Dopplerable in both feet unchanged. The patient's general anesthesia was then reversed. The patient was able to breathe on her own. However, she remained paralyzed in the right upper and lower extremity, with expressive aphasia. Patient was then transferred to the PACU and then the neuro ICU for post attempted revascularization. IMPRESSION: Failed endovascular revascularization of the left middle cerebral artery superior division M2 segment as described above. PLAN: As per referring MD. Electronically Signed   By: Luanne Bras M.D.   On: 08/19/2020 10:49   CT HEAD CODE STROKE WO CONTRAST  Addendum Date: 08/17/2020   ADDENDUM REPORT: 09/05/2020 14:03 ADDENDUM: Question of hyperdensity along the left M2 MCA in the sylvian fissure. Electronically Signed   By: Macy Mis M.D.   On: 08/15/2020 14:03   Result Date: 08/19/2020 CLINICAL DATA:  Right facial droop EXAM: CT HEAD WITHOUT CONTRAST TECHNIQUE: Contiguous axial images were obtained from the base of the skull through the vertex without intravenous contrast. COMPARISON:  None. FINDINGS: Brain: There is no acute intracranial hemorrhage or mass effect. Gray-white differentiation is preserved. There are new infarcts involving the left greater than right cerebellum and left basal ganglia and adjacent white matter. Additional patchy and confluent areas of hypoattenuation in the supratentorial white matter are nonspecific but may reflect mild to moderate chronic microvascular ischemic changes. Chronic bilateral basal ganglia infarcts and/or perivascular spaces. Prominence of the  ventricles and sulci reflects generalized parenchymal volume loss without substantial progression. No extra-axial fluid collection. Vascular: No hyperdense vessel. There is intracranial atherosclerotic calcification  at the skull base. Skull: Unremarkable. Sinuses/Orbits: No acute abnormality. Other: Mastoid air cells are clear. IMPRESSION: No acute intracranial hemorrhage or mass effect. New age-indeterminate small vessel infarcts involving left greater than right cerebellum and left basal ganglia and adjacent white matter. These results were called by telephone at the time of interpretation on 08/23/2020 at 12:36 pm to provider [facility] , who verbally acknowledged these results. Electronically Signed: By: Macy Mis M.D. On: 08/11/2020 12:41   VAS US CAROTID  Result Date: 08/15/2020 Carotid Arterial Duplex Study Indications:       Syncope. Risk Factors:      Hypertension, hyperlipidemia, prior CVA. Comparison Study:  no prior Performing Technologist: Abram Sander RVS  Examination Guidelines: A complete evaluation includes B-mode imaging, spectral Doppler, color Doppler, and power Doppler as needed of all accessible portions of each vessel. Bilateral testing is considered an integral part of a complete examination. Limited examinations for reoccurring indications may be performed as noted.  Right Carotid Findings: +----------+--------+--------+--------+------------------+--------+           PSV cm/sEDV cm/sStenosisPlaque DescriptionComments +----------+--------+--------+--------+------------------+--------+ CCA Prox  65      13              heterogenous               +----------+--------+--------+--------+------------------+--------+ CCA Distal63      17              heterogenous               +----------+--------+--------+--------+------------------+--------+ ICA Prox  59      10      1-39%   heterogenous                +----------+--------+--------+--------+------------------+--------+ ICA Distal69      17                                         +----------+--------+--------+--------+------------------+--------+ ECA       82                                                 +----------+--------+--------+--------+------------------+--------+ +----------+--------+-------+--------+-------------------+           PSV cm/sEDV cmsDescribeArm Pressure (mmHG) +----------+--------+-------+--------+-------------------+ [id number]                                         +----------+--------+-------+--------+-------------------+ +---------+--------+--+--------+--+---------+ VertebralPSV cm/s63EDV cm/s14Antegrade +---------+--------+--+--------+--+---------+  Left Carotid Findings: +----------+--------+--------+--------+------------------+--------+           PSV cm/sEDV cm/sStenosisPlaque DescriptionComments +----------+--------+--------+--------+------------------+--------+ CCA Prox  99      19              heterogenous               +----------+--------+--------+--------+------------------+--------+ CCA Distal65      11              heterogenous               +----------+--------+--------+--------+------------------+--------+ ICA Prox  52      21      1-39%   heterogenous      tortuous +----------+--------+--------+--------+------------------+--------+ ICA Distal77      22                                         +----------+--------+--------+--------+------------------+--------+  ECA       91                                                 +----------+--------+--------+--------+------------------+--------+ +----------+--------+--------+--------+-------------------+           PSV cm/sEDV cm/sDescribeArm Pressure (mmHG) +----------+--------+--------+--------+-------------------+ [id number]                                           +----------+--------+--------+--------+-------------------+ +---------+--------+---+--------+--+---------+ VertebralPSV cm/s103EDV cm/s13Antegrade +---------+--------+---+--------+--+---------+   Summary: Right Carotid: Velocities in the right ICA are consistent with a 1-39% stenosis. Left Carotid: Velocities in the left ICA are consistent with a 1-39% stenosis. Vertebrals: Bilateral vertebral arteries demonstrate antegrade flow. *See table(s) above for measurements and observations.  Electronically signed by Ruta Hinds MD on 08/15/2020 at 5:03:40 PM.    Final    CT ANGIO HEAD CODE STROKE  Result Date: 08/12/2020 CLINICAL DATA:  Neuro deficit, acute, stroke suspected. Additional history provided: Right-sided facial droop, slurred speech. EXAM: CT ANGIOGRAPHY HEAD AND NECK CT PERFUSION BRAIN TECHNIQUE: Multidetector CT imaging of the head and neck was performed using the standard protocol during bolus administration of intravenous contrast. Multiplanar CT image reconstructions and MIPs were obtained to evaluate the vascular anatomy. Carotid stenosis measurements (when applicable) are obtained utilizing NASCET criteria, using the distal internal carotid diameter as the denominator. Multiphase CT imaging of the brain was performed following IV bolus contrast injection. Subsequent parametric perfusion maps were calculated using RAPID software. CONTRAST:  58m OMNIPAQUE IOHEXOL 350 MG/ML SOLN COMPARISON:  Noncontrast head CT performed earlier today 08/26/2020. FINDINGS: CTA NECK FINDINGS Aortic arch: The left vertebral artery arises directly from the aortic arch. Atherosclerotic plaque within the visualized aortic arch and proximal major branch vessels of the neck. No hemodynamically significant innominate or proximal subclavian artery stenosis. Right carotid system: CCA and ICA patent within the neck without stenosis. Mild calcified plaque within the carotid bifurcation and proximal ICA. Left carotid  system: CCA and ICA patent within the neck without significant stenosis (50% or greater). Moderate calcified plaque within the carotid bifurcation and proximal ICA. Vertebral arteries: Vertebral arteries codominant and patent within the neck without stenosis. Nonstenotic calcified plaque at the origin of the left vertebral artery. Skeleton: No acute bony abnormality or aggressive osseous lesion. Cervical spondylosis with multilevel disc space narrowing, disc bulges, posterior disc osteophytes, uncovertebral hypertrophy and facet arthrosis. Suspected degenerative fusion across the disc spaces at C4-C5 and C6-C7. Grade 1 anterolisthesis at C3-C4, C4-C5, C5-C6. Other neck: No pathologically enlarged cervical chain lymph nodes. Multiple thyroid nodules, the largest within the left lobe measuring 2.3 cm (series 7, image 110). Upper chest: No consolidation within the imaged lung apices. Review of the MIP images confirms the above findings CTA HEAD FINDINGS Anterior circulation: The intracranial internal carotid arteries are patent. Calcified plaque within both vessels with no more than mild stenosis. The M1 middle cerebral arteries are patent. Occlusion of a superior division proximal M2 left MCA vessel (series 11, image 23) (series 9, image 45). The A1 right anterior cerebral artery is markedly hypoplastic or absent on a developmental basis. The anterior cerebral arteries are otherwise patent. No intracranial aneurysm is identified. Posterior circulation: The intracranial vertebral arteries are patent. Nonstenotic calcified plaque within the left V4  segment. The basilar artery is patent. The posterior cerebral arteries are patent. Posterior communicating arteries are present bilaterally. Venous sinuses: Within the limitations of contrast timing, no convincing thrombus. Anatomic variants: As described Review of the MIP images confirms the above findings CT Brain Perfusion Findings: The perfusion software detects  suboptimal contrast bolus timing and/or excessive motion, limiting the reliability of the perfusion data. CBF (<30%) Volume: 38m Perfusion (Tmax>6.0s) volume: 169m(predominantly within the left MCA vascular territory). Mismatch Volume: 1971mnfarction Location:None identified Emergent findings were called by telephone at the time of interpretation on 09/04/2020 at 1:54 pm to provider Dr. HavGilford Raidho verbally acknowledged these results. IMPRESSION: CTA neck: 1. The common carotid, internal carotid and vertebral arteries are patent within the neck without hemodynamically significant stenosis. Atherosclerotic disease within these vessels as described. Most notably, there is moderate calcified plaque within the left carotid bifurcation and proximal ICA. 2. Multiple thyroid nodules, the largest within the left lobe measuring 2.3 cm. Nonemergent thyroid ultrasound is recommended for further evaluation. CTA head: 1. Occlusion of a superior division proximal M2 left MCA vessel. Emergent neuro-interventional consultation is recommended. 2. Calcified plaque within the intracranial ICAs with no more than mild resultant stenosis. CT perfusion head: 1. The perfusion software detects suboptimal contrast bolus timing and/or excessive motion, limiting the reliability of the perfusion data. 2. The perfusion software identifies a 19 mL region of hypoperfused parenchyma predominantly within the left MCA vascular territory. No core infarct is detected. Electronically Signed   By: KylKellie Simmering   On: 08/23/2020 14:15   CT ANGIO NECK CODE STROKE  Result Date: 08/12/2020 CLINICAL DATA:  Neuro deficit, acute, stroke suspected. Additional history provided: Right-sided facial droop, slurred speech. EXAM: CT ANGIOGRAPHY HEAD AND NECK CT PERFUSION BRAIN TECHNIQUE: Multidetector CT imaging of the head and neck was performed using the standard protocol during bolus administration of intravenous contrast. Multiplanar CT image  reconstructions and MIPs were obtained to evaluate the vascular anatomy. Carotid stenosis measurements (when applicable) are obtained utilizing NASCET criteria, using the distal internal carotid diameter as the denominator. Multiphase CT imaging of the brain was performed following IV bolus contrast injection. Subsequent parametric perfusion maps were calculated using RAPID software. CONTRAST:  2m71mNIPAQUE IOHEXOL 350 MG/ML SOLN COMPARISON:  Noncontrast head CT performed earlier today 08/23/2020. FINDINGS: CTA NECK FINDINGS Aortic arch: The left vertebral artery arises directly from the aortic arch. Atherosclerotic plaque within the visualized aortic arch and proximal major branch vessels of the neck. No hemodynamically significant innominate or proximal subclavian artery stenosis. Right carotid system: CCA and ICA patent within the neck without stenosis. Mild calcified plaque within the carotid bifurcation and proximal ICA. Left carotid system: CCA and ICA patent within the neck without significant stenosis (50% or greater). Moderate calcified plaque within the carotid bifurcation and proximal ICA. Vertebral arteries: Vertebral arteries codominant and patent within the neck without stenosis. Nonstenotic calcified plaque at the origin of the left vertebral artery. Skeleton: No acute bony abnormality or aggressive osseous lesion. Cervical spondylosis with multilevel disc space narrowing, disc bulges, posterior disc osteophytes, uncovertebral hypertrophy and facet arthrosis. Suspected degenerative fusion across the disc spaces at C4-C5 and C6-C7. Grade 1 anterolisthesis at C3-C4, C4-C5, C5-C6. Other neck: No pathologically enlarged cervical chain lymph nodes. Multiple thyroid nodules, the largest within the left lobe measuring 2.3 cm (series 7, image 110). Upper chest: No consolidation within the imaged lung apices. Review of the MIP images confirms the above findings CTA HEAD FINDINGS Anterior circulation: The  intracranial internal carotid arteries are patent. Calcified plaque within both vessels with no more than mild stenosis. The M1 middle cerebral arteries are patent. Occlusion of a superior division proximal M2 left MCA vessel (series 11, image 23) (series 9, image 45). The A1 right anterior cerebral artery is markedly hypoplastic or absent on a developmental basis. The anterior cerebral arteries are otherwise patent. No intracranial aneurysm is identified. Posterior circulation: The intracranial vertebral arteries are patent. Nonstenotic calcified plaque within the left V4 segment. The basilar artery is patent. The posterior cerebral arteries are patent. Posterior communicating arteries are present bilaterally. Venous sinuses: Within the limitations of contrast timing, no convincing thrombus. Anatomic variants: As described Review of the MIP images confirms the above findings CT Brain Perfusion Findings: The perfusion software detects suboptimal contrast bolus timing and/or excessive motion, limiting the reliability of the perfusion data. CBF (<30%) Volume: 21m Perfusion (Tmax>6.0s) volume: 124m(predominantly within the left MCA vascular territory). Mismatch Volume: [id number] Location:None identified Emergent findings were called by telephone at the time of interpretation on 08/24/2020 at 1:54 pm to provider Dr. HavGilford Raidho verbally acknowledged these results. IMPRESSION: CTA neck: 1. The common carotid, internal carotid and vertebral arteries are patent within the neck without hemodynamically significant stenosis. Atherosclerotic disease within these vessels as described. Most notably, there is moderate calcified plaque within the left carotid bifurcation and proximal ICA. 2. Multiple thyroid nodules, the largest within the left lobe measuring 2.3 cm. Nonemergent thyroid ultrasound is recommended for further evaluation. CTA head: 1. Occlusion of a superior division proximal M2 left MCA vessel. Emergent  neuro-interventional consultation is recommended. 2. Calcified plaque within the intracranial ICAs with no more than mild resultant stenosis. CT perfusion head: 1. The perfusion software detects suboptimal contrast bolus timing and/or excessive motion, limiting the reliability of the perfusion data. 2. The perfusion software identifies a 19 mL region of hypoperfused parenchyma predominantly within the left MCA vascular territory. No core infarct is detected. Electronically Signed   By: KylKellie Simmering   On: 08/14/2020 14:15   IR ANGIO VERTEBRAL SEL VERTEBRAL UNI L MOD SED  Result Date: 08/23/2020 Narrative & Impression INDICATION: New onset right-sided weakness with global aphasia. Occluded superior division of the left middle cerebral artery M2 segment.  EXAM: 1. EMERGENT LARGE VESSEL OCCLUSION THROMBOLYSIS (anterior CIRCULATION)  COMPARISON:  CT angio the head and neck of August 18, 2020.  MEDICATIONS: Ancef 2 g IV antibiotic was administered within 1 hour of the procedure.  ANESTHESIA/SEDATION: General anesthesia  CONTRAST:  Isovue 300 approximately 125 mL  FLUOROSCOPY TIME:  Fluoroscopy Time: 62 minutes 30 seconds (789 mGy).  COMPLICATIONS: None immediate.  TECHNIQUE: Following a full explanation of the procedure along with the potential associated complications, an informed witnessed consent was obtained. The risks of intracranial hemorrhage of 10%, worsening neurological deficit, ventilator dependency, death and inability to revascularize were all reviewed in detail with the patient's daughter  The patient was then put under general anesthesia [facility] was prepped and draped in the usual sterile fashion. Thereafter using modified Seldinger technique, transfemoral access into the right common femoral artery was obtained without difficulty. Over a 0.035 inch guidewire an 8 French 25 cm Pinnacle sheath was inserted. Through this,  and also over a 0.035 inch guidewire a [place] 1 catheter was advanced to the aortic arch region and selectively positioned in the left common carotid artery and the left vertebral artery.  FINDINGS: The left common carotid arteriogram demonstrates a severe tortuosity in its proximal 1/3. More distally the left external carotid artery and its branches opacify adequately.  The left internal carotid artery at the bulb to the cranial skull base opacifies widely. The petrous, the cavernous and the supraclinoid segments are widely patent.  The left posterior communicating artery is seen opacifying the left posterior cerebral artery distribution transiently.  The left middle cerebral artery opacifies proximally in the M1 segment and also the inferior division. Occlusion of the superior division in the proximal M2 segment is noted.  The left anterior cerebral artery opacifies into the capillary and venous phases.  The delayed arterial phase demonstrates retrograde opacification of the left middle cerebral artery superior division territory from the distal collaterals arising from the pericallosal and the callosal marginal branches of the left anterior cerebral artery.  Prompt opacification via the anterior communicating artery of the right anterior cerebral A2 segment and distally is noted.  The left vertebral artery origin is widely patent. Moderate tortuosity noted of the proximal left vertebral artery just distal to its origin. More distally the vessel opacifies to the cranial skull base.  The left vertebrobasilar junction, the left posterior-inferior cerebellar artery, and the opacified basilar artery, the left posterior cerebral artery and the superior cerebellar artery demonstrate patency into the delayed venous phase.  Unopacified blood is noted in the basilar artery from the contralateral vertebral artery.  PROCEDURE: Over a 0.035 inch Roadrunner guidewire, and then an 035 inch regular Glidewire, and  an 035 inch Rosen exchange guidewire multiple attempts were made to advance initially a JB 1 catheter, and then a select Penumbra Simmons 2 catheter, a regular Simmons 2 catheter, a Simmons 3 diagnostic catheter, and finally a Simmons 2 6 French 90 cm Envoy guide catheter.  Through each of these, initially an 027 160 cm Marksman microcatheter, duo 156 cm Headway microcatheter, an 035 136 cm catheter were advanced over multiple 014, and 016 micro guidewires to gain distal access to the left middle cerebral artery without success. This was primarily due to the severe tortuosity in the proximal left common carotid artery, in addition to severely distorted anatomy of the brachiocephalic origins of the aortic arch region.  Following multiple failed attempts at gaining access more distally, the procedure was stopped.  A final control arteriogram performed through the proximal left common carotid artery continued to demonstrate occlusion of the superior division M2 segment of the left middle cerebral artery.  The 8 French Pinnacle sheath was removed and manual compression was applied for hemostasis over about 30 minutes.  The right groin appeared soft. Distal pulses continued to be Dopplerable in both feet unchanged.  The patient's general anesthesia was then reversed. The patient was able to breathe on her own.  However, she remained paralyzed in the right upper and lower extremity, with expressive aphasia.  Patient was then transferred to the PACU and then the neuro ICU for post attempted revascularization.  IMPRESSION: Failed endovascular revascularization of the left middle cerebral artery superior division M2 segment as described above.  PLAN: As per referring MD.   Electronically Signed   By: Luanne Bras M.D.   On: 08/19/2020 10:49    Microbiology No results found for this or any previous visit (from the past 240 hour(s)).  Lab Basic Metabolic Panel: No results for input(s): NA, K, CL, CO2,  GLUCOSE, BUN, CREATININE, CALCIUM, MG, PHOS in the last 168 hours. Liver Function Tests: No results for input(s): AST,  ALT, ALKPHOS, BILITOT, PROT, ALBUMIN in the last 168 hours. No results for input(s): LIPASE, AMYLASE in the last 168 hours. No results for input(s): AMMONIA in the last 168 hours. CBC: No results for input(s): WBC, NEUTROABS, HGB, HCT, MCV, PLT in the last 168 hours. Cardiac Enzymes: No results for input(s): CKTOTAL, CKMB, CKMBINDEX, TROPONINI in the last 168 hours. Sepsis Labs: No results for input(s): PROCALCITON, WBC, LATICACIDVEN in the last 168 hours.  Procedures/Operations  Left MCA M2 thrombectomy - unsuccessful    Aerie Donica 09/15/2020, 2:20 PM

## 2020-10-11 DEATH — deceased

## 2021-01-11 ENCOUNTER — Ambulatory Visit: Payer: Medicare Other

## 2021-01-11 ENCOUNTER — Ambulatory Visit: Payer: Medicare Other | Admitting: Internal Medicine

## 2021-01-21 IMAGING — US US RENAL
1 series · 14 of 25 positions shown · non-contrast
Comparison: None.

CLINICAL DATA: Stage III B chronic kidney disease

EXAM:
RENAL / URINARY TRACT ULTRASOUND COMPLETE

[Series 1: us renal · 0.20mm/px · 14 of 45 slices shown]
[im 1/45]
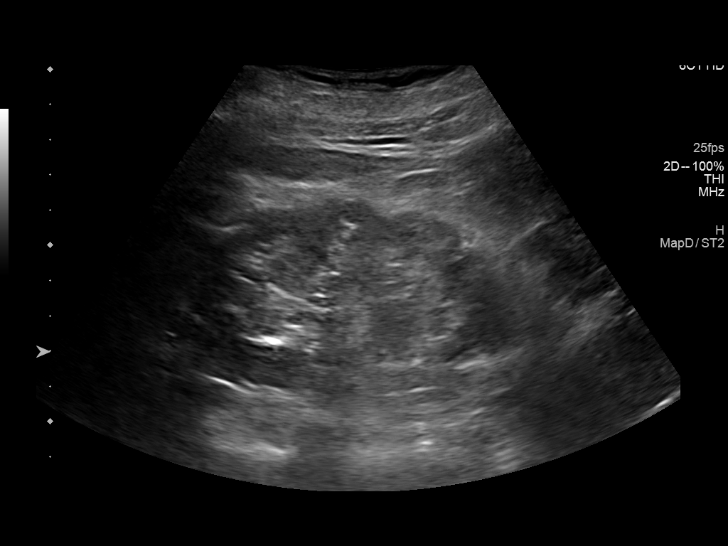
[im 4/45]
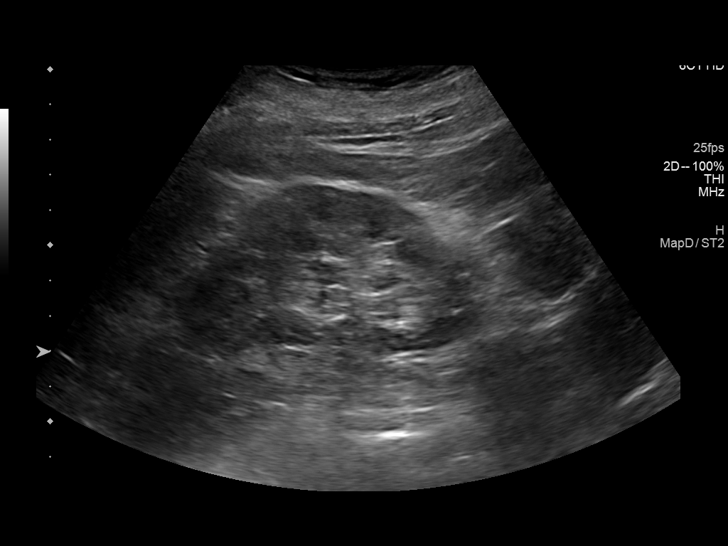
[im 8/45]
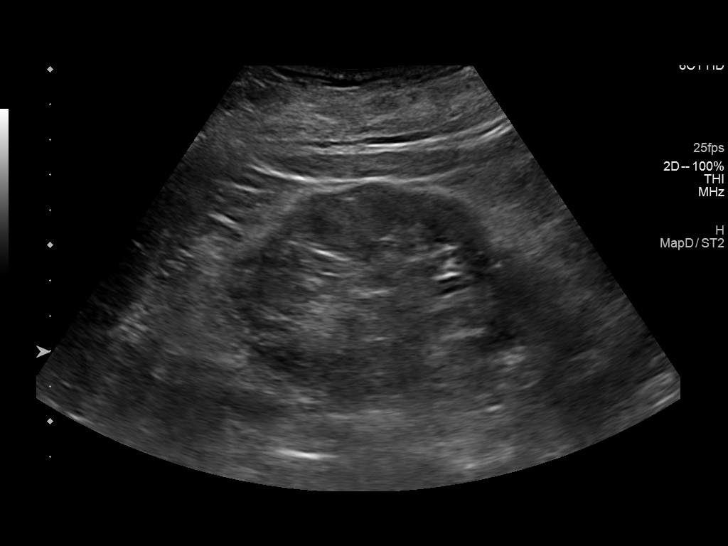
[im 12/45]
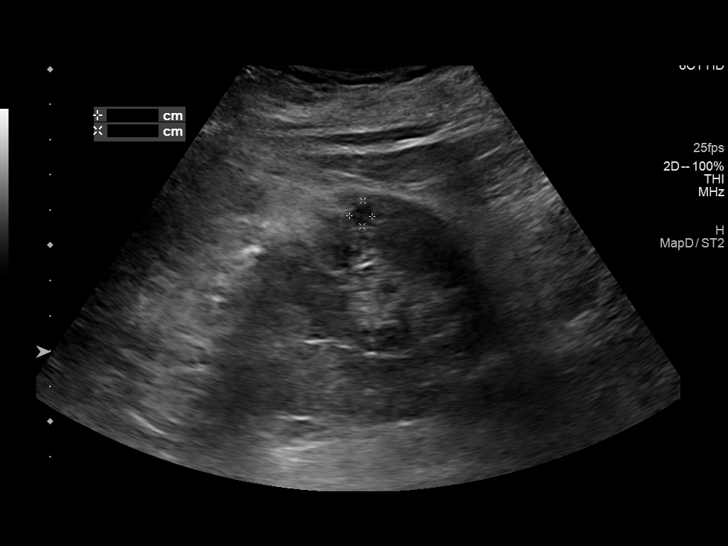
[im 15/45]
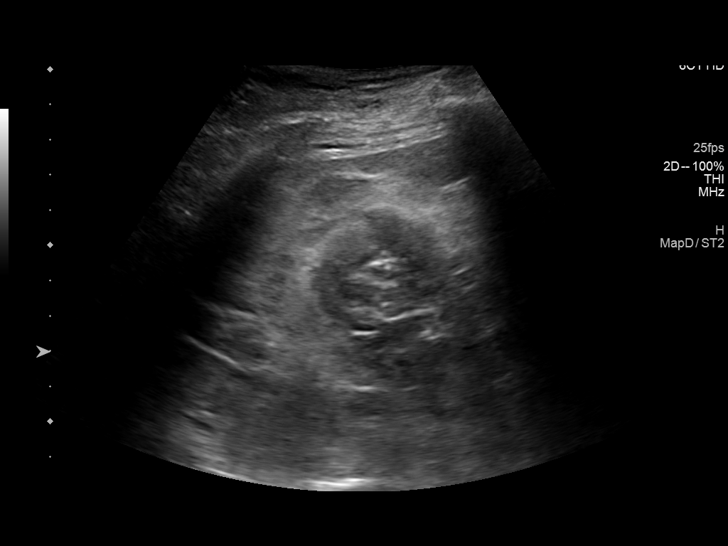
[im 17/45]
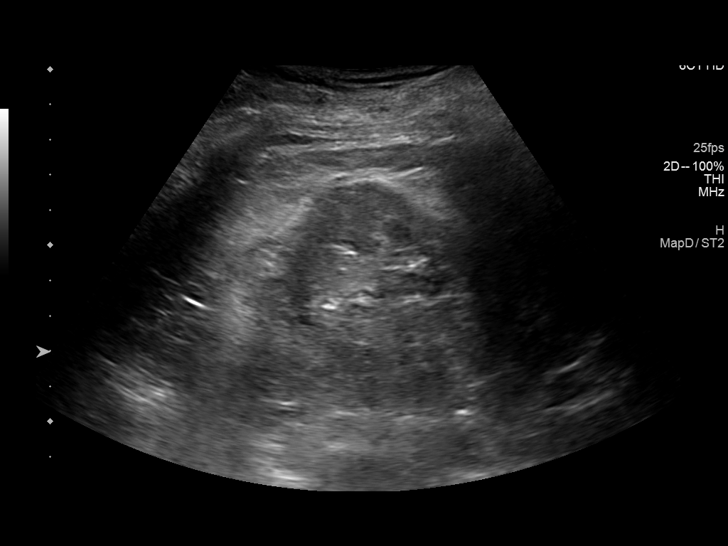
[im 21/45]
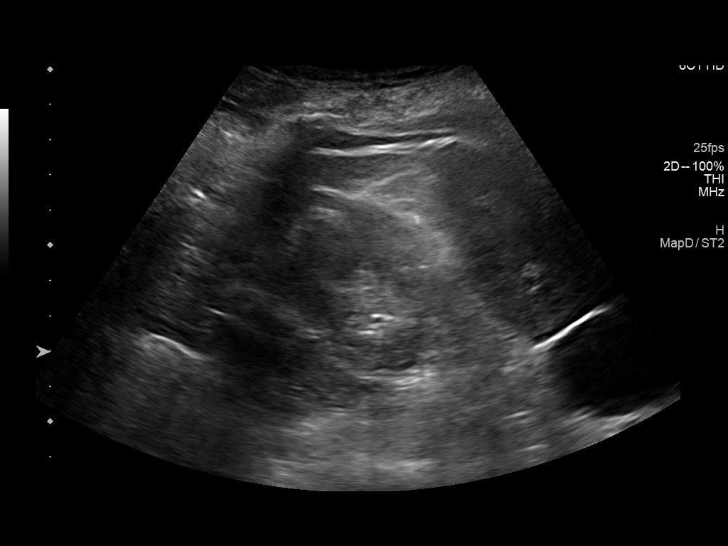
[im 24/45]
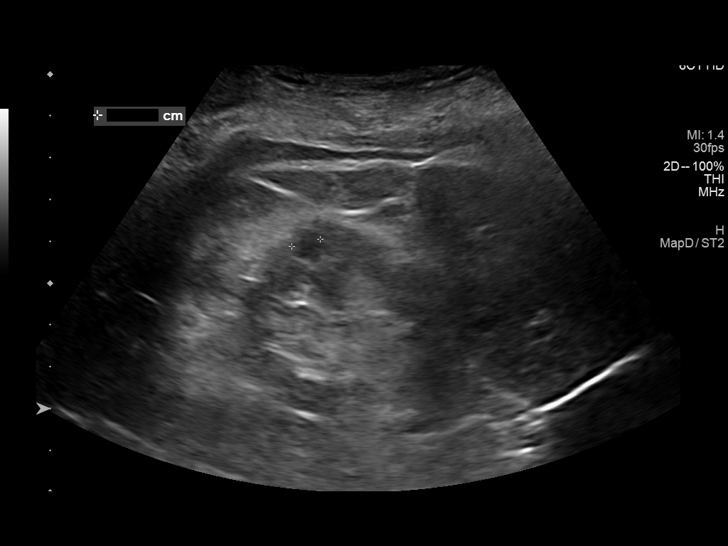
[im 28/45]
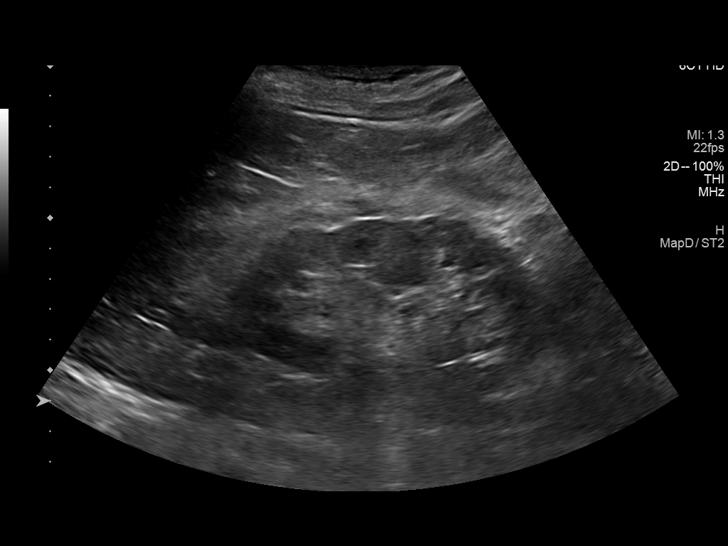
[im 30/45]
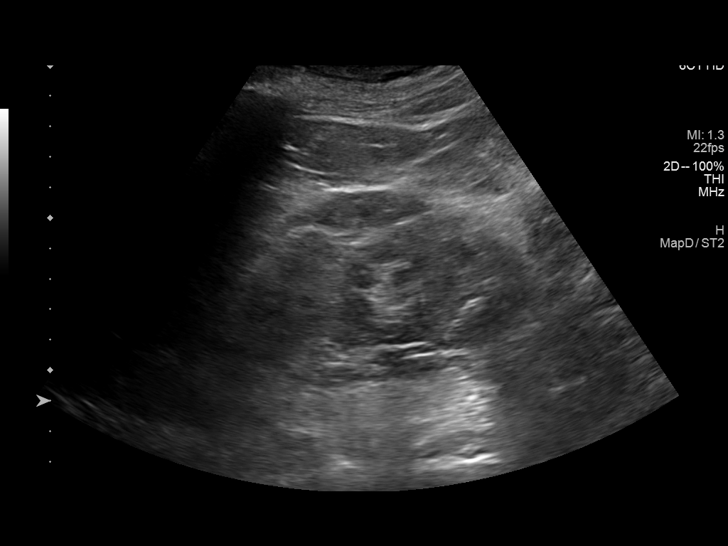
[im 34/45]
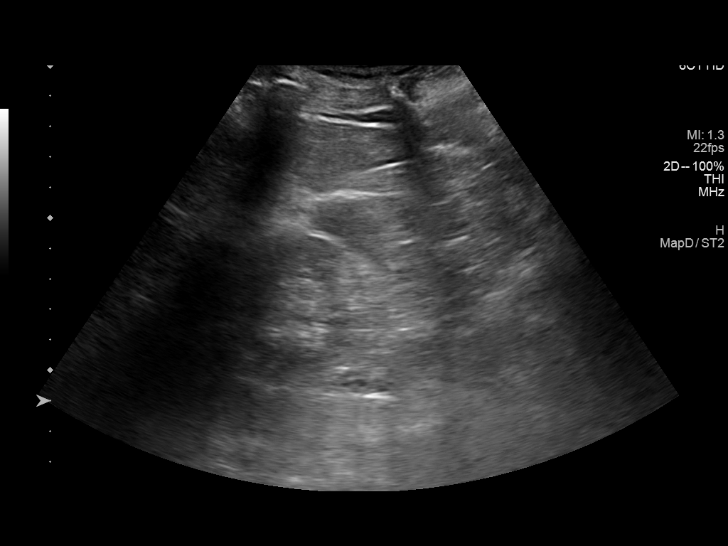
[im 37/45]
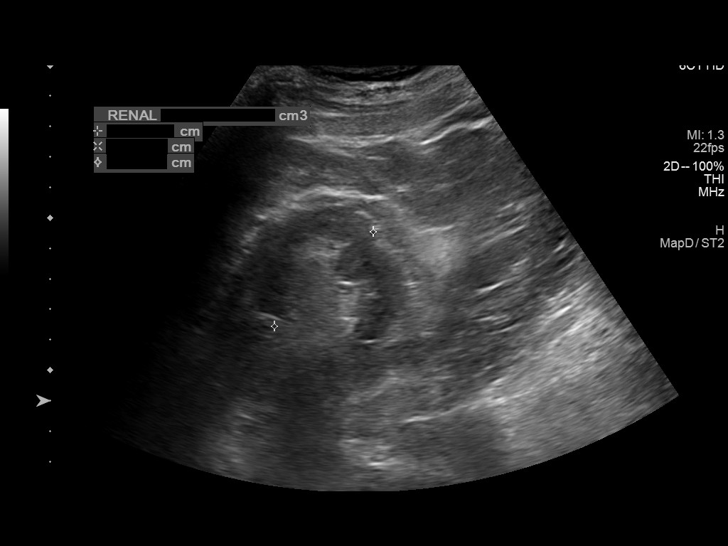
[im 41/45]
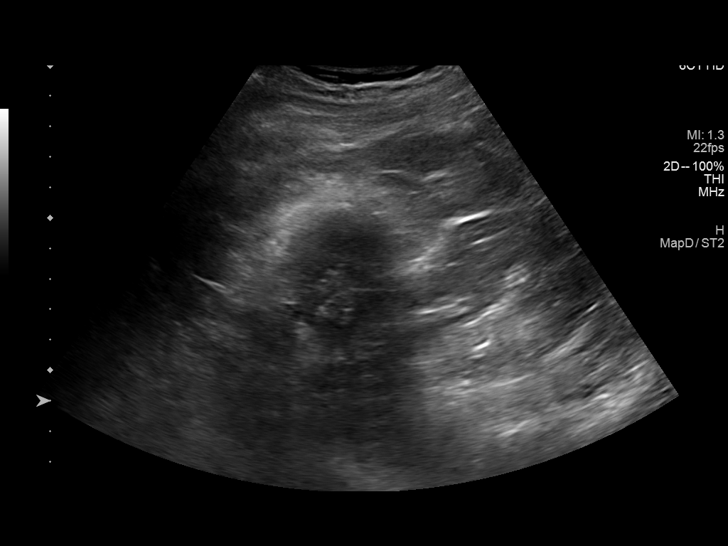
[im 45/45]
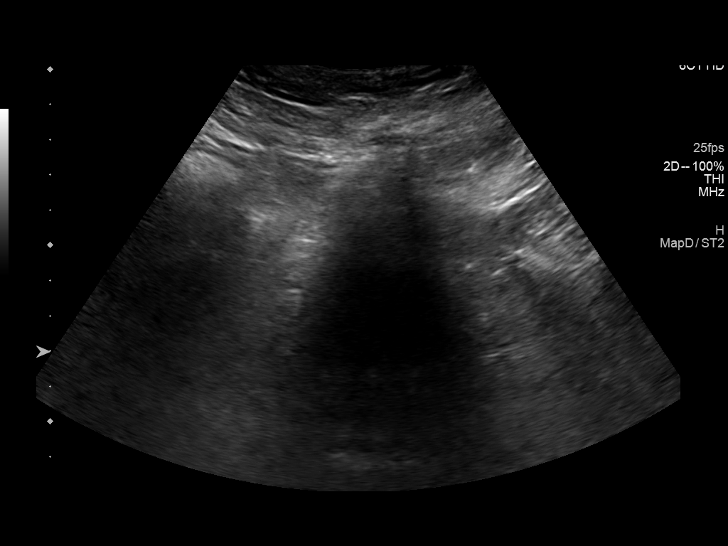

[14 of 25 positions shown; findings below may reference images not displayed]

FINDINGS: Right Kidney:

Renal measurements: 9.0 x 5.5 x 4.6 cm = volume: 120 mL .
Echogenicity within normal limits. Renal cortical thickness is
preserved. No mass or hydronephrosis visualized. 7 mm simple
cortical cyst is seen within the interpolar region of the right
kidney.

Left Kidney:

Renal measurements: 11.0 x 5.6 x 4.5 cm = volume: 146 mL.
Echogenicity within normal limits. Renal cortical thickness is
preserved. No mass or hydronephrosis visualized.

Bladder:

Is decompressed and is poorly visualized on this examination.

Other:

None.
IMPRESSION: Normal renal sonogram

## 2021-01-27 ENCOUNTER — Encounter (INDEPENDENT_AMBULATORY_CARE_PROVIDER_SITE_OTHER): Payer: Medicare Other | Admitting: Ophthalmology

## 2021-03-01 ENCOUNTER — Encounter: Payer: Medicare Other | Admitting: Internal Medicine

## 2021-03-09 ENCOUNTER — Encounter: Payer: Medicare Other | Admitting: Internal Medicine

## 2021-05-17 IMAGING — MR MR HEAD W/O CM
12 of 15 series · 42 of 48 positions shown · non-contrast
Comparison: Prior CTs from 08/18/2020.

CLINICAL DATA: Follow-up examination for acute stroke, right-sided
weakness and aphasia.

EXAM:
MRI HEAD WITHOUT CONTRAST
TECHNIQUE: Multiplanar, multiecho pulse sequences of the brain and surrounding
structures were obtained without intravenous contrast.

[Series 5: DWI · axial · 3.0mm · 0.88mm/px · z∈[-105,+38]mm · 7 of 100 slices shown (1 of 4)]
[im 1/100]
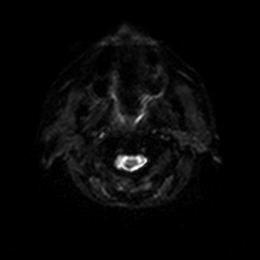
[im 17/100]
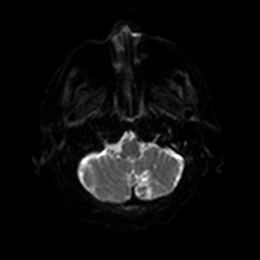
[im 34/100]
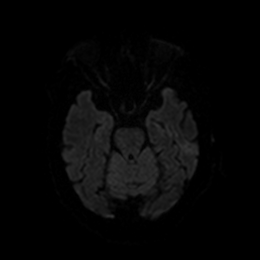
[im 50/100]
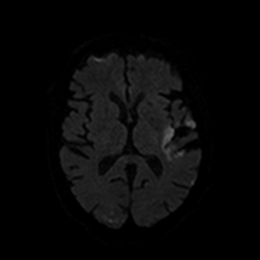
[im 67/100]
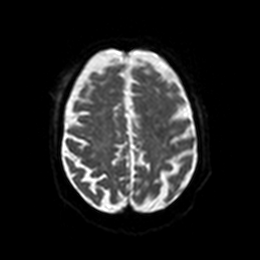
[im 83/100]
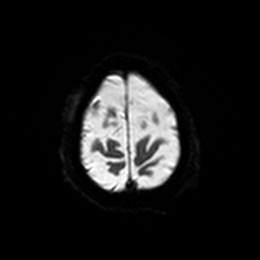
[im 100/100]
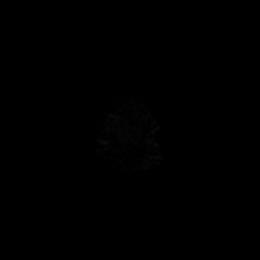

[Series 6: DWI · axial · 3.0mm · 0.88mm/px · z∈[-105,+38]mm · 4 of 50 slices shown (2 of 4)]
[im 1/50]
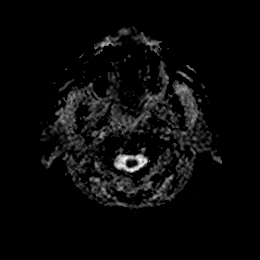
[im 17/50]
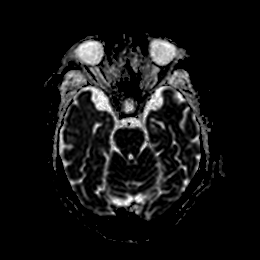
[im 33/50]
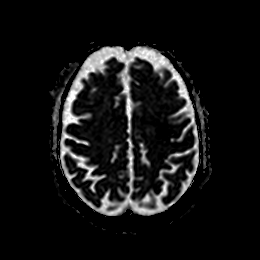
[im 50/50]
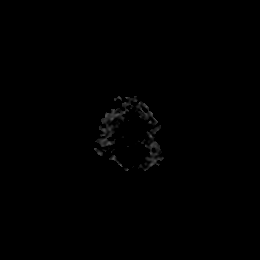

[Series 7: DWI · coronal · 4.0mm · 0.88mm/px · 5 of 64 slices shown (3 of 4)]
[im 1/64]
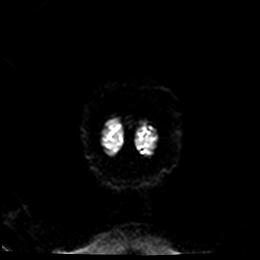
[im 16/64]
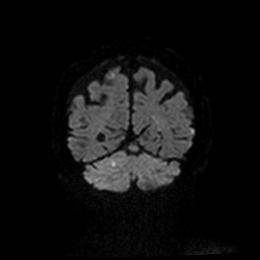
[im 32/64]
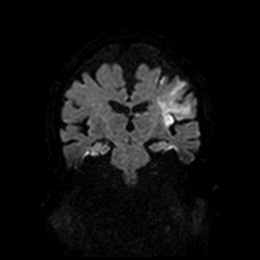
[im 48/64]
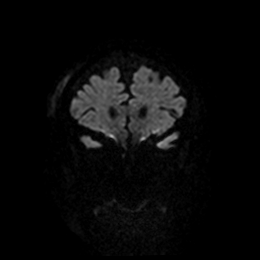
[im 64/64]
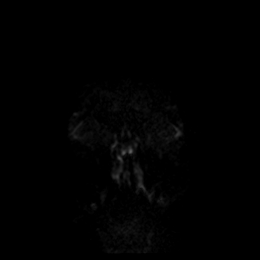

[Series 8: DWI · coronal · 4.0mm · 0.88mm/px · 2 of 32 slices shown (4 of 4)]
[im 1/32]
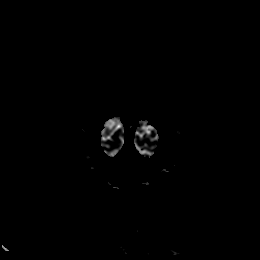
[im 32/32]
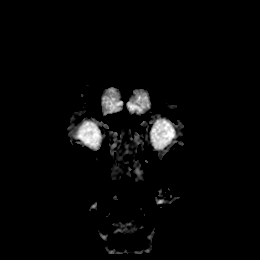

[Series 9: T1 · sagittal · 5.0mm · 0.75mm/px · 2 of 23 slices shown]
[im 1/23]
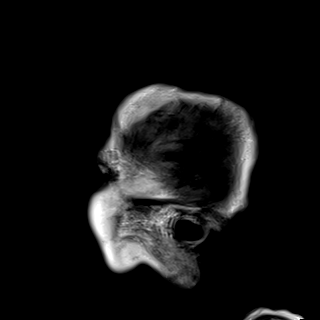
[im 23/23]
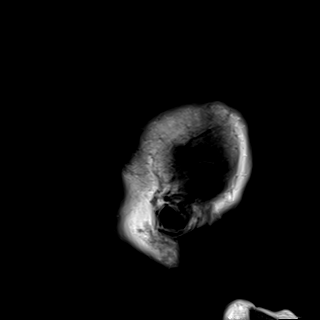

[Series 10: T2 · axial · 5.0mm · 0.72mm/px · z∈[-104,+37]mm · 2 of 25 slices shown (1 of 2)]
[im 1/25]
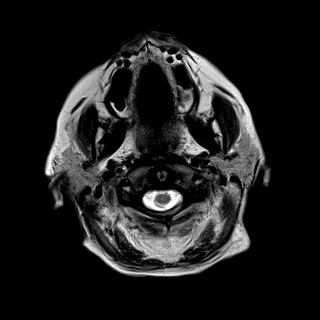
[im 25/25]
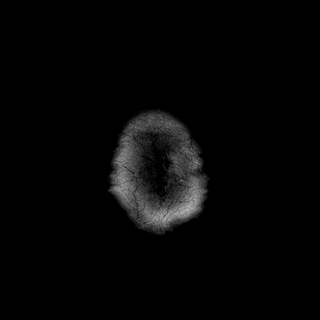

[Series 11: FLAIR · axial · 5.0mm · 0.45mm/px · z∈[-102,+38]mm · 2 of 25 slices shown]
[im 1/25]
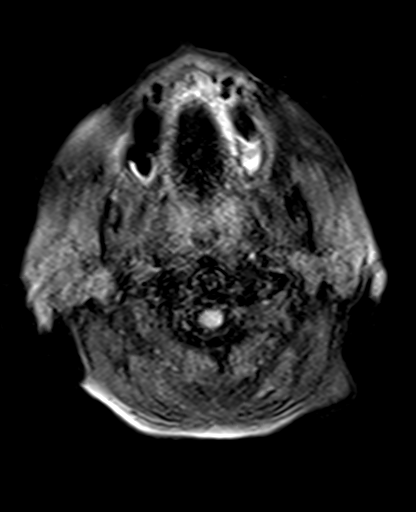
[im 25/25]
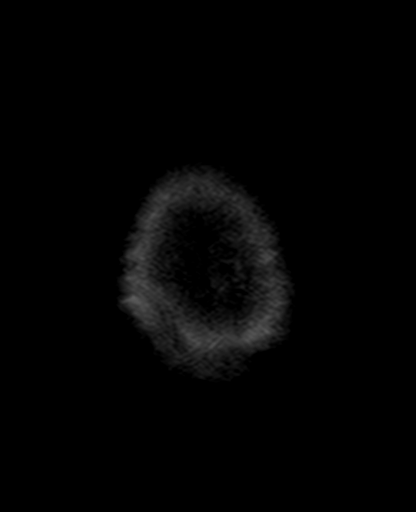

[Series 12: mag_images · axial · 3.0mm · 0.90mm/px · z∈[-113,+48]mm · 4 of 56 slices shown]
[im 1/56]
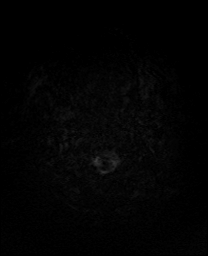
[im 19/56]
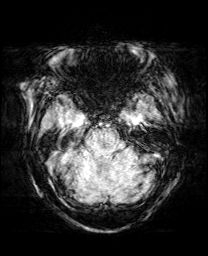
[im 37/56]
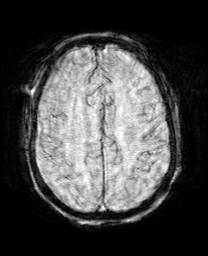
[im 56/56]
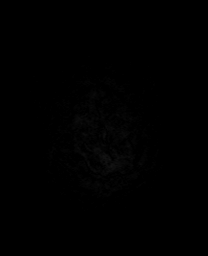

[Series 13: pha_images · axial · 3.0mm · 0.90mm/px · z∈[-113,+48]mm · 4 of 56 slices shown]
[im 1/56]
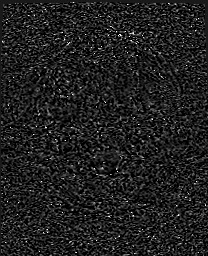
[im 19/56]
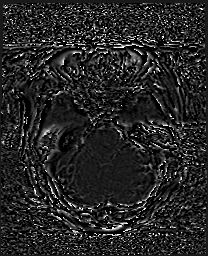
[im 37/56]
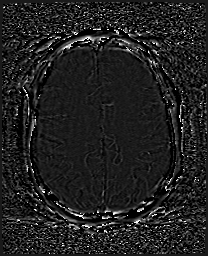
[im 56/56]
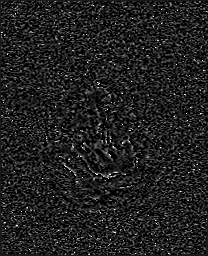

[Series 14: swi_images · axial · 3.0mm · 0.90mm/px · z∈[-113,+48]mm · 4 of 56 slices shown]
[im 1/56]
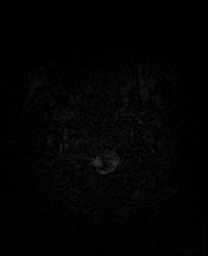
[im 19/56]
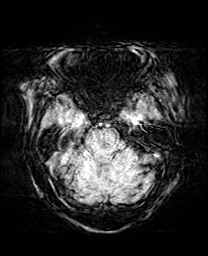
[im 37/56]
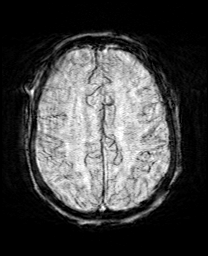
[im 56/56]
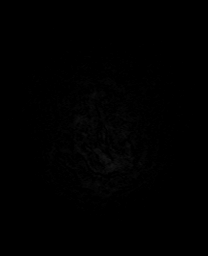

[Series 15: mip_images(sw) · axial · 24.0mm · 0.90mm/px · z∈[-102,+38]mm · 4 of 49 slices shown]
[im 1/49]
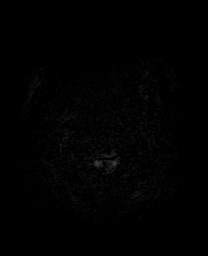
[im 17/49]
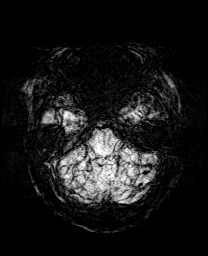
[im 33/49]
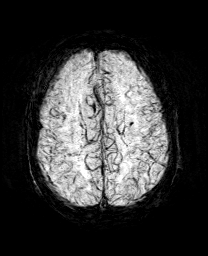
[im 49/49]
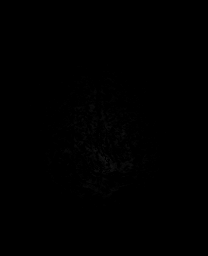

[Series 17: T2 · coronal · 5.0mm · 0.34mm/px · 2 of 29 slices shown (2 of 2)]
[im 1/29]
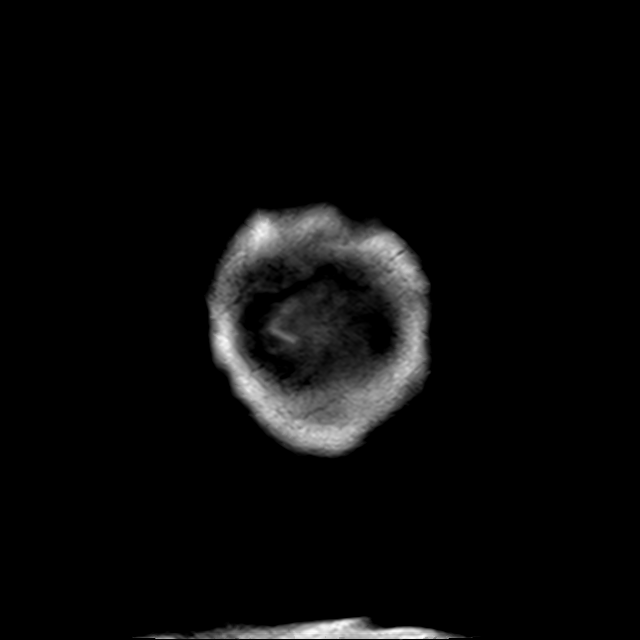
[im 29/29]
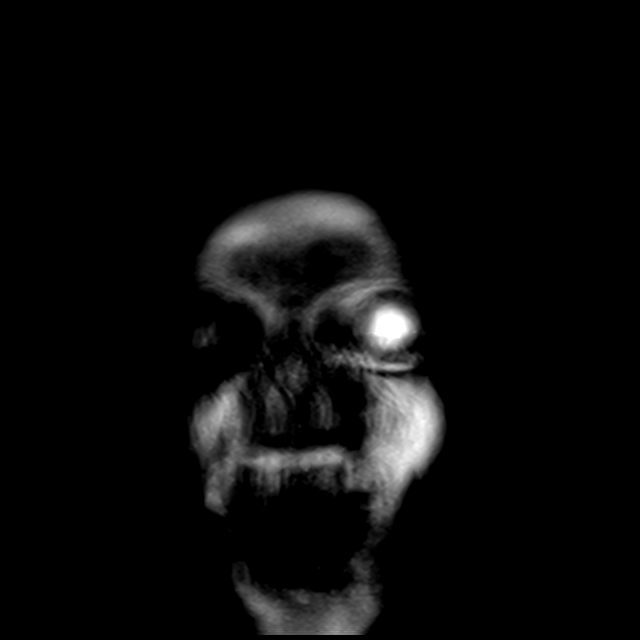

[42 of 48 positions shown; findings below may reference images not displayed]

FINDINGS: Brain: Examination degraded by motion artifact.

Diffuse prominence of the CSF containing spaces compatible with
generalized cerebral atrophy. Patchy and confluent T2/FLAIR
hyperintensity within the periventricular and deep white matter both
cerebral hemispheres consistent with chronic small vessel ischemic
disease, moderate in nature. Scattered remote lacunar infarcts noted
about the bilateral basal ganglia/internal capsules. Few scattered
remote lacunar infarcts about the midbrain, with additional
scattered remote bilateral cerebellar infarcts.

Confluent area of restricted diffusion involving the mid-posterior
left frontal region consistent with acute left MCA territory
infarct, corresponding with perfusion deficit seen on prior CT
perfusion. Suspected associated petechial hemorrhage without frank
hemorrhagic transformation, although evaluation somewhat limited due
to extensive SWI signal throughout the intracranial vasculature,
suspected be related to iron administration. No significant regional
mass effect.

Multiple additional scattered cortical and subcortical ischemic
infarcts seen involving the bilateral cerebral hemispheres, with
involvement of the frontal, parietal, and occipital lobes, left
slightly worse than right. These are predominantly subcentimeter in
nature. Patchy involvement of both pre and postcentral gyri noted.
Additional scattered subcentimeter ischemic infarcts noted involving
the right greater than left cerebellar hemispheres. No associated
hemorrhage or mass effect about these additional infarcts.

No mass lesion, midline shift or mass effect. No hydrocephalus or
extra-axial fluid collection. Pituitary gland and suprasellar region
within normal limits. Midline structures intact. Few additional
small chronic micro hemorrhages noted at the left centrum semi
ovale, left basal ganglia, and left cerebellum.

Vascular: Major intracranial vascular flow voids are maintained at
the skull base. Diffuse SWI signal with T1 hyperintensity throughout
the intracranial vasculature, likely related to iron administration.

Skull and upper cervical spine: Craniocervical junction normal. Bone
marrow signal intensity normal. There is an apparent evolving right
frontal scalp contusion (series 5, image 85).

Sinuses/Orbits: Globes and orbital soft tissues demonstrate no acute
finding. Patient status post bilateral ocular lens replacement. Mild
sphenoid sinus disease. Small bilateral mastoid effusions noted, of
doubtful significance. Inner ear structures grossly normal.

Other: None.
IMPRESSION: 1. Moderate-sized acute ischemic nonhemorrhagic left MCA territory
infarct. Suspected associated petechial hemorrhage without frank
hemorrhagic transformation or significant regional mass effect. Size
and distribution closely matches previously seen perfusion deficit.
2. Multiple additional scattered predominantly subcentimeter
cortical and subcortical ischemic infarcts involving the bilateral
cerebral and cerebellar hemispheres as above. No associated
hemorrhage or mass effect.
3. Underlying age-related cerebral atrophy with moderate chronic
small vessel ischemic disease, with multiple remote lacunar infarcts
about the deep gray nuclei, midbrain, and cerebellum.
4. Apparent evolving right frontal scalp contusion.
